# Patient Record
Sex: Female | Born: 1960
Health system: Southern US, Community
[De-identification: ages and names within clinical notes are randomized; demographics above are authoritative.]

## PROBLEM LIST (undated history)

## (undated) DIAGNOSIS — I10 Essential (primary) hypertension: Secondary | ICD-10-CM

## (undated) DIAGNOSIS — I351 Nonrheumatic aortic (valve) insufficiency: Secondary | ICD-10-CM

## (undated) DIAGNOSIS — M549 Dorsalgia, unspecified: Secondary | ICD-10-CM

## (undated) DIAGNOSIS — C801 Malignant (primary) neoplasm, unspecified: Secondary | ICD-10-CM

## (undated) DIAGNOSIS — G8929 Other chronic pain: Secondary | ICD-10-CM

## (undated) DIAGNOSIS — E785 Hyperlipidemia, unspecified: Secondary | ICD-10-CM

## (undated) DIAGNOSIS — R109 Unspecified abdominal pain: Secondary | ICD-10-CM

## (undated) DIAGNOSIS — F329 Major depressive disorder, single episode, unspecified: Secondary | ICD-10-CM

## (undated) DIAGNOSIS — I5189 Other ill-defined heart diseases: Secondary | ICD-10-CM

## (undated) DIAGNOSIS — J45909 Unspecified asthma, uncomplicated: Secondary | ICD-10-CM

## (undated) DIAGNOSIS — IMO0001 Reserved for inherently not codable concepts without codable children: Secondary | ICD-10-CM

## (undated) DIAGNOSIS — F32A Depression, unspecified: Secondary | ICD-10-CM

## (undated) DIAGNOSIS — M2550 Pain in unspecified joint: Secondary | ICD-10-CM

## (undated) DIAGNOSIS — K219 Gastro-esophageal reflux disease without esophagitis: Secondary | ICD-10-CM

## (undated) DIAGNOSIS — F419 Anxiety disorder, unspecified: Secondary | ICD-10-CM

## (undated) DIAGNOSIS — R002 Palpitations: Secondary | ICD-10-CM

## (undated) HISTORY — DX: Hyperlipidemia, unspecified: E78.5

## (undated) HISTORY — DX: Major depressive disorder, single episode, unspecified: F32.9

## (undated) HISTORY — DX: Essential (primary) hypertension: I10

## (undated) HISTORY — PX: LOBECTOMY: SHX5089

## (undated) HISTORY — DX: Nonrheumatic aortic (valve) insufficiency: I35.1

## (undated) HISTORY — PX: ABDOMINAL SURGERY: SHX537

## (undated) HISTORY — PX: TUBAL LIGATION: SHX77

## (undated) HISTORY — PX: ABDOMINAL HYSTERECTOMY: SHX81

## (undated) HISTORY — DX: Depression, unspecified: F32.A

## (undated) HISTORY — DX: Other ill-defined heart diseases: I51.89

## (undated) HISTORY — DX: Gastro-esophageal reflux disease without esophagitis: K21.9

## (undated) HISTORY — PX: COLONOSCOPY: SHX174

## (undated) HISTORY — PX: BLADDER SURGERY: SHX569

---

## 1995-01-19 DIAGNOSIS — C801 Malignant (primary) neoplasm, unspecified: Secondary | ICD-10-CM

## 1995-01-19 HISTORY — PX: LUNG CANCER SURGERY: SHX702

## 1995-01-19 HISTORY — DX: Malignant (primary) neoplasm, unspecified: C80.1

## 1995-10-19 ENCOUNTER — Encounter (INDEPENDENT_AMBULATORY_CARE_PROVIDER_SITE_OTHER): Payer: Self-pay | Admitting: *Deleted

## 1995-10-19 LAB — CONVERTED CEMR LAB

## 1997-05-17 ENCOUNTER — Encounter: Admission: RE | Admit: 1997-05-17 | Discharge: 1997-05-17 | Payer: Self-pay | Admitting: Family Medicine

## 1998-12-10 ENCOUNTER — Encounter: Admission: RE | Admit: 1998-12-10 | Discharge: 1998-12-10 | Payer: Self-pay | Admitting: Family Medicine

## 1999-04-02 ENCOUNTER — Encounter: Admission: RE | Admit: 1999-04-02 | Discharge: 1999-04-02 | Payer: Self-pay | Admitting: Sports Medicine

## 1999-04-02 ENCOUNTER — Encounter: Payer: Self-pay | Admitting: Sports Medicine

## 1999-04-20 ENCOUNTER — Encounter: Admission: RE | Admit: 1999-04-20 | Discharge: 1999-04-20 | Payer: Self-pay | Admitting: Family Medicine

## 1999-08-06 ENCOUNTER — Encounter: Admission: RE | Admit: 1999-08-06 | Discharge: 1999-08-06 | Payer: Self-pay | Admitting: *Deleted

## 1999-08-06 ENCOUNTER — Encounter: Admission: RE | Admit: 1999-08-06 | Discharge: 1999-08-06 | Payer: Self-pay | Admitting: Family Medicine

## 1999-08-06 ENCOUNTER — Encounter: Payer: Self-pay | Admitting: *Deleted

## 1999-10-08 ENCOUNTER — Encounter: Admission: RE | Admit: 1999-10-08 | Discharge: 1999-10-08 | Payer: Self-pay | Admitting: Family Medicine

## 1999-10-19 ENCOUNTER — Encounter: Admission: RE | Admit: 1999-10-19 | Discharge: 1999-10-19 | Payer: Self-pay | Admitting: Family Medicine

## 1999-10-22 ENCOUNTER — Encounter: Admission: RE | Admit: 1999-10-22 | Discharge: 1999-11-05 | Payer: Self-pay | Admitting: Sports Medicine

## 1999-11-20 ENCOUNTER — Encounter: Admission: RE | Admit: 1999-11-20 | Discharge: 1999-11-20 | Payer: Self-pay | Admitting: Family Medicine

## 1999-11-27 ENCOUNTER — Encounter: Admission: RE | Admit: 1999-11-27 | Discharge: 1999-11-27 | Payer: Self-pay | Admitting: Family Medicine

## 2000-05-25 ENCOUNTER — Encounter: Admission: RE | Admit: 2000-05-25 | Discharge: 2000-05-25 | Payer: Self-pay | Admitting: Family Medicine

## 2000-06-23 ENCOUNTER — Ambulatory Visit (HOSPITAL_COMMUNITY): Admission: RE | Admit: 2000-06-23 | Discharge: 2000-06-23 | Payer: Self-pay | Admitting: Urology

## 2000-08-09 ENCOUNTER — Ambulatory Visit (HOSPITAL_COMMUNITY): Admission: RE | Admit: 2000-08-09 | Discharge: 2000-08-09 | Payer: Self-pay | Admitting: Urology

## 2000-10-05 ENCOUNTER — Inpatient Hospital Stay (HOSPITAL_COMMUNITY): Admission: RE | Admit: 2000-10-05 | Discharge: 2000-10-06 | Payer: Self-pay | Admitting: Urology

## 2001-02-22 ENCOUNTER — Encounter: Admission: RE | Admit: 2001-02-22 | Discharge: 2001-02-22 | Payer: Self-pay | Admitting: Family Medicine

## 2001-03-27 ENCOUNTER — Observation Stay (HOSPITAL_COMMUNITY): Admission: RE | Admit: 2001-03-27 | Discharge: 2001-03-28 | Payer: Self-pay | Admitting: Urology

## 2001-12-24 ENCOUNTER — Emergency Department (HOSPITAL_COMMUNITY): Admission: EM | Admit: 2001-12-24 | Discharge: 2001-12-24 | Payer: Self-pay | Admitting: *Deleted

## 2002-01-27 ENCOUNTER — Encounter: Payer: Self-pay | Admitting: Emergency Medicine

## 2002-01-27 ENCOUNTER — Emergency Department (HOSPITAL_COMMUNITY): Admission: EM | Admit: 2002-01-27 | Discharge: 2002-01-27 | Payer: Self-pay | Admitting: Emergency Medicine

## 2002-02-06 ENCOUNTER — Encounter: Admission: RE | Admit: 2002-02-06 | Discharge: 2002-02-06 | Payer: Self-pay | Admitting: Family Medicine

## 2002-05-08 ENCOUNTER — Encounter: Admission: RE | Admit: 2002-05-08 | Discharge: 2002-05-08 | Payer: Self-pay | Admitting: Sports Medicine

## 2002-05-08 ENCOUNTER — Encounter: Payer: Self-pay | Admitting: Sports Medicine

## 2002-05-28 ENCOUNTER — Emergency Department (HOSPITAL_COMMUNITY): Admission: EM | Admit: 2002-05-28 | Discharge: 2002-05-28 | Payer: Self-pay | Admitting: *Deleted

## 2002-05-28 ENCOUNTER — Encounter: Payer: Self-pay | Admitting: *Deleted

## 2002-09-24 ENCOUNTER — Emergency Department (HOSPITAL_COMMUNITY): Admission: EM | Admit: 2002-09-24 | Discharge: 2002-09-24 | Payer: Self-pay | Admitting: Emergency Medicine

## 2002-09-25 ENCOUNTER — Emergency Department (HOSPITAL_COMMUNITY): Admission: EM | Admit: 2002-09-25 | Discharge: 2002-09-25 | Payer: Self-pay | Admitting: Emergency Medicine

## 2002-09-27 ENCOUNTER — Encounter (HOSPITAL_COMMUNITY): Admission: RE | Admit: 2002-09-27 | Discharge: 2002-10-27 | Payer: Self-pay | Admitting: Emergency Medicine

## 2002-09-27 ENCOUNTER — Emergency Department (HOSPITAL_COMMUNITY): Admission: EM | Admit: 2002-09-27 | Discharge: 2002-09-27 | Payer: Self-pay | Admitting: Emergency Medicine

## 2003-01-04 ENCOUNTER — Encounter: Admission: RE | Admit: 2003-01-04 | Discharge: 2003-01-04 | Payer: Self-pay | Admitting: Sports Medicine

## 2003-01-31 ENCOUNTER — Encounter: Admission: RE | Admit: 2003-01-31 | Discharge: 2003-01-31 | Payer: Self-pay | Admitting: Sports Medicine

## 2003-05-21 ENCOUNTER — Emergency Department (HOSPITAL_COMMUNITY): Admission: EM | Admit: 2003-05-21 | Discharge: 2003-05-21 | Payer: Self-pay | Admitting: Emergency Medicine

## 2003-05-30 ENCOUNTER — Encounter: Admission: RE | Admit: 2003-05-30 | Discharge: 2003-05-30 | Payer: Self-pay | Admitting: Family Medicine

## 2003-05-31 ENCOUNTER — Ambulatory Visit (HOSPITAL_COMMUNITY): Admission: RE | Admit: 2003-05-31 | Discharge: 2003-05-31 | Payer: Self-pay | Admitting: Sports Medicine

## 2003-06-14 ENCOUNTER — Encounter: Admission: RE | Admit: 2003-06-14 | Discharge: 2003-06-14 | Payer: Self-pay | Admitting: Sports Medicine

## 2003-06-14 ENCOUNTER — Encounter: Admission: RE | Admit: 2003-06-14 | Discharge: 2003-06-14 | Payer: Self-pay | Admitting: Family Medicine

## 2003-06-14 LAB — CONVERTED CEMR LAB: Cholesterol: 199 mg/dL

## 2003-07-30 ENCOUNTER — Emergency Department (HOSPITAL_COMMUNITY): Admission: EM | Admit: 2003-07-30 | Discharge: 2003-07-30 | Payer: Self-pay | Admitting: Emergency Medicine

## 2003-09-12 ENCOUNTER — Encounter: Admission: RE | Admit: 2003-09-12 | Discharge: 2003-09-12 | Payer: Self-pay | Admitting: Sports Medicine

## 2004-01-31 ENCOUNTER — Ambulatory Visit: Payer: Self-pay | Admitting: Sports Medicine

## 2004-03-23 ENCOUNTER — Emergency Department (HOSPITAL_COMMUNITY): Admission: EM | Admit: 2004-03-23 | Discharge: 2004-03-23 | Payer: Self-pay | Admitting: Emergency Medicine

## 2004-04-02 ENCOUNTER — Ambulatory Visit: Payer: Self-pay | Admitting: Family Medicine

## 2004-04-29 ENCOUNTER — Encounter: Admission: RE | Admit: 2004-04-29 | Discharge: 2004-04-29 | Payer: Self-pay | Admitting: Surgery

## 2004-05-05 ENCOUNTER — Encounter: Admission: RE | Admit: 2004-05-05 | Discharge: 2004-05-05 | Payer: Self-pay | Admitting: Surgery

## 2004-05-28 ENCOUNTER — Ambulatory Visit: Payer: Self-pay | Admitting: Family Medicine

## 2004-06-22 ENCOUNTER — Emergency Department (HOSPITAL_COMMUNITY): Admission: EM | Admit: 2004-06-22 | Discharge: 2004-06-22 | Payer: Self-pay | Admitting: Emergency Medicine

## 2004-06-25 ENCOUNTER — Ambulatory Visit: Payer: Self-pay | Admitting: Sports Medicine

## 2004-06-25 ENCOUNTER — Encounter: Admission: RE | Admit: 2004-06-25 | Discharge: 2004-06-25 | Payer: Self-pay | Admitting: Sports Medicine

## 2004-07-02 ENCOUNTER — Ambulatory Visit: Payer: Self-pay | Admitting: Family Medicine

## 2004-07-02 ENCOUNTER — Inpatient Hospital Stay (HOSPITAL_COMMUNITY): Admission: AD | Admit: 2004-07-02 | Discharge: 2004-07-04 | Payer: Self-pay | Admitting: Family Medicine

## 2004-07-07 ENCOUNTER — Ambulatory Visit: Payer: Self-pay | Admitting: Family Medicine

## 2004-07-16 ENCOUNTER — Ambulatory Visit (HOSPITAL_COMMUNITY): Admission: RE | Admit: 2004-07-16 | Discharge: 2004-07-16 | Payer: Self-pay | Admitting: Gastroenterology

## 2004-08-17 ENCOUNTER — Ambulatory Visit (HOSPITAL_COMMUNITY): Admission: RE | Admit: 2004-08-17 | Discharge: 2004-08-17 | Payer: Self-pay | Admitting: Gastroenterology

## 2004-10-15 ENCOUNTER — Ambulatory Visit (HOSPITAL_COMMUNITY): Admission: RE | Admit: 2004-10-15 | Discharge: 2004-10-15 | Payer: Self-pay | Admitting: Gastroenterology

## 2005-01-25 ENCOUNTER — Emergency Department (HOSPITAL_COMMUNITY): Admission: EM | Admit: 2005-01-25 | Discharge: 2005-01-25 | Payer: Self-pay | Admitting: Emergency Medicine

## 2005-01-28 ENCOUNTER — Encounter (INDEPENDENT_AMBULATORY_CARE_PROVIDER_SITE_OTHER): Payer: Self-pay | Admitting: *Deleted

## 2005-01-28 ENCOUNTER — Inpatient Hospital Stay (HOSPITAL_COMMUNITY): Admission: RE | Admit: 2005-01-28 | Discharge: 2005-01-30 | Payer: Self-pay | Admitting: *Deleted

## 2005-03-09 ENCOUNTER — Ambulatory Visit: Payer: Self-pay | Admitting: Sports Medicine

## 2005-08-30 ENCOUNTER — Emergency Department (HOSPITAL_COMMUNITY): Admission: EM | Admit: 2005-08-30 | Discharge: 2005-08-30 | Payer: Self-pay | Admitting: Emergency Medicine

## 2005-09-16 ENCOUNTER — Inpatient Hospital Stay (HOSPITAL_COMMUNITY): Admission: RE | Admit: 2005-09-16 | Discharge: 2005-09-18 | Payer: Self-pay | Admitting: Obstetrics and Gynecology

## 2005-09-16 ENCOUNTER — Encounter (INDEPENDENT_AMBULATORY_CARE_PROVIDER_SITE_OTHER): Payer: Self-pay | Admitting: *Deleted

## 2005-09-25 ENCOUNTER — Emergency Department (HOSPITAL_COMMUNITY): Admission: EM | Admit: 2005-09-25 | Discharge: 2005-09-25 | Payer: Self-pay | Admitting: Emergency Medicine

## 2005-11-12 ENCOUNTER — Ambulatory Visit: Payer: Self-pay | Admitting: Family Medicine

## 2006-02-21 ENCOUNTER — Emergency Department (HOSPITAL_COMMUNITY): Admission: EM | Admit: 2006-02-21 | Discharge: 2006-02-21 | Payer: Self-pay | Admitting: Emergency Medicine

## 2006-03-09 ENCOUNTER — Encounter: Payer: Self-pay | Admitting: Family Medicine

## 2006-03-09 ENCOUNTER — Ambulatory Visit: Payer: Self-pay | Admitting: Family Medicine

## 2006-03-09 LAB — CONVERTED CEMR LAB
ALT: 25 units/L (ref 0–35)
CO2: 31 meq/L (ref 19–32)
Calcium: 9.5 mg/dL (ref 8.4–10.5)
Chloride: 100 meq/L (ref 96–112)
Creatinine, Ser: 0.75 mg/dL (ref 0.40–1.20)
Glucose, Bld: 68 mg/dL — ABNORMAL LOW (ref 70–99)
Rhuematoid fact SerPl-aCnc: <20 intl units/mL (ref 0–20)
Total Protein: 7.6 g/dL (ref 6.0–8.3)

## 2006-03-17 DIAGNOSIS — G4701 Insomnia due to medical condition: Secondary | ICD-10-CM | POA: Insufficient documentation

## 2006-03-17 DIAGNOSIS — I10 Essential (primary) hypertension: Secondary | ICD-10-CM | POA: Insufficient documentation

## 2006-03-17 DIAGNOSIS — E7800 Pure hypercholesterolemia, unspecified: Secondary | ICD-10-CM | POA: Insufficient documentation

## 2006-03-17 DIAGNOSIS — M25559 Pain in unspecified hip: Secondary | ICD-10-CM | POA: Insufficient documentation

## 2006-03-17 DIAGNOSIS — K219 Gastro-esophageal reflux disease without esophagitis: Secondary | ICD-10-CM | POA: Insufficient documentation

## 2006-03-17 DIAGNOSIS — F339 Major depressive disorder, recurrent, unspecified: Secondary | ICD-10-CM | POA: Insufficient documentation

## 2006-03-18 ENCOUNTER — Encounter (INDEPENDENT_AMBULATORY_CARE_PROVIDER_SITE_OTHER): Payer: Self-pay | Admitting: *Deleted

## 2006-03-24 ENCOUNTER — Telehealth: Payer: Self-pay | Admitting: Family Medicine

## 2006-04-19 ENCOUNTER — Telehealth: Payer: Self-pay | Admitting: Family Medicine

## 2006-04-21 ENCOUNTER — Telehealth: Payer: Self-pay | Admitting: *Deleted

## 2006-04-22 ENCOUNTER — Telehealth: Payer: Self-pay | Admitting: *Deleted

## 2006-04-25 ENCOUNTER — Telehealth: Payer: Self-pay | Admitting: Family Medicine

## 2006-06-17 ENCOUNTER — Telehealth: Payer: Self-pay | Admitting: Family Medicine

## 2006-07-13 ENCOUNTER — Ambulatory Visit: Payer: Self-pay | Admitting: Family Medicine

## 2006-07-13 ENCOUNTER — Telehealth: Payer: Self-pay | Admitting: Family Medicine

## 2006-07-13 DIAGNOSIS — M159 Polyosteoarthritis, unspecified: Secondary | ICD-10-CM | POA: Insufficient documentation

## 2006-08-01 ENCOUNTER — Telehealth: Payer: Self-pay | Admitting: *Deleted

## 2006-08-30 ENCOUNTER — Encounter: Payer: Self-pay | Admitting: *Deleted

## 2006-09-02 ENCOUNTER — Emergency Department (HOSPITAL_COMMUNITY): Admission: EM | Admit: 2006-09-02 | Discharge: 2006-09-02 | Payer: Self-pay | Admitting: Emergency Medicine

## 2006-09-19 ENCOUNTER — Emergency Department (HOSPITAL_COMMUNITY): Admission: EM | Admit: 2006-09-19 | Discharge: 2006-09-19 | Payer: Self-pay | Admitting: Emergency Medicine

## 2006-09-21 ENCOUNTER — Telehealth: Payer: Self-pay | Admitting: *Deleted

## 2006-10-06 ENCOUNTER — Encounter (INDEPENDENT_AMBULATORY_CARE_PROVIDER_SITE_OTHER): Payer: Self-pay | Admitting: *Deleted

## 2006-10-31 ENCOUNTER — Telehealth: Payer: Self-pay | Admitting: *Deleted

## 2006-11-01 ENCOUNTER — Encounter: Payer: Self-pay | Admitting: Family Medicine

## 2006-11-15 ENCOUNTER — Encounter: Payer: Self-pay | Admitting: Family Medicine

## 2006-11-21 ENCOUNTER — Encounter: Payer: Self-pay | Admitting: Family Medicine

## 2007-01-06 ENCOUNTER — Encounter: Payer: Self-pay | Admitting: Family Medicine

## 2007-02-07 ENCOUNTER — Telehealth (INDEPENDENT_AMBULATORY_CARE_PROVIDER_SITE_OTHER): Payer: Self-pay | Admitting: Family Medicine

## 2007-02-27 ENCOUNTER — Telehealth: Payer: Self-pay | Admitting: Family Medicine

## 2007-03-07 ENCOUNTER — Emergency Department (HOSPITAL_COMMUNITY): Admission: EM | Admit: 2007-03-07 | Discharge: 2007-03-07 | Payer: Self-pay | Admitting: Emergency Medicine

## 2007-03-09 ENCOUNTER — Ambulatory Visit: Payer: Self-pay | Admitting: Family Medicine

## 2007-03-09 ENCOUNTER — Encounter: Payer: Self-pay | Admitting: Family Medicine

## 2007-03-09 LAB — CONVERTED CEMR LAB
ALT: 21 units/L (ref 0–35)
Bilirubin Urine: NEGATIVE
Chloride: 100 meq/L (ref 96–112)
Creatinine, Ser: 0.75 mg/dL (ref 0.40–1.20)
Glucose, Urine, Semiquant: NEGATIVE
Ketones, urine, test strip: NEGATIVE
MCHC: 32.5 g/dL (ref 30.0–36.0)
Potassium: 3.3 meq/L — ABNORMAL LOW (ref 3.5–5.3)
RBC: 4.28 M/uL (ref 3.87–5.11)
Sodium: 138 meq/L (ref 135–145)
Specific Gravity, Urine: >=1.03
Total Bilirubin: 0.4 mg/dL (ref 0.3–1.2)
Urobilinogen, UA: 0.2
WBC Urine, dipstick: NEGATIVE
pH: 6

## 2007-03-23 ENCOUNTER — Telehealth: Payer: Self-pay | Admitting: *Deleted

## 2007-03-28 ENCOUNTER — Encounter (INDEPENDENT_AMBULATORY_CARE_PROVIDER_SITE_OTHER): Payer: Self-pay | Admitting: *Deleted

## 2007-07-20 ENCOUNTER — Telehealth: Payer: Self-pay | Admitting: *Deleted

## 2007-10-23 ENCOUNTER — Telehealth: Payer: Self-pay | Admitting: *Deleted

## 2007-10-24 ENCOUNTER — Encounter: Payer: Self-pay | Admitting: Family Medicine

## 2007-10-24 ENCOUNTER — Ambulatory Visit: Payer: Self-pay | Admitting: Family Medicine

## 2007-10-24 LAB — CONVERTED CEMR LAB
Hemoglobin: 12.7 g/dL (ref 12.0–15.0)
MCHC: 31.8 g/dL (ref 30.0–36.0)

## 2007-11-01 ENCOUNTER — Ambulatory Visit (HOSPITAL_COMMUNITY): Admission: RE | Admit: 2007-11-01 | Discharge: 2007-11-01 | Payer: Self-pay | Admitting: Family Medicine

## 2007-12-12 ENCOUNTER — Telehealth: Payer: Self-pay | Admitting: Family Medicine

## 2008-06-15 ENCOUNTER — Emergency Department (HOSPITAL_COMMUNITY): Admission: EM | Admit: 2008-06-15 | Discharge: 2008-06-15 | Payer: Self-pay | Admitting: Emergency Medicine

## 2008-07-09 ENCOUNTER — Telehealth: Payer: Self-pay | Admitting: Family Medicine

## 2008-07-10 ENCOUNTER — Ambulatory Visit: Payer: Self-pay | Admitting: Family Medicine

## 2008-07-26 ENCOUNTER — Ambulatory Visit: Payer: Self-pay | Admitting: Family Medicine

## 2008-07-26 ENCOUNTER — Encounter: Admission: RE | Admit: 2008-07-26 | Discharge: 2008-07-26 | Payer: Self-pay | Admitting: Family Medicine

## 2008-10-09 ENCOUNTER — Encounter: Payer: Self-pay | Admitting: Family Medicine

## 2008-10-09 ENCOUNTER — Ambulatory Visit: Payer: Self-pay | Admitting: Family Medicine

## 2008-10-09 DIAGNOSIS — N951 Menopausal and female climacteric states: Secondary | ICD-10-CM | POA: Insufficient documentation

## 2008-10-09 DIAGNOSIS — M79609 Pain in unspecified limb: Secondary | ICD-10-CM | POA: Insufficient documentation

## 2008-10-09 DIAGNOSIS — C3490 Malignant neoplasm of unspecified part of unspecified bronchus or lung: Secondary | ICD-10-CM | POA: Insufficient documentation

## 2008-10-09 LAB — CONVERTED CEMR LAB
BUN: 9 mg/dL (ref 6–23)
CO2: 29 meq/L (ref 19–32)
Calcium: 9.2 mg/dL (ref 8.4–10.5)
Chloride: 100 meq/L (ref 96–112)
Potassium: 3.4 meq/L — ABNORMAL LOW (ref 3.5–5.3)
Rhuematoid fact SerPl-aCnc: 22 intl units/mL — ABNORMAL HIGH (ref 0–20)
TSH: 0.465 microintl units/mL (ref 0.350–4.500)

## 2008-10-10 ENCOUNTER — Encounter: Payer: Self-pay | Admitting: Family Medicine

## 2008-10-10 LAB — CONVERTED CEMR LAB: Sed Rate: 13 mm/hr (ref 0–22)

## 2008-10-11 ENCOUNTER — Telehealth: Payer: Self-pay | Admitting: *Deleted

## 2008-11-01 ENCOUNTER — Ambulatory Visit (HOSPITAL_COMMUNITY): Admission: RE | Admit: 2008-11-01 | Discharge: 2008-11-01 | Payer: Self-pay | Admitting: Family Medicine

## 2008-12-02 ENCOUNTER — Emergency Department (HOSPITAL_COMMUNITY): Admission: EM | Admit: 2008-12-02 | Discharge: 2008-12-02 | Payer: Self-pay | Admitting: Emergency Medicine

## 2008-12-04 ENCOUNTER — Ambulatory Visit: Payer: Self-pay | Admitting: Family Medicine

## 2008-12-04 DIAGNOSIS — G4730 Sleep apnea, unspecified: Secondary | ICD-10-CM | POA: Insufficient documentation

## 2008-12-16 ENCOUNTER — Ambulatory Visit: Admission: RE | Admit: 2008-12-16 | Discharge: 2008-12-16 | Payer: Self-pay | Admitting: Family Medicine

## 2008-12-16 ENCOUNTER — Encounter: Payer: Self-pay | Admitting: Family Medicine

## 2008-12-18 ENCOUNTER — Telehealth: Payer: Self-pay | Admitting: Family Medicine

## 2009-10-04 ENCOUNTER — Emergency Department (HOSPITAL_COMMUNITY): Admission: EM | Admit: 2009-10-04 | Discharge: 2009-10-04 | Payer: Self-pay | Admitting: Emergency Medicine

## 2009-10-20 ENCOUNTER — Ambulatory Visit: Payer: Self-pay | Admitting: Family Medicine

## 2009-11-03 ENCOUNTER — Ambulatory Visit (HOSPITAL_COMMUNITY): Admission: RE | Admit: 2009-11-03 | Discharge: 2009-11-03 | Payer: Self-pay | Admitting: Family Medicine

## 2009-11-05 ENCOUNTER — Telehealth: Payer: Self-pay | Admitting: *Deleted

## 2009-12-01 ENCOUNTER — Emergency Department (HOSPITAL_COMMUNITY): Admission: EM | Admit: 2009-12-01 | Discharge: 2009-12-01 | Payer: Self-pay | Admitting: Emergency Medicine

## 2009-12-17 ENCOUNTER — Encounter: Payer: Self-pay | Admitting: Family Medicine

## 2010-01-14 ENCOUNTER — Encounter (INDEPENDENT_AMBULATORY_CARE_PROVIDER_SITE_OTHER): Payer: Self-pay | Admitting: *Deleted

## 2010-02-11 ENCOUNTER — Telehealth: Payer: Self-pay | Admitting: *Deleted

## 2010-02-19 NOTE — Miscellaneous (Signed)
Summary: request fom DDS  Clinical Lists Changes DDS request medical records from 11/2009 to present but, Pt doesn't have any OV during that time. [name]     Faxed DDS back stating pt does not have any OV notes for DOS requested. Marland Kitchen

## 2010-02-19 NOTE — Progress Notes (Signed)
Summary: meds prob  Phone Note Call from Patient Call back at Home Phone [phone]   Caller: Patient Summary of Call: inhaler for PROAIR HFA 108 cost too must needs something cheaper [facility] [place] Initial call taken by: De Nurse,  November 05, 2009 9:04 AM  Follow-up for Phone Call        to pcp Follow-up by: Golden Circle RN,  November 05, 2009 9:08 AM  Additional Follow-up for Phone Call Additional follow up Details #1::        no low cost alternative. we have a coupon she can use Additional Follow-up by: Golden Circle RN,  November 06, 2009 7:20 AM    Additional Follow-up for Phone Call Additional follow up Details #2::    told he no alternatives. sh wants to couponn mailed to her since she lives in [place] & cannot afford the gas money to come get it. Follow-up by: Golden Circle RN,  November 06, 2009 9:49 AM

## 2010-02-19 NOTE — Miscellaneous (Signed)
Summary: Handicapped Placard  Form dropped off for Handicapped placard.   Please mail when completed. Bradly Bienenstock  December 17, 2009 1:20 PM  Handicapped Placard placed in Dr. Philis Pique box for signature.......Marland Kitchen Terese Door  December 17, 2009 1:34 PM  Completed. Helane Rima DO  December 19, 2009 8:30 AM

## 2010-02-19 NOTE — Assessment & Plan Note (Signed)
Summary: f/u pain,df   Vital Signs:  Patient profile:   50 year old female Height:      68 inches Weight:      184 pounds BMI:     28.08 Temp:     98.1 degrees F oral Pulse rate:   77 / minute BP sitting:   122 / 77  (right arm) Cuff size:   regular  Vitals Entered By: Tessie Fass CMA (October 20, 2009 11:00 AM) CC: F/U pain Is Patient Diabetic? No Pain Assessment Patient in pain? yes     Location: right hand, elbow and bilateral knee Intensity: 9   Primary Care Provider:  Everrett Coombe DO  CC:  F/U pain.  History of Present Illness: 50 yo F:  1. Pain: Mulitple joints, especially right hand, right elbow, and bilateral knees. x a few years, chronic low grade pain with intermittent flares. No trauma to any joint. No swelling or erythema. Patient's sister with her today - she obviously has RA bilateral hands with ulnar deviation. Patient using Tylenol for pain. Right handed. Denies fever/chills, N/V/D, HA, rash.  Current Medications (verified): 1)  Amitriptyline Hcl 150 Mg  Tabs (Amitriptyline Hcl) .... Take 1 By Mouth At Bedtime 2)  Citracal + D 250-200 Mg-Unit Tabs (Calcium Citrate-Vitamin D) .... Take 1 Tablet By Mouth Once A Day 3)  Hydrochlorothiazide 25 Mg Tabs (Hydrochlorothiazide) .... Take One Tablet By Mouth Once A Day 4)  Sertraline Hcl 100 Mg  Tabs (Sertraline Hcl) .... Take One By Mouth Daily 5)  Pantoprazole Sodium 40 Mg  Tbec (Pantoprazole Sodium) .... Take One Daily 6)  Proair Hfa 108 (90 Base) Mcg/act Aers (Albuterol Sulfate) .... 2 P Every 4 Hours As Needed For Breathing 7)  Meloxicam 15 Mg Tabs (Meloxicam) .... One By Mouth Daily For Everyday Arthritis Pain 8)  Tramadol Hcl 50 Mg  Tabs (Tramadol Hcl) .... 1/2 By Mouth Daily As Needed For Severe Arthritis Pain  Allergies (verified): 1)  ! Phenergan PMH-FH-SH reviewed for relevance  Review of Systems      See HPI  Physical Exam  General:  Well-developed, well-nourished, in no acute distress; alert,  appropriate and cooperative throughout examination. Vitals reviewed. Msk:  Right Hand: No obvious edema. No erythema. No deformity. TTP along thumb MCP joint. Decreased fist motion 2/2 pain. Negative Finkelstein. Left Hand: WNL. Bilateral Knee: No edema or obvious abnormality. Minimal crepitus on flexion/extension. Normal ROM. Normal strength. Normal endpoints.  Pulses:  2 + DP. 2+ radial. Extremities:  No edema. Neurologic:  Strength normal in all extremities, sensation intact to light touch, and gait normal.   Skin:  Intact without suspicious lesions or rashes.   Impression & Recommendations:  Problem # 1:  OSTEOARTHROSIS, MULTIPLE SITES, NOT GNRLZD (ICD-715.89) Assessment Deteriorated  Rx Mobic as needed for baseline pain control with Rx Ultram as needed for severe pain. Discussed need to space out Tramadol while on SSRI. Will need to call if she feels that Mobic is not controlling the pain. Her updated medication list for this problem includes:    Meloxicam 15 Mg Tabs (Meloxicam) ..... One by mouth daily for everyday arthritis pain    Tramadol Hcl 50 Mg Tabs (Tramadol hcl) .Marland Kitchen... 1/2 by mouth daily as needed for severe arthritis pain  Orders: [facility]- Est Level  3 (04540)  Problem # 2:  HYPERTENSION, BENIGN SYSTEMIC (ICD-401.1) Assessment: Improved  Refilled medications. NOTE: Patient requested refills of medications today. Refilled with instructions for patient to follow-up with PCP  for monitoring of chronic medical conditions.  Her updated medication list for this problem includes:    Hydrochlorothiazide 25 Mg Tabs (Hydrochlorothiazide) .Marland Kitchen... Take one tablet by mouth once a day  Orders: [facility]- Est Level  3 (60454)  Complete Medication List: 1)  Amitriptyline Hcl 150 Mg Tabs (Amitriptyline hcl) .... Take 1 by mouth at bedtime 2)  Citracal + D 250-200 Mg-unit Tabs (Calcium citrate-vitamin d) .... Take 1 tablet by mouth once a day 3)  Hydrochlorothiazide 25 Mg Tabs  (Hydrochlorothiazide) .... Take one tablet by mouth once a day 4)  Sertraline Hcl 100 Mg Tabs (Sertraline hcl) .... Take one by mouth daily 5)  Pantoprazole Sodium 40 Mg Tbec (Pantoprazole sodium) .... Take one daily 6)  Proair Hfa 108 (90 Base) Mcg/act Aers (Albuterol sulfate) .... 2 p every 4 hours as needed for breathing 7)  Meloxicam 15 Mg Tabs (Meloxicam) .... One by mouth daily for everyday arthritis pain 8)  Tramadol Hcl 50 Mg Tabs (Tramadol hcl) .... 1/2 by mouth daily as needed for severe arthritis pain  Patient Instructions: 1)  Follow up with your PCP to monitor your chronic medical conditions. Prescriptions: PROAIR HFA 108 (90 BASE) MCG/ACT AERS (ALBUTEROL SULFATE) 2 p every 4 hours as needed for breathing  #1 x 11   Entered and Authorized by:   Helane Rima DO   Signed by:   Helane Rima DO on 10/20/2009   Method used:   Electronically to        [facility]  [address] (retail)       [address]       [place], [place]  [address]       Ph: [phone]       Fax: [phone]   RxID:   [id number] PANTOPRAZOLE SODIUM 40 MG  TBEC (PANTOPRAZOLE SODIUM) take one daily  #30 x 3   Entered and Authorized by:   Helane Rima DO   Signed by:   Helane Rima DO on 10/20/2009   Method used:   Electronically to        [facility]  [address] (retail)       [address]       [place], [place]  [address]       Ph: [phone]       Fax: [phone]   RxID:   [id number] SERTRALINE HCL 100 MG  TABS (SERTRALINE HCL) take one by mouth daily  #90 x 3   Entered and Authorized by:   Helane Rima DO   Signed by:   Helane Rima DO on 10/20/2009   Method used:   Electronically to        [facility]  [address] (retail)       [address]       [place], [place]  [address]       Ph: [phone]       Fax: [phone]   RxID:   [id number] HYDROCHLOROTHIAZIDE 25 MG TABS (HYDROCHLOROTHIAZIDE) Take one tablet by mouth once a day   #90 x 3   Entered and Authorized by:   Helane Rima DO   Signed by:   Helane Rima DO on 10/20/2009   Method used:   Electronically to        [facility]  [address] (retail)       [phone]       [place], [place]  [address]       Ph: [phone]       Fax: [phone]   RxID:   [id number] CITRACAL + D 250-200 MG-UNIT TABS (CALCIUM CITRATE-VITAMIN D) Take 1 tablet by mouth once a day  #30 x 3   Entered and Authorized by:   Helane Rima DO   Signed by:   Helane Rima DO on 10/20/2009   Method used:   Electronically to        [facility]  [address] (retail)       [address]       [place], [place]  [address]       Ph: [phone]       Fax: [phone]   RxID:   [id number] AMITRIPTYLINE HCL 150 MG  TABS (AMITRIPTYLINE HCL) take 1 by mouth at bedtime  #90 x 3   Entered and Authorized by:   Helane Rima DO   Signed by:   Helane Rima DO on 10/20/2009   Method used:   Electronically to        [facility]  [address] (retail)       [address]       [place], [place]  [address]       Ph: [phone]       Fax: [phone]   RxID:   [id number] TRAMADOL HCL 50 MG  TABS (TRAMADOL HCL) 1/2 by mouth daily as needed for SEVERE arthritis pain  #30 x 1   Entered and Authorized by:   Helane Rima DO   Signed by:   Helane Rima DO on 10/20/2009   Method used:   Electronically to        [facility]  [address] (retail)       [address]       [place], [place]  [address]       Ph: [phone]       Fax: [phone]   RxID:   [id number] MELOXICAM 15 MG TABS (MELOXICAM) one by mouth daily for everyday arthritis pain  #30 x 11   Entered and Authorized by:   Helane Rima DO   Signed by:   Helane Rima DO on 10/20/2009   Method used:   Electronically to        [facility]  [address] (retail)       [address]       [place], [place]  [address]       Ph: [phone]       Fax:  [phone]   RxID:   [id number]   Prevention & Chronic Care Immunizations   Influenza vaccine: Fluvax Non-MCR  (10/24/2007)   Influenza vaccine deferral: Deferred  (10/20/2009)   Influenza vaccine due: 10/23/2008    Tetanus booster: 09/19/2002: Done.   Tetanus booster due: 09/18/2012    Pneumococcal vaccine: Not documented  Other Screening   Pap smear: Done.  (10/19/1995)   Pap smear due: 10/18/1996    Mammogram: ASSESSMENT: Negative - BI-RADS 1^MM DIGITAL SCREENING  (11/01/2008)   Mammogram due: 11/02/2010   Smoking status: quit  (12/04/2008)  Lipids   Total Cholesterol: 199  (06/14/2003)   LDL: 146  (06/14/2003)  LDL Direct: Not documented   HDL: 36  (06/14/2003)   Triglycerides: Not documented    SGOT (AST): 19  (03/09/2007)   SGPT (ALT): 21  (03/09/2007)   Alkaline phosphatase: 125  (03/09/2007)   Total bilirubin: 0.4  (03/09/2007)    Lipid flowsheet reviewed?: Yes   Progress toward LDL goal: Unchanged  Hypertension   Last Blood Pressure: 122 / 77  (10/20/2009)   Serum creatinine: 0.92  (10/09/2008)   Serum potassium 3.4  (10/09/2008)    Hypertension flowsheet reviewed?: Yes   Progress toward BP goal: At goal  Self-Management Support :   Personal Goals (by the next clinic visit) :      Personal blood pressure goal: 140/90  (10/09/2008)     Personal LDL goal: 130  (10/09/2008)    Patient will work on the following items until the next clinic visit to reach self-care goals:     Medications and monitoring: take my medicines every day, bring all of my medications to every visit  (10/20/2009)     Eating: drink diet soda or water instead of juice or soda, eat more vegetables, use fresh or frozen vegetables, eat foods that are low in salt, eat baked foods instead of fried foods, eat fruit for snacks and desserts, limit or avoid alcohol  (10/20/2009)     Activity: take a 30 minute walk every day, take the stairs instead of the elevator, park at the far end  of the parking lot  (10/20/2009)    Hypertension self-management support: Written self-care plan  (10/20/2009)   Hypertension self-care plan printed.    Lipid self-management support: Written self-care plan  (10/20/2009)   Lipid self-care plan printed.

## 2010-02-23 ENCOUNTER — Ambulatory Visit: Payer: Self-pay | Admitting: Family Medicine

## 2010-02-25 NOTE — Progress Notes (Signed)
Summary: meds prob  Phone Note Refill Request Call back at [phone] Message from:  Patient  Refills Requested: Medication #1:  SERTRALINE HCL 100 MG  TABS take one by mouth daily   Notes: needs something different that is on the $4 list also wants to know what she can buy OTC for acid reflux [facility] [place]  Initial call taken by: De Nurse,  February 11, 2010 8:33 AM  Follow-up for Phone Call        to preceptor as pcp is not here today Follow-up by: Golden Circle RN,  February 11, 2010 9:35 AM  Additional Follow-up for Phone Call Additional follow up Details #1::        Pt should come in and discuss this with her primary, and we also need to follow up her chronic medical conditions.  Please have her schedule an appt with Dr. Ashley Royalty.  She can try some ranitidine in the meantime for her reflux.      Additional Follow-up for Phone Call Additional follow up Details #2::    lm with spouse asking that she call us when she returns Follow-up by: Golden Circle RN,  February 11, 2010 2:27 PM

## 2010-04-07 ENCOUNTER — Encounter: Payer: Self-pay | Admitting: Family Medicine

## 2010-04-07 ENCOUNTER — Ambulatory Visit (INDEPENDENT_AMBULATORY_CARE_PROVIDER_SITE_OTHER): Payer: Self-pay | Admitting: Family Medicine

## 2010-04-07 VITALS — BP 130/80 | HR 86 | Temp 98.0°F | Ht 66.0 in | Wt 189.0 lb

## 2010-04-07 DIAGNOSIS — M159 Polyosteoarthritis, unspecified: Secondary | ICD-10-CM

## 2010-04-07 DIAGNOSIS — K5900 Constipation, unspecified: Secondary | ICD-10-CM | POA: Insufficient documentation

## 2010-04-07 MED ORDER — IBUPROFEN 600 MG PO TABS: 600.0000 mg | ORAL_TABLET | Freq: Three times a day (TID) | ORAL | Status: AC | PRN

## 2010-04-07 MED ORDER — TRAMADOL HCL 50 MG PO TABS: ORAL_TABLET | ORAL | Status: DC

## 2010-04-07 MED ORDER — POLYETHYLENE GLYCOL 3350 17 GM/SCOOP PO POWD: 17.0000 g | Freq: Every day | ORAL | Status: DC

## 2010-04-07 NOTE — Assessment & Plan Note (Signed)
Poorly conditioned and age of osteoarthritis.  Diagnosis made as far back as 2008.  Discussed exercises, reduction of process foods, and sugar in diet.  May use ibuprofen, added Miralax for her perceived assoicated constipation.  May use higher dose of tramadol for severe pain.  Discussed that she is on a number of meds that increase serotonin and if she would feel tremors or stiffness she should stop tramadol.

## 2010-04-07 NOTE — Patient Instructions (Addendum)
Improve overall health through exercise and diet Gradually stop the [facility] and replace with water, lite lemonade Use the ibuprofen 600 mg tid as needed this will help the arthritis pain the best because it acts on inflammation, since it causes constipation I have ordered a powder that will help with this Use the tramadol very carefully, for severe pain Use ice, heat, baths

## 2010-04-07 NOTE — Assessment & Plan Note (Signed)
Recommended diet change, polyeth glycol prn

## 2010-04-07 NOTE — Progress Notes (Signed)
  Subjective:    Patient ID: [name], female    DOB: Feb 05, 1960, 50 y.o.   MRN: [id number]  HPI Reports multiple sites of joint pain, knees, shoulders, occasionally hips.  Denies swelling or redness.  Tramadol 1/2 does nothing for her pain.  She feels that she needs both the Zoloft and Elavil to treat her depression that she believes cause her lung cancer. One year ago had normal sed rate and normal RA factor.  Ibuprofen helps but she believes it causes constipation.    Drinks 2 liters of [employer] daily, eats a lot of processed foods.   Review of Systems  Constitutional: Negative for fever, activity change and fatigue.  Respiratory: Negative for cough.   Cardiovascular: Negative for chest pain.  Gastrointestinal: Positive for constipation.  Musculoskeletal: Positive for arthralgias. Negative for myalgias, back pain, joint swelling and gait problem.  Skin: Negative for rash.  Psychiatric/Behavioral: Positive for dysphoric mood.       Objective:   Physical Exam  Constitutional: She is oriented to person, place, and time. No distress.  Neck: Normal range of motion. Neck supple.  Cardiovascular: Normal rate, regular rhythm and normal heart sounds.   Pulmonary/Chest: Effort normal and breath sounds normal.  Musculoskeletal:       No swelling, no redness or tenderness of specific joints.  Poor muscle tone in general.    Neurological: She is oriented to person, place, and time. She has normal reflexes. No cranial nerve deficit.  Skin: No rash noted.  Psychiatric: Her behavior is normal.          Assessment & Plan:

## 2010-04-22 LAB — DIFFERENTIAL
Basophils Absolute: 0 10*3/uL (ref 0.0–0.1)
Basophils Relative: 1 % (ref 0–1)
Eosinophils Relative: 2 % (ref 0–5)
Lymphocytes Relative: 34 % (ref 12–46)
Monocytes Absolute: 0.2 10*3/uL (ref 0.1–1.0)

## 2010-04-22 LAB — CK TOTAL AND CKMB (NOT AT ARMC)
CK, MB: 1.6 ng/mL (ref 0.3–4.0)
Relative Index: 0.6 (ref 0.0–2.5)

## 2010-04-22 LAB — CBC
Hemoglobin: 11.5 g/dL — ABNORMAL LOW (ref 12.0–15.0)
MCV: 93 fL (ref 78.0–100.0)
RDW: 13.9 % (ref 11.5–15.5)
WBC: 4.5 10*3/uL (ref 4.0–10.5)

## 2010-04-22 LAB — CULTURE, BLOOD (ROUTINE X 2)
Culture: NO GROWTH
Report Status: 11202010

## 2010-04-22 LAB — BASIC METABOLIC PANEL
BUN: 5 mg/dL — ABNORMAL LOW (ref 6–23)
Creatinine, Ser: 0.78 mg/dL (ref 0.4–1.2)
GFR calc non Af Amer: >60 mL/min (ref >60)
Potassium: 3.5 mEq/L (ref 3.5–5.1)
Sodium: 136 mEq/L (ref 135–145)

## 2010-04-22 LAB — BRAIN NATRIURETIC PEPTIDE: Pro B Natriuretic peptide (BNP): <30 pg/mL (ref 0.0–100.0)

## 2010-04-22 LAB — TROPONIN I: Troponin I: 0.03 ng/mL (ref 0.00–0.06)

## 2010-04-28 LAB — DIFFERENTIAL
Basophils Absolute: 0 10*3/uL (ref 0.0–0.1)
Basophils Relative: 0 % (ref 0–1)
Eosinophils Absolute: 0.2 10*3/uL (ref 0.0–0.7)
Monocytes Absolute: 0.2 10*3/uL (ref 0.1–1.0)
Monocytes Relative: 5 % (ref 3–12)
Neutrophils Relative %: 59 % (ref 43–77)

## 2010-04-28 LAB — URINALYSIS, ROUTINE W REFLEX MICROSCOPIC
Bilirubin Urine: NEGATIVE
Glucose, UA: NEGATIVE mg/dL
Ketones, ur: NEGATIVE mg/dL
Leukocytes, UA: NEGATIVE
Nitrite: NEGATIVE
Protein, ur: NEGATIVE mg/dL
Specific Gravity, Urine: <1.005 — ABNORMAL LOW (ref 1.005–1.030)
Urobilinogen, UA: 0.2 mg/dL (ref 0.0–1.0)
pH: 7 (ref 5.0–8.0)

## 2010-04-28 LAB — COMPREHENSIVE METABOLIC PANEL
ALT: 40 U/L — ABNORMAL HIGH (ref 0–35)
Albumin: 3.7 g/dL (ref 3.5–5.2)
Alkaline Phosphatase: 132 U/L — ABNORMAL HIGH (ref 39–117)
Chloride: 100 mEq/L (ref 96–112)
Glucose, Bld: 108 mg/dL — ABNORMAL HIGH (ref 70–99)
Potassium: 3.7 mEq/L (ref 3.5–5.1)
Sodium: 138 mEq/L (ref 135–145)
Total Bilirubin: 0.4 mg/dL (ref 0.3–1.2)
Total Protein: 7.3 g/dL (ref 6.0–8.3)

## 2010-04-28 LAB — CBC
HCT: 35.9 % — ABNORMAL LOW (ref 36.0–46.0)
Hemoglobin: 12.8 g/dL (ref 12.0–15.0)
MCHC: 35.7 g/dL (ref 30.0–36.0)
MCV: 94.6 fL (ref 78.0–100.0)
Platelets: 235 K/uL (ref 150–400)
RBC: 3.8 MIL/uL — ABNORMAL LOW (ref 3.87–5.11)
RDW: 13 % (ref 11.5–15.5)
WBC: 5.2 K/uL (ref 4.0–10.5)

## 2010-04-28 LAB — URINE CULTURE

## 2010-04-28 LAB — URINE MICROSCOPIC-ADD ON

## 2010-04-28 LAB — PREGNANCY, URINE: Preg Test, Ur: NEGATIVE

## 2010-06-05 NOTE — H&P (Signed)
NAMELECIA, ESPERANZA NO.:  [id number]   MEDICAL RECORD NO.:  [id number]          PATIENT TYPE:  EMS   LOCATION:  ED                            FACILITY:  [facility]   PHYSICIAN:  Seymour Bars, D.O.      DATE OF BIRTH:  1960/02/26   DATE OF ADMISSION:  06/22/2004  DATE OF DISCHARGE:  06/22/2004                                HISTORY & PHYSICAL   ATTENDING PHYSICIAN:  Asencion Partridge, M.D.   CHIEF COMPLAINT:  Abdominal distention.   HISTORY OF PRESENT ILLNESS:  A 50 year old African American female with a  history of lung adenocarcinoma in 1997, status post VATS thoracotomy of the  left upper lobe is being admitted for abdominal distention and generalized  abdominal pain for one month. She also had continual nausea and vomiting  even on a clear liquid diet. Her abdominal pain is worse after trying to  eat. She is only able to eat a few bites and has instant nausea and  vomiting. Her abdominal pain is generalized. She reports a burning sensation  in her epigastric region.  She did not really have any sharp abdominal pain.  She is able to pass gas and have normal bowel movement. She does have  slightly loose stools starting today. We worked this up outpatient including  electrolytes which have been normal. Her liver function tests have also been  normal and lipase has been normal. She has not really gained any weight, but  has noted some increased abdominal girth with inability to wear her normal  pants. We got an acute abdominal series last week which showed dilated loops  of bowel with a questionable ileus.   REVIEW OF SYSTEMS:  CONSTITUTIONAL: She denies any fever, chills,  nightsweats, or change in weight. CARDIOVASCULAR: She denies any chest pain  or palpitations. PULMONARY: Denies any change in cough, but has had  increased shortness of breath. No orthopnea. GI: She has had generalized  abdominal pain and distention, nausea and vomiting, some loose stools,  increased  belching. Last bowel movement was this morning. No bleeding or  melena noted. GU: Nocturia including three times per night, status post  bladder sling times four with history of interstitial cystitis.   PAST MEDICAL HISTORY:  1.  Major recurrent depression.  2.  Gastroesophageal reflux disease.  3.  Hematuria.  4.  Stress incontinence.  5.  Hyperlipidemia.  6.  Hypertension.  7.  History of lung adenocarcinoma in 1997.  8.  Urethral stenosis.  9.  Allergic rhinitis.   MEDICATIONS:  1.  Effexor XR 150 mg daily.  2.  Hydrochlorothiazide 12.5 mg daily.  3.  Protonix 40 mg daily.  4.  Zocor 40 mg q.h.s.  5.  Zyrtec p.r.n.   ALLERGIES:  No known drug allergies.   PAST SURGICAL HISTORY:  1.  VATS thoracotomy with left upper lobe resection in 1997.  2.  Partial hysterectomy in 1994.  3.  Ovarian cyst in 1993.  4.  Cystoscopy several times.  5.  Urethral dilatation.  6.  Sling procedure per stress incontinence  several times.   FAMILY HISTORY:  Her father has hypertension and end-stage renal disease on  dialysis. Her mother has Alzheimer disease, hypertension, and osteoporosis.   SOCIAL HISTORY:  She has a history of smoking, quit recently. She is  married, but in a rocky relationship and she has recently lost her job at  the [employer]. She has three children and has a lot of stress at home.   OBJECTIVE:  VITAL SIGNS: Temperature 98.4, blood pressure 116/68, pulse 72,  respiratory rate 20, weight 164 pounds.  GENERAL: Awake, alert, and oriented times three African American female,  appears older than stated age, in mild distress, sitting in front of the  garbage can.  HEENT: Head is normocephalic and atraumatic. Pupils equal, round, and  reactive. Extraocular movements are intact. Oropharynx pink and moist with  poor dentition and mostly edentulous.  LUNGS: Clear to auscultation bilaterally, nonlabored respirations.  HEART: Regular rate and rhythm without murmurs.  ABDOMEN:  Slightly distended, not tympanic to percussion, no rebound  tenderness, diffusely tender palpation. Normal bowel sounds with some high  pitched sounds.  EXTREMITIES: Without edema. Pulses 2+ femoral and dorsalis pedis.  GU/PELVIC: Atrophic vaginitis with anterior weakness, mild cystocele,  fullness up to the pubic symphysis, but no discrete masses in the adnexa.  She does have some tenderness in the pelvic region on bimanual exam.   CBC reveals white count of 4.0, hemoglobin 12.6, hematocrit 38.5, platelet  count 300,000. CMET pending. CEA pending. Sed rate is 18. Hemoccult is  negative.   Labs from June 8th show an AST 16, ALT 38, lipase 28. Urinalysis show  specific gravity  of less than 1.55, pH 7.5, protein negative, nitrite  negative, LE negative, small blood, 0-3 rbc's, 1-5 epithelials. Wet prep  shows many clue cells.   Acute abdominal series from June 8th shows no free air and questionable  focal versus inflammatory ileus.   ASSESSMENT/PLAN:  A 50 year old [place] American female with history of lung  adenocarcinoma seen recently by Dr. Edwyna Shell for a question of recurrence on  CT admitted for nausea, vomiting, and abdominal distention times one month.  1.  Abdominal pain and distention. Possible ileus. Not on any pain      medications that should slow her bowels down or any other medication.      History of abdominopelvic surgery, so she does have the risk of      adhesions. Having gas and bowel movement so would not be a complete      obstruction if any. Unlikely pancreatitis with a normal lipase. Will      need abdominal and pelvic CT scan with contrast media, plus or minus EGD      colonoscopy to work this up. She will need a  GI consult. For now we      will keep her on sips and chips. If vomiting does worsen will place an      NG tube. Will put her on an PPI. 2.  Nausea and vomiting. Intravenous Phenergan and look for underlying      causes. IV fluid rehydration, monitor  strict I&Os and check daily      weights.  3.  Lung adenocarcinoma in 1997 without any recurrence since that time. Sees      Dr. Edwyna Shell as an outpatient and does need a follow-up CT scan in about      one month. We have checked a CEA for worry about recurrence reassuring  that her sed rate is only 18.  4.  Psychiatric. We are titrating up for Cymbalta.  5.  GI.  On  proton pump inhibitor, Protonix 40 mg p.o. daily.  6.  IV fluids. Will just give her maintenance IV fluids in the form of D-5      1/2 normal saline with potassium at 150 cc an hour. Will need normal      saline boluses if she continues to vomit. Will check her electrolytes as      her BMET is pending and place her on clears and ice chips for now.       KB/MEDQ  D:  07/02/2004  T:  07/02/2004  Job:  [id number]

## 2010-06-05 NOTE — Consult Note (Signed)
[name], [name]                  ACCOUNT NO.:  [id number]   MEDICAL RECORD NO.:  [id number]          PATIENT TYPE:  INP   LOCATION:  5705                         FACILITY:  [facility]   PHYSICIAN:  John C. Madilyn Fireman, M.D.    DATE OF BIRTH:  1960-04-28   DATE OF CONSULTATION:  DATE OF DISCHARGE:                                   CONSULTATION   REASON FOR CONSULTATION:  Abdominal pain, nausea, and vomiting.   HISTORY OF ILLNESS:  The patient is a 50 year old black female who presents  with a five-month history of generalized abdominal pain, distention,  postprandial nausea and vomiting.  This has gotten worse in the last month,  although she denies any weight loss.  She denies any hematemesis, fever, or  change in bowel habits.  Denies any black stools.  She had been taking a lot  of Aleve until about a week ago when her doctor told her to stop this.  She  is admitted for further evaluation.  She did have a KUB on June 25, 2004  which showed a single prominent small bowel loop, possibly representing  focal ileus or a point of obstruction.   PAST MEDICAL HISTORY:  1.  Depression.  2.  Gastroesophageal reflux.  3.  Hematuria.  4.  Hypercholesterolemia.  5.  Hypertension.  6.  Lung cancer.  7.  Allergic rhinitis.   SURGERIES:  1.  Lobectomy in 1997.  2.  Hysterectomy.  3.  Ovarian cyst removed.  4.  Cystoscopy.   MEDICATIONS:  Cymbalta; Zyrtec; Zocor; Protonix; hydrochlorothiazide.   SOCIAL HISTORY:  The patient currently does not smoke cigarettes.  She  denies alcohol use.  She works at a [employer].  She has three children.   FAMILY HISTORY:  Negative for GI malignancy or peptic ulcer disease.   PHYSICAL EXAMINATION:  GENERAL:  Well-developed, well-nourished black female  in no acute distress.  HEART:  Regular rate and rhythm, without murmur.  LUNGS:  Clear.  ABDOMEN:  Soft, slightly distended, with normoactive bowel sounds.  No  hepatosplenomegaly, mass, or guarding.  There is  tenderness in the  epigastrium and both lower quadrants.   IMPRESSION:  Abdominal pain, nausea, and vomiting, etiology unclear.  CBC  unrevealing, and other labs pending.   PLAN:  CT of the abdomen is already ordered, and will await this before  recommending further workup.  She may need upper endoscopy, especially given  the strong history of NSAID use.  Will also check hemoccults.       JCH/MEDQ  D:  07/02/2004  T:  07/02/2004  Job:  [id number]   cc:   Asencion Partridge, M.D.  Fax: [phone]

## 2010-06-05 NOTE — Op Note (Signed)
[facility]  Patient:    [name], [name]                         MRN: [id number] Proc. Date: 06/23/00 Adm. Date:  [id number] Attending:  Evlyn Clines CC:         Cheree Ditto, M.D.   Operative Report  PROCEDURE:  SPARC sling  PREOPERATIVE DIAGNOSIS:  Stress incontinence.  POSTOPERATIVE DIAGNOSIS:  Stress incontinence.  SURGEON:  Excell Seltzer. Annabell Howells, M.D.  ANESTHESIA:  General.  DRAIN:  Foley catheter.  COMPLICATIONS:  None.  INDICATIONS:  [name] is a 50 year old black female, who presents with stress incontinence requiring pads, and it was felt that suspension procedure was indicated.  She elected the [facility] sling.  FINDINGS AT PROCEDURE:  The patient was given p.o. Tequin.  She was taken to the operating room where a general anesthetic was induced.  Her mons was shaved.  She was placed in the lithotomy position.  Her lower abdomen, perineum, and genitalia were prepped with Betadine solution.  She was draped in the usual sterile fashion.  A Foley catheter was inserted.  The bladder was drained.  The anterior vaginal wall was infiltrated with about 10 cc of 0.25% Marcaine.  The retropubic space was infiltrated with about 20 cc on each side of .125% Marcaine just behind the pubis.  A midline vaginal incision was made over the mid urethral level approximately 2-3 cm in length.  The mucosa was elevated off the pubourethral fascia for approximately 2 cm laterally on each side.  A 1 cm incision was made 1 fingerbreadth above and to the right of the midline.  This was followed by a similar incision located 1 fingerbreadth above and to the left of midline.  Once these incisions were made, a hemostat was used to spread the fat to the fascia.  The first [facility] sling passer was then passed through the right incision.  The tip of the passer was moved downward until it contacted the pubis, then walked backwards to the back of the pubis where it was popped  through the fascia.  The passer was then walked along the back of the pubis until it could be palpated with a finger in the vaginal incision on the right side.  The urethra was held to the left with the finger while the sling passer was passed through the pubourethral fascia. Once the right needle had been passed, the left was passed in identical fashion.  At this point, the Foley was removed; cystoscopy was performed, and inspection with the 70 degree lens revealed no evidence of injury to the bladder or urethra.  The bladder was left full, and the scope was removed. Pressure on the bladder produced persistent leakage.  The [facility] sling material was then snapped onto the ends of the two sling passers and then drawn on each side into the abdominal incision.  Once the sling position had been adjusted appropriately, the plastic sheath was removed from each side.  The Foley catheter had been reinserted for this portion of the procedure, and I was able to pass a hemostat beneath the sling without difficulty.  Once I felt the sling was appropriately adjusted, the central tethering suture was removed. The Foley catheter was removed.  Pressure on the bladder produced persistent leakage as desired.  The anterior vaginal wall was then closed with a running lock 2-0 Vicryl suture.  The Foley catheter was reinserted; the bladder was  drained, and the catheter was placed to straight drainage.  The long abdominal ends of the sling material were then trimmed so they were below the skin.  The vaginal vault was packed with two inch Iodoform gauze.  The abdominal wounds were cleaned, prepped with Benzoin, and closed with half-inch Steri-Strips. At this point, the patient was taken down from lithotomy position.  A dressing was applied to the suprapubic wound.  Her anesthetic was reversed.  She was taken to the recovery room in stable condition.  There were no complications. DD:  06/23/00 TD:  06/23/00 Job:  [id number]

## 2010-06-05 NOTE — Op Note (Signed)
NAMEKELLY, RANIERI                  ACCOUNT NO.:  [id number]   MEDICAL RECORD NO.:  [id number]          PATIENT TYPE:  INP   LOCATION:  A427                          FACILITY:  [facility]   PHYSICIAN:  [name], M.D. DATE OF BIRTH:  05/08/1960   DATE OF PROCEDURE:  09/17/2005  DATE OF DISCHARGE:                                 OPERATIVE REPORT   PREOPERATIVE DIAGNOSES:  1. Left ovarian remnant syndrome.  2. Dyspareunia.   POSTOPERATIVE DIAGNOSES:  1. Left ovarian remnant syndrome.  2. Dyspareunia.   PROCEDURES:  1. Laparotomy with left salpingo-oophorectomy and lysis of adhesions.  2. Oversewing of ascending colon serosal tear.   SURGEON:  [name], MD   ASSISTANT:  Annabell Howells, RN   ANESTHESIA:  General.   SPECIMENS:  Left tube and ovary to lab.   ESTIMATED BLOOD LOSS:  100 mL.   FINDINGS:  Extensive adhesions along the liver edge and gallbladder even  though she has not had any known history of gallbladder disease.  A small  serosal tear of the cecum, not all the way through, resutured with  interrupted sutures.  Extensive adhesions from a large portion of left  ovarian remnant and a small left hydrosalpinx, both excised in their  entirety.   DETAILS OF PROCEDURE:  The patient was prepped for lower abdominal surgery  with a Foley catheter in place, vaginal prep was performed.  The prior lower  abdominal transverse incisions were extremely fibrotic and it was felt wise  to go midline vertical, which was performed carefully, cautiously, and using  Bovie cautery until we reached the level of the muscle layer, whereupon  Metzenbaum scissors were used thereafter and #15 blade, gradually working  down and entering the peritoneal cavity high on the abdomen, approximately 2  cm above the umbilicus and then carefully opening the lower abdomen.  There  were some omental adhesions to the anterior abdominal wall.  There were  minimal small bowel adhesions.  We gradually  carefully dissected the omental  adhesions off the anterior abdominal wall sufficiently to identify the  internal abdominal and pelvic structures.  The small bowel was thinly  adherent between the loops of bowel, and this was freed up enough to  gradually move the bowel out of the way.  The left sigmoid colon was  adherent to the sidewall but would peel free with ease.  The bowel could be  elevated and packed away using moistened laparotomy tapes, followed by  Balfour retractor.  Attention was directed to the left adnexa, which could  be palpated and found to have firm fibrous tissue lateral to the sigmoid  colon, felt to represent ovarian residual tissue.  There was also a  hydrosalpinx, and the remnants of the round ligament were extremely  thickened and enlarged, almost a centimeter in diameter.  When the pelvic  sidewall could be cleared up sufficiently to allow identification of ureter,  we took down the round ligament, doubly clamping, cutting and suture  ligating.  Then I opened the retroperitoneum along the left pelvic brim over  the  external iliac artery.  The ureter could be palpated and plucked with  the banjo string technique, allowing [place] to be sure that the ureter was out  of harm's way.  The infundibulopelvic ligament could be isolated, elevated,  clamped, cut and suture ligated.  The remnant of the tube and ovary were  gradually peeled off the sidewall and then resected off of the remnants of  the upper and lower cardinal ligaments on the left side.  This had resulted  in a lot of traction on the tube and ovary and certainly was a major  contributor to dyspareunia.  Eventually the specimen could be peeled off.  A  small additional remnant of hydrosalpinx required separate peeling out of  the pelvic floor.  Hemostasis was quite good.  A couple of horizontal  mattress sutures of 3-0 Dexon were performed to achieve adequate hemostasis  to discontinue surgical dissection.  The  pelvis was irrigated and hemostasis  confirmed.  The right side was inspected and the loops of small bowel which  had thin, filmy adhesions between each loop were resected free to allow for  improved bowel mobility.  Minimal blood loss was encountered in this part of  the procedure.  Upon completion of this, palpation of the upper abdomen  showed that there were extensive adhesions around the gallbladder.  While we  could not feel stones within the gallbladder, there were lots of omental  adhesions to the right lower and upper quadrant.  Should the patient need  laparoscopic cholecystectomy at any point, it is felt that entry at the  umbilicus might not be a problem but extensive adhesions will certainly be  encountered in the area around the gallbladder.   In inspecting the structures on the way out, we found some serosal tear in  the area of the cecum.  This was oversewn with three interrupted sutures of  3-0 silk.  Tissue approximation was good.   At this time closure of the abdominal cavity was performed with 2-0 chromic  closure of the peritoneum, 0 Vicryl closure of the fascia, subcutaneous  tissue was reapproximated with 2-0 plain, and staple closure of the skin  completing the procedure.  Sponge and needle counts were correct.  The  patient went to the recovery room in good condition.      [name], M.D.  Electronically Signed     JVF/MEDQ  D:  09/16/2005  T:  09/17/2005  Job:  [id number]

## 2010-06-05 NOTE — Op Note (Signed)
[facility]  Patient:    [name], [name] Visit Number: [id number] MRN: [id number]          Service Type: SUR Location: [id number] Attending Physician:  Evlyn Clines Proc. Date: 10/04/00 Admit Date:  10/04/2000   CC:         [facility]   Operative Report  PROCEDURE:  Pubovaginal sling with Dermatrix.  PREOPERATIVE DIAGNOSES:  Stress incontinence and cystocele.  POSTOPERATIVE DIAGNOSES:  Stress incontinence and cystocele.  SURGEON:  Excell Seltzer. Annabell Howells, M.D.  ANESTHESIA:  General.  SPECIMENS:  None.  DRAIN:  Suprapubic tube and vaginal pack.  COMPLICATIONS:  None.  INDICATIONS:  Ms. Dugue is a 50 year old black female, who originally presented with stress incontinence several months ago.  She underwent a SPARC sling approximately 6-8 weeks ago.  After the sling she had problems with persistent incontinence, and there was some early erosion of the attached suture.  She was taken back for revision because of that erosion.  The sling was noted to be on the distal urethra instead of the mid urethra, and it was divided and moved back to the mid urethral level.  This could not correct the problem. She had persistent incontinence and was noted on follow-up to have increased hypermobility with small cystocele.  After discussing the options with the patient, she elected to proceed with a formal pubovaginal sling with Dermatrix and possible anterior repair.  FINDINGS AT PROCEDURE:  The patient was given PO Tequin.  She was taken to the operating room where a general anesthetic was induced.  She was placed in the lithotomy position.  Her mons was shaved.  She was prepped with Betadine solution.  She was draped in the usual sterile fashion.  A 16 French Foley catheter was inserted, and the bladder was drained.  The anterior vaginal wall was infiltrated with 20 cc of 1% lidocaine with epinephrine.  A midline anterior vaginal wall  incision was made with a knife from just proximal to the bladder neck out to the mid urethral level.  I was unable to identify the ends of the [facility] sling and the postoperative scar from the prior procedure, and I did not feel it would be of value to dissect that out, although we had discussed that.  Originally at this point, it appeared that it would add potential comorbidity to the procedure.  The vaginal mucosa was elevated off the pubourethral fascia at the detachment to the pubis on each side, and the pubourethral fascia was pierced, and its attachment to the pubis first on the right with Struhle scissors.  Finger was passed into the retropubic space. This was then repeated on the left.  Once this had been performed, a 3 cm transverse incision was made in the midline over the pubis.  The fat was spread down to the fascia with a Kelly clamp.  The Dermatrix 2 x 8 cm strip was then prepared with soaking.  A #1 Prolene was placed at each end with a quadruple helical stitch.  The Raz needle was passed from the abdominal wound into the vaginal incision under finger guidance, first on the right.  There was some dense scarring in the suprapubic area from the prior procedure that required unusual force to penetrate.  In light of this, I elected to cystoscopy immediately after passing the needle.  The revealed no evidence of needle passage to the bladder or other evidence of bladder injury.  The right flank suspension  sutures were then pulled into the abdominal incision and held with a hemostat.  This procedure was then repeated on the left.  Once again, there was dense fibrosis just under the anterior rectus fascia which made passage of the Raz needle more problematic; however, with additional force, I was able to pass the needle into the vaginal space under finger guidance. Once again, cystoscopy was performed, and no evidence of bladder injury or needle traverse through the bladder was  identified.  The bladder was filled quite full for this inspection, and the 70 degree lens was used.  The left suspension suture was then drawn into the abdominal incision.  The sling was then tacked at the mid urethra and bladder neck with 2-0 Vicryl tacking sutures in the midline of the sling.  The sling sutures were then pulled up to draw the ends of the sling up into the retropubic space.  Once this had been completed, the anterior vaginal wall was closed with a running locked 2-0 Vicryl.  Cystoscopy was performed once again.  The sling was in good position. The bladder was filled.  The patient was placed in the Trendelenburg position, and a Microvasive Fader tip suprapubic tube was passed through the anterior abdominal wall through a separate stab wound into the bladder under cystoscopic guidance.  Once in position, the trocar was removed; the coil was formed and secured, and the catheter was placed to straight drainage.  The bladder was then drained.  The cystoscopy sheath was left in the urethra to provide a neutral position for tying of the sling sutures.  They were then tied under minimal tension over hemostats, first the right and then the left. Then they were tied across the midline to each other, once again ensuring minimal tension.  The long ends were trimmed, and the knot was then tucked back into the abdominal incision.  The cystoscopy sheath was removed.  The abdominal incision was then closed using a running intracuticular 4-0 Vicryl stitch.  The suprapubic tube was secured with a 2-0 silk suture.  The abdominal incision was further secured with tincture of Benzoin and Steri-Strips, and a two-inch Iodoform vaginal pack was placed.  The patient was taken down from lithotomy position; her anesthetic was reversed.  She was moved to the recovery room in stable condition.  There were no complications. Please note, PAS hose were used for the procedure. Attending Physician:   Evlyn Clines DD:  10/04/00 TD:  10/04/00 Job: [id number]

## 2010-06-05 NOTE — H&P (Signed)
[name], [name]                  ACCOUNT NO.:  [id number]   MEDICAL RECORD NO.:  [id number]          PATIENT TYPE:  AMB   LOCATION:  DAY                           FACILITY:  [facility]   PHYSICIAN:  [name], M.D. DATE OF BIRTH:  [birth year]   DATE OF ADMISSION:  DATE OF DISCHARGE:  LH                                HISTORY & PHYSICAL   ADMISSION DIAGNOSIS:  Pelvic pain of left ovarian remnant.   HISTORY OF PRESENT ILLNESS:  This 50 year old female status post  hysterectomy, also status post laparotomy and bilateral salpingo-  oophorectomy by Dr. Lisette Grinder in January2007 is admitted at this time for  abdominal pain and a painful cystic mass in the left lower quadrant which is  causing her discomfort in day to day activities and particularly notable on  intercourse with severe dyspareunia.  She has had extensive pelvic surgeries  in the past including 4 bladder surgeries including a urethral sling.  She  has had a hysterectomy and oophorectomy as a part of those surgeries. Dr.  Preston Fleeting surgery in January2007 resulted in him describing the pelvis as a  mess.  Ultrasound suggested a cystic tubular structure on the left adnexa,  tender, palpable at the vaginal apex. Transvaginal ultrasound shows a  multicystic mobile structure consistent with ovary and tube felt to  represent hydrosalpinx and probable residual ovarian tissue.  CA-125 has  been ordered which is 10, within normal limits.  [name] has been made  aware of the fact that surgery may be technically challenging, bowel prep  has been performed with GoLYTELY. The patient is therefore admitted for  laparotomy and lysis of adhesions with removal of cystic left adnexal mass  planned.  The patient is aware that if surgical difficulties are extreme  that it may be difficult to have complete resolution of her pain.  She  understands this but finds her current situation unacceptable.  She has been  seen in the emergency room recently  in August for the persistence of the  severe pain.  CT of the pelvis showed multiloculated cystic structure  consistent with hydrosalpinx and/or residual ovarian tissue with adhesions.   PAST MEDICAL HISTORY:  Positive for asthma, depression, hypertension.   SOCIAL HISTORY:  No drug abuse.  Nonsmoker, nondrinker.   FAMILY HISTORY:  Notable for hypertension.   SURGICAL HISTORY:  Multiple pelvic surgeries. Lung cancer.   PHYSICAL EXAMINATION:  GENERAL:  Shows a somber, intelligent, communicative,  African-American female who seems to understand her problem clearly.  HEENT:  Pupils equal, round, and reactive.  NECK:  Supple. Trachea midline.  CHEST:  Clear to auscultation.  ABDOMEN:  Tender in the lower abdomen.  PELVIC:  Particularly notable for tenderness just above the vaginal cuff.  VITALS:  Weight 153, blood pressure 150/86, pulse 70s.   PLAN:  Laparotomy and removal of adnexal mass September 16, 2005.      [name], M.D.  Electronically Signed     JVF/MEDQ  D:  09/15/2005  T:  09/15/2005  Job:  [id number]

## 2010-06-05 NOTE — Discharge Summary (Signed)
NAMEHATSUE, [name]                  ACCOUNT NO.:  [id number]   MEDICAL RECORD NO.:  [id number]          PATIENT TYPE:  INP   LOCATION:  5715                         FACILITY:  [facility]   PHYSICIAN:  Franchot Mimes, MD      DATE OF BIRTH:  04/11/1960   DATE OF ADMISSION:  07/02/2004  DATE OF DISCHARGE:  07/04/2004                                 DISCHARGE SUMMARY   DISCHARGE DIAGNOSES:  1.  Abdominal pain unknown etiology.  2.  Gastroesophageal reflux disease.  3.  History of hypercholesterolemia.  4.  Hypertension.  5.  History of major depression.  6.  Menopausal symptoms.  7.  History of lung adenocarcinoma.   DISCHARGE MEDICATIONS:  1.  HCTZ 12.5 mg daily.  2.  Cymbalta 40 mg daily.  3.  Protonix or other PPI to be used daily.  4.  Zocor 40 mg samples daily as available.  5.  MetroGel 0.75% gel per vagina q.h.s. x5 nights.   CONSULTS:  GI, Dr Dorena Cookey.   PROCEDURES:  CT of the abdomen and pelvis on July 02, 2004.   Results:  Normal CT scan of the abdomen.  Patient is status post  hysterectomy.  Otherwise, slightly prominent loops of small bowel with no  evidence of obstruction or perforation.   HISTORY OF PRESENT ILLNESS:  Please see the admission H&P for full details.  Briefly, patient is a 50 year old African-American female who was admitted  for continued nausea and vomiting while on clear liquid diet.  Abdominal  pain was getting worse.  Concern was for ileus.  On physical exam, patient  has slight distention with normal bowel sounds but some high pitched bowel  sounds.   LABORATORY DATA:  CBC was within normal limits, sed rate was 18, hemoccult  negative.  Urinalysis revealed very dilute urine, no nitrite, no LE.  Wet  prep revealed many clue cells.   HOSPITAL COURSE:  1.  Abdominal pain as stated above.  GI was consulted.  CT scan was      negative.  Abdominal pain improved greatly after two days.  This may      have been likely small ileus or small obstruction  that resolved with      medical management as patient was tolerating normal diet without      complication at time of discharge.   1.  History of depression.  Patient was continued on her Cymbalta.  She was      in good spirits at time of discharge.   1.  Hypertension.  Blood pressures were well controlled on the HCTZ, this      will be continued at discharge.   1.  Hypercholesterolemia.  She was also continued on Zocor.  She will need      followup labs as an outpatient.   1.  Vaginitis.  Positive clue cells on admission on wet prep.  The patient      was sent home with a prescription for MetroGel to be used for 5 nights.   DISCHARGE DIET:  Normal.   SUGGESTED  FOLLOWUP ITEMS:  1.  The patient had a slightly low TSH of 0.307 during hospitalization.      This should likely be followed up in the next 3 months as an outpatient.  2.  Glucose intolerance.  The patient's hemoglobin A1c was 5.6.  She did      have mildly elevated fasting glucoses greater than 100 during hospital      stay.  She should be followed for likely pre-diabetes as well.   FOLLOWUP:  The patient was instructed to call family practice center for a  followup appointment within 1 month.       TV/MEDQ  D:  07/04/2004  T:  07/05/2004  Job:  [id number]

## 2010-06-05 NOTE — H&P (Signed)
[name], [name]                  ACCOUNT NO.:  [id number]   MEDICAL RECORD NO.:  [id number]          PATIENT TYPE:  AMB   LOCATION:  DAY                           FACILITY:  [facility]   PHYSICIAN:  Langley Gauss, MD     DATE OF BIRTH:  01/15/1961   DATE OF ADMISSION:  01/28/2005  DATE OF DISCHARGE:  LH                                HISTORY & PHYSICAL   Planned procedure is laparotomy with bilateral oophorectomy. Expected  inpatient admission following this procedure with a 2-day hospital stay.   HISTORY OF PRESENT ILLNESS:  The patient is a 50 year old gravida 2, para 2,  two prior vaginal deliveries, who is seen  after a follow-up emergency room  visit after at which time she was seen by Dr. Margretta Ditty. The patient states  she has had what she described as stomach pain since March, 2005. She took  lots of Aleve for this with no relief of symptoms. She states that on  January 23, 2005 she had an acute and severe increase the left adnexal type  pain though it had been dull and aching since March 2005. This was the first  severe exacerbation. It was very sharp and incapacitating such that it  doubled her over in pain and prompted her emergency room visit. Currently  she has been seen by multiple practitioners in [place] prior to this and  most recently underwent a complete GI workup. She states she has had  colonoscopies done as well as CTs which have failed to reveal any  abnormalities more specifically no ulcers. The patient in association with  the left sided pain, onset January 6. Has also noted some abdominal  distension and bloating which has not yet resolved.   REVIEW OF SYSTEMS:  Pertinent for about a 20 pound weight loss in 1 year.  She does have some nausea with eating. She is a nonsmoker. She does  experience significant reflux symptoms. She does experience some occasional  urinary retention as a result of multiple bladder surgeries previously.   PAST MEDICAL HISTORY:  1.   In 1995 in [place] the patient had a cyst on the left ovary which was      initially planned to be removed by laparoscopy, however supposedly due      to the size of that being that about an egg, the patient underwent open      laparotomy by Dr. Mora Appl at which time left ovarian cystectomy was      performed.  She does know that was a benign process but does not know      any pathology. She had no complications following that surgery. The      patient subsequently, about 3 years after that, underwent total      abdominal hysterectomy. She felt at that time that the ovaries had been      removed. We do not have the operative report. The patient did have onset      of hot flashes after the surgical procedure but has never been on any      hormone  replacement therapy. The patient has also had a bladder sling      surgery x4, most recently for porcine products were utilized. The      patient does have fairly good bladder functioning but occasionally does      experience urinary retention.  2.  The patient states she had lung cancer in 1997 and had one fourth of her      lung long removed which she states was a complete cure, no chemotherapy      or postoperative radiation therapy was required. She states she had a      chest x-ray done about 1 year ago which did not show any recurrence. The      patient is a nonsmoker.  3.  The patient also has history of depression and anxiety disorder for      which she takes Cymbalta.  4.  The patient has hypertension for which she takes hydrochlorothiazide 25      mg daily.  5.  She takes Protonix for reflux symptoms.   ALLERGIES:  She has no known drug allergies   The left adnexal type pain continues to this date, although was not as  severe as when she initially presented to the emergency room, but is  incapacitating enough that she is out of work all this week and will remain  out of work until definitive surgical therapy is performed.   PHYSICAL  EXAMINATION:  GENERAL:  She is a pleasant black female. She is a  nonsmoker.  VITAL SIGNS:  Blood pressure is 116/76, pulse of 94, weight is 161 pounds.  HEENT:  Negative. No adenopathy. Neck is supple. Thyroid is not palpable.  LUNGS:  Clear.  CARDIOVASCULAR:  Regular rhythm.  ABDOMEN:  The abdomen is soft and nontender. Surgical scars consist of a  Pfannenstiel incision, very small in nature, from prior left ovarian  cystectomy and abdominal hysterectomy. She also has a lower suprapubic  incision from the multiple bladder sling procedures which have been  performed.  EXTREMITIES:  Are within normal limits.  PELVIC EXAM:  Reveals normal external genitalia, no lesions or ulcerations  identified. No vaginal bleeding. There was good estrogen stimulation of the  external vulva. Cuff is well supported. There is no significant cystocele or  rectocele identified. Bimanual examination reveals good anterior and  posterior support no enterocele is identified, cuff is well-supported.  Bimanual examination reveals the complete absence of the uterus, the ovaries  are nonpalpable but there is some significant tenderness and pain elicited  in the patient by manipulation of the adnexal regions.   LABORATORY STUDIES:  Abdominal and pelvic CT with contrast performed in the  emergency room on January 18, 2005 revealed no acute findings in the abdomen.  The patient is status post hysterectomy, complex left adnexal processes  identified with a small amount of free fluid within the pelvis. The right  ovary was unremarkable, appendix was normal.   DIAGNOSTIC IMPRESSION OF RADIOLOGIST:  Complex left adnexal mass with a  small amount of free fluid. Differential considerations would include  hemorrhagic cyst, pelvic inflammatory disease or less likely tumor.  Recommended careful follow-up as could not exclude a malignant process.  Other laboratory studies performed revealed a white count 4.8 thousand,   hemoglobin 12.8, hematocrit 38.0. Urinalysis completely unremarkable.  Electrolytes are slightly abnormal with potassium of 3.4. Liver function  tests within normal limits.   ASSESSMENT/PLAN:  50 year old patient with marked left adnexal pain due to  this complex  adnexal mass with evidence of probable recent rupture leakage  with free fluid. The thick collapsed wall of the cyst on the left indicates  this is probably a lesion of longstanding duration, possibly recurrence of  the previously excised left ovarian cyst. Management plans would include a  very close follow-up of this to see whether resolves, however in  consideration of the longstanding duration of the patient's pain and the  current incapacitating nature of the pain, the patient needs to proceed with  definitive surgical management, particularly in light of the fact that she  is currently out of work and is unable to work due to this pain. Thus plan  is to proceed with open laparotomy with removal of ovaries bilaterally and  initiation of hormone replacement therapy immediately postoperatively. The  patient does understand that removal of the ovaries does result in surgical  menopause. Consideration was also given to laparoscopic removal of  the ovaries but in light of multiple prior laparotomies the ovaries  themselves very likely are retro-peritonealized and will be very difficult  to dissect free from the ureter for complete removal, thus we will proceed  with open laparotomy with removal of tubes and ovaries bilaterally.      Langley Gauss, MD  Electronically Signed     DC/MEDQ  D:  01/26/2005  T:  01/26/2005  Job:  [id number]   cc:   Operating Room, Day Surgical Unit

## 2010-06-05 NOTE — Op Note (Signed)
[name], SCHWANKE                  ACCOUNT NO.:  [id number]   MEDICAL RECORD NO.:  [id number]          PATIENT TYPE:  INP   LOCATION:  A418                          FACILITY:  [facility]   PHYSICIAN:  Langley Gauss, MD     DATE OF BIRTH:  1960-04-30   DATE OF PROCEDURE:  01/28/2005  DATE OF DISCHARGE:                                 OPERATIVE REPORT   PREOPERATIVE DIAGNOSES:  Complex left adnexal mass with previous  hysterectomy.   PROCEDURE:  Laparotomy with bilateral salpingo-oophorectomy and lysis of  adhesions.   FINDINGS AT SURGERY:  Include evidence of recent rupture hemorrhagic ovarian  cyst on left with some adhesive disease. The right ovary was noted to be  markedly atrophic   SURGEON:  Dr. Langley Gauss.   ESTIMATED BLOOD LOSS:  200 cc.   COMPLICATIONS:  None.   SPECIMENS:  For permanent section only.   DRAINS:  Foley as well as JP in subcutaneous space.   SUMMARY:  The patient ______________ in the operating room. Vital signs  stable. Underwent uncomplicated induction of general anesthesia, prepped and  draped in usual sterile manner. A knife is used to excise previous  Pfannenstiel incision, dissected down to the fascial plane utilizing knife,  cauterizing bleeders along the way. Fascia was then incised in a transverse  curvilinear manner while utilizing the Mayo scissors. Fascial edges were  grasped using straight Kocher clamps. Fascia dissected off the underlying  rectus muscle in the midline both superiorly and inferiorly utilizing the  Mayo scissors. This allows the rectus muscles to be bluntly separated.  Peritoneal cavity is atraumatically bluntly entered at superior most portion  of the incision. Perineal incision extended superiorly and inferiorly.  Balfour self-retaining retractor is then placed as well as moist packs to  mobilize bowel out of the operative field. The left ovary is noted be deep  along the lateral pelvic sidewall. The right ovary is noted  be without any  adhesive disease and markedly atrophic in appearance. Dissection is  performed of the right ovary from the pelvic sidewall to free it up and  identify the infundibular pelvic ligament which is then doubly clamped with  Kelly clamps followed by doubly secured with free tie of 2-0 Vicryl followed  by suture ligature of 0 Vicryl in a Heaney fashion. This allows complete  removal of the atrophic appearing right ovary. Ureters are noted to be in  normal anatomic position along the lateral pelvic sidewall. The left ovary  is noted be less accessible. There is a clot just adjacent to it consistent  with recent spontaneous rupture of hemorrhagic ovarian cyst. The ovary is  identified, grasped with a Babcock clamp and elevated. Fine and meticulous  dissection is performed to free up the surrounding structures from the ovary  itself. This was all done in the avascular plane, skeletonizing the  infundibular pelvic ligament on the left which is then doubly clamped with  Kelly clamps followed by ligature with 0 Vicryl followed by suture ligature  of 0 Vicryl in a Heaney fashion. This allows removal of the  left ovary. Clot  is also removed and sent with the same specimen. Each of the left and right  fallopian tubes is identified, particularly the fimbriated ends. It is  clamped proximal to this with Kelly clamps followed by suture ligature of 0  Vicryl. This allows removal of the fallopian tubes. Copious irrigation is  performed of the pelvic cavity until clear. Balfour retaining retractor as  well as moist packs are removed. Sponge, instrument counts are correct x2 at  this point. Peritoneal edges are grasped using Kelly clamps. The peritoneum  and overlying rectus muscle are closed with a continuous running 0 chromic  suture. The fascia is then closed with a double-stranded looped #1 PDS  suture to result in a secure closure. JP is placed in subcutaneous space  with a separate exit  wound to the left apex of the incision. This is sutured  into place separately. Three horizontal mattress sutures of #1 PDS are  placed as retention type sutures. The patient tolerated procedure very well.  Skin is closed utilizing skin staples. The patient is then reversed of  anesthesia, taken to recovery room in stable condition. She will be treated  with 0.1 mg per day Climara patch to avoid onset of surgical menopause. She  continues to drain clear yellow urine following the procedure.      Langley Gauss, MD  Electronically Signed     DC/MEDQ  D:  01/29/2005  T:  01/29/2005  Job:  [id number]

## 2010-06-05 NOTE — Consult Note (Signed)
NAMEEADIE, REPETTO NO.:  [id number]   MEDICAL RECORD NO.:  [id number]          PATIENT TYPE:  EMS   LOCATION:  ED                            FACILITY:  [facility]   PHYSICIAN:  Lazaro Arms, M.D.   DATE OF BIRTH:  08-01-1960   DATE OF CONSULTATION:  09/25/2005  DATE OF DISCHARGE:  09/25/2005                                   CONSULTATION   HISTORY:  [name] is a 50 year old African-American female who underwent a  laparotomy with a vertical incision for an ovarian remnant syndrome last  week on September 16, 2005.  She presented to the ER this morning complaining  of a large gush of bloody fluid; and, I was called to assess the patient.   The patient's hemoglobin is 12.8.   PHYSICAL EXAMINATION:  ABDOMEN:  On examination she has at just the most  inferior aspect of the incision a small opening and she is draining very  thin, watery blood-tinged serosanguineous fluid.   The patient has a seroma.  She does not appear to be infected.  There is  very little fluid left with expression of the incision, but it is likely she  will continue to drain over the next couple days until it seals up.  To that  end she is placed on Cipro 500 mg twice a day to prevent any infection.  She  was given some more Tylox and she will be seen back in the office on  Wednesday for evaluation of the seroma.      Lazaro Arms, M.D.  Electronically Signed     LHE/MEDQ  D:  09/25/2005  T:  09/25/2005  Job:  [id number]

## 2010-06-05 NOTE — Discharge Summary (Signed)
[name], [name]                  ACCOUNT NO.:  [id number]   MEDICAL RECORD NO.:  [id number]          PATIENT TYPE:  INP   LOCATION:  A427                          FACILITY:  [facility]   PHYSICIAN:  [name], M.D. DATE OF BIRTH:  1961/01/10   DATE OF ADMISSION:  09/16/2005  DATE OF DISCHARGE:  09/01/2007LH                                 DISCHARGE SUMMARY   \   ADMISSION DIAGNOSES:  1. Pelvic pain.  2. Left ovarian remnant syndrome.   DISCHARGE DIAGNOSES:  1. Pelvic pain.  2. Left ovarian remnant syndrome.  3. Left hydrosalpinx.   PROCEDURE:  Laparotomy, left salpingo-oophorectomy, lysis of adhesions.   DISCHARGE MEDICATIONS:  1. Percocet 7.5 20 tablets, 1-2 every four hours p.r.n. pain.  2. Cymbalta 60 mg p.o. daily.  3. Estradiol 2 mg p.o. daily.  4. HCTZ 12.5 mg p.o. daily.  5. Protonix 40 mg p.o. daily.  6. Albuterol sulfate inhaler p.r.n. asthma.   HOSPITAL COURSE:  This 50 year old female was admitted for laparotomy for  suspected left ovarian remnant that was symptomatic and significantly  uncomfortable.  See admitting history.  Hospital course was notable for an  uncomplicated surgery.  She had midline vertical lower abdominal incision,  had removal of a generous portion of left ovary and left hydrosalpinx.  Postoperatively the patient did well with hemoglobin of 12, hematocrit of  34.6 compared to admitting hemoglobin 13.6 and hematocrit 39.1.  Electrolytes were within normal limits with potassium 4.1 postoperatively.  The patient was stable for discharge on postoperative day #2 for follow-up  four days for staple removal and then four weeks postoperative routine  visit.      [name], M.D.  Electronically Signed    JVF/MEDQ  D:  09/18/2005  T:  09/19/2005  Job:  [id number]

## 2010-06-05 NOTE — Op Note (Signed)
[facility]  Patient:    [name], [name]                         MRN: [id number] Proc. Date: 08/09/00 Adm. Date:  [id number] Disc. Date: [id number] Attending:  Evlyn Clines                           Operative Report  PROCEDURE:  Revision of SPARC sling.  PREOPERATIVE DIAGNOSES:  Sling erosion and persistent stress incontinence.  POSTOPERATIVE DIAGNOSES:  Persistent stress incontinence.  SURGEON:  Excell Seltzer. Annabell Howells, M.D.  ANESTHESIA:  General.  DRAINS:  Foley.  COMPLICATIONS:  None.  INDICATIONS FOR PROCEDURE:  Ms. Breon is a 50 year old black female who underwent a SPARC sling approximately six to seven weeks ago. She presented for her second follow-up visit and was complaining of discomfort in the urethral area. She was also complaining of persistent incontinence. On examination, there was some induration overlying the sling incision and the blue thread was noted to be coming through the incision indicating some degree of erosion with the tethering string from the sling being visible. I felt that a revision of the erosion was indicated with an attempt to further correct her incontinence if possible.  FINDINGS AND DESCRIPTION OF PROCEDURE:  The patient was taken to the operating room where she was given p.o. Tequin. A general anesthetic was induced. She was placed in the lithotomy position and her perineum and genitalia were prepped with Betadine solution and she was draped in the usual sterile fashion. A weighted vaginal retractor was inserted. A Foley catheter was placed and the bladder was drained. Thorough inspection of the anterior vaginal wall revealed that the previously eroded area had actually healed but with her persistent incontinence, I felt it was best that I go ahead and revise the sling to try to improve her outcome. The incision over the sling was identified and was opened. Upon opening the incision, the sling was actually noted  to be fairly well healed within the tissue; however, it appeared to be more distal than it had been originally placed now out in the area of the distal third of the urethra in an area where it would be unlikely to provide sufficient support for prevention of incontinence.  In light of that, I dissected the sling very carefully away from the urethra and divided it in the midline. A clamp was placed on each loose end so it wouldnt be loose. I then extended dissection back more proximally toward the bladder neck and embrocated the pubourethral fascia over the urethra in the midline to provide some bulk. The ends of the sling were then tacked down in the area of the mid urethra laterally to provide additional support the mid urethra. An additional layer was then closed over the area where the sling had been tacked. Following this, the vaginal wall edges were trimmed appropriately and then closed with interrupted 2-0 Vicryl sutures. Once this had been completed and hemostasis was achieved, the Foley catheter was removed and cystoscopy was performed. Examination revealed no evidence of urethral or bladder injury. The mid urethral area appeared to be appropriately elevated. The bladder was then filled moderately. Pressure did not produce significant leakage and no bladder lesions were noted. At this point, the cystoscope was removed, the Foley catheter was reinserted, the balloon was filled with 10 cc of sterile fluid. The bladder was then emptied and  the catheter was plugged. The patient was taken down from lithotomy position, her anesthetic was reversed. She was removed to the recovery room in stable condition. Her cath will be removed in the recovery room and she will be sent home when voiding on Vicodin and Keflex to follow with me in two weeks. DD:  08/09/00 TD:  08/09/00 Job: [id number]

## 2010-06-05 NOTE — Op Note (Signed)
[facility]  Patient:    [name], [name] Visit Number: [id number] MRN: [id number]          Service Type: Attending:  Barron Alvine, M.D. Dictated by:   Barron Alvine, M.D. Proc. Date: 03/27/01   CC:         Excell Seltzer. Annabell Howells, M.D.   Operative Report  PREOPERATIVE DIAGNOSES: 1. Recurrent stress urinary incontinence. 2. Recurrent cystocele. 3. Voiding dysfunction.  POSTOPERATIVE DIAGNOSES: 1. Recurrent stress urinary incontinence. 2. Recurrent cystocele. 3. Voiding dysfunction.  PROCEDURE: 1. Anterior repair. 2. Pubovaginal sling with fascia lata. 3. Flexible cystoscopy and suprapubic tube placement.  SURGEON:  Barron Alvine, M.D.  ASSISTANT:  Sigmund I. Patsi Sears, M.D.  ANESTHESIA:  General.  INDICATIONS:  Ms. Gullickson is a 50 year old female. She has had a complex urologic history over the last year or so. The patient initially presented with what appeared to be at least moderate stress urinary incontinence. She also had some evidence of bladder overactivity. She has had some complaints of increased frequency with the possibility of interstitial cystitis was also raised. She initially had treatment with antispasmodic medication. She underwent an eventual SPARC procedure for her incontinence with improvement. Unfortunately, she apparently developed some erosion of the sling and this was revised. She had recurrent incontinence and subsequently treated with a more formal pubovaginal sling procedure with DermMatrix. She reported recurrent leakage. She requested a second opinion and we evaluated her. She has ongoing mild stress incontinence which is improved. She has complaints of heaviness and protrusion from the vagina. She has some discomfort with intercourse and has continued to have some irritable bladder symptoms as well. She has been placed on anticholinergics without much improvement. On exam she had evidence of recurrent cystocele with evidence of  recurrent hypermobility. The patient was told that there was a variety of options including attempts at conservative measures along with antispasmodic medication and biofeedback, etc. The patient also underwent discussion about the possibility of recurrent surgical correction of the cystocele with replacement of the pubovaginal sling. She was told that the goal of that procedure would be to reduce this cystocele and a vaginal pressure protrusion she was experiencing as well as the stress incontinence but that this procedure may or may not at all improve her frequency, urgency, and some of her other complaints. She understood there was additional risk which would be potentially more complex because of her previous surgery including the possibility of infection, bleeding, or urinary tract injury. We talked about the possibility of prolonged retention or even worsening of her urgency, frequency, nocturia, etc. Full informed consent was obtained and the patient requested repeat surgical intervention.  TECHNIQUE AND FINDINGS:  The patient was brought to the operating room where she had successful induction of general anesthesia. She was placed in lithotomy position and prepped and draped in the usual manner. A Foley catheter was placed. A weighted vaginal speculum was used. Recurrent grade 2 cystocele was appreciated. The anterior vaginal wall was somewhat thickened and scarred. The mucosa was infiltrated with some lidocaine and an incision was made from mid urethra back to the scarred area of the previous cervical cuff. The tissue planes were a little bit difficult to establish because of the previous surgery. We were able to establish reasonable planes between the bladder and underlying vaginal mucosa. In this manner, the entire bladder was freed. It was very difficult to enter the patients retropubic spaces due to the previous scarring. A combination of blunt and sharp dissection  technique  was needed and this portion of the procedure was much more difficult than usual. We were able to eventually get fingers behind the pubic symphysis bilaterally and free up any adhesions to allow for good access of the sling into the retropubic space. The cystocele itself was reduced with several interrupted 2-0 Vicryl mattress sutures. Again, there was quite a bit of scarring but the cystocele did reduce reasonably nicely. Perivesical fascia was noted to be attenuated.  Attention was then turned to the suprapubic area where a small incision was made over the pubic symphysis. This was carried down to the level of the fascia. Previous Prolene sutures were identified and cut free. I could not appreciate the obvious sling during the bladder dissection but, of course, this tissue would normally get incorporated and identification of the actual sling would have been difficult. There was some additional thickening near the bladder neck but again, it was difficult to identify an obvious sling per se. We made a sling with fascia lata. Tutoplast piece was utilized. We ended up using a 6 x 8-cm piece, which was folded over for double thickness. Both ends were secured with #1 nylon suture. With direct fingertip control in the retropubic space, I was able to pass a clamp through the right side of the suprapubic incision and out the vaginal incision. The nylon sutures were grabbed and brought out the suprapubic incision. The same thing was done on the contralateral side and the sling was then positioned appropriately. It was pexed in open position with some 3-0 Vicryl suture. We used copious antibiotic irrigation on top as well as below. Some small amount of redundant vaginal mucosa was trimmed appropriately and the vaginal incision was closed with a running 2-0 Vicryl suture. Cystoscopy showed the sling to be in good position. There was no evidence of bladder injury and blue dye could be seen  from both  orifices. Vaginal packing was applied. The Foley catheter was removed and the suprapubic was placed with direct visual guidance by the cystoscope and left to gravity drainage. The sling itself was tied over an approximately a finger to two fingers in tightness. Subcutaneous tissues were closed and the skin was closed with clips. The patient appeared to tolerate the procedure well. She was brought to recovery room in stable condition. Dictated by:   Barron Alvine, M.D. Attending:  Barron Alvine, M.D. DD:  03/27/01 TD:  03/27/01 Job: [id number]

## 2010-06-05 NOTE — Discharge Summary (Signed)
[name], [name] NO.:  [id number]   MEDICAL RECORD NO.:  [id number]          PATIENT TYPE:  INP   LOCATION:  A418                          FACILITY:  [facility]   PHYSICIAN:  Langley Gauss, MD     DATE OF BIRTH:  1960/03/28   DATE OF ADMISSION:  01/28/2005  DATE OF DISCHARGE:  01/13/2007LH                                 DISCHARGE SUMMARY   PROCEDURES:  January 28, 2005, laparotomy with bilateral salpingo-  oophorectomy.   DISCHARGE MEDICATIONS:  1.  Tylox #30 with no refills.  2.  Climara patch 0.1 mg per day in place.   PERTINENT LABORATORY DATA:  Admission hemoglobin/hematocrit 12.5/35.7, white  count 4.1.  Postoperative day #1, 10.2/29.6 with white count of 7.2.  O  positive blood type.  Potassium normal at 3.5.  She was given a copy of  standardized discharge instructions.  On the day of discharge, the JP drain  as well as her tension sutures have been removed.   HOSPITAL COURSE:  On January 28, 2005, she was taken to the operating room  where through a Pfannenstiel incision a laparotomy with BSO was performed  without complications.  Postoperatively, the patient received IV Buprenex  and Phenergan, as well as IV fluids.  The evening of surgery, the patient  had borderline urine output was given sequentially over the next 12 hours,  two 500 cc boluses of IV fluid until her urine became copious in quantity  and clear yellow.  The Foley catheter was discontinued on postoperative day  #1.  The patient was having gas pains on postop day #1.  She was able to  advance her diet, and tolerate p.o. Tylox for pain relief.  With the Climara  patch in place.  She has minimal complaints of hot flashes.  The incision is  well approximated.  No erythema.  No significant induration.  The patient  did well on postoperative day #2, tolerating regular general diet.  Vital  signs remained stable.  She is fully ambulatory.  Abdomen is soft and  nondistended.  Discharged home  on January 30, 2005.      Langley Gauss, MD  Electronically Signed     DC/MEDQ  D:  01/31/2005  T:  02/01/2005  Job:  [id number]

## 2010-06-05 NOTE — Op Note (Signed)
[name], [name]                  ACCOUNT NO.:  [id number]   MEDICAL RECORD NO.:  [id number]          PATIENT TYPE:  AMB   LOCATION:  ENDO                         FACILITY:  [facility]   PHYSICIAN:  John C. Madilyn Fireman, M.D.    DATE OF BIRTH:  Jun 06, 1960   DATE OF PROCEDURE:  10/18/2004  DATE OF DISCHARGE:  10/15/2004                                 OPERATIVE REPORT   PROCEDURE PERFORMED:  Capsule endoscopy.   ENDOSCOPIST:  Barrie Folk, M.D.   INDICATIONS FOR PROCEDURE:  Persistent nausea, bloating with delayed gastric  emptying as well as dilated small bowel on small bowel series.   FINDINGS:  This was basically a normal study.  Full report has been typed  and is available in my office chart.  The capsule reached the cecum at three  hours and 45 minutes and 45 seconds.   IMPRESSION:  Normal study.   PLAN:  Review medications and consider prokinetic agent and discontinuation  of any anticholinergic medication that she might be on.           ______________________________  Everardo All. Madilyn Fireman, M.D.     [facility]/MEDQ  D:  10/18/2004  T:  10/19/2004  Job:  [id number]

## 2010-06-18 ENCOUNTER — Ambulatory Visit: Payer: Self-pay | Admitting: Family Medicine

## 2010-08-24 ENCOUNTER — Ambulatory Visit (INDEPENDENT_AMBULATORY_CARE_PROVIDER_SITE_OTHER): Payer: Self-pay | Admitting: Family Medicine

## 2010-08-24 ENCOUNTER — Encounter: Payer: Self-pay | Admitting: Family Medicine

## 2010-08-24 DIAGNOSIS — M159 Polyosteoarthritis, unspecified: Secondary | ICD-10-CM

## 2010-08-24 NOTE — Assessment & Plan Note (Signed)
Longstanding arthralgias- workup with RA, ESR in Sept 2010 does not reveal concern for inflammatory arthropathy.  Recommended regular tylenol arthritis, exercises to strengthen quads, and stress ball hand exercises to help with grip strength. Advised [name] to make follow-up appt with PCP as it has been a long time since she has had fasting labwork and addressing preventive care concerns.  She would benefit from regular follow-up/monitoring of [name] arthalgias.

## 2010-08-24 NOTE — Progress Notes (Signed)
  Subjective:    Patient ID: [name], female    DOB: 11-22-1960, 50 y.o.   MRN: [id number]  HPISame day appt: Here to discuss continued arthalgias.  Notes continued pain of sevral years of bilateral hands and knees and feet. Points to DIP, PIP and MCP joints in both hands.  Fels they are swollen, had decreasing strength, drops objects.  Does not take tramadol.  Not taking any pain medcations regularly.  Does not take ibuprofen due to belief it causes her constipation.  States she is in the process of applying for disability for her depression and pain.  States she has depression caused by her lung cancer and must be on lifelong amytrityline and sertraline.  I have reviewed patient's  PMH, FH, and Social history and Medications as related to this visit.  Review of Systems No fevee chills, n/v/d, weigh loss.    Objective:   Physical Exam  GEN: Alert & Oriented, No acute distress, anxious appearing CV:  Regular Rate & Rhythm, no murmur Respiratory:  Normal work of breathing, CTAB Ext: hands without synovial swelling, erythema.  Tender to palpation over every DIP, PIP, MCP.   Knees:  Good range of motionwithout pain over patella.  No effusion.  Weak quadriceps muscles and calves bilaterally.  Ankles withot swelling, erythema, edema.      Assessment & Plan:

## 2010-08-24 NOTE — Patient Instructions (Signed)
Use Tylenol Arthritis for pain May add on ibuprofen as needed Make follow-up appt to meet your new Doctor- Dr. Ashley Royalty Come fasting for this appointment- you are due for labwork

## 2010-10-09 LAB — URINALYSIS, ROUTINE W REFLEX MICROSCOPIC
Glucose, UA: NEGATIVE
Ketones, ur: NEGATIVE
Protein, ur: NEGATIVE

## 2010-10-13 ENCOUNTER — Other Ambulatory Visit (HOSPITAL_COMMUNITY): Payer: Self-pay | Admitting: Obstetrics and Gynecology

## 2010-10-13 ENCOUNTER — Other Ambulatory Visit: Payer: Self-pay | Admitting: Family Medicine

## 2010-10-13 DIAGNOSIS — Z139 Encounter for screening, unspecified: Secondary | ICD-10-CM

## 2010-10-13 NOTE — Progress Notes (Unsigned)
Routed to PCP 

## 2010-10-14 ENCOUNTER — Encounter: Payer: Self-pay | Admitting: Family Medicine

## 2010-10-14 ENCOUNTER — Ambulatory Visit (INDEPENDENT_AMBULATORY_CARE_PROVIDER_SITE_OTHER): Payer: Self-pay | Admitting: Family Medicine

## 2010-10-14 DIAGNOSIS — G4700 Insomnia, unspecified: Secondary | ICD-10-CM

## 2010-10-14 DIAGNOSIS — M159 Polyosteoarthritis, unspecified: Secondary | ICD-10-CM

## 2010-10-14 DIAGNOSIS — I10 Essential (primary) hypertension: Secondary | ICD-10-CM

## 2010-10-14 DIAGNOSIS — F339 Major depressive disorder, recurrent, unspecified: Secondary | ICD-10-CM

## 2010-10-14 DIAGNOSIS — E7800 Pure hypercholesterolemia, unspecified: Secondary | ICD-10-CM

## 2010-10-14 LAB — COMPREHENSIVE METABOLIC PANEL
Albumin: 4.4 g/dL (ref 3.5–5.2)
CO2: 26 mEq/L (ref 19–32)
Calcium: 9.8 mg/dL (ref 8.4–10.5)
Chloride: 101 mEq/L (ref 96–112)
Glucose, Bld: 94 mg/dL (ref 70–99)
Potassium: 3.5 mEq/L (ref 3.5–5.3)
Sodium: 140 mEq/L (ref 135–145)
Total Protein: 7.9 g/dL (ref 6.0–8.3)

## 2010-10-14 MED ORDER — TRAZODONE HCL 50 MG PO TABS: 50.0000 mg | ORAL_TABLET | Freq: Every day | ORAL | Status: DC

## 2010-10-14 MED ORDER — MELOXICAM 15 MG PO TABS: 15.0000 mg | ORAL_TABLET | Freq: Every day | ORAL | Status: DC

## 2010-10-14 NOTE — Assessment & Plan Note (Addendum)
She has history of longstanding arthralgias.  Workup for RA in Sept 2010 does not reveal concern for autoimmune arthropathy.  I do not find any swelling or redness of any of her joints on exam today.  Her gait is normal.  Only findings on exam are her reported pain with palpation.  I will have her continue meloxicam and tylenol arthritis at this time.  I will get films of her R hand today (since it is reported to be the worst) and also get bilateral standing knee films for evaluation of knee joint space.  She brought in disability paperwork that I will review and return to her disability attorney.

## 2010-10-14 NOTE — Assessment & Plan Note (Signed)
BP looks to be fairly well controlled today.  -Continue HCTZ -Check CMET today

## 2010-10-14 NOTE — Progress Notes (Signed)
Subjective:    Patient ID: [name], female    DOB: Dec 04, 1960, 50 y.o.   MRN: [id number]  HPI 50 year old African American woman presents to the clinic for follow up regarding her HTN, arthralgias, and depression.  She is requesting medication refills today and would like to change/increase her antidepressant medication.   1. HTN:  BP has been well controlled on current regimen.  Does not check BP at home typically. No changes in diet.  Denies chest pain, headache, numbness, weakness, sob.   2. Joint Pain:  She has a long standing history of history of arthralgias.  She reports bilateral hand (R>L), knee, and ankle pain.  She states that her hand pain is located in the finger joints.  Her generalized joint pain is currently 8/10 in severity and described as achy.  It is persistent and has been steadily increasing in severity for the past few years.  She does have swelling at times and reports her knees and ankles are swollen today.  She is currently taking meloxicam which helps some.  She states that the pain has been interfering with some of her activities of daily living (i.e. Difficulty wiping and standing after using the restroom), although she is able to sweep and do minor cleaning activities.  She is able to lift a basket of laundry without a problem.  Sitting does relieve her pain but she reports she does have to shift very often.  3. Depression:  [name] also states that her depression has been getting worse since June 2012.  Being unemployed and in constant pain, has led to her increasing depression.  She states that she feels sad, overwhelmed, and stress.   She does not feel like her amitriptyline is helping at all. She also reports difficulty sleeping (both falling and staying asleep).   4.  HLD: Currently diet controlled.  No changes in diet or exercise recently.     Review of Systems  Musculoskeletal: Positive for joint swelling and arthralgias.       Denies neck pain or  numbness/tingling/pain that radiates into arms.  Psychiatric/Behavioral: Positive for sleep disturbance. The patient is nervous/anxious.        Objective:   Physical Exam  Cardiovascular: Normal rate, regular rhythm and normal heart sounds.   Pulmonary/Chest: Effort normal and breath sounds normal. No respiratory distress. She has no wheezes. She has no rales.  Abdominal: Soft. Bowel sounds are normal. She exhibits no distension and no mass. There is no tenderness. There is no rebound and no guarding.  Musculoskeletal: Inspection of hands reveals that there is no swelling or erythema present.   No thenar or hypothenar wasting.  Phalens and median nerve compression tests negative.  Patient exhibits tenderness at all MCP, PIP, and DIP joints in both hands. Patient also exhibits tenderness in both knees and both ankles.  ROM limited by tenderness.  Grip strength decreased 2/2 to reported pain.    Knee exam: Normal to inspection with no erythema or effusion or obvious bony abnormalities bilaterally. Palpation with mild joint line tenderness bilaterally with no warmth  or patellar tenderness or condyle tenderness. ROM normal in flexion and extension and lower leg rotation. Ligaments with solid consistent endpoints including ACL, PCL, LCL, MCL. Negative Mcmurray's and provocative meniscal tests. Non painful patellar compression. Patellar and quadriceps tendons unremarkable. Hamstring and quadriceps strength is normal.  Gait: Walking gait is normal, no favoring and without limp.  Assessment & Plan:

## 2010-10-14 NOTE — Progress Notes (Signed)
RX for trazodone failed in attempt to send electronically to pharmacy. RN called pharmacy and gave Rx verbally.

## 2010-10-14 NOTE — Assessment & Plan Note (Signed)
Check FLP, she will return to have drawn when fasting.

## 2010-10-14 NOTE — Patient Instructions (Addendum)
It was nice meeting you today.  I would like for you to take 1/2 of your amitriptyline for the next 5 days then discontinue.  I would like for you to start taking the trazodone once you are off the amitriptyline.  I will fax you paperwork to your lawyer when it is complete.  I would like for you to come in for your lab work one morning before you have eaten.  I would like to see you back in two weeks to see how the new sleep medicine is doing for you.  If you have questions please call our office.

## 2010-10-14 NOTE — Assessment & Plan Note (Signed)
Does not feel like amitriptyline is helping with sleep or depression.  I will wean her from this and start trazodone for sleep and adjunct to her zoloft, will start low dose and likely need to titrate up.

## 2010-10-19 ENCOUNTER — Ambulatory Visit (HOSPITAL_COMMUNITY)
Admission: RE | Admit: 2010-10-19 | Discharge: 2010-10-19 | Disposition: A | Discharge status: Home / Self Care | Payer: Self-pay | Source: Ambulatory Visit | Attending: Family Medicine | Admitting: Family Medicine

## 2010-10-19 DIAGNOSIS — M25569 Pain in unspecified knee: Secondary | ICD-10-CM | POA: Insufficient documentation

## 2010-10-19 DIAGNOSIS — M79609 Pain in unspecified limb: Secondary | ICD-10-CM | POA: Insufficient documentation

## 2010-10-19 DIAGNOSIS — M159 Polyosteoarthritis, unspecified: Secondary | ICD-10-CM

## 2010-10-20 ENCOUNTER — Telehealth: Payer: Self-pay | Admitting: Family Medicine

## 2010-10-20 NOTE — Telephone Encounter (Signed)
Will forward to Dr Matthews 

## 2010-10-20 NOTE — Telephone Encounter (Signed)
Pt is not doing well on this new antidepressant.  She does not have any more money to get more, so she is asking to either double up on what she has or go back to the original one that she still has more of.  pls advise Pt is very teary and can't sleep.

## 2010-10-20 NOTE — Telephone Encounter (Signed)
Returned call to patient.  She has weaned from amitriptyline and is taking zoloft and trazodone.  Not sleeping as well with trazodone.  Told her she could increase to 100mg  at bedtime and that we could still go up from there if needed.  She says the zoloft is working well for her during the day.  She will call if still unable to sleep.

## 2010-10-21 ENCOUNTER — Encounter: Payer: Self-pay | Admitting: Family Medicine

## 2010-10-22 ENCOUNTER — Telehealth: Payer: Self-pay | Admitting: Family Medicine

## 2010-10-22 NOTE — Telephone Encounter (Signed)
Ms. Clausing is calling to let Dr. Ashley Royalty know that she took his advice and tried the 100mg , but that didn't really help so last night she took 150mg  and she is feeling better than she has felt all week.  She wants to know if it is ok to continue with the 150mg .

## 2010-10-22 NOTE — Telephone Encounter (Signed)
Fwd. To Dr.Matthews for advise .[name]

## 2010-10-22 NOTE — Telephone Encounter (Signed)
It will be fine for her to continue with the 150mg .  That was my plan if she was still having difficulty.

## 2010-10-22 NOTE — Telephone Encounter (Signed)
Unable to leave message. Waiting for pt to call back. See Dr.Matthew's message. Lorenda Hatchet, Renato Battles

## 2010-10-27 ENCOUNTER — Ambulatory Visit: Payer: Self-pay | Admitting: Family Medicine

## 2010-10-30 LAB — CBC
HCT: 32.2 — ABNORMAL LOW
MCHC: 34.3
MCV: 92.6
Platelets: 261
RBC: 3.47 — ABNORMAL LOW

## 2010-10-30 LAB — BASIC METABOLIC PANEL
BUN: 5 — ABNORMAL LOW
CO2: 25
Chloride: 103
Creatinine, Ser: 0.78
Potassium: 3.2 — ABNORMAL LOW

## 2010-10-30 LAB — DIFFERENTIAL
Basophils Relative: 2 — ABNORMAL HIGH
Eosinophils Absolute: 0.2
Eosinophils Relative: 2
Lymphs Abs: 1.9
Monocytes Relative: 5
Neutrophils Relative %: 65

## 2010-11-02 ENCOUNTER — Telehealth: Payer: Self-pay | Admitting: Obstetrics and Gynecology

## 2010-11-02 ENCOUNTER — Other Ambulatory Visit: Payer: Self-pay | Admitting: Family Medicine

## 2010-11-02 NOTE — Telephone Encounter (Signed)
Needs several refills called in to [facility] in [place].  She was told she had no refills left on Zoloft, and the BP and Arthritis medication.  She also needs an inhaler but would like one that is inexpensive.

## 2010-11-02 NOTE — Telephone Encounter (Signed)
Fwd. To PCP for refills. .[name]  

## 2010-11-02 NOTE — Telephone Encounter (Signed)
Sent in refills for HCTZ and zoloft.  Refilled meloxicam on 9/26 with three refills, she should not need this yet.  Albuterol is only available as HFA now, so no generic available.  Unfortunately there is nothing cheaper.

## 2010-11-02 NOTE — Telephone Encounter (Signed)
Refill request

## 2010-11-03 ENCOUNTER — Telehealth: Payer: Self-pay | Admitting: Family Medicine

## 2010-11-03 ENCOUNTER — Emergency Department (HOSPITAL_COMMUNITY)
Admission: EM | Admit: 2010-11-03 | Discharge: 2010-11-03 | Disposition: A | Discharge status: Home / Self Care | Payer: Self-pay | Attending: Emergency Medicine | Admitting: Emergency Medicine

## 2010-11-03 ENCOUNTER — Encounter (HOSPITAL_COMMUNITY): Payer: Self-pay | Admitting: *Deleted

## 2010-11-03 DIAGNOSIS — I10 Essential (primary) hypertension: Secondary | ICD-10-CM | POA: Insufficient documentation

## 2010-11-03 DIAGNOSIS — M25569 Pain in unspecified knee: Secondary | ICD-10-CM | POA: Insufficient documentation

## 2010-11-03 DIAGNOSIS — F3289 Other specified depressive episodes: Secondary | ICD-10-CM | POA: Insufficient documentation

## 2010-11-03 DIAGNOSIS — M25559 Pain in unspecified hip: Secondary | ICD-10-CM | POA: Insufficient documentation

## 2010-11-03 DIAGNOSIS — F329 Major depressive disorder, single episode, unspecified: Secondary | ICD-10-CM | POA: Insufficient documentation

## 2010-11-03 DIAGNOSIS — Z859 Personal history of malignant neoplasm, unspecified: Secondary | ICD-10-CM | POA: Insufficient documentation

## 2010-11-03 DIAGNOSIS — E785 Hyperlipidemia, unspecified: Secondary | ICD-10-CM | POA: Insufficient documentation

## 2010-11-03 DIAGNOSIS — K219 Gastro-esophageal reflux disease without esophagitis: Secondary | ICD-10-CM | POA: Insufficient documentation

## 2010-11-03 DIAGNOSIS — M159 Polyosteoarthritis, unspecified: Secondary | ICD-10-CM | POA: Insufficient documentation

## 2010-11-03 HISTORY — DX: Malignant (primary) neoplasm, unspecified: C80.1

## 2010-11-03 MED ORDER — HYDROCODONE-ACETAMINOPHEN 5-325 MG PO TABS: ORAL_TABLET | ORAL | Status: DC

## 2010-11-03 MED ORDER — HYDROCODONE-ACETAMINOPHEN 5-325 MG PO TABS
2.0000 | ORAL_TABLET | Freq: Once | ORAL | Status: AC
  Administered 2010-11-03: 2 via ORAL
  Filled 2010-11-03: qty 2

## 2010-11-03 MED ORDER — DEXAMETHASONE 6 MG PO TABS: ORAL_TABLET | ORAL | Status: AC

## 2010-11-03 MED ORDER — DEXAMETHASONE SODIUM PHOSPHATE 4 MG/ML IJ SOLN
8.0000 mg | Freq: Once | INTRAMUSCULAR | Status: AC
  Administered 2010-11-03: 8 mg via INTRAMUSCULAR
  Filled 2010-11-03 (×2): qty 2

## 2010-11-03 NOTE — ED Provider Notes (Signed)
History     CSN: [id number] Arrival date & time: 11/03/2010  9:16 AM  Chief Complaint  Patient presents with  . Knee Pain    (Consider location/radiation/quality/duration/timing/severity/associated sxs/prior treatment) Patient is a 50 y.o. female presenting with knee pain. The history is provided by the patient.  Knee Pain This is a recurrent problem. The problem occurs daily. The problem has been gradually worsening. Associated symptoms include arthralgias and myalgias. Pertinent negatives include no abdominal pain, chest pain, coughing or neck pain. The symptoms are aggravated by standing, walking and bending. She has tried NSAIDs for the symptoms. The treatment provided mild relief.    Past Medical History  Diagnosis Date  . HTN (hypertension)   . HLD (hyperlipidemia)   . Osteoarthrosis involving more than one site but not generalized   . Depression   . GERD (gastroesophageal reflux disease)   . Cancer     Past Surgical History  Procedure Date  . Tubal ligation   . Abdominal hysterectomy   . Abdominal surgery   . Lung cancer surgery   . Bladder surgery     x 4    History reviewed. No pertinent family history.  History  Substance Use Topics  . Smoking status: Former Smoker    Quit date: 04/07/1995  . Smokeless tobacco: Not on file  . Alcohol Use: No    OB History    Grav Para Term Preterm Abortions TAB SAB Ect Mult Living                  Review of Systems  Constitutional: Negative for activity change.       All ROS Neg except as noted in HPI  HENT: Negative for nosebleeds and neck pain.   Eyes: Negative for photophobia and discharge.  Respiratory: Negative for cough, shortness of breath and wheezing.   Cardiovascular: Negative for chest pain and palpitations.  Gastrointestinal: Negative for abdominal pain and blood in stool.  Genitourinary: Negative for dysuria, frequency and hematuria.  Musculoskeletal: Positive for myalgias and arthralgias. Negative  for back pain.  Skin: Negative.   Neurological: Negative for dizziness, seizures and speech difficulty.  Psychiatric/Behavioral: Negative for hallucinations and confusion.    Allergies  Promethazine hcl  Home Medications   Current Outpatient Rx  Name Route Sig Dispense Refill  . ALBUTEROL SULFATE HFA 108 (90 BASE) MCG/ACT IN AERS  2 p every 4 hours as needed for breathing     . AMITRIPTYLINE HCL 150 MG PO TABS Oral Take 150 mg by mouth at bedtime.      Marland Kitchen CALCIUM CITRATE-VITAMIN D 250-200 MG-UNIT PO TABS  Take 1 tablet by mouth once a day     . HYDROCHLOROTHIAZIDE 25 MG PO TABS  TAKE ONE TABLET BY MOUTH EVERY DAY 90 tablet 1  . MELOXICAM 15 MG PO TABS Oral Take 1 tablet (15 mg total) by mouth daily. 30 tablet 3  . SERTRALINE HCL 100 MG PO TABS  TAKE ONE TABLET BY MOUTH EVERY DAY 90 tablet 3  . TRAZODONE HCL 50 MG PO TABS Oral Take 1 tablet (50 mg total) by mouth at bedtime. 60 tablet 1    BP 130/64  Pulse 66  Temp(Src) 98.1 F (36.7 C) (Oral)  Resp 16  Ht 5\' 6"  (1.676 m)  Wt 191 lb (86.637 kg)  BMI 30.83 kg/m2  SpO2 97%  Physical Exam  Nursing note and vitals reviewed. Constitutional: She is oriented to person, place, and time. She appears well-developed and well-nourished.  Non-toxic appearance.  HENT:  Head: Normocephalic.  Right Ear: Tympanic membrane and external ear normal.  Left Ear: Tympanic membrane and external ear normal.  Eyes: EOM and lids are normal. Pupils are equal, round, and reactive to light.  Neck: Normal range of motion. Neck supple. Carotid bruit is not present.  Cardiovascular: Normal rate, regular rhythm, normal heart sounds, intact distal pulses and normal pulses.   Pulmonary/Chest: Breath sounds normal. No respiratory distress.  Abdominal: Soft. Bowel sounds are normal. There is no tenderness. There is no guarding.  Musculoskeletal: Normal range of motion.       Pain with ROM of the right hip. Pain with ROM left knee. No effusion. Not hot. Distal  pulses symmetrical.  Lymphadenopathy:       Head (right side): No submandibular adenopathy present.       Head (left side): No submandibular adenopathy present.    She has no cervical adenopathy.  Neurological: She is alert and oriented to person, place, and time. She has normal strength. No cranial nerve deficit or sensory deficit.  Skin: Skin is warm and dry.  Psychiatric: She has a normal mood and affect. Her speech is normal.    ED Course  Procedures (including critical care time)  Labs Reviewed - No data to display No results found.   No diagnosis found.    MDM  I have reviewed nursing notes, vital signs, and all appropriate lab and imaging results for this patient.        Kathie Dike, [place] 11/14/10 0005

## 2010-11-03 NOTE — Telephone Encounter (Signed)
Tried to call pt. 'voice mail box not set up yet'. [name], [name]

## 2010-11-03 NOTE — ED Notes (Signed)
Pt c/o pain in her left knee and right hip. Pt states that she has not injured herself.

## 2010-11-03 NOTE — Telephone Encounter (Signed)
Not a good connection.  The patient kept cutting in and out and then I lost her completely.  What I did get is that she wants to talk to Dr. Ashley Royalty about changing a medication, but I could not get which medication.  I did let her know that HCTZ and Zoloft were sent to [facility] in [place] yesterday.

## 2010-11-04 ENCOUNTER — Telehealth: Payer: Self-pay | Admitting: *Deleted

## 2010-11-04 NOTE — Telephone Encounter (Signed)
Received call from patient stating she does not like the trazodone and plans to not take it anymore. Plans to go back to amitriptyline.  she has 12 pills left and needs refill on this. She feels trazodone caused constipation , stomach to hurt, increased depression and anxiety and she felt irritable.   also patient brought in a form for lawyer for Dr. Ashley Royalty to fill out at her last visit 10/14/2010. Lawyer has not received.    she went to ED yesterday for pain in knee and hip and arthritis. Will forward to MD.

## 2010-11-05 NOTE — Telephone Encounter (Signed)
Patient says she is no longer taking trazodone because of the way it makes her feel. She started back taking amitriptyline and only has 10 left and is requesting a refill be sent to [facility] in [place]. Also she is asking about some paperwork that was sent from her lawyer?

## 2010-11-06 ENCOUNTER — Ambulatory Visit (HOSPITAL_COMMUNITY)
Admission: RE | Admit: 2010-11-06 | Discharge: 2010-11-06 | Disposition: A | Discharge status: Home / Self Care | Payer: Self-pay | Source: Ambulatory Visit | Attending: Obstetrics and Gynecology | Admitting: Obstetrics and Gynecology

## 2010-11-06 DIAGNOSIS — Z1231 Encounter for screening mammogram for malignant neoplasm of breast: Secondary | ICD-10-CM | POA: Insufficient documentation

## 2010-11-06 DIAGNOSIS — Z139 Encounter for screening, unspecified: Secondary | ICD-10-CM

## 2010-11-06 MED ORDER — AMITRIPTYLINE HCL 150 MG PO TABS: 150.0000 mg | ORAL_TABLET | Freq: Every day | ORAL | Status: DC

## 2010-11-06 NOTE — Telephone Encounter (Signed)
Dr. Ashley Royalty sent in refill for amitriptyline. States he still is trying to gather information to fill out papers for lawyer. Patient notified.

## 2010-11-07 NOTE — Telephone Encounter (Signed)
Paperwork completed and faxed to lawyer.

## 2010-11-14 NOTE — ED Provider Notes (Signed)
Medical screening examination/treatment/procedure(s) were performed by non-physician practitioner and as supervising physician I was immediately available for consultation/collaboration.   Horris Speros M Dinita Migliaccio, DO 11/14/10 1104 

## 2011-02-12 ENCOUNTER — Other Ambulatory Visit: Payer: Self-pay | Admitting: Family Medicine

## 2011-02-12 NOTE — Telephone Encounter (Signed)
Refill request

## 2011-02-16 ENCOUNTER — Other Ambulatory Visit: Payer: Self-pay | Admitting: Family Medicine

## 2011-02-16 MED ORDER — AMITRIPTYLINE HCL 150 MG PO TABS: 150.0000 mg | ORAL_TABLET | Freq: Every day | ORAL | Status: DC

## 2011-03-05 ENCOUNTER — Other Ambulatory Visit: Payer: Self-pay | Admitting: Family Medicine

## 2011-03-06 NOTE — Telephone Encounter (Signed)
Refill request

## 2011-03-31 ENCOUNTER — Ambulatory Visit (INDEPENDENT_AMBULATORY_CARE_PROVIDER_SITE_OTHER): Payer: Self-pay | Admitting: Family Medicine

## 2011-03-31 ENCOUNTER — Encounter: Payer: Self-pay | Admitting: Family Medicine

## 2011-03-31 VITALS — BP 146/79 | HR 87 | Ht 66.0 in | Wt 190.0 lb

## 2011-03-31 DIAGNOSIS — M654 Radial styloid tenosynovitis [de Quervain]: Secondary | ICD-10-CM

## 2011-03-31 MED ORDER — KETOPROFEN POWD: Status: DC

## 2011-03-31 NOTE — Patient Instructions (Signed)
Thank you for coming in today, it was good to see you You can try aspercreme on your wrist to see if that helps, if not you can try the prescription cream. The pharmacies that have this are [facility], [facility], [facility]. If your pain is not improving in a couple of weeks, come back and we can try injecting that area.

## 2011-04-04 NOTE — Progress Notes (Signed)
  Subjective:    Patient ID: Judi Cong, female    DOB: 1960/04/17, 51 y.o.   MRN: [id number]  HPI 1. Wrist pain:  Has had pain in L wrist x2 days.  Hurts with thumb movements and moving had from side to side.  No trauma to area.  Has not been using more than usual.  Some swelling along wrist, has not been using anything for pain.     Review of Systems No fevers/chills, no pain more proximally, denies numbness/tingling in hand.    Objective:   Physical Exam  Constitutional: She appears well-developed and well-nourished. No distress.  Musculoskeletal:       Left wrist: Normal to inspection, no swelling noted There is tenderness along the radial aspect of the wrist.  Minimal pain at [facility] joint of thumb  FROM in thumb but there is pain with flexion and + finkelstein test          Assessment & Plan:

## 2011-04-04 NOTE — Assessment & Plan Note (Signed)
Exam and symptoms consistent with [name] of L Thumb.  Given options of injection versus topical anti-inflammatory.  WOuld like to try more conservative treatment for now.  Given rx for ketoprofen gel, told she could try aspercream first if ketoprofen is too expensive.  If no improvement in two weeks can return to discuss injection.

## 2011-04-09 ENCOUNTER — Other Ambulatory Visit: Payer: Self-pay | Admitting: Family Medicine

## 2011-04-22 ENCOUNTER — Telehealth: Payer: Self-pay | Admitting: *Deleted

## 2011-04-22 ENCOUNTER — Telehealth: Payer: Self-pay | Admitting: Family Medicine

## 2011-04-22 NOTE — Telephone Encounter (Signed)
Please tell pt to sched. OV with Dr.Matthews. [name], [name]

## 2011-04-22 NOTE — Telephone Encounter (Signed)
Is getting her paperwork together for disability and they are asking when all her meds were started - when they were prescribed to her.

## 2011-04-29 ENCOUNTER — Ambulatory Visit: Payer: Self-pay | Admitting: Family Medicine

## 2011-05-03 ENCOUNTER — Other Ambulatory Visit: Payer: Self-pay | Admitting: Family Medicine

## 2011-06-15 ENCOUNTER — Other Ambulatory Visit: Payer: Self-pay | Admitting: *Deleted

## 2011-06-15 ENCOUNTER — Other Ambulatory Visit: Payer: Self-pay | Admitting: Family Medicine

## 2011-06-15 MED ORDER — MELOXICAM 15 MG PO TABS: 15.0000 mg | ORAL_TABLET | Freq: Every day | ORAL | Status: DC

## 2011-07-24 ENCOUNTER — Emergency Department (HOSPITAL_COMMUNITY)
Admission: EM | Admit: 2011-07-24 | Discharge: 2011-07-24 | Disposition: A | Discharge status: Home / Self Care | Payer: Medicare Other | Attending: Emergency Medicine | Admitting: Emergency Medicine

## 2011-07-24 ENCOUNTER — Encounter (HOSPITAL_COMMUNITY): Payer: Self-pay | Admitting: *Deleted

## 2011-07-24 DIAGNOSIS — F3289 Other specified depressive episodes: Secondary | ICD-10-CM | POA: Insufficient documentation

## 2011-07-24 DIAGNOSIS — E785 Hyperlipidemia, unspecified: Secondary | ICD-10-CM | POA: Insufficient documentation

## 2011-07-24 DIAGNOSIS — X58XXXA Exposure to other specified factors, initial encounter: Secondary | ICD-10-CM | POA: Insufficient documentation

## 2011-07-24 DIAGNOSIS — IMO0002 Reserved for concepts with insufficient information to code with codable children: Secondary | ICD-10-CM | POA: Diagnosis not present

## 2011-07-24 DIAGNOSIS — I10 Essential (primary) hypertension: Secondary | ICD-10-CM | POA: Insufficient documentation

## 2011-07-24 DIAGNOSIS — Z85118 Personal history of other malignant neoplasm of bronchus and lung: Secondary | ICD-10-CM | POA: Insufficient documentation

## 2011-07-24 DIAGNOSIS — F329 Major depressive disorder, single episode, unspecified: Secondary | ICD-10-CM | POA: Diagnosis not present

## 2011-07-24 DIAGNOSIS — M1990 Unspecified osteoarthritis, unspecified site: Secondary | ICD-10-CM | POA: Insufficient documentation

## 2011-07-24 DIAGNOSIS — Z87891 Personal history of nicotine dependence: Secondary | ICD-10-CM | POA: Insufficient documentation

## 2011-07-24 DIAGNOSIS — K219 Gastro-esophageal reflux disease without esophagitis: Secondary | ICD-10-CM | POA: Insufficient documentation

## 2011-07-24 DIAGNOSIS — T148XXA Other injury of unspecified body region, initial encounter: Secondary | ICD-10-CM

## 2011-07-24 LAB — URINALYSIS, ROUTINE W REFLEX MICROSCOPIC
Glucose, UA: NEGATIVE mg/dL
Ketones, ur: NEGATIVE mg/dL
Protein, ur: NEGATIVE mg/dL
Urobilinogen, UA: 0.2 mg/dL (ref 0.0–1.0)

## 2011-07-24 LAB — URINE MICROSCOPIC-ADD ON

## 2011-07-24 MED ORDER — HYDROCODONE-ACETAMINOPHEN 5-325 MG PO TABS: 1.0000 | ORAL_TABLET | Freq: Four times a day (QID) | ORAL | Status: AC | PRN

## 2011-07-24 MED ORDER — HYDROCODONE-ACETAMINOPHEN 5-325 MG PO TABS
1.0000 | ORAL_TABLET | Freq: Once | ORAL | Status: AC
  Administered 2011-07-24: 1 via ORAL
  Filled 2011-07-24: qty 1

## 2011-07-24 NOTE — ED Provider Notes (Signed)
Medical screening examination/treatment/procedure(s) were performed by non-physician practitioner and as supervising physician I was immediately available for consultation/collaboration.   Gorge Almanza W Ercil Cassis, MD 07/24/11 1614 

## 2011-07-24 NOTE — ED Provider Notes (Signed)
History     CSN: [id number]  Arrival date & time 07/24/11  4098   First MD Initiated Contact with Patient 07/24/11 1013      Chief Complaint  Patient presents with  . Abdominal Pain    (Consider location/radiation/quality/duration/timing/severity/associated sxs/prior treatment) HPI Comments: Pt has been having intermittent, sharp lower abdominal pain and lower back pain for the past month and worse in the past few days.  No UTI sxs.  No h/o kidney stones.  No n/v/d.  No fever or chills.  States she only hurts when she moves or walks.  She also notes that she has a 45 lb grandchild that she is frequently picking up and carrying around.  She has had 4 surgeries involving a bladder tack.  She has had a complete hysterectomy.  Patient is a 51 y.o. female presenting with abdominal pain. The history is provided by the patient. No language interpreter was used.  Abdominal Pain The primary symptoms of the illness include abdominal pain. The primary symptoms of the illness do not include fever, nausea, vomiting, diarrhea or dysuria. Episode onset: 1 month ago. The onset of the illness was gradual.  Additional symptoms associated with the illness include back pain. Symptoms associated with the illness do not include chills, urgency, hematuria or frequency.    Past Medical History  Diagnosis Date  . HTN (hypertension)   . HLD (hyperlipidemia)   . Osteoarthrosis involving more than one site but not generalized   . Depression   . GERD (gastroesophageal reflux disease)   . Cancer     lung    Past Surgical History  Procedure Date  . Tubal ligation   . Abdominal hysterectomy   . Abdominal surgery   . Lung cancer surgery   . Bladder surgery     x 4  . Lobectomy     No family history on file.  History  Substance Use Topics  . Smoking status: Former Smoker    Quit date: 04/07/1995  . Smokeless tobacco: Not on file  . Alcohol Use: No    OB History    Grav Para Term Preterm  Abortions TAB SAB Ect Mult Living                  Review of Systems  Constitutional: Negative for fever and chills.  Gastrointestinal: Positive for abdominal pain. Negative for nausea, vomiting and diarrhea.  Genitourinary: Negative for dysuria, urgency, frequency, hematuria and flank pain.  Musculoskeletal: Positive for back pain.  All other systems reviewed and are negative.    Allergies  Promethazine hcl  Home Medications   Current Outpatient Rx  Name Route Sig Dispense Refill  . ALBUTEROL SULFATE HFA 108 (90 BASE) MCG/ACT IN AERS Inhalation Inhale 2 puffs into the lungs every 4 (four) hours as needed. for shortness of breath    . AMITRIPTYLINE HCL 150 MG PO TABS  TAKE ONE TABLET BY MOUTH EVERY DAY AT BEDTIME 30 tablet 6  . CALCIUM CITRATE-VITAMIN D 250-200 MG-UNIT PO TABS Oral Take 1 tablet by mouth daily.     Marland Kitchen HYDROCHLOROTHIAZIDE 25 MG PO TABS  TAKE ONE TABLET BY MOUTH EVERY DAY 90 tablet 1  . MELOXICAM 15 MG PO TABS Oral Take 1 tablet (15 mg total) by mouth daily. 30 tablet 3  . RANITIDINE HCL 150 MG PO TABS Oral Take 150 mg by mouth 2 (two) times daily as needed. Takes every morning and at bedtime if needed    . SERTRALINE HCL  100 MG PO TABS Oral Take 100 mg by mouth daily.     . TROLAMINE SALICYLATE 10 % EX CREA Topical Apply 1 application topically as needed. For arthritis    . HYDROCODONE-ACETAMINOPHEN 5-325 MG PO TABS Oral Take 1 tablet by mouth every 6 (six) hours as needed for pain. 12 tablet 0    BP 138/92  Pulse 83  Temp 98.2 F (36.8 C) (Oral)  Resp 16  Ht 5\' 6"  (1.676 m)  Wt 190 lb (86.183 kg)  BMI 30.67 kg/m2  SpO2 98%  Physical Exam  Nursing note and vitals reviewed. Constitutional: She is oriented to person, place, and time. She appears well-developed and well-nourished. No distress.  HENT:  Head: Normocephalic and atraumatic.  Eyes: EOM are normal.  Neck: Normal range of motion.  Cardiovascular: Normal rate, regular rhythm and normal heart  sounds.   Pulmonary/Chest: Effort normal and breath sounds normal.  Abdominal: Soft. She exhibits no distension. There is tenderness in the suprapubic area. There is no rigidity, no guarding, no tenderness at McBurney's point and negative Murphy's sign.    Musculoskeletal: Normal range of motion.       Lumbar back: She exhibits tenderness. She exhibits normal range of motion and no bony tenderness.       Back:  Neurological: She is alert and oriented to person, place, and time.  Skin: Skin is warm and dry.  Psychiatric: She has a normal mood and affect. Judgment normal.    ED Course  Procedures (including critical care time)  Labs Reviewed  URINALYSIS, ROUTINE W REFLEX MICROSCOPIC - Abnormal; Notable for the following:    APPearance HAZY (*)     Specific Gravity, Urine <1.005 (*)     Hgb urine dipstick MODERATE (*)     All other components within normal limits  URINE MICROSCOPIC-ADD ON - Abnormal; Notable for the following:    Squamous Epithelial / LPF FEW (*)     All other components within normal limits   No results found.   1. Muscle strain       MDM  Exam c/w muscle strain.  U/a wnl.  Pt to f/u with PCP prn or return to ED if sxs worsen or change.        Evalina Field, [place] 07/24/11 1155

## 2011-07-24 NOTE — ED Notes (Signed)
Pt states intermittent lower abdominal pain and lower back pain x 1 mo, nausea at times also.

## 2011-09-03 ENCOUNTER — Encounter (HOSPITAL_COMMUNITY): Payer: Self-pay | Admitting: Psychiatry

## 2011-09-03 ENCOUNTER — Ambulatory Visit (INDEPENDENT_AMBULATORY_CARE_PROVIDER_SITE_OTHER): Payer: Self-pay | Admitting: Psychiatry

## 2011-09-03 DIAGNOSIS — F4321 Adjustment disorder with depressed mood: Secondary | ICD-10-CM

## 2011-09-03 NOTE — Progress Notes (Signed)
Psychiatric Assessment Adult  Patient Identification:  [name] Date of Evaluation:  09/03/2011 Chief Complaint: Here because my lawyer sent me about stress History of Chief Complaint:  No chief complaint on file. this patient is a 51 year old African American married mother. She was sent to be evaluated by her lawyer as she is getting prepared to go to a disability hearing. She claims that her lung cancer was produced by stress and at that time she did not see psychological care. She suggests that she should have seen care but by no way means implies it would have affected the result of her lung cancer peer she had a portion of her lung removed and according to her is cured from her lung cancer. The patient has a dysfunctional relationship with her husband. She has 3 daughters 2 of who came from a man who presently is in prison. The patient describes significant financial stress. She describes the stress of her physical limitations mainly related for her arthritis in her legs and her hands. She says it's difficult for her to be a burden to others yet in reality she claims only 2/7 days does she need any assistance. Otherwise she functions independently. As all her basic ADLs. Her sister and helps her out a great deal. I suspect her husband is not optimally supportive. This patient denies depression. She denies anxiety. She is sleeping fairly well with the help of Elavil at night. The patient is eating well has good energy and has no problems concentrating. She enjoys caring for her grandchildren reading the Bible watching TV and reading multiple magazines. She's never been suicidal. She denies the use of alcohol or drugs. Specifically she denies ever having a clear episode of major depression. She denies any symptoms of mania. Today she denies any history of having symptoms consistent with generalized anxiety disorder, panic disorder or excessive compulsive disorder. At this time she is at her baseline.  Her biggest stress is her financial stress and the pressure she feels from her husband to gain some financial input. HPI Review of Systems Physical Exam  Depressive Symptoms: anxiety,  (Hypo) Manic Symptoms:   Elevated Mood:  No Irritable Mood:  No Grandiosity:  No Distractibility:  No Labiality of Mood:  No Delusions:  No Hallucinations:  No Impulsivity:  No Sexually Inappropriate Behavior:  No Financial Extravagance:  No Flight of Ideas:  No  Anxiety Symptoms: Excessive Worry:  No Panic Symptoms:  No Agoraphobia:  No Obsessive Compulsive: No  Symptoms: None, Specific Phobias:  No Social Anxiety:  No  Psychotic Symptoms:  Hallucinations: No None Delusions:  No Paranoia:  No   Ideas of Reference:  No  PTSD Symptoms: Ever had a traumatic exposure:  No Had a traumatic exposure in the last month:  No Re-experiencing: No None Hypervigilance:  No Hyperarousal: No None Avoidance: No None  Traumatic Brain Injury: No   Past Psychiatric History:none Diagnosis: Adjustment disorder with depression  Hospitalizations:   Outpatient Care:   Substance Abuse Care:   Self-Mutilation:   Suicidal Attempts:   Violent Behaviors:    Past Medical History:   Past Medical History  Diagnosis Date  . HTN (hypertension)   . HLD (hyperlipidemia)   . Osteoarthrosis involving more than one site but not generalized   . Depression   . GERD (gastroesophageal reflux disease)   . Cancer     lung   History of Loss of Consciousness:  No Seizure History:  No Cardiac History:  No Allergies:  Allergies  Allergen Reactions  . Promethazine Hcl Nausea And Vomiting and Other (See Comments)    "Makes my head hurt really bad too"   Current Medications:  Current Outpatient Prescriptions  Medication Sig Dispense Refill  . albuterol (PROAIR HFA) 108 (90 BASE) MCG/ACT inhaler Inhale 2 puffs into the lungs every 4 (four) hours as needed. for shortness of breath      . amitriptyline (ELAVIL)  150 MG tablet TAKE ONE TABLET BY MOUTH EVERY DAY AT BEDTIME  30 tablet  6  . Calcium Citrate-Vitamin D 250-200 MG-UNIT TABS Take 1 tablet by mouth daily.       . hydrochlorothiazide (HYDRODIURIL) 25 MG tablet TAKE ONE TABLET BY MOUTH EVERY DAY  90 tablet  1  . meloxicam (MOBIC) 15 MG tablet Take 1 tablet (15 mg total) by mouth daily.  30 tablet  3  . ranitidine (ZANTAC) 150 MG tablet Take 150 mg by mouth 2 (two) times daily as needed. Takes every morning and at bedtime if needed      . sertraline (ZOLOFT) 100 MG tablet Take 100 mg by mouth daily.       Marland Kitchen trolamine salicylate (ASPERCREME) 10 % cream Apply 1 application topically as needed. For arthritis        Previous Psychotropic Medications:  Medication Dose   Zoloft  100mg   Elavil 150                  Substance Abuse History in the last 12 months:                                                                                                   Medical Consequences of Substance Abuse:   Legal Consequences of Substance Abuse:   Family Consequences of Substance Abuse:   Blackouts:   DT's:   Withdrawal Symptoms:   None  Social History: Current Place of Residence: [place] of Birth: Family Members:  Marital Status:  Married Children: 3  Daughters: Relationships:  Education:  8thn  grade Educational Problems/Performance:  Religious Beliefs/Practices:  History of Abuse: none Teacher, music History:  None. Legal History:  Hobbies/Interests:   Family History:  No family history on file.  Mental Status Examination/Evaluation: Objective:  Appearance: Casual  Eye Contact::  Good  Speech:  Clear and Coherent  Volume:  Normal  Mood:  anxiety  Affect:  Appropriate  Thought Process:  Logical  Orientation:  Full  Thought Content:  WDL  Suicidal Thoughts:  No  Homicidal Thoughts:  No  Judgement:  Good  Insight:  Good  Psychomotor Activity:  Normal  Akathisia:  No  Handed:   Right  AIMS (if indicated):    Assets:  Desire for Improvement    Laboratory/X-Ray Psychological Evaluation(s)        Assessment:  Axis I: Adjustment Disorder with Depressed Mood  AXIS I Adjustment Disorder with Depressed Mood  AXIS II No diagnosis  AXIS III Past Medical History  Diagnosis Date  . HTN (hypertension)   . HLD (hyperlipidemia)   . Osteoarthrosis involving more than one site but  not generalized   . Depression   . GERD (gastroesophageal reflux disease)   . Cancer     lung     AXIS IV economic problems  AXIS V 61-70 mild symptoms   Treatment Plan/Recommendations:  Plan of Care: at this time it is my opinion that this patient does not have a major mental illness and never has. She's had typical stresses mainly related to her mother's death in 07-04-96 and to having a life-threatening illness that of lung cancer. She has no evidence of posttraumatic stress disorder. She is mild appropriate anxiety and feels typical guilt about being a burden for anybody else her by not helping her husband with their income. She has had a number of surgeries over the years mainly related to her bladder. At this time she doesn't describe any disability physically from her bladder condition but does describe chronic small to moderate pain from her arthritic condition. At this time I do not believe there is a psychiatric condition that is contributing to any disability in this patient. I do not believe there is an indication for any specific type of psychotherapy although I think any individual with a chronic medical condition might benefit from supportive therapy specifically by a social worker in the medical setting. Therefore I asked her that when she sees her medical doctor again to ask if there any possible services in her medical office to talk to a social worker about dealing with her medical condition. As patient was not given a return appointment .  Laboratory:    Psychotherapy:     Medications: Zoloft 100, elavil 150  Routine PRN Medications:    Consultations:   Safety Concerns:    Other:      Lucas Mallow, MD 8/16/20139:13 AM

## 2011-10-01 ENCOUNTER — Other Ambulatory Visit: Payer: Self-pay | Admitting: Family Medicine

## 2011-10-14 ENCOUNTER — Other Ambulatory Visit: Payer: Self-pay | Admitting: Family Medicine

## 2011-11-05 ENCOUNTER — Other Ambulatory Visit: Payer: Self-pay | Admitting: Family Medicine

## 2011-11-20 ENCOUNTER — Other Ambulatory Visit: Payer: Self-pay | Admitting: Family Medicine

## 2012-01-22 ENCOUNTER — Other Ambulatory Visit: Payer: Self-pay | Admitting: Family Medicine

## 2012-02-06 ENCOUNTER — Other Ambulatory Visit: Payer: Self-pay | Admitting: Family Medicine

## 2012-02-10 ENCOUNTER — Ambulatory Visit: Payer: Self-pay | Admitting: Family Medicine

## 2012-02-14 ENCOUNTER — Ambulatory Visit (INDEPENDENT_AMBULATORY_CARE_PROVIDER_SITE_OTHER): Payer: Medicare Other | Admitting: Family Medicine

## 2012-02-14 ENCOUNTER — Encounter: Payer: Self-pay | Admitting: Family Medicine

## 2012-02-14 VITALS — BP 156/80 | HR 84 | Ht 66.0 in | Wt 189.0 lb

## 2012-02-14 DIAGNOSIS — M159 Polyosteoarthritis, unspecified: Secondary | ICD-10-CM | POA: Diagnosis not present

## 2012-02-14 DIAGNOSIS — K219 Gastro-esophageal reflux disease without esophagitis: Secondary | ICD-10-CM | POA: Diagnosis not present

## 2012-02-14 DIAGNOSIS — M654 Radial styloid tenosynovitis [de Quervain]: Secondary | ICD-10-CM

## 2012-02-14 MED ORDER — DULOXETINE HCL 30 MG PO CPEP: ORAL_CAPSULE | ORAL | Status: DC

## 2012-02-14 MED ORDER — GABAPENTIN 300 MG PO CAPS: ORAL_CAPSULE | ORAL | Status: DC

## 2012-02-14 MED ORDER — OMEPRAZOLE 40 MG PO CPDR: 40.0000 mg | DELAYED_RELEASE_CAPSULE | Freq: Every day | ORAL | Status: DC

## 2012-02-14 MED ORDER — KETOPROFEN POWD: Status: DC

## 2012-02-14 NOTE — Progress Notes (Signed)
  Subjective:    Patient ID: [name], female    DOB: [birth year], 52 y.o.   MRN: [id number]  HPI  1. Back pain/joint pain:  Has had low back pain since beginning of summer that has persisted.  Pain gets better and then worsens.  Also with pain in joints of fingers and feet.  Xrays have been negative in the past.  Currently using aspercream and meloxicam which helps some. Tried cymbalta in the past which worked well for her however could not afford last year.  Tramadol is too constipating for her.  She denies bowel or bladder dysfunction or weakness into her legs.    2. GERD:  Reflux not well controlled with ranitidine now.  Gets symptoms when laying flat at night or after large meals.  She denies dark colored stool, blood in stool, nausea, or abdominal pain.   Review of Systems Per HPI    Objective:   Physical Exam  Constitutional: She appears well-nourished. No distress.  HENT:  Head: Normocephalic and atraumatic.  Abdominal: Soft. Bowel sounds are normal. She exhibits no distension. There is no tenderness.  Musculoskeletal: She exhibits tenderness (Tenderness along entire hand and feet without swelling.  Some tenderness along lower back.). She exhibits no edema.       SLR negative.  FABER negative.            Assessment & Plan:

## 2012-02-14 NOTE — Patient Instructions (Addendum)
Thank you for coming in today, it was good to see you Start the gabapentin, it may make you drowsy at first Start omeprazole for reflux Follow up with me in one month

## 2012-02-14 NOTE — Assessment & Plan Note (Addendum)
Negative x-rays in the past for arthritis,  No swelling seen today on exam.  Sed rated in 2010 was 13.    She can continue meloxicam, will add on gabapentin as well.  Instructed not to use amitriptyline for now as gabapentin may be helpful for sleep.  Will also change zoloft to cymbalta as this has worked well for her.  Follow up in one month.

## 2012-02-14 NOTE — Assessment & Plan Note (Signed)
Change ranitidine to omeprazole.

## 2012-02-18 ENCOUNTER — Other Ambulatory Visit: Payer: Self-pay | Admitting: Family Medicine

## 2012-03-13 ENCOUNTER — Other Ambulatory Visit: Payer: Self-pay | Admitting: Family Medicine

## 2012-04-10 ENCOUNTER — Telehealth: Payer: Self-pay | Admitting: Family Medicine

## 2012-04-10 NOTE — Telephone Encounter (Signed)
Returned call to patient, she is requesting her Cymbalta to be increased. I told her she would need to follow up with Dr Ashley Royalty as instructed at last office visit. Appointment has been scheduled on April 18, 2012. Patient is still wanting to know if the med can be increased before the appt. I told patient I would discuss with her Doctor and call her back.Jameelah Watts, Rodena Medin  Spoke with Dr Jerolyn Center, confirmed that patient is taking 60 mg and to continue that dose until her appointment next week.Monroe Qin, Rodena Medin

## 2012-04-10 NOTE — Telephone Encounter (Signed)
Pt is asking to have her Cymbalta increased because she is still hurting a lot and very emotional

## 2012-04-18 ENCOUNTER — Ambulatory Visit (INDEPENDENT_AMBULATORY_CARE_PROVIDER_SITE_OTHER): Payer: Medicare Other | Admitting: Family Medicine

## 2012-04-18 ENCOUNTER — Encounter: Payer: Self-pay | Admitting: Family Medicine

## 2012-04-18 VITALS — BP 166/83 | HR 77 | Ht 66.0 in | Wt 195.9 lb

## 2012-04-18 DIAGNOSIS — F339 Major depressive disorder, recurrent, unspecified: Secondary | ICD-10-CM | POA: Diagnosis not present

## 2012-04-18 DIAGNOSIS — M159 Polyosteoarthritis, unspecified: Secondary | ICD-10-CM | POA: Diagnosis not present

## 2012-04-18 DIAGNOSIS — I10 Essential (primary) hypertension: Secondary | ICD-10-CM | POA: Diagnosis not present

## 2012-04-18 MED ORDER — CYCLOBENZAPRINE HCL 10 MG PO TABS: 10.0000 mg | ORAL_TABLET | Freq: Every evening | ORAL | Status: DC | PRN

## 2012-04-18 MED ORDER — DULOXETINE HCL 60 MG PO CPEP: 120.0000 mg | ORAL_CAPSULE | Freq: Every day | ORAL | Status: DC

## 2012-04-18 NOTE — Assessment & Plan Note (Signed)
Pain not well controlled. Some hypertonicity of trapezius, spasm may be contributing to her chronic pain.  Will add flexeril at night and see how she responds to that.  Also increase cymbalta.  F/u 2 weeks.

## 2012-04-18 NOTE — Assessment & Plan Note (Signed)
BP elevated, f/u in two weeks.  If remains elevated at next appointment will add additional antihypertensive.

## 2012-04-18 NOTE — Patient Instructions (Addendum)
Thank you for coming in today, it was good to see you I have increased your cymbalta to 120mg  daily I have sent in flexeril as well for you to use at night Follow up with me in one month

## 2012-04-18 NOTE — Assessment & Plan Note (Signed)
Still with significant symptoms of depression and pain not well controlled, increase cymbalta and follow up in 2 weeks

## 2012-04-18 NOTE — Progress Notes (Signed)
  Subjective:    Patient ID: [name], female    DOB: 1960/01/29, 52 y.o.   MRN: [id number]  HPI  1. MDD:  Has been using cymbalta 60mg .  Feels like she needs this to be increased further. Has tolerated dose at 60mg  well. Reports daily pain and that she wants to lay in bed "all the time" .  Was previously on zoloft which worked fairly well for her depression but changed to cymbalta to help with pain control.  She has tried cymbalta in the past as well and it has worked for her but had to quit 2/2 to cost.  She denies any thoughts of SI or HI.  2. Chronic pain:  Reports continued arthralgias and myalgias.  Ketoprofen gel has worked for her wrist pain and she calls it "magic".  Cymbalta has not seemed to help pain much.  SHe is using gabapentin and meloxicam as well.  Rheumatological workup has been negative.   3. HTN:  BP elevated.  She has been using HCTZ as directed.  Thinks that her pain may be contributing to her elevated pressure.  Denies chest pain, headache, vision changes, palpitations.  Review of Systems Per HPI    Objective:   Physical Exam  Constitutional: She appears well-nourished. No distress.  HENT:  Head: Normocephalic and atraumatic.  Cardiovascular: Normal rate and regular rhythm.   Pulmonary/Chest: Effort normal and breath sounds normal.  Musculoskeletal: She exhibits no edema.  Tenderness and hypertonicity of upper trapezius bilaterally.  Tenderness to palpation along wrists but no swelling appreciated.   Neurological: She is alert.  Psychiatric: She has a normal mood and affect. Her behavior is normal. Thought content normal.  Smiles appropriately, interactive           Assessment & Plan:

## 2012-04-19 ENCOUNTER — Other Ambulatory Visit: Payer: Self-pay | Admitting: Family Medicine

## 2012-05-10 ENCOUNTER — Other Ambulatory Visit: Payer: Self-pay | Admitting: Family Medicine

## 2012-05-16 ENCOUNTER — Other Ambulatory Visit: Payer: Self-pay | Admitting: Family Medicine

## 2012-06-07 ENCOUNTER — Other Ambulatory Visit: Payer: Self-pay | Admitting: Family Medicine

## 2012-06-12 ENCOUNTER — Emergency Department (HOSPITAL_COMMUNITY)
Admission: EM | Admit: 2012-06-12 | Discharge: 2012-06-12 | Disposition: A | Discharge status: Home / Self Care | Payer: Medicare Other | Attending: Emergency Medicine | Admitting: Emergency Medicine

## 2012-06-12 ENCOUNTER — Encounter (HOSPITAL_COMMUNITY): Payer: Self-pay

## 2012-06-12 DIAGNOSIS — N949 Unspecified condition associated with female genital organs and menstrual cycle: Secondary | ICD-10-CM | POA: Diagnosis not present

## 2012-06-12 DIAGNOSIS — Z87891 Personal history of nicotine dependence: Secondary | ICD-10-CM | POA: Diagnosis not present

## 2012-06-12 DIAGNOSIS — M159 Polyosteoarthritis, unspecified: Secondary | ICD-10-CM | POA: Insufficient documentation

## 2012-06-12 DIAGNOSIS — K219 Gastro-esophageal reflux disease without esophagitis: Secondary | ICD-10-CM | POA: Insufficient documentation

## 2012-06-12 DIAGNOSIS — R102 Pelvic and perineal pain: Secondary | ICD-10-CM

## 2012-06-12 DIAGNOSIS — Z79899 Other long term (current) drug therapy: Secondary | ICD-10-CM | POA: Diagnosis not present

## 2012-06-12 DIAGNOSIS — Z85118 Personal history of other malignant neoplasm of bronchus and lung: Secondary | ICD-10-CM | POA: Insufficient documentation

## 2012-06-12 DIAGNOSIS — Z90710 Acquired absence of both cervix and uterus: Secondary | ICD-10-CM | POA: Insufficient documentation

## 2012-06-12 DIAGNOSIS — F329 Major depressive disorder, single episode, unspecified: Secondary | ICD-10-CM | POA: Insufficient documentation

## 2012-06-12 DIAGNOSIS — Z791 Long term (current) use of non-steroidal anti-inflammatories (NSAID): Secondary | ICD-10-CM | POA: Diagnosis not present

## 2012-06-12 DIAGNOSIS — Z8639 Personal history of other endocrine, nutritional and metabolic disease: Secondary | ICD-10-CM | POA: Insufficient documentation

## 2012-06-12 DIAGNOSIS — I10 Essential (primary) hypertension: Secondary | ICD-10-CM | POA: Insufficient documentation

## 2012-06-12 DIAGNOSIS — Z862 Personal history of diseases of the blood and blood-forming organs and certain disorders involving the immune mechanism: Secondary | ICD-10-CM | POA: Diagnosis not present

## 2012-06-12 DIAGNOSIS — F3289 Other specified depressive episodes: Secondary | ICD-10-CM | POA: Insufficient documentation

## 2012-06-12 MED ORDER — DOXYCYCLINE HYCLATE 100 MG PO CAPS: 100.0000 mg | ORAL_CAPSULE | Freq: Two times a day (BID) | ORAL | Status: DC

## 2012-06-12 MED ORDER — TRAMADOL HCL 50 MG PO TABS: 50.0000 mg | ORAL_TABLET | Freq: Four times a day (QID) | ORAL | Status: DC | PRN

## 2012-06-12 NOTE — ED Notes (Signed)
Pt reports waking this a.m with pain to her vaginal area.  Pt reports going to bathroom to urinate and felt a "knot" in her vaginal area.  Pt denies any bleeding but does reports constant pain to the area.

## 2012-06-12 NOTE — ED Provider Notes (Addendum)
History    This chart was scribed for Shawna Lennert, MD by [name], ED Scribe. This patient was seen in room APA01/[facility]01 and the patient's care was started 9:49 AM.   CSN: [id number]  Arrival date & time 06/12/12  0847   First MD Initiated Contact with Patient 06/12/12 [phone]      Chief Complaint  Patient presents with  . Vaginal Prolapse     Patient is a 52 y.o. female presenting with groin pain. The history is provided by the patient. No language interpreter was used.  Groin Pain This is a new problem. The current episode started 1 to 2 hours ago. The problem occurs constantly. The problem has not changed since onset.Pertinent negatives include no chest pain, no abdominal pain and no headaches. Nothing aggravates the symptoms. Nothing relieves the symptoms.    HPI Comments: [name] is a 52 y.o. female who presents to the Emergency Department complaining of new, constant, unchanged vaginal pain starting today. Pt reports waking this morning with vaginal pain and while urinating she felt a "knot" in her vaginal area. She denies any vaginal bleeding or discharge. Pt does not see an OB-GYN.    Past Medical History  Diagnosis Date  . HTN (hypertension)   . HLD (hyperlipidemia)   . Osteoarthrosis involving more than one site but not generalized   . Depression   . GERD (gastroesophageal reflux disease)   . Cancer     lung    Past Surgical History  Procedure Laterality Date  . Tubal ligation    . Abdominal hysterectomy    . Abdominal surgery    . Lung cancer surgery    . Bladder surgery      x 4  . Lobectomy      No family history on file.  History  Substance Use Topics  . Smoking status: Former Smoker    Quit date: 04/07/1995  . Smokeless tobacco: Not on file  . Alcohol Use: No    OB History   Grav Para Term Preterm Abortions TAB SAB Ect Mult Living                  Review of Systems  Constitutional: Negative for appetite change and fatigue.  HENT:  Negative for congestion, sinus pressure and ear discharge.   Eyes: Negative for discharge.  Respiratory: Negative for cough.   Cardiovascular: Negative for chest pain.  Gastrointestinal: Negative for abdominal pain and diarrhea.  Genitourinary: Positive for vaginal pain. Negative for frequency, hematuria, vaginal bleeding and vaginal discharge.  Musculoskeletal: Negative for back pain.  Skin: Negative for rash.  Neurological: Negative for seizures and headaches.  Psychiatric/Behavioral: Negative for hallucinations.    Allergies  Promethazine hcl  Home Medications   Current Outpatient Rx  Name  Route  Sig  Dispense  Refill  . albuterol (PROAIR HFA) 108 (90 BASE) MCG/ACT inhaler   Inhalation   Inhale 2 puffs into the lungs every 4 (four) hours as needed. for shortness of breath         . amitriptyline (ELAVIL) 50 MG tablet   Oral   Take 50 mg by mouth at bedtime.         . Calcium Citrate-Vitamin D 250-200 MG-UNIT TABS   Oral   Take 1 tablet by mouth daily.          . cyclobenzaprine (FLEXERIL) 10 MG tablet   Oral   Take 1 tablet (10 mg total) by mouth at bedtime  as needed for muscle spasms.   30 tablet   3   . DULoxetine (CYMBALTA) 60 MG capsule   Oral   Take 2 capsules (120 mg total) by mouth daily.   60 capsule   3   . gabapentin (NEURONTIN) 300 MG capsule      Take one tab qhs x1 week, then increase to bid x1 week, then tid   90 capsule   3   . hydrochlorothiazide (HYDRODIURIL) 25 MG tablet   Oral   Take 25 mg by mouth daily.         . Ketoprofen POWD      Please compound into a 20% gel   30 g   2   . meloxicam (MOBIC) 15 MG tablet   Oral   Take 15 mg by mouth daily.         Marland Kitchen omeprazole (PRILOSEC) 40 MG capsule   Oral   Take 40 mg by mouth daily.           BP 136/63  Pulse 85  Temp(Src) 98.8 F (37.1 C) (Oral)  Resp 15  SpO2 95%  Physical Exam  Constitutional: She is oriented to person, place, and time. She appears  well-developed.  HENT:  Head: Normocephalic.  Eyes: Conjunctivae are normal.  Neck: No tracheal deviation present.  Cardiovascular:  No murmur heard. Genitourinary:  0.5 cm diameter area of swelling and tenderness to the inside of vagina on L.   Musculoskeletal: Normal range of motion.  Neurological: She is oriented to person, place, and time.  Skin: Skin is warm.  Psychiatric: She has a normal mood and affect.    ED Course  Procedures (including critical care time)  DIAGNOSTIC STUDIES: Oxygen Saturation is 95% on room air, adequate by my interpretation.    COORDINATION OF CARE: 9:58 AM Discussed treatment plan with pt at bedside and pt agreed to plan.   Labs Reviewed - No data to display No results found.   No diagnosis found.    MDM       The chart was scribed for me under my direct supervision.  I personally performed the history, physical, and medical decision making and all procedures in the evaluation of this patient.Shawna Lennert, MD 06/12/12 1009  Shawna Lennert, MD 06/12/12 1013

## 2012-06-13 ENCOUNTER — Telehealth: Payer: Self-pay | Admitting: *Deleted

## 2012-06-13 NOTE — Telephone Encounter (Signed)
Received prior authorization form from [facility] for cyclobenzaporine.  Form placed in doctor's box for completion, please place in fax box when complete. Wyatt Haste, RN-BSN

## 2012-06-14 ENCOUNTER — Other Ambulatory Visit: Payer: Self-pay | Admitting: Family Medicine

## 2012-06-14 ENCOUNTER — Other Ambulatory Visit: Payer: Self-pay | Admitting: *Deleted

## 2012-06-14 NOTE — Telephone Encounter (Signed)
Patient needs follow up, instructed to follow up two weeks after her appt in April and has not yet.

## 2012-06-14 NOTE — Telephone Encounter (Signed)
[facility] called and informed that pt would need follow up appointment prior to refill for Flexeril is authorized per Dr. Ashley Royalty. Wyatt Haste, RN-BSN

## 2012-06-16 ENCOUNTER — Encounter: Payer: Self-pay | Admitting: Obstetrics and Gynecology

## 2012-06-16 ENCOUNTER — Encounter (HOSPITAL_COMMUNITY): Payer: Self-pay

## 2012-06-16 ENCOUNTER — Encounter (HOSPITAL_COMMUNITY)
Admission: RE | Admit: 2012-06-16 | Discharge: 2012-06-16 | Disposition: A | Discharge status: Home / Self Care | Payer: Medicare Other | Source: Ambulatory Visit | Attending: Obstetrics and Gynecology | Admitting: Obstetrics and Gynecology

## 2012-06-16 ENCOUNTER — Ambulatory Visit (INDEPENDENT_AMBULATORY_CARE_PROVIDER_SITE_OTHER): Payer: Medicare Other | Admitting: Obstetrics and Gynecology

## 2012-06-16 VITALS — BP 132/90 | Ht 66.0 in | Wt 190.0 lb

## 2012-06-16 DIAGNOSIS — L723 Sebaceous cyst: Secondary | ICD-10-CM | POA: Diagnosis not present

## 2012-06-16 DIAGNOSIS — Z01810 Encounter for preprocedural cardiovascular examination: Secondary | ICD-10-CM | POA: Diagnosis not present

## 2012-06-16 DIAGNOSIS — N9089 Other specified noninflammatory disorders of vulva and perineum: Secondary | ICD-10-CM | POA: Diagnosis not present

## 2012-06-16 DIAGNOSIS — N907 Vulvar cyst: Secondary | ICD-10-CM

## 2012-06-16 DIAGNOSIS — I10 Essential (primary) hypertension: Secondary | ICD-10-CM | POA: Diagnosis not present

## 2012-06-16 HISTORY — DX: Unspecified asthma, uncomplicated: J45.909

## 2012-06-16 LAB — BASIC METABOLIC PANEL
CO2: 32 mEq/L (ref 19–32)
Chloride: 100 mEq/L (ref 96–112)
Glucose, Bld: 103 mg/dL — ABNORMAL HIGH (ref 70–99)
Potassium: 3.9 mEq/L (ref 3.5–5.1)
Sodium: 143 mEq/L (ref 135–145)

## 2012-06-16 LAB — CBC
HCT: 39.1 % (ref 36.0–46.0)
Hemoglobin: 13.8 g/dL (ref 12.0–15.0)
MCV: 89.9 fL (ref 78.0–100.0)
Platelets: 255 10*3/uL (ref 150–400)
RBC: 4.35 MIL/uL (ref 3.87–5.11)
WBC: 6.2 10*3/uL (ref 4.0–10.5)

## 2012-06-16 NOTE — Progress Notes (Signed)
Patient ID: [name], female   DOB: 04-08-1960, 52 y.o.   MRN: [id number] Pt here today for follow up from ER. Pt has cyst on left side of vagina. Pt is currently taking Antibiotics from ER, Doxycycline.Having a lot of pain. Pt states she thinks it has gotten worse since she was seen at the ER.   Assessment:  sebaceous cyst, left labia minora, subclitoral   Plan:  To OR for excision due to locationWill schedule for early next week.  Subjective:  [name] is a 52 y.o. female, No obstetric history on file., who presents for ER f/u of cyst , painful x 10 d. No significant improvement with abx. .  The following portions of the patient's history were reviewed and updated as appropriate: allergies, current medications, past medical & surgical history, & past family history.       Review of Systems Pertinent items are noted in HPI. Breast:Negative for breast lump,nipple discharge or nipple retraction Gastrointestinal: Negative for abdominal pain, change in bowel habits or rectal bleeding GU: Negative for dysuria, frequency, urgency or incontinence.   GYN: No LMP recorded. Patient has had a hysterectomy. personal hx of lung cancer  Objective:  BP 132/90  Ht 5\' 6"  (1.676 m)  Wt 190 lb (86.183 kg)  BMI 30.68 kg/m2    BMI: Body mass index is 30.68 kg/(m^2).  General Appearance: Alert, appropriate appearance for age. No acute distress HEENT: Grossly normal Neck / Thyroid: Supple, no masses, nodes or enlargement Cardiovascular: Regular rate and rhythm. S1, S2, no murmur Lungs: Clear to auscultation bilaterally Back: No CVA tenderness Gastrointestinal: Soft, non-tender, no masses or organomegaly Pelvic Exam: External genitalia: normal general appearance and swelliing on left just below clitoris, with firm, hard non-erythematous sebaceous cyst, affects urethral orifice. Vaginal: normal mucosa without prolapse or lesions and speculum chk deferred due to pain Rectovaginal: not  indicated  Christin Bach MD

## 2012-06-16 NOTE — Patient Instructions (Addendum)
[name]  06/16/2012   Your procedure is scheduled on:  06/20/2012  Report to [facility] at  810  AM.  Call this number if you have problems the morning of surgery: [phone]   Remember:   Do not eat food or drink liquids after midnight.   Take these medicines the morning of surgery with A SIP OF WATER: elavil,flexaril,cymbalta,neurontin,hydrodiuril,mobic, prilosec,ultram. Take your proair before you come.   Do not wear jewelry, make-up or nail polish.  Do not wear lotions, powders, or perfumes.   Do not shave 48 hours prior to surgery. Men may shave face and neck.  Do not bring valuables to the hospital.  [facility] is not responsible for any belongings or valuables.  Contacts, dentures or bridgework may not be worn into surgery.  Leave suitcase in the car. After surgery it may be brought to your room.  For patients admitted to the hospital, checkout time is 11:00 AM the day of discharge.   Patients discharged the day of surgery will not be allowed to drive  home.  Name and phone number of your driver: family  Special Instructions: Shower using CHG 2 nights before surgery and the night before surgery.  If you shower the day of surgery use CHG.  Use special wash - you have one bottle of CHG for all showers.  You should use approximately 1/3 of the bottle for each shower.   Please read over the following fact sheets that you were given: Pain Booklet, Coughing and Deep Breathing, MRSA Information, Surgical Site Infection Prevention, Anesthesia Post-op Instructions and Care and Recovery After Surgery Cyst Removal Your caregiver has removed a cyst. A cyst is a sac containing a semi-solid material. Cysts may occur any place on your body. They may remain small for years or gradually get larger. A sebaceous cyst is an enlarged (dilated) sweat gland filled with old sweat (sebum). Unattended, these may become large (the size of a softball) over several years time. These are often removed  for improved appearance (cosmetic) reasons or before they become infected to form an abscess. An abscess is an infected cyst. HOME CARE INSTRUCTIONS   Keep your bandage clean and dry. You may change your bandage after 24 hours. If your bandage sticks, use warm water to gently loosen it. Pat the area dry with a clean towel before putting on another bandage.  If possible, keep the area where the cyst was removed raised to relieve soreness, swelling, and promote healing.  If you have stitches, keep them clean and dry.  You may clean your stitches gently with a cotton swab dipped in warm soapy water.  Do not soak the area where the cyst was removed or go swimming. You may shower.  Do not overuse the area where your cyst was removed.  Return in 7 days or as directed to have your stitches removed.  Take medicines as instructed by your caregiver. SEEK IMMEDIATE MEDICAL CARE IF:   An oral temperature above 102 F (38.9 C) develops, not controlled by medication.  Blood continues to soak through the bandage.  You have increasing pain in the area where your cyst was removed.  You have redness, swelling, pus, a bad smell, soreness (inflammation), or red streaks coming away from the stitches. These are signs of infection. MAKE SURE YOU:   Understand these instructions.  Will watch your condition.  Will get help right away if you are not doing well or get worse.  Document Released: 01/02/2000 Document Revised: 03/29/2011 Document Reviewed: 04/27/2007 [facility] Patient Information 2014 [place], [place]. PATIENT INSTRUCTIONS POST-ANESTHESIA  IMMEDIATELY FOLLOWING SURGERY:  Do not drive or operate machinery for the first twenty four hours after surgery.  Do not make any important decisions for twenty four hours after surgery or while taking narcotic pain medications or sedatives.  If you develop intractable nausea and vomiting or a severe headache please notify your doctor  immediately.  FOLLOW-UP:  Please make an appointment with your surgeon as instructed. You do not need to follow up with anesthesia unless specifically instructed to do so.  WOUND CARE INSTRUCTIONS (if applicable):  Keep a dry clean dressing on the anesthesia/puncture wound site if there is drainage.  Once the wound has quit draining you may leave it open to air.  Generally you should leave the bandage intact for twenty four hours unless there is drainage.  If the epidural site drains for more than 36-48 hours please call the anesthesia department.  QUESTIONS?:  Please feel free to call your physician or the hospital operator if you have any questions, and they will be happy to assist you.

## 2012-06-16 NOTE — Patient Instructions (Signed)
To arrange surgery mon or tuesday

## 2012-06-20 ENCOUNTER — Encounter (HOSPITAL_COMMUNITY): Payer: Self-pay | Admitting: *Deleted

## 2012-06-20 ENCOUNTER — Ambulatory Visit (HOSPITAL_COMMUNITY): Payer: Medicare Other | Admitting: Anesthesiology

## 2012-06-20 ENCOUNTER — Encounter (HOSPITAL_COMMUNITY): Payer: Self-pay | Admitting: Anesthesiology

## 2012-06-20 ENCOUNTER — Ambulatory Visit (HOSPITAL_COMMUNITY)
Admission: RE | Admit: 2012-06-20 | Discharge: 2012-06-20 | Disposition: A | Discharge status: Home / Self Care | Payer: Medicare Other | Source: Ambulatory Visit | Attending: Obstetrics and Gynecology | Admitting: Obstetrics and Gynecology

## 2012-06-20 ENCOUNTER — Ambulatory Visit: Admit: 2012-06-20 | Payer: Self-pay | Admitting: Obstetrics and Gynecology

## 2012-06-20 ENCOUNTER — Encounter (HOSPITAL_COMMUNITY)
Admission: RE | Disposition: A | Discharge status: Home / Self Care | Payer: Self-pay | Source: Ambulatory Visit | Attending: Obstetrics and Gynecology

## 2012-06-20 DIAGNOSIS — Z01810 Encounter for preprocedural cardiovascular examination: Secondary | ICD-10-CM | POA: Diagnosis not present

## 2012-06-20 DIAGNOSIS — I10 Essential (primary) hypertension: Secondary | ICD-10-CM | POA: Diagnosis not present

## 2012-06-20 DIAGNOSIS — N9089 Other specified noninflammatory disorders of vulva and perineum: Secondary | ICD-10-CM | POA: Diagnosis not present

## 2012-06-20 DIAGNOSIS — Q516 Embryonic cyst of cervix: Secondary | ICD-10-CM | POA: Diagnosis not present

## 2012-06-20 DIAGNOSIS — L723 Sebaceous cyst: Secondary | ICD-10-CM | POA: Diagnosis not present

## 2012-06-20 DIAGNOSIS — N907 Vulvar cyst: Secondary | ICD-10-CM

## 2012-06-20 HISTORY — PX: EXCISION VAGINAL CYST: SHX5825

## 2012-06-20 SURGERY — EXCISION, CYST, VAGINA
Anesthesia: General

## 2012-06-20 SURGERY — EXCISION, CYST, VAGINA
Anesthesia: General | Site: Perineum | Laterality: Left | Wound class: Clean

## 2012-06-20 MED ORDER — MIDAZOLAM HCL 2 MG/2ML IJ SOLN
INTRAMUSCULAR | Status: AC
  Filled 2012-06-20: qty 2

## 2012-06-20 MED ORDER — LACTATED RINGERS IV SOLN
INTRAVENOUS | Status: DC
  Administered 2012-06-20: 07:00:00 via INTRAVENOUS

## 2012-06-20 MED ORDER — HYDROCODONE-ACETAMINOPHEN 5-325 MG PO TABS: 1.0000 | ORAL_TABLET | Freq: Four times a day (QID) | ORAL | Status: DC | PRN

## 2012-06-20 MED ORDER — FENTANYL CITRATE 0.05 MG/ML IJ SOLN: 25.0000 ug | INTRAMUSCULAR | Status: DC | PRN

## 2012-06-20 MED ORDER — FENTANYL CITRATE 0.05 MG/ML IJ SOLN
INTRAMUSCULAR | Status: AC
  Filled 2012-06-20: qty 5

## 2012-06-20 MED ORDER — SODIUM CHLORIDE 0.9 % IV SOLN: INTRAVENOUS | Status: DC

## 2012-06-20 MED ORDER — 0.9 % SODIUM CHLORIDE (POUR BTL) OPTIME
TOPICAL | Status: DC | PRN
  Administered 2012-06-20: 1000 mL

## 2012-06-20 MED ORDER — BUPIVACAINE-EPINEPHRINE PF 0.5-1:200000 % IJ SOLN
INTRAMUSCULAR | Status: AC
  Filled 2012-06-20: qty 10

## 2012-06-20 MED ORDER — FENTANYL CITRATE 0.05 MG/ML IJ SOLN
INTRAMUSCULAR | Status: DC | PRN
  Administered 2012-06-20: 25 ug via INTRAVENOUS
  Administered 2012-06-20: 50 ug via INTRAVENOUS
  Administered 2012-06-20: 25 ug via INTRAVENOUS

## 2012-06-20 MED ORDER — MIDAZOLAM HCL 2 MG/2ML IJ SOLN
1.0000 mg | INTRAMUSCULAR | Status: DC | PRN
  Administered 2012-06-20: 2 mg via INTRAVENOUS

## 2012-06-20 MED ORDER — BUPIVACAINE-EPINEPHRINE 0.5% -1:200000 IJ SOLN
INTRAMUSCULAR | Status: DC | PRN
  Administered 2012-06-20: 9 mL

## 2012-06-20 MED ORDER — LIDOCAINE HCL (CARDIAC) 20 MG/ML IV SOLN
INTRAVENOUS | Status: DC | PRN
  Administered 2012-06-20: 30 mg via INTRAVENOUS

## 2012-06-20 MED ORDER — PROPOFOL 10 MG/ML IV BOLUS
INTRAVENOUS | Status: DC | PRN
  Administered 2012-06-20: 50 mg via INTRAVENOUS
  Administered 2012-06-20: 150 mg via INTRAVENOUS

## 2012-06-20 MED ORDER — PROPOFOL 10 MG/ML IV EMUL
INTRAVENOUS | Status: AC
  Filled 2012-06-20: qty 20

## 2012-06-20 MED ORDER — ONDANSETRON HCL 4 MG/2ML IJ SOLN
INTRAMUSCULAR | Status: AC
  Filled 2012-06-20: qty 2

## 2012-06-20 MED ORDER — CEFAZOLIN SODIUM-DEXTROSE 2-3 GM-% IV SOLR
2.0000 g | INTRAVENOUS | Status: AC
  Administered 2012-06-20: 2 g via INTRAVENOUS

## 2012-06-20 MED ORDER — ONDANSETRON HCL 4 MG/2ML IJ SOLN
4.0000 mg | Freq: Once | INTRAMUSCULAR | Status: AC
  Administered 2012-06-20: 4 mg via INTRAVENOUS

## 2012-06-20 MED ORDER — LIDOCAINE HCL (PF) 1 % IJ SOLN
INTRAMUSCULAR | Status: AC
  Filled 2012-06-20: qty 5

## 2012-06-20 MED ORDER — CEFAZOLIN SODIUM-DEXTROSE 2-3 GM-% IV SOLR
INTRAVENOUS | Status: AC
  Filled 2012-06-20: qty 50

## 2012-06-20 MED ORDER — ONDANSETRON HCL 4 MG/2ML IJ SOLN: 4.0000 mg | Freq: Once | INTRAMUSCULAR | Status: DC | PRN

## 2012-06-20 SURGICAL SUPPLY — 36 items
BAG HAMPER (MISCELLANEOUS) ×2 IMPLANT
CATH FOLEY 2WAY SLVR  5CC 16FR (CATHETERS) ×1
CATH FOLEY 2WAY SLVR 5CC 16FR (CATHETERS) IMPLANT
CLOTH BEACON ORANGE TIMEOUT ST (SAFETY) ×2 IMPLANT
COVER LIGHT HANDLE STERIS (MISCELLANEOUS) ×4 IMPLANT
DECANTER SPIKE VIAL GLASS SM (MISCELLANEOUS) ×2 IMPLANT
DRAPE PROXIMA HALF (DRAPES) ×2 IMPLANT
DRAPE STERI URO 9X17 APER PCH (DRAPES) ×2 IMPLANT
ELECT REM PT RETURN 9FT ADLT (ELECTROSURGICAL) ×2
ELECTRODE REM PT RTRN 9FT ADLT (ELECTROSURGICAL) ×1 IMPLANT
FORMALIN 10 PREFIL 120ML (MISCELLANEOUS) ×1 IMPLANT
GAUZE PACKING 2X5 YD STERILE (GAUZE/BANDAGES/DRESSINGS) ×1 IMPLANT
GLOVE BIOGEL PI IND STRL 7.0 (GLOVE) IMPLANT
GLOVE BIOGEL PI INDICATOR 7.0 (GLOVE) ×2
GLOVE ECLIPSE 9.0 STRL (GLOVE) ×2 IMPLANT
GLOVE EXAM NITRILE LRG STRL (GLOVE) ×1 IMPLANT
GLOVE INDICATOR STER SZ 9 (GLOVE) ×2 IMPLANT
GLOVE SS BIOGEL STRL SZ 6.5 (GLOVE) IMPLANT
GLOVE SUPERSENSE BIOGEL SZ 6.5 (GLOVE) ×1
GOWN STRL REIN 3XL LVL4 (GOWN DISPOSABLE) ×2 IMPLANT
GOWN STRL REIN XL XLG (GOWN DISPOSABLE) ×3 IMPLANT
KIT ROOM TURNOVER APOR (KITS) ×2 IMPLANT
MANIFOLD NEPTUNE II (INSTRUMENTS) ×2 IMPLANT
NDL HYPO 25X1 1.5 SAFETY (NEEDLE) IMPLANT
NEEDLE HYPO 25X1 1.5 SAFETY (NEEDLE) ×2 IMPLANT
NS IRRIG 1000ML POUR BTL (IV SOLUTION) ×2 IMPLANT
PACK PERI GYN (CUSTOM PROCEDURE TRAY) ×2 IMPLANT
PAD ARMBOARD 7.5X6 YLW CONV (MISCELLANEOUS) ×2 IMPLANT
SET BASIN LINEN APH (SET/KITS/TRAYS/PACK) ×2 IMPLANT
SET IV ADMIN VERSALIGHT (MISCELLANEOUS) IMPLANT
SUT CHROMIC 2 0 CT 1 (SUTURE) ×3 IMPLANT
SUT PROLENE 2 0 FS (SUTURE) IMPLANT
SUT VIC AB 0 CT2 8-18 (SUTURE) IMPLANT
SUT VIC AB 4-0 PS2 27 (SUTURE) ×1 IMPLANT
SYR CONTROL 10ML LL (SYRINGE) ×1 IMPLANT
SYRINGE 10CC LL (SYRINGE) ×1 IMPLANT

## 2012-06-20 NOTE — H&P (Signed)
Subjective:   [name] is a 52 y.o. female, No obstetric history on file., who presents for ER f/u of cyst , painful x 10 d. No significant improvement with abx.  .  The following portions of the patient's history were reviewed and updated as appropriate: allergies, current medications, past medical & surgical history, & past family history.  Review of Systems  Pertinent items are noted in HPI.  Breast:Negative for breast lump,nipple discharge or nipple retraction  Gastrointestinal: Negative for abdominal pain, change in bowel habits or rectal bleeding  GU: Negative for dysuria, frequency, urgency or incontinence.  GYN: No LMP recorded. Patient has had a hysterectomy. personal hx of lung cancer  Objective:   BP 132/90  Ht 5\' 6"  (1.676 m)  Wt 190 lb (86.183 kg)  BMI 30.68 kg/m2  BMI: Body mass index is 30.68 kg/(m^2).  General Appearance: Alert, appropriate appearance for age. No acute distress  HEENT: Grossly normal  Neck / Thyroid: Supple, no masses, nodes or enlargement  Cardiovascular: Regular rate and rhythm. S1, S2, no murmur  Lungs: Clear to auscultation bilaterally  Back: No CVA tenderness  Gastrointestinal: Soft, non-tender, no masses or organomegaly  Pelvic Exam: External genitalia: normal general appearance and swelliing on left just below clitoris, with firm, hard non-erythematous sebaceous cyst, affects urethral orifice.  Vaginal: normal mucosa without prolapse or lesions and speculum chk deferred due to pain  Rectovaginal: not indicated   Assessment: Subclitoral Sebaceous cyst, symptomatic Plan:  Excision Sebaceous cyst, Subclitoral, symptomatic. Due to sensitivity of location , will require Outpatient excision. Scheduled for 06/20/2012.

## 2012-06-20 NOTE — Anesthesia Procedure Notes (Signed)
Procedure Name: LMA Insertion Date/Time: 06/20/2012 7:48 AM Performed by: Glynn Octave E Pre-anesthesia Checklist: Patient identified, Patient being monitored, Emergency Drugs available, Timeout performed and Suction available Patient Re-evaluated:Patient Re-evaluated prior to inductionOxygen Delivery Method: Circle System Utilized Preoxygenation: Pre-oxygenation with 100% oxygen Intubation Type: IV induction Ventilation: Mask ventilation without difficulty LMA: LMA inserted LMA Size: 3.0 Number of attempts: 1 Placement Confirmation: positive ETCO2 and breath sounds checked- equal and bilateral

## 2012-06-20 NOTE — Anesthesia Preprocedure Evaluation (Signed)
Anesthesia Evaluation  Patient identified by MRN, date of birth, ID band Patient awake    Reviewed: Allergy & Precautions, H&P , NPO status , Patient's Chart, lab work & pertinent test results  History of Anesthesia Complications Negative for: history of anesthetic complications  Airway Mallampati: III TM Distance: >3 FB   Mouth opening: Limited Mouth Opening  Dental  (+) Edentulous Upper and Partial Lower   Pulmonary asthma , sleep apnea and Continuous Positive Airway Pressure Ventilation , former smoker,  L Lower Lobectomy in 1997 for CA breath sounds clear to auscultation        Cardiovascular hypertension, Pt. on medications Rhythm:Regular Rate:Normal     Neuro/Psych    GI/Hepatic GERD- (inactive now)  Controlled and Medicated,  Endo/Other    Renal/GU      Musculoskeletal   Abdominal   Peds  Hematology   Anesthesia Other Findings   Reproductive/Obstetrics                           Anesthesia Physical Anesthesia Plan  ASA: III  Anesthesia Plan: General   Post-op Pain Management:    Induction: Intravenous  Airway Management Planned: LMA  Additional Equipment:   Intra-op Plan:   Post-operative Plan: Extubation in OR  Informed Consent: I have reviewed the patients History and Physical, chart, labs and discussed the procedure including the risks, benefits and alternatives for the proposed anesthesia with the patient or authorized representative who has indicated his/her understanding and acceptance.     Plan Discussed with:   Anesthesia Plan Comments:         Anesthesia Quick Evaluation

## 2012-06-20 NOTE — Op Note (Addendum)
Procedure: Excision of left labia minora cyst, located sub-clitoral 2 CM Surgeon: Emelda Fear Details of procedure:. Patient was taken operating room prepped and draped for procedure. Left side had been marked for identification. Procedure was confirmed during timeout. Ancef administered. Marcaine with epinephrine was injected x4 cc around the cyst and then a L-shaped incision made over the top of the cyst and a spreading technique used to identify the cleavage planes around sebaceous cyst approximately 2/3 of the cyst could be removed from cyst ruptured revealing a watery, bloody slightly purulent fluid, without malodor. the surgical site was irrigated, and the remaining base of the cyst excised. Point cautery was used as necessary for hemostasis. A Foley catheter was in place during this time to ensure that surgery was well away from the urethra. 2 layer closure, subcuticular 4-0 Vicryl was then performed with satisfactory tissue edge approximation. An additional 4 cc of Marcaine was injected around the cyst site and patient went to recovery in stable condition sponge and needle counts correct

## 2012-06-20 NOTE — Brief Op Note (Signed)
06/20/2012  8:45 AM  PATIENT:  [name]  52 y.o. female  PRE-OPERATIVE DIAGNOSIS:  sebaceous cyst left labia minora  POST-OPERATIVE DIAGNOSIS:  sebaceous cyst left labia minora  PROCEDURE:  Procedure(s): EXCISION LEFT LABIAL CYST (Left)  SURGEON:  Surgeon(s) and Role:    * [name], MD - Primary  PHYSICIAN ASSISTANT:   ASSISTANTS: Marya Landry, CST  ANESTHESIA:   local, general and With LMA  EBL:  Total I/O In: 800 [I.V.:800] Out: 25 [Urine:25]  BLOOD ADMINISTERED:none  DRAINS: none   LOCAL MEDICATIONS USED:  MARCAINE    and Amount:  8 mL ml  SPECIMEN:  Source of Specimen:  Sebaceous cyst  DISPOSITION OF SPECIMEN:  PATHOLOGY  COUNTS:  YES  TOURNIQUET:  * No tourniquets in log *  DICTATION: .Dragon Dictation  PLAN OF CARE: Discharge to home after PACU  PATIENT DISPOSITION:  PACU - hemodynamically stable.   Delay start of Pharmacological VTE agent (>24hrs) due to surgical blood loss or risk of bleeding: yes

## 2012-06-20 NOTE — Anesthesia Postprocedure Evaluation (Signed)
  Anesthesia Post-op Note  Patient: [name]  Procedure(s) Performed: Procedure(s): EXCISION LEFT LABIAL CYST (Left)  Patient Location: PACU  Anesthesia Type:General  Level of Consciousness: awake, alert  and oriented  Airway and Oxygen Therapy: Patient Spontanous Breathing and Patient connected to face mask oxygen  Post-op Pain: none  Post-op Assessment: Post-op Vital signs reviewed, Patient's Cardiovascular Status Stable, Respiratory Function Stable, Patent Airway and No signs of Nausea or vomiting  Post-op Vital Signs: Reviewed and stable  Complications: No apparent anesthesia complications

## 2012-06-20 NOTE — Transfer of Care (Signed)
Immediate Anesthesia Transfer of Care Note  Patient: [name]  Procedure(s) Performed: Procedure(s): EXCISION LEFT LABIAL CYST (Left)  Patient Location: PACU  Anesthesia Type:General  Level of Consciousness: awake  Airway & Oxygen Therapy: Patient Spontanous Breathing and Patient connected to face mask oxygen  Post-op Assessment: Report given to PACU RN  Post vital signs: Reviewed and stable  Complications: No apparent anesthesia complications

## 2012-06-23 ENCOUNTER — Encounter (HOSPITAL_COMMUNITY): Payer: Self-pay | Admitting: Obstetrics and Gynecology

## 2012-06-30 ENCOUNTER — Encounter: Payer: Medicare Other | Admitting: Obstetrics and Gynecology

## 2012-07-04 ENCOUNTER — Telehealth: Payer: Self-pay | Admitting: Obstetrics and Gynecology

## 2012-07-04 NOTE — Telephone Encounter (Signed)
Pt states had surgery, excision of left labia on 06/20/2012, c/o pain at incision site, no improvement with pain medications, states "not feeling well, feels achy." Pt states does not think she has a fever. Appointment made with Dr. Emelda Fear for tomorrow at 10:30 am.

## 2012-07-05 ENCOUNTER — Encounter: Payer: Self-pay | Admitting: Obstetrics and Gynecology

## 2012-07-05 ENCOUNTER — Ambulatory Visit (INDEPENDENT_AMBULATORY_CARE_PROVIDER_SITE_OTHER): Payer: Medicare Other | Admitting: Obstetrics and Gynecology

## 2012-07-05 VITALS — BP 150/70 | Ht 66.0 in | Wt 191.2 lb

## 2012-07-05 DIAGNOSIS — Z9889 Other specified postprocedural states: Secondary | ICD-10-CM

## 2012-07-05 DIAGNOSIS — Z5189 Encounter for other specified aftercare: Secondary | ICD-10-CM

## 2012-07-05 MED ORDER — TRIPLE ANTIBIOTIC 5-400-5000 EX OINT: TOPICAL_OINTMENT | Freq: Four times a day (QID) | CUTANEOUS | Status: DC

## 2012-07-05 NOTE — Patient Instructions (Addendum)
Wound careWound Dehiscence Wound dehiscence is when a surgical cut (incision) opens up. It usually happens 7 to 10 days after surgery. You may have pain, a fever, or have more fluid coming from the cut. It should be treated early. HOME CARE  Only take medicines as told by your doctor.  Take your medicines (antibiotics) as told. Finish them even if you start to feel better.  Wash your wound with warm, soapy water 2 times a day, or as told. Pat the wound dry. Do not rub the wound.  Change bandages (dressings) as often as told. Wash your hands before and after changing bandages. Apply bandages as told.  Take showers. Do not soak the wound, bathe, or swim until your wound is healed.  Avoid exercises that make you sweat.  Use medicines that stop itching as told by your doctor. The wound may itch as it heals. Do not pick or scratch at the wound.  Do not lift more than 10 pounds (4.5 kilograms) until the wound is healed, or as told by your doctor.  Keep all doctor visits as told. GET HELP RIGHT AWAY IF:   You have more puffiness (swelling) or redness around the wound.  You have more pain in the wound.  You have yellowish white fluid (pus) coming from the wound.  More of the wound breaks open.  You have a fever. MAKE SURE YOU:   Understand these instructions.  Will watch your condition.  Will get help right away if you are not doing well or get worse. Document Released: 12/23/2008 Document Revised: 03/29/2011 Document Reviewed: 05/10/2010 [facility] Patient Information 2014 [place], [place].   Use a donut to sit on.  Topical neosporin three- 4 times daily.

## 2012-07-05 NOTE — Progress Notes (Signed)
Olen Pel, RN Signed Olen Pel, RN 07/04/2012 3:48 PM         Telephone Encounter    Pt states had surgery, excision of left labia on 06/20/2012, c/o pain at incision site, no improvement with pain medications, states "not feeling well, feels achy."  Pt states does not think she has a fever. Appointment made with Dr. Emelda Fear for tomorrow at 10:30 am.        pt has noted increased discomfort.   O Pex: surface layer of stitches have pulled out, Pt too active, not using a donut to sit on.  A  Partial skin edge separation s/p excisison of sebaceous cyst.  P : topical neosporin,      Donut to sit on      Expect resolution 2 wk.

## 2012-07-07 ENCOUNTER — Encounter: Payer: Medicare Other | Admitting: Obstetrics and Gynecology

## 2012-07-12 ENCOUNTER — Other Ambulatory Visit: Payer: Self-pay | Admitting: Family Medicine

## 2012-07-13 ENCOUNTER — Other Ambulatory Visit: Payer: Self-pay | Admitting: *Deleted

## 2012-07-13 MED ORDER — HYDROCHLOROTHIAZIDE 25 MG PO TABS: 25.0000 mg | ORAL_TABLET | Freq: Every day | ORAL | Status: DC

## 2012-07-13 MED ORDER — MELOXICAM 15 MG PO TABS: ORAL_TABLET | ORAL | Status: DC

## 2012-07-13 NOTE — Telephone Encounter (Signed)
error 

## 2012-07-17 ENCOUNTER — Other Ambulatory Visit: Payer: Self-pay | Admitting: *Deleted

## 2012-07-17 MED ORDER — GABAPENTIN 300 MG PO CAPS: ORAL_CAPSULE | ORAL | Status: DC

## 2012-07-24 ENCOUNTER — Encounter: Payer: Self-pay | Admitting: Obstetrics and Gynecology

## 2012-07-24 ENCOUNTER — Ambulatory Visit (INDEPENDENT_AMBULATORY_CARE_PROVIDER_SITE_OTHER): Payer: Medicare Other | Admitting: Obstetrics and Gynecology

## 2012-07-24 VITALS — BP 144/70 | Ht 66.0 in | Wt 192.2 lb

## 2012-07-24 DIAGNOSIS — Z9889 Other specified postprocedural states: Secondary | ICD-10-CM | POA: Diagnosis not present

## 2012-07-24 DIAGNOSIS — N907 Vulvar cyst: Secondary | ICD-10-CM

## 2012-07-24 DIAGNOSIS — N9489 Other specified conditions associated with female genital organs and menstrual cycle: Secondary | ICD-10-CM

## 2012-07-24 MED ORDER — HYDROCODONE-ACETAMINOPHEN 5-325 MG PO TABS: 1.0000 | ORAL_TABLET | Freq: Four times a day (QID) | ORAL | Status: DC | PRN

## 2012-07-24 NOTE — Progress Notes (Signed)
Patient ID: Judi Cong, female   DOB: [birth year], 52 y.o.   MRN: [id number] Follow up incision/stitches from surgery, Sebaceous cyst left labia. Pt states continues to have pain, rated at 6 on 1-10 scale.   [facility] Visit  Patient name: [name] MRN [id number]  Date of birth: [birth year]  CC & HPI:  [name] is a 52 y.o. female presenting today for recheck incision. Still wants something for pain.   ROS:  No fever , using pads for vag dischg  Pertinent History Reviewed:  Medical & Surgical Hx:  Reviewed: Significant for  Medications: Reviewed & Updated - see associated section Social History: Reviewed -  reports that she quit smoking about 17 years ago. Her smoking use included Cigarettes. She smoked 0.00 packs per day. She has never used smokeless tobacco.  Objective Findings:  Vitals: BP 144/70  Ht 5\' 6"  (1.676 m)  Wt 192 lb 3.2 oz (87.181 kg)  BMI 31.04 kg/m2  Physical Examination: Pelvic - normal external genitalia, vulva, vagina, cervix, uterus and adnexa, VULVA: vulvar excoriation from pads , vulvar lesion one stitch removed from 4 mm area that is 2 mm deep that is not fully healed. Has improved significantly, VAGINA: normal secretions    Assessment & Plan:   Wound recheck, slow but progressive healing Stitch removed. Will Rx hydrocodone x 20 more tabs.

## 2012-07-24 NOTE — Patient Instructions (Signed)
Continue current care, avoid use of pads as often as your currently using them. You May put, Neosporin on the incision site. Expect complete healing within a week avoid sexual activity until completely healed plus one week

## 2012-08-14 ENCOUNTER — Other Ambulatory Visit: Payer: Self-pay | Admitting: Family Medicine

## 2012-09-11 ENCOUNTER — Other Ambulatory Visit: Payer: Self-pay | Admitting: Family Medicine

## 2012-09-27 ENCOUNTER — Encounter: Payer: Self-pay | Admitting: Family Medicine

## 2012-09-27 ENCOUNTER — Other Ambulatory Visit (HOSPITAL_COMMUNITY)
Admission: RE | Admit: 2012-09-27 | Discharge: 2012-09-27 | Disposition: A | Discharge status: Home / Self Care | Payer: Medicare Other | Source: Ambulatory Visit | Attending: Family Medicine | Admitting: Family Medicine

## 2012-09-27 ENCOUNTER — Ambulatory Visit (INDEPENDENT_AMBULATORY_CARE_PROVIDER_SITE_OTHER): Payer: Medicare Other | Admitting: Family Medicine

## 2012-09-27 VITALS — BP 154/57 | HR 79 | Temp 98.8°F | Ht 66.0 in | Wt 190.0 lb

## 2012-09-27 DIAGNOSIS — Z2089 Contact with and (suspected) exposure to other communicable diseases: Secondary | ICD-10-CM | POA: Diagnosis not present

## 2012-09-27 DIAGNOSIS — Z7251 High risk heterosexual behavior: Secondary | ICD-10-CM

## 2012-09-27 DIAGNOSIS — B009 Herpesviral infection, unspecified: Secondary | ICD-10-CM

## 2012-09-27 DIAGNOSIS — N390 Urinary tract infection, site not specified: Secondary | ICD-10-CM | POA: Diagnosis not present

## 2012-09-27 DIAGNOSIS — N898 Other specified noninflammatory disorders of vagina: Secondary | ICD-10-CM

## 2012-09-27 DIAGNOSIS — A609 Anogenital herpesviral infection, unspecified: Secondary | ICD-10-CM

## 2012-09-27 DIAGNOSIS — Z113 Encounter for screening for infections with a predominantly sexual mode of transmission: Secondary | ICD-10-CM | POA: Diagnosis not present

## 2012-09-27 DIAGNOSIS — Z202 Contact with and (suspected) exposure to infections with a predominantly sexual mode of transmission: Secondary | ICD-10-CM | POA: Diagnosis not present

## 2012-09-27 DIAGNOSIS — N760 Acute vaginitis: Secondary | ICD-10-CM | POA: Diagnosis not present

## 2012-09-27 DIAGNOSIS — A64 Unspecified sexually transmitted disease: Secondary | ICD-10-CM | POA: Diagnosis not present

## 2012-09-27 DIAGNOSIS — R300 Dysuria: Secondary | ICD-10-CM

## 2012-09-27 LAB — POCT URINALYSIS DIPSTICK
Bilirubin, UA: NEGATIVE
Glucose, UA: NEGATIVE
Ketones, UA: NEGATIVE
Nitrite, UA: NEGATIVE
Protein, UA: NEGATIVE
Spec Grav, UA: 1.015
Urobilinogen, UA: 1
pH, UA: 7.5

## 2012-09-27 LAB — HIV ANTIBODY (ROUTINE TESTING W REFLEX): HIV: NONREACTIVE

## 2012-09-27 LAB — POCT WET PREP (WET MOUNT)

## 2012-09-27 LAB — CBC
MCH: 30.9 pg (ref 26.0–34.0)
MCHC: 35 g/dL (ref 30.0–36.0)
Platelets: 286 10*3/uL (ref 150–400)
RBC: 4.3 MIL/uL (ref 3.87–5.11)

## 2012-09-27 LAB — POCT UA - MICROSCOPIC ONLY

## 2012-09-27 MED ORDER — CEPHALEXIN 500 MG PO CAPS: 500.0000 mg | ORAL_CAPSULE | Freq: Three times a day (TID) | ORAL | Status: DC

## 2012-09-27 NOTE — Patient Instructions (Addendum)
Clinically it seems you have Herpes simplex. Also your urine is concerning for infection and with your symptoms antibiotic treatment is indicated. Please take it as prescribed and it is important that you complete treatment. Please follow up with your primary doctor in 2 weeks or sooner if you need it.

## 2012-09-27 NOTE — Progress Notes (Signed)
Family Medicine Office Visit Note   Subjective:   Patient ID: [name], female  DOB: Jul 25, 1960, 52 y.o.. MRN: [id number]   Pt that comes today for same day appointment complaining of rectal bleeding and dysuria.  Dysuria: Reports burning with urination, frequency and urgency. Also urine is reported to be darker with some discharge.  Blood in stools. Only when wiping. No mixed in stools. Reports intermittent pain with defecation. No constipation.  Review of Systems:  Per HPI plus Denies fever, chills, nausea, vomiting, diarrhea or flank pain  Objective:   Physical Exam: Gen:  NAD HEENT: Moist mucous membranes  CV: Regular rate and rhythm, no murmurs rubs or gallops PULM: Clear to auscultation bilaterally. No wheezes/rales/rhonchi ABD: Soft, non tender, non distended, normal bowel sounds. No CVA tenderness.  EXT: No edema Vulva and perianal area: Vulval vesicular lesions on left labia minora. #2. No other lesions. No visible hemorrhoids.  Speculum: Vagina and cervix of normal appearance, no friability, no discharge. Bimanual exam: Uterus anteverted, no adnexal masses. No cervical motion tenderness. Rectal exam: no hemorrhoids or strictures seen or felt. FOBT negative.   Assessment & Plan:

## 2012-09-28 LAB — HSV 2 ANTIBODY, IGG: HSV 2 Glycoprotein G Ab, IgG: 3.71 IV — ABNORMAL HIGH

## 2012-09-29 ENCOUNTER — Telehealth: Payer: Self-pay | Admitting: Family Medicine

## 2012-09-29 DIAGNOSIS — A609 Anogenital herpesviral infection, unspecified: Secondary | ICD-10-CM | POA: Insufficient documentation

## 2012-09-29 DIAGNOSIS — N390 Urinary tract infection, site not specified: Secondary | ICD-10-CM | POA: Insufficient documentation

## 2012-09-29 NOTE — Telephone Encounter (Signed)
Lab results please. She is STRESSED!

## 2012-09-29 NOTE — Assessment & Plan Note (Addendum)
Clinically. Pt very concerned about transmission. Valtrex not indicated with sores already open. Recommended to avoid sexual intercourse until sores are completely resolved HSV-2 to confirm diagnosis and if positive education about prevention of recurrences. HIV, RPR, CT and GC also ordered today.  Rectal bleeding reported when wiping. FOBT negative. No constipation or other symptoms. Negative physical exam for fissures or visible hemorrhoids . Blood could have come from this open vulvar sores. F/u by PCP if continues to be an issue after her sores have healed.

## 2012-09-29 NOTE — Assessment & Plan Note (Signed)
Symptomatic and UA suspicious for infection. P/ Keflex and Urine sent for culture F/u as needed

## 2012-09-29 NOTE — Telephone Encounter (Signed)
Called patient and dicussed positive result for HSV, told her to keep her upcoming appointment with her PCP and discuss her options at that visit.Busick, Rodena Medin

## 2012-10-11 ENCOUNTER — Other Ambulatory Visit: Payer: Self-pay | Admitting: Family Medicine

## 2012-10-12 ENCOUNTER — Encounter: Payer: Self-pay | Admitting: Family Medicine

## 2012-10-12 ENCOUNTER — Ambulatory Visit (INDEPENDENT_AMBULATORY_CARE_PROVIDER_SITE_OTHER): Payer: Medicare Other | Admitting: Family Medicine

## 2012-10-12 VITALS — BP 177/83 | HR 88 | Ht 66.0 in | Wt 190.9 lb

## 2012-10-12 DIAGNOSIS — A6000 Herpesviral infection of urogenital system, unspecified: Secondary | ICD-10-CM

## 2012-10-12 DIAGNOSIS — I10 Essential (primary) hypertension: Secondary | ICD-10-CM | POA: Diagnosis not present

## 2012-10-12 DIAGNOSIS — A609 Anogenital herpesviral infection, unspecified: Secondary | ICD-10-CM

## 2012-10-12 DIAGNOSIS — B009 Herpesviral infection, unspecified: Secondary | ICD-10-CM | POA: Diagnosis not present

## 2012-10-12 LAB — COMPREHENSIVE METABOLIC PANEL
Albumin: 4.2 g/dL (ref 3.5–5.2)
BUN: 10 mg/dL (ref 6–23)
CO2: 32 mEq/L (ref 19–32)
Glucose, Bld: 135 mg/dL — ABNORMAL HIGH (ref 70–99)
Potassium: 3.5 mEq/L (ref 3.5–5.3)
Sodium: 141 mEq/L (ref 135–145)
Total Protein: 7.1 g/dL (ref 6.0–8.3)

## 2012-10-12 MED ORDER — VALACYCLOVIR HCL 1 G PO TABS: 1000.0000 mg | ORAL_TABLET | Freq: Two times a day (BID) | ORAL | Status: DC

## 2012-10-12 MED ORDER — VALACYCLOVIR HCL 500 MG PO TABS: 500.0000 mg | ORAL_TABLET | Freq: Two times a day (BID) | ORAL | Status: DC

## 2012-10-12 NOTE — Progress Notes (Signed)
  Subjective:    Patient ID: [name], female    DOB: February 19, 1960, 52 y.o.   MRN: [id number]  HPI  Pt here to discuss new diiagnosis of genital herpes  States that she is still having a painful eruption and that this is the first time she has ever had this problem. She has been monogamous with her husband for 31 years, and he is not admitting to sexual activity outside of marriage. She is also having malaise, trouble sleeping, and nausea since the diagnosis.   She states that she is very worried about her diagnosis, worried shell have to take medicine everyday for the rest life, and states that she will not sleep with her husband again.   She complains of dryness in BL mouth/lip angles and a thick film that needs to be scraped off Also complains of Low back pain with numbness and tingling in her L hand, she does not have bowel/bladder dysf, LE weakness/prob walking, or saddle anesthesia.   Review of Systems Per HPI    Objective:   Physical Exam  Gen: NAD, alert, cooperative with exam HEENT: oral mucosa moist without rash or appreciable white film she is referring to.   Neuro: Alert and oriented, No gross deficits, normal gait Psych: slightly anxious affect but seems appropriate.      Assessment & Plan:  Per problem specific plans

## 2012-10-12 NOTE — Assessment & Plan Note (Signed)
Elevated today, hasnt taken meds and anxious about HSV, f/u 2 -4 weeks.

## 2012-10-12 NOTE — Assessment & Plan Note (Addendum)
Still inflamed and bothering her Discussed infectivity, recurrence, and treatment Will treat as primary infection with 1 g valtrex BID X 10 days, Also gave 18 500 mg tabs for outbreaks with instructions to take BID for 3 days when symotms arise With her complaint of thick white film in mouth will check HIV again, perhaps not seroconverted at the time tested last, not appreciated on exam CMP collected for baseline LFTs and renal function starting valtrex

## 2012-10-12 NOTE — Patient Instructions (Signed)
It was great to meet you today!  Lets follow up in 2-4 weeks to talka bout back pain and HTN  Read through the information I gave you and bring your questions back when we meet.

## 2012-10-13 LAB — HIV ANTIBODY (ROUTINE TESTING W REFLEX): HIV: NONREACTIVE

## 2012-10-26 ENCOUNTER — Telehealth: Payer: Self-pay | Admitting: Family Medicine

## 2012-10-26 NOTE — Telephone Encounter (Signed)
Pt c/o continued irritation of lesions and vaginal discharge.  Instructed patient needs OV.  Pt to call and schedule appt to be seen again.   Tedra Coppernoll, Darlyne Russian, CMA

## 2012-10-26 NOTE — Telephone Encounter (Signed)
Pt is on the standby bottle of pills for the irritation  And discharge. She wants to know what to do next. She has taken all teh 1000 mg pills and is now on the 500. Please advise

## 2012-10-30 ENCOUNTER — Telehealth: Payer: Self-pay | Admitting: Family Medicine

## 2012-10-30 ENCOUNTER — Ambulatory Visit: Payer: Medicare Other | Admitting: Family Medicine

## 2012-10-30 NOTE — Telephone Encounter (Signed)
Patient would like to speak to MD/nurse about current meds.

## 2012-10-30 NOTE — Telephone Encounter (Signed)
Called pt. She used the valtrex 1000 mg bid for 10 days. She feels much better. She still has the valtrex 500 mg and used some of them, but will use them if she has another out break. She has upcoming appt with Dr.Bradshaw 11/22/12. Lorenda Hatchet, Renato Battles

## 2012-11-12 ENCOUNTER — Other Ambulatory Visit: Payer: Self-pay | Admitting: Family Medicine

## 2012-11-22 ENCOUNTER — Telehealth: Payer: Self-pay

## 2012-11-22 ENCOUNTER — Ambulatory Visit: Payer: Medicare Other | Admitting: Family Medicine

## 2012-11-22 MED ORDER — ALBUTEROL SULFATE HFA 108 (90 BASE) MCG/ACT IN AERS: 2.0000 | INHALATION_SPRAY | RESPIRATORY_TRACT | Status: DC | PRN

## 2012-11-22 NOTE — Telephone Encounter (Signed)
Refilled albuterol.   Murtis Sink, MD [facility] Resident, PGY-2 11/22/2012, 5:55 PM

## 2012-11-22 NOTE — Telephone Encounter (Signed)
Forward to PCP for request.Busick, Rodena Medin

## 2012-11-22 NOTE — Telephone Encounter (Signed)
Refill request for albuterol inhaler

## 2012-12-05 ENCOUNTER — Other Ambulatory Visit: Payer: Self-pay | Admitting: Family Medicine

## 2012-12-13 ENCOUNTER — Other Ambulatory Visit: Payer: Self-pay | Admitting: Family Medicine

## 2012-12-13 MED ORDER — OMEPRAZOLE 40 MG PO CPDR: 40.0000 mg | DELAYED_RELEASE_CAPSULE | Freq: Every day | ORAL | Status: DC

## 2012-12-13 NOTE — Telephone Encounter (Signed)
Refilled omeprazole.   Murtis Sink, MD [facility] Resident, PGY-2 12/13/2012, 3:19 PM

## 2012-12-18 ENCOUNTER — Other Ambulatory Visit: Payer: Self-pay | Admitting: Family Medicine

## 2013-01-08 ENCOUNTER — Emergency Department (HOSPITAL_COMMUNITY)
Admission: EM | Admit: 2013-01-08 | Discharge: 2013-01-08 | Disposition: A | Discharge status: Home / Self Care | Payer: Medicare Other | Attending: Emergency Medicine | Admitting: Emergency Medicine

## 2013-01-08 ENCOUNTER — Encounter (HOSPITAL_COMMUNITY): Payer: Self-pay | Admitting: Emergency Medicine

## 2013-01-08 DIAGNOSIS — I10 Essential (primary) hypertension: Secondary | ICD-10-CM | POA: Insufficient documentation

## 2013-01-08 DIAGNOSIS — H109 Unspecified conjunctivitis: Secondary | ICD-10-CM | POA: Diagnosis not present

## 2013-01-08 DIAGNOSIS — K219 Gastro-esophageal reflux disease without esophagitis: Secondary | ICD-10-CM | POA: Insufficient documentation

## 2013-01-08 DIAGNOSIS — Z79899 Other long term (current) drug therapy: Secondary | ICD-10-CM | POA: Insufficient documentation

## 2013-01-08 DIAGNOSIS — F3289 Other specified depressive episodes: Secondary | ICD-10-CM | POA: Insufficient documentation

## 2013-01-08 DIAGNOSIS — F329 Major depressive disorder, single episode, unspecified: Secondary | ICD-10-CM | POA: Diagnosis not present

## 2013-01-08 DIAGNOSIS — Z8739 Personal history of other diseases of the musculoskeletal system and connective tissue: Secondary | ICD-10-CM | POA: Diagnosis not present

## 2013-01-08 DIAGNOSIS — Z87891 Personal history of nicotine dependence: Secondary | ICD-10-CM | POA: Diagnosis not present

## 2013-01-08 DIAGNOSIS — Z791 Long term (current) use of non-steroidal anti-inflammatories (NSAID): Secondary | ICD-10-CM | POA: Insufficient documentation

## 2013-01-08 DIAGNOSIS — Z8639 Personal history of other endocrine, nutritional and metabolic disease: Secondary | ICD-10-CM | POA: Insufficient documentation

## 2013-01-08 DIAGNOSIS — J45909 Unspecified asthma, uncomplicated: Secondary | ICD-10-CM | POA: Diagnosis not present

## 2013-01-08 DIAGNOSIS — Z862 Personal history of diseases of the blood and blood-forming organs and certain disorders involving the immune mechanism: Secondary | ICD-10-CM | POA: Diagnosis not present

## 2013-01-08 DIAGNOSIS — H1030 Unspecified acute conjunctivitis, unspecified eye: Secondary | ICD-10-CM | POA: Diagnosis not present

## 2013-01-08 DIAGNOSIS — Z85118 Personal history of other malignant neoplasm of bronchus and lung: Secondary | ICD-10-CM | POA: Diagnosis not present

## 2013-01-08 MED ORDER — TOBRAMYCIN 0.3 % OP SOLN: 1.0000 [drp] | OPHTHALMIC | Status: DC

## 2013-01-08 NOTE — ED Notes (Signed)
Patient with no complaints at this time. Respirations even and unlabored. Skin warm/dry. Discharge instructions reviewed with patient at this time. Patient given opportunity to voice concerns/ask questions. Patient discharged at this time and left Emergency Department with steady gait.   

## 2013-01-08 NOTE — ED Notes (Signed)
Pt c/o pain, itching, and swelling to left eye and left side of face for the past 4 days.

## 2013-01-08 NOTE — ED Provider Notes (Signed)
CSN: [id number]     Arrival date & time 01/08/13  0945 History  This chart was scribed for [name], MD by [name], ED Scribe. This patient was seen in room APA08/APA08 and the patient's care was started 11:41 AM.    Chief Complaint  Patient presents with  . Eye Pain    Patient is a 52 y.o. female presenting with eye pain. The history is provided by the patient. No language interpreter was used.  Eye Pain This is a new problem. The current episode started more than 2 days ago. The problem occurs constantly. The problem has been gradually worsening. Pertinent negatives include no chest pain, no abdominal pain and no headaches. Nothing aggravates the symptoms. Nothing relieves the symptoms. She has tried nothing for the symptoms.    HPI Comments: [name] is a 52 y.o. female who presents to the Emergency Department complaining of 4 days of gradual onset, gradually worsening, constant left eye pain, swelling and itching. She states the right eye is also beginning to feel the same. She reports associated left eye drainage as well.    Past Medical History  Diagnosis Date  . HTN (hypertension)   . HLD (hyperlipidemia)   . Osteoarthrosis involving more than one site but not generalized   . Depression   . GERD (gastroesophageal reflux disease)   . Cancer     lung  . Asthma    Past Surgical History  Procedure Laterality Date  . Tubal ligation    . Abdominal hysterectomy    . Abdominal surgery      ovarian cyst removal  . Lung cancer surgery    . Bladder surgery  suspension x4    x 4  . Lobectomy Left   . Excision vaginal cyst Left 06/20/2012    Procedure: EXCISION LEFT LABIAL CYST;  Surgeon: [name], MD;  Location: [facility];  Service: Gynecology;  Laterality: Left;   Family History  Problem Relation Age of Onset  . Hypertension Mother   . Kidney disease Mother   . Hypertension Father   . Kidney disease Father   . Heart disease Father   . Cancer Father      leukemia  . Hypertension Sister   . Hypertension Sister   . Diabetes Other   . Heart disease Other     has pace maker   History  Substance Use Topics  . Smoking status: Former Smoker    Types: Cigarettes    Quit date: 04/07/1995  . Smokeless tobacco: Never Used  . Alcohol Use: No   OB History   Grav Para Term Preterm Abortions TAB SAB Ect Mult Living                 Review of Systems  Constitutional: Negative for appetite change and fatigue.  HENT: Negative for congestion, ear discharge and sinus pressure.   Eyes: Positive for pain, discharge and itching.  Respiratory: Negative for cough.   Cardiovascular: Negative for chest pain.  Gastrointestinal: Negative for abdominal pain and diarrhea.  Genitourinary: Negative for frequency and hematuria.  Musculoskeletal: Negative for back pain.  Skin: Negative for rash.  Neurological: Negative for seizures and headaches.  Psychiatric/Behavioral: Negative for hallucinations.    Allergies  Promethazine hcl  Home Medications   Current Outpatient Rx  Name  Route  Sig  Dispense  Refill  . albuterol (PROAIR HFA) 108 (90 BASE) MCG/ACT inhaler   Inhalation   Inhale 2 puffs into the  lungs every 4 (four) hours as needed. for shortness of breath   1 each   2   . amitriptyline (ELAVIL) 150 MG tablet      TAKE ONE TABLET BY MOUTH AT BEDTIME   90 tablet   0   . Calcium Citrate-Vitamin D 250-200 MG-UNIT TABS   Oral   Take 1 tablet by mouth daily.          . DULoxetine (CYMBALTA) 60 MG capsule   Oral   Take 1 capsule (60 mg total) by mouth 2 (two) times daily.   60 capsule   5   . gabapentin (NEURONTIN) 300 MG capsule      Take one tab qhs x1 week, then increase to bid x1 week, then tid   90 capsule   3     Sig should read, one cap TID   . hydrochlorothiazide (HYDRODIURIL) 25 MG tablet      TAKE ONE TABLET BY MOUTH ONCE DAILY   90 tablet   0   . Ketoprofen POWD      Please compound into a 20% gel   30 g   2    . meloxicam (MOBIC) 15 MG tablet      TAKE ONE TABLET BY MOUTH ONCE DAILY. NEEDS APPOINTMENT FOR ADDITIONAL REFILLS   30 tablet   0   . omeprazole (PRILOSEC) 40 MG capsule   Oral   Take 1 capsule (40 mg total) by mouth daily.   90 capsule   3   . cyclobenzaprine (FLEXERIL) 10 MG tablet   Oral   Take 1 tablet (10 mg total) by mouth at bedtime as needed for muscle spasms.   30 tablet   3   . valACYclovir (VALTREX) 1000 MG tablet   Oral   Take 1 tablet (1,000 mg total) by mouth 2 (two) times daily.   20 tablet   0   . valACYclovir (VALTREX) 500 MG tablet   Oral   Take 1 tablet (500 mg total) by mouth 2 (two) times daily. When symptoms are present   18 tablet   0    BP 127/62  Pulse 96  Temp(Src) 98.2 F (36.8 C) (Oral)  Resp 18  Ht 5\' 6"  (1.676 m)  Wt 191 lb (86.637 kg)  BMI 30.84 kg/m2  SpO2 96% Physical Exam  Nursing note and vitals reviewed. Constitutional: She is oriented to person, place, and time. She appears well-developed.  HENT:  Head: Normocephalic.  Eyes: Pupils are equal, round, and reactive to light.  Left eye: conjunctiva is inflamed. No foreign body noted.   Neck: No tracheal deviation present.  Cardiovascular:  No murmur heard. Musculoskeletal: Normal range of motion.  Neurological: She is oriented to person, place, and time.  Skin: Skin is warm.  Psychiatric: She has a normal mood and affect.    ED Course  Procedures   DIAGNOSTIC STUDIES: Oxygen Saturation is 96% on RA, adequate by my interpretation.    COORDINATION OF CARE: 11:40 AM Discussed treatment plan with pt at bedside and pt agreed to plan.   Labs Review Labs Reviewed - No data to display Imaging Review No results found.  EKG Interpretation   None       MDM  conjunctivitis  [name], MD 01/08/13 1525

## 2013-01-09 ENCOUNTER — Other Ambulatory Visit: Payer: Self-pay | Admitting: Family Medicine

## 2013-01-10 ENCOUNTER — Other Ambulatory Visit: Payer: Self-pay | Admitting: Family Medicine

## 2013-01-10 MED ORDER — GABAPENTIN 300 MG PO CAPS: ORAL_CAPSULE | ORAL | Status: DC

## 2013-01-18 DIAGNOSIS — R002 Palpitations: Secondary | ICD-10-CM

## 2013-01-18 HISTORY — DX: Palpitations: R00.2

## 2013-01-31 ENCOUNTER — Ambulatory Visit: Payer: Medicare Other | Admitting: Family Medicine

## 2013-02-10 ENCOUNTER — Other Ambulatory Visit: Payer: Self-pay | Admitting: Family Medicine

## 2013-02-12 ENCOUNTER — Other Ambulatory Visit: Payer: Self-pay | Admitting: Family Medicine

## 2013-02-12 MED ORDER — HYDROCHLOROTHIAZIDE 25 MG PO TABS: 25.0000 mg | ORAL_TABLET | Freq: Every day | ORAL | Status: DC

## 2013-02-15 ENCOUNTER — Ambulatory Visit (HOSPITAL_COMMUNITY)
Admission: RE | Admit: 2013-02-15 | Discharge: 2013-02-15 | Disposition: A | Discharge status: Home / Self Care | Payer: Medicare Other | Source: Ambulatory Visit | Attending: Family Medicine | Admitting: Family Medicine

## 2013-02-15 ENCOUNTER — Encounter: Payer: Self-pay | Admitting: Family Medicine

## 2013-02-15 ENCOUNTER — Ambulatory Visit (INDEPENDENT_AMBULATORY_CARE_PROVIDER_SITE_OTHER): Payer: Medicare Other | Admitting: Family Medicine

## 2013-02-15 VITALS — BP 170/80 | HR 84 | Temp 98.2°F | Ht 66.0 in | Wt 190.0 lb

## 2013-02-15 DIAGNOSIS — E7800 Pure hypercholesterolemia, unspecified: Secondary | ICD-10-CM | POA: Diagnosis not present

## 2013-02-15 DIAGNOSIS — R002 Palpitations: Secondary | ICD-10-CM | POA: Insufficient documentation

## 2013-02-15 DIAGNOSIS — F339 Major depressive disorder, recurrent, unspecified: Secondary | ICD-10-CM | POA: Diagnosis not present

## 2013-02-15 DIAGNOSIS — E669 Obesity, unspecified: Secondary | ICD-10-CM | POA: Insufficient documentation

## 2013-02-15 DIAGNOSIS — K117 Disturbances of salivary secretion: Secondary | ICD-10-CM

## 2013-02-15 DIAGNOSIS — I10 Essential (primary) hypertension: Secondary | ICD-10-CM

## 2013-02-15 DIAGNOSIS — R682 Dry mouth, unspecified: Secondary | ICD-10-CM

## 2013-02-15 LAB — CBC WITH DIFFERENTIAL/PLATELET
BASOS PCT: 0 % (ref 0–1)
Basophils Absolute: 0 10*3/uL (ref 0.0–0.1)
EOS ABS: 0.2 10*3/uL (ref 0.0–0.7)
Eosinophils Relative: 4 % (ref 0–5)
HCT: 39.2 % (ref 36.0–46.0)
HEMOGLOBIN: 13.7 g/dL (ref 12.0–15.0)
LYMPHS ABS: 1.8 10*3/uL (ref 0.7–4.0)
Lymphocytes Relative: 39 % (ref 12–46)
MCH: 31.1 pg (ref 26.0–34.0)
MCHC: 34.9 g/dL (ref 30.0–36.0)
MCV: 89.1 fL (ref 78.0–100.0)
MONOS PCT: 6 % (ref 3–12)
Monocytes Absolute: 0.3 10*3/uL (ref 0.1–1.0)
NEUTROS ABS: 2.3 10*3/uL (ref 1.7–7.7)
NEUTROS PCT: 51 % (ref 43–77)
PLATELETS: 281 10*3/uL (ref 150–400)
RBC: 4.4 MIL/uL (ref 3.87–5.11)
RDW: 13.1 % (ref 11.5–15.5)
WBC: 4.5 10*3/uL (ref 4.0–10.5)

## 2013-02-15 LAB — BASIC METABOLIC PANEL
BUN: 7 mg/dL (ref 6–23)
CALCIUM: 9.2 mg/dL (ref 8.4–10.5)
CO2: 32 mEq/L (ref 19–32)
CREATININE: 0.85 mg/dL (ref 0.50–1.10)
Chloride: 101 mEq/L (ref 96–112)
Glucose, Bld: 103 mg/dL — ABNORMAL HIGH (ref 70–99)
Potassium: 3.5 mEq/L (ref 3.5–5.3)
Sodium: 139 mEq/L (ref 135–145)

## 2013-02-15 LAB — LIPID PANEL
CHOL/HDL RATIO: 6.6 ratio
Cholesterol: 226 mg/dL — ABNORMAL HIGH (ref 0–200)
HDL: 34 mg/dL — AB (ref >39)
LDL CALC: 154 mg/dL — AB (ref 0–99)
TRIGLYCERIDES: 189 mg/dL — AB (ref <150)
VLDL: 38 mg/dL (ref 0–40)

## 2013-02-15 LAB — TSH: TSH: 0.572 u[IU]/mL (ref 0.350–4.500)

## 2013-02-15 MED ORDER — NORTRIPTYLINE HCL 50 MG PO CAPS: 50.0000 mg | ORAL_CAPSULE | Freq: Every day | ORAL | Status: DC

## 2013-02-15 MED ORDER — ATORVASTATIN CALCIUM 40 MG PO TABS: 40.0000 mg | ORAL_TABLET | Freq: Every day | ORAL | Status: DC

## 2013-02-15 NOTE — Assessment & Plan Note (Signed)
Discuss lifestyle changes

## 2013-02-15 NOTE — Assessment & Plan Note (Signed)
Likely SVT from her story Collected some risk stratification labs TSH, lipid panel, BMP, CBC Wanted to recheck A1c, however insurance wouldn't pay for it Will get fasting blood glucose with BMP today Considering that these are exertional and somewhat frequent I think cardiology referral is most appropriate. EKG today normal sinus rhythm

## 2013-02-15 NOTE — Assessment & Plan Note (Signed)
Worsened by her description, no objective testing today Will change amitriptyline to nortriptyline for dry mouth and conjunctivitis. Followup 3-4 weeks to see if change has helped her mood and or  anticholinergic side effects

## 2013-02-15 NOTE — Patient Instructions (Addendum)
It was great to see you, I think you should see a heart doctor.   Please come back in 3-4 weeks to follow up for your dry mouth.   You will be called for an appointment.   Palpitations  A palpitation is the feeling that your heartbeat is irregular or is faster than normal. It may feel like your heart is fluttering or skipping a beat. Palpitations are usually not a serious problem. However, in some cases, you may need further medical evaluation. CAUSES  Palpitations can be caused by:  Smoking.  Caffeine or other stimulants, such as diet pills or energy drinks.  Alcohol.  Stress and anxiety.  Strenuous physical activity.  Fatigue.  Certain medicines.  Heart disease, especially if you have a history of arrhythmias. This includes atrial fibrillation, atrial flutter, or supraventricular tachycardia.  An improperly working pacemaker or defibrillator. DIAGNOSIS  To find the cause of your palpitations, your caregiver will take your history and perform a physical exam. Tests may also be done, including:  Electrocardiography (ECG). This test records the heart's electrical activity.  Cardiac monitoring. This allows your caregiver to monitor your heart rate and rhythm in real time.  Holter monitor. This is a portable device that records your heartbeat and can help diagnose heart arrhythmias. It allows your caregiver to track your heart activity for several days, if needed.  Stress tests by exercise or by giving medicine that makes the heart beat faster. TREATMENT  Treatment of palpitations depends on the cause of your symptoms and can vary greatly. Most cases of palpitations do not require any treatment other than time, relaxation, and monitoring your symptoms. Other causes, such as atrial fibrillation, atrial flutter, or supraventricular tachycardia, usually require further treatment. HOME CARE INSTRUCTIONS   Avoid:  Caffeinated coffee, tea, soft drinks, diet pills, and energy  drinks.  Chocolate.  Alcohol.  Stop smoking if you smoke.  Reduce your stress and anxiety. Things that can help you relax include:  A method that measures bodily functions so you can learn to control them (biofeedback).  Yoga.  Meditation.  Physical activity such as swimming, jogging, or walking.  Get plenty of rest and sleep. SEEK MEDICAL CARE IF:   You continue to have a fast or irregular heartbeat beyond 24 hours.  Your palpitations occur more often. SEEK IMMEDIATE MEDICAL CARE IF:  You develop chest pain or shortness of breath.  You have a severe headache.  You feel dizzy, or you faint. MAKE SURE YOU:  Understand these instructions.  Will watch your condition.  Will get help right away if you are not doing well or get worse. Document Released: 01/02/2000 Document Revised: 05/01/2012 Document Reviewed: 03/05/2011 [facility] Patient Information 2014 [place].

## 2013-02-15 NOTE — Assessment & Plan Note (Addendum)
Uncontrolled currently, states she hasn't taken her medications On only HCTZ, will add beta blocker with her palpitations next visit if still elevated

## 2013-02-15 NOTE — Assessment & Plan Note (Signed)
Checking fasting lipid panel today Using last lipid panel from 2005 concurrent risk factors her 10 year ASCVD score is 18.4 Starting Lipitor 40 today

## 2013-02-15 NOTE — Assessment & Plan Note (Signed)
Onset approximately 2 months ago, intermittent Considered Sjogren's syndrome, however she sweats normally and she is on 150 mg of amitriptyline Most likely anticholinergic side effects Will change amitriptyline to nortriptyline to see if we can reduce the side effects and a similar treatment Used 50 mg of nortriptyline each bedtime as she uses it mostly for sleep, and Cymbalta seems to be the mainstay for depression treatment in this patient

## 2013-02-15 NOTE — Progress Notes (Addendum)
Patient ID: [name], female   DOB: 22-Jul-1960, 53 y.o.   MRN: [id number]  Kenn File, MD Phone: [phone]  Subjective:  Chief complaint-noted  Patient here for followup of conjunctivitis, dry mouth, and palpitations  Palpitations She mentions that for several months she's had palpitations described as a racing heart for about 5 minutes it is worse with exertion and resolves with rest. She denies chest pain, dyspnea. She endorses that she gets lightheaded and nauseous with these episodes that they really scare her.  Dry mouth, conjunctivitis She states that for approximately 2 months she's had dry mouth and subsequent crusty sores on her mouth. She also complains of conjunctivitis which she was seen in the ED for. For the conjunctivitis use use tobramycin drops for total of 2 weeks. For the sores in her mouth she's used Vaseline lip care, and plain Vaseline. She states that her dry mouth symptoms aren't consistent with a feeling of not having enough saliva and not being able to swallow. The symptoms are worse in the morning.-  She's taken Elavil for "years" with no problems. She initially started taking it for sleep and doesn't seem to be helping as much anymore. She also states that her mood is depressed she denies suicidal ideation.  Hypertension- States she never checks her blood pressure at home, has not taken her medication this morning. Reports good compliance with hydrochlorothiazide Endorses approximately 2 headaches per week lasting 30-40 minutes that self resolved Positive palpitations as described above Denies chest pain and dyspnea  ROS- Per [facility]  Past Medical History Patient Active Problem List   Diagnosis Date Noted  . Palpitations 02/15/2013  . Dry mouth 02/15/2013  . Obesity, unspecified 02/15/2013  . Genital herpes 10/12/2012  . HSV (herpes simplex virus) anogenital infection 09/29/2012  . Sebaceous cyst of labia,left 06/16/2012  . UNSPECIFIED SLEEP  APNEA 12/04/2008  . ADENOCARCINOMA, LUNG 10/09/2008  . OSTEOARTHROSIS, MULTIPLE SITES, NOT GNRLZD 07/13/2006  . HYPERCHOLESTEROLEMIA 03/17/2006  . DEPRESSION, MAJOR, RECURRENT 03/17/2006  . HYPERTENSION, BENIGN SYSTEMIC 03/17/2006  . GASTROESOPHAGEAL REFLUX, NO ESOPHAGITIS 03/17/2006  . INSOMNIA NOS 03/17/2006    Medications- reviewed and updated Current Outpatient Prescriptions  Medication Sig Dispense Refill  . albuterol (PROAIR HFA) 108 (90 BASE) MCG/ACT inhaler Inhale 2 puffs into the lungs every 4 (four) hours as needed. for shortness of breath  1 each  2  . amitriptyline (ELAVIL) 150 MG tablet TAKE ONE TABLET BY MOUTH AT BEDTIME  90 tablet  0  . atorvastatin (LIPITOR) 40 MG tablet Take 1 tablet (40 mg total) by mouth daily.  30 tablet  11  . Calcium Citrate-Vitamin D 250-200 MG-UNIT TABS Take 1 tablet by mouth daily.       . cyclobenzaprine (FLEXERIL) 10 MG tablet Take 1 tablet (10 mg total) by mouth at bedtime as needed for muscle spasms.  30 tablet  3  . DULoxetine (CYMBALTA) 60 MG capsule Take 1 capsule (60 mg total) by mouth 2 (two) times daily.  60 capsule  5  . gabapentin (NEURONTIN) 300 MG capsule Take one tab qhs x1 week, then increase to bid x1 week, then tid  90 capsule  5  . hydrochlorothiazide (HYDRODIURIL) 25 MG tablet Take 1 tablet (25 mg total) by mouth daily.  90 tablet  3  . Ketoprofen POWD Please compound into a 20% gel  30 g  2  . meloxicam (MOBIC) 15 MG tablet TAKE ONE TABLET BY MOUTH ONCE DAILY (NEED AN APPOINTMENT FOR MORE REFILLS)  30 tablet  0  . nortriptyline (PAMELOR) 50 MG capsule Take 1 capsule (50 mg total) by mouth at bedtime.  30 capsule  1  . omeprazole (PRILOSEC) 40 MG capsule Take 1 capsule (40 mg total) by mouth daily.  90 capsule  3  . tobramycin (TOBREX) 0.3 % ophthalmic solution Place 1 drop into the left eye every 4 (four) hours.  5 mL  0  . valACYclovir (VALTREX) 1000 MG tablet Take 1 tablet (1,000 mg total) by mouth 2 (two) times daily.  20  tablet  0  . valACYclovir (VALTREX) 500 MG tablet Take 1 tablet (500 mg total) by mouth 2 (two) times daily. When symptoms are present  18 tablet  0   No current facility-administered medications for this visit.    Objective: BP 170/80  Pulse 84  Temp(Src) 98.2 F (36.8 C) (Oral)  Ht 5\' 6"  (1.676 m)  Wt 190 lb (86.183 kg)  BMI 30.68 kg/m2 Gen: NAD, alert, cooperative with exam HEENT: NCAT, EOMI, PERRL CV: RRR, good S1/S2, no murmur Resp: CTABL, no wheezes, non-labored Neuro: Alert and oriented, No gross deficits   Assessment/Plan:  DEPRESSION, MAJOR, RECURRENT Worsened by her description, no objective testing today Will change amitriptyline to nortriptyline for dry mouth and conjunctivitis. Followup 3-4 weeks to see if change has helped her mood and or  anticholinergic side effects  Dry mouth Onset approximately 2 months ago, intermittent Considered Sjogren's syndrome, however she sweats normally and she is on 150 mg of amitriptyline Most likely anticholinergic side effects Will change amitriptyline to nortriptyline to see if we can reduce the side effects and a similar treatment Used 50 mg of nortriptyline each bedtime as she uses it mostly for sleep, and Cymbalta seems to be the mainstay for depression treatment in this patient   HYPERCHOLESTEROLEMIA Checking fasting lipid panel today Using last lipid panel from 2005 concurrent risk factors her 10 year ASCVD score is 18.4 Starting Lipitor 40 today  HYPERTENSION, BENIGN SYSTEMIC Uncontrolled currently, states she hasn't taken her medications On only HCTZ, will add beta blocker with her palpitations next visit if still elevated  Obesity, unspecified Discuss lifestyle changes  Palpitations Likely SVT from her story Collected some risk stratification labs TSH, lipid panel, BMP, CBC Wanted to recheck A1c, however insurance wouldn't pay for it Will get fasting blood glucose with BMP today Considering that these are  exertional and somewhat frequent I think cardiology referral is most appropriate. EKG today normal sinus rhythm    Orders Placed This Encounter  Procedures  . Lipid Panel  . TSH  . Basic Metabolic Panel  . CBC with Differential  . EKG 12-Lead  . EKG 12-Lead    Ordered by an unspecified provider     Meds ordered this encounter  Medications  . nortriptyline (PAMELOR) 50 MG capsule    Sig: Take 1 capsule (50 mg total) by mouth at bedtime.    Dispense:  30 capsule    Refill:  1  . atorvastatin (LIPITOR) 40 MG tablet    Sig: Take 1 tablet (40 mg total) by mouth daily.    Dispense:  30 tablet    Refill:  11

## 2013-02-22 ENCOUNTER — Encounter: Payer: Self-pay | Admitting: Family Medicine

## 2013-03-01 ENCOUNTER — Ambulatory Visit (INDEPENDENT_AMBULATORY_CARE_PROVIDER_SITE_OTHER): Payer: Medicare Other | Admitting: Family Medicine

## 2013-03-01 ENCOUNTER — Encounter: Payer: Self-pay | Admitting: Family Medicine

## 2013-03-01 VITALS — BP 148/79 | HR 80 | Temp 98.2°F | Ht 66.0 in | Wt 188.0 lb

## 2013-03-01 DIAGNOSIS — F339 Major depressive disorder, recurrent, unspecified: Secondary | ICD-10-CM

## 2013-03-01 DIAGNOSIS — G4700 Insomnia, unspecified: Secondary | ICD-10-CM

## 2013-03-01 DIAGNOSIS — K117 Disturbances of salivary secretion: Secondary | ICD-10-CM | POA: Diagnosis not present

## 2013-03-01 DIAGNOSIS — R682 Dry mouth, unspecified: Secondary | ICD-10-CM

## 2013-03-01 LAB — RHEUMATOID FACTOR

## 2013-03-01 MED ORDER — BIOTENE DRY MOUTH MT LIQD: 15.0000 mL | OROMUCOSAL | Status: DC | PRN

## 2013-03-01 MED ORDER — TRAZODONE HCL 50 MG PO TABS: 50.0000 mg | ORAL_TABLET | Freq: Every evening | ORAL | Status: DC | PRN

## 2013-03-01 MED ORDER — AMITRIPTYLINE HCL 150 MG PO TABS: 75.0000 mg | ORAL_TABLET | Freq: Every day | ORAL | Status: DC

## 2013-03-01 NOTE — Patient Instructions (Signed)
Take 1/2 amitriptyline pill for the next 2 weeks then stop  Start trazodone 1-2 pills every night to help you sleep  Come back in 3-4 weeks for reevaluation

## 2013-03-01 NOTE — Assessment & Plan Note (Signed)
Worsened after stopping amitriptyline, would like to get off of amitriptyline after discussion anticholinergics Started trazodone for sleep Followup one month

## 2013-03-01 NOTE — Assessment & Plan Note (Addendum)
Slightly worse since stopping amitriptyline, I anticipate we might need to add additional therapy such as Abilify after she can stop the amitriptyline completely. Also discontinue nortriptyline this time, she was only taking nortriptyline but we will use amitriptyline to wean after discussion. Add trazodone for sleep

## 2013-03-01 NOTE — Progress Notes (Signed)
Patient ID: [name], female   DOB: [birth year], 53 y.o.   MRN: [id number]  Kenn File, MD Phone: [phone]  Subjective:  Chief complaint-noted  # Followup for dry mouth and depression    States that her dry mouth and dry eyes have not improved with stopping amitriptyline. She started nortriptyline as prescribed and is not sleeping as well. She did try taking 2 nortriptyline and fell asleep several hours later and then awakened early.  She states that her mood seems to have worsened, most notably she is more irritable, since stopping amitriptyline. She's also been on gabapentin for several years. She notes that she sweats normally. - ROS-  Past Medical History Patient Active Problem List   Diagnosis Date Noted  . Palpitations 02/15/2013  . Dry mouth 02/15/2013  . Obesity, unspecified 02/15/2013  . Genital herpes 10/12/2012  . HSV (herpes simplex virus) anogenital infection 09/29/2012  . Sebaceous cyst of labia,left 06/16/2012  . UNSPECIFIED SLEEP APNEA 12/04/2008  . ADENOCARCINOMA, LUNG 10/09/2008  . OSTEOARTHROSIS, MULTIPLE SITES, NOT GNRLZD 07/13/2006  . HYPERCHOLESTEROLEMIA 03/17/2006  . DEPRESSION, MAJOR, RECURRENT 03/17/2006  . HYPERTENSION, BENIGN SYSTEMIC 03/17/2006  . GASTROESOPHAGEAL REFLUX, NO ESOPHAGITIS 03/17/2006  . INSOMNIA NOS 03/17/2006    Medications- reviewed and updated Current Outpatient Prescriptions  Medication Sig Dispense Refill  . albuterol (PROAIR HFA) 108 (90 BASE) MCG/ACT inhaler Inhale 2 puffs into the lungs every 4 (four) hours as needed. for shortness of breath  1 each  2  . atorvastatin (LIPITOR) 40 MG tablet Take 1 tablet (40 mg total) by mouth daily.  30 tablet  11  . Calcium Citrate-Vitamin D 250-200 MG-UNIT TABS Take 1 tablet by mouth daily.       . cyclobenzaprine (FLEXERIL) 10 MG tablet Take 1 tablet (10 mg total) by mouth at bedtime as needed for muscle spasms.  30 tablet  3  . DULoxetine (CYMBALTA) 60 MG capsule Take 1  capsule (60 mg total) by mouth 2 (two) times daily.  60 capsule  5  . gabapentin (NEURONTIN) 300 MG capsule Take one tab qhs x1 week, then increase to bid x1 week, then tid  90 capsule  5  . hydrochlorothiazide (HYDRODIURIL) 25 MG tablet Take 1 tablet (25 mg total) by mouth daily.  90 tablet  3  . Ketoprofen POWD Please compound into a 20% gel  30 g  2  . meloxicam (MOBIC) 15 MG tablet TAKE ONE TABLET BY MOUTH ONCE DAILY (NEED AN APPOINTMENT FOR MORE REFILLS)  30 tablet  0  . omeprazole (PRILOSEC) 40 MG capsule Take 1 capsule (40 mg total) by mouth daily.  90 capsule  3  . amitriptyline (ELAVIL) 150 MG tablet Take 0.5 tablets (75 mg total) by mouth at bedtime.  30 tablet  0  . antiseptic oral rinse (BIOTENE) LIQD 15 mLs by Mouth Rinse route as needed for dry mouth.  473 mL  3  . traZODone (DESYREL) 50 MG tablet Take 150 mg by mouth at bedtime.        . traZODone (DESYREL) 50 MG tablet Take 1-2 tablets (50-100 mg total) by mouth at bedtime as needed for sleep.  45 tablet  3  . valACYclovir (VALTREX) 1000 MG tablet Take 1 tablet (1,000 mg total) by mouth 2 (two) times daily.  20 tablet  0  . valACYclovir (VALTREX) 500 MG tablet Take 1 tablet (500 mg total) by mouth 2 (two) times daily. When symptoms are present  18 tablet  0  No current facility-administered medications for this visit.    Objective: BP 148/79  Pulse 80  Temp(Src) 98.2 F (36.8 C) (Oral)  Ht 5\' 6"  (1.676 m)  Wt 188 lb (85.276 kg)  BMI 30.36 kg/m2 Gen: NAD, alert, cooperative with exam HEENT: NCAT, EOMI, PERRL CV: RRR, good S1/S2, no murmur Resp: CTABL, no wheezes, non-labored Abd: SNTND, BS present, no guarding or organomegaly Ext: No edema, warm Neuro: Alert and oriented, No gross deficits   Assessment/Plan:  Dry mouth Largely unchanged since discontinuing amitriptyline Discussed with her that after the age of 83 and they were cut back on her anticholinergics With this in on we'll wean the amitriptyline with one  half pill for 2 weeks and then DC Gabapentin may also be the instigator in this situation, however considering other changes in this visit will wait for next visit to change more variables Sent ANA, anti-ro/anti-la, and rheumatoid factor to evaluate for Sjogren's syndrome  DEPRESSION, MAJOR, RECURRENT Slightly worse since stopping amitriptyline, I anticipate we might need to add additional therapy such as Abilify after she can stop the amitriptyline completely. Also discontinue nortriptyline this time, she was only taking nortriptyline but we will use amitriptyline to wean after discussion. Add trazodone for sleep  INSOMNIA NOS Worsened after stopping amitriptyline, would like to get off of amitriptyline after discussion anticholinergics Started trazodone for sleep Followup one month    Orders Placed This Encounter  Procedures  . Sjogren's syndrome antibods(ssa + ssb)  . Rheumatoid factor  . ANA    Meds ordered this encounter  Medications  . amitriptyline (ELAVIL) 150 MG tablet    Sig: Take 0.5 tablets (75 mg total) by mouth at bedtime.    Dispense:  30 tablet    Refill:  0  . traZODone (DESYREL) 50 MG tablet    Sig: Take 1-2 tablets (50-100 mg total) by mouth at bedtime as needed for sleep.    Dispense:  45 tablet    Refill:  3  . antiseptic oral rinse (BIOTENE) LIQD    Sig: 15 mLs by Mouth Rinse route as needed for dry mouth.    Dispense:  473 mL    Refill:  3

## 2013-03-01 NOTE — Assessment & Plan Note (Signed)
Largely unchanged since discontinuing amitriptyline Discussed with her that after the age of 59 and they were cut back on her anticholinergics With this in on we'll wean the amitriptyline with one half pill for 2 weeks and then DC Gabapentin may also be the instigator in this situation, however considering other changes in this visit will wait for next visit to change more variables Sent ANA, anti-ro/anti-la, and rheumatoid factor to evaluate for Sjogren's syndrome

## 2013-03-02 ENCOUNTER — Telehealth: Payer: Self-pay | Admitting: Family Medicine

## 2013-03-02 LAB — ANA: Anti Nuclear Antibody(ANA): NEGATIVE

## 2013-03-02 LAB — SJOGREN'S SYNDROME ANTIBODS(SSA + SSB)
SSA (Ro) (ENA) Antibody, IgG: <1
SSB (La) (ENA) Antibody, IgG: <1

## 2013-03-02 NOTE — Telephone Encounter (Signed)
And to review labs  Labs were investigating possibility of Sjogren's syndrome, ANA/RF/ and anti-ro and la are negative.   She understands.   Laroy Apple, MD [place] Resident, PGY-2 03/02/2013, 5:40 PM

## 2013-03-20 ENCOUNTER — Other Ambulatory Visit: Payer: Self-pay | Admitting: Family Medicine

## 2013-03-25 ENCOUNTER — Emergency Department (HOSPITAL_COMMUNITY)
Admission: EM | Admit: 2013-03-25 | Discharge: 2013-03-25 | Disposition: A | Discharge status: Home / Self Care | Payer: Medicare Other | Attending: Emergency Medicine | Admitting: Emergency Medicine

## 2013-03-25 ENCOUNTER — Encounter (HOSPITAL_COMMUNITY): Payer: Self-pay | Admitting: Emergency Medicine

## 2013-03-25 DIAGNOSIS — Z90710 Acquired absence of both cervix and uterus: Secondary | ICD-10-CM | POA: Diagnosis not present

## 2013-03-25 DIAGNOSIS — Z791 Long term (current) use of non-steroidal anti-inflammatories (NSAID): Secondary | ICD-10-CM | POA: Diagnosis not present

## 2013-03-25 DIAGNOSIS — I10 Essential (primary) hypertension: Secondary | ICD-10-CM | POA: Insufficient documentation

## 2013-03-25 DIAGNOSIS — Z9851 Tubal ligation status: Secondary | ICD-10-CM | POA: Diagnosis not present

## 2013-03-25 DIAGNOSIS — B009 Herpesviral infection, unspecified: Secondary | ICD-10-CM | POA: Diagnosis not present

## 2013-03-25 DIAGNOSIS — Z85118 Personal history of other malignant neoplasm of bronchus and lung: Secondary | ICD-10-CM | POA: Diagnosis not present

## 2013-03-25 DIAGNOSIS — F329 Major depressive disorder, single episode, unspecified: Secondary | ICD-10-CM | POA: Diagnosis not present

## 2013-03-25 DIAGNOSIS — Z79899 Other long term (current) drug therapy: Secondary | ICD-10-CM | POA: Insufficient documentation

## 2013-03-25 DIAGNOSIS — A6000 Herpesviral infection of urogenital system, unspecified: Secondary | ICD-10-CM | POA: Diagnosis not present

## 2013-03-25 DIAGNOSIS — E785 Hyperlipidemia, unspecified: Secondary | ICD-10-CM | POA: Diagnosis not present

## 2013-03-25 DIAGNOSIS — Z8739 Personal history of other diseases of the musculoskeletal system and connective tissue: Secondary | ICD-10-CM | POA: Diagnosis not present

## 2013-03-25 DIAGNOSIS — K219 Gastro-esophageal reflux disease without esophagitis: Secondary | ICD-10-CM | POA: Diagnosis not present

## 2013-03-25 DIAGNOSIS — Z87891 Personal history of nicotine dependence: Secondary | ICD-10-CM | POA: Diagnosis not present

## 2013-03-25 DIAGNOSIS — F3289 Other specified depressive episodes: Secondary | ICD-10-CM | POA: Insufficient documentation

## 2013-03-25 DIAGNOSIS — J45909 Unspecified asthma, uncomplicated: Secondary | ICD-10-CM | POA: Diagnosis not present

## 2013-03-25 DIAGNOSIS — A6009 Herpesviral infection of other urogenital tract: Secondary | ICD-10-CM

## 2013-03-25 MED ORDER — VALACYCLOVIR HCL 1 G PO TABS: 1000.0000 mg | ORAL_TABLET | Freq: Two times a day (BID) | ORAL | Status: DC

## 2013-03-25 MED ORDER — LIDOCAINE (ANORECTAL) 5 % EX CREA: TOPICAL_CREAM | CUTANEOUS | Status: DC

## 2013-03-25 NOTE — ED Provider Notes (Signed)
CSN: [id number]     Arrival date & time 03/25/13  1726 History   First MD Initiated Contact with Patient 03/25/13 1739     Chief Complaint  Patient presents with  . Blister     (Consider location/radiation/quality/duration/timing/severity/associated sxs/prior Treatment) HPI Comments: Complains of increasing pain. Burning when she urinates. In general he is not feeling well. These areas seem to come and go. When first asked if she ever had this before the patient stated no but later stated that she had been placed on Valtrex after having been infected with herpes. She states that she was given an initial dose, and then a maintenance dose, but is now out of the maintenance medication and is having this breakout. The patient has not had any high fevers. When ask if she had been tested for hepatitis C or HIV the patient states she was not sure.   The history is provided by the patient.    Past Medical History  Diagnosis Date  . HTN (hypertension)   . HLD (hyperlipidemia)   . Osteoarthrosis involving more than one site but not generalized   . Depression   . GERD (gastroesophageal reflux disease)   . Cancer     lung  . Asthma    Past Surgical History  Procedure Laterality Date  . Tubal ligation    . Abdominal hysterectomy    . Abdominal surgery      ovarian cyst removal  . Lung cancer surgery    . Bladder surgery  suspension x4    x 4  . Lobectomy Left   . Excision vaginal cyst Left 06/20/2012    Procedure: EXCISION LEFT LABIAL CYST;  Surgeon: Jonnie Kind, MD;  Location: [facility];  Service: Gynecology;  Laterality: Left;   Family History  Problem Relation Age of Onset  . Hypertension Mother   . Kidney disease Mother   . Hypertension Father   . Kidney disease Father   . Heart disease Father   . Cancer Father     leukemia  . Hypertension Sister   . Hypertension Sister   . Diabetes Other   . Heart disease Other     has pace maker   History  Substance Use Topics  .  Smoking status: Former Smoker    Types: Cigarettes    Quit date: 04/07/1995  . Smokeless tobacco: Never Used  . Alcohol Use: No   OB History   Grav Para Term Preterm Abortions TAB SAB Ect Mult Living                 Review of Systems  Constitutional: Negative for activity change.       All ROS Neg except as noted in HPI  HENT: Negative for nosebleeds.   Eyes: Negative for photophobia and discharge.  Respiratory: Negative for cough, shortness of breath and wheezing.   Cardiovascular: Negative for chest pain and palpitations.  Gastrointestinal: Negative for abdominal pain and blood in stool.  Genitourinary: Positive for vaginal pain. Negative for dysuria, frequency, hematuria, vaginal bleeding and vaginal discharge.  Musculoskeletal: Negative for arthralgias, back pain and neck pain.  Skin: Positive for rash.  Neurological: Negative for dizziness, seizures and speech difficulty.  Psychiatric/Behavioral: Negative for hallucinations and confusion.      Allergies  Promethazine hcl  Home Medications   Current Outpatient Rx  Name  Route  Sig  Dispense  Refill  . albuterol (PROAIR HFA) 108 (90 BASE) MCG/ACT inhaler   Inhalation   Inhale  2 puffs into the lungs every 4 (four) hours as needed. for shortness of breath   1 each   2   . amitriptyline (ELAVIL) 150 MG tablet   Oral   Take 0.5 tablets (75 mg total) by mouth at bedtime.   30 tablet   0   . antiseptic oral rinse (BIOTENE) LIQD   Mouth Rinse   15 mLs by Mouth Rinse route as needed for dry mouth.   473 mL   3   . atorvastatin (LIPITOR) 40 MG tablet   Oral   Take 1 tablet (40 mg total) by mouth daily.   30 tablet   11   . Calcium Citrate-Vitamin D 250-200 MG-UNIT TABS   Oral   Take 1 tablet by mouth daily.          . cyclobenzaprine (FLEXERIL) 10 MG tablet   Oral   Take 1 tablet (10 mg total) by mouth at bedtime as needed for muscle spasms.   30 tablet   3   . DULoxetine (CYMBALTA) 60 MG capsule    Oral   Take 1 capsule (60 mg total) by mouth 2 (two) times daily.   60 capsule   5   . gabapentin (NEURONTIN) 300 MG capsule      Take one tab qhs x1 week, then increase to bid x1 week, then tid   90 capsule   5     Sig should read, one cap TID   . hydrochlorothiazide (HYDRODIURIL) 25 MG tablet   Oral   Take 1 tablet (25 mg total) by mouth daily.   90 tablet   3   . Ketoprofen POWD      Please compound into a 20% gel   30 g   2   . Lidocaine, Anorectal, 5 % CREA      Please apply to the affected area 4 times daily.   30 g   0   . meloxicam (MOBIC) 15 MG tablet      TAKE ONE TABLET BY MOUTH ONCE DAILY MUST  HAVE  APPOINTMENT  FOR  ADDITIONAL  REFILLS   30 tablet   0   . omeprazole (PRILOSEC) 40 MG capsule   Oral   Take 1 capsule (40 mg total) by mouth daily.   90 capsule   3   . EXPIRED: traZODone (DESYREL) 50 MG tablet   Oral   Take 150 mg by mouth at bedtime.           . traZODone (DESYREL) 50 MG tablet   Oral   Take 1-2 tablets (50-100 mg total) by mouth at bedtime as needed for sleep.   45 tablet   3   . valACYclovir (VALTREX) 1000 MG tablet   Oral   Take 1 tablet (1,000 mg total) by mouth 2 (two) times daily.   20 tablet   0   . valACYclovir (VALTREX) 1000 MG tablet   Oral   Take 1 tablet (1,000 mg total) by mouth 2 (two) times daily.   20 tablet   0   . valACYclovir (VALTREX) 500 MG tablet   Oral   Take 1 tablet (500 mg total) by mouth 2 (two) times daily. When symptoms are present   18 tablet   0    BP 150/79  Pulse 98  Temp(Src) 98.7 F (37.1 C)  Resp 18  SpO2 100% Physical Exam  Nursing note and vitals reviewed. Constitutional: She is oriented to person, place, and time.  She appears well-developed and well-nourished.  Non-toxic appearance.  HENT:  Head: Normocephalic.  Right Ear: Tympanic membrane and external ear normal.  Left Ear: Tympanic membrane and external ear normal.  Eyes: EOM and lids are normal. Pupils are  equal, round, and reactive to light.  Neck: Normal range of motion. Neck supple. Carotid bruit is not present.  Cardiovascular: Normal rate, regular rhythm, normal heart sounds, intact distal pulses and normal pulses.   Pulmonary/Chest: Breath sounds normal. No respiratory distress.  Abdominal: Soft. Bowel sounds are normal. There is no tenderness. There is no guarding.  Genitourinary:  Chaperone present during examination. Patient has multiple denuded blisters involving the right and left inner buttocks area. They do not involve the anal area. None seen in the perineum. There no red streaks or evidence of secondary infection. The multiple blister areas are very sore to any touch or movement.   Musculoskeletal: Normal range of motion.  Lymphadenopathy:       Head (right side): No submandibular adenopathy present.       Head (left side): No submandibular adenopathy present.    She has no cervical adenopathy.  Neurological: She is alert and oriented to person, place, and time. She has normal strength. No cranial nerve deficit or sensory deficit.  Skin: Skin is warm and dry.  Psychiatric: She has a normal mood and affect. Her speech is normal.    ED Course  Procedures (including critical care time) Labs Review Labs Reviewed - No data to display Imaging Review No results found.   EKG Interpretation None      MDM Patient has what appears to be a flareup of her herpes. I have discussed this with the patient in terms which he understands. No evidence for secondary infection. No red streaks. No periwound drainage. The plan at this time is for the patient to be placed on Valtrex 1000 mg twice a day and oral lidocaine a normal rectal cranium 5%. Patient advised to see her primary physician this week for additional evaluation and management. Patient also encouraged to check with her physician to make sure she has been tested for other illnesses such as HIV, hepatitis C, etc.    Final diagnoses:   Herpes simplex of female genitalia    *I have reviewed nursing notes, vital signs, and all appropriate lab and imaging results for this patient.Lenox Ahr, PA-C 03/26/13 [phone]

## 2013-03-25 NOTE — Discharge Instructions (Signed)
Please apply the lidocaine cream 3 or 4 times daily. Please use a glove when putting this on to prevent fingers from becoming anesthetized. Please use Valtrex 2 times daily for the next 10 days. Please see Dr. Wendi Snipes for maintenance medication and additional workup. Genital Herpes Genital herpes is a sexually transmitted disease. This means that it is a disease passed by having sex with an infected person. There is no cure for genital herpes. The time between attacks can be months to years. The virus may live in a person but produce no problems (symptoms). This infection can be passed to a baby as it travels down the birth canal (vagina). In a newborn, this can cause central nervous system damage, eye damage, or even death. The virus that causes genital herpes is usually HSV-2 virus. The virus that causes oral herpes is usually HSV-1. The diagnosis (learning what is wrong) is made through culture results. SYMPTOMS  Usually symptoms of pain and itching begin a few days to a week after contact. It first appears as small blisters that progress to small painful ulcers which then scab over and heal after several days. It affects the outer genitalia, birth canal, cervix, penis, anal area, buttocks, and thighs. HOME CARE INSTRUCTIONS   Keep ulcerated areas dry and clean.  Take medications as directed. Antiviral medications can speed up healing. They will not prevent recurrences or cure this infection. These medications can also be taken for suppression if there are frequent recurrences.  While the infection is active, it is contagious. Avoid all sexual contact during active infections.  Condoms may help prevent spread of the herpes virus.  Practice safe sex.  Wash your hands thoroughly after touching the genital area.  Avoid touching your eyes after touching your genital area.  Inform your caregiver if you have had genital herpes and become pregnant. It is your responsibility to insure a safe outcome  for your baby in this pregnancy.  Only take over-the-counter or prescription medicines for pain, discomfort, or fever as directed by your caregiver. SEEK MEDICAL CARE IF:   You have a recurrence of this infection.  You do not respond to medications and are not improving.  You have new sources of pain or discharge which have changed from the original infection.  You have an oral temperature above 102 F (38.9 C).  You develop abdominal pain.  You develop eye pain or signs of eye infection. Document Released: 01/02/2000 Document Revised: 03/29/2011 Document Reviewed: 01/22/2009 [facility] Patient Information 2014 [place], [place].

## 2013-03-25 NOTE — ED Notes (Signed)
Pt c/o "blisters" to buttock area x1 month. Pt reports pain in area.

## 2013-03-27 ENCOUNTER — Encounter: Payer: Self-pay | Admitting: Family Medicine

## 2013-03-27 ENCOUNTER — Ambulatory Visit (INDEPENDENT_AMBULATORY_CARE_PROVIDER_SITE_OTHER): Payer: Medicare Other | Admitting: Family Medicine

## 2013-03-27 VITALS — BP 148/83 | HR 86 | Ht 66.0 in | Wt 186.0 lb

## 2013-03-27 DIAGNOSIS — F339 Major depressive disorder, recurrent, unspecified: Secondary | ICD-10-CM | POA: Diagnosis not present

## 2013-03-27 DIAGNOSIS — G4700 Insomnia, unspecified: Secondary | ICD-10-CM | POA: Diagnosis not present

## 2013-03-27 DIAGNOSIS — A6000 Herpesviral infection of urogenital system, unspecified: Secondary | ICD-10-CM | POA: Diagnosis not present

## 2013-03-27 MED ORDER — AMITRIPTYLINE HCL 150 MG PO TABS: 150.0000 mg | ORAL_TABLET | Freq: Every day | ORAL | Status: DC

## 2013-03-27 MED ORDER — VALACYCLOVIR HCL 500 MG PO TABS: 500.0000 mg | ORAL_TABLET | Freq: Every day | ORAL | Status: DC

## 2013-03-27 NOTE — Progress Notes (Signed)
Patient ID: [name], female   DOB: 08-07-1960, 53 y.o.   MRN: [id number]  Kenn File, MD Phone: [phone]  Subjective:  Chief complaint-noted  # Followup for insomnia, depression, genital herpes  Herpes Patient was seen in the ER tonight ago for herpes outbreak she was given rectal lidocaine cream and Valtrex 1 g twice a day. She would like to start suppressive therapy, she's had 2 major outbreaks in the last year  Impression/insomnia Not tolerating trazodone well, complains of GI upset. Would like to restart amitriptyline despite the dry mouth and dry eyes. States that her moods been slightly worse when she is trying to titrate down amitriptyline and start nortriptyline. She started amitriptyline about one week ago and feels her mood is improved slightly. Her friend complains of irritability and moodiness.   ROS- No fever, chills, sweats No dyspnea A chest pain Otherwise per history of present illness  Past Medical History Patient Active Problem List   Diagnosis Date Noted  . Palpitations 02/15/2013  . Dry mouth 02/15/2013  . Obesity, unspecified 02/15/2013  . Genital herpes 10/12/2012  . HSV (herpes simplex virus) anogenital infection 09/29/2012  . Sebaceous cyst of labia,left 06/16/2012  . UNSPECIFIED SLEEP APNEA 12/04/2008  . ADENOCARCINOMA, LUNG 10/09/2008  . OSTEOARTHROSIS, MULTIPLE SITES, NOT GNRLZD 07/13/2006  . HYPERCHOLESTEROLEMIA 03/17/2006  . DEPRESSION, MAJOR, RECURRENT 03/17/2006  . HYPERTENSION, BENIGN SYSTEMIC 03/17/2006  . GASTROESOPHAGEAL REFLUX, NO ESOPHAGITIS 03/17/2006  . INSOMNIA NOS 03/17/2006    Medications- reviewed and updated Current Outpatient Prescriptions  Medication Sig Dispense Refill  . amitriptyline (ELAVIL) 150 MG tablet Take 1 tablet (150 mg total) by mouth at bedtime.  30 tablet  0  . antiseptic oral rinse (BIOTENE) LIQD 15 mLs by Mouth Rinse route as needed for dry mouth.  473 mL  3  . atorvastatin (LIPITOR) 40 MG tablet  Take 1 tablet (40 mg total) by mouth daily.  30 tablet  11  . Calcium Citrate-Vitamin D 250-200 MG-UNIT TABS Take 1 tablet by mouth daily.       . cyclobenzaprine (FLEXERIL) 10 MG tablet Take 1 tablet (10 mg total) by mouth at bedtime as needed for muscle spasms.  30 tablet  3  . DULoxetine (CYMBALTA) 60 MG capsule Take 1 capsule (60 mg total) by mouth 2 (two) times daily.  60 capsule  5  . gabapentin (NEURONTIN) 300 MG capsule Take one tab qhs x1 week, then increase to bid x1 week, then tid  90 capsule  5  . hydrochlorothiazide (HYDRODIURIL) 25 MG tablet Take 1 tablet (25 mg total) by mouth daily.  90 tablet  3  . Lidocaine, Anorectal, 5 % CREA Please apply to the affected area 4 times daily.  30 g  0  . meloxicam (MOBIC) 15 MG tablet TAKE ONE TABLET BY MOUTH ONCE DAILY MUST  HAVE  APPOINTMENT  FOR  ADDITIONAL  REFILLS  30 tablet  0  . omeprazole (PRILOSEC) 40 MG capsule Take 1 capsule (40 mg total) by mouth daily.  90 capsule  3  . albuterol (PROAIR HFA) 108 (90 BASE) MCG/ACT inhaler Inhale 2 puffs into the lungs every 4 (four) hours as needed. for shortness of breath  1 each  2  . Ketoprofen POWD Please compound into a 20% gel  30 g  2  . traZODone (DESYREL) 50 MG tablet Take 150 mg by mouth at bedtime.        . valACYclovir (VALTREX) 500 MG tablet Take 1 tablet (500 mg  total) by mouth daily.  30 tablet  5   No current facility-administered medications for this visit.    Objective: BP 148/83  Pulse 86  Ht 5\' 6"  (1.676 m)  Wt 186 lb (84.369 kg)  BMI 30.04 kg/m2 Gen: NAD, alert, cooperative with exam HEENT: NCAT, MMM CV: RRR, good S1/S2, no murmur Resp: CTABL, no wheezes, non-labored Neuro: Alert and oriented, No gross deficits   Assessment/Plan:  No problem-specific assessment & plan notes found for this encounter.   No orders of the defined types were placed in this encounter.    Meds ordered this encounter  Medications  . amitriptyline (ELAVIL) 150 MG tablet    Sig: Take  1 tablet (150 mg total) by mouth at bedtime.    Dispense:  30 tablet    Refill:  0  . valACYclovir (VALTREX) 500 MG tablet    Sig: Take 1 tablet (500 mg total) by mouth daily.    Dispense:  30 tablet    Refill:  5

## 2013-03-27 NOTE — Assessment & Plan Note (Signed)
Improved with amitriptyline, did not tolerate trazodone DC trazodone, refilled amitriptyline 150 mg qHS

## 2013-03-27 NOTE — Assessment & Plan Note (Signed)
Second outbreak in 3 months. Patient requests going on low-dose Valtrex for outbreak prophylaxis, I agree and prescribed 500 mg daily Valtrex She is currently finishing a course of 1 g twice a day prescribed by the ER, she'll finish this first. She's also finding good relief with lidocaine rectal cream

## 2013-03-27 NOTE — Patient Instructions (Signed)
It was great to see you today!  Start valtrex 500 mg daily for suppressive therapy after you finish 1000 mg twice daily for 7 days.  Start amitriptyline 150 mg every night for sleep and depression  Followup 2-3 months

## 2013-03-27 NOTE — Assessment & Plan Note (Signed)
Slightly improved since last visit after starting amitriptyline back She is willing to tolerate the anticholinergic side effects her sleep and mood improvement Discussed close followup if her mood does not return to baseline Also finding benefit from Cymbalta. Followup in 3 months

## 2013-04-02 NOTE — ED Provider Notes (Signed)
Medical screening examination/treatment/procedure(s) were performed by non-physician practitioner and as supervising physician I was immediately available for consultation/collaboration.   Leota Jacobsen, MD 04/02/13 [phone]

## 2013-04-13 ENCOUNTER — Other Ambulatory Visit: Payer: Self-pay | Admitting: Family Medicine

## 2013-04-23 ENCOUNTER — Other Ambulatory Visit: Payer: Self-pay | Admitting: Family Medicine

## 2013-05-13 ENCOUNTER — Other Ambulatory Visit: Payer: Self-pay | Admitting: Family Medicine

## 2013-06-12 ENCOUNTER — Other Ambulatory Visit: Payer: Self-pay | Admitting: Family Medicine

## 2013-06-13 NOTE — Telephone Encounter (Signed)
Covering for Dr Wendi Snipes today.  Per his last refill of mobic 05/13/13, he had stated no additional refills until patient is seen in the office. I do not see any future appts listed. Will refuse. Please call to ask pt to make f/u appt with Dr Wendi Snipes.  Shawna Sinclair, MD PGY-2, [place]

## 2013-06-14 NOTE — Telephone Encounter (Signed)
Left message for patient to return call. Please read message below.[name] Linc Renne

## 2013-06-15 ENCOUNTER — Other Ambulatory Visit: Payer: Self-pay | Admitting: *Deleted

## 2013-06-15 MED ORDER — MELOXICAM 15 MG PO TABS: 15.0000 mg | ORAL_TABLET | Freq: Every day | ORAL | Status: DC

## 2013-06-25 ENCOUNTER — Telehealth: Payer: Self-pay | Admitting: Family Medicine

## 2013-06-25 MED ORDER — ACYCLOVIR 400 MG PO TABS: 400.0000 mg | ORAL_TABLET | Freq: Two times a day (BID) | ORAL | Status: DC

## 2013-06-25 NOTE — Telephone Encounter (Signed)
Pt called because the Valtrex she is using is making her nausea and constipated. She wanted to know was there something else she can take. jw

## 2013-06-25 NOTE — Telephone Encounter (Signed)
Rx changed to Acyclovir. Rx sent.

## 2013-06-25 NOTE — Telephone Encounter (Signed)
Forward to PCP for review.[name] [name]

## 2013-07-14 ENCOUNTER — Other Ambulatory Visit: Payer: Self-pay | Admitting: Family Medicine

## 2013-07-16 ENCOUNTER — Other Ambulatory Visit: Payer: Self-pay | Admitting: Family Medicine

## 2013-08-05 ENCOUNTER — Encounter (HOSPITAL_COMMUNITY): Payer: Self-pay | Admitting: Emergency Medicine

## 2013-08-05 ENCOUNTER — Emergency Department (HOSPITAL_COMMUNITY)
Admission: EM | Admit: 2013-08-05 | Discharge: 2013-08-05 | Disposition: A | Discharge status: Home / Self Care | Payer: Medicare Other | Attending: Emergency Medicine | Admitting: Emergency Medicine

## 2013-08-05 ENCOUNTER — Emergency Department (HOSPITAL_COMMUNITY): Payer: Medicare Other

## 2013-08-05 DIAGNOSIS — R638 Other symptoms and signs concerning food and fluid intake: Secondary | ICD-10-CM | POA: Diagnosis not present

## 2013-08-05 DIAGNOSIS — Z90710 Acquired absence of both cervix and uterus: Secondary | ICD-10-CM | POA: Insufficient documentation

## 2013-08-05 DIAGNOSIS — K219 Gastro-esophageal reflux disease without esophagitis: Secondary | ICD-10-CM | POA: Diagnosis not present

## 2013-08-05 DIAGNOSIS — Z9851 Tubal ligation status: Secondary | ICD-10-CM | POA: Diagnosis not present

## 2013-08-05 DIAGNOSIS — F3289 Other specified depressive episodes: Secondary | ICD-10-CM | POA: Diagnosis not present

## 2013-08-05 DIAGNOSIS — Z9889 Other specified postprocedural states: Secondary | ICD-10-CM | POA: Diagnosis not present

## 2013-08-05 DIAGNOSIS — Z85118 Personal history of other malignant neoplasm of bronchus and lung: Secondary | ICD-10-CM | POA: Insufficient documentation

## 2013-08-05 DIAGNOSIS — F329 Major depressive disorder, single episode, unspecified: Secondary | ICD-10-CM | POA: Insufficient documentation

## 2013-08-05 DIAGNOSIS — I10 Essential (primary) hypertension: Secondary | ICD-10-CM | POA: Insufficient documentation

## 2013-08-05 DIAGNOSIS — Z87891 Personal history of nicotine dependence: Secondary | ICD-10-CM | POA: Diagnosis not present

## 2013-08-05 DIAGNOSIS — M159 Polyosteoarthritis, unspecified: Secondary | ICD-10-CM | POA: Insufficient documentation

## 2013-08-05 DIAGNOSIS — R109 Unspecified abdominal pain: Secondary | ICD-10-CM | POA: Diagnosis not present

## 2013-08-05 DIAGNOSIS — E785 Hyperlipidemia, unspecified: Secondary | ICD-10-CM | POA: Insufficient documentation

## 2013-08-05 DIAGNOSIS — Z791 Long term (current) use of non-steroidal anti-inflammatories (NSAID): Secondary | ICD-10-CM | POA: Insufficient documentation

## 2013-08-05 DIAGNOSIS — J45909 Unspecified asthma, uncomplicated: Secondary | ICD-10-CM | POA: Insufficient documentation

## 2013-08-05 DIAGNOSIS — R110 Nausea: Secondary | ICD-10-CM | POA: Diagnosis not present

## 2013-08-05 DIAGNOSIS — Z79899 Other long term (current) drug therapy: Secondary | ICD-10-CM | POA: Insufficient documentation

## 2013-08-05 DIAGNOSIS — K7689 Other specified diseases of liver: Secondary | ICD-10-CM | POA: Diagnosis not present

## 2013-08-05 LAB — LIPASE, BLOOD: Lipase: 17 U/L (ref 11–59)

## 2013-08-05 LAB — CBC WITH DIFFERENTIAL/PLATELET
Basophils Absolute: 0 10*3/uL (ref 0.0–0.1)
Basophils Relative: 1 % (ref 0–1)
EOS ABS: 0.2 10*3/uL (ref 0.0–0.7)
EOS PCT: 5 % (ref 0–5)
HCT: 39.2 % (ref 36.0–46.0)
Hemoglobin: 13.5 g/dL (ref 12.0–15.0)
LYMPHS ABS: 1.8 10*3/uL (ref 0.7–4.0)
Lymphocytes Relative: 40 % (ref 12–46)
MCH: 32.1 pg (ref 26.0–34.0)
MCHC: 34.4 g/dL (ref 30.0–36.0)
MCV: 93.3 fL (ref 78.0–100.0)
MONOS PCT: 7 % (ref 3–12)
Monocytes Absolute: 0.3 10*3/uL (ref 0.1–1.0)
NEUTROS PCT: 47 % (ref 43–77)
Neutro Abs: 2.2 10*3/uL (ref 1.7–7.7)
PLATELETS: 242 10*3/uL (ref 150–400)
RBC: 4.2 MIL/uL (ref 3.87–5.11)
RDW: 12.3 % (ref 11.5–15.5)
WBC: 4.6 10*3/uL (ref 4.0–10.5)

## 2013-08-05 LAB — URINALYSIS, ROUTINE W REFLEX MICROSCOPIC
Bilirubin Urine: NEGATIVE
Glucose, UA: NEGATIVE mg/dL
Ketones, ur: NEGATIVE mg/dL
NITRITE: NEGATIVE
PROTEIN: NEGATIVE mg/dL
Urobilinogen, UA: 0.2 mg/dL (ref 0.0–1.0)
pH: 7 (ref 5.0–8.0)

## 2013-08-05 LAB — COMPREHENSIVE METABOLIC PANEL
ALBUMIN: 4.1 g/dL (ref 3.5–5.2)
ALT: 24 U/L (ref 0–35)
AST: 17 U/L (ref 0–37)
Alkaline Phosphatase: 184 U/L — ABNORMAL HIGH (ref 39–117)
Anion gap: 11 (ref 5–15)
BUN: 7 mg/dL (ref 6–23)
CALCIUM: 9.2 mg/dL (ref 8.4–10.5)
CO2: 31 mEq/L (ref 19–32)
Chloride: 97 mEq/L (ref 96–112)
Creatinine, Ser: 0.8 mg/dL (ref 0.50–1.10)
GFR calc non Af Amer: 85 mL/min — ABNORMAL LOW (ref >90)
GLUCOSE: 100 mg/dL — AB (ref 70–99)
Potassium: 3.5 mEq/L — ABNORMAL LOW (ref 3.7–5.3)
SODIUM: 139 meq/L (ref 137–147)
Total Bilirubin: 0.3 mg/dL (ref 0.3–1.2)
Total Protein: 7.9 g/dL (ref 6.0–8.3)

## 2013-08-05 LAB — URINE MICROSCOPIC-ADD ON

## 2013-08-05 MED ORDER — IOHEXOL 300 MG/ML  SOLN
100.0000 mL | Freq: Once | INTRAMUSCULAR | Status: AC | PRN
  Administered 2013-08-05: 100 mL via INTRAVENOUS

## 2013-08-05 MED ORDER — SODIUM CHLORIDE 0.9 % IV SOLN
1000.0000 mL | Freq: Once | INTRAVENOUS | Status: AC
  Administered 2013-08-05: 1000 mL via INTRAVENOUS

## 2013-08-05 MED ORDER — OXYCODONE-ACETAMINOPHEN 5-325 MG PO TABS: 1.0000 | ORAL_TABLET | ORAL | Status: DC | PRN

## 2013-08-05 MED ORDER — IOHEXOL 300 MG/ML  SOLN
50.0000 mL | Freq: Once | INTRAMUSCULAR | Status: AC | PRN
  Administered 2013-08-05: 50 mL via ORAL

## 2013-08-05 MED ORDER — ONDANSETRON HCL 4 MG/2ML IJ SOLN
4.0000 mg | Freq: Once | INTRAMUSCULAR | Status: AC
  Administered 2013-08-05: 4 mg via INTRAVENOUS
  Filled 2013-08-05: qty 2

## 2013-08-05 MED ORDER — HYDROMORPHONE HCL PF 1 MG/ML IJ SOLN
1.0000 mg | Freq: Once | INTRAMUSCULAR | Status: AC
  Administered 2013-08-05: 1 mg via INTRAVENOUS
  Filled 2013-08-05: qty 1

## 2013-08-05 NOTE — Discharge Instructions (Signed)
Flank Pain Flank pain is pain in your side. The flank is the area of your side between your upper belly (abdomen) and your back. Pain in this area can be caused by many different things. [place] care and treatment will depend on the cause of your pain.  Rest as told by your doctor.  Drink enough fluids to keep your pee (urine) clear or pale yellow.  Only take medicine as told by your doctor.  Tell your doctor about any changes in your pain.  Follow up with your doctor. GET HELP RIGHT AWAY IF:   Your pain does not get better with medicine.   You have new symptoms or your symptoms get worse.  Your pain gets worse.   You have belly (abdominal) pain.   You are short of breath.   You always feel sick to your stomach (nauseous).   You keep throwing up (vomiting).   You have puffiness (swelling) in your belly.   You feel lightheaded or you pass out (faint).   You have blood in your pee.  You have a fever or lasting symptoms for more than 2-3 days.  You have a fever and your symptoms suddenly get worse. MAKE SURE YOU:   Understand these instructions.  Will watch your condition.  Will get help right away if you are not doing well or get worse. Document Released: 10/14/2007 Document Revised: 09/29/2011 Document Reviewed: 08/19/2011 [facility] Patient Information 2015 [place], [place]. This information is not intended to replace advice given to you by your health care provider. Make sure you discuss any questions you have with your health care provider.

## 2013-08-05 NOTE — ED Provider Notes (Signed)
CSN: [id number]     Arrival date & time 08/05/13  6160 History  This chart was scribed for Shawna Pollack, MD by Martinique Peace, ED Scribe. The patient was seen in [place]. The patient's care was started at 9:35 AM.    Chief Complaint  Patient presents with  . Flank Pain     Patient is a 53 y.o. female presenting with flank pain. The history is provided by the patient. No language interpreter was used.  Flank Pain Associated symptoms include abdominal pain.   HPI Comments: LOYS Trevino is a 53 y.o. female who presents to the Emergency Department complaining of sharp flank pain onset this morning that is exacerbated by movement and coughing with associated appetite change and nausea. Pt states nothing helps alleviate the pain. She states that due to her lack of appetite, she has to make herself something like a cracker so that she is able to take her medicine.. She further reports that she has not felt well over the past 3 weeks and has been experiencing constant abdominal pain. She reports history of surgery regarding cyst removal and problems with her bladder. She denies any abnormal vaginal discharge, urinary problems, vomiting, smoking or drinking activity.  Pt states history of hypertension, high cholesterol and depression.   Past Medical History  Diagnosis Date  . HTN (hypertension)   . HLD (hyperlipidemia)   . Osteoarthrosis involving more than one site but not generalized   . Depression   . GERD (gastroesophageal reflux disease)   . Cancer     lung  . Asthma    Past Surgical History  Procedure Laterality Date  . Tubal ligation    . Abdominal hysterectomy    . Abdominal surgery      ovarian cyst removal  . Lung cancer surgery    . Bladder surgery  suspension x4    x 4  . Lobectomy Left   . Excision vaginal cyst Left 06/20/2012    Procedure: EXCISION LEFT LABIAL CYST;  Surgeon: Jonnie Kind, MD;  Location: [facility];  Service: Gynecology;  Laterality: Left;   Family  History  Problem Relation Age of Onset  . Hypertension Mother   . Kidney disease Mother   . Hypertension Father   . Kidney disease Father   . Heart disease Father   . Cancer Father     leukemia  . Hypertension Sister   . Hypertension Sister   . Diabetes Other   . Heart disease Other     has pace maker   History  Substance Use Topics  . Smoking status: Former Smoker    Types: Cigarettes    Quit date: 04/07/1995  . Smokeless tobacco: Never Used  . Alcohol Use: No   OB History   Grav Para Term Preterm Abortions TAB SAB Ect Mult Living                 Review of Systems  Constitutional: Positive for appetite change.  Gastrointestinal: Positive for nausea and abdominal pain. Negative for vomiting.  Genitourinary: Positive for flank pain. Negative for dysuria, vaginal bleeding, vaginal discharge and difficulty urinating.      Allergies  Promethazine hcl  Home Medications   Prior to Admission medications   Medication Sig Start Date End Date Taking? Authorizing Provider  acyclovir (ZOVIRAX) 400 MG tablet Take 1 tablet (400 mg total) by mouth 2 (two) times daily. 06/25/13   Coral Spikes, DO  albuterol (PROAIR HFA) 108 (90 BASE)  MCG/ACT inhaler Inhale 2 puffs into the lungs every 4 (four) hours as needed. for shortness of breath 11/22/12   Timmothy Euler, MD  amitriptyline (ELAVIL) 150 MG tablet TAKE ONE TABLET BY MOUTH ONCE DAILY AT BEDTIME    Timmothy Euler, MD  antiseptic oral rinse (BIOTENE) LIQD 15 mLs by Mouth Rinse route as needed for dry mouth. 03/01/13   Timmothy Euler, MD  atorvastatin (LIPITOR) 40 MG tablet Take 1 tablet (40 mg total) by mouth daily. 02/15/13   Timmothy Euler, MD  Calcium Citrate-Vitamin D 250-200 MG-UNIT TABS Take 1 tablet by mouth daily.     Historical Provider, MD  cyclobenzaprine (FLEXERIL) 10 MG tablet Take 1 tablet (10 mg total) by mouth at bedtime as needed for muscle spasms. 04/18/12   Luetta Nutting, DO  DULoxetine (CYMBALTA) 60 MG  capsule TAKE ONE CAPSULE BY MOUTH TWICE DAILY    Timmothy Euler, MD  gabapentin (NEURONTIN) 300 MG capsule Take one tab qhs x1 week, then increase to bid x1 week, then tid 01/10/13   Timmothy Euler, MD  hydrochlorothiazide (HYDRODIURIL) 25 MG tablet Take 1 tablet (25 mg total) by mouth daily. 02/12/13   Timmothy Euler, MD  Ketoprofen POWD Please compound into a 20% gel 02/14/12   Luetta Nutting, DO  Lidocaine, Anorectal, 5 % CREA Please apply to the affected area 4 times daily. 03/25/13   Lenox Ahr, PA-C  meloxicam (MOBIC) 15 MG tablet Take 1 tablet (15 mg total) by mouth daily.    Timmothy Euler, MD  omeprazole (PRILOSEC) 40 MG capsule Take 1 capsule (40 mg total) by mouth daily. 12/13/12   Timmothy Euler, MD  traZODone (DESYREL) 50 MG tablet Take 150 mg by mouth at bedtime.   10/14/10 11/13/10  Luetta Nutting, DO  valACYclovir (VALTREX) 500 MG tablet Take 1 tablet (500 mg total) by mouth daily. 03/27/13   Timmothy Euler, MD   BP 106/69  Pulse 82  Temp(Src) 98.2 F (36.8 C) (Oral)  Resp 16  Ht 5\' 6"  (1.676 m)  Wt 180 lb (81.647 kg)  BMI 29.07 kg/m2  SpO2 99% Physical Exam  Nursing note and vitals reviewed. Constitutional: She is oriented to person, place, and time. She appears well-developed and well-nourished.  HENT:  Head: Normocephalic and atraumatic.  Eyes: Conjunctivae and EOM are normal.  Neck: Neck supple. No tracheal deviation present.  Cardiovascular: Normal rate and regular rhythm.   Pulmonary/Chest: Effort normal. No respiratory distress.  Musculoskeletal: Normal range of motion.  Neurological: She is alert and oriented to person, place, and time.  Skin: Skin is warm and dry.  Psychiatric: She has a normal mood and affect. Her behavior is normal.    ED Course  Procedures (including critical care time) DIAGNOSTIC STUDIES: Oxygen Saturation is 99% on room air, normal by my interpretation.    COORDINATION OF CARE: 9:41 AM- Treatment plan was discussed  with patient who verbalizes understanding and agrees.   11:45 AM- Tests came back negative. Results and further plans of treatment are discussed with Pt who verbalizes understanding and agrees.    Labs Review Labs Reviewed  COMPREHENSIVE METABOLIC PANEL - Abnormal; Notable for the following:    Potassium 3.5 (*)    Glucose, Bld 100 (*)    Alkaline Phosphatase 184 (*)    GFR calc non Af Amer 85 (*)    All other components within normal limits  URINALYSIS, ROUTINE W REFLEX MICROSCOPIC - Abnormal; Notable for  the following:    Color, Urine STRAW (*)    APPearance HAZY (*)    Specific Gravity, Urine <1.005 (*)    Hgb urine dipstick SMALL (*)    Leukocytes, UA SMALL (*)    All other components within normal limits  URINE MICROSCOPIC-ADD ON - Abnormal; Notable for the following:    Squamous Epithelial / LPF FEW (*)    All other components within normal limits  CBC WITH DIFFERENTIAL  LIPASE, BLOOD    Imaging Review Ct Abdomen Pelvis W Contrast  08/05/2013   CLINICAL DATA:  Right-sided abdominal pain. History of lung carcinoma.  EXAM: CT ABDOMEN AND PELVIS WITH CONTRAST  TECHNIQUE: Multidetector CT imaging of the abdomen and pelvis was performed using the standard protocol following bolus administration of intravenous contrast.  CONTRAST:  88mL OMNIPAQUE IOHEXOL 300 MG/ML SOLN, 135mL OMNIPAQUE IOHEXOL 300 MG/ML SOLN  COMPARISON:  06/15/2008  FINDINGS: There is mild increased stool throughout the colon. No colonic wall thickening or inflammatory changes. Normal small bowel. Normal appendix.  Minor subsegmental atelectasis at the lung bases. Heart is normal in size.  Mild fatty infiltration of the liver. Liver is otherwise unremarkable.  Normal spleen, gallbladder pancreas. No bile duct dilation. No adrenal masses. Normal kidneys, ureters and bladder.  Uterus is surgically absent.  No pelvic masses.  No adenopathy.  No abnormal fluid collections.  Mild degenerative changes of the visualized spine.   IMPRESSION: 1. No acute findings. 2. Mild increased stool in the colon. 3. Status post hysterectomy. 4. Mild hepatic steatosis.   Electronically Signed   By: Lajean Manes M.D.   On: 08/05/2013 11:38     EKG Interpretation None     Medications - No data to display  MDM   53 y.o. Female with two weeks of abdominal discomfort now with sharp right flank pain- work up reveals no evidence of appendicitis, kidney stone, colitis, sbo,cholecystitis, kidney infection.  Discussed results with patient and family.  Return precautions discussed. Follow up if not better 12-24 hours- patient voices understanding.   I personally performed the services described in this documentation, which was scribed in my presence. The recorded information has been reviewed and considered.     Shawna Pollack, MD 08/06/13 1040

## 2013-08-05 NOTE — ED Notes (Signed)
Pt states she hasn't felt well x 3 weeks with no appetite and nausea. Pt states abdominal pain and swelling also. States pain to right flank/side area began this morning, described as sharp. Hurts worse with movement and coughing.

## 2013-08-05 NOTE — ED Notes (Signed)
MD at bedside. 

## 2013-08-27 ENCOUNTER — Other Ambulatory Visit: Payer: Self-pay | Admitting: *Deleted

## 2013-09-13 ENCOUNTER — Other Ambulatory Visit: Payer: Self-pay | Admitting: *Deleted

## 2013-09-13 MED ORDER — CYCLOBENZAPRINE HCL 10 MG PO TABS: 10.0000 mg | ORAL_TABLET | Freq: Every evening | ORAL | Status: DC | PRN

## 2013-09-14 ENCOUNTER — Other Ambulatory Visit: Payer: Self-pay | Admitting: Family Medicine

## 2013-09-14 MED ORDER — ACYCLOVIR 400 MG PO TABS: 400.0000 mg | ORAL_TABLET | Freq: Two times a day (BID) | ORAL | Status: DC

## 2013-09-14 MED ORDER — HYDROCHLOROTHIAZIDE 25 MG PO TABS: 25.0000 mg | ORAL_TABLET | Freq: Every day | ORAL | Status: DC

## 2013-09-14 NOTE — Telephone Encounter (Signed)
Notified that pt needs acyclovir and HCTZ sent in. Refills given. Acyclovir changed from valtrex given nasuea and GI intolerance.   This is the suppression dose.   Laroy Apple, MD [place] Resident, PGY-3 09/14/2013, 11:20 AM

## 2013-09-15 ENCOUNTER — Other Ambulatory Visit: Payer: Self-pay | Admitting: Family Medicine

## 2013-09-17 ENCOUNTER — Other Ambulatory Visit: Payer: Self-pay | Admitting: *Deleted

## 2013-10-03 ENCOUNTER — Encounter: Payer: Self-pay | Admitting: Family Medicine

## 2013-10-03 ENCOUNTER — Other Ambulatory Visit (HOSPITAL_COMMUNITY)
Admission: RE | Admit: 2013-10-03 | Discharge: 2013-10-03 | Disposition: A | Discharge status: Home / Self Care | Payer: Medicare Other | Source: Ambulatory Visit | Attending: Family Medicine | Admitting: Family Medicine

## 2013-10-03 ENCOUNTER — Ambulatory Visit (INDEPENDENT_AMBULATORY_CARE_PROVIDER_SITE_OTHER): Payer: Medicare Other | Admitting: Family Medicine

## 2013-10-03 VITALS — BP 157/74 | HR 82 | Temp 98.4°F | Wt 185.0 lb

## 2013-10-03 DIAGNOSIS — A5901 Trichomonal vulvovaginitis: Secondary | ICD-10-CM

## 2013-10-03 DIAGNOSIS — M159 Polyosteoarthritis, unspecified: Secondary | ICD-10-CM

## 2013-10-03 DIAGNOSIS — Z113 Encounter for screening for infections with a predominantly sexual mode of transmission: Secondary | ICD-10-CM | POA: Insufficient documentation

## 2013-10-03 DIAGNOSIS — R109 Unspecified abdominal pain: Secondary | ICD-10-CM | POA: Diagnosis not present

## 2013-10-03 LAB — COMPREHENSIVE METABOLIC PANEL
ALT: 13 U/L (ref 0–35)
AST: 13 U/L (ref 0–37)
Albumin: 4.5 g/dL (ref 3.5–5.2)
Alkaline Phosphatase: 135 U/L — ABNORMAL HIGH (ref 39–117)
BILIRUBIN TOTAL: 0.5 mg/dL (ref 0.2–1.2)
BUN: 9 mg/dL (ref 6–23)
CO2: 34 meq/L — AB (ref 19–32)
Calcium: 9.6 mg/dL (ref 8.4–10.5)
Chloride: 100 mEq/L (ref 96–112)
Creat: 0.88 mg/dL (ref 0.50–1.10)
GLUCOSE: 91 mg/dL (ref 70–99)
Potassium: 3.3 mEq/L — ABNORMAL LOW (ref 3.5–5.3)
Sodium: 140 mEq/L (ref 135–145)
TOTAL PROTEIN: 7.2 g/dL (ref 6.0–8.3)

## 2013-10-03 LAB — CBC WITH DIFFERENTIAL/PLATELET
Basophils Absolute: 0 10*3/uL (ref 0.0–0.1)
Basophils Relative: 0 % (ref 0–1)
EOS PCT: 3 % (ref 0–5)
Eosinophils Absolute: 0.2 10*3/uL (ref 0.0–0.7)
HCT: 37.2 % (ref 36.0–46.0)
HEMOGLOBIN: 12.9 g/dL (ref 12.0–15.0)
LYMPHS ABS: 3.1 10*3/uL (ref 0.7–4.0)
LYMPHS PCT: 42 % (ref 12–46)
MCH: 31.3 pg (ref 26.0–34.0)
MCHC: 34.7 g/dL (ref 30.0–36.0)
MCV: 90.3 fL (ref 78.0–100.0)
MONOS PCT: 7 % (ref 3–12)
Monocytes Absolute: 0.5 10*3/uL (ref 0.1–1.0)
Neutro Abs: 3.5 10*3/uL (ref 1.7–7.7)
Neutrophils Relative %: 48 % (ref 43–77)
PLATELETS: 275 10*3/uL (ref 150–400)
RBC: 4.12 MIL/uL (ref 3.87–5.11)
RDW: 12.9 % (ref 11.5–15.5)
WBC: 7.3 10*3/uL (ref 4.0–10.5)

## 2013-10-03 LAB — POCT H PYLORI SCREEN: H Pylori Screen, POC: NEGATIVE

## 2013-10-03 MED ORDER — IBUPROFEN 600 MG PO TABS
600.0000 mg | ORAL_TABLET | Freq: Three times a day (TID) | ORAL | Status: DC | PRN
Start: 2013-10-03 — End: 2013-10-17

## 2013-10-03 MED ORDER — METRONIDAZOLE 500 MG PO TABS: 500.0000 mg | ORAL_TABLET | Freq: Two times a day (BID) | ORAL | Status: DC

## 2013-10-03 NOTE — Assessment & Plan Note (Addendum)
Patient doing well on MOBIC, Cymbalta Will stop MOBIC due to concurrent abdominal pain States that she does not tolerate Tylenol, also does not think tramadol helps, on a limited to narcotics for this. Will continue ibuprofen, patient agrees to stop NSAID use for 1 week to see if pain will improve.

## 2013-10-03 NOTE — Patient Instructions (Signed)
Great to see you!  PLease come back if your symptoms are not better in 2 weeks, I will follow up on your labs.    Trichomoniasis Trichomoniasis is an infection caused by an organism called Trichomonas. The infection can affect both women and men. In women, the outer female genitalia and the vagina are affected. In men, the penis is mainly affected, but the prostate and other reproductive organs can also be involved. Trichomoniasis is a sexually transmitted infection (STI) and is most often passed to another person through sexual contact.  RISK FACTORS  Having unprotected sexual intercourse.  Having sexual intercourse with an infected partner. SIGNS AND SYMPTOMS  Symptoms of trichomoniasis in women include:  Abnormal gray-green frothy vaginal discharge.  Itching and irritation of the vagina.  Itching and irritation of the area outside the vagina. Symptoms of trichomoniasis in men include:   Penile discharge with or without pain.  Pain during urination. This results from inflammation of the urethra. DIAGNOSIS  Trichomoniasis may be found during a Pap test or physical exam. Your health care provider may use one of the following methods to help diagnose this infection:  Examining vaginal discharge under a microscope. For men, urethral discharge would be examined.  Testing the pH of the vagina with a test tape.  Using a vaginal swab test that checks for the Trichomonas organism. A test is available that provides results within a few minutes.  Doing a culture test for the organism. This is not usually needed. TREATMENT   You may be given medicine to fight the infection. Women should inform their health care provider if they could be or are pregnant. Some medicines used to treat the infection should not be taken during pregnancy.  Your health care provider may recommend over-the-counter medicines or creams to decrease itching or irritation.  Your sexual partner will need to be  treated if infected. HOME CARE INSTRUCTIONS   Take medicines only as directed by your health care provider.  Take over-the-counter medicine for itching or irritation as directed by your health care provider.  Do not have sexual intercourse while you have the infection.  Women should not douche or wear tampons while they have the infection.  Discuss your infection with your partner. Your partner may have gotten the infection from you, or you may have gotten it from your partner.  Have your sex partner get examined and treated if necessary.  Practice safe, informed, and protected sex.  See your health care provider for other STI testing. SEEK MEDICAL CARE IF:   You still have symptoms after you finish your medicine.  You develop abdominal pain.  You have pain when you urinate.  You have bleeding after sexual intercourse.  You develop a rash.  Your medicine makes you sick or makes you throw up (vomit). MAKE SURE YOU:  Understand these instructions.  Will watch your condition.  Will get help right away if you are not doing well or get worse. Document Released: 06/30/2000 Document Revised: 05/21/2013 Document Reviewed: 10/16/2012 [facility] Patient Information 2015 [place], [place]. This information is not intended to replace advice given to you by your health care provider. Make sure you discuss any questions you have with your health care provider.

## 2013-10-03 NOTE — Assessment & Plan Note (Addendum)
Microscopic urinalysis exam in the ER shows Trichomonas present Starting symptoms vaginal discharge and itching will treat with Flagyl. Followup if not improved in 2 weeks Also checking urine GCC

## 2013-10-03 NOTE — Progress Notes (Signed)
Patient ID: [name], female   DOB: [birth year], 53 y.o.   MRN: [id number]   HPI  Patient presents today for followup abdominal pain  Patient states that this all began 2 months ago when she was evaluated in the ED. In the ED a CT abdomen, CBC, CMP, lipase were all normal.  She states that she has diffuse abdominal pain that hurts all the time but is worse after eating. She describes it as an achy pain. It is not alleviated by any medications. She also notes intermittent emesis and nausea after eating. She states that approximately twice per week she has some blood-tinged vomitus.  She also mentions that she had a prior history of left lower lobe lung cancer which was surgically removed  She also mentions mild intermittent hematochezia and separate events of blood on her toilet paper.  Smoking status noted ROS: Per HPI  Objective: BP 157/74  Pulse 82  Temp(Src) 98.4 F (36.9 C) (Oral)  Wt 185 lb (83.915 kg) Gen: NAD, alert, cooperative with exam HEENT: NCAT, bilateral nares within normal limits, MMM CV: RRR, good S1/S2, no murmur Resp: CTABL, no wheezes, non-labored Abd: Soft, tenderness to palpation throughout, distractible-no pain with palpation of left lower quadrant when patient is distracted however this is the area of described most intense pain, no Murphy sign Ext: No edema, warm Neuro: Alert and oriented, No gross deficits  Assessment and plan:  Abdominal pain, unspecified site Abdominal pain for 2 months, benign exam except for tenderness to palpation throughout ED evaluation included CBC, CMP, lipase, UA which were all relatively normal CT abdomen in the ED was also negative for acute findings Considering that there is Trichomonas in her urine I am concerned that maybe she has a GCC infection, this is also difficult to evaluate given that she has a hysterectomy in am not sure if there is a cervix present or not. Regardless we'll evaluate with urine GCC Send to GI ,  She's due for screening colonoscopy already, with intermittent blood-tinged vomitus would also consider EGD.  Follow up for pelvic if not resolved in 2 weeks Blood-tinged vomitus most consistent with Mallory-Weiss tears Also check H. pylori, stopped MOBIC the patient still wants ibuprofen (will continue given lower GI side effects)  Trichomonas vaginalis (TV) infection Microscopic urinalysis exam in the ER shows Trichomonas present Starting symptoms vaginal discharge and itching will treat with Flagyl. Followup if not improved in 2 weeks Also checking urine GCC   OSTEOARTHROSIS, MULTIPLE SITES, NOT GNRLZD Patient doing well on MOBIC, Cymbalta Will stop MOBIC due to concurrent abdominal pain States that she does not tolerate Tylenol, also does not think tramadol helps, on a limited to narcotics for this. Will continue ibuprofen, patient agrees to stop NSAID use for 1 week to see if pain will improve.     Orders Placed This Encounter  Procedures  . Comprehensive metabolic panel  . CBC with Differential  . Ambulatory referral to Gastroenterology    Referral Priority:  Routine    Referral Type:  Consultation    Referral Reason:  Specialty Services Required    Requested Specialty:  Gastroenterology    Number of Visits Requested:  1  . H. pylori Screen    Meds ordered this encounter  Medications  . metroNIDAZOLE (FLAGYL) 500 MG tablet    Sig: Take 1 tablet (500 mg total) by mouth 2 (two) times daily.    Dispense:  14 tablet    Refill:  0

## 2013-10-03 NOTE — Assessment & Plan Note (Addendum)
Abdominal pain for 2 months, benign exam except for tenderness to palpation throughout ED evaluation included CBC, CMP, lipase, UA which were all relatively normal CT abdomen in the ED was also negative for acute findings Considering that there is Trichomonas in her urine I am concerned that maybe she has a GCC infection, this is also difficult to evaluate given that she has a hysterectomy in am not sure if there is a cervix present or not. Regardless we'll evaluate with urine GCC Send to GI , She's due for screening colonoscopy already, with intermittent blood-tinged vomitus would also consider EGD.  Follow up for pelvic if not resolved in 2 weeks Blood-tinged vomitus most consistent with Mallory-Weiss tears Also check H. pylori, stopped MOBIC the patient still wants ibuprofen (will continue given lower GI side effects)

## 2013-10-04 ENCOUNTER — Encounter: Payer: Self-pay | Admitting: Internal Medicine

## 2013-10-05 ENCOUNTER — Telehealth: Payer: Self-pay | Admitting: Family Medicine

## 2013-10-05 NOTE — Telephone Encounter (Signed)
Called to discuss labs and abd discomfort.   She began her medicine for Trich today. She denies any change in her symptoms.   She had mild hypokalemia, I encouraged potassium rich foods like potatoes, beans, and nuts.   She is wondering about when her GI appt is which I told her about.   She will call back if her symptoms have not improved.   Laroy Apple, MD [place] Resident, PGY-3 10/05/2013, 4:10 PM

## 2013-10-16 ENCOUNTER — Telehealth: Payer: Self-pay | Admitting: Family Medicine

## 2013-10-16 ENCOUNTER — Other Ambulatory Visit: Payer: Self-pay | Admitting: Family Medicine

## 2013-10-16 NOTE — Telephone Encounter (Signed)
Pt states that she had to start taking Meloxicam again due to her pain. FYI

## 2013-10-17 MED ORDER — IBUPROFEN 600 MG PO TABS: 600.0000 mg | ORAL_TABLET | Freq: Three times a day (TID) | ORAL | Status: DC | PRN

## 2013-10-17 NOTE — Telephone Encounter (Signed)
Refilled chronic meds. Denied mobic as I would rather her have less exposure to NSAIDs, sent Rx for ibuprofen as GI side effects are lowest for it.   Will ask staff to inform.   Laroy Apple, MD [place] Resident, PGY-3 10/17/2013, 7:56 AM

## 2013-10-17 NOTE — Telephone Encounter (Signed)
Pt informed. [place], [place] CMA 

## 2013-10-29 ENCOUNTER — Other Ambulatory Visit: Payer: Self-pay | Admitting: Family Medicine

## 2013-10-29 NOTE — Telephone Encounter (Signed)
Rx refilled for amitriptyline, covering for Dr. Wendi Snipes. F/u with PCP for further refills. --CMS

## 2013-10-31 ENCOUNTER — Emergency Department (HOSPITAL_COMMUNITY)
Admission: EM | Admit: 2013-10-31 | Discharge: 2013-10-31 | Disposition: A | Discharge status: Home / Self Care | Payer: Medicare Other | Attending: Emergency Medicine | Admitting: Emergency Medicine

## 2013-10-31 ENCOUNTER — Emergency Department (HOSPITAL_COMMUNITY): Payer: Medicare Other

## 2013-10-31 ENCOUNTER — Encounter (HOSPITAL_COMMUNITY): Payer: Self-pay | Admitting: Emergency Medicine

## 2013-10-31 DIAGNOSIS — R002 Palpitations: Secondary | ICD-10-CM

## 2013-10-31 DIAGNOSIS — B349 Viral infection, unspecified: Secondary | ICD-10-CM | POA: Diagnosis not present

## 2013-10-31 DIAGNOSIS — Z8739 Personal history of other diseases of the musculoskeletal system and connective tissue: Secondary | ICD-10-CM | POA: Diagnosis not present

## 2013-10-31 DIAGNOSIS — K219 Gastro-esophageal reflux disease without esophagitis: Secondary | ICD-10-CM | POA: Diagnosis not present

## 2013-10-31 DIAGNOSIS — J45909 Unspecified asthma, uncomplicated: Secondary | ICD-10-CM | POA: Insufficient documentation

## 2013-10-31 DIAGNOSIS — Z79899 Other long term (current) drug therapy: Secondary | ICD-10-CM | POA: Insufficient documentation

## 2013-10-31 DIAGNOSIS — G8929 Other chronic pain: Secondary | ICD-10-CM | POA: Insufficient documentation

## 2013-10-31 DIAGNOSIS — R110 Nausea: Secondary | ICD-10-CM

## 2013-10-31 DIAGNOSIS — Z87891 Personal history of nicotine dependence: Secondary | ICD-10-CM | POA: Insufficient documentation

## 2013-10-31 DIAGNOSIS — E785 Hyperlipidemia, unspecified: Secondary | ICD-10-CM | POA: Insufficient documentation

## 2013-10-31 DIAGNOSIS — Z85118 Personal history of other malignant neoplasm of bronchus and lung: Secondary | ICD-10-CM | POA: Insufficient documentation

## 2013-10-31 DIAGNOSIS — R079 Chest pain, unspecified: Secondary | ICD-10-CM | POA: Diagnosis not present

## 2013-10-31 DIAGNOSIS — E876 Hypokalemia: Secondary | ICD-10-CM

## 2013-10-31 DIAGNOSIS — R05 Cough: Secondary | ICD-10-CM | POA: Diagnosis not present

## 2013-10-31 DIAGNOSIS — I10 Essential (primary) hypertension: Secondary | ICD-10-CM | POA: Diagnosis not present

## 2013-10-31 DIAGNOSIS — F329 Major depressive disorder, single episode, unspecified: Secondary | ICD-10-CM | POA: Diagnosis not present

## 2013-10-31 DIAGNOSIS — R0602 Shortness of breath: Secondary | ICD-10-CM | POA: Diagnosis not present

## 2013-10-31 HISTORY — DX: Unspecified abdominal pain: R10.9

## 2013-10-31 HISTORY — DX: Pain in unspecified joint: M25.50

## 2013-10-31 HISTORY — DX: Dorsalgia, unspecified: M54.9

## 2013-10-31 HISTORY — DX: Palpitations: R00.2

## 2013-10-31 HISTORY — DX: Other chronic pain: G89.29

## 2013-10-31 LAB — COMPREHENSIVE METABOLIC PANEL
ALK PHOS: 164 U/L — AB (ref 39–117)
ALT: 14 U/L (ref 0–35)
AST: 12 U/L (ref 0–37)
Albumin: 3.7 g/dL (ref 3.5–5.2)
Anion gap: 12 (ref 5–15)
BUN: 6 mg/dL (ref 6–23)
CALCIUM: 9.2 mg/dL (ref 8.4–10.5)
CO2: 31 mEq/L (ref 19–32)
Chloride: 99 mEq/L (ref 96–112)
Creatinine, Ser: 0.87 mg/dL (ref 0.50–1.10)
GFR calc Af Amer: 89 mL/min — ABNORMAL LOW (ref >90)
GFR calc non Af Amer: 76 mL/min — ABNORMAL LOW (ref >90)
GLUCOSE: 120 mg/dL — AB (ref 70–99)
POTASSIUM: 3.2 meq/L — AB (ref 3.7–5.3)
SODIUM: 142 meq/L (ref 137–147)
TOTAL PROTEIN: 7.7 g/dL (ref 6.0–8.3)
Total Bilirubin: 0.3 mg/dL (ref 0.3–1.2)

## 2013-10-31 LAB — TROPONIN I

## 2013-10-31 LAB — CBC WITH DIFFERENTIAL/PLATELET
BASOS PCT: 0 % (ref 0–1)
Basophils Absolute: 0 10*3/uL (ref 0.0–0.1)
EOS ABS: 0.1 10*3/uL (ref 0.0–0.7)
EOS PCT: 2 % (ref 0–5)
HCT: 38.1 % (ref 36.0–46.0)
HEMOGLOBIN: 13 g/dL (ref 12.0–15.0)
LYMPHS ABS: 2.2 10*3/uL (ref 0.7–4.0)
Lymphocytes Relative: 41 % (ref 12–46)
MCH: 32 pg (ref 26.0–34.0)
MCHC: 34.1 g/dL (ref 30.0–36.0)
MCV: 93.8 fL (ref 78.0–100.0)
MONO ABS: 0.2 10*3/uL (ref 0.1–1.0)
MONOS PCT: 4 % (ref 3–12)
Neutro Abs: 2.8 10*3/uL (ref 1.7–7.7)
Neutrophils Relative %: 53 % (ref 43–77)
Platelets: 297 10*3/uL (ref 150–400)
RBC: 4.06 MIL/uL (ref 3.87–5.11)
RDW: 12.2 % (ref 11.5–15.5)
WBC: 5.3 10*3/uL (ref 4.0–10.5)

## 2013-10-31 LAB — LIPASE, BLOOD: Lipase: 17 U/L (ref 11–59)

## 2013-10-31 MED ORDER — POTASSIUM CHLORIDE 20 MEQ/15ML (10%) PO LIQD
40.0000 meq | Freq: Once | ORAL | Status: AC
  Administered 2013-10-31: 40 meq via ORAL
  Filled 2013-10-31: qty 30

## 2013-10-31 MED ORDER — ALBUTEROL SULFATE HFA 108 (90 BASE) MCG/ACT IN AERS: 2.0000 | INHALATION_SPRAY | RESPIRATORY_TRACT | Status: DC | PRN

## 2013-10-31 MED ORDER — ONDANSETRON HCL 4 MG PO TABS: 4.0000 mg | ORAL_TABLET | Freq: Three times a day (TID) | ORAL | Status: DC | PRN

## 2013-10-31 MED ORDER — IPRATROPIUM-ALBUTEROL 0.5-2.5 (3) MG/3ML IN SOLN
3.0000 mL | Freq: Once | RESPIRATORY_TRACT | Status: AC
  Administered 2013-10-31: 3 mL via RESPIRATORY_TRACT
  Filled 2013-10-31: qty 3

## 2013-10-31 NOTE — ED Notes (Signed)
Pt c/o cough, fatigue, and SOB since SUnday.  Reports history of asthma.  Reports has not felt like eating for the past few weeks.  Says feels achy all over.  Also reports has been having palpitations and gets dizzy.

## 2013-10-31 NOTE — ED Notes (Signed)
RT paged for breathing tx

## 2013-10-31 NOTE — ED Provider Notes (Signed)
CSN: [id number]     Arrival date & time 10/31/13  1006 History   First MD Initiated Contact with Patient 10/31/13 1016     Chief Complaint  Patient presents with  . Cough     HPI Pt was seen at 1020.  Per pt, c/o gradual onset and persistence of constant runny/stuffy nose, sinus congestion, and cough for the past 3-4 days. Has been associated with generalized body aches/fatigue and decreased appetite. Pt describes her symptoms as "I think I have the flu." Pt also c/o acute flair of her chronic intermittent palpitations and N/V for the past 10+ months. Pt states she has f/u with GI MD next week for her chronic GI symptoms and regularly f/u with her PMD regarding her other chronic symptoms.  Denies fevers, no rash, no CP/SOB, no diarrhea, no abd pain, no black or blood in stools or emesis, no sore throat.   GI: Marylee Floras Past Medical History  Diagnosis Date  . HTN (hypertension)   . HLD (hyperlipidemia)   . Osteoarthrosis involving more than one site but not generalized   . Depression   . GERD (gastroesophageal reflux disease)   . Asthma   . Chronic abdominal pain   . Chronic joint pain   . Chronic back pain   . Cancer 1997    left lung  . Palpitations 01/2013    chronic, intermittent  . Nausea and vomiting     chronic, recurrent   Past Surgical History  Procedure Laterality Date  . Tubal ligation    . Abdominal hysterectomy    . Abdominal surgery      ovarian cyst removal  . Lung cancer surgery    . Bladder surgery  suspension x4    x 4  . Lobectomy Left   . Excision vaginal cyst Left 06/20/2012    Procedure: EXCISION LEFT LABIAL CYST;  Surgeon: Jonnie Kind, MD;  Location: [facility];  Service: Gynecology;  Laterality: Left;   Family History  Problem Relation Age of Onset  . Hypertension Mother   . Kidney disease Mother   . Hypertension Father   . Kidney disease Father   . Heart disease Father   . Cancer Father     leukemia  . Hypertension Sister   .  Hypertension Sister   . Diabetes Other   . Heart disease Other     has pace maker   History  Substance Use Topics  . Smoking status: Former Smoker    Types: Cigarettes    Quit date: 04/07/1995  . Smokeless tobacco: Never Used  . Alcohol Use: No    Review of Systems ROS: Statement: All systems negative except as marked or noted in the HPI; Constitutional: Negative for fever and chills. +generalized body aches/fatigue.; ; Eyes: Negative for eye pain, redness and discharge. ; ; ENMT: Negative for ear pain, hoarseness, sore throat. +nasal congestion, sinus pressure and rhinorrhea. ; ; Cardiovascular: +chronic palpitations. Negative for chest pain, diaphoresis, dyspnea and peripheral edema. ; ; Respiratory: +cough. Negative for wheezing and stridor. ; ; Gastrointestinal: +chronic N/V, decreased appetite. Negative for diarrhea, abdominal pain, blood in stool, hematemesis, jaundice and rectal bleeding. . ; ; Genitourinary: Negative for dysuria, flank pain and hematuria. ; ; Musculoskeletal: Negative for back pain and neck pain. Negative for swelling and trauma.; ; Skin: Negative for pruritus, rash, abrasions, blisters, bruising and skin lesion.; ; Neuro: Negative for headache, lightheadedness and neck stiffness. Negative for weakness, altered level of consciousness , altered  mental status, extremity weakness, paresthesias, involuntary movement, seizure and syncope.      Allergies  Promethazine hcl  Home Medications   Prior to Admission medications   Medication Sig Start Date End Date Taking? Authorizing Provider  acyclovir (ZOVIRAX) 400 MG tablet Take 1 tablet (400 mg total) by mouth 2 (two) times daily. 09/14/13  Yes Timmothy Euler, MD  albuterol (PROAIR HFA) 108 (90 BASE) MCG/ACT inhaler Inhale 2 puffs into the lungs every 4 (four) hours as needed. for shortness of breath 11/22/12  Yes Timmothy Euler, MD  amitriptyline (ELAVIL) 150 MG tablet TAKE ONE TABLET BY MOUTH ONCE DAILY AT BEDTIME  10/29/13  Yes Sharon [address], MD  atorvastatin (LIPITOR) 40 MG tablet Take 1 tablet (40 mg total) by mouth daily. 02/15/13  Yes Timmothy Euler, MD  Calcium Citrate-Vitamin D 250-200 MG-UNIT TABS Take 1 tablet by mouth daily.    Yes Historical Provider, MD  DULoxetine (CYMBALTA) 60 MG capsule Take 1 capsule (60 mg total) by mouth 2 (two) times daily. 10/17/13  Yes Timmothy Euler, MD  gabapentin (NEURONTIN) 300 MG capsule Take 1 capsule (300 mg total) by mouth 3 (three) times daily. 10/17/13  Yes Timmothy Euler, MD  hydrochlorothiazide (HYDRODIURIL) 25 MG tablet Take 1 tablet (25 mg total) by mouth daily. 09/14/13  Yes Timmothy Euler, MD  ibuprofen (ADVIL,MOTRIN) 600 MG tablet Take 1 tablet (600 mg total) by mouth every 8 (eight) hours as needed. Do not take with mobic 10/17/13  Yes Timmothy Euler, MD  mineral oil enema Place 1 enema rectally once as needed for mild constipation or severe constipation.   Yes Historical Provider, MD  omeprazole (PRILOSEC) 40 MG capsule Take 1 capsule (40 mg total) by mouth daily. 12/13/12  Yes Timmothy Euler, MD  traZODone (DESYREL) 50 MG tablet Take 150 mg by mouth at bedtime.   10/14/10  Yes Cody Matthews, DO   BP 114/56  Pulse 80  Temp(Src) 98.1 F (36.7 C) (Oral)  Resp 12  Ht 5\' 6"  (1.676 m)  Wt 182 lb (82.555 kg)  BMI 29.39 kg/m2  SpO2 98% Physical Exam 1025: Physical examination:  Nursing notes reviewed; Vital signs and O2 SAT reviewed;  Constitutional: Well developed, Well nourished, Well hydrated, In no acute distress; Head:  Normocephalic, atraumatic; Eyes: EOMI, PERRL, No scleral icterus; ENMT: TM's clear bilat. +edemetous nasal turbinates bilat with clear rhinorrhea. Mouth and pharynx normal, Mucous membranes moist; Neck: Supple, Full range of motion, No lymphadenopathy; Cardiovascular: Regular rate and rhythm, No murmur, rub, or gallop; Respiratory: Breath sounds coarse & equal bilaterally, scattered exp wheezes. No audible wheezing.  Speaking full sentences with ease, Normal respiratory effort/excursion; Chest: Nontender, Movement normal; Abdomen: Soft, Nontender, Nondistended, Normal bowel sounds; Genitourinary: No CVA tenderness; Extremities: Pulses normal, No tenderness, No edema, No calf edema or asymmetry.; Neuro: AA&Ox3, Major CN grossly intact.  Speech clear. No gross focal motor or sensory deficits in extremities.; Skin: Color normal, Warm, Dry.   ED Course  Procedures     EKG Interpretation   Date/Time:  Wednesday October 31 2013 10:29:38 EDT Ventricular Rate:  87 PR Interval:  156 QRS Duration: 78 QT Interval:  367 QTC Calculation: 441 R Axis:   -15 Text Interpretation:  Sinus rhythm Borderline left axis deviation  Borderline repolarization abnormality When compared with ECG of 02/15/2013  No significant change was found Confirmed by [facility]  MD, Lynnita Somma  ([id number]) on 10/31/2013 11:44:01 AM      MDM  MDM Reviewed: previous chart, nursing note and vitals Reviewed previous: labs and ECG Interpretation: labs, ECG and x-ray    Results for orders placed during the hospital encounter of 10/31/13  LIPASE, BLOOD      Result Value Ref Range   Lipase 17  11 - 59 U/L  TROPONIN I      Result Value Ref Range   Troponin I <0.30  <0.30 ng/mL  COMPREHENSIVE METABOLIC PANEL      Result Value Ref Range   Sodium 142  137 - 147 mEq/L   Potassium 3.2 (*) 3.7 - 5.3 mEq/L   Chloride 99  96 - 112 mEq/L   CO2 31  19 - 32 mEq/L   Glucose, Bld 120 (*) 70 - 99 mg/dL   BUN 6  6 - 23 mg/dL   Creatinine, Ser 0.87  0.50 - 1.10 mg/dL   Calcium 9.2  8.4 - 10.5 mg/dL   Total Protein 7.7  6.0 - 8.3 g/dL   Albumin 3.7  3.5 - 5.2 g/dL   AST 12  0 - 37 U/L   ALT 14  0 - 35 U/L   Alkaline Phosphatase 164 (*) 39 - 117 U/L   Total Bilirubin 0.3  0.3 - 1.2 mg/dL   GFR calc non Af Amer 76 (*) >90 mL/min   GFR calc Af Amer 89 (*) >90 mL/min   Anion gap 12  5 - 15  CBC WITH DIFFERENTIAL      Result Value Ref Range   WBC  5.3  4.0 - 10.5 K/uL   RBC 4.06  3.87 - 5.11 MIL/uL   Hemoglobin 13.0  12.0 - 15.0 g/dL   HCT 38.1  36.0 - 46.0 %   MCV 93.8  78.0 - 100.0 fL   MCH 32.0  26.0 - 34.0 pg   MCHC 34.1  30.0 - 36.0 g/dL   RDW 12.2  11.5 - 15.5 %   Platelets 297  150 - 400 K/uL   Neutrophils Relative % 53  43 - 77 %   Neutro Abs 2.8  1.7 - 7.7 K/uL   Lymphocytes Relative 41  12 - 46 %   Lymphs Abs 2.2  0.7 - 4.0 K/uL   Monocytes Relative 4  3 - 12 %   Monocytes Absolute 0.2  0.1 - 1.0 K/uL   Eosinophils Relative 2  0 - 5 %   Eosinophils Absolute 0.1  0.0 - 0.7 K/uL   Basophils Relative 0  0 - 1 %   Basophils Absolute 0.0  0.0 - 0.1 K/uL   Dg Chest 2 View 10/31/2013   CLINICAL DATA:  53 year old female with productive cough x2 weeks, increasing. Acute shortness of Breath and vomiting of phlegm tinged with blood. Central chest pain. Initial encounter. Personal history of lung cancer in 1997, and former smoker.  EXAM: CHEST  2 VIEW  COMPARISON:  Chest CTA 12/02/2008. Chest radiographs 12/02/2008 and earlier.  FINDINGS: Stable staple line along the left mediastinum. Stable lung volumes, mildly decreased on the left with tenting of the left hemidiaphragm. Stable cardiac size and mediastinal contours. Visualized tracheal air column is within normal limits. No pneumothorax or pulmonary edema. No pleural effusion or confluent pulmonary opacity. Negative visible bowel gas pattern. No acute osseous abnormality identified.  IMPRESSION: Stable postoperative appearance of the chest. No acute cardiopulmonary abnormality.   Electronically Signed   By: Lars Pinks M.D.   On: 10/31/2013 11:25    1310:  Workup reassuring. Pt  has tol PO well without N/V. No stooling while in the ED. Abd remains benign, VSS. Pt states she wants to go home now. Will tx symptomatically at this time. Pt strongly encouraged to f/u with GI MD next week for her chronic abd pain and recurrent N/V. Dx and testing d/w pt and family.  Questions answered.  Verb  understanding, agreeable to d/c home with outpt f/u.    Francine Graven, DO 11/02/13 1954

## 2013-10-31 NOTE — Discharge Instructions (Signed)
°Emergency Department Resource Guide °1) Find a Doctor and Pay Out of Pocket °Although you won't have to find out who is covered by your insurance plan, it is a good idea to ask around and get recommendations. You will then need to call the office and see if the doctor you have chosen will accept you as a new patient and what types of options they offer for patients who are self-pay. Some doctors offer discounts or will set up payment plans for their patients who do not have insurance, but you will need to ask so you aren't surprised when you get to your appointment. ° °2) Contact Your Local Health Department °Not all health departments have doctors that can see patients for sick visits, but many do, so it is worth a call to see if yours does. If you don't know where your local health department is, you can check in your phone book. The CDC also has a tool to help you locate your state's health department, and many state websites also have listings of all of their local health departments. ° °3) Find a Walk-in Clinic °If your illness is not likely to be very severe or complicated, you may want to try a walk in clinic. These are popping up all over the country in pharmacies, drugstores, and shopping centers. They're usually staffed by nurse practitioners or physician assistants that have been trained to treat common illnesses and complaints. They're usually fairly quick and inexpensive. However, if you have serious medical issues or chronic medical problems, these are probably not your best option. ° °No Primary Care Doctor: °- Call [facility] at  832-8000 - they can help you locate a primary care doctor that  accepts your insurance, provides certain services, etc. °- Physician Referral Service- 1-800-533-3463 ° °Chronic Pain Problems: °Organization         Address  Phone   Notes  °[facility]  (336) 297-2271 Patients need to be referred by their primary care doctor.  ° °Medication  Assistance: °Organization         Address  Phone   Notes  °[place] [facility] [address] °[place], [place] [address] (336) 641-8030 --Must be a resident of [place] °-- Must have NO insurance coverage whatsoever (no Medicaid/ Medicare, etc.) °-- The pt. MUST have a primary care doctor that directs their care regularly and follows them in the community °  °[facility]  (866) 331-1348   °[employer]  (888) 892-1162   ° °Agencies that provide inexpensive medical care: °Organization         Address  Phone   Notes  °[facility]  (336) 832-8035   °[facility]    (336) 832-7272   °[facility] [address] °[place], [place] [address] (336) 832-4777   °[facility] [address], °[place] (336) 271-4999   °[facility]    (336) 373-0678   °[facility]    (336) 272-1050   °[facility] ° [address], [place] Phone:  (336) 832-4444, Fax:  (336) 832-4440 Hours of Operation:  9 am - 6 pm, M-F.  Also accepts Medicaid/Medicare and self-pay.  °[facility] ° [address], [place] Phone: (336) 832-3150, Fax: (336) [phone]. Hours of Operation:  8:30 am - 5:30 pm, M-F.  Also accepts Medicaid and self-pay.  °[facility] [place] [address], [place] Phone: (336) 878-6027   °[facility] [address], [place], [place] (336)723-1848, Ext. 123 Mondays & Thursdays: 7-9 AM.  First 15 patients are seen on a first come, first serve basis. °  ° °Medicaid-accepting [place] Providers: ° °Organization         Address  Phone   Notes  °[facility] [address], [place] (336) 641-2100 Also accepts self-pay patients.  °[facility] [address], [place] ° (336) 856-9996   °[facility] [address], [place]  (336) 288-8857   °[facility] [address] [place] [address], [place] (336) 299-7000   °Veita Bland [address], [place]  ° (336) 373-1557 Only accepts [place] Access Medicaid patients after they have their name applied to their card.  ° °Self-Pay (no insurance) in [place]: ° °Organization         Address  Phone   Notes  °Sickle Cell Patients, [facility] [address], [place] (336) 832-1970   °[facility] [address], [place] (336) 832-4400   °[facility] [place] ° [address],  (336) 992-4800   °[facility]/Dr. Osei-Bonsu ° [address] [place] [address], [place] or [address], [place] (336) [phone] Phone number for both [place] and [place] locations is the same.  °[facility] [address], [place] (336) 299-0000   °[facility] [address] [place] [address], [place] or [address] (336) 852-7530 °(336) 878-2260   °[facility] [address], [place] (336) 350-1642, phone; (336) 294-5005, fax Sees patients 1st and 3rd Saturday of every month.  Must not qualify for public or private insurance (i.e. Medicaid, Medicare, [place] Health Choice, Veterans' Benefits) • Household income should be no more than 200% of the poverty level •The clinic cannot treat you if you are pregnant or think you are pregnant • Sexually transmitted diseases are not treated at the clinic.  ° ° °Dental Care: °Organization         Address  Phone  Notes  °[place] [facility] [address], [place] (336) 641-6152 Accepts children up to age 21 who are enrolled in Medicaid or [place] Health Choice; pregnant women with a Medicaid card; and children who have applied for Medicaid or [place] Health Choice, but were declined, whose parents can pay a reduced fee at time of service.  °[place]  [facility] [place]  [address], [place] (336) 641-7733 Accepts children up to age 21 who are enrolled in Medicaid or [place] Health Choice; pregnant women with a Medicaid card; and children who have applied for Medicaid or  Health Choice, but were declined, whose parents can pay a reduced fee at time of service.  °[facility] ° [address], [place] (336) 641-4533 Patients are seen by appointment only. Walk-ins are not accepted. [facility] will see patients 18 years of age and older. °Monday - Tuesday (8am-5pm) °Most Wednesdays (8:30-5pm) °$30 per visit, cash only  °[facility] ° [address], [place] (336) 641-4533 Patients are seen by appointment only. Walk-ins are not accepted. [facility] will see patients 18 years of age and older. °One   Wednesday Evening (Monthly: Volunteer Based).  $30 per visit, cash only  °[facility]  (919) [phone] for adults; Children under age 4, call Graduate Pediatric Dentistry at ([phone]) [phone]. Children aged 4-14, please call (919) [phone] to request a pediatric application. ° Dental services are provided in all areas of dental care including fillings, crowns and bridges, complete and partial dentures, implants, gum treatment, root canals, and extractions. Preventive care is also provided. Treatment is provided to both adults and children. °Patients are selected via a lottery and there is often a waiting list. °  °[facility] [address], °[place] ° (336) 763-8833 [email or web address] °  °[facility] [address], [place], [place] (336)723-1848, Ext. 123 Second and Fourth Thursday of each month, opens at 6:30 AM; Clinic ends at 9 AM.  Patients are seen on a first-come first-served basis, and a limited number are seen during each clinic.  ° °[facility] ° [address], [place], [place] (336) [phone]    Eligibility Requirements °You must have lived in [place], [place], or [place] counties for at least the last three months. °  You cannot be eligible for state or federal sponsored healthcare insurance, including Veterans Administration, Medicaid, or Medicare. °  You generally cannot be eligible for healthcare insurance through your employer.  °  How to apply: °Eligibility screenings are held every Tuesday and Wednesday afternoon from 1:00 pm until 4:00 pm. You do not need an appointment for the interview!  °[facility] [address], [place], [place] 336-631-2330   °[facility]  336-342-8273   °[facility]  336-703-3100   °[facility]  336-570-6415   ° °Behavioral Health Resources in the Community: °Intensive Outpatient Programs °Organization         Address  Phone  Notes  °[facility] [address], [place], [place] 336-878-6098   °[facility] [address], [place], [place] 336-832-9800   °[facility] [address], [place], [place] ° 336-882-2125   °[facility] [address],  °[place], [place] 1-800-853-5163 or 336-641-4981   °Substance Abuse Resources °Organization         Address  Phone  Notes  °[facility]  336-882-2125   °[facility]  336-784-9470   °[facility]  336-285-9073   °[facility]  336-845-3988   °Residential & Outpatient Substance Abuse Program  1-800-659-3381   °Psychological Services °Organization         Address  Phone  Notes  °[facility]  336- 832-9600   °[facility]  336- 378-7881   °[facility] [address], [place] 1-800-853-5163 or 336-641-4981   ° °Mobile Crisis Teams °Organization         Address  Phone  Notes  °[facility], Mobile Crisis Care Unit  1-877-626-1772   °Assertive °[facility] ° [address]  [place], [place] 336-834-9664   °Sharon DeEsch [address] °[place] 336-554-5454   ° °Self-Help/Support Groups °Organization         Address  Phone             Notes  °[facility]  - variety of support groups  336- 373-1402 Call for more information  °Narcotics Anonymous (NA), [facility] [address], °[place]   2 meetings at this location  ° °  Residential Treatment Programs Organization         Address  Phone  Notes  [facility] [address]    [place]  1-(623)311-8404   [facility]  [address], [place] [address], [place], [place]   [place], [place] [phone] Admissions: 8am-3pm M-F  [facility] [address]    [place], [place] [phone]   [facility] [address] [place], [place], [place]   [facility] [address],  [place], [place]   [facility] Intensive Outpatient [address], [place], [place]   [facility] [address]  [place], [place] 1-947-765-4086 or 678-313-6392   [facility] [address] [place], [place] Accepts Medicaid  [facility] [place] [facility] [address]  [place] 1-817 518 8100 Substance Abuse/Addiction Treatment   [facility]         Address  Phone  Notes  [facility]  317-383-7602   Domenic Schwab, PhD [address] [place], [place]   906-603-6792 or (807)246-9870   [place], [place] [phone]   [facility] [address], [place], [place] (203)762-3161 Insurance/Medicaid/sponsorship through [facility] and Families [address]                                    [place], [place] [phone] [place] [address], [place] ([phone]    Dr. Adele Schilder  ([phone]) [phone]   [facility] Dept. 1) [address], [place] 2) [place] 3)  [address], [place] [phone]  [phone]   [place] (567)259-7713 or [phone] (After Hours)       Take the prescriptions as directed.  Increase your fluid intake (ie:  Gatoraide) for the next few days.  Eat a bland diet and advance to your regular diet slowly as you can tolerate it.  Use your albuterol inhaler (2 to 4 puffs) every 4 hours for the next 7 days, then as needed for cough, wheezing, or shortness of breath. Call your regular medical doctor today to schedule a follow up appointment this week. Call the GI doctor to confirm your previously schedule appointment for next week. Return to the Emergency Department immediately if not improving (or even worsening) despite taking the medicines as prescribed, any black or bloody stool or vomit, if you develop a fever over "101," or for any other concerns.

## 2013-11-06 ENCOUNTER — Encounter: Payer: Self-pay | Admitting: Gastroenterology

## 2013-11-06 ENCOUNTER — Ambulatory Visit (INDEPENDENT_AMBULATORY_CARE_PROVIDER_SITE_OTHER): Payer: Medicare Other | Admitting: Gastroenterology

## 2013-11-06 ENCOUNTER — Other Ambulatory Visit: Payer: Self-pay

## 2013-11-06 VITALS — BP 133/72 | HR 83 | Temp 97.7°F | Resp 18 | Ht 66.0 in | Wt 183.0 lb

## 2013-11-06 DIAGNOSIS — R748 Abnormal levels of other serum enzymes: Secondary | ICD-10-CM

## 2013-11-06 DIAGNOSIS — R110 Nausea: Secondary | ICD-10-CM

## 2013-11-06 DIAGNOSIS — K5909 Other constipation: Secondary | ICD-10-CM | POA: Diagnosis not present

## 2013-11-06 DIAGNOSIS — R1084 Generalized abdominal pain: Secondary | ICD-10-CM

## 2013-11-06 DIAGNOSIS — K625 Hemorrhage of anus and rectum: Secondary | ICD-10-CM

## 2013-11-06 DIAGNOSIS — K219 Gastro-esophageal reflux disease without esophagitis: Secondary | ICD-10-CM

## 2013-11-06 DIAGNOSIS — R634 Abnormal weight loss: Secondary | ICD-10-CM | POA: Diagnosis not present

## 2013-11-06 LAB — HEPATIC FUNCTION PANEL
ALT: 19 U/L (ref 0–35)
AST: 14 U/L (ref 0–37)
Albumin: 4.3 g/dL (ref 3.5–5.2)
Alkaline Phosphatase: 163 U/L — ABNORMAL HIGH (ref 39–117)
Bilirubin, Direct: 0.1 mg/dL (ref 0.0–0.3)
Indirect Bilirubin: 0.4 mg/dL (ref 0.2–1.2)
TOTAL PROTEIN: 7.3 g/dL (ref 6.0–8.3)
Total Bilirubin: 0.5 mg/dL (ref 0.2–1.2)

## 2013-11-06 MED ORDER — LINACLOTIDE 290 MCG PO CAPS: 290.0000 ug | ORAL_CAPSULE | Freq: Every day | ORAL | Status: DC

## 2013-11-06 MED ORDER — PEG-KCL-NACL-NASULF-NA ASC-C 100 G PO SOLR: 1.0000 | ORAL | Status: DC

## 2013-11-06 NOTE — Patient Instructions (Signed)
1. Please have your labs done. 2. Please start Linzess 237mcg daily on empty stomach for constipation. Voucher will get you 30 day free supply from pharmacy.  3. Colonoscopy and upper endoscopy as scheduled. See separate instructions.  4. If you bowels are not moving regularly by next Monday, please call the office and inform us.

## 2013-11-06 NOTE — Progress Notes (Signed)
cc'ed to pcp °

## 2013-11-06 NOTE — Progress Notes (Signed)
Primary Care Physician:  Kenn File, MD  Primary Gastroenterologist:  Garfield Cornea, MD   Chief Complaint  Patient presents with  . Abdominal Pain    HPI:  [name] is a 53 y.o. female here for further evaluation of abdominal pain at the request of Dr. Kenn File.  Complains of bloating/swelling, abdominal pain diffuse, constipation (chronically). Associated nausea, back pain. Abdominal pain for couple of months. Nothing makes better or worse. Some brbpr. Postprandial nausea. Coughs up blood, she believes is coming from her lungs rather than vomiting. Poor appetite. +solid food dysphagia. No heartburn. Stool softeners. Two fleets enemas last week without results. Laxative gripes. No melena. Weight down 7 pounds this year, unintentional. BM less than once per week. H/O chronic GERD, has to take omeprazole. Remote sigmoidoscopy 1997.  Recent stopped Mobic. Has not started the ibuprofen.   Current Outpatient Prescriptions  Medication Sig Dispense Refill  . acyclovir (ZOVIRAX) 400 MG tablet Take 1 tablet (400 mg total) by mouth 2 (two) times daily.  60 tablet  6  . albuterol (PROAIR HFA) 108 (90 BASE) MCG/ACT inhaler Inhale 2 puffs into the lungs every 4 (four) hours as needed. for shortness of breath  1 each  2  . albuterol (PROVENTIL HFA;VENTOLIN HFA) 108 (90 BASE) MCG/ACT inhaler Inhale 2 puffs into the lungs every 4 (four) hours as needed for wheezing or shortness of breath.  1 Inhaler  0  . amitriptyline (ELAVIL) 150 MG tablet TAKE ONE TABLET BY MOUTH ONCE DAILY AT BEDTIME  30 tablet  1  . atorvastatin (LIPITOR) 40 MG tablet Take 1 tablet (40 mg total) by mouth daily.  30 tablet  11  . Calcium Citrate-Vitamin D 250-200 MG-UNIT TABS Take 1 tablet by mouth daily.       . DULoxetine (CYMBALTA) 60 MG capsule Take 1 capsule (60 mg total) by mouth 2 (two) times daily.  120 capsule  11  . gabapentin (NEURONTIN) 300 MG capsule Take 1 capsule (300 mg total) by mouth 3 (three) times  daily.  90 capsule  11  . hydrochlorothiazide (HYDRODIURIL) 25 MG tablet Take 1 tablet (25 mg total) by mouth daily.  90 tablet  3  . ibuprofen (ADVIL,MOTRIN) 600 MG tablet Take 1 tablet (600 mg total) by mouth every 8 (eight) hours as needed. Do not take with mobic  30 tablet  0  . mineral oil enema Place 1 enema rectally once as needed for mild constipation or severe constipation.      Marland Kitchen omeprazole (PRILOSEC) 40 MG capsule Take 1 capsule (40 mg total) by mouth daily.  90 capsule  3  . ondansetron (ZOFRAN) 4 MG tablet Take 1 tablet (4 mg total) by mouth every 8 (eight) hours as needed for nausea or vomiting.  6 tablet  0  . traZODone (DESYREL) 50 MG tablet Take 150 mg by mouth at bedtime.         No current facility-administered medications for this visit.    Allergies as of 11/06/2013 - Review Complete 11/06/2013  Allergen Reaction Noted  . Promethazine hcl Nausea And Vomiting and Other (See Comments) 07/26/2008    Past Medical History  Diagnosis Date  . HTN (hypertension)   . HLD (hyperlipidemia)   . Osteoarthrosis involving more than one site but not generalized   . Depression   . GERD (gastroesophageal reflux disease)   . Asthma   . Chronic abdominal pain   . Chronic joint pain   . Chronic back pain   .  Cancer 1997    left lung  . Palpitations 01/2013    chronic, intermittent  . Nausea and vomiting     chronic, recurrent    Past Surgical History  Procedure Laterality Date  . Tubal ligation    . Abdominal hysterectomy    . Abdominal surgery      ovarian cyst removal  . Lung cancer surgery  1997    age 59, resection only  . Bladder surgery  suspension x4    x 4  . Lobectomy Left   . Excision vaginal cyst Left 06/20/2012    Procedure: EXCISION LEFT LABIAL CYST;  Surgeon: Jonnie Kind, MD;  Location: [facility];  Service: Gynecology;  Laterality: Left;    Family History  Problem Relation Age of Onset  . Hypertension Mother   . Kidney disease Mother   . Hypertension  Father   . Kidney disease Father   . Heart disease Father   . Cancer Father     leukemia  . Hypertension Sister   . Hypertension Sister   . Diabetes Other   . Heart disease Other     has Psychologist, forensic  . Colon cancer Neg Hx     History   Social History  . Marital Status: Married    Spouse Name: N/A    Number of Children: 3  . Years of Education: N/A   Occupational History  . Not on file.   Social History Main Topics  . Smoking status: Former Smoker    Types: Cigarettes    Quit date: 04/07/1995  . Smokeless tobacco: Never Used  . Alcohol Use: No  . Drug Use: No  . Sexual Activity: Not Currently    Birth Control/ Protection: Surgical   Other Topics Concern  . Not on file   Social History Narrative  . No narrative on file      ROS:  General: No fever. Please see history of present illness.  Eyes: Negative for vision changes.  ENT: Negative for hoarseness,  nasal congestion. CV: Negative for chest pain, angina, palpitations, dyspnea on exertion, peripheral edema.  Respiratory: Negative for dyspnea at rest, dyspnea on exertion, cough, sputum, wheezing.  GI: See history of present illness. GU:  Negative for dysuria, hematuria, urinary incontinence, urinary frequency, nocturnal urination.  MS: Chronic joint/back pain.  Derm: Negative for rash or itching.  Neuro: Negative for weakness, abnormal sensation, seizure, frequent headaches, memory loss, confusion.  Psych: Negative for anxiety, depression, suicidal ideation, hallucinations.  Endo: See history of present illness  Heme: Negative for bruising or bleeding. Allergy: Negative for rash or hives.    Physical Examination:  BP 133/72  Pulse 83  Temp(Src) 97.7 F (36.5 C) (Oral)  Resp 18  Ht '5\' 6"'  (1.676 m)  Wt 183 lb (83.008 kg)  BMI 29.55 kg/m2   General: Well-nourished, well-developed in no acute distress. Accompanied by daughter Head: Normocephalic, atraumatic.   Eyes: Conjunctiva pink, no  icterus. Mouth: Oropharyngeal mucosa moist and pink , no lesions erythema or exudate. Neck: Supple without thyromegaly, masses, or lymphadenopathy.  Lungs: Clear to auscultation bilaterally.  Heart: Regular rate and rhythm, no murmurs rubs or gallops.  Abdomen: Bowel sounds are normal, vague diffuse tenderness, nondistended, no hepatosplenomegaly or masses, no abdominal bruits or    hernia , no rebound or guarding.   Rectal:not performed Extremities: No lower extremity edema. No clubbing or deformities.  Neuro: Alert and oriented x 4 , grossly normal neurologically.  Skin: Warm and dry,  no rash or jaundice.   Psych: Alert and cooperative, normal mood and affect.  Labs: Lab Results  Component Value Date   LIPASE 17 10/31/2013   Lab Results  Component Value Date   CREATININE 0.87 10/31/2013   BUN 6 10/31/2013   NA 142 10/31/2013   K 3.2* 10/31/2013   CL 99 10/31/2013   CO2 31 10/31/2013   Lab Results  Component Value Date   ALT 14 10/31/2013   AST 12 10/31/2013   ALKPHOS 164* 10/31/2013   BILITOT 0.3 10/31/2013   Component     Latest Ref Rng 10/14/2010 06/16/2012 10/12/2012 02/15/2013 08/05/2013  Alkaline Phosphatase     39 - 117 U/L 142 (H)  144 (H)  184 (H)   Component     Latest Ref Rng 10/03/2013 10/31/2013  Alkaline Phosphatase     39 - 117 U/L 135 (H) 164 (H)    Lab Results  Component Value Date   WBC 5.3 10/31/2013   HGB 13.0 10/31/2013   HCT 38.1 10/31/2013   MCV 93.8 10/31/2013   PLT 297 10/31/2013   H.pylori screen, POC: negative ANA negative in 02/2013  Imaging Studies: Dg Chest 2 View  10/31/2013   CLINICAL DATA:  53 year old female with productive cough x2 weeks, increasing. Acute shortness of Breath and vomiting of phlegm tinged with blood. Central chest pain. Initial encounter. Personal history of lung cancer in 1997, and former smoker.  EXAM: CHEST  2 VIEW  COMPARISON:  Chest CTA 12/02/2008. Chest radiographs 12/02/2008 and earlier.  FINDINGS: Stable  staple line along the left mediastinum. Stable lung volumes, mildly decreased on the left with tenting of the left hemidiaphragm. Stable cardiac size and mediastinal contours. Visualized tracheal air column is within normal limits. No pneumothorax or pulmonary edema. No pleural effusion or confluent pulmonary opacity. Negative visible bowel gas pattern. No acute osseous abnormality identified.  IMPRESSION: Stable postoperative appearance of the chest. No acute cardiopulmonary abnormality.   Electronically Signed   By: Lars Pinks M.D.   On: 10/31/2013 11:25   CT A/P with contrast 07/2013 Mild increased stool throughout the colon. Mild fatty liver.   Impression/plan: 53 year old lady with multiple GI complaints. She has chronic constipation, worsened recently and associated with bloating/swelling, diffuse abdominal pain. No prior colonoscopy. Intermittent bright red blood per rectum. Suspect functional constipation with benign anorectal bleeding that she needs colonoscopy for further evaluation. She also has chronic GERD which is well controlled, solid food esophageal dysphagia, anorexia, nausea. Previously on Mobic. Recommend EGD for to evaluate for stricture/ring as well as gastritis/peptic ulcer disease. Given polypharmacy we'll plan on EGD/esophageal dilation, colonoscopy with deep sedation with Dr. Gala Romney. I have discussed the risks, alternatives, benefits with regards to but not limited to the risk of reaction to medication, bleeding, infection, perforation and the patient is agreeable to proceed. Written consent to be obtained.  She has h/o chronically elevated alk phos. Further evaluation planned.   Start Linzess 262mg daily for constipation. If symptoms not improved by next week, then she will call and we can modify bowel regimen.   Check labs including LFTs, AMA/ASMA, AP isoenzymes.

## 2013-11-07 LAB — MITOCHONDRIAL/SMOOTH MUSCLE AB PNL
Mitochondrial M2 Ab, IgG: 0.48 (ref <0.91)
SMOOTH MUSCLE AB: 11 U (ref <20)

## 2013-11-08 ENCOUNTER — Encounter (HOSPITAL_COMMUNITY): Payer: Self-pay | Admitting: Pharmacy Technician

## 2013-11-12 LAB — ALKALINE PHOSPHATASE ISOENZYMES
Alkaline Phonsphatase: 167 U/L — ABNORMAL HIGH (ref 33–130)
Bone Isoenzymes: 13 % — ABNORMAL LOW (ref 28–66)
Intestinal Isoenzymes: 1 % (ref 1–24)
Liver Isoenzymes: 67 % (ref 25–69)
Macrohepatic isoenzymes: 19 % — ABNORMAL HIGH

## 2013-11-15 ENCOUNTER — Encounter (HOSPITAL_COMMUNITY): Payer: Self-pay

## 2013-11-15 ENCOUNTER — Encounter (HOSPITAL_COMMUNITY)
Admission: RE | Admit: 2013-11-15 | Discharge: 2013-11-15 | Disposition: A | Discharge status: Home / Self Care | Payer: Medicare Other | Source: Ambulatory Visit | Attending: Internal Medicine | Admitting: Internal Medicine

## 2013-11-15 NOTE — Pre-Procedure Instructions (Signed)
Pre op telephone call completed, as patient has had all required lab work completed and has been reviewed by Dr Patsey Berthold, who stated that it was not necessary to redraw.  Date and time of 11/5 @ 7:00 reviewed with her, along with the following medications to take am of procedure:  Cymbalta, Gabapentin, Linzess, Prilosec, Zofran and Albuterol inhaler.  She was instructed to bring inhaler with her that am.  Verbalized understanding of above conversation.  Advised her to call [phone] with any questions or changes.

## 2013-11-22 ENCOUNTER — Ambulatory Visit (HOSPITAL_COMMUNITY): Payer: Medicare Other | Admitting: Anesthesiology

## 2013-11-22 ENCOUNTER — Encounter (HOSPITAL_COMMUNITY)
Admission: RE | Disposition: A | Discharge status: Home / Self Care | Payer: Self-pay | Source: Ambulatory Visit | Attending: Internal Medicine

## 2013-11-22 ENCOUNTER — Encounter (HOSPITAL_COMMUNITY): Payer: Self-pay | Admitting: *Deleted

## 2013-11-22 ENCOUNTER — Ambulatory Visit (HOSPITAL_COMMUNITY)
Admission: RE | Admit: 2013-11-22 | Discharge: 2013-11-22 | Disposition: A | Discharge status: Home / Self Care | Payer: Medicare Other | Source: Ambulatory Visit | Attending: Internal Medicine | Admitting: Internal Medicine

## 2013-11-22 DIAGNOSIS — K319 Disease of stomach and duodenum, unspecified: Secondary | ICD-10-CM | POA: Insufficient documentation

## 2013-11-22 DIAGNOSIS — K921 Melena: Secondary | ICD-10-CM | POA: Diagnosis not present

## 2013-11-22 DIAGNOSIS — E785 Hyperlipidemia, unspecified: Secondary | ICD-10-CM | POA: Insufficient documentation

## 2013-11-22 DIAGNOSIS — K222 Esophageal obstruction: Secondary | ICD-10-CM | POA: Diagnosis not present

## 2013-11-22 DIAGNOSIS — J45909 Unspecified asthma, uncomplicated: Secondary | ICD-10-CM | POA: Insufficient documentation

## 2013-11-22 DIAGNOSIS — Z85118 Personal history of other malignant neoplasm of bronchus and lung: Secondary | ICD-10-CM | POA: Diagnosis not present

## 2013-11-22 DIAGNOSIS — K625 Hemorrhage of anus and rectum: Secondary | ICD-10-CM

## 2013-11-22 DIAGNOSIS — K449 Diaphragmatic hernia without obstruction or gangrene: Secondary | ICD-10-CM | POA: Diagnosis not present

## 2013-11-22 DIAGNOSIS — K648 Other hemorrhoids: Secondary | ICD-10-CM | POA: Diagnosis not present

## 2013-11-22 DIAGNOSIS — F329 Major depressive disorder, single episode, unspecified: Secondary | ICD-10-CM | POA: Diagnosis not present

## 2013-11-22 DIAGNOSIS — K642 Third degree hemorrhoids: Secondary | ICD-10-CM

## 2013-11-22 DIAGNOSIS — K219 Gastro-esophageal reflux disease without esophagitis: Secondary | ICD-10-CM | POA: Diagnosis not present

## 2013-11-22 DIAGNOSIS — Z79899 Other long term (current) drug therapy: Secondary | ICD-10-CM | POA: Diagnosis not present

## 2013-11-22 DIAGNOSIS — R1314 Dysphagia, pharyngoesophageal phase: Secondary | ICD-10-CM | POA: Diagnosis not present

## 2013-11-22 DIAGNOSIS — R110 Nausea: Secondary | ICD-10-CM | POA: Insufficient documentation

## 2013-11-22 DIAGNOSIS — R109 Unspecified abdominal pain: Secondary | ICD-10-CM | POA: Diagnosis present

## 2013-11-22 DIAGNOSIS — I10 Essential (primary) hypertension: Secondary | ICD-10-CM | POA: Diagnosis not present

## 2013-11-22 DIAGNOSIS — K210 Gastro-esophageal reflux disease with esophagitis: Secondary | ICD-10-CM | POA: Diagnosis not present

## 2013-11-22 DIAGNOSIS — K643 Fourth degree hemorrhoids: Secondary | ICD-10-CM | POA: Insufficient documentation

## 2013-11-22 HISTORY — PX: COLONOSCOPY WITH PROPOFOL: SHX5780

## 2013-11-22 HISTORY — PX: BIOPSY: SHX5522

## 2013-11-22 HISTORY — PX: MALONEY DILATION: SHX5535

## 2013-11-22 HISTORY — PX: ESOPHAGOGASTRODUODENOSCOPY (EGD) WITH PROPOFOL: SHX5813

## 2013-11-22 SURGERY — COLONOSCOPY WITH PROPOFOL
Anesthesia: Monitor Anesthesia Care

## 2013-11-22 MED ORDER — LIDOCAINE VISCOUS 2 % MT SOLN
OROMUCOSAL | Status: AC
  Filled 2013-11-22: qty 15

## 2013-11-22 MED ORDER — MIDAZOLAM HCL 2 MG/2ML IJ SOLN
INTRAMUSCULAR | Status: AC
  Filled 2013-11-22: qty 2

## 2013-11-22 MED ORDER — ONDANSETRON HCL 4 MG/2ML IJ SOLN
4.0000 mg | Freq: Once | INTRAMUSCULAR | Status: AC
  Administered 2013-11-22: 4 mg via INTRAVENOUS

## 2013-11-22 MED ORDER — PROPOFOL 10 MG/ML IV BOLUS
INTRAVENOUS | Status: AC
  Filled 2013-11-22: qty 20

## 2013-11-22 MED ORDER — MIDAZOLAM HCL 2 MG/2ML IJ SOLN
1.0000 mg | INTRAMUSCULAR | Status: DC | PRN
  Administered 2013-11-22: 2 mg via INTRAVENOUS

## 2013-11-22 MED ORDER — PROPOFOL INFUSION 10 MG/ML OPTIME
INTRAVENOUS | Status: DC | PRN
  Administered 2013-11-22: 75 ug/kg/min via INTRAVENOUS
  Administered 2013-11-22: 09:00:00 via INTRAVENOUS

## 2013-11-22 MED ORDER — ONDANSETRON HCL 4 MG/2ML IJ SOLN
INTRAMUSCULAR | Status: AC
  Filled 2013-11-22: qty 2

## 2013-11-22 MED ORDER — LACTATED RINGERS IV SOLN
INTRAVENOUS | Status: DC | PRN
  Administered 2013-11-22: 08:00:00 via INTRAVENOUS

## 2013-11-22 MED ORDER — ONDANSETRON HCL 4 MG/2ML IJ SOLN: 4.0000 mg | Freq: Once | INTRAMUSCULAR | Status: DC | PRN

## 2013-11-22 MED ORDER — FENTANYL CITRATE 0.05 MG/ML IJ SOLN
25.0000 ug | INTRAMUSCULAR | Status: AC
  Administered 2013-11-22 (×2): 25 ug via INTRAVENOUS

## 2013-11-22 MED ORDER — STERILE WATER FOR IRRIGATION IR SOLN
Status: DC | PRN
  Administered 2013-11-22: 1005 mL

## 2013-11-22 MED ORDER — LACTATED RINGERS IV SOLN
INTRAVENOUS | Status: DC
  Administered 2013-11-22: 1000 mL via INTRAVENOUS

## 2013-11-22 MED ORDER — FENTANYL CITRATE 0.05 MG/ML IJ SOLN
INTRAMUSCULAR | Status: AC
  Filled 2013-11-22: qty 2

## 2013-11-22 MED ORDER — FENTANYL CITRATE 0.05 MG/ML IJ SOLN: 25.0000 ug | INTRAMUSCULAR | Status: DC | PRN

## 2013-11-22 MED ORDER — LIDOCAINE VISCOUS 2 % MT SOLN
2.0000 mL | OROMUCOSAL | Status: AC
  Administered 2013-11-22 (×2): 2 mL via OROMUCOSAL
  Filled 2013-11-22: qty 5

## 2013-11-22 MED ORDER — WATER FOR IRRIGATION, STERILE IR SOLN
Status: DC | PRN
  Administered 2013-11-22: 1000 mL

## 2013-11-22 MED ORDER — GLYCOPYRROLATE 0.2 MG/ML IJ SOLN
0.2000 mg | Freq: Once | INTRAMUSCULAR | Status: AC
  Administered 2013-11-22: 0.2 mg via INTRAVENOUS

## 2013-11-22 MED ORDER — MIDAZOLAM HCL 5 MG/5ML IJ SOLN
INTRAMUSCULAR | Status: DC | PRN
  Administered 2013-11-22 (×2): 1 mg via INTRAVENOUS

## 2013-11-22 MED ORDER — FENTANYL CITRATE 0.05 MG/ML IJ SOLN
INTRAMUSCULAR | Status: DC | PRN
  Administered 2013-11-22 (×2): 50 ug via INTRAVENOUS

## 2013-11-22 MED ORDER — GLYCOPYRROLATE 0.2 MG/ML IJ SOLN
INTRAMUSCULAR | Status: AC
  Filled 2013-11-22: qty 1

## 2013-11-22 SURGICAL SUPPLY — 28 items
BLOCK BITE 60FR ADLT L/F BLUE (MISCELLANEOUS) ×2 IMPLANT
DEVICE CLIP HEMOSTAT 235CM (CLIP) IMPLANT
ELECT REM PT RETURN 9FT ADLT (ELECTROSURGICAL)
ELECTRODE REM PT RTRN 9FT ADLT (ELECTROSURGICAL) IMPLANT
FCP BXJMBJMB 240X2.8X (CUTTING FORCEPS)
FLOOR PAD 36X40 (MISCELLANEOUS) ×2
FORCEPS BIOP RAD 4 LRG CAP 4 (CUTTING FORCEPS) ×2 IMPLANT
FORCEPS BIOP RJ4 240 W/NDL (CUTTING FORCEPS)
FORCEPS BXJMBJMB 240X2.8X (CUTTING FORCEPS) IMPLANT
FORMALIN 10 PREFIL 20ML (MISCELLANEOUS) ×1 IMPLANT
INJECTOR/SNARE I SNARE (MISCELLANEOUS) IMPLANT
KIT CLEAN ENDO COMPLIANCE (KITS) ×2 IMPLANT
LUBRICANT JELLY 4.5OZ STERILE (MISCELLANEOUS) ×1 IMPLANT
MANIFOLD NEPTUNE II (INSTRUMENTS) ×1 IMPLANT
NDL SCLEROTHERAPY 25GX240 (NEEDLE) ×1 IMPLANT
NEEDLE SCLEROTHERAPY 25GX240 (NEEDLE) IMPLANT
PAD FLOOR 36X40 (MISCELLANEOUS) ×1 IMPLANT
PROBE APC STR FIRE (PROBE) ×1 IMPLANT
PROBE INJECTION GOLD (MISCELLANEOUS)
PROBE INJECTION GOLD 7FR (MISCELLANEOUS) ×1 IMPLANT
SNARE ROTATE MED OVAL 20MM (MISCELLANEOUS) ×1 IMPLANT
SNARE SHORT THROW 13M SML OVAL (MISCELLANEOUS) ×1 IMPLANT
SYR 50ML LL SCALE MARK (SYRINGE) ×1 IMPLANT
SYR INFLATION 60ML (SYRINGE) ×2 IMPLANT
TRAP SPECIMEN MUCOUS 40CC (MISCELLANEOUS) ×1 IMPLANT
TUBING ENDO SMARTCAP PENTAX (MISCELLANEOUS) ×2 IMPLANT
TUBING IRRIGATION ENDOGATOR (MISCELLANEOUS) ×2 IMPLANT
WATER STERILE IRR 1000ML POUR (IV SOLUTION) ×1 IMPLANT

## 2013-11-22 NOTE — Anesthesia Postprocedure Evaluation (Signed)
  Anesthesia Post-op Note  Patient: [name]  Procedure(s) Performed: Procedure(s) with comments: COLONOSCOPY WITH PROPOFOL (N/A) - cecum time in  0935     time out   0942       total time 7 minutes ESOPHAGOGASTRODUODENOSCOPY (EGD) WITH PROPOFOL (N/A) MALONEY DILATION (N/A) - 54 GASTRIC BIOPSY (N/A)  Patient Location: PACU  Anesthesia Type:MAC  Level of Consciousness: awake, alert , oriented and patient cooperative  Airway and Oxygen Therapy: Patient Spontanous Breathing  Post-op Pain: none  Post-op Assessment: Post-op Vital signs reviewed, Patient's Cardiovascular Status Stable, Respiratory Function Stable, Patent Airway and No signs of Nausea or vomiting  Post-op Vital Signs: Reviewed and stable  Last Vitals:  Filed Vitals:   11/22/13 1023  BP: 135/68  Pulse: 78  Temp: 36.4 C  Resp: 20    Complications: No apparent anesthesia complications

## 2013-11-22 NOTE — Op Note (Signed)
[facility] [address] [place], [address]   ENDOSCOPY PROCEDURE REPORT  PATIENT: Shawna, Trevino  MR#: [id number] BIRTHDATE: 1960/10/02 , 52  yrs. old GENDER: female ENDOSCOPIST: R.  Garfield Cornea, MD [facility] REFERRED BY:     Dr. Kenn File PROCEDURE DATE:  2013-11-30 PROCEDURE:  EGD w/ biopsy and Maloney dilation of esophagus INDICATIONS: MEDICATIONS: ASA CLASS:      Class II  CONSENT: The risks, benefits, limitations, alternatives and imponderables have been discussed.  The potential for biopsy, esophogeal dilation, etc. have also been reviewed.  Questions have been answered.  All parties agreeable.  Please see the history and physical in the medical record for more information.  DESCRIPTION OF PROCEDURE: After the risks benefits and alternatives of the procedure were thoroughly explained, informed consent was obtained.  The    endoscope was introduced through the mouth and advanced to the second portion of the duodenum , limited by Without limitations. The instrument was slowly withdrawn as the mucosa was fully examined.    Incomplete Schatzki's ring; otherwise normal esophagus.  Stomach empty.  Small hiatal hernia.  Focal area of fibrotic erythematous mucosa with superficial linear erosions within this area in the antrum.  No ulcer or infiltrating process seen.  Patent pylorus. Normal first and second portion of the duodenum.  A 54 French Maloney dilator was passed a full insertion.  Her mouth would not open very far.  [name] was advanced easily.  I did not feel a 34 [place] would be accommodated.  I looked back and found the ring remained intact. I subsequently broke the ring up with biopsy forcep disruption.  The abnormal gastric mucosa was also biopsied.  Retroflexed views revealed as previously described.     The scope was then withdrawn from the patient and the procedure completed.  COMPLICATIONS: There were no immediate  complications.  ENDOSCOPIC IMPRESSION: Incomplete Schatzki's ring dilated and disrupted as described above. Small hiatal hernia. Focally abnormal gastric mucosa of uncertain significance?"status post biopsy  RECOMMENDATIONS: Stop Nexium; begin Dexilant 60 mg daily. Follow-up on pathology. See colonoscopy report.  REPEAT EXAM:  eSigned:  R. Garfield Cornea, MD Rosalita Chessman [facility] 11-30-2013 10:05 AM    CC:  CPT CODES: ICD CODES:  The ICD and CPT codes recommended by this software are interpretations from the data that the clinical staff has captured with the software.  The verification of the translation of this report to the ICD and CPT codes and modifiers is the sole responsibility of the health care institution and practicing physician where this report was generated.  [employer] will not be held responsible for the validity of the ICD and CPT codes included on this report.  AMA assumes no liability for data contained or not contained herein. CPT is a Designer, television/film set of the [employer].  PATIENT NAME:  Shawna, Trevino MR#: [id number]

## 2013-11-22 NOTE — Anesthesia Preprocedure Evaluation (Signed)
Anesthesia Evaluation  Patient identified by MRN, date of birth, ID band Patient awake    Reviewed: Allergy & Precautions, H&P , NPO status , Patient's Chart, lab work & pertinent test results  History of Anesthesia Complications Negative for: history of anesthetic complications  Airway Mallampati: III  TM Distance: >3 FB   Mouth opening: Limited Mouth Opening  Dental  (+) Edentulous Upper, Partial Lower   Pulmonary asthma , sleep apnea and Continuous Positive Airway Pressure Ventilation , former smoker,  L Lower Lobectomy in 1997 for CA breath sounds clear to auscultation        Cardiovascular hypertension, Pt. on medications Rhythm:Regular Rate:Normal     Neuro/Psych PSYCHIATRIC DISORDERS Depression    GI/Hepatic GERD- (inactive now)  Controlled and Medicated,  Endo/Other    Renal/GU      Musculoskeletal   Abdominal   Peds  Hematology   Anesthesia Other Findings   Reproductive/Obstetrics                             Anesthesia Physical Anesthesia Plan  ASA: III  Anesthesia Plan: MAC   Post-op Pain Management:    Induction: Intravenous  Airway Management Planned: Simple Face Mask  Additional Equipment:   Intra-op Plan:   Post-operative Plan:   Informed Consent: I have reviewed the patients History and Physical, chart, labs and discussed the procedure including the risks, benefits and alternatives for the proposed anesthesia with the patient or authorized representative who has indicated his/her understanding and acceptance.     Plan Discussed with:   Anesthesia Plan Comments:         Anesthesia Quick Evaluation

## 2013-11-22 NOTE — Transfer of Care (Signed)
Immediate Anesthesia Transfer of Care Note  Patient: [name]  Procedure(s) Performed: Procedure(s) with comments: COLONOSCOPY WITH PROPOFOL (N/A) - cecum time in  0935     time out   0942       total time 7 minutes ESOPHAGOGASTRODUODENOSCOPY (EGD) WITH PROPOFOL (N/A) MALONEY DILATION (N/A) - 54 GASTRIC BIOPSY (N/A)  Patient Location: PACU  Anesthesia Type:MAC  Level of Consciousness: sedated and patient cooperative  Airway & Oxygen Therapy: Patient Spontanous Breathing and Patient connected to face mask oxygen  Post-op Assessment: Report given to PACU RN and Post -op Vital signs reviewed and stable  Post vital signs: Reviewed and stable  Complications: No apparent anesthesia complications

## 2013-11-22 NOTE — Op Note (Signed)
[place], [id number]   COLONOSCOPY PROCEDURE REPORT  PATIENT: [name], [name]  MR#: [id number] BIRTHDATE: 04/14/1960 , 52  yrs. old GENDER: female ENDOSCOPIST: R.  Garfield Cornea, MD [facility] REFERRED BY:   Dr. Kenn File PROCEDURE DATE:  Dec 08, 2013 PROCEDURE:   Colonoscopy, diagnostic INDICATIONS:hematochezia. MEDICATIONS: deep sedation per Dr.  [facility] ASA CLASS:  CONSENT: The risks, benefits, alternatives and imponderables including but not limited to bleeding, perforation as well as the possibility of a missed lesion have been reviewed.  The potential for biopsy, lesion removal, etc. have also been discussed. Questions have been answered.  All parties agreeable.  Please see the history and physical in the medical record for more information.  DESCRIPTION OF PROCEDURE:   After the risks benefits and alternatives of the procedure were thoroughly explained, informed consent was obtained.  The digital rectal exam revealed no abnormalities of the rectum.   The     endoscope was introduced through the anus and advanced to the cecum, which was identified by both the appendix and ileocecal valve. No adverse events experienced.   The quality of the prep was adequate.  The instrument was then slowly withdrawn as the colon was fully examined.      COLON FINDINGS: Patient had small grade 4 hemorrhoid tag and a small grade 3 hemorrhoid tag; patient also had internal hemorrhoids; otherwise normal rectum.  Normal-appearing colonic mucosa. Retroflexion was performed. .  Withdrawal time=7 minutes 0 seconds.  The scope was withdrawn and the procedure completed. COMPLICATIONS: There were no immediate complications.  ENDOSCOPIC IMPRESSION: Grade 3 and 4/internal hemorrhoids?"likely source of hematochezia Normal colonoscopy otherwise.  RECOMMENDATIONS: Continue Linzess for constipation. 10 day course of Anusol suppositories  1 per rectum twice daily. If bleeding persists, might be reasonable to try hemorrhoid banding although the grade 4 hemorrhoid would likely not be helped with this maneuver.  Office visit with [place] in 6 weeks. See colonoscopy report.  eSigned:  R. Garfield Cornea, MD Rosalita Chessman [facility] December 08, 2013 10:10 AM   cc:  CPT CODES: ICD CODES:  The ICD and CPT codes recommended by this software are interpretations from the data that the clinical staff has captured with the software.  The verification of the translation of this report to the ICD and CPT codes and modifiers is the sole responsibility of the health care institution and practicing physician where this report was generated.  [employer] will not be held responsible for the validity of the ICD and CPT codes included on this report.  AMA assumes no liability for data contained or not contained herein. CPT is a Designer, television/film set of the [employer].  PATIENT NAME:  [name], [name] MR#: [id number]

## 2013-11-22 NOTE — H&P (View-Only) (Signed)
Primary Care Physician:  Kenn File, MD  Primary Gastroenterologist:  Garfield Cornea, MD   Chief Complaint  Patient presents with  . Abdominal Pain    HPI:  [name] is a 53 y.o. female here for further evaluation of abdominal pain at the request of Dr. Kenn File.  Complains of bloating/swelling, abdominal pain diffuse, constipation (chronically). Associated nausea, back pain. Abdominal pain for couple of months. Nothing makes better or worse. Some brbpr. Postprandial nausea. Coughs up blood, she believes is coming from her lungs rather than vomiting. Poor appetite. +solid food dysphagia. No heartburn. Stool softeners. Two fleets enemas last week without results. Laxative gripes. No melena. Weight down 7 pounds this year, unintentional. BM less than once per week. H/O chronic GERD, has to take omeprazole. Remote sigmoidoscopy 1997.  Recent stopped Mobic. Has not started the ibuprofen.   Current Outpatient Prescriptions  Medication Sig Dispense Refill  . acyclovir (ZOVIRAX) 400 MG tablet Take 1 tablet (400 mg total) by mouth 2 (two) times daily.  60 tablet  6  . albuterol (PROAIR HFA) 108 (90 BASE) MCG/ACT inhaler Inhale 2 puffs into the lungs every 4 (four) hours as needed. for shortness of breath  1 each  2  . albuterol (PROVENTIL HFA;VENTOLIN HFA) 108 (90 BASE) MCG/ACT inhaler Inhale 2 puffs into the lungs every 4 (four) hours as needed for wheezing or shortness of breath.  1 Inhaler  0  . amitriptyline (ELAVIL) 150 MG tablet TAKE ONE TABLET BY MOUTH ONCE DAILY AT BEDTIME  30 tablet  1  . atorvastatin (LIPITOR) 40 MG tablet Take 1 tablet (40 mg total) by mouth daily.  30 tablet  11  . Calcium Citrate-Vitamin D 250-200 MG-UNIT TABS Take 1 tablet by mouth daily.       . DULoxetine (CYMBALTA) 60 MG capsule Take 1 capsule (60 mg total) by mouth 2 (two) times daily.  120 capsule  11  . gabapentin (NEURONTIN) 300 MG capsule Take 1 capsule (300 mg total) by mouth 3 (three) times  daily.  90 capsule  11  . hydrochlorothiazide (HYDRODIURIL) 25 MG tablet Take 1 tablet (25 mg total) by mouth daily.  90 tablet  3  . ibuprofen (ADVIL,MOTRIN) 600 MG tablet Take 1 tablet (600 mg total) by mouth every 8 (eight) hours as needed. Do not take with mobic  30 tablet  0  . mineral oil enema Place 1 enema rectally once as needed for mild constipation or severe constipation.      Marland Kitchen omeprazole (PRILOSEC) 40 MG capsule Take 1 capsule (40 mg total) by mouth daily.  90 capsule  3  . ondansetron (ZOFRAN) 4 MG tablet Take 1 tablet (4 mg total) by mouth every 8 (eight) hours as needed for nausea or vomiting.  6 tablet  0  . traZODone (DESYREL) 50 MG tablet Take 150 mg by mouth at bedtime.         No current facility-administered medications for this visit.    Allergies as of 11/06/2013 - Review Complete 11/06/2013  Allergen Reaction Noted  . Promethazine hcl Nausea And Vomiting and Other (See Comments) 07/26/2008    Past Medical History  Diagnosis Date  . HTN (hypertension)   . HLD (hyperlipidemia)   . Osteoarthrosis involving more than one site but not generalized   . Depression   . GERD (gastroesophageal reflux disease)   . Asthma   . Chronic abdominal pain   . Chronic joint pain   . Chronic back pain   .  Cancer 1997    left lung  . Palpitations 01/2013    chronic, intermittent  . Nausea and vomiting     chronic, recurrent    Past Surgical History  Procedure Laterality Date  . Tubal ligation    . Abdominal hysterectomy    . Abdominal surgery      ovarian cyst removal  . Lung cancer surgery  1997    age 72, resection only  . Bladder surgery  suspension x4    x 4  . Lobectomy Left   . Excision vaginal cyst Left 06/20/2012    Procedure: EXCISION LEFT LABIAL CYST;  Surgeon: Jonnie Kind, MD;  Location: [facility];  Service: Gynecology;  Laterality: Left;    Family History  Problem Relation Age of Onset  . Hypertension Mother   . Kidney disease Mother   . Hypertension  Father   . Kidney disease Father   . Heart disease Father   . Cancer Father     leukemia  . Hypertension Sister   . Hypertension Sister   . Diabetes Other   . Heart disease Other     has Psychologist, forensic  . Colon cancer Neg Hx     History   Social History  . Marital Status: Married    Spouse Name: N/A    Number of Children: 3  . Years of Education: N/A   Occupational History  . Not on file.   Social History Main Topics  . Smoking status: Former Smoker    Types: Cigarettes    Quit date: 04/07/1995  . Smokeless tobacco: Never Used  . Alcohol Use: No  . Drug Use: No  . Sexual Activity: Not Currently    Birth Control/ Protection: Surgical   Other Topics Concern  . Not on file   Social History Narrative  . No narrative on file      ROS:  General: No fever. Please see history of present illness.  Eyes: Negative for vision changes.  ENT: Negative for hoarseness,  nasal congestion. CV: Negative for chest pain, angina, palpitations, dyspnea on exertion, peripheral edema.  Respiratory: Negative for dyspnea at rest, dyspnea on exertion, cough, sputum, wheezing.  GI: See history of present illness. GU:  Negative for dysuria, hematuria, urinary incontinence, urinary frequency, nocturnal urination.  MS: Chronic joint/back pain.  Derm: Negative for rash or itching.  Neuro: Negative for weakness, abnormal sensation, seizure, frequent headaches, memory loss, confusion.  Psych: Negative for anxiety, depression, suicidal ideation, hallucinations.  Endo: See history of present illness  Heme: Negative for bruising or bleeding. Allergy: Negative for rash or hives.    Physical Examination:  BP 133/72  Pulse 83  Temp(Src) 97.7 F (36.5 C) (Oral)  Resp 18  Ht '5\' 6"'  (1.676 m)  Wt 183 lb (83.008 kg)  BMI 29.55 kg/m2   General: Well-nourished, well-developed in no acute distress. Accompanied by daughter Head: Normocephalic, atraumatic.   Eyes: Conjunctiva pink, no  icterus. Mouth: Oropharyngeal mucosa moist and pink , no lesions erythema or exudate. Neck: Supple without thyromegaly, masses, or lymphadenopathy.  Lungs: Clear to auscultation bilaterally.  Heart: Regular rate and rhythm, no murmurs rubs or gallops.  Abdomen: Bowel sounds are normal, vague diffuse tenderness, nondistended, no hepatosplenomegaly or masses, no abdominal bruits or    hernia , no rebound or guarding.   Rectal:not performed Extremities: No lower extremity edema. No clubbing or deformities.  Neuro: Alert and oriented x 4 , grossly normal neurologically.  Skin: Warm and dry,  no rash or jaundice.   Psych: Alert and cooperative, normal mood and affect.  Labs: Lab Results  Component Value Date   LIPASE 17 10/31/2013   Lab Results  Component Value Date   CREATININE 0.87 10/31/2013   BUN 6 10/31/2013   NA 142 10/31/2013   K 3.2* 10/31/2013   CL 99 10/31/2013   CO2 31 10/31/2013   Lab Results  Component Value Date   ALT 14 10/31/2013   AST 12 10/31/2013   ALKPHOS 164* 10/31/2013   BILITOT 0.3 10/31/2013   Component     Latest Ref Rng 10/14/2010 06/16/2012 10/12/2012 02/15/2013 08/05/2013  Alkaline Phosphatase     39 - 117 U/L 142 (H)  144 (H)  184 (H)   Component     Latest Ref Rng 10/03/2013 10/31/2013  Alkaline Phosphatase     39 - 117 U/L 135 (H) 164 (H)    Lab Results  Component Value Date   WBC 5.3 10/31/2013   HGB 13.0 10/31/2013   HCT 38.1 10/31/2013   MCV 93.8 10/31/2013   PLT 297 10/31/2013   H.pylori screen, POC: negative ANA negative in 02/2013  Imaging Studies: Dg Chest 2 View  10/31/2013   CLINICAL DATA:  53 year old female with productive cough x2 weeks, increasing. Acute shortness of Breath and vomiting of phlegm tinged with blood. Central chest pain. Initial encounter. Personal history of lung cancer in 1997, and former smoker.  EXAM: CHEST  2 VIEW  COMPARISON:  Chest CTA 12/02/2008. Chest radiographs 12/02/2008 and earlier.  FINDINGS: Stable  staple line along the left mediastinum. Stable lung volumes, mildly decreased on the left with tenting of the left hemidiaphragm. Stable cardiac size and mediastinal contours. Visualized tracheal air column is within normal limits. No pneumothorax or pulmonary edema. No pleural effusion or confluent pulmonary opacity. Negative visible bowel gas pattern. No acute osseous abnormality identified.  IMPRESSION: Stable postoperative appearance of the chest. No acute cardiopulmonary abnormality.   Electronically Signed   By: Lars Pinks M.D.   On: 10/31/2013 11:25   CT A/P with contrast 07/2013 Mild increased stool throughout the colon. Mild fatty liver.   Impression/plan: 53 year old lady with multiple GI complaints. She has chronic constipation, worsened recently and associated with bloating/swelling, diffuse abdominal pain. No prior colonoscopy. Intermittent bright red blood per rectum. Suspect functional constipation with benign anorectal bleeding that she needs colonoscopy for further evaluation. She also has chronic GERD which is well controlled, solid food esophageal dysphagia, anorexia, nausea. Previously on Mobic. Recommend EGD for to evaluate for stricture/ring as well as gastritis/peptic ulcer disease. Given polypharmacy we'll plan on EGD/esophageal dilation, colonoscopy with deep sedation with Dr. Gala Romney. I have discussed the risks, alternatives, benefits with regards to but not limited to the risk of reaction to medication, bleeding, infection, perforation and the patient is agreeable to proceed. Written consent to be obtained.  She has h/o chronically elevated alk phos. Further evaluation planned.   Start Linzess 237mg daily for constipation. If symptoms not improved by next week, then she will call and we can modify bowel regimen.   Check labs including LFTs, AMA/ASMA, AP isoenzymes.

## 2013-11-22 NOTE — Anesthesia Procedure Notes (Signed)
Procedure Name: MAC Date/Time: 11/22/2013 8:49 AM Performed by: Andree Elk, Elisah Parmer A Pre-anesthesia Checklist: Patient identified, Timeout performed, Emergency Drugs available, Suction available and Patient being monitored Oxygen Delivery Method: Simple face mask

## 2013-11-22 NOTE — Interval H&P Note (Signed)
History and Physical Interval Note:  11/22/2013 8:34 AM  [name]  has presented today for surgery, with the diagnosis of abd pain/weight loss/nausea/rectal bleeding/constipation/GERD  The various methods of treatment have been discussed with the patient and family. After consideration of risks, benefits and other options for treatment, the patient has consented to  Procedure(s) with comments: COLONOSCOPY WITH PROPOFOL (N/A) - 830  ESOPHAGOGASTRODUODENOSCOPY (EGD) WITH PROPOFOL (N/A) SAVORY DILATION (N/A) MALONEY DILATION (N/A) as a surgical intervention .  The patient's history has been reviewed, patient examined, no change in status, stable for surgery.  I have reviewed the patient's chart and labs.  Questions were answered to the patient's satisfaction.     [name]  No change. EGD with esophageal dilation as appropriate and diagnostic colonoscopy per plan.The risks, benefits, limitations, imponderables and alternatives regarding both EGD and colonoscopy have been reviewed with the patient. Questions have been answered. All parties agreeable.

## 2013-11-22 NOTE — Discharge Instructions (Signed)
Colonoscopy Discharge Instructions  Read the instructions outlined below and refer to this sheet in the next few weeks. These discharge instructions provide you with general information on caring for yourself after you leave the hospital. Your doctor may also give you specific instructions. While your treatment has been planned according to the most current medical practices available, unavoidable complications occasionally occur. If you have any problems or questions after discharge, call Dr. Gala Romney at [phone]. ACTIVITY  You may resume your regular activity, but move at a slower pace for the next 24 hours.   Take frequent rest periods for the next 24 hours.   Walking will help get rid of the air and reduce the bloated feeling in your belly (abdomen).   No driving for 24 hours (because of the medicine (anesthesia) used during the test).    Do not sign any important legal documents or operate any machinery for 24 hours (because of the anesthesia used during the test).  NUTRITION  Drink plenty of fluids.   You may resume your normal diet as instructed by your doctor.   Begin with a light meal and progress to your normal diet. Heavy or fried foods are harder to digest and may make you feel sick to your stomach (nauseated).   Avoid alcoholic beverages for 24 hours or as instructed.  MEDICATIONS  You may resume your normal medications unless your doctor tells you otherwise.  WHAT YOU CAN EXPECT TODAY  Some feelings of bloating in the abdomen.   Passage of more gas than usual.   Spotting of blood in your stool or on the toilet paper.  IF YOU HAD POLYPS REMOVED DURING THE COLONOSCOPY:  No aspirin products for 7 days or as instructed.   No alcohol for 7 days or as instructed.   Eat a soft diet for the next 24 hours.  FINDING OUT THE RESULTS OF YOUR TEST Not all test results are available during your visit. If your test results are not back during the visit, make an appointment  with your caregiver to find out the results. Do not assume everything is normal if you have not heard from your caregiver or the medical facility. It is important for you to follow up on all of your test results.  SEEK IMMEDIATE MEDICAL ATTENTION IF:  You have more than a spotting of blood in your stool.   Your belly is swollen (abdominal distention).   You are nauseated or vomiting.   You have a temperature over 101.  You have abdominal pain or discomfort that is severe or gets worse throughout the day. EGD Discharge instructions Please read the instructions outlined below and refer to this sheet in the next few weeks. These discharge instructions provide you with general information on caring for yourself after you leave the hospital. Your doctor may also give you specific instructions. While your treatment has been planned according to the most current medical practices available, unavoidable complications occasionally occur. If you have any problems or questions after discharge, please call your doctor. ACTIVITY You may resume your regular activity but move at a slower pace for the next 24 hours.  Take frequent rest periods for the next 24 hours.  Walking will help expel (get rid of) the air and reduce the bloated feeling in your abdomen.  No driving for 24 hours (because of the anesthesia (medicine) used during the test).  You may shower.  Do not sign any important legal documents or operate any machinery for 24  hours (because of the anesthesia used during the test).  NUTRITION Drink plenty of fluids.  You may resume your normal diet.  Begin with a light meal and progress to your normal diet.  Avoid alcoholic beverages for 24 hours or as instructed by your caregiver.  MEDICATIONS You may resume your normal medications unless your caregiver tells you otherwise.  WHAT YOU CAN EXPECT TODAY You may experience abdominal discomfort such as a feeling of fullness or gas pains.   FOLLOW-UP Your doctor will discuss the results of your test with you.  SEEK IMMEDIATE MEDICAL ATTENTION IF ANY OF THE FOLLOWING OCCUR: Excessive nausea (feeling sick to your stomach) and/or vomiting.  Severe abdominal pain and distention (swelling).  Trouble swallowing.  Temperature over 101 F (37.8 C).  Rectal bleeding or vomiting of blood.       Constipation, hemorrhoid and GERD information provided  Stop Nexium; begin Dexilant 60 mg daily  Two-week course of Anusol suppositories.  Office visit with [place] in 6 weeks  Further recommendations to follow pending review of pathology report  Constipation Constipation is when a person has fewer than three bowel movements a week, has difficulty having a bowel movement, or has stools that are dry, hard, or larger than normal. As people grow older, constipation is more common. If you try to fix constipation with medicines that make you have a bowel movement (laxatives), the problem may get worse. Long-term laxative use may cause the muscles of the colon to become weak. A low-fiber diet, not taking in enough fluids, and taking certain medicines may make constipation worse.  CAUSES   Certain medicines, such as antidepressants, pain medicine, iron supplements, antacids, and water pills.   Certain diseases, such as diabetes, irritable bowel syndrome (IBS), thyroid disease, or depression.   Not drinking enough water.   Not eating enough fiber-rich foods.   Stress or travel.   Lack of physical activity or exercise.   Ignoring the urge to have a bowel movement.   Using laxatives too much.  SIGNS AND SYMPTOMS   Having fewer than three bowel movements a week.   Straining to have a bowel movement.   Having stools that are hard, dry, or larger than normal.   Feeling full or bloated.   Pain in the lower abdomen.   Not feeling relief after having a bowel movement.  DIAGNOSIS  Your health care provider will take a  medical history and perform a physical exam. Further testing may be done for severe constipation. Some tests may include:  A barium enema X-ray to examine your rectum, colon, and, sometimes, your small intestine.   A sigmoidoscopy to examine your lower colon.   A colonoscopy to examine your entire colon. TREATMENT  Treatment will depend on the severity of your constipation and what is causing it. Some dietary treatments include drinking more fluids and eating more fiber-rich foods. Lifestyle treatments may include regular exercise. If these diet and lifestyle recommendations do not help, your health care provider may recommend taking over-the-counter laxative medicines to help you have bowel movements. Prescription medicines may be prescribed if over-the-counter medicines do not work.  HOME CARE INSTRUCTIONS   Eat foods that have a lot of fiber, such as fruits, vegetables, whole grains, and beans.  Limit foods high in fat and processed sugars, such as french fries, hamburgers, cookies, candies, and soda.   A fiber supplement may be added to your diet if you cannot get enough fiber from foods.   Drink enough  fluids to keep your urine clear or pale yellow.   Exercise regularly or as directed by your health care provider.   Go to the restroom when you have the urge to go. Do not hold it.   Only take over-the-counter or prescription medicines as directed by your health care provider. Do not take other medicines for constipation without talking to your health care provider first.  Tubac IF:   You have bright red blood in your stool.   Your constipation lasts for more than 4 days or gets worse.   You have abdominal or rectal pain.   You have thin, pencil-like stools.   You have unexplained weight loss. MAKE SURE YOU:   Understand these instructions.  Will watch your condition.  Will get help right away if you are not doing well or get  worse. Document Released: 10/03/2003 Document Revised: 01/09/2013 Document Reviewed: 10/16/2012 [facility] Patient Information 2015 [place], [place]. This information is not intended to replace advice given to you by your health care provider. Make sure you discuss any questions you have with your health care provider.  Hemorrhoids Hemorrhoids are swollen veins around the rectum or anus. There are two types of hemorrhoids:   Internal hemorrhoids. These occur in the veins just inside the rectum. They may poke through to the outside and become irritated and painful.  External hemorrhoids. These occur in the veins outside the anus and can be felt as a painful swelling or hard lump near the anus. CAUSES  Pregnancy.   Obesity.   Constipation or diarrhea.   Straining to have a bowel movement.   Sitting for long periods on the toilet.  Heavy lifting or other activity that caused you to strain.  Anal intercourse. SYMPTOMS   Pain.   Anal itching or irritation.   Rectal bleeding.   Fecal leakage.   Anal swelling.   One or more lumps around the anus.  DIAGNOSIS  Your caregiver may be able to diagnose hemorrhoids by visual examination. Other examinations or tests that may be performed include:   Examination of the rectal area with a gloved hand (digital rectal exam).   Examination of anal canal using a small tube (scope).   A blood test if you have lost a significant amount of blood.  A test to look inside the colon (sigmoidoscopy or colonoscopy). TREATMENT Most hemorrhoids can be treated at home. However, if symptoms do not seem to be getting better or if you have a lot of rectal bleeding, your caregiver may perform a procedure to help make the hemorrhoids get smaller or remove them completely. Possible treatments include:   Placing a rubber band at the base of the hemorrhoid to cut off the circulation (rubber band ligation).   Injecting a chemical to shrink the  hemorrhoid (sclerotherapy).   Using a tool to burn the hemorrhoid (infrared light therapy).   Surgically removing the hemorrhoid (hemorrhoidectomy).   Stapling the hemorrhoid to block blood flow to the tissue (hemorrhoid stapling).  HOME CARE INSTRUCTIONS   Eat foods with fiber, such as whole grains, beans, nuts, fruits, and vegetables. Ask your doctor about taking products with added fiber in them (fibersupplements).  Increase fluid intake. Drink enough water and fluids to keep your urine clear or pale yellow.   Exercise regularly.   Go to the bathroom when you have the urge to have a bowel movement. Do not wait.   Avoid straining to have bowel movements.   Keep the anal  area dry and clean. Use wet toilet paper or moist towelettes after a bowel movement.   Medicated creams and suppositories may be used or applied as directed.   Only take over-the-counter or prescription medicines as directed by your caregiver.   Take warm sitz baths for 15-20 minutes, 3-4 times a day to ease pain and discomfort.   Place ice packs on the hemorrhoids if they are tender and swollen. Using ice packs between sitz baths may be helpful.   Put ice in a plastic bag.   Place a towel between your skin and the bag.   Leave the ice on for 15-20 minutes, 3-4 times a day.   Do not use a donut-shaped pillow or sit on the toilet for long periods. This increases blood pooling and pain.  SEEK MEDICAL CARE IF:  You have increasing pain and swelling that is not controlled by treatment or medicine.  You have uncontrolled bleeding.  You have difficulty or you are unable to have a bowel movement.  You have pain or inflammation outside the area of the hemorrhoids. MAKE SURE YOU:  Understand these instructions.  Will watch your condition.  Will get help right away if you are not doing well or get worse. Document Released: 01/02/2000 Document Revised: 12/22/2011 Document Reviewed:  11/09/2011 [facility] Patient Information 2015 [place], [place]. This information is not intended to replace advice given to you by your health care provider. Make sure you discuss any questions you have with your health care provider.  Gastroesophageal Reflux Disease, Adult Gastroesophageal reflux disease (GERD) happens when acid from your stomach goes into your food pipe (esophagus). The acid can cause a burning feeling in your chest. Over time, the acid can make small holes (ulcers) in your food pipe.  HOME CARE  Ask your doctor for advice about:  Losing weight.  Quitting smoking.  Alcohol use.  Avoid foods and drinks that make your problems worse. You may want to avoid:  Caffeine and alcohol.  Chocolate.  Mints.  Garlic and onions.  Spicy foods.  Citrus fruits, such as oranges, lemons, or limes.  Foods that contain tomato, such as sauce, chili, salsa, and pizza.  Fried and fatty foods.  Avoid lying down for 3 hours before you go to bed or before you take a nap.  Eat small meals often, instead of large meals.  Wear loose-fitting clothing. Do not wear anything tight around your waist.  Raise (elevate) the head of your bed 6 to 8 inches with wood blocks. Using extra pillows does not help.  Only take medicines as told by your doctor.  Do not take aspirin or ibuprofen. GET HELP RIGHT AWAY IF:   You have pain in your arms, neck, jaw, teeth, or back.  Your pain gets worse or changes.  You feel sick to your stomach (nauseous), throw up (vomit), or sweat (diaphoresis).  You feel short of breath, or you pass out (faint).  Your throw up is green, yellow, black, or looks like coffee grounds or blood.  Your poop (stool) is red, bloody, or black. MAKE SURE YOU:   Understand these instructions.  Will watch your condition.  Will get help right away if you are not doing well or get worse. Document Released: 06/23/2007 Document Revised: 03/29/2011 Document Reviewed:  07/24/2010 [facility] Patient Information 2015 [place], [place]. This information is not intended to replace advice given to you by your health care provider. Make sure you discuss any questions you have with your health care provider.

## 2013-11-23 ENCOUNTER — Encounter (HOSPITAL_COMMUNITY): Payer: Self-pay | Admitting: Internal Medicine

## 2013-11-23 ENCOUNTER — Telehealth: Payer: Self-pay

## 2013-11-23 MED ORDER — HYDROCORTISONE 2.5 % RE CREA
1.0000 | TOPICAL_CREAM | Freq: Two times a day (BID) | RECTAL | Status: DC
Start: 2013-11-23 — End: 2014-02-15

## 2013-11-23 NOTE — Telephone Encounter (Signed)
pts sister called- RMR gave her an Rx for Anusol suppositories and they cost $150.00 and they want to know if we can send in something else, Insurance may cover a cream instead. Pt uses [facility] in [place].

## 2013-11-23 NOTE — Telephone Encounter (Signed)
done

## 2013-11-25 ENCOUNTER — Encounter: Payer: Self-pay | Admitting: Internal Medicine

## 2013-11-26 NOTE — Progress Notes (Signed)
Quick Note:  Elevated alk phos stable. Serologic markers negative for PBC. Given minimal elevation and recent CT liver with no signs of significant abnormalities, we will continue to monitor only.  Recheck LFTs, Hep B surf Ag, HCV Ab, Hep B surf Ab to be done in 4 months. ______

## 2013-11-28 ENCOUNTER — Other Ambulatory Visit: Payer: Self-pay

## 2013-11-28 DIAGNOSIS — R110 Nausea: Secondary | ICD-10-CM

## 2013-11-28 DIAGNOSIS — R748 Abnormal levels of other serum enzymes: Secondary | ICD-10-CM

## 2013-12-31 ENCOUNTER — Other Ambulatory Visit: Payer: Self-pay | Admitting: Family Medicine

## 2014-01-03 ENCOUNTER — Ambulatory Visit: Payer: Medicare Other | Admitting: Gastroenterology

## 2014-01-05 ENCOUNTER — Encounter (HOSPITAL_COMMUNITY): Payer: Self-pay | Admitting: Emergency Medicine

## 2014-01-05 ENCOUNTER — Emergency Department (HOSPITAL_COMMUNITY)
Admission: EM | Admit: 2014-01-05 | Discharge: 2014-01-05 | Disposition: A | Discharge status: Home / Self Care | Payer: Medicare Other | Attending: Emergency Medicine | Admitting: Emergency Medicine

## 2014-01-05 DIAGNOSIS — J45909 Unspecified asthma, uncomplicated: Secondary | ICD-10-CM | POA: Diagnosis not present

## 2014-01-05 DIAGNOSIS — Z87891 Personal history of nicotine dependence: Secondary | ICD-10-CM | POA: Insufficient documentation

## 2014-01-05 DIAGNOSIS — B001 Herpesviral vesicular dermatitis: Secondary | ICD-10-CM | POA: Insufficient documentation

## 2014-01-05 DIAGNOSIS — K219 Gastro-esophageal reflux disease without esophagitis: Secondary | ICD-10-CM | POA: Diagnosis not present

## 2014-01-05 DIAGNOSIS — F329 Major depressive disorder, single episode, unspecified: Secondary | ICD-10-CM | POA: Diagnosis not present

## 2014-01-05 DIAGNOSIS — M549 Dorsalgia, unspecified: Secondary | ICD-10-CM | POA: Diagnosis not present

## 2014-01-05 DIAGNOSIS — Z85118 Personal history of other malignant neoplasm of bronchus and lung: Secondary | ICD-10-CM | POA: Insufficient documentation

## 2014-01-05 DIAGNOSIS — E785 Hyperlipidemia, unspecified: Secondary | ICD-10-CM | POA: Insufficient documentation

## 2014-01-05 DIAGNOSIS — F419 Anxiety disorder, unspecified: Secondary | ICD-10-CM | POA: Diagnosis not present

## 2014-01-05 DIAGNOSIS — K1379 Other lesions of oral mucosa: Secondary | ICD-10-CM | POA: Diagnosis present

## 2014-01-05 DIAGNOSIS — G8929 Other chronic pain: Secondary | ICD-10-CM | POA: Insufficient documentation

## 2014-01-05 DIAGNOSIS — I10 Essential (primary) hypertension: Secondary | ICD-10-CM | POA: Insufficient documentation

## 2014-01-05 MED ORDER — KETOROLAC TROMETHAMINE 10 MG PO TABS
10.0000 mg | ORAL_TABLET | Freq: Once | ORAL | Status: DC
  Filled 2014-01-05: qty 1

## 2014-01-05 MED ORDER — HYDROCODONE-ACETAMINOPHEN 5-325 MG PO TABS
1.0000 | ORAL_TABLET | Freq: Once | ORAL | Status: DC
  Filled 2014-01-05: qty 1

## 2014-01-05 MED ORDER — HYDROCODONE-ACETAMINOPHEN 5-325 MG PO TABS: 1.0000 | ORAL_TABLET | ORAL | Status: DC | PRN

## 2014-01-05 MED ORDER — IBUPROFEN 600 MG PO TABS: 600.0000 mg | ORAL_TABLET | Freq: Four times a day (QID) | ORAL | Status: DC | PRN

## 2014-01-05 NOTE — ED Provider Notes (Signed)
CSN: [id number]     Arrival date & time 01/05/14  1013 History   First MD Initiated Contact with Patient 01/05/14 1019     No chief complaint on file.    (Consider location/radiation/quality/duration/timing/severity/associated sxs/prior Treatment) HPI Comments: Patient is a 53 year old female who presents to the emergency department with a blister on the lower lip. The patient states that this came up 4 or 5 days ago and has been getting progressively worse. The patient complains also of it getting progressively more painful. It is of note that the patient has genital herpes, and she is on acyclovir 400 mg. She is very concerned that the blister on her lip is related to her genital herpes. She is also very upset because her husband has introduced this illness into the relationship. The patient denies any high fever. She has not seen any red streaking involving her face. She has no problems with eating, drinking, or breathing. She is taking the acyclovir, but states it does not seem to be doing too much for the blister.  The history is provided by the patient.    Past Medical History  Diagnosis Date  . HTN (hypertension)   . HLD (hyperlipidemia)   . Osteoarthrosis involving more than one site but not generalized   . Depression   . GERD (gastroesophageal reflux disease)   . Asthma   . Chronic abdominal pain   . Chronic joint pain   . Chronic back pain   . Cancer 1997    left lung  . Palpitations 01/2013    chronic, intermittent  . Nausea and vomiting     chronic, recurrent   Past Surgical History  Procedure Laterality Date  . Tubal ligation    . Abdominal hysterectomy    . Abdominal surgery      ovarian cyst removal  . Lung cancer surgery  1997    age 19, resection only  . Bladder surgery  suspension x4    x 4  . Lobectomy Left   . Excision vaginal cyst Left 06/20/2012    Procedure: EXCISION LEFT LABIAL CYST;  Surgeon: Jonnie Kind, MD;  Location: [facility];  Service:  Gynecology;  Laterality: Left;  . Colonoscopy with propofol N/A 11/22/2013    RMR: Grade 3 and 4/internal hemorrhoids?"likely source of hematochezia Normal colonoscopy otherwise.  . Esophagogastroduodenoscopy (egd) with propofol N/A 11/22/2013    RMR: Incomplete Schatzki's ring dilated and disrupted as described above.  Small hiatal hernia. Focally abnormal gastric mucosa of uncertain significance?"status post biopsy  . Maloney dilation N/A 11/22/2013    Procedure: [name] DILATION;  Surgeon: Daneil Dolin, MD;  Location: [facility];  Service: Endoscopy;  Laterality: N/A;  56  . Esophageal biopsy N/A 11/22/2013    Procedure: GASTRIC BIOPSY;  Surgeon: Daneil Dolin, MD;  Location: [facility];  Service: Endoscopy;  Laterality: N/A;   Family History  Problem Relation Age of Onset  . Hypertension Mother   . Kidney disease Mother   . Hypertension Father   . Kidney disease Father   . Heart disease Father   . Cancer Father     leukemia  . Hypertension Sister   . Hypertension Sister   . Diabetes Other   . Heart disease Other     has Psychologist, forensic  . Colon cancer Neg Hx    History  Substance Use Topics  . Smoking status: Former Smoker -- 1.50 packs/day for 25 years    Types: Cigarettes    Quit  date: 04/07/1995  . Smokeless tobacco: Never Used  . Alcohol Use: No   OB History    No data available     Review of Systems  Constitutional: Negative for activity change.       All ROS Neg except as noted in HPI  HENT: Negative for nosebleeds.   Eyes: Negative for photophobia and discharge.  Respiratory: Negative for cough, shortness of breath and wheezing.   Cardiovascular: Positive for palpitations. Negative for chest pain.  Gastrointestinal: Negative for abdominal pain and blood in stool.  Genitourinary: Negative for dysuria, frequency and hematuria.  Musculoskeletal: Positive for back pain and arthralgias. Negative for neck pain.  Skin: Negative.   Neurological: Negative for dizziness,  seizures and speech difficulty.  Psychiatric/Behavioral: Negative for hallucinations and confusion.       Depression      Allergies  Promethazine hcl  Home Medications   Prior to Admission medications   Medication Sig Start Date End Date Taking? Authorizing Provider  acyclovir (ZOVIRAX) 400 MG tablet Take 1 tablet (400 mg total) by mouth 2 (two) times daily. 09/14/13   Timmothy Euler, MD  albuterol (PROAIR HFA) 108 (90 BASE) MCG/ACT inhaler Inhale 2 puffs into the lungs every 4 (four) hours as needed. for shortness of breath 11/22/12   Timmothy Euler, MD  amitriptyline (ELAVIL) 150 MG tablet TAKE ONE TABLET BY MOUTH ONCE DAILY AT BEDTIME 12/31/13   Timmothy Euler, MD  atorvastatin (LIPITOR) 40 MG tablet Take 1 tablet (40 mg total) by mouth daily. 02/15/13   Timmothy Euler, MD  Calcium Citrate-Vitamin D 250-200 MG-UNIT TABS Take 1 tablet by mouth daily.     Historical Provider, MD  DULoxetine (CYMBALTA) 60 MG capsule Take 1 capsule (60 mg total) by mouth 2 (two) times daily. 10/17/13   Timmothy Euler, MD  gabapentin (NEURONTIN) 300 MG capsule Take 1 capsule (300 mg total) by mouth 3 (three) times daily. 10/17/13   Timmothy Euler, MD  hydrochlorothiazide (HYDRODIURIL) 25 MG tablet Take 1 tablet (25 mg total) by mouth daily. 09/14/13   Timmothy Euler, MD  hydrocortisone (ANUSOL-HC) 2.5 % rectal cream Place 1 application rectally 2 (two) times daily. 11/23/13   Mahala Menghini, PA-C  ibuprofen (ADVIL,MOTRIN) 600 MG tablet Take 1 tablet (600 mg total) by mouth every 8 (eight) hours as needed. Do not take with mobic 10/17/13   Timmothy Euler, MD  Linaclotide [facility]) 290 MCG CAPS capsule Take 1 capsule (290 mcg total) by mouth daily. 11/06/13   Mahala Menghini, PA-C  mineral oil enema Place 1 enema rectally once as needed for mild constipation or severe constipation.    Historical Provider, MD  ondansetron (ZOFRAN) 4 MG tablet Take 1 tablet (4 mg total) by mouth every 8 (eight)  hours as needed for nausea or vomiting. 10/31/13   Francine Graven, DO  traZODone (DESYREL) 50 MG tablet Take 150 mg by mouth at bedtime.   10/14/10   Luetta Nutting, DO   There were no vitals taken for this visit. Physical Exam  Constitutional: She is oriented to person, place, and time. She appears well-developed and well-nourished.  Non-toxic appearance.  HENT:  Head: Normocephalic.    Right Ear: Tympanic membrane and external ear normal.  Left Ear: Tympanic membrane and external ear normal.  No lesions or ulcers of the tongue or gums. Airway patent.  Eyes: EOM and lids are normal. Pupils are equal, round, and reactive to light.  Neck: Normal  range of motion. Neck supple. Carotid bruit is not present.  Cardiovascular: Normal rate, regular rhythm, normal heart sounds, intact distal pulses and normal pulses.   Pulmonary/Chest: Breath sounds normal. No respiratory distress.  Abdominal: Soft. Bowel sounds are normal. There is no tenderness. There is no guarding.  Musculoskeletal: Normal range of motion.  Lymphadenopathy:       Head (right side): No submandibular adenopathy present.       Head (left side): No submandibular adenopathy present.    She has no cervical adenopathy.  Neurological: She is alert and oriented to person, place, and time. She has normal strength. No cranial nerve deficit or sensory deficit.  Skin: Skin is warm and dry.  Psychiatric: Her speech is normal. Her mood appears anxious.  Pt tearful during exam. Pt admits she is more upset about her husband bringing herpes into the relationship. She is fearful that the genital herpes has spread to the lip and face.  Nursing note and vitals reviewed.   ED Course  Procedures (including critical care time) Labs Review Labs Reviewed - No data to display  Imaging Review No results found.   EKG Interpretation None      MDM  The examination favors herpes simplex 1. I discussed herpes simplex 1 and 2 with patient in  terms which he understands. Patient acknowledges that she is mostly concerned about the genital herpes now involving her lip or face. The patient is currently on acyclovir. I have given the patient instructions on handwashing, over-the-counter cold sore remedies, and use of Orajel. I've given the patient a prescription for ibuprofen and Norco to assist with her discomfort. Patient is to follow with her primary physician to assist with the anxiety of this problem. Patient is to return to the emergency department if any signs of cellulitis or infection, or any emergent related changes.    Final diagnoses:  Fever blister    **I have reviewed nursing notes, vital signs, and all appropriate lab and imaging results for this patient.Lenox Ahr, PA-C 01/05/14 1139  Mariea Clonts, MD 01/06/14 [phone]

## 2014-01-05 NOTE — ED Notes (Signed)
Patient c/o blistering to lips that started on Monday and is progressively getting worse. Per patient extremely painful. Patient reports that she has been taking Acyclovir 400mg  for genital. Per patient used to take Valtrex but was placed on Acylovir bid 2 months ago.

## 2014-01-05 NOTE — ED Notes (Signed)
Pt returned for Rx. States she was sorry she had to go or her ride would leave her

## 2014-01-05 NOTE — Discharge Instructions (Signed)
You have a cold sore or fever blister on the lip. At this time there is no involvement of the inner lip or gum. Please see your primary physician, or return to the emergency department if any unusual redness swelling of the face, or abscess of the lip or gum. You may continue your current medications. Please use ibuprofen 600 mg every 6 hours as needed. May use Norco for more severe pain if needed. This medication may cause drowsiness, please use with caution. Please wash hands frequently. Please keep your distance until this has resolved. Cold sore medications and Orajel may also be helpful. Cold Sore A cold sore (fever blister) is a skin infection caused by a certain type of germ (virus). They are small sores filled with fluid that dry up and heal within 2 weeks. Cold sores form inside of the mouth or on the lips, gums, and other parts of the body. Cold sores can be easily passed (contagious) to other people. This can happen through close personal contact, such as kissing or sharing a drinking glass. HOME CARE  Only take medicine as told by your doctor. Do not use aspirin.  Use a cotton-tip swab to put creams or gels on your sores.  Do not touch sores or pick scabs. Wash your hands often. Do not touch your eyes without washing your hands first.  Avoid kissing, oral sex, and sharing personal items until the sores heal.  Put an ice pack on your sores for 10-15 minutes to ease discomfort.  Avoid hot, cold, or salty foods. Eat a soft, bland diet. Use a straw to drink if it helps lessen pain.  Keep sores clean and dry.  Avoid the sun and limit stress if these things cause you to have sores. Apply sunscreen on your lips if the sun causes cold sores. GET HELP IF:  You have a fever or lasting symptoms for more than 2-3 days.  You have a fever and your symptoms suddenly get worse.  You have yellow-white fluid (not clear) coming from the sores.  You have redness that is spreading.  You have  pain or irritation in your eye.  You get sores on your genitals.  Your sores do not heal within 2 weeks.  You have a tough time fighting off sickness and infections (weakened immune system).  You get cold sores often. MAKE SURE YOU:   Understand these instructions.  Will watch your condition.  Will get help right away if you are not doing well or get worse. Document Released: 07/06/2011 Document Reviewed: 07/06/2011 [facility] Patient Information 2015 [place]. This information is not intended to replace advice given to you by your health care provider. Make sure you discuss any questions you have with your health care provider.

## 2014-01-05 NOTE — ED Notes (Signed)
In to discharge pt. Pt not in room. Pt called via phone to come back for RX

## 2014-02-15 ENCOUNTER — Encounter: Payer: Self-pay | Admitting: Gastroenterology

## 2014-02-15 ENCOUNTER — Other Ambulatory Visit: Payer: Self-pay

## 2014-02-15 ENCOUNTER — Ambulatory Visit (INDEPENDENT_AMBULATORY_CARE_PROVIDER_SITE_OTHER): Payer: Medicare Other | Admitting: Gastroenterology

## 2014-02-15 VITALS — BP 120/55 | HR 84 | Temp 97.5°F | Ht 66.0 in | Wt 184.8 lb

## 2014-02-15 DIAGNOSIS — R1314 Dysphagia, pharyngoesophageal phase: Secondary | ICD-10-CM

## 2014-02-15 DIAGNOSIS — K5900 Constipation, unspecified: Secondary | ICD-10-CM

## 2014-02-15 DIAGNOSIS — R748 Abnormal levels of other serum enzymes: Secondary | ICD-10-CM

## 2014-02-15 DIAGNOSIS — R110 Nausea: Secondary | ICD-10-CM

## 2014-02-15 LAB — HEPATIC FUNCTION PANEL
ALT: 12 U/L (ref 0–35)
AST: 13 U/L (ref 0–37)
Albumin: 3.9 g/dL (ref 3.5–5.2)
Alkaline Phosphatase: 161 U/L — ABNORMAL HIGH (ref 39–117)
BILIRUBIN INDIRECT: 0.3 mg/dL (ref 0.2–1.2)
Bilirubin, Direct: 0.1 mg/dL (ref 0.0–0.3)
Total Bilirubin: 0.4 mg/dL (ref 0.2–1.2)
Total Protein: 6.7 g/dL (ref 6.0–8.3)

## 2014-02-15 LAB — HEPATITIS B SURFACE ANTIGEN: HEP B S AG: NEGATIVE

## 2014-02-15 LAB — HEPATITIS C ANTIBODY: HCV AB: NEGATIVE

## 2014-02-15 LAB — HEPATITIS B SURFACE ANTIBODY,QUALITATIVE: Hep B S Ab: NEGATIVE

## 2014-02-15 MED ORDER — LUBIPROSTONE 24 MCG PO CAPS: 24.0000 ug | ORAL_CAPSULE | Freq: Two times a day (BID) | ORAL | Status: DC

## 2014-02-15 NOTE — Progress Notes (Signed)
Referring Provider: Timmothy Euler, MD Primary Care Physician:  Kenn File, MD  Primary GI: Dr. Gala Romney   Chief Complaint  Patient presents with  . Follow-up  . Abdominal Pain    HPI:   [name] is a 54 y.o. female presenting today with a history of chronic constipation, GERD, dysphagia. Recent EGD/TCS. EGD with incomplete Schatzki's ring s/p dilatation, small hiatal hernia. Colonoscopy with grade 3 and 4 internal hemorrhoids, otherwise normal.   Nauseated, worse with eating. Dysphagia persists. Coughing up phlegm. Zofran scheduled dosing but no real improvement. Feels bloated. Keeps sweating. Throat feels dry. Dexilant daily.   Was taking Linzess but then stopped working. BM this morning, will go a few days without a BM. Sometimes has to strain. Anusol cream too expensive.   Weight stable. History of chronically elevated alk phos. Serologic markers negative for PBC. Due for recheck LFTs, Hep B, Hep C, and Hep B Ab now.   Past Medical History  Diagnosis Date  . HTN (hypertension)   . HLD (hyperlipidemia)   . Osteoarthrosis involving more than one site but not generalized   . Depression   . GERD (gastroesophageal reflux disease)   . Asthma   . Chronic abdominal pain   . Chronic joint pain   . Chronic back pain   . Cancer 1997    left lung  . Palpitations 01/2013    chronic, intermittent  . Nausea and vomiting     chronic, recurrent    Past Surgical History  Procedure Laterality Date  . Tubal ligation    . Abdominal hysterectomy    . Abdominal surgery      ovarian cyst removal  . Lung cancer surgery  1997    age 79, resection only  . Bladder surgery  suspension x4    x 4  . Lobectomy Left   . Excision vaginal cyst Left 06/20/2012    Procedure: EXCISION LEFT LABIAL CYST;  Surgeon: Jonnie Kind, MD;  Location: [facility];  Service: Gynecology;  Laterality: Left;  . Colonoscopy with propofol N/A 11/22/2013    Dr. Gala Romney: Grade 3 and 4/internal  hemorrhoids?"likely source of hematochezia Normal colonoscopy otherwise.   . Esophagogastroduodenoscopy (egd) with propofol N/A 11/22/2013    Dr. Gala Romney: Incomplete Schatzki's ring dilated and disrupted as described above.  Small hiatal hernia. Focally abnormal gastric mucosa of uncertain significance status post biopsy, reactive gastropathy, negative H.pylori  . Maloney dilation N/A 11/22/2013    Procedure: [name] DILATION;  Surgeon: Daneil Dolin, MD;  Location: [facility];  Service: Endoscopy;  Laterality: N/A;  56  . Esophageal biopsy N/A 11/22/2013    Procedure: GASTRIC BIOPSY;  Surgeon: Daneil Dolin, MD;  Location: [facility];  Service: Endoscopy;  Laterality: N/A;  . Colonoscopy      Current Outpatient Prescriptions  Medication Sig Dispense Refill  . acyclovir (ZOVIRAX) 400 MG tablet Take 1 tablet (400 mg total) by mouth 2 (two) times daily. 60 tablet 6  . albuterol (PROAIR HFA) 108 (90 BASE) MCG/ACT inhaler Inhale 2 puffs into the lungs every 4 (four) hours as needed. for shortness of breath 1 each 2  . amitriptyline (ELAVIL) 150 MG tablet TAKE ONE TABLET BY MOUTH ONCE DAILY AT BEDTIME 30 tablet 3  . Calcium Citrate-Vitamin D 250-200 MG-UNIT TABS Take 1 tablet by mouth daily.     . cyclobenzaprine (FLEXERIL) 10 MG tablet Take 10 mg by mouth at bedtime as needed for muscle spasms.    Marland Kitchen  dexlansoprazole (DEXILANT) 60 MG capsule Take 60 mg by mouth daily.    . DULoxetine (CYMBALTA) 60 MG capsule Take 1 capsule (60 mg total) by mouth 2 (two) times daily. 120 capsule 11  . gabapentin (NEURONTIN) 300 MG capsule Take 1 capsule (300 mg total) by mouth 3 (three) times daily. 90 capsule 11  . hydrochlorothiazide (HYDRODIURIL) 25 MG tablet Take 1 tablet (25 mg total) by mouth daily. 90 tablet 3  . ibuprofen (ADVIL,MOTRIN) 600 MG tablet Take 1 tablet (600 mg total) by mouth every 8 (eight) hours as needed. Do not take with mobic 30 tablet 0  . mineral oil enema Place 1 enema rectally once as needed for  mild constipation or severe constipation.    . ondansetron (ZOFRAN) 4 MG tablet Take 1 tablet (4 mg total) by mouth every 8 (eight) hours as needed for nausea or vomiting. 6 tablet 0  . atorvastatin (LIPITOR) 40 MG tablet TAKE ONE TABLET BY MOUTH ONCE DAILY 30 tablet 11  . lubiprostone (AMITIZA) 24 MCG capsule Take 1 capsule (24 mcg total) by mouth 2 (two) times daily with a meal. 60 capsule 3   No current facility-administered medications for this visit.    Allergies as of 02/15/2014 - Review Complete 02/15/2014  Allergen Reaction Noted  . Promethazine hcl Nausea And Vomiting and Other (See Comments) 07/26/2008    Family History  Problem Relation Age of Onset  . Hypertension Mother   . Kidney disease Mother   . Hypertension Father   . Kidney disease Father   . Heart disease Father   . Cancer Father     leukemia  . Hypertension Sister   . Hypertension Sister   . Diabetes Other   . Heart disease Other     has Psychologist, forensic  . Colon cancer Neg Hx     History   Social History  . Marital Status: Married    Spouse Name: N/A    Number of Children: 3  . Years of Education: N/A   Social History Main Topics  . Smoking status: Former Smoker -- 1.50 packs/day for 25 years    Types: Cigarettes    Quit date: 04/07/1995  . Smokeless tobacco: Never Used  . Alcohol Use: No  . Drug Use: No  . Sexual Activity: Not Currently    Birth Control/ Protection: Surgical   Other Topics Concern  . None   Social History Narrative    Review of Systems: As mentioned in HPI.   Physical Exam: BP 120/55 mmHg  Pulse 84  Temp(Src) 97.5 F (36.4 C) (Oral)  Ht _0  (1.676 m)  Wt 184 lb 12.8 oz (83.825 kg)  BMI 29.84 kg/m2 General:   Alert and oriented. No distress noted. Pleasant and cooperative.  Head:  Normocephalic and atraumatic. Heart:  S1, S2 present without murmurs, rubs, or gallops. Regular rate and rhythm. Abdomen:  +BS, soft, moderately TTP diffusely without rebound or guarding.   No HSM or masses noted. Extremities:  Without edema. Neurologic:  Alert and  oriented x4;  grossly normal neurologically. Skin:  Intact without significant lesions or rashes. Psych:  Alert and cooperative. Normal mood and affect.

## 2014-02-15 NOTE — Patient Instructions (Signed)
I have ordered an xray swallow study and a gastric emptying study.  For constipation: start Amitiza 24 mcg one capsule WITH MEALS to avoid nausea (twice a day). I have provided samples and a prescription.   Please complete the blood work, which we are doing because of your liver numbers.

## 2014-02-18 ENCOUNTER — Encounter (HOSPITAL_COMMUNITY)
Admission: RE | Admit: 2014-02-18 | Discharge: 2014-02-18 | Disposition: A | Discharge status: Home / Self Care | Payer: Medicare Other | Source: Ambulatory Visit | Attending: Gastroenterology | Admitting: Gastroenterology

## 2014-02-18 ENCOUNTER — Other Ambulatory Visit: Payer: Self-pay | Admitting: Family Medicine

## 2014-02-18 ENCOUNTER — Encounter (HOSPITAL_COMMUNITY): Payer: Self-pay

## 2014-02-18 DIAGNOSIS — R748 Abnormal levels of other serum enzymes: Secondary | ICD-10-CM

## 2014-02-18 DIAGNOSIS — R109 Unspecified abdominal pain: Secondary | ICD-10-CM | POA: Diagnosis not present

## 2014-02-18 DIAGNOSIS — R1314 Dysphagia, pharyngoesophageal phase: Secondary | ICD-10-CM

## 2014-02-18 DIAGNOSIS — K5900 Constipation, unspecified: Secondary | ICD-10-CM

## 2014-02-18 DIAGNOSIS — R110 Nausea: Secondary | ICD-10-CM

## 2014-02-18 MED ORDER — TECHNETIUM TC 99M SULFUR COLLOID
2.0000 | Freq: Once | INTRAVENOUS | Status: AC | PRN
Start: 2014-02-18 — End: 2014-02-18
  Administered 2014-02-18: 2 via ORAL

## 2014-02-19 ENCOUNTER — Ambulatory Visit (HOSPITAL_COMMUNITY)
Admission: RE | Admit: 2014-02-19 | Discharge: 2014-02-19 | Disposition: A | Discharge status: Home / Self Care | Payer: Medicare Other | Source: Ambulatory Visit | Attending: Gastroenterology | Admitting: Gastroenterology

## 2014-02-19 DIAGNOSIS — R1310 Dysphagia, unspecified: Secondary | ICD-10-CM | POA: Diagnosis not present

## 2014-02-19 DIAGNOSIS — K5900 Constipation, unspecified: Secondary | ICD-10-CM

## 2014-02-19 DIAGNOSIS — R1314 Dysphagia, pharyngoesophageal phase: Secondary | ICD-10-CM | POA: Diagnosis not present

## 2014-02-19 DIAGNOSIS — R748 Abnormal levels of other serum enzymes: Secondary | ICD-10-CM

## 2014-02-19 DIAGNOSIS — R110 Nausea: Secondary | ICD-10-CM

## 2014-02-20 ENCOUNTER — Encounter: Payer: Self-pay | Admitting: Gastroenterology

## 2014-02-20 NOTE — Assessment & Plan Note (Signed)
Failing Linzess, stating ineffective now. Start Amitiza 24 mcg po BID. Samples provided.

## 2014-02-20 NOTE — Assessment & Plan Note (Signed)
Persistent dysphagia despite dilatation at time of EGD. Query underlying motility disorder. Will continue Dexilant daily and proceed with BPE.

## 2014-02-20 NOTE — Assessment & Plan Note (Signed)
With negative markers for PBC, due for recheck. Check further labs to include Hep BsAg, HCV ab, Hep B sAb. Further recommendations to follow.

## 2014-02-20 NOTE — Assessment & Plan Note (Signed)
Persistent nausea without vomiting, no improvement with PPI and Zofran. Proceed with GES. Weight is stable.

## 2014-02-22 ENCOUNTER — Other Ambulatory Visit: Payer: Self-pay | Admitting: Family Medicine

## 2014-02-26 ENCOUNTER — Telehealth: Payer: Self-pay | Admitting: Internal Medicine

## 2014-02-26 NOTE — Telephone Encounter (Signed)
Routing to AS. Pt has had imaging done and blood work.

## 2014-02-26 NOTE — Telephone Encounter (Signed)
PATIENT CALLED STATING THAT SHE IS STILL HAVING NAUSEA AND BLOATING.  PLEASE ADVISE.

## 2014-02-26 NOTE — Telephone Encounter (Signed)
GES normal. BPE normal. Labs normal to include negative Hep C. Isolated mild elevation of alk phos, chronic. Has she had improvement with Amitiza? Make sure she is taking it with food twice a day. Take Zofran scheduled. She is allergic to Phenergan.   No US abdomen on file recently. Let's proceed with that. May need HIDA> will assess for occult biliary etiology.

## 2014-02-27 ENCOUNTER — Other Ambulatory Visit: Payer: Self-pay

## 2014-02-27 DIAGNOSIS — R140 Abdominal distension (gaseous): Secondary | ICD-10-CM

## 2014-02-27 DIAGNOSIS — R110 Nausea: Secondary | ICD-10-CM

## 2014-02-27 NOTE — Telephone Encounter (Signed)
Continue Amitiza 24 mcg po BID. Add Miralax daily to BID as needed in addition. Needs [place] of abdomen.

## 2014-02-27 NOTE — Telephone Encounter (Signed)
Pt set up for 03/04/2014 @ 845am. Pt aware of test and that she needs to be fasting

## 2014-02-27 NOTE — Telephone Encounter (Signed)
Pt is aware of her results. Ok to schedule U/S.  Candy, please schedule.  Vicente Males, pt said she is taking the [place] as directed. She is still bloated and only having a small bm about every 2 days.

## 2014-03-04 ENCOUNTER — Ambulatory Visit (HOSPITAL_COMMUNITY): Payer: Medicare Other

## 2014-03-05 NOTE — Progress Notes (Signed)
cc'ed to pcp °

## 2014-03-11 ENCOUNTER — Ambulatory Visit (HOSPITAL_COMMUNITY)
Admission: RE | Admit: 2014-03-11 | Discharge: 2014-03-11 | Disposition: A | Discharge status: Home / Self Care | Payer: Medicare Other | Source: Ambulatory Visit | Attending: Gastroenterology | Admitting: Gastroenterology

## 2014-03-11 ENCOUNTER — Other Ambulatory Visit: Payer: Self-pay

## 2014-03-11 DIAGNOSIS — K7689 Other specified diseases of liver: Secondary | ICD-10-CM | POA: Diagnosis not present

## 2014-03-11 DIAGNOSIS — R110 Nausea: Secondary | ICD-10-CM | POA: Diagnosis not present

## 2014-03-11 DIAGNOSIS — R748 Abnormal levels of other serum enzymes: Secondary | ICD-10-CM

## 2014-03-11 DIAGNOSIS — R140 Abdominal distension (gaseous): Secondary | ICD-10-CM

## 2014-03-11 DIAGNOSIS — K760 Fatty (change of) liver, not elsewhere classified: Secondary | ICD-10-CM | POA: Insufficient documentation

## 2014-03-15 ENCOUNTER — Telehealth: Payer: Self-pay | Admitting: Internal Medicine

## 2014-03-15 NOTE — Telephone Encounter (Signed)
PATIENT CALLED INQUIRING ABOUT ULTRASOUND RESULTS  PLEASE ADVISE

## 2014-03-15 NOTE — Telephone Encounter (Signed)
Routing to AS for results.  

## 2014-03-18 NOTE — Progress Notes (Signed)
Quick Note:  Fatty liver.  Proceed with HIDA. ______

## 2014-03-18 NOTE — Telephone Encounter (Signed)
Pt is aware of her u/s results.

## 2014-03-18 NOTE — Telephone Encounter (Signed)
See result note.  

## 2014-03-19 ENCOUNTER — Encounter: Payer: Self-pay | Admitting: Family Medicine

## 2014-03-19 ENCOUNTER — Ambulatory Visit: Payer: Medicare Other | Admitting: Family Medicine

## 2014-03-19 ENCOUNTER — Ambulatory Visit (INDEPENDENT_AMBULATORY_CARE_PROVIDER_SITE_OTHER): Payer: Medicare Other | Admitting: Family Medicine

## 2014-03-19 ENCOUNTER — Other Ambulatory Visit: Payer: Self-pay

## 2014-03-19 VITALS — BP 143/63 | HR 81 | Temp 98.4°F | Ht 66.0 in | Wt 180.4 lb

## 2014-03-19 DIAGNOSIS — Z0000 Encounter for general adult medical examination without abnormal findings: Secondary | ICD-10-CM

## 2014-03-19 DIAGNOSIS — L0591 Pilonidal cyst without abscess: Secondary | ICD-10-CM | POA: Diagnosis not present

## 2014-03-19 DIAGNOSIS — R110 Nausea: Secondary | ICD-10-CM

## 2014-03-19 MED ORDER — AMOXICILLIN-POT CLAVULANATE 875-125 MG PO TABS: 1.0000 | ORAL_TABLET | Freq: Two times a day (BID) | ORAL | Status: DC

## 2014-03-19 NOTE — Patient Instructions (Signed)
Pilonidal Cyst A pilonidal cyst occurs when hairs get trapped (ingrown) beneath the skin in the crease between the buttocks over your sacrum (the bone under that crease). Pilonidal cysts are most common in young men with a lot of body hair. When the cyst is ruptured (breaks) or leaking, fluid from the cyst may cause burning and itching. If the cyst becomes infected, it causes a painful swelling filled with pus (abscess). The pus and trapped hairs need to be removed (often by lancing) so that the infection can heal. However, recurrence is common and an operation may be needed to remove the cyst. HOME CARE INSTRUCTIONS   If the cyst was NOT INFECTED:  Keep the area clean and dry. Bathe or shower daily. Wash the area well with a germ-killing soap. Warm tub baths may help prevent infection and help with drainage. Dry the area well with a towel.  Avoid tight clothing to keep area as moisture free as possible.  Keep area between buttocks as free of hair as possible. A depilatory may be used.  If the cyst WAS INFECTED and needed to be drained:  Your caregiver packed the wound with gauze to keep the wound open. This allows the wound to heal from the inside outwards and continue draining.  Return for a wound check in 1 day or as suggested.  If you take tub baths or showers, repack the wound with gauze following them. Sponge baths (at the sink) are a good alternative.  If an antibiotic was ordered to fight the infection, take as directed.  Only take over-the-counter or prescription medicines for pain, discomfort, or fever as directed by your caregiver.  After the drain is removed, use sitz baths for 20 minutes 4 times per day. Clean the wound gently with mild unscented soap, pat dry, and then apply a dry dressing. SEEK MEDICAL CARE IF:   You have increased pain, swelling, redness, drainage, or bleeding from the area.  You have a fever.  You have muscles aches, dizziness, or a general ill  feeling. Document Released: 01/02/2000 Document Revised: 03/29/2011 Document Reviewed: 03/01/2008 [facility] Patient Information 2015 [place], [place]. This information is not intended to replace advice given to you by your health care provider. Make sure you discuss any questions you have with your health care provider.

## 2014-03-19 NOTE — Assessment & Plan Note (Signed)
Location and presentation consistent with pilonidal cyst, not large and inflamed today so that I think it needs to be lanced, We'll cover her with Augmentin 10 days while she gets a surgery appointment set up Refer to surgery

## 2014-03-19 NOTE — Assessment & Plan Note (Signed)
Recommended mammogram today, given information.

## 2014-03-19 NOTE — Progress Notes (Signed)
Patient ID: [name], female   DOB: 12-03-1960, 54 y.o.   MRN: [id number]   HPI  Patient presents today for painful bumps on her buttock  Patient explains that she's had some small bumps in her coccyx area for several months, over the last 2-3 days they started getting much larger and more painful without any inciting event. She denies current fevers, chills, decreased appetite, dyspnea, or chest pain. She has never had a problem with bumps in this area before. She states that it's painful to sit down and that they're very and comfortable they seem to be getting worse.  She would like to see a surgeon in [place] if possible.   Smoking status noted-Former smoker  ROS: Per HPI  Objective: BP 143/63 mmHg  Pulse 81  Temp(Src) 98.4 F (36.9 C) (Oral)  Ht 5\' 6"  (1.676 m)  Wt 180 lb 6.4 oz (81.829 kg)  BMI 29.13 kg/m2 Gen: NAD, alert, cooperative with exam HEENT: NCAT CV: RRR, good S1/S2, no murmur Resp: CTABL, no wheezes, non-labored Skin: 2 small subcutaneous nodules at the superior end of the gluteal cleft, the distal nodules approximately pea-sized soft and fluctuant with tenderness to palpation, the superior nodule is approximately blueberry size and very firm, so firm it feels like bone, with slight tenderness to palpation.  Assessment and plan:  Pilonidal cyst Location and presentation consistent with pilonidal cyst, not large and inflamed today so that I think it needs to be lanced, We'll cover her with Augmentin 10 days while she gets a surgery appointment set up Refer to surgery    Healthcare maintenance Recommended mammogram today, given information.    Orders Placed This Encounter  Procedures  . Ambulatory referral to General Surgery    Referral Priority:  Routine    Referral Type:  Surgical    Referral Reason:  Specialty Services Required    Requested Specialty:  General Surgery    Number of Visits Requested:  1    Meds ordered this encounter    Medications  . amoxicillin-clavulanate (AUGMENTIN) 875-125 MG per tablet    Sig: Take 1 tablet by mouth 2 (two) times daily.    Dispense:  20 tablet    Refill:  0

## 2014-03-20 ENCOUNTER — Other Ambulatory Visit: Payer: Self-pay

## 2014-03-20 ENCOUNTER — Telehealth: Payer: Self-pay | Admitting: Family Medicine

## 2014-03-20 MED ORDER — DEXLANSOPRAZOLE 60 MG PO CPDR: 60.0000 mg | DELAYED_RELEASE_CAPSULE | Freq: Every day | ORAL | Status: DC

## 2014-03-20 NOTE — Telephone Encounter (Signed)
Patient informed that test was ordered by [facility], and that she should call there for any details about appointment.

## 2014-03-20 NOTE — Telephone Encounter (Signed)
Pt called and would like to know why she is going to [facility] in the morning. jw

## 2014-03-21 ENCOUNTER — Encounter (HOSPITAL_COMMUNITY): Payer: Self-pay

## 2014-03-21 ENCOUNTER — Encounter (HOSPITAL_COMMUNITY)
Admission: RE | Admit: 2014-03-21 | Discharge: 2014-03-21 | Disposition: A | Discharge status: Home / Self Care | Payer: Medicare Other | Source: Ambulatory Visit | Attending: Gastroenterology | Admitting: Gastroenterology

## 2014-03-21 DIAGNOSIS — R109 Unspecified abdominal pain: Secondary | ICD-10-CM | POA: Diagnosis not present

## 2014-03-21 DIAGNOSIS — R110 Nausea: Secondary | ICD-10-CM | POA: Diagnosis not present

## 2014-03-21 MED ORDER — TECHNETIUM TC 99M MEBROFENIN IV KIT
5.0000 | PACK | Freq: Once | INTRAVENOUS | Status: AC | PRN
  Administered 2014-03-21: 5 via INTRAVENOUS

## 2014-03-21 MED ORDER — STERILE WATER FOR INJECTION IJ SOLN
INTRAMUSCULAR | Status: AC
  Administered 2014-03-21: 1.64 mL via INTRAVENOUS
  Filled 2014-03-21: qty 10

## 2014-03-21 MED ORDER — SODIUM CHLORIDE 0.9 % IJ SOLN
INTRAMUSCULAR | Status: AC
  Filled 2014-03-21: qty 18

## 2014-03-21 MED ORDER — SINCALIDE 5 MCG IJ SOLR
INTRAMUSCULAR | Status: AC
  Administered 2014-03-21: 1.64 ug via INTRAVENOUS
  Filled 2014-03-21: qty 5

## 2014-03-22 DIAGNOSIS — R748 Abnormal levels of other serum enzymes: Secondary | ICD-10-CM | POA: Diagnosis not present

## 2014-03-22 DIAGNOSIS — R110 Nausea: Secondary | ICD-10-CM | POA: Diagnosis not present

## 2014-03-22 LAB — HEPATITIS C ANTIBODY: HCV Ab: NEGATIVE

## 2014-03-22 LAB — HEPATIC FUNCTION PANEL
ALBUMIN: 3.9 g/dL (ref 3.5–5.2)
ALT: 16 U/L (ref 0–35)
AST: 15 U/L (ref 0–37)
Alkaline Phosphatase: 160 U/L — ABNORMAL HIGH (ref 39–117)
BILIRUBIN INDIRECT: 0.3 mg/dL (ref 0.2–1.2)
Bilirubin, Direct: 0.1 mg/dL (ref 0.0–0.3)
TOTAL PROTEIN: 6.9 g/dL (ref 6.0–8.3)
Total Bilirubin: 0.4 mg/dL (ref 0.2–1.2)

## 2014-03-22 LAB — HEPATITIS B SURFACE ANTIGEN: Hepatitis B Surface Ag: NEGATIVE

## 2014-03-22 LAB — HEPATITIS B SURFACE ANTIBODY,QUALITATIVE: Hep B S Ab: NEGATIVE

## 2014-03-25 NOTE — Progress Notes (Signed)
Quick Note:  Patient DID experience symptoms with CCK. Normal HIDA, but the EF is on the lower end of normal. If she is having pain with eating, nausea or vomiting with eating, could refer electively for consideration of cholecystectomy. However, she needs to get constipation under control to see if this makes any difference. How is she doing? ______

## 2014-03-25 NOTE — Progress Notes (Signed)
Quick Note:  Viral markers negative. Alk Phos remains stable. Recheck HFP in 3 months. ______

## 2014-03-28 ENCOUNTER — Telehealth: Payer: Self-pay | Admitting: General Practice

## 2014-03-28 NOTE — Telephone Encounter (Signed)
Patient called and wanted to know if her results were back.  I told her that I would have Almyra Free give her a call in the morning.  Almyra Free, please call patient at [phone] with results.  Routing to [employer]

## 2014-03-29 ENCOUNTER — Other Ambulatory Visit: Payer: Self-pay | Admitting: Gastroenterology

## 2014-03-29 ENCOUNTER — Other Ambulatory Visit: Payer: Self-pay

## 2014-03-29 DIAGNOSIS — R1314 Dysphagia, pharyngoesophageal phase: Secondary | ICD-10-CM

## 2014-03-29 DIAGNOSIS — R109 Unspecified abdominal pain: Secondary | ICD-10-CM

## 2014-03-29 DIAGNOSIS — R110 Nausea: Secondary | ICD-10-CM

## 2014-03-29 DIAGNOSIS — R634 Abnormal weight loss: Secondary | ICD-10-CM

## 2014-03-29 DIAGNOSIS — R748 Abnormal levels of other serum enzymes: Secondary | ICD-10-CM

## 2014-03-29 NOTE — Telephone Encounter (Signed)
I spoke with the pt.  

## 2014-04-02 DIAGNOSIS — K811 Chronic cholecystitis: Secondary | ICD-10-CM | POA: Diagnosis not present

## 2014-04-02 NOTE — Patient Instructions (Signed)
[name]  04/02/2014   Your procedure is scheduled on:  04/05/2014  Report to [facility] at 7:45 AM.  Call this number if you have problems the morning of surgery: [phone]   Remember:   Do not eat food or drink liquids after midnight.   Take these medicines the morning of surgery with A SIP OF WATER: Albuterol (bring with you), Flexeril, Dexilant, Gabapentin, Amitiza  Zofran   Do not wear jewelry, make-up or nail polish.  Do not wear lotions, powders, or perfumes. You may wear deodorant.  Do not shave 48 hours prior to surgery. Men may shave face and neck.  Do not bring valuables to the hospital.  [facility] is not responsible for any belongings or valuables.               Contacts, dentures or bridgework may not be worn into surgery.  Leave suitcase in the car. After surgery it may be brought to your room.  For patients admitted to the hospital, discharge time is determined by your treatment team.               Patients discharged the day of surgery will not be allowed to drive home.  Name and phone number of your driver:   Special Instructions: Shower using CHG 2 nights before surgery and the night before surgery.  If you shower the day of surgery use CHG.  Use special wash - you have one bottle of CHG for all showers.  You should use approximately 1/3 of the bottle for each shower. N/A   Please read over the following fact sheets that you were given: Surgical Site Infection Prevention and Anesthesia Post-op Instructions   PATIENT INSTRUCTIONS POST-ANESTHESIA  IMMEDIATELY FOLLOWING SURGERY:  Do not drive or operate machinery for the first twenty four hours after surgery.  Do not make any important decisions for twenty four hours after surgery or while taking narcotic pain medications or sedatives.  If you develop intractable nausea and vomiting or a severe headache please notify your doctor immediately.  FOLLOW-UP:  Please make an appointment with your surgeon as  instructed. You do not need to follow up with anesthesia unless specifically instructed to do so.  WOUND CARE INSTRUCTIONS (if applicable):  Keep a dry clean dressing on the anesthesia/puncture wound site if there is drainage.  Once the wound has quit draining you may leave it open to air.  Generally you should leave the bandage intact for twenty four hours unless there is drainage.  If the epidural site drains for more than 36-48 hours please call the anesthesia department.  QUESTIONS?:  Please feel free to call your physician or the hospital operator if you have any questions, and they will be happy to assist you.      Laparoscopic Cholecystectomy Laparoscopic cholecystectomy is surgery to remove the gallbladder. The gallbladder is located in the upper right part of the abdomen, behind the liver. It is a storage sac for bile produced in the liver. Bile aids in the digestion and absorption of fats. Cholecystectomy is often done for inflammation of the gallbladder (cholecystitis). This condition is usually caused by a buildup of gallstones (cholelithiasis) in your gallbladder. Gallstones can block the flow of bile, resulting in inflammation and pain. In severe cases, emergency surgery may be required. When emergency surgery is not required, you will have time to prepare for the procedure. Laparoscopic surgery is an alternative to open surgery. Laparoscopic surgery has a shorter recovery time.  Your common bile duct may also need to be examined during the procedure. If stones are found in the common bile duct, they may be removed. LET [facility] CARE PROVIDER KNOW ABOUT:  Any allergies you have.  All medicines you are taking, including vitamins, herbs, eye drops, creams, and over-the-counter medicines.  Previous problems you or members of your family have had with the use of anesthetics.  Any blood disorders you have.  Previous surgeries you have had.  Medical conditions you have. RISKS AND  COMPLICATIONS Generally, this is a safe procedure. However, as with any procedure, complications can occur. Possible complications include:  Infection.  Damage to the common bile duct, nerves, arteries, veins, or other internal organs such as the stomach, liver, or intestines.  Bleeding.  A stone may remain in the common bile duct.  A bile leak from the cyst duct that is clipped when your gallbladder is removed.  The need to convert to open surgery, which requires a larger incision in the abdomen. This may be necessary if your surgeon thinks it is not safe to continue with a laparoscopic procedure. BEFORE THE PROCEDURE  Ask your health care provider about changing or stopping any regular medicines. You will need to stop taking aspirin or blood thinners at least 5 days prior to surgery.  Do not eat or drink anything after midnight the night before surgery.  Let your health care provider know if you develop a cold or other infectious problem before surgery. PROCEDURE   You will be given medicine to make you sleep through the procedure (general anesthetic). A breathing tube will be placed in your mouth.  When you are asleep, your surgeon will make several small cuts (incisions) in your abdomen.  A thin, lighted tube with a tiny camera on the end (laparoscope) is inserted through one of the small incisions. The camera on the laparoscope sends a picture to a TV screen in the operating room. This gives the surgeon a good view inside your abdomen.  A gas will be pumped into your abdomen. This expands your abdomen so that the surgeon has more room to perform the surgery.  Other tools needed for the procedure are inserted through the other incisions. The gallbladder is removed through one of the incisions.  After the removal of your gallbladder, the incisions will be closed with stitches, staples, or skin glue. AFTER THE PROCEDURE  You will be taken to a recovery area where your progress  will be checked often.  You may be allowed to go home the same day if your pain is controlled and you can tolerate liquids. Document Released: 01/04/2005 Document Revised: 10/25/2012 Document Reviewed: 08/16/2012 [facility] Patient Information 2015 [place], [place]. This information is not intended to replace advice given to you by your health care provider. Make sure you discuss any questions you have with your health care provider.

## 2014-04-02 NOTE — H&P (Signed)
  NTS SOAP Note  Vital Signs:  Vitals as of: 07/01/4313: Systolic 400: Diastolic 68: Heart Rate 81: Temp 97.76F: Height 5ft 6in: Weight 179Lbs 5 Ounces: Pain Level 8: BMI 28.94  BMI : 28.94 kg/m2  Subjective: This 54 year old female presents for of abdominal discomfort and nausea.  Has had persistent postprandial nausea and right upper quadrant abdominal pain for some time now.  Feels bloated.  U/S of gallbladder negative.  HIDA shows borderline ejection fraction with reproducible symptoms with CCK injection.  Has had extensive gi workup.  No fever,  chills,  jaundice.  Review of Symptoms:  Constitutional:fatigue Head:unremarkable Eyes:unremarkable   Nose/Mouth/Throat:unremarkable Cardiovascular:  unremarkable Respiratory:dyspnea Gastrointestinabdominal pain, nausea, heartburn, dyspepsia Genitourinary:unremarkable   Musculoskeletal:unremarkable Skin:unremarkable Hematolgic/Lymphatic:unremarkable   Allergic/Immunologic:unremarkable   Past Medical History:  Reviewed  Past Medical History  Surgical History: partial lung resection,  BTL,  bladder surgery,  pilonidal cyst excision,  TAH Medical Problems: lung cancer,  high cholesterol,  chronic pain Psychiatric History: anxiety/depression Allergies: phenergan Medications: gabapentin,  dexilant,  amitriptylline,  amitiza,  atorvastatin,  acyclovir,  duloxetine,  HCTZ,  proair   Social History:Reviewed  Social History  Preferred Language: English Race:  Black or African American Ethnicity: Not Hispanic / Latino Age: 20 year Marital Status:  S Alcohol: no   Smoking Status: Never smoker reviewed on 04/02/2014 Functional Status reviewed on 04/02/2014 ------------------------------------------------ Bathing: Normal Cooking: Normal Dressing: Normal Driving: Normal Eating: Normal Managing Meds: Normal Oral Care: Normal Shopping: Normal Toileting: Normal Transferring: Normal Walking: Normal Cognitive  Status reviewed on 04/02/2014 ------------------------------------------------ Attention: Normal Decision Making: Normal Language: Normal Memory: Normal Motor: Normal Perception: Normal Problem Solving: Normal Visual and Spatial: Normal   Family History:Reviewed  Family Health History Family History is Unknown    Objective Information: General:Well appearing, well nourished in no distress. Heart:RRR, no murmur or gallop.  Normal S1, S2.  No S3, S4.  Lungs:  CTA bilaterally, no wheezes, rhonchi, rales.  Breathing unlabored. Abdomen:Soft, mild discomfort to palpation in right upper quadrant,  ND, normal bowel sounds, no HSM, no masses.  No peritoneal signs.  Assessment:Chronic cholecystitis  Diagnoses: 575.11  K81.1 Chronic cholecystitis (Chronic cholecystitis)  Procedures: 86761 - OFFICE OUTPATIENT NEW 30 MINUTES    Plan:  Scheduled for laparoscopic cholecystectomy on 04/05/14.   Patient Education:Alternative treatments to surgery were discussed with patient (and family).  Risks and benefits  of procedure including bleeding,  infection,  hepatobiliary injury,  and the possibility of an open procedure were fully explained to the patient (and family) who gave informed consent. Patient/family questions were addressed.  Follow-up:Pending Surgery

## 2014-04-03 ENCOUNTER — Encounter (HOSPITAL_COMMUNITY): Payer: Self-pay

## 2014-04-03 ENCOUNTER — Encounter (HOSPITAL_COMMUNITY)
Admission: RE | Admit: 2014-04-03 | Discharge: 2014-04-03 | Disposition: A | Discharge status: Home / Self Care | Payer: Medicare Other | Source: Ambulatory Visit | Attending: General Surgery | Admitting: General Surgery

## 2014-04-03 DIAGNOSIS — Z902 Acquired absence of lung [part of]: Secondary | ICD-10-CM | POA: Diagnosis not present

## 2014-04-03 DIAGNOSIS — G4730 Sleep apnea, unspecified: Secondary | ICD-10-CM | POA: Diagnosis not present

## 2014-04-03 DIAGNOSIS — J45909 Unspecified asthma, uncomplicated: Secondary | ICD-10-CM | POA: Diagnosis not present

## 2014-04-03 DIAGNOSIS — K219 Gastro-esophageal reflux disease without esophagitis: Secondary | ICD-10-CM | POA: Diagnosis not present

## 2014-04-03 DIAGNOSIS — Z87891 Personal history of nicotine dependence: Secondary | ICD-10-CM | POA: Diagnosis not present

## 2014-04-03 DIAGNOSIS — K811 Chronic cholecystitis: Secondary | ICD-10-CM | POA: Diagnosis not present

## 2014-04-03 DIAGNOSIS — I10 Essential (primary) hypertension: Secondary | ICD-10-CM | POA: Diagnosis not present

## 2014-04-03 DIAGNOSIS — F329 Major depressive disorder, single episode, unspecified: Secondary | ICD-10-CM | POA: Diagnosis not present

## 2014-04-03 DIAGNOSIS — Z85118 Personal history of other malignant neoplasm of bronchus and lung: Secondary | ICD-10-CM | POA: Diagnosis not present

## 2014-04-03 LAB — BASIC METABOLIC PANEL
Anion gap: 9 (ref 5–15)
BUN: 7 mg/dL (ref 6–23)
CO2: 31 mmol/L (ref 19–32)
Calcium: 9.1 mg/dL (ref 8.4–10.5)
Chloride: 96 mmol/L (ref 96–112)
Creatinine, Ser: 0.78 mg/dL (ref 0.50–1.10)
GFR calc Af Amer: >90 mL/min (ref >90)
GFR calc non Af Amer: >90 mL/min (ref >90)
GLUCOSE: 97 mg/dL (ref 70–99)
POTASSIUM: 2.9 mmol/L — AB (ref 3.5–5.1)
Sodium: 136 mmol/L (ref 135–145)

## 2014-04-03 LAB — CBC WITH DIFFERENTIAL/PLATELET
BASOS ABS: 0 10*3/uL (ref 0.0–0.1)
Basophils Relative: 1 % (ref 0–1)
Eosinophils Absolute: 0.1 10*3/uL (ref 0.0–0.7)
Eosinophils Relative: 2 % (ref 0–5)
HEMATOCRIT: 38 % (ref 36.0–46.0)
Hemoglobin: 13 g/dL (ref 12.0–15.0)
LYMPHS PCT: 46 % (ref 12–46)
Lymphs Abs: 3 10*3/uL (ref 0.7–4.0)
MCH: 32 pg (ref 26.0–34.0)
MCHC: 34.2 g/dL (ref 30.0–36.0)
MCV: 93.6 fL (ref 78.0–100.0)
MONO ABS: 0.3 10*3/uL (ref 0.1–1.0)
MONOS PCT: 5 % (ref 3–12)
Neutro Abs: 2.9 10*3/uL (ref 1.7–7.7)
Neutrophils Relative %: 46 % (ref 43–77)
Platelets: 287 10*3/uL (ref 150–400)
RBC: 4.06 MIL/uL (ref 3.87–5.11)
RDW: 12.6 % (ref 11.5–15.5)
WBC: 6.4 10*3/uL (ref 4.0–10.5)

## 2014-04-03 LAB — HEPATIC FUNCTION PANEL
ALBUMIN: 3.8 g/dL (ref 3.5–5.2)
ALK PHOS: 158 U/L — AB (ref 39–117)
ALT: 27 U/L (ref 0–35)
AST: 21 U/L (ref 0–37)
BILIRUBIN INDIRECT: 0.4 mg/dL (ref 0.3–0.9)
BILIRUBIN TOTAL: 0.5 mg/dL (ref 0.3–1.2)
Bilirubin, Direct: 0.1 mg/dL (ref 0.0–0.5)
TOTAL PROTEIN: 7.2 g/dL (ref 6.0–8.3)

## 2014-04-03 NOTE — Pre-Procedure Instructions (Signed)
Potassium of 2.9 called to Dr Arnoldo Morale. Rx for K-Dur 20 mEq-6 tabs-tid called in to [facility] in [place]. Called patient and told that Rx was called in for her low potassium. Instructed her to eat bananas and to get 3 doses of potassium in today and tomorrow and to make sure she eats with meds. Instructed her we will recheck her blood work on Friday am and if is still low, we will have to cancel her surgery. She verbalizes understanding of all of the above.

## 2014-04-03 NOTE — Pre-Procedure Instructions (Signed)
Patient given information to sign up for my chart at home. 

## 2014-04-04 NOTE — Progress Notes (Signed)
On pre-op paperwork it said for pt to arrive to [facility] [place] on 04/05/14 for her surgery. Called pt to notify her to arrive at 0645 instead. Pt said she had already been told to come at 0645 day of PAT.

## 2014-04-04 NOTE — Pre-Procedure Instructions (Signed)
Dr Patsey Berthold aware of K+- 2.9 and that Rx was called in for patient. Will recheck potassium on arrival the morning of surgery.

## 2014-04-05 ENCOUNTER — Encounter (HOSPITAL_COMMUNITY): Payer: Self-pay | Admitting: *Deleted

## 2014-04-05 ENCOUNTER — Ambulatory Visit (HOSPITAL_COMMUNITY): Payer: Medicare Other | Admitting: Anesthesiology

## 2014-04-05 ENCOUNTER — Ambulatory Visit (HOSPITAL_COMMUNITY)
Admission: RE | Admit: 2014-04-05 | Discharge: 2014-04-05 | Disposition: A | Discharge status: Home / Self Care | Payer: Medicare Other | Source: Ambulatory Visit | Attending: General Surgery | Admitting: General Surgery

## 2014-04-05 ENCOUNTER — Encounter (HOSPITAL_COMMUNITY)
Admission: RE | Disposition: A | Discharge status: Home / Self Care | Payer: Self-pay | Source: Ambulatory Visit | Attending: General Surgery

## 2014-04-05 DIAGNOSIS — I10 Essential (primary) hypertension: Secondary | ICD-10-CM | POA: Insufficient documentation

## 2014-04-05 DIAGNOSIS — J45909 Unspecified asthma, uncomplicated: Secondary | ICD-10-CM | POA: Insufficient documentation

## 2014-04-05 DIAGNOSIS — G4730 Sleep apnea, unspecified: Secondary | ICD-10-CM | POA: Insufficient documentation

## 2014-04-05 DIAGNOSIS — Z87891 Personal history of nicotine dependence: Secondary | ICD-10-CM | POA: Diagnosis not present

## 2014-04-05 DIAGNOSIS — Z902 Acquired absence of lung [part of]: Secondary | ICD-10-CM | POA: Insufficient documentation

## 2014-04-05 DIAGNOSIS — F329 Major depressive disorder, single episode, unspecified: Secondary | ICD-10-CM | POA: Diagnosis not present

## 2014-04-05 DIAGNOSIS — Z85118 Personal history of other malignant neoplasm of bronchus and lung: Secondary | ICD-10-CM | POA: Insufficient documentation

## 2014-04-05 DIAGNOSIS — K811 Chronic cholecystitis: Secondary | ICD-10-CM | POA: Diagnosis not present

## 2014-04-05 DIAGNOSIS — K219 Gastro-esophageal reflux disease without esophagitis: Secondary | ICD-10-CM | POA: Insufficient documentation

## 2014-04-05 HISTORY — PX: CHOLECYSTECTOMY: SHX55

## 2014-04-05 LAB — POCT I-STAT 4, (NA,K, GLUC, HGB,HCT)
GLUCOSE: 109 mg/dL — AB (ref 70–99)
HCT: 41 % (ref 36.0–46.0)
Hemoglobin: 13.9 g/dL (ref 12.0–15.0)
POTASSIUM: 3.5 mmol/L (ref 3.5–5.1)
SODIUM: 142 mmol/L (ref 135–145)

## 2014-04-05 SURGERY — LAPAROSCOPIC CHOLECYSTECTOMY
Anesthesia: General | Site: Abdomen

## 2014-04-05 MED ORDER — HEMOSTATIC AGENTS (NO CHARGE) OPTIME
TOPICAL | Status: DC | PRN
  Administered 2014-04-05: 1 via TOPICAL

## 2014-04-05 MED ORDER — FENTANYL CITRATE 0.05 MG/ML IJ SOLN
INTRAMUSCULAR | Status: DC | PRN
  Administered 2014-04-05: 50 ug via INTRAVENOUS
  Administered 2014-04-05: 100 ug via INTRAVENOUS

## 2014-04-05 MED ORDER — ROCURONIUM BROMIDE 100 MG/10ML IV SOLN
INTRAVENOUS | Status: DC | PRN
  Administered 2014-04-05 (×2): 5 mg via INTRAVENOUS
  Administered 2014-04-05: 25 mg via INTRAVENOUS

## 2014-04-05 MED ORDER — GLYCOPYRROLATE 0.2 MG/ML IJ SOLN
INTRAMUSCULAR | Status: AC
  Filled 2014-04-05: qty 1

## 2014-04-05 MED ORDER — FENTANYL CITRATE 0.05 MG/ML IJ SOLN
25.0000 ug | INTRAMUSCULAR | Status: DC | PRN
  Administered 2014-04-05: 25 ug via INTRAVENOUS

## 2014-04-05 MED ORDER — GLYCOPYRROLATE 0.2 MG/ML IJ SOLN
INTRAMUSCULAR | Status: DC | PRN
  Administered 2014-04-05: .8 mg via INTRAVENOUS

## 2014-04-05 MED ORDER — ROCURONIUM BROMIDE 50 MG/5ML IV SOLN
INTRAVENOUS | Status: AC
  Filled 2014-04-05: qty 1

## 2014-04-05 MED ORDER — POVIDONE-IODINE 10 % EX OINT
TOPICAL_OINTMENT | CUTANEOUS | Status: AC
  Filled 2014-04-05: qty 1

## 2014-04-05 MED ORDER — BUPIVACAINE HCL (PF) 0.5 % IJ SOLN
INTRAMUSCULAR | Status: DC | PRN
  Administered 2014-04-05: 10 mL

## 2014-04-05 MED ORDER — GLYCOPYRROLATE 0.2 MG/ML IJ SOLN
INTRAMUSCULAR | Status: AC
  Filled 2014-04-05: qty 4

## 2014-04-05 MED ORDER — ONDANSETRON HCL 4 MG/2ML IJ SOLN
INTRAMUSCULAR | Status: AC
  Filled 2014-04-05: qty 2

## 2014-04-05 MED ORDER — SODIUM CHLORIDE 0.9 % IR SOLN
Status: DC | PRN
  Administered 2014-04-05: 1000 mL

## 2014-04-05 MED ORDER — MIDAZOLAM HCL 2 MG/2ML IJ SOLN
1.0000 mg | INTRAMUSCULAR | Status: DC | PRN
  Administered 2014-04-05: 2 mg via INTRAVENOUS

## 2014-04-05 MED ORDER — CIPROFLOXACIN IN D5W 400 MG/200ML IV SOLN
INTRAVENOUS | Status: AC
  Filled 2014-04-05: qty 200

## 2014-04-05 MED ORDER — KETOROLAC TROMETHAMINE 30 MG/ML IJ SOLN
30.0000 mg | Freq: Once | INTRAMUSCULAR | Status: AC
  Administered 2014-04-05: 30 mg via INTRAVENOUS
  Filled 2014-04-05: qty 1

## 2014-04-05 MED ORDER — LIDOCAINE HCL (CARDIAC) 10 MG/ML IV SOLN
INTRAVENOUS | Status: DC | PRN
  Administered 2014-04-05: 30 mg via INTRAVENOUS
  Administered 2014-04-05: 20 mg via INTRAVENOUS

## 2014-04-05 MED ORDER — GLYCOPYRROLATE 0.2 MG/ML IJ SOLN
0.2000 mg | Freq: Once | INTRAMUSCULAR | Status: AC
  Administered 2014-04-05: 0.2 mg via INTRAVENOUS

## 2014-04-05 MED ORDER — ONDANSETRON HCL 4 MG/2ML IJ SOLN
4.0000 mg | Freq: Once | INTRAMUSCULAR | Status: AC
  Administered 2014-04-05: 4 mg via INTRAVENOUS

## 2014-04-05 MED ORDER — CHLORHEXIDINE GLUCONATE 4 % EX LIQD: 1.0000 "application " | Freq: Once | CUTANEOUS | Status: DC

## 2014-04-05 MED ORDER — BUPIVACAINE HCL (PF) 0.5 % IJ SOLN
INTRAMUSCULAR | Status: AC
  Filled 2014-04-05: qty 30

## 2014-04-05 MED ORDER — NEOSTIGMINE METHYLSULFATE 10 MG/10ML IV SOLN
INTRAVENOUS | Status: AC
  Filled 2014-04-05: qty 1

## 2014-04-05 MED ORDER — PROPOFOL 10 MG/ML IV BOLUS
INTRAVENOUS | Status: AC
  Filled 2014-04-05: qty 20

## 2014-04-05 MED ORDER — DEXAMETHASONE SODIUM PHOSPHATE 4 MG/ML IJ SOLN
INTRAMUSCULAR | Status: AC
  Filled 2014-04-05: qty 1

## 2014-04-05 MED ORDER — LACTATED RINGERS IV SOLN: INTRAVENOUS | Status: DC

## 2014-04-05 MED ORDER — SUCCINYLCHOLINE CHLORIDE 20 MG/ML IJ SOLN
INTRAMUSCULAR | Status: AC
Start: 2014-04-05 — End: 2014-04-05
  Filled 2014-04-05: qty 1

## 2014-04-05 MED ORDER — FENTANYL CITRATE 0.05 MG/ML IJ SOLN
INTRAMUSCULAR | Status: AC
  Filled 2014-04-05: qty 2

## 2014-04-05 MED ORDER — LACTATED RINGERS IV SOLN
INTRAVENOUS | Status: DC
  Administered 2014-04-05: 08:00:00 via INTRAVENOUS

## 2014-04-05 MED ORDER — FENTANYL CITRATE 0.05 MG/ML IJ SOLN
INTRAMUSCULAR | Status: AC
Start: 2014-04-05 — End: 2014-04-05
  Filled 2014-04-05: qty 5

## 2014-04-05 MED ORDER — OXYCODONE-ACETAMINOPHEN 7.5-325 MG PO TABS: 1.0000 | ORAL_TABLET | ORAL | Status: DC | PRN

## 2014-04-05 MED ORDER — PROPOFOL 10 MG/ML IV BOLUS
INTRAVENOUS | Status: DC | PRN
  Administered 2014-04-05: 50 mg via INTRAVENOUS
  Administered 2014-04-05: 150 mg via INTRAVENOUS

## 2014-04-05 MED ORDER — SUCCINYLCHOLINE CHLORIDE 20 MG/ML IJ SOLN
INTRAMUSCULAR | Status: DC | PRN
  Administered 2014-04-05: 120 mg via INTRAVENOUS

## 2014-04-05 MED ORDER — ONDANSETRON HCL 4 MG/2ML IJ SOLN
4.0000 mg | Freq: Once | INTRAMUSCULAR | Status: AC | PRN
  Administered 2014-04-05: 4 mg via INTRAVENOUS

## 2014-04-05 MED ORDER — CIPROFLOXACIN IN D5W 400 MG/200ML IV SOLN
400.0000 mg | INTRAVENOUS | Status: AC
  Administered 2014-04-05: 400 mg via INTRAVENOUS

## 2014-04-05 MED ORDER — POVIDONE-IODINE 10 % OINT PACKET
TOPICAL_OINTMENT | CUTANEOUS | Status: DC | PRN
  Administered 2014-04-05: 1 via TOPICAL

## 2014-04-05 MED ORDER — MIDAZOLAM HCL 2 MG/2ML IJ SOLN
INTRAMUSCULAR | Status: AC
  Filled 2014-04-05: qty 2

## 2014-04-05 MED ORDER — DEXAMETHASONE SODIUM PHOSPHATE 4 MG/ML IJ SOLN
4.0000 mg | Freq: Once | INTRAMUSCULAR | Status: AC
  Administered 2014-04-05: 4 mg via INTRAVENOUS

## 2014-04-05 MED ORDER — LIDOCAINE HCL (PF) 1 % IJ SOLN
INTRAMUSCULAR | Status: AC
  Filled 2014-04-05: qty 5

## 2014-04-05 MED ORDER — NEOSTIGMINE METHYLSULFATE 10 MG/10ML IV SOLN
INTRAVENOUS | Status: DC | PRN
  Administered 2014-04-05: 4 mg via INTRAVENOUS

## 2014-04-05 SURGICAL SUPPLY — 43 items
APPLIER CLIP LAPSCP 10X32 DD (CLIP) ×2 IMPLANT
BAG HAMPER (MISCELLANEOUS) ×2 IMPLANT
BAG SPEC RTRVL LRG 6X4 10 (ENDOMECHANICALS) ×1
CHLORAPREP W/TINT 26ML (MISCELLANEOUS) ×2 IMPLANT
CLOTH BEACON ORANGE TIMEOUT ST (SAFETY) ×2 IMPLANT
COVER LIGHT HANDLE STERIS (MISCELLANEOUS) ×4 IMPLANT
DECANTER SPIKE VIAL GLASS SM (MISCELLANEOUS) ×2 IMPLANT
ELECT REM PT RETURN 9FT ADLT (ELECTROSURGICAL) ×2
ELECTRODE REM PT RTRN 9FT ADLT (ELECTROSURGICAL) ×1 IMPLANT
FILTER SMOKE EVAC LAPAROSHD (FILTER) ×2 IMPLANT
FORMALIN 10 PREFIL 120ML (MISCELLANEOUS) ×2 IMPLANT
GLOVE BIOGEL PI IND STRL 7.0 (GLOVE) IMPLANT
GLOVE BIOGEL PI INDICATOR 7.0 (GLOVE) ×2
GLOVE ECLIPSE 6.5 STRL STRAW (GLOVE) ×2 IMPLANT
GLOVE EXAM NITRILE LRG STRL (GLOVE) ×1 IMPLANT
GLOVE SURG SS PI 7.5 STRL IVOR (GLOVE) ×2 IMPLANT
GOWN STRL REUS W/ TWL XL LVL3 (GOWN DISPOSABLE) ×1 IMPLANT
GOWN STRL REUS W/TWL LRG LVL3 (GOWN DISPOSABLE) ×4 IMPLANT
GOWN STRL REUS W/TWL XL LVL3 (GOWN DISPOSABLE) ×2
HEMOSTAT SNOW SURGICEL 2X4 (HEMOSTASIS) ×2 IMPLANT
INST SET LAPROSCOPIC AP (KITS) ×2 IMPLANT
IV NS IRRIG 3000ML ARTHROMATIC (IV SOLUTION) IMPLANT
KIT ROOM TURNOVER APOR (KITS) ×2 IMPLANT
MANIFOLD NEPTUNE II (INSTRUMENTS) ×2 IMPLANT
NDL INSUFFLATION 14GA 120MM (NEEDLE) ×1 IMPLANT
NEEDLE INSUFFLATION 14GA 120MM (NEEDLE) ×2 IMPLANT
NS IRRIG 1000ML POUR BTL (IV SOLUTION) ×2 IMPLANT
PACK LAP CHOLE LZT030E (CUSTOM PROCEDURE TRAY) ×2 IMPLANT
PAD ARMBOARD 7.5X6 YLW CONV (MISCELLANEOUS) ×2 IMPLANT
POUCH SPECIMEN RETRIEVAL 10MM (ENDOMECHANICALS) ×2 IMPLANT
SET BASIN LINEN APH (SET/KITS/TRAYS/PACK) ×2 IMPLANT
SET TUBE IRRIG SUCTION NO TIP (IRRIGATION / IRRIGATOR) IMPLANT
SLEEVE ENDOPATH XCEL 5M (ENDOMECHANICALS) ×2 IMPLANT
SPONGE GAUZE 2X2 8PLY STRL LF (GAUZE/BANDAGES/DRESSINGS) ×8 IMPLANT
STAPLER VISISTAT (STAPLE) ×2 IMPLANT
SUT VICRYL 0 UR6 27IN ABS (SUTURE) ×2 IMPLANT
TAPE CLOTH SURG 4X10 WHT LF (GAUZE/BANDAGES/DRESSINGS) ×1 IMPLANT
TROCAR ENDO BLADELESS 11MM (ENDOMECHANICALS) ×2 IMPLANT
TROCAR XCEL NON-BLD 5MMX100MML (ENDOMECHANICALS) ×2 IMPLANT
TROCAR XCEL UNIV SLVE 11M 100M (ENDOMECHANICALS) ×2 IMPLANT
TUBING INSUFFLATION (TUBING) ×2 IMPLANT
WARMER LAPAROSCOPE (MISCELLANEOUS) ×2 IMPLANT
YANKAUER SUCT 12FT TUBE ARGYLE (SUCTIONS) ×2 IMPLANT

## 2014-04-05 NOTE — Anesthesia Procedure Notes (Signed)
Procedure Name: Intubation Date/Time: 04/05/2014 8:14 AM Performed by: Vista Deck Pre-anesthesia Checklist: Patient identified, Emergency Drugs available, Suction available, Patient being monitored and Timeout performed Patient Re-evaluated:Patient Re-evaluated prior to inductionOxygen Delivery Method: Circle system utilized Preoxygenation: Pre-oxygenation with 100% oxygen Intubation Type: IV induction, Rapid sequence and Cricoid Pressure applied Laryngoscope Size: Glidescope and 3 Grade View: Grade I Tube type: Oral Tube size: 7.0 mm Number of attempts: 1 Airway Equipment and Method: Video-laryngoscopy and Stylet Placement Confirmation: ETT inserted through vocal cords under direct vision,  positive ETCO2 and breath sounds checked- equal and bilateral Secured at: 22 cm Tube secured with: Tape Dental Injury: Teeth and Oropharynx as per pre-operative assessment

## 2014-04-05 NOTE — Anesthesia Postprocedure Evaluation (Signed)
  Anesthesia Post-op Note  Patient: [name]  Procedure(s) Performed: Procedure(s): LAPAROSCOPIC CHOLECYSTECTOMY (N/A)  Patient Location: Short Stay  Anesthesia Type:General  Level of Consciousness: awake, alert  and oriented  Airway and Oxygen Therapy: Patient Spontanous Breathing  Post-op Pain: mild  Post-op Assessment: Post-op Vital signs reviewed, Patient's Cardiovascular Status Stable, Respiratory Function Stable, Patent Airway and No signs of Nausea or vomiting  Post-op Vital Signs: Reviewed and stable  Last Vitals:  Filed Vitals:   04/05/14 1030  BP: 156/69  Pulse: 78  Temp: 36.6 C  Resp: 18    Complications: No apparent anesthesia complications

## 2014-04-05 NOTE — Op Note (Signed)
Patient:  [name]  DOB:  17-Sep-1960  MRN:  [id number]   Preop Diagnosis:  Chronic cholecystitis  Postop Diagnosis:  Same  Procedure:  Laparoscopic cholecystectomy  Surgeon:  Aviva Signs, M.D.  Anes:  Gen. endotracheal  Indications:  Patient is a 54 year old black female who was found on workup to have chronic cholecystitis. Risks and benefits of the procedure including bleeding, infection, hepatobiliary injury, and the possibility of an open procedure were fully explained to the patient, who gave informed consent.  Procedure note:  The patient was placed the supine position. After induction of general endotracheal anesthesia, the abdomen was prepped and draped using the usual sterile technique with ChloraPrep. Surgical site confirmation was performed.  A supraumbilical incision was made down to the fascia. A Veress needle was introduced into the abdominal cavity and confirmation of placement was done using the saline drop test. The abdomen was then insufflated to 16 mmHg pressure. An 11 mm trocar was introduced into the abdominal cavity under direct visualization without difficulty. The patient was placed in reverse Trendelenburg position and an additional 11 mm trocar was placed the epigastric region and 5 mm trochars were placed the right upper quadrant right flank regions. The liver was inspected and multiple adhesions of the liver to the abdominal wall. These were lysed using EndoShears. The gallbladder was then retracted in a dynamic fashion in order to expose the triangle of Calot.  The cystic duct was first identified. Its juncture to the infundibulum was fully identified. Endoclips placed proximally and distally on the cystic duct, and the cystic duct was divided. This was likewise done cystic artery. The gallbladder was freed away from the gallbladder fossa using Bovie electrocautery. The gallbladder was delivered through the epigastric trocar site using an Endo Catch bag. The  gallbladder fossa. No abnormal bleeding or bile leak Surgicel is placed. All fluid and air were then evacuated from the abdominal cavity prior to removal of the trochars.  All wounds were irrigated with normal saline. All wounds were injected with 0.5% Sensorcaine. The supraumbilical fascia was reapproximated using an 0 Vicryl interrupted suture. All skin incisions were closed using staples. Betadine ointment and dry sterile dressings were applied.  All tape and needle counts were correct at the end of the procedure. The patient was extubated in the operating room and transferred to PACU in stable condition.  Complications:  None  EBL:  Minimal  Specimen:  Gallbladder

## 2014-04-05 NOTE — Discharge Instructions (Signed)

## 2014-04-05 NOTE — Transfer of Care (Signed)
Immediate Anesthesia Transfer of Care Note  Patient: [name]  Procedure(s) Performed: Procedure(s): LAPAROSCOPIC CHOLECYSTECTOMY (N/A)  Patient Location: PACU  Anesthesia Type:General  Level of Consciousness: sedated and patient cooperative  Airway & Oxygen Therapy: Patient Spontanous Breathing and non-rebreather face mask  Post-op Assessment: Report given to RN, Post -op Vital signs reviewed and stable and Patient moving all extremities  Post vital signs: Reviewed and stable  Last Vitals:  Filed Vitals:   04/05/14 0800  BP: 120/63  Temp:   Resp: 23    Complications: No apparent anesthesia complications

## 2014-04-05 NOTE — Anesthesia Preprocedure Evaluation (Addendum)
Anesthesia Evaluation  Patient identified by MRN, date of birth, ID band Patient awake    Reviewed: Allergy & Precautions, H&P , NPO status , Patient's Chart, lab work & pertinent test results  History of Anesthesia Complications Negative for: history of anesthetic complications  Airway Mallampati: III  TM Distance: >3 FB   Mouth opening: Limited Mouth Opening  Dental  (+) Edentulous Upper, Partial Lower   Pulmonary asthma , sleep apnea , former smoker,  L Lower Lobectomy in 1997 for CA breath sounds clear to auscultation        Cardiovascular hypertension, Pt. on medications Rhythm:Regular Rate:Normal     Neuro/Psych PSYCHIATRIC DISORDERS Depression    GI/Hepatic GERD- (inactive now)  Controlled and Medicated,  Endo/Other    Renal/GU      Musculoskeletal   Abdominal   Peds  Hematology   Anesthesia Other Findings   Reproductive/Obstetrics                            Anesthesia Physical Anesthesia Plan  ASA: III  Anesthesia Plan: General   Post-op Pain Management:    Induction: Intravenous, Rapid sequence and Cricoid pressure planned  Airway Management Planned: Oral ETT and Video Laryngoscope Planned  Additional Equipment:   Intra-op Plan:   Post-operative Plan: Extubation in OR  Informed Consent: I have reviewed the patients History and Physical, chart, labs and discussed the procedure including the risks, benefits and alternatives for the proposed anesthesia with the patient or authorized representative who has indicated his/her understanding and acceptance.     Plan Discussed with:   Anesthesia Plan Comments:         Anesthesia Quick Evaluation

## 2014-04-05 NOTE — Interval H&P Note (Signed)
History and Physical Interval Note:  04/05/2014 7:55 AM  [name]  has presented today for surgery, with the diagnosis of chronic cholecystitis  The various methods of treatment have been discussed with the patient and family. After consideration of risks, benefits and other options for treatment, the patient has consented to  Procedure(s): LAPAROSCOPIC CHOLECYSTECTOMY (N/A) as a surgical intervention .  The patient's history has been reviewed, patient examined, no change in status, stable for surgery.  I have reviewed the patient's chart and labs.  Questions were answered to the patient's satisfaction.     Aviva Signs A

## 2014-04-08 ENCOUNTER — Encounter (HOSPITAL_COMMUNITY): Payer: Self-pay | Admitting: General Surgery

## 2014-04-15 ENCOUNTER — Telehealth: Payer: Self-pay | Admitting: Family Medicine

## 2014-04-15 NOTE — Telephone Encounter (Signed)
Pt called because the referral that she was given can not be done at that office because the doctor said that one of the cyst is on her bone and that she needs to be referred to a back doctor. jw

## 2014-04-15 NOTE — Telephone Encounter (Signed)
Called and discussed with oatient. I asked her to request a note so I can understand exactly where Dr. Arnoldo Morale would like me to send her.   She agrees.   Laroy Apple, MD [place] Resident, PGY-3 04/15/2014, 12:18 PM

## 2014-04-17 ENCOUNTER — Other Ambulatory Visit: Payer: Self-pay | Admitting: Family Medicine

## 2014-04-24 ENCOUNTER — Other Ambulatory Visit: Payer: Self-pay | Admitting: Family Medicine

## 2014-05-09 ENCOUNTER — Telehealth: Payer: Self-pay | Admitting: Family Medicine

## 2014-05-09 NOTE — Telephone Encounter (Signed)
Pt called and wanted to know how much longer will it be to see a back surgeon? Also what is she suppose to do about the pain. jw

## 2014-05-09 NOTE — Telephone Encounter (Signed)
No referral entered, will forward to PCP.

## 2014-05-10 NOTE — Telephone Encounter (Signed)
Pt scheduled to come see dr Wendi Snipes in may. Gevon Markus Kennon Holter, CMA

## 2014-05-10 NOTE — Telephone Encounter (Signed)
I cannot see any record of seeing the patient for back pain. She should be seen if she thinks she needs a Psychologist, sport and exercise and pain meds.   Will ask nursing to inform that we weren't aware she needs a referral and arrange OV to eval back pain.   Laroy Apple, MD [place] Resident, PGY-3 05/10/2014, 8:53 AM

## 2014-05-31 ENCOUNTER — Ambulatory Visit (INDEPENDENT_AMBULATORY_CARE_PROVIDER_SITE_OTHER): Payer: Medicare Other | Admitting: Family Medicine

## 2014-05-31 ENCOUNTER — Encounter: Payer: Self-pay | Admitting: Family Medicine

## 2014-05-31 VITALS — BP 139/66 | HR 85 | Temp 98.0°F | Ht 66.0 in | Wt 178.2 lb

## 2014-05-31 DIAGNOSIS — Z0000 Encounter for general adult medical examination without abnormal findings: Secondary | ICD-10-CM | POA: Diagnosis present

## 2014-05-31 DIAGNOSIS — L0591 Pilonidal cyst without abscess: Secondary | ICD-10-CM

## 2014-05-31 MED ORDER — HYDROCODONE-ACETAMINOPHEN 5-325 MG PO TABS: 1.0000 | ORAL_TABLET | Freq: Four times a day (QID) | ORAL | Status: DC | PRN

## 2014-05-31 NOTE — Progress Notes (Signed)
Patient ID: [name], female   DOB: 01-16-1961, 54 y.o.   MRN: [id number]   HPI  Patient presents today for a pilonidal cyst  History of present illness seen back in March for pilonidal cyst which was felt to be acutely inflamed. Treated with antibiotics she states that it did not get much better. We referred her to Gen. surgery to evaluate her gallbladder as well as the pilonidal cyst, however the surgeon felt that the cyst would be better addressed by a back surgeon. Patient comes in today frustrated because she's not gotten an appointment with another physician.  She denies fever, chills, sweats, oozing from the site. She does have persistent severe pain in the area of the cyst on her buttock.  She's doing better from abdominal pain perspective after cholecystectomy.  She is also due for mammogram and agrees to all for appointment.  Smoking status noted ROS: Per HPI  Objective: BP 139/66 mmHg  Pulse 85  Temp(Src) 98 F (36.7 C) (Oral)  Ht '5\' 6"'$  (1.676 m)  Wt 178 lb 3.2 oz (80.831 kg)  BMI 28.78 kg/m2 Gen: NAD, alert, cooperative with exam HEENT: NCAT CV: RRR, good S1/S2, no murmur Resp: CTABL, no wheezes, non-labored Ext: No edema, warm Neuro: Alert and oriented, No gross deficits  Buttock: two small cystic like lesions at superior end of gluteal cleft, tender to palp but no erythema or warmth  Assessment and plan:  Pilonidal cyst Continued pain, seen Gen surg in [place] who would like her to see another surgeion for pilonidal cyst treatment No acute inflammation but considering pain will give 45 norco and refer to [place] surgery Abx did not help last time and besides pain there are no signs of active infection   Healthcare maintenance Due for mammogram, information given for appointment     Orders Placed This Encounter  Procedures  . Ambulatory referral to General Surgery    Referral Priority:  Routine    Referral Type:  Surgical    Referral  Reason:  Specialty Services Required    Requested Specialty:  General Surgery    Number of Visits Requested:  1    Meds ordered this encounter  Medications  . HYDROcodone-acetaminophen (NORCO) 5-325 MG per tablet    Sig: Take 1 tablet by mouth every 6 (six) hours as needed for moderate pain.    Dispense:  45 tablet    Refill:  0

## 2014-05-31 NOTE — Assessment & Plan Note (Signed)
Continued pain, seen Gen surg in [place] who would like her to see another surgeion for pilonidal cyst treatment No acute inflammation but considering pain will give 45 norco and refer to [place] Abx did not help last time and besides pain there are no signs of active infection

## 2014-05-31 NOTE — Patient Instructions (Signed)
Great to see you!  If you have not heard anything in the next 3-5 days call us  Pilonidal Cyst A pilonidal cyst occurs when hairs get trapped (ingrown) beneath the skin in the crease between the buttocks over your sacrum (the bone under that crease). Pilonidal cysts are most common in young men with a lot of body hair. When the cyst is ruptured (breaks) or leaking, fluid from the cyst may cause burning and itching. If the cyst becomes infected, it causes a painful swelling filled with pus (abscess). The pus and trapped hairs need to be removed (often by lancing) so that the infection can heal. However, recurrence is common and an operation may be needed to remove the cyst. HOME CARE INSTRUCTIONS   If the cyst was NOT INFECTED:  Keep the area clean and dry. Bathe or shower daily. Wash the area well with a germ-killing soap. Warm tub baths may help prevent infection and help with drainage. Dry the area well with a towel.  Avoid tight clothing to keep area as moisture free as possible.  Keep area between buttocks as free of hair as possible. A depilatory may be used.  If the cyst WAS INFECTED and needed to be drained:  Your caregiver packed the wound with gauze to keep the wound open. This allows the wound to heal from the inside outwards and continue draining.  Return for a wound check in 1 day or as suggested.  If you take tub baths or showers, repack the wound with gauze following them. Sponge baths (at the sink) are a good alternative.  If an antibiotic was ordered to fight the infection, take as directed.  Only take over-the-counter or prescription medicines for pain, discomfort, or fever as directed by your caregiver.  After the drain is removed, use sitz baths for 20 minutes 4 times per day. Clean the wound gently with mild unscented soap, pat dry, and then apply a dry dressing. SEEK MEDICAL CARE IF:   You have increased pain, swelling, redness, drainage, or bleeding from the  area.  You have a fever.  You have muscles aches, dizziness, or a general ill feeling. Document Released: 01/02/2000 Document Revised: 03/29/2011 Document Reviewed: 03/01/2008 [facility] Patient Information 2015 [place], [place]. This information is not intended to replace advice given to you by your health care provider. Make sure you discuss any questions you have with your health care provider.

## 2014-05-31 NOTE — Assessment & Plan Note (Signed)
Due for mammogram, information given for appointment

## 2014-06-04 ENCOUNTER — Other Ambulatory Visit: Payer: Self-pay | Admitting: General Surgery

## 2014-06-04 DIAGNOSIS — M533 Sacrococcygeal disorders, not elsewhere classified: Secondary | ICD-10-CM

## 2014-06-04 DIAGNOSIS — K6289 Other specified diseases of anus and rectum: Secondary | ICD-10-CM | POA: Diagnosis not present

## 2014-06-07 ENCOUNTER — Ambulatory Visit
Admission: RE | Admit: 2014-06-07 | Discharge: 2014-06-07 | Disposition: A | Discharge status: Home / Self Care | Payer: Medicare Other | Source: Ambulatory Visit | Attending: General Surgery | Admitting: General Surgery

## 2014-06-07 DIAGNOSIS — K6289 Other specified diseases of anus and rectum: Secondary | ICD-10-CM | POA: Diagnosis not present

## 2014-06-07 MED ORDER — IOPAMIDOL (ISOVUE-300) INJECTION 61%
100.0000 mL | Freq: Once | INTRAVENOUS | Status: AC | PRN
  Administered 2014-06-07: 100 mL via INTRAVENOUS

## 2014-06-10 ENCOUNTER — Other Ambulatory Visit: Payer: Self-pay

## 2014-06-10 DIAGNOSIS — R748 Abnormal levels of other serum enzymes: Secondary | ICD-10-CM

## 2014-06-18 DIAGNOSIS — R222 Localized swelling, mass and lump, trunk: Secondary | ICD-10-CM | POA: Diagnosis not present

## 2014-06-19 ENCOUNTER — Other Ambulatory Visit (HOSPITAL_COMMUNITY): Payer: Self-pay | Admitting: Specialist

## 2014-06-19 DIAGNOSIS — R1904 Left lower quadrant abdominal swelling, mass and lump: Secondary | ICD-10-CM

## 2014-06-24 DIAGNOSIS — R748 Abnormal levels of other serum enzymes: Secondary | ICD-10-CM | POA: Diagnosis not present

## 2014-06-25 ENCOUNTER — Other Ambulatory Visit: Payer: Self-pay

## 2014-06-25 LAB — HEPATIC FUNCTION PANEL
ALT: 12 U/L (ref 0–35)
AST: 17 U/L (ref 0–37)
Albumin: 3.8 g/dL (ref 3.5–5.2)
Alkaline Phosphatase: 153 U/L — ABNORMAL HIGH (ref 39–117)
Bilirubin, Direct: 0.1 mg/dL (ref 0.0–0.3)
Indirect Bilirubin: 0.4 mg/dL (ref 0.2–1.2)
Total Bilirubin: 0.5 mg/dL (ref 0.2–1.2)
Total Protein: 7.2 g/dL (ref 6.0–8.3)

## 2014-06-26 MED ORDER — LUBIPROSTONE 24 MCG PO CAPS: 24.0000 ug | ORAL_CAPSULE | Freq: Two times a day (BID) | ORAL | Status: DC

## 2014-06-28 NOTE — Progress Notes (Signed)
Quick Note:  Alk phos remains stable. Repeat LFTs in 6 months. Let's get a GGT now. I don't see where this has been done. ______

## 2014-07-01 ENCOUNTER — Ambulatory Visit (HOSPITAL_COMMUNITY)
Admission: RE | Admit: 2014-07-01 | Discharge: 2014-07-01 | Disposition: A | Discharge status: Home / Self Care | Payer: Medicare Other | Source: Ambulatory Visit | Attending: Specialist | Admitting: Specialist

## 2014-07-01 DIAGNOSIS — M533 Sacrococcygeal disorders, not elsewhere classified: Secondary | ICD-10-CM | POA: Insufficient documentation

## 2014-07-01 DIAGNOSIS — M160 Bilateral primary osteoarthritis of hip: Secondary | ICD-10-CM | POA: Diagnosis not present

## 2014-07-01 DIAGNOSIS — R1904 Left lower quadrant abdominal swelling, mass and lump: Secondary | ICD-10-CM

## 2014-07-01 LAB — POCT I-STAT CREATININE: CREATININE: 0.9 mg/dL (ref 0.44–1.00)

## 2014-07-01 MED ORDER — GADOBENATE DIMEGLUMINE 529 MG/ML IV SOLN
15.0000 mL | Freq: Once | INTRAVENOUS | Status: AC | PRN
Start: 2014-07-01 — End: 2014-07-01
  Administered 2014-07-01: 15 mL via INTRAVENOUS

## 2014-07-02 ENCOUNTER — Other Ambulatory Visit: Payer: Self-pay | Admitting: Gastroenterology

## 2014-07-02 ENCOUNTER — Other Ambulatory Visit: Payer: Self-pay

## 2014-07-02 DIAGNOSIS — R748 Abnormal levels of other serum enzymes: Secondary | ICD-10-CM

## 2014-07-03 DIAGNOSIS — M2020 Hallux rigidus, unspecified foot: Secondary | ICD-10-CM | POA: Diagnosis not present

## 2014-07-03 DIAGNOSIS — R222 Localized swelling, mass and lump, trunk: Secondary | ICD-10-CM | POA: Diagnosis not present

## 2014-07-03 DIAGNOSIS — M461 Sacroiliitis, not elsewhere classified: Secondary | ICD-10-CM | POA: Diagnosis not present

## 2014-07-09 DIAGNOSIS — R748 Abnormal levels of other serum enzymes: Secondary | ICD-10-CM | POA: Diagnosis not present

## 2014-07-10 LAB — GAMMA GT: GGT: 50 U/L (ref 7–51)

## 2014-07-12 ENCOUNTER — Other Ambulatory Visit (HOSPITAL_COMMUNITY): Payer: Self-pay | Admitting: Specialist

## 2014-07-14 NOTE — Progress Notes (Signed)
Quick Note:  GGT is normal, which raises concern for elevated alk phos possibly coming from non-liver origin (?Bone) She has a negative AMA on file. MILD fatty liver on ultrasound. Short of a liver biopsy (which I don't feel is indicated at this time unless bump in transaminases or worsening alk phos), I would copy to PCP and ensure no other physiological issues occurring. Repeat LFTs in 6 months. Elastography could be considered in Aug 2016. ______

## 2014-07-16 ENCOUNTER — Encounter (HOSPITAL_COMMUNITY): Payer: Self-pay

## 2014-07-16 ENCOUNTER — Encounter (HOSPITAL_COMMUNITY)
Admission: RE | Admit: 2014-07-16 | Discharge: 2014-07-16 | Disposition: A | Discharge status: Home / Self Care | Payer: Medicare Other | Source: Ambulatory Visit | Attending: Specialist | Admitting: Specialist

## 2014-07-16 DIAGNOSIS — M533 Sacrococcygeal disorders, not elsewhere classified: Secondary | ICD-10-CM | POA: Insufficient documentation

## 2014-07-16 DIAGNOSIS — Z01812 Encounter for preprocedural laboratory examination: Secondary | ICD-10-CM | POA: Insufficient documentation

## 2014-07-16 LAB — CBC
HCT: 38.2 % (ref 36.0–46.0)
Hemoglobin: 12.9 g/dL (ref 12.0–15.0)
MCH: 31.6 pg (ref 26.0–34.0)
MCHC: 33.8 g/dL (ref 30.0–36.0)
MCV: 93.6 fL (ref 78.0–100.0)
PLATELETS: 253 10*3/uL (ref 150–400)
RBC: 4.08 MIL/uL (ref 3.87–5.11)
RDW: 12.6 % (ref 11.5–15.5)
WBC: 6 10*3/uL (ref 4.0–10.5)

## 2014-07-16 LAB — BASIC METABOLIC PANEL
Anion gap: 7 (ref 5–15)
CHLORIDE: 99 mmol/L — AB (ref 101–111)
CO2: 34 mmol/L — ABNORMAL HIGH (ref 22–32)
Calcium: 9 mg/dL (ref 8.9–10.3)
Creatinine, Ser: 0.85 mg/dL (ref 0.44–1.00)
GFR calc Af Amer: >60 mL/min (ref >60)
GLUCOSE: 98 mg/dL (ref 65–99)
Potassium: 3.1 mmol/L — ABNORMAL LOW (ref 3.5–5.1)
Sodium: 140 mmol/L (ref 135–145)

## 2014-07-16 LAB — SURGICAL PCR SCREEN
MRSA, PCR: NEGATIVE
Staphylococcus aureus: NEGATIVE

## 2014-07-16 NOTE — Pre-Procedure Instructions (Signed)
[name]  07/16/2014      [facility] - [place], [address] [place] [address] Phone: [phone] Fax: [phone]  [facility] [place] [place] - [address] [place] [address] [place] [address] Phone: [phone] Fax: [phone]    Your procedure is scheduled on July 5th, Tuesday   Report to [facility] Admitting at 5:30 AM  Call this number if you have problems the morning of surgery:  [phone]   Remember:  Do not eat food or drink liquids after midnight Monday.  Take these medicines the morning of surgery with A SIP OF WATER : Dexilant, Cymbalta, Neurontin, Pain Medication   Do not wear jewelry, make-up or nail polish.  Do not wear lotions, powders, or perfumes.  You may NOT wear deodorant the day of surgery.  Do not shave underarms & legs 48 hours prior to surgery.     Do not bring valuables to the hospital.  [facility] is not responsible for any belongings or valuables. Contacts, dentures or bridgework may not be worn into surgery.  Leave your suitcase in the car.  After surgery it may be brought to your room.  Patients discharged the day of surgery will not be allowed to drive home.   Name and phone number of your driver:     Special instructions:  "Preparing for Surgery" instruction sheet  Please read over the following fact sheets that you were given. Pain Booklet, MRSA Information and Surgical Site Infection Prevention

## 2014-07-16 NOTE — Progress Notes (Addendum)
Denies any cardiac issues.  Has never seen cardiologist.

## 2014-07-19 MED ORDER — BUPIVACAINE LIPOSOME 1.3 % IJ SUSP
20.0000 mL | INTRAMUSCULAR | Status: DC
  Filled 2014-07-19: qty 20

## 2014-07-23 ENCOUNTER — Encounter (HOSPITAL_COMMUNITY)
Admission: RE | Disposition: A | Discharge status: Home / Self Care | Payer: Self-pay | Source: Ambulatory Visit | Attending: Specialist

## 2014-07-23 ENCOUNTER — Ambulatory Visit (HOSPITAL_COMMUNITY): Payer: Medicare Other | Admitting: Anesthesiology

## 2014-07-23 ENCOUNTER — Encounter (HOSPITAL_COMMUNITY): Payer: Self-pay | Admitting: *Deleted

## 2014-07-23 ENCOUNTER — Ambulatory Visit (HOSPITAL_COMMUNITY)
Admission: RE | Admit: 2014-07-23 | Discharge: 2014-07-23 | Disposition: A | Discharge status: Home / Self Care | Payer: Medicare Other | Source: Ambulatory Visit | Attending: Specialist | Admitting: Specialist

## 2014-07-23 DIAGNOSIS — E785 Hyperlipidemia, unspecified: Secondary | ICD-10-CM | POA: Diagnosis not present

## 2014-07-23 DIAGNOSIS — Z87891 Personal history of nicotine dependence: Secondary | ICD-10-CM | POA: Diagnosis not present

## 2014-07-23 DIAGNOSIS — R229 Localized swelling, mass and lump, unspecified: Secondary | ICD-10-CM | POA: Diagnosis not present

## 2014-07-23 DIAGNOSIS — M533 Sacrococcygeal disorders, not elsewhere classified: Secondary | ICD-10-CM | POA: Diagnosis present

## 2014-07-23 DIAGNOSIS — M549 Dorsalgia, unspecified: Secondary | ICD-10-CM | POA: Diagnosis not present

## 2014-07-23 DIAGNOSIS — Z9889 Other specified postprocedural states: Secondary | ICD-10-CM

## 2014-07-23 DIAGNOSIS — M1990 Unspecified osteoarthritis, unspecified site: Secondary | ICD-10-CM | POA: Insufficient documentation

## 2014-07-23 DIAGNOSIS — M7989 Other specified soft tissue disorders: Secondary | ICD-10-CM | POA: Diagnosis not present

## 2014-07-23 DIAGNOSIS — K6389 Other specified diseases of intestine: Secondary | ICD-10-CM | POA: Insufficient documentation

## 2014-07-23 DIAGNOSIS — I10 Essential (primary) hypertension: Secondary | ICD-10-CM | POA: Insufficient documentation

## 2014-07-23 DIAGNOSIS — D216 Benign neoplasm of connective and other soft tissue of trunk, unspecified: Secondary | ICD-10-CM | POA: Diagnosis not present

## 2014-07-23 DIAGNOSIS — R222 Localized swelling, mass and lump, trunk: Secondary | ICD-10-CM | POA: Diagnosis present

## 2014-07-23 DIAGNOSIS — K219 Gastro-esophageal reflux disease without esophagitis: Secondary | ICD-10-CM | POA: Diagnosis not present

## 2014-07-23 HISTORY — PX: MASS EXCISION: SHX2000

## 2014-07-23 SURGERY — EXCISION MASS
Anesthesia: General

## 2014-07-23 MED ORDER — GLYCOPYRROLATE 0.2 MG/ML IJ SOLN
INTRAMUSCULAR | Status: AC
  Filled 2014-07-23: qty 1

## 2014-07-23 MED ORDER — FENTANYL CITRATE (PF) 100 MCG/2ML IJ SOLN
INTRAMUSCULAR | Status: DC | PRN
  Administered 2014-07-23 (×3): 50 ug via INTRAVENOUS

## 2014-07-23 MED ORDER — LIDOCAINE HCL (CARDIAC) 20 MG/ML IV SOLN
INTRAVENOUS | Status: AC
  Filled 2014-07-23: qty 5

## 2014-07-23 MED ORDER — OXYCODONE HCL 5 MG/5ML PO SOLN: 5.0000 mg | Freq: Once | ORAL | Status: DC | PRN

## 2014-07-23 MED ORDER — OXYCODONE HCL 5 MG PO TABS: 5.0000 mg | ORAL_TABLET | Freq: Once | ORAL | Status: DC | PRN

## 2014-07-23 MED ORDER — SUCCINYLCHOLINE CHLORIDE 20 MG/ML IJ SOLN
INTRAMUSCULAR | Status: AC
  Filled 2014-07-23: qty 1

## 2014-07-23 MED ORDER — CEFAZOLIN SODIUM-DEXTROSE 2-3 GM-% IV SOLR
INTRAVENOUS | Status: AC
  Filled 2014-07-23: qty 50

## 2014-07-23 MED ORDER — ALBUTEROL SULFATE (2.5 MG/3ML) 0.083% IN NEBU
INHALATION_SOLUTION | RESPIRATORY_TRACT | Status: AC
  Filled 2014-07-23: qty 3

## 2014-07-23 MED ORDER — ONDANSETRON HCL 4 MG/2ML IJ SOLN: 4.0000 mg | Freq: Once | INTRAMUSCULAR | Status: DC | PRN

## 2014-07-23 MED ORDER — SUGAMMADEX SODIUM 200 MG/2ML IV SOLN
INTRAVENOUS | Status: AC
  Filled 2014-07-23: qty 2

## 2014-07-23 MED ORDER — ROCURONIUM BROMIDE 50 MG/5ML IV SOLN
INTRAVENOUS | Status: AC
  Filled 2014-07-23: qty 1

## 2014-07-23 MED ORDER — CHLORHEXIDINE GLUCONATE 4 % EX LIQD: 60.0000 mL | Freq: Once | CUTANEOUS | Status: DC

## 2014-07-23 MED ORDER — SUGAMMADEX SODIUM 200 MG/2ML IV SOLN
INTRAVENOUS | Status: DC | PRN
  Administered 2014-07-23: 324.8 mg via INTRAVENOUS

## 2014-07-23 MED ORDER — SUCCINYLCHOLINE CHLORIDE 20 MG/ML IJ SOLN
INTRAMUSCULAR | Status: DC | PRN
  Administered 2014-07-23: 100 mg via INTRAVENOUS

## 2014-07-23 MED ORDER — MIDAZOLAM HCL 2 MG/2ML IJ SOLN
INTRAMUSCULAR | Status: AC
  Filled 2014-07-23: qty 2

## 2014-07-23 MED ORDER — OXYCODONE HCL 5 MG PO TABS: 5.0000 mg | ORAL_TABLET | Freq: Four times a day (QID) | ORAL | Status: DC | PRN

## 2014-07-23 MED ORDER — BUPIVACAINE HCL (PF) 0.5 % IJ SOLN
INTRAMUSCULAR | Status: AC
  Filled 2014-07-23: qty 20

## 2014-07-23 MED ORDER — MIDAZOLAM HCL 5 MG/5ML IJ SOLN
INTRAMUSCULAR | Status: DC | PRN
  Administered 2014-07-23: 2 mg via INTRAVENOUS

## 2014-07-23 MED ORDER — ONDANSETRON HCL 4 MG/2ML IJ SOLN
INTRAMUSCULAR | Status: DC | PRN
  Administered 2014-07-23: 4 mg via INTRAVENOUS

## 2014-07-23 MED ORDER — ROXICET 5-325 MG PO TABS: 1.0000 | ORAL_TABLET | Freq: Four times a day (QID) | ORAL | Status: DC | PRN

## 2014-07-23 MED ORDER — LACTATED RINGERS IV SOLN
INTRAVENOUS | Status: DC | PRN
  Administered 2014-07-23: 07:00:00 via INTRAVENOUS

## 2014-07-23 MED ORDER — EPHEDRINE SULFATE 50 MG/ML IJ SOLN
INTRAMUSCULAR | Status: AC
  Filled 2014-07-23: qty 1

## 2014-07-23 MED ORDER — SODIUM CHLORIDE 0.9 % IJ SOLN
INTRAMUSCULAR | Status: AC
  Filled 2014-07-23: qty 10

## 2014-07-23 MED ORDER — ROCURONIUM BROMIDE 100 MG/10ML IV SOLN
INTRAVENOUS | Status: DC | PRN
  Administered 2014-07-23: 20 mg via INTRAVENOUS
  Administered 2014-07-23: 100 mg via INTRAVENOUS

## 2014-07-23 MED ORDER — FENTANYL CITRATE (PF) 250 MCG/5ML IJ SOLN
INTRAMUSCULAR | Status: AC
  Filled 2014-07-23: qty 5

## 2014-07-23 MED ORDER — VECURONIUM BROMIDE 10 MG IV SOLR
INTRAVENOUS | Status: AC
  Filled 2014-07-23: qty 10

## 2014-07-23 MED ORDER — LIDOCAINE HCL (CARDIAC) 20 MG/ML IV SOLN
INTRAVENOUS | Status: DC | PRN
  Administered 2014-07-23: 60 mg via INTRAVENOUS

## 2014-07-23 MED ORDER — HYDROMORPHONE HCL 1 MG/ML IJ SOLN: 0.2500 mg | INTRAMUSCULAR | Status: DC | PRN

## 2014-07-23 MED ORDER — GLYCOPYRROLATE 0.2 MG/ML IJ SOLN
INTRAMUSCULAR | Status: DC | PRN
Start: 2014-07-23 — End: 2014-07-23
  Administered 2014-07-23: 0.1 mg via INTRAVENOUS

## 2014-07-23 MED ORDER — PROPOFOL 10 MG/ML IV BOLUS
INTRAVENOUS | Status: AC
  Filled 2014-07-23: qty 20

## 2014-07-23 MED ORDER — BUPIVACAINE LIPOSOME 1.3 % IJ SUSP
INTRAMUSCULAR | Status: DC | PRN
  Administered 2014-07-23: 12 mL

## 2014-07-23 MED ORDER — PROPOFOL 10 MG/ML IV BOLUS
INTRAVENOUS | Status: DC | PRN
  Administered 2014-07-23: 180 mg via INTRAVENOUS

## 2014-07-23 MED ORDER — CEFAZOLIN SODIUM-DEXTROSE 2-3 GM-% IV SOLR
INTRAVENOUS | Status: DC | PRN
  Administered 2014-07-23: 2 g via INTRAVENOUS

## 2014-07-23 MED ORDER — ONDANSETRON HCL 4 MG/2ML IJ SOLN
INTRAMUSCULAR | Status: AC
  Filled 2014-07-23: qty 2

## 2014-07-23 MED ORDER — ALBUTEROL SULFATE (2.5 MG/3ML) 0.083% IN NEBU
2.5000 mg | INHALATION_SOLUTION | Freq: Four times a day (QID) | RESPIRATORY_TRACT | Status: DC | PRN
  Administered 2014-07-23: 2.5 mg via RESPIRATORY_TRACT

## 2014-07-23 SURGICAL SUPPLY — 44 items
ADH SKN CLS APL DERMABOND .7 (GAUZE/BANDAGES/DRESSINGS)
DERMABOND ADVANCED (GAUZE/BANDAGES/DRESSINGS)
DERMABOND ADVANCED .7 DNX12 (GAUZE/BANDAGES/DRESSINGS) IMPLANT
DRAPE INCISE IOBAN 66X45 STRL (DRAPES) IMPLANT
DRAPE SURG 17X23 STRL (DRAPES) ×8 IMPLANT
DRSG MEPILEX BORDER 4X4 (GAUZE/BANDAGES/DRESSINGS) IMPLANT
DRSG MEPILEX BORDER 4X8 (GAUZE/BANDAGES/DRESSINGS) IMPLANT
DRSG TEGADERM 4X4.75 (GAUZE/BANDAGES/DRESSINGS) ×1 IMPLANT
DURAPREP 26ML APPLICATOR (WOUND CARE) ×2 IMPLANT
ELECT CAUTERY BLADE 6.4 (BLADE) ×2 IMPLANT
ELECT REM PT RETURN 9FT ADLT (ELECTROSURGICAL) ×2
ELECTRODE REM PT RTRN 9FT ADLT (ELECTROSURGICAL) ×1 IMPLANT
GAUZE SPONGE 4X4 12PLY STRL (GAUZE/BANDAGES/DRESSINGS) ×2 IMPLANT
GLOVE BIOGEL PI IND STRL 8 (GLOVE) ×1 IMPLANT
GLOVE BIOGEL PI INDICATOR 8 (GLOVE) ×1
GLOVE ECLIPSE 9.0 STRL (GLOVE) ×2 IMPLANT
GLOVE ORTHO TXT STRL SZ7.5 (GLOVE) ×2 IMPLANT
GLOVE SURG 8.5 LATEX PF (GLOVE) ×2 IMPLANT
GOWN STRL REUS W/ TWL LRG LVL3 (GOWN DISPOSABLE) ×1 IMPLANT
GOWN STRL REUS W/TWL 2XL LVL3 (GOWN DISPOSABLE) ×4 IMPLANT
GOWN STRL REUS W/TWL LRG LVL3 (GOWN DISPOSABLE) ×2
KIT BASIN OR (CUSTOM PROCEDURE TRAY) ×2 IMPLANT
KIT ROOM TURNOVER OR (KITS) ×2 IMPLANT
MANIFOLD NEPTUNE II (INSTRUMENTS) ×2 IMPLANT
NEEDLE 22X1 1/2 (OR ONLY) (NEEDLE) ×2 IMPLANT
NS IRRIG 1000ML POUR BTL (IV SOLUTION) ×2 IMPLANT
PACK LAMINECTOMY ORTHO (CUSTOM PROCEDURE TRAY) ×2 IMPLANT
PAD ARMBOARD 7.5X6 YLW CONV (MISCELLANEOUS) ×4 IMPLANT
SPECIMEN JAR SMALL (MISCELLANEOUS) ×2 IMPLANT
SPONGE GAUZE 4X4 12PLY STER LF (GAUZE/BANDAGES/DRESSINGS) ×1 IMPLANT
SPONGE SURGIFOAM ABS GEL 100 (HEMOSTASIS) IMPLANT
STRIP CLOSURE SKIN 1/2X4 (GAUZE/BANDAGES/DRESSINGS) IMPLANT
SUT VIC AB 1 CT1 27 (SUTURE) ×2
SUT VIC AB 1 CT1 27XBRD ANBCTR (SUTURE) ×1 IMPLANT
SUT VIC AB 2-0 CT1 27 (SUTURE)
SUT VIC AB 2-0 CT1 TAPERPNT 27 (SUTURE) IMPLANT
SUT VICRYL 0 UR6 27IN ABS (SUTURE) ×2 IMPLANT
SUT VICRYL 4-0 PS2 18IN ABS (SUTURE) IMPLANT
SWABSTICK BENZOIN STERILE (MISCELLANEOUS) ×2 IMPLANT
SYR CONTROL 10ML LL (SYRINGE) ×2 IMPLANT
TOWEL OR 17X24 6PK STRL BLUE (TOWEL DISPOSABLE) ×2 IMPLANT
TOWEL OR 17X26 10 PK STRL BLUE (TOWEL DISPOSABLE) ×2 IMPLANT
WATER STERILE IRR 1000ML POUR (IV SOLUTION) ×2 IMPLANT
YANKAUER SUCT BULB TIP NO VENT (SUCTIONS) ×2 IMPLANT

## 2014-07-23 NOTE — Anesthesia Procedure Notes (Signed)
Procedure Name: Intubation Date/Time: 07/23/2014 7:49 AM Performed by: Suzette Battiest Pre-anesthesia Checklist: Patient identified, Emergency Drugs available, Suction available, Patient being monitored and Timeout performed Patient Re-evaluated:Patient Re-evaluated prior to inductionOxygen Delivery Method: Circle system utilized Preoxygenation: Pre-oxygenation with 100% oxygen Intubation Type: IV induction Ventilation: Mask ventilation without difficulty Laryngoscope Size: Mac and 3 Grade View: Grade II Tube type: Oral Tube size: 7.0 mm Number of attempts: 1 Placement Confirmation: ETT inserted through vocal cords under direct vision,  positive ETCO2 and breath sounds checked- equal and bilateral Secured at: 23 cm Tube secured with: Tape Dental Injury: Teeth and Oropharynx as per pre-operative assessment

## 2014-07-23 NOTE — H&P (Signed)
[name] is an 54 y.o. female.   CHIEF COMPLAINT:   Tailbone pain.   HISTORY OF PRESENT ILLNESS:  The patient a 54 year old female.  She returns today for followup of MRI scan that was done 07/01/2014.  MRI was of her sacrum of her left SI joints and right SI joint.  She notices a knot over the left side of her coccyx and sacral area.  She is concerned about this.  Relates that it is painful.  It is painful for her to sit on this area.  She has been pillows, she has been donuts, these do not seem to help.  She feels as though her legs and feet go numb intermittently.  She notices pain mainly into the left leg.  The pain is still under her tailbone.  She had been referred by a doctor from [place], Dr. Carlene Coria that recommended that we see this patient because of severe tailbone pain she was experiencing.  At that time there were concerns that she may have a epidermal cyst in the area of her tailbone, but whatever the problem was, it was intrinsic to her tailbone.     CT scan was done of her pelvis.  The most impressive finding was that she had fairly significant amount of ankylosis of the sacrum and proximal sacral coccygeal junction.  No sign of infection, but most of the inferior aspect of the coccyx appeared to be gone or not visible on the CT scan.  I reviewed the study with her and it is apparent that the lower 2 coccygeal segments are not readily visible within the CT area so the MRI scan was ordered.  The results of the MRI scan indicate that she has some bony residual of the lower 2 coccygeal segments.  There appears to be some calcification within the soft tissue adjacent to this area and I suspect that residual calcification may relate to the coccyx.  She has mild bilateral osteoarthritis of her SI joints noted on the study.  There is a focal abnormality at the tip of the coccyx suggesting a fat necrosis and fibrosis.  No drainable fluid collection.  A nodule is described along the  left side of the coccyx area with enhancing soft tissue area both T1 and T2 intermediate.  It is about a 10 cm hyperintense nodule at this area.  She is also noted to have some enlargement of her left piriformis that was felt to be physiologic.     ALLERGIES:   Phenergan.    CURRENT MEDICATIONS:   She is currently taking oxycodone for the coccyodynia she is experiencing.   Amitriptyline 150 mg a day, duloxetine 60 mg a day, gabapentin 300 mg at night, hydrochlorothiazide 25 mg a day, atorvastatin, acyclovir 400 mg, Amitiza 24 mcg a day, Dexilant 60 mg a day and ProAir.   PAST SURGICAL HISTORY:   Her previous surgeries have included cholecystectomy in the past.  She also had surgery for lung cancer, had a cyst removed 3 times, had bladder surgery.     FAMILY HISTORY:   Significant for lung cancer, high blood pressure and lung disease.      SOCIAL HISTORY:   She is married, husband is Clarene Essex.  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  She does not smoke and had smoked in the past, quit after 10 years of smoking.  She does not drink alcohol.    PAST MEDICAL HISTORY:  Significant for acid reflux, anxiety, arthritis, asthma, bladder  difficulties, cancer, depression, high blood pressure, stroke.    Past Medical History  Diagnosis Date  . HTN (hypertension)   . HLD (hyperlipidemia)   . Osteoarthrosis involving more than one site but not generalized   . Depression   . GERD (gastroesophageal reflux disease)   . Chronic abdominal pain   . Chronic joint pain   . Chronic back pain   . Cancer 1997    left lung  . Palpitations 01/2013    chronic, intermittent  . Nausea and vomiting     chronic, recurrent  . Asthma     every now and then.  Wheezing and coughing    Past Surgical History  Procedure Laterality Date  . Tubal ligation    . Abdominal hysterectomy    . Abdominal surgery      ovarian cyst removal  . Lung cancer surgery  1997    age 38, resection only  . Bladder surgery  suspension x4    x  4  . Lobectomy Left   . Excision vaginal cyst Left 06/20/2012    Procedure: EXCISION LEFT LABIAL CYST;  Surgeon: Jonnie Kind, MD;  Location: [facility];  Service: Gynecology;  Laterality: Left;  . Colonoscopy with propofol N/A 11/22/2013    Dr. Gala Romney: Grade 3 and 4/internal hemorrhoids?"likely source of hematochezia Normal colonoscopy otherwise.   . Esophagogastroduodenoscopy (egd) with propofol N/A 11/22/2013    Dr. Gala Romney: Incomplete Schatzki's ring dilated and disrupted as described above.  Small hiatal hernia. Focally abnormal gastric mucosa of uncertain significance status post biopsy, reactive gastropathy, negative H.pylori  . Maloney dilation N/A 11/22/2013    Procedure: [name] DILATION;  Surgeon: Daneil Dolin, MD;  Location: [facility];  Service: Endoscopy;  Laterality: N/A;  56  . Esophageal biopsy N/A 11/22/2013    Procedure: GASTRIC BIOPSY;  Surgeon: Daneil Dolin, MD;  Location: [facility];  Service: Endoscopy;  Laterality: N/A;  . Colonoscopy    . Cholecystectomy N/A 04/05/2014    Procedure: LAPAROSCOPIC CHOLECYSTECTOMY;  Surgeon: Aviva Signs Md, MD;  Location: [facility];  Service: General;  Laterality: N/A;    Family History  Problem Relation Age of Onset  . Hypertension Mother   . Kidney disease Mother   . Hypertension Father   . Kidney disease Father   . Heart disease Father   . Cancer Father     leukemia  . Hypertension Sister   . Hypertension Sister   . Diabetes Other   . Heart disease Other     has Psychologist, forensic  . Colon cancer Neg Hx    Social History:  reports that she quit smoking about 19 years ago. Her smoking use included Cigarettes. She has a 37.5 pack-year smoking history. She has never used smokeless tobacco. She reports that she does not drink alcohol or use illicit drugs.  Allergies:  Allergies  Allergen Reactions  . Promethazine Hcl Nausea And Vomiting and Other (See Comments)    "Makes my head hurt really bad too"    No prescriptions prior to admission     No results found for this or any previous visit (from the past 48 hour(s)). No results found.  Review of Systems  Constitutional: Negative.  Negative for fever, chills, weight loss, malaise/fatigue and diaphoresis.  HENT: Negative.  Negative for congestion, ear discharge, ear pain, hearing loss, nosebleeds, sore throat and tinnitus.   Eyes: Negative for blurred vision, double vision, photophobia, pain, discharge and redness.  Respiratory: Negative.  Negative for  cough, hemoptysis, sputum production, shortness of breath, wheezing and stridor.   Cardiovascular: Negative.  Negative for chest pain, palpitations, orthopnea, claudication, leg swelling and PND.  Gastrointestinal: Negative.  Negative for heartburn, nausea, vomiting, abdominal pain, diarrhea, constipation, blood in stool and melena.  Genitourinary: Negative.  Negative for dysuria, urgency, frequency, hematuria and flank pain.  Musculoskeletal: Positive for back pain. Negative for myalgias, joint pain, falls and neck pain.  Skin: Negative for itching and rash.  Neurological: Negative.  Negative for dizziness, tingling, tremors, sensory change, speech change, focal weakness, seizures, loss of consciousness, weakness and headaches.  Endo/Heme/Allergies: Negative for environmental allergies and polydipsia. Does not bruise/bleed easily.  Psychiatric/Behavioral: Negative.  Negative for depression, suicidal ideas, hallucinations, memory loss and substance abuse. The patient is not nervous/anxious and does not have insomnia.     There were no vitals taken for this visit. Physical Exam  Constitutional: She is oriented to person, place, and time. She appears well-developed.  HENT:  Head: Normocephalic.  Eyes: Pupils are equal, round, and reactive to light.  Neck: Normal range of motion.  Respiratory: No respiratory distress.  GI: She exhibits no distension.  Musculoskeletal: She exhibits tenderness.  Neurological: She is alert and  oriented to person, place, and time.  Skin: Skin is warm and dry.  Psychiatric: She has a normal mood and affect.    PHYSICAL EXAM:  Today I examined her tailbone.  She has a nodule to the left of the midline at the sacral coccygeal junction it is exquisitely tender to palpate and this likely represents an area of scar or inclusion cyst deep to the muscle and to the skin and fascia here.  It may relate to the source of her pain.  She also has SI joint changes which I do not think are necessarily associated with this nodule.     ASSESSMENT:  This patient has a nodule along the left side of the coccyx.  It appears to be soft tissue and hard.  MRI does not suggest that this is invasive or a neoplasia.     PLAN:  I think we should go ahead and do resection of the localized areas as the source of her discomfort.  We will schedule this to be done as an outpatient.  She can probably go home thereafter.  I will plan to use a Wilson type frame and use general anesthetic.  Plan to use local with Exparel to try to decrease the discomfort postoperatively and use Dermabond to seal the area post excision of this subcutaneous mass.  The risks of surgery including risk of infection, bleeding, neurologic compromise discussed with Zakariah and she wishes to proceed.  We will go ahead and schedule her then for outpatient resection of the subcutaneous mass left of the coccyx, to be done as an outpatient.  OWENS,Nile Dorning M 07/23/2014, 12:16 AM

## 2014-07-23 NOTE — Transfer of Care (Signed)
Immediate Anesthesia Transfer of Care Note  Patient: [name]  Procedure(s) Performed: Procedure(s): EXCISIONAL BIOPSY NODULE LEFT PARACOCCYGEAL AREA (N/A)  Patient Location: PACU  Anesthesia Type:General  Level of Consciousness: sedated  Airway & Oxygen Therapy: Patient Spontanous Breathing and Patient connected to face mask oxygen  Post-op Assessment: Report given to RN and Post -op Vital signs reviewed and stable  Post vital signs: Reviewed and stable  Last Vitals:  Filed Vitals:   07/23/14 0605  BP: 126/48  Pulse: 81  Temp: 36.9 C  Resp: 20    Complications: No apparent anesthesia complications

## 2014-07-23 NOTE — Brief Op Note (Signed)
07/23/2014  8:45 AM  PATIENT:  [name]  54 y.o. female  PRE-OPERATIVE DIAGNOSIS:  Left paracoccygeal mass  POST-OPERATIVE DIAGNOSIS:  Left paracoccygeal mass  PROCEDURE:  Procedure(s): EXCISIONAL BIOPSY NODULE LEFT PARACOCCYGEAL AREA (N/A)  SURGEON:  Surgeon(s) and Role:    * Jessy Oto, MD - Primary   ANESTHESIA:   local and general  EBL: <10cc  BLOOD ADMINISTERED:none  DRAINS: none   LOCAL MEDICATIONS USED:  MARCAINE 0.5% 1:1 EXPAREL 1.3% Amount: 7 ml  SPECIMEN:  Source of Specimen:  Subcutaneus left para sacrococcygeal. nodular ST mass 1.5cm x 1.0cm x 1.5cm  DISPOSITION OF SPECIMEN:  PATHOLOGY  COUNTS:  YES  TOURNIQUET:  * No tourniquets in log *  DICTATION: .Dragon Dictation  PLAN OF CARE: Discharge to home after PACU  PATIENT DISPOSITION:  PACU - hemodynamically stable.   Delay start of Pharmacological VTE agent (>24hrs) due to surgical blood loss or risk of bleeding: no

## 2014-07-23 NOTE — Op Note (Signed)
     07/23/2014  8:51 AM  PATIENT:  [name]  54 y.o. female  MRN: [id number]  OPERATIVE REPORT  PRE-OPERATIVE DIAGNOSIS:  Left paracoccygeal mass  POST-OPERATIVE DIAGNOSIS:  Left paracoccygeal mass  PROCEDURE:  Procedure(s): EXCISIONAL BIOPSY NODULE LEFT PARACOCCYGEAL AREA    SURGEON:  Jessy Oto, MD     ANESTHESIA:  General, supplemented with local infiltration with Marcaine0.5%/Exparel1.5% 1:1 total 7 mL    COMPLICATIONS:  None.   PROCEDURE: The patient was met in the holding area, and the appropriate left parasacrococcygeal subcutaneus soft tissue nodule identified and marked with an "X" and my initials.  The patient was then transported to OR and the patient was then placed under  general anesthesia without difficulty.  She was placed on the operative table in a prone position. The patient received appropriate preoperative antibiotic prophylaxis.The sacrococcygeal area was then prepped using sterile conditions and draped using sterile technique.  Time-out procedure was called and correct. The area of the left  Sacrococcygeal nodule was then marked with or marking pen. Infiltrated subcutaneously with marcaine and exparel solution 1-1. Total of 7 mL was used. Incision through skin and superficial subcutaneous layer. Metzenbaum scissors then used to carefully develop the layer about a long nodule 1.5 cm by and 1.0 cm by 1.5 cm. Specimen was resected and sent for pathologic diagnosis. A small amount of fluid noted at the surgery site and this was cultured both anaerobic and aerobic felt to probably represent the infiltrated local anesthetic agent. Cautery was used to control small bleeders. This was then irrigated there was no bleeding present and the deep layers of the subcutaneous area was then closed using 3-0 Vicryl sutures closing the deep soft tissue density space here. A running subcuticular stitch of 4-0 Vicryl to close the skin. Dermabond was applied allowed to dry. A  water impervious dressing using Tegaderm tincture of benzoin applied to the skin over a 2 x 2. Note that these subcutaneous layers were reinfiltrated prior to closure of the incision. All instrument and sponge counts were correct. Patient was then returned to the recovery room in satisfactory condition following turning to supine position and transferred to her stretcher and extubation.Marland Kitchen            [name] E  07/23/2014, 8:51 AM

## 2014-07-23 NOTE — Brief Op Note (Signed)
07/23/2014  8:27 AM  PATIENT:  [name]  54 y.o. female  PRE-OPERATIVE DIAGNOSIS:  Left paracoccygeal mass  POST-OPERATIVE DIAGNOSIS:  Left paracoccygeal mass  PROCEDURE:  Procedure(s): EXCISIONAL BIOPSY NODULE LEFT PARACOCCYGEAL AREA (N/A)  SURGEON:  Surgeon(s) and Role:    * Jessy Oto, MD - Primary  PHYSICIAN ASSISTANT:   ASSISTANTS: none   ANESTHESIA:   general  EBL:     BLOOD ADMINISTERED:none  DRAINS: none    SPECIMEN:  paracoccygeal soft tissue mass  DISPOSITION OF SPECIMEN:  PATHOLOGY  COUNTS:  YES  TOURNIQUET:  * No tourniquets in log *   PATIENT DISPOSITION:  PACU - hemodynamically stable.

## 2014-07-23 NOTE — Anesthesia Preprocedure Evaluation (Addendum)
Anesthesia Evaluation  Patient identified by MRN, date of birth, ID band Patient awake    Reviewed: Allergy & Precautions, NPO status , Patient's Chart, lab work & pertinent test results  Airway Mallampati: III  TM Distance: >3 FB Neck ROM: Full    Dental  (+) Edentulous Upper, Edentulous Lower   Pulmonary asthma , sleep apnea , former smoker,  breath sounds clear to auscultation        Cardiovascular hypertension, negative cardio ROS  Rhythm:Regular Rate:Normal     Neuro/Psych negative neurological ROS     GI/Hepatic Neg liver ROS, GERD-  ,  Endo/Other  negative endocrine ROS  Renal/GU negative Renal ROS     Musculoskeletal  (+) Arthritis -,   Abdominal   Peds  Hematology negative hematology ROS (+)   Anesthesia Other Findings   Reproductive/Obstetrics                            Anesthesia Physical Anesthesia Plan  ASA: II  Anesthesia Plan: General   Post-op Pain Management:    Induction: Intravenous  Airway Management Planned: Oral ETT  Additional Equipment:   Intra-op Plan:   Post-operative Plan: Extubation in OR  Informed Consent: I have reviewed the patients History and Physical, chart, labs and discussed the procedure including the risks, benefits and alternatives for the proposed anesthesia with the patient or authorized representative who has indicated his/her understanding and acceptance.   Dental advisory given  Plan Discussed with: CRNA  Anesthesia Plan Comments:         Anesthesia Quick Evaluation

## 2014-07-23 NOTE — Anesthesia Postprocedure Evaluation (Signed)
  Anesthesia Post-op Note  Patient: [name]  Procedure(s) Performed: Procedure(s): EXCISIONAL BIOPSY NODULE LEFT PARACOCCYGEAL AREA (N/A)  Patient Location: PACU  Anesthesia Type:General  Level of Consciousness: awake, alert  and oriented  Airway and Oxygen Therapy: Patient Spontanous Breathing  Post-op Pain: mild  Post-op Assessment: Post-op Vital signs reviewed              Post-op Vital Signs: Reviewed  Last Vitals:  Filed Vitals:   07/23/14 1121  BP: 126/62  Pulse: 96  Temp:   Resp: 18    Complications: No apparent anesthesia complications

## 2014-07-23 NOTE — Progress Notes (Signed)
Dr r fitzgerald at bedside/chin lift in progress / had placed add'l nasal trumpet in addition to oral airway arrived with/ nonresponsive

## 2014-07-23 NOTE — Interval H&P Note (Signed)
History and Physical Interval Note:  07/23/2014 7:23 AM  [name]  has presented today for surgery, with the diagnosis of Left paracoccygeal mass  The various methods of treatment have been discussed with the patient and family. After consideration of risks, benefits and other options for treatment, the patient has consented to  Procedure(s): EXCISIONAL BIOPSY NODULE LEFT PARACOCCYGEAL AREA (N/A) as a surgical intervention .  The patient's history has been reviewed, patient examined, no change in status, stable for surgery.  I have reviewed the patient's chart and labs.  Questions were answered to the patient's satisfaction.     Coda Filler E

## 2014-07-24 ENCOUNTER — Encounter (HOSPITAL_COMMUNITY): Payer: Self-pay | Admitting: Specialist

## 2014-07-27 LAB — TISSUE CULTURE: Culture: NO GROWTH

## 2014-07-28 LAB — ANAEROBIC CULTURE

## 2014-07-29 ENCOUNTER — Telehealth: Payer: Self-pay

## 2014-07-29 NOTE — Telephone Encounter (Signed)
Pt called and states that she had her gallbladder removed in March and that Dr. Arnoldo Morale took her off the [place] and she wants to know if she should still be taking it. Please advise

## 2014-07-30 ENCOUNTER — Telehealth: Payer: Self-pay | Admitting: Internal Medicine

## 2014-07-30 NOTE — Telephone Encounter (Signed)
Routing to [place]den Field, NP

## 2014-07-30 NOTE — Telephone Encounter (Signed)
Pt is calling to see if she really the medication because she has been with out it for a couple of days. She would like to know something today if possible

## 2014-07-30 NOTE — Telephone Encounter (Signed)
She'll need to be re-evaluated in the office s/p chole to determine if she's still symptomatic and if there's a need to continue Amitiza.

## 2014-07-30 NOTE — Telephone Encounter (Signed)
Pt called today saying that she had called yesterday and wanted to know should she continue taking her Amitiza. CJ had spoke to her yesterday. Please advise and call patient back at [phone]

## 2014-07-31 DIAGNOSIS — R203 Hyperesthesia: Secondary | ICD-10-CM | POA: Diagnosis not present

## 2014-07-31 DIAGNOSIS — M4317 Spondylolisthesis, lumbosacral region: Secondary | ICD-10-CM | POA: Diagnosis not present

## 2014-07-31 NOTE — Telephone Encounter (Signed)
Called and spoke with pt and she will call us back to set up appointment.

## 2014-08-09 DIAGNOSIS — R202 Paresthesia of skin: Secondary | ICD-10-CM | POA: Diagnosis not present

## 2014-08-19 ENCOUNTER — Other Ambulatory Visit: Payer: Self-pay | Admitting: Family Medicine

## 2014-08-22 ENCOUNTER — Other Ambulatory Visit: Payer: Self-pay | Admitting: Family Medicine

## 2014-08-22 DIAGNOSIS — A609 Anogenital herpesviral infection, unspecified: Secondary | ICD-10-CM

## 2014-08-22 NOTE — Telephone Encounter (Signed)
i received thi s today, she's yours now.    Thanks and sorry! Sam

## 2014-08-23 NOTE — Telephone Encounter (Signed)
Sent refill.  [name], [place], PGY-3

## 2014-09-18 ENCOUNTER — Other Ambulatory Visit: Payer: Self-pay | Admitting: Gastroenterology

## 2014-10-26 ENCOUNTER — Other Ambulatory Visit: Payer: Self-pay | Admitting: Family Medicine

## 2014-10-29 ENCOUNTER — Other Ambulatory Visit: Payer: Self-pay | Admitting: Family Medicine

## 2014-10-30 ENCOUNTER — Other Ambulatory Visit: Payer: Self-pay | Admitting: Family Medicine

## 2014-10-30 MED ORDER — AMITRIPTYLINE HCL 150 MG PO TABS: 150.0000 mg | ORAL_TABLET | Freq: Every day | ORAL | Status: DC

## 2014-10-30 NOTE — Telephone Encounter (Signed)
Needs refill on amitriptylin

## 2014-11-13 ENCOUNTER — Ambulatory Visit (INDEPENDENT_AMBULATORY_CARE_PROVIDER_SITE_OTHER): Payer: Medicare Other | Admitting: Family Medicine

## 2014-11-13 ENCOUNTER — Encounter: Payer: Self-pay | Admitting: Family Medicine

## 2014-11-13 VITALS — BP 137/60 | HR 74 | Temp 98.4°F | Ht 66.0 in | Wt 175.7 lb

## 2014-11-13 DIAGNOSIS — M150 Primary generalized (osteo)arthritis: Secondary | ICD-10-CM | POA: Diagnosis not present

## 2014-11-13 DIAGNOSIS — I10 Essential (primary) hypertension: Secondary | ICD-10-CM

## 2014-11-13 DIAGNOSIS — M159 Polyosteoarthritis, unspecified: Secondary | ICD-10-CM

## 2014-11-13 DIAGNOSIS — M8949 Other hypertrophic osteoarthropathy, multiple sites: Secondary | ICD-10-CM

## 2014-11-13 MED ORDER — MELOXICAM 15 MG PO TABS: 15.0000 mg | ORAL_TABLET | Freq: Every day | ORAL | Status: DC

## 2014-11-13 NOTE — Progress Notes (Signed)
Subjective:    Patient ID: [name], female    DOB: 06/26/1960, 54 y.o.   MRN: [id number]  [name] is a 54 y.o. female presenting on 11/13/2014 for meet new doctor and Medication Refill  HPI  OSTEOARTHRITIS, MULTIPLE SITES: - Reports chronic history of osteoarthritis in both hands, knees, hips for past several years, seems to be gradually worsening. Concerned with her limitations on ambulation at times or difficulty grasping objects sometimes. Regular exercise with walking, dancing, staying moving. Currently not employed, on disability for arthritis. - Last imaging X-rays (2012) with hands (no acute findings, no evidence arthritis), knees (Mild degenerative changes medial compartment left knee.) - Current treatments Voltaren gel (now without relief), Amitriptyline '150mg'$  morning daily, Cymbalta 60 daily, Gabapentin '300mg'$  (TID) - Followed by Dr. Louanne Skye ([place]) - Currently not employed, feels like arthritis is limiting her daily activities  CHRONIC HTN: Reports no concerns today Current Meds - HCTZ '25mg'$  daily   Reports good compliance, took meds today. Tolerating well, w/o complaints. Denies CP, dyspnea, HA, edema, dizziness / lightheadedness  Additionally requested copies of EGD / Colonoscopy reports and Pathology from pilonidal cyst.   Past Medical History  Diagnosis Date  . HTN (hypertension)   . HLD (hyperlipidemia)   . Osteoarthrosis involving more than one site but not generalized   . Depression   . GERD (gastroesophageal reflux disease)   . Chronic abdominal pain   . Chronic joint pain   . Chronic back pain   . Cancer ([place]) 1997    left lung  . Palpitations 01/2013    chronic, intermittent  . Nausea and vomiting     chronic, recurrent  . Asthma     every now and then.  Wheezing and coughing    Social History   Social History  . Marital Status: Married    Spouse Name: N/A  . Number of Children: 3  . Years of Education: N/A   Occupational  History  . Not on file.   Social History Main Topics  . Smoking status: Former Smoker -- 1.50 packs/day for 25 years    Types: Cigarettes    Quit date: 04/07/1995  . Smokeless tobacco: Never Used  . Alcohol Use: No  . Drug Use: No  . Sexual Activity: Not Currently    Birth Control/ Protection: Surgical   Other Topics Concern  . Not on file   Social History Narrative    Current Outpatient Prescriptions on File Prior to Visit  Medication Sig  . acyclovir (ZOVIRAX) 400 MG tablet TAKE ONE TABLET BY MOUTH TWICE DAILY  . albuterol (PROAIR HFA) 108 (90 BASE) MCG/ACT inhaler Inhale 2 puffs into the lungs every 4 (four) hours as needed. for shortness of breath  . amitriptyline (ELAVIL) 150 MG tablet Take 1 tablet (150 mg total) by mouth daily with breakfast.  . atorvastatin (LIPITOR) 40 MG tablet TAKE ONE TABLET BY MOUTH ONCE DAILY  . Calcium Citrate-Vitamin D 250-200 MG-UNIT TABS Take 1 tablet by mouth daily.   Marland Kitchen DEXILANT 60 MG capsule TAKE ONE CAPSULE BY MOUTH ONCE DAILY  . DULoxetine (CYMBALTA) 60 MG capsule Take 1 capsule (60 mg total) by mouth 2 (two) times daily.  Marland Kitchen gabapentin (NEURONTIN) 300 MG capsule Take 1 capsule (300 mg total) by mouth 3 (three) times daily.  . hydrochlorothiazide (HYDRODIURIL) 25 MG tablet Take 1 tablet (25 mg total) by mouth daily.  Marland Kitchen lubiprostone (AMITIZA) 24 MCG capsule Take 1 capsule (24 mcg total) by  mouth 2 (two) times daily with a meal.   No current facility-administered medications on file prior to visit.    Review of Systems  Constitutional: Negative for fever, chills, diaphoresis, activity change, appetite change and fatigue.  Gastrointestinal: Negative for nausea, vomiting, abdominal pain, diarrhea and constipation.  Musculoskeletal: Positive for joint swelling (none active, some hand joint swelling) and arthralgias (bilateral hands, knees, hips, R > L). Negative for myalgias and neck pain.  Skin: Negative for rash.  Neurological: Negative for  dizziness, weakness, light-headedness, numbness and headaches.   Per HPI unless specifically indicated above     Objective:    BP 137/60 mmHg  Pulse 74  Temp(Src) 98.4 F (36.9 C) (Oral)  Ht '5\' 6"'$  (1.676 m)  Wt 175 lb 11.2 oz (79.697 kg)  BMI 28.37 kg/m2  Wt Readings from Last 3 Encounters:  11/13/14 175 lb 11.2 oz (79.697 kg)  07/23/14 179 lb (81.194 kg)  07/16/14 179 lb 3.7 oz (81.3 kg)    Physical Exam  Constitutional: She is oriented to person, place, and time. She appears well-developed and well-nourished. No distress.  HENT:  Head: Normocephalic and atraumatic.  Mouth/Throat: Oropharynx is clear and moist.  Neck: Normal range of motion. Neck supple.  Cardiovascular: Normal rate, regular rhythm, normal heart sounds and intact distal pulses.   No murmur heard. Pulmonary/Chest: Effort normal and breath sounds normal.  Musculoskeletal: Normal range of motion. She exhibits no edema or tenderness.  Bilateral hands without edema, erythema, non-tender to palpation. Good ROM, intact grip str 5/5, no nodules or chronic joint changes. Bilateral knees appear normal without excess bulky appearance, non-tender to palpation, no effusion, mild +crepitus bilaterally, joint line non-tender, no elicited pain on valgus / varus testing. Hips non-tender to compression, active ROM intact. Able to ambulate without difficulty  Neurological: She is alert and oriented to person, place, and time.  Skin: Skin is warm and dry. No rash noted. She is not diaphoretic.  Nursing note and vitals reviewed.  Results for orders placed or performed during the hospital encounter of 07/23/14  Anaerobic culture  Result Value Ref Range   Specimen Description TISSUE    Special Requests LEFT PARACOCCYGEAL MASS PT ON ANCEF    Gram Stain      RARE WBC PRESENT, PREDOMINANTLY PMN NO ORGANISMS SEEN Performed at [facility]    Culture      NO ANAEROBES ISOLATED Performed at [facility]    Report  Status 07/28/2014 FINAL   Tissue culture  Result Value Ref Range   Specimen Description TISSUE    Special Requests LEFT PARACOCCYGEAL MASS PT ON ANCEF    Gram Stain      RARE WBC PRESENT, PREDOMINANTLY PMN NO ORGANISMS SEEN Performed at [facility]    Culture      NO GROWTH 3 DAYS Performed at [facility]    Report Status 07/27/2014 FINAL       Assessment & Plan:   Problem List Items Addressed This Visit      Cardiovascular and Mediastinum   HYPERTENSION, BENIGN SYSTEMIC - Primary    Controlled HTN Meds - HCTZ '25mg'$  daily No complications   Plan:  1. Continue current BP monotherapy 2. Not due for labs today 3. Continue stay active, counseled on low salt diet         Musculoskeletal and Integument   Osteoarthritis of multiple joints    Gradual worsening chronic OA multiple sites (hands, knees, hips) over past 3-4 years. -  Followed by Dr. Louanne Skye ([place]), has not had any surgeries or injections (per report) - Last imaging X-ray (2012) Hands negative, Knees with mild DJD medial L-knee - Previously on meloxicam with improvement, not improving on voltaren topical, also on amitriptyline, cymbalta, and gabapentin  Plan: 1. Resume trial on Meloxicam '15mg'$  daily for 1-3 month trial. Discontinue topical voltaren 2. Continue existing other chronic pain medicines 3. Start regular Tylenol use for OA, other conservative therapies with heating pad 4. Follow-up with Ortho, RTC 3-6 months, consider knee injections      Relevant Medications   meloxicam (MOBIC) 15 MG tablet      Meds ordered this encounter  Medications  . meloxicam (MOBIC) 15 MG tablet    Sig: Take 1 tablet (15 mg total) by mouth daily.    Dispense:  30 tablet    Refill:  2      Follow up plan: No Follow-up on file.  A total of 15 minutes was spent face-to-face with this patient. Over half this time was spent on counseling patient on the diagnosis and different diagnostic and  therapeutic options available.  [name], [place], PGY-3

## 2014-11-13 NOTE — Patient Instructions (Addendum)
Thank you for coming in to clinic today.  1. You have Osteoarthritis and are on several correct medicines, I would like to try an oral anti-inflammatory Meloxicam (Mobic) '15mg'$  daily once daily - take with food and water. - DO NOT TAKE any ibuprofen, aleve, motrin while you are taking this medicine - It is safe to take Tylenol Ext Str '500mg'$  tabs - take 1 to 2 (max dose '1000mg'$ ) every 6 hours as needed for breakthrough pain, max 24 hour daily dose is 6 to 8 tablets or '4000mg'$  - You may try Chondroitin Sulfate supplement as well - Stay active, use heating pad  2. Follow up with Dr. Louanne Skye  Recommended Flu shot, TDap, and Mammogram today  Please schedule a follow-up appointment with Dr. Parks Ranger in 3 to 6 months as needed  If you have any other questions or concerns, please feel free to call the clinic to contact me. You may also schedule an earlier appointment if necessary.  However, if your symptoms get significantly worse, please go to the Emergency Department to seek immediate medical attention.  [name], [name]

## 2014-11-14 NOTE — Assessment & Plan Note (Signed)
Controlled HTN Meds - HCTZ '25mg'$  daily No complications   Plan:  1. Continue current BP monotherapy 2. Not due for labs today 3. Continue stay active, counseled on low salt diet

## 2014-11-14 NOTE — Assessment & Plan Note (Signed)
Gradual worsening chronic OA multiple sites (hands, knees, hips) over past 3-4 years. - Followed by Dr. Louanne Skye ([place]), has not had any surgeries or injections (per report) - Last imaging X-ray (2012) Hands negative, Knees with mild DJD medial L-knee - Previously on meloxicam with improvement, not improving on voltaren topical, also on amitriptyline, cymbalta, and gabapentin  Plan: 1. Resume trial on Meloxicam '15mg'$  daily for 1-3 month trial. Discontinue topical voltaren 2. Continue existing other chronic pain medicines 3. Start regular Tylenol use for OA, other conservative therapies with heating pad 4. Follow-up with Ortho, RTC 3-6 months, consider knee injections

## 2014-11-19 ENCOUNTER — Other Ambulatory Visit: Payer: Self-pay | Admitting: Family Medicine

## 2014-11-19 MED ORDER — AMITRIPTYLINE HCL 150 MG PO TABS: 150.0000 mg | ORAL_TABLET | Freq: Every day | ORAL | Status: DC

## 2014-11-20 DIAGNOSIS — M797 Fibromyalgia: Secondary | ICD-10-CM | POA: Diagnosis not present

## 2014-11-20 DIAGNOSIS — M4317 Spondylolisthesis, lumbosacral region: Secondary | ICD-10-CM | POA: Diagnosis not present

## 2014-11-22 ENCOUNTER — Other Ambulatory Visit: Payer: Self-pay | Admitting: Family Medicine

## 2014-11-22 ENCOUNTER — Other Ambulatory Visit: Payer: Self-pay | Admitting: Specialist

## 2014-11-25 ENCOUNTER — Other Ambulatory Visit (HOSPITAL_COMMUNITY): Payer: Self-pay | Admitting: Specialist

## 2014-11-25 ENCOUNTER — Other Ambulatory Visit: Payer: Self-pay | Admitting: Specialist

## 2014-11-25 DIAGNOSIS — M545 Low back pain, unspecified: Secondary | ICD-10-CM

## 2014-12-02 ENCOUNTER — Other Ambulatory Visit: Payer: Self-pay | Admitting: Family Medicine

## 2014-12-02 ENCOUNTER — Other Ambulatory Visit: Payer: Self-pay

## 2014-12-02 DIAGNOSIS — R748 Abnormal levels of other serum enzymes: Secondary | ICD-10-CM

## 2014-12-02 DIAGNOSIS — M159 Polyosteoarthritis, unspecified: Secondary | ICD-10-CM

## 2014-12-02 NOTE — Telephone Encounter (Signed)
Pt called and needs a refill on her Gabapentin called in. jw

## 2014-12-06 ENCOUNTER — Ambulatory Visit
Admission: RE | Admit: 2014-12-06 | Discharge: 2014-12-06 | Disposition: A | Discharge status: Home / Self Care | Payer: Medicare Other | Source: Ambulatory Visit | Attending: Specialist | Admitting: Specialist

## 2014-12-06 DIAGNOSIS — M545 Low back pain, unspecified: Secondary | ICD-10-CM

## 2014-12-06 DIAGNOSIS — M4806 Spinal stenosis, lumbar region: Secondary | ICD-10-CM | POA: Diagnosis not present

## 2014-12-11 DIAGNOSIS — M4806 Spinal stenosis, lumbar region: Secondary | ICD-10-CM | POA: Diagnosis not present

## 2014-12-11 DIAGNOSIS — M4317 Spondylolisthesis, lumbosacral region: Secondary | ICD-10-CM | POA: Diagnosis not present

## 2014-12-18 ENCOUNTER — Other Ambulatory Visit: Payer: Self-pay | Admitting: Family Medicine

## 2014-12-18 DIAGNOSIS — R748 Abnormal levels of other serum enzymes: Secondary | ICD-10-CM | POA: Diagnosis not present

## 2014-12-19 LAB — HEPATIC FUNCTION PANEL
ALBUMIN: 4 g/dL (ref 3.6–5.1)
ALT: 11 U/L (ref 6–29)
AST: 13 U/L (ref 10–35)
Alkaline Phosphatase: 165 U/L — ABNORMAL HIGH (ref 33–130)
BILIRUBIN DIRECT: 0.1 mg/dL (ref <=0.2)
BILIRUBIN TOTAL: 0.5 mg/dL (ref 0.2–1.2)
Indirect Bilirubin: 0.4 mg/dL (ref 0.2–1.2)
Total Protein: 7 g/dL (ref 6.1–8.1)

## 2014-12-23 ENCOUNTER — Other Ambulatory Visit: Payer: Self-pay | Admitting: Nurse Practitioner

## 2014-12-23 ENCOUNTER — Other Ambulatory Visit: Payer: Self-pay | Admitting: *Deleted

## 2014-12-23 DIAGNOSIS — F329 Major depressive disorder, single episode, unspecified: Secondary | ICD-10-CM

## 2014-12-23 DIAGNOSIS — F32A Depression, unspecified: Secondary | ICD-10-CM

## 2014-12-23 DIAGNOSIS — M159 Polyosteoarthritis, unspecified: Secondary | ICD-10-CM

## 2014-12-23 MED ORDER — DULOXETINE HCL 60 MG PO CPEP: 60.0000 mg | ORAL_CAPSULE | Freq: Two times a day (BID) | ORAL | Status: DC

## 2014-12-25 ENCOUNTER — Other Ambulatory Visit (HOSPITAL_COMMUNITY): Payer: Self-pay | Admitting: Specialist

## 2014-12-26 ENCOUNTER — Other Ambulatory Visit: Payer: Self-pay | Admitting: Gastroenterology

## 2014-12-26 DIAGNOSIS — R748 Abnormal levels of other serum enzymes: Secondary | ICD-10-CM

## 2014-12-26 NOTE — Progress Notes (Signed)
Quick Note:  Continued mild elevation of alk phos. Repeat in 6 months. ______ 

## 2014-12-27 ENCOUNTER — Other Ambulatory Visit: Payer: Self-pay | Admitting: *Deleted

## 2014-12-27 DIAGNOSIS — I10 Essential (primary) hypertension: Secondary | ICD-10-CM

## 2014-12-27 MED ORDER — HYDROCHLOROTHIAZIDE 25 MG PO TABS: 25.0000 mg | ORAL_TABLET | Freq: Every day | ORAL | Status: DC

## 2015-01-01 ENCOUNTER — Encounter (HOSPITAL_COMMUNITY): Payer: Self-pay

## 2015-01-01 ENCOUNTER — Encounter (HOSPITAL_COMMUNITY)
Admission: RE | Admit: 2015-01-01 | Discharge: 2015-01-01 | Disposition: A | Discharge status: Home / Self Care | Payer: Medicare Other | Source: Ambulatory Visit | Attending: Specialist | Admitting: Specialist

## 2015-01-01 DIAGNOSIS — M4806 Spinal stenosis, lumbar region: Secondary | ICD-10-CM | POA: Diagnosis not present

## 2015-01-01 DIAGNOSIS — M5416 Radiculopathy, lumbar region: Secondary | ICD-10-CM | POA: Diagnosis not present

## 2015-01-01 DIAGNOSIS — M4316 Spondylolisthesis, lumbar region: Secondary | ICD-10-CM | POA: Diagnosis not present

## 2015-01-01 DIAGNOSIS — I10 Essential (primary) hypertension: Secondary | ICD-10-CM | POA: Diagnosis not present

## 2015-01-01 HISTORY — DX: Reserved for inherently not codable concepts without codable children: IMO0001

## 2015-01-01 LAB — COMPREHENSIVE METABOLIC PANEL
ALBUMIN: 3.7 g/dL (ref 3.5–5.0)
ALK PHOS: 132 U/L — AB (ref 38–126)
ALT: 16 U/L (ref 14–54)
ANION GAP: 7 (ref 5–15)
AST: 17 U/L (ref 15–41)
BILIRUBIN TOTAL: 0.4 mg/dL (ref 0.3–1.2)
CO2: 32 mmol/L (ref 22–32)
Calcium: 8.9 mg/dL (ref 8.9–10.3)
Chloride: 100 mmol/L — ABNORMAL LOW (ref 101–111)
Creatinine, Ser: 0.89 mg/dL (ref 0.44–1.00)
GFR calc Af Amer: >60 mL/min (ref >60)
GFR calc non Af Amer: >60 mL/min (ref >60)
GLUCOSE: 99 mg/dL (ref 65–99)
Potassium: 3 mmol/L — ABNORMAL LOW (ref 3.5–5.1)
SODIUM: 139 mmol/L (ref 135–145)
Total Protein: 7.4 g/dL (ref 6.5–8.1)

## 2015-01-01 LAB — SURGICAL PCR SCREEN
MRSA, PCR: NEGATIVE
Staphylococcus aureus: NEGATIVE

## 2015-01-01 LAB — TYPE AND SCREEN
ABO/RH(D): O POS
Antibody Screen: NEGATIVE

## 2015-01-01 LAB — CBC
HEMATOCRIT: 38.5 % (ref 36.0–46.0)
HEMOGLOBIN: 12.9 g/dL (ref 12.0–15.0)
MCH: 31.5 pg (ref 26.0–34.0)
MCHC: 33.5 g/dL (ref 30.0–36.0)
MCV: 94.1 fL (ref 78.0–100.0)
Platelets: 263 10*3/uL (ref 150–400)
RBC: 4.09 MIL/uL (ref 3.87–5.11)
RDW: 12.5 % (ref 11.5–15.5)
WBC: 6.1 10*3/uL (ref 4.0–10.5)

## 2015-01-01 NOTE — Pre-Procedure Instructions (Signed)
    [name]  01/01/2015      [facility] - [place] [address], [address] [place] [address] Phone: [phone] Fax: [phone]  [facility] [place] - [address] [place] [address] Phone: [phone] Fax: [phone]    Your procedure is scheduled on Friday, Dec. 16  Report to [facility] [place] Admitting at 10:30 A.M.  Call this number if you have problems the morning of surgery:  [phone]               Any questions prior to surgery call [phone] Monday- Friday 8am-4pm   Remember:  Do not eat food or drink liquids after midnight.  Take these medicines the morning of surgery with A SIP OF WATER :acyclovir, albuterol inhaler-bring to hospital, dexilant, cymbalta, gabapentin              Stop mobic today.    Do not wear jewelry, make-up or nail polish.  Do not wear lotions, powders, or perfumes.  You may wear deodorant.  Do not shave 48 hours prior to surgery.  Men may shave face and neck.  Do not bring valuables to the hospital.  [facility] is not responsible for any belongings or valuables.  Contacts, dentures or bridgework may not be worn into surgery.  Leave your suitcase in the car.  After surgery it may be brought to your room.  For patients admitted to the hospital, discharge time will be determined by your treatment team.  Patients discharged the day of surgery will not be allowed to drive home.    Special instructions: review preparing for surgery  Please read over the following fact sheets that you were given. Pain Booklet, Coughing and Deep Breathing, Blood Transfusion Information and Surgical Site Infection Prevention

## 2015-01-01 NOTE — Progress Notes (Signed)
PCP: [facility]

## 2015-01-02 LAB — ABO/RH: ABO/RH(D): O POS

## 2015-01-02 NOTE — H&P (Signed)
[name] is an 54 y.o. female.   Patient with left greater than right LE radiculopathy.  Progressively worsening symptoms.  Failed conservative treatment.  MRI showed severe L3-4 spinal stenosis.   Past Medical History  Diagnosis Date  . HTN (hypertension)   . HLD (hyperlipidemia)   . Osteoarthrosis involving more than one site but not generalized   . Depression   . GERD (gastroesophageal reflux disease)   . Chronic abdominal pain   . Chronic joint pain   . Chronic back pain   . Cancer ([place]) 1997    left lung  . Palpitations 01/2013    chronic, intermittent  . Nausea and vomiting     chronic, recurrent  . Asthma     every now and then.  Wheezing and coughing  . Shortness of breath dyspnea     with exertion    Past Surgical History  Procedure Laterality Date  . Tubal ligation    . Abdominal hysterectomy    . Abdominal surgery      ovarian cyst removal  . Lung cancer surgery  1997    age 63, resection only  . Bladder surgery  suspension x4    x 4  . Lobectomy Left   . Excision vaginal cyst Left 06/20/2012    Procedure: EXCISION LEFT LABIAL CYST;  Surgeon: Jonnie Kind, MD;  Location: [facility];  Service: Gynecology;  Laterality: Left;  . Colonoscopy with propofol N/A 11/22/2013    Dr. Gala Romney: Grade 3 and 4/internal hemorrhoids?"likely source of hematochezia Normal colonoscopy otherwise.   . Esophagogastroduodenoscopy (egd) with propofol N/A 11/22/2013    Dr. Gala Romney: Incomplete Schatzki's ring dilated and disrupted as described above.  Small hiatal hernia. Focally abnormal gastric mucosa of uncertain significance status post biopsy, reactive gastropathy, negative H.pylori  . Maloney dilation N/A 11/22/2013    Procedure: [name] DILATION;  Surgeon: Daneil Dolin, MD;  Location: [facility];  Service: Endoscopy;  Laterality: N/A;  56  . Esophageal biopsy N/A 11/22/2013    Procedure: GASTRIC BIOPSY;  Surgeon: Daneil Dolin, MD;  Location: [facility];  Service: Endoscopy;  Laterality: N/A;   . Colonoscopy    . Cholecystectomy N/A 04/05/2014    Procedure: LAPAROSCOPIC CHOLECYSTECTOMY;  Surgeon: Aviva Signs Md, MD;  Location: [facility];  Service: General;  Laterality: N/A;  . Mass excision N/A 07/23/2014    Procedure: EXCISIONAL BIOPSY NODULE LEFT PARACOCCYGEAL AREA;  Surgeon: Jessy Oto, MD;  Location: [place];  Service: Orthopedics;  Laterality: N/A;    Family History  Problem Relation Age of Onset  . Hypertension Mother   . Kidney disease Mother   . Hypertension Father   . Kidney disease Father   . Heart disease Father   . Cancer Father     leukemia  . Hypertension Sister   . Hypertension Sister   . Diabetes Other   . Heart disease Other     has Psychologist, forensic  . Colon cancer Neg Hx    Social History:  reports that she quit smoking about 19 years ago. Her smoking use included Cigarettes. She has a 37.5 pack-year smoking history. She has never used smokeless tobacco. She reports that she does not drink alcohol or use illicit drugs.  Allergies:  Allergies  Allergen Reactions  . Promethazine Hcl Nausea And Vomiting and Other (See Comments)    "Makes my head hurt really bad too"    No prescriptions prior to admission    Results for orders  placed or performed during the hospital encounter of 01/01/15 (from the past 48 hour(s))  Type and screen Order type and screen if day of surgery is less than 15 days from draw of preadmission visit or order morning of surgery if day of surgery is greater than 6 days from preadmission visit.     Status: None   Collection Time: 01/01/15  9:25 AM  Result Value Ref Range   ABO/RH(D) O POS    Antibody Screen NEG    Sample Expiration 01/15/2015    Extend sample reason NO TRANSFUSIONS OR PREGNANCY IN THE PAST 3 MONTHS   Surgical pcr screen     Status: None   Collection Time: 01/01/15  9:33 AM  Result Value Ref Range   MRSA, PCR NEGATIVE NEGATIVE   Staphylococcus aureus NEGATIVE NEGATIVE    Comment:        The Xpert SA Assay  (FDA approved for NASAL specimens in patients over 48 years of age), is one component of a comprehensive surveillance program.  Test performance has been validated by [facility] for patients greater than or equal to 26 year old. It is not intended to diagnose infection nor to guide or monitor treatment.   CBC     Status: None   Collection Time: 01/01/15  9:34 AM  Result Value Ref Range   WBC 6.1 4.0 - 10.5 K/uL   RBC 4.09 3.87 - 5.11 MIL/uL   Hemoglobin 12.9 12.0 - 15.0 g/dL   HCT 38.5 36.0 - 46.0 %   MCV 94.1 78.0 - 100.0 fL   MCH 31.5 26.0 - 34.0 pg   MCHC 33.5 30.0 - 36.0 g/dL   RDW 12.5 11.5 - 15.5 %   Platelets 263 150 - 400 K/uL  Comprehensive metabolic panel     Status: Abnormal   Collection Time: 01/01/15  9:34 AM  Result Value Ref Range   Sodium 139 135 - 145 mmol/L   Potassium 3.0 (L) 3.5 - 5.1 mmol/L   Chloride 100 (L) 101 - 111 mmol/L   CO2 32 22 - 32 mmol/L   Glucose, Bld 99 65 - 99 mg/dL   BUN <5 (L) 6 - 20 mg/dL   Creatinine, Ser 0.89 0.44 - 1.00 mg/dL   Calcium 8.9 8.9 - 10.3 mg/dL   Total Protein 7.4 6.5 - 8.1 g/dL   Albumin 3.7 3.5 - 5.0 g/dL   AST 17 15 - 41 U/L   ALT 16 14 - 54 U/L   Alkaline Phosphatase 132 (H) 38 - 126 U/L   Total Bilirubin 0.4 0.3 - 1.2 mg/dL   GFR calc non Af Amer >60 >60 mL/min   GFR calc Af Amer >60 >60 mL/min    Comment: (NOTE) The eGFR has been calculated using the CKD EPI equation. This calculation has not been validated in all clinical situations. eGFR's persistently <60 mL/min signify possible Chronic Kidney Disease.    Anion gap 7 5 - 15   No results found.  Review of Systems  Constitutional: Negative.   HENT: Negative.   Respiratory: Negative.   Gastrointestinal: Negative.   Genitourinary: Negative.   Musculoskeletal: Positive for back pain.  Skin: Negative.   Psychiatric/Behavioral: Negative.     There were no vitals taken for this visit. Physical Exam  Constitutional: She is oriented to person,  place, and time. No distress.  HENT:  Head: Atraumatic.  Eyes: EOM are normal.  Neck: Normal range of motion.  Cardiovascular: Normal rate.   Respiratory:  No respiratory distress.  GI: She exhibits no distension.  Musculoskeletal: She exhibits tenderness.  Neurological: She is alert and oriented to person, place, and time.  Skin: Skin is warm and dry.  Psychiatric: She has a normal mood and affect.     Assessment/Plan Severe L3-4 spinal stenosis, low back pain and left >right LE radiculopathy.   Will proceed with L3-4 TLIF as scheduled.  Surgical procedure along with possible risks and complications discussed. All questions answered.    OWENS,Sander Speckman M 01/02/2015, 4:32 PM   Patient examined and lab reviewed with Ricard Dillon, PA-C.

## 2015-01-03 ENCOUNTER — Inpatient Hospital Stay (HOSPITAL_COMMUNITY): Payer: Medicare Other

## 2015-01-03 ENCOUNTER — Inpatient Hospital Stay (HOSPITAL_COMMUNITY)
Admission: RE | Admit: 2015-01-03 | Discharge: 2015-01-05 | DRG: 460 | Disposition: A | Discharge status: Home / Self Care | Payer: Medicare Other | Source: Ambulatory Visit | Attending: Specialist | Admitting: Specialist

## 2015-01-03 ENCOUNTER — Inpatient Hospital Stay (HOSPITAL_COMMUNITY): Payer: Medicare Other | Admitting: Anesthesiology

## 2015-01-03 ENCOUNTER — Encounter (HOSPITAL_COMMUNITY): Payer: Self-pay | Admitting: Anesthesiology

## 2015-01-03 ENCOUNTER — Encounter (HOSPITAL_COMMUNITY)
Admission: RE | Disposition: A | Discharge status: Home / Self Care | Payer: Medicare Other | Source: Ambulatory Visit | Attending: Specialist

## 2015-01-03 DIAGNOSIS — I10 Essential (primary) hypertension: Secondary | ICD-10-CM | POA: Diagnosis present

## 2015-01-03 DIAGNOSIS — F329 Major depressive disorder, single episode, unspecified: Secondary | ICD-10-CM | POA: Diagnosis present

## 2015-01-03 DIAGNOSIS — M549 Dorsalgia, unspecified: Secondary | ICD-10-CM | POA: Diagnosis not present

## 2015-01-03 DIAGNOSIS — M5416 Radiculopathy, lumbar region: Secondary | ICD-10-CM | POA: Diagnosis present

## 2015-01-03 DIAGNOSIS — K219 Gastro-esophageal reflux disease without esophagitis: Secondary | ICD-10-CM | POA: Diagnosis present

## 2015-01-03 DIAGNOSIS — E785 Hyperlipidemia, unspecified: Secondary | ICD-10-CM | POA: Diagnosis present

## 2015-01-03 DIAGNOSIS — Z87891 Personal history of nicotine dependence: Secondary | ICD-10-CM | POA: Diagnosis not present

## 2015-01-03 DIAGNOSIS — Z85118 Personal history of other malignant neoplasm of bronchus and lung: Secondary | ICD-10-CM

## 2015-01-03 DIAGNOSIS — M4806 Spinal stenosis, lumbar region: Secondary | ICD-10-CM | POA: Diagnosis not present

## 2015-01-03 DIAGNOSIS — Z419 Encounter for procedure for purposes other than remedying health state, unspecified: Secondary | ICD-10-CM

## 2015-01-03 DIAGNOSIS — M4317 Spondylolisthesis, lumbosacral region: Secondary | ICD-10-CM | POA: Diagnosis not present

## 2015-01-03 DIAGNOSIS — M4316 Spondylolisthesis, lumbar region: Secondary | ICD-10-CM | POA: Diagnosis present

## 2015-01-03 DIAGNOSIS — Z888 Allergy status to other drugs, medicaments and biological substances status: Secondary | ICD-10-CM | POA: Diagnosis not present

## 2015-01-03 DIAGNOSIS — M48062 Spinal stenosis, lumbar region with neurogenic claudication: Secondary | ICD-10-CM | POA: Diagnosis present

## 2015-01-03 DIAGNOSIS — M4326 Fusion of spine, lumbar region: Secondary | ICD-10-CM | POA: Diagnosis not present

## 2015-01-03 SURGERY — POSTERIOR LUMBAR FUSION 1 LEVEL
Anesthesia: General

## 2015-01-03 MED ORDER — MELOXICAM 7.5 MG PO TABS
15.0000 mg | ORAL_TABLET | Freq: Every day | ORAL | Status: DC
  Administered 2015-01-04: 15 mg via ORAL
  Filled 2015-01-03 (×2): qty 2

## 2015-01-03 MED ORDER — THROMBIN 20000 UNITS EX SOLR
CUTANEOUS | Status: AC
  Filled 2015-01-03: qty 20000

## 2015-01-03 MED ORDER — BUPIVACAINE HCL 0.5 % IJ SOLN
INTRAMUSCULAR | Status: DC | PRN
  Administered 2015-01-03: 20 mL

## 2015-01-03 MED ORDER — KETOROLAC TROMETHAMINE 30 MG/ML IJ SOLN
INTRAMUSCULAR | Status: AC
  Filled 2015-01-03: qty 1

## 2015-01-03 MED ORDER — MIDAZOLAM HCL 5 MG/5ML IJ SOLN
INTRAMUSCULAR | Status: DC | PRN
Start: 2015-01-03 — End: 2015-01-03
  Administered 2015-01-03: 2 mg via INTRAVENOUS

## 2015-01-03 MED ORDER — ROCURONIUM BROMIDE 100 MG/10ML IV SOLN
INTRAVENOUS | Status: DC | PRN
  Administered 2015-01-03: 50 mg via INTRAVENOUS
  Administered 2015-01-03 (×2): 10 mg via INTRAVENOUS

## 2015-01-03 MED ORDER — PROPOFOL 10 MG/ML IV BOLUS
INTRAVENOUS | Status: AC
  Filled 2015-01-03: qty 20

## 2015-01-03 MED ORDER — LIDOCAINE HCL (CARDIAC) 20 MG/ML IV SOLN
INTRAVENOUS | Status: AC
  Filled 2015-01-03: qty 5

## 2015-01-03 MED ORDER — FLEET ENEMA 7-19 GM/118ML RE ENEM: 1.0000 | ENEMA | Freq: Once | RECTAL | Status: DC | PRN

## 2015-01-03 MED ORDER — OXYCODONE-ACETAMINOPHEN 5-325 MG PO TABS
1.0000 | ORAL_TABLET | ORAL | Status: DC | PRN
Start: 2015-01-03 — End: 2015-01-05
  Administered 2015-01-03 – 2015-01-05 (×6): 2 via ORAL
  Filled 2015-01-03 (×6): qty 2

## 2015-01-03 MED ORDER — ALBUTEROL SULFATE HFA 108 (90 BASE) MCG/ACT IN AERS
1.0000 | INHALATION_SPRAY | RESPIRATORY_TRACT | Status: DC | PRN
Start: 2015-01-03 — End: 2015-01-03

## 2015-01-03 MED ORDER — GABAPENTIN 300 MG PO CAPS
300.0000 mg | ORAL_CAPSULE | Freq: Three times a day (TID) | ORAL | Status: DC
  Administered 2015-01-03 – 2015-01-05 (×5): 300 mg via ORAL
  Filled 2015-01-03 (×5): qty 1

## 2015-01-03 MED ORDER — DEXAMETHASONE 4 MG PO TABS
4.0000 mg | ORAL_TABLET | Freq: Four times a day (QID) | ORAL | Status: DC
  Administered 2015-01-04 – 2015-01-05 (×4): 4 mg via ORAL
  Filled 2015-01-03 (×4): qty 1

## 2015-01-03 MED ORDER — HYDROCHLOROTHIAZIDE 25 MG PO TABS
25.0000 mg | ORAL_TABLET | Freq: Every day | ORAL | Status: DC
  Administered 2015-01-04 – 2015-01-05 (×2): 25 mg via ORAL
  Filled 2015-01-03 (×2): qty 1

## 2015-01-03 MED ORDER — SURGIFOAM 100 EX MISC
CUTANEOUS | Status: DC | PRN
  Administered 2015-01-03: 1 via TOPICAL

## 2015-01-03 MED ORDER — MEPERIDINE HCL 25 MG/ML IJ SOLN: 6.2500 mg | INTRAMUSCULAR | Status: DC | PRN

## 2015-01-03 MED ORDER — FENTANYL CITRATE (PF) 250 MCG/5ML IJ SOLN
INTRAMUSCULAR | Status: AC
  Filled 2015-01-03: qty 5

## 2015-01-03 MED ORDER — MIDAZOLAM HCL 2 MG/2ML IJ SOLN
INTRAMUSCULAR | Status: AC
  Filled 2015-01-03: qty 2

## 2015-01-03 MED ORDER — ONDANSETRON HCL 4 MG/2ML IJ SOLN: 4.0000 mg | Freq: Once | INTRAMUSCULAR | Status: DC | PRN

## 2015-01-03 MED ORDER — HYDROMORPHONE HCL 1 MG/ML IJ SOLN
INTRAMUSCULAR | Status: AC
  Administered 2015-01-03: 0.5 mg via INTRAVENOUS
  Filled 2015-01-03: qty 1

## 2015-01-03 MED ORDER — LACTATED RINGERS IV SOLN: INTRAVENOUS | Status: DC

## 2015-01-03 MED ORDER — GLYCOPYRROLATE 0.2 MG/ML IJ SOLN
INTRAMUSCULAR | Status: DC | PRN
  Administered 2015-01-03: 0.4 mg via INTRAVENOUS

## 2015-01-03 MED ORDER — BISACODYL 5 MG PO TBEC: 5.0000 mg | DELAYED_RELEASE_TABLET | Freq: Every day | ORAL | Status: DC | PRN

## 2015-01-03 MED ORDER — FENTANYL CITRATE (PF) 100 MCG/2ML IJ SOLN
INTRAMUSCULAR | Status: DC | PRN
  Administered 2015-01-03: 100 ug via INTRAVENOUS
  Administered 2015-01-03 (×3): 50 ug via INTRAVENOUS
  Administered 2015-01-03: 100 ug via INTRAVENOUS
  Administered 2015-01-03 (×6): 50 ug via INTRAVENOUS

## 2015-01-03 MED ORDER — PANTOPRAZOLE SODIUM 40 MG PO TBEC
40.0000 mg | DELAYED_RELEASE_TABLET | Freq: Every day | ORAL | Status: DC
  Administered 2015-01-04 – 2015-01-05 (×2): 40 mg via ORAL
  Filled 2015-01-03: qty 1

## 2015-01-03 MED ORDER — SUGAMMADEX SODIUM 200 MG/2ML IV SOLN
INTRAVENOUS | Status: AC
  Filled 2015-01-03: qty 2

## 2015-01-03 MED ORDER — ACETAMINOPHEN 325 MG PO TABS: 650.0000 mg | ORAL_TABLET | ORAL | Status: DC | PRN

## 2015-01-03 MED ORDER — DEXAMETHASONE SODIUM PHOSPHATE 4 MG/ML IJ SOLN
4.0000 mg | Freq: Four times a day (QID) | INTRAMUSCULAR | Status: DC
  Administered 2015-01-03 – 2015-01-04 (×4): 4 mg via INTRAVENOUS
  Filled 2015-01-03 (×4): qty 1

## 2015-01-03 MED ORDER — LACTATED RINGERS IV SOLN
INTRAVENOUS | Status: DC
  Administered 2015-01-03 – 2015-01-04 (×3): via INTRAVENOUS

## 2015-01-03 MED ORDER — THROMBIN 20000 UNITS EX SOLR
CUTANEOUS | Status: DC | PRN
  Administered 2015-01-03: 20000 [IU] via TOPICAL

## 2015-01-03 MED ORDER — CEFAZOLIN SODIUM-DEXTROSE 2-3 GM-% IV SOLR
2.0000 g | INTRAVENOUS | Status: AC
  Administered 2015-01-03 (×2): 2 g via INTRAVENOUS
  Filled 2015-01-03: qty 50

## 2015-01-03 MED ORDER — LUBIPROSTONE 24 MCG PO CAPS
24.0000 ug | ORAL_CAPSULE | Freq: Two times a day (BID) | ORAL | Status: DC
  Administered 2015-01-04 – 2015-01-05 (×3): 24 ug via ORAL
  Filled 2015-01-03 (×3): qty 1

## 2015-01-03 MED ORDER — ACETAMINOPHEN 650 MG RE SUPP: 650.0000 mg | RECTAL | Status: DC | PRN

## 2015-01-03 MED ORDER — POLYETHYLENE GLYCOL 3350 17 G PO PACK: 17.0000 g | PACK | Freq: Every day | ORAL | Status: DC | PRN

## 2015-01-03 MED ORDER — ALUM & MAG HYDROXIDE-SIMETH 200-200-20 MG/5ML PO SUSP: 30.0000 mL | Freq: Four times a day (QID) | ORAL | Status: DC | PRN

## 2015-01-03 MED ORDER — ONDANSETRON HCL 4 MG/2ML IJ SOLN
INTRAMUSCULAR | Status: DC | PRN
Start: 2015-01-03 — End: 2015-01-03
  Administered 2015-01-03: 4 mg via INTRAVENOUS

## 2015-01-03 MED ORDER — CALCIUM CARBONATE-VITAMIN D 500-200 MG-UNIT PO TABS
1.0000 | ORAL_TABLET | Freq: Every day | ORAL | Status: DC
  Administered 2015-01-04 – 2015-01-05 (×2): 1 via ORAL
  Filled 2015-01-03 (×2): qty 1

## 2015-01-03 MED ORDER — DULOXETINE HCL 60 MG PO CPEP
60.0000 mg | ORAL_CAPSULE | Freq: Two times a day (BID) | ORAL | Status: DC
  Administered 2015-01-03 – 2015-01-05 (×4): 60 mg via ORAL
  Filled 2015-01-03 (×4): qty 1

## 2015-01-03 MED ORDER — MENTHOL 3 MG MT LOZG: 1.0000 | LOZENGE | OROMUCOSAL | Status: DC | PRN

## 2015-01-03 MED ORDER — ONDANSETRON HCL 4 MG/2ML IJ SOLN: 4.0000 mg | INTRAMUSCULAR | Status: DC | PRN

## 2015-01-03 MED ORDER — HYDROCODONE-ACETAMINOPHEN 5-325 MG PO TABS
1.0000 | ORAL_TABLET | ORAL | Status: DC | PRN
  Administered 2015-01-04 (×2): 2 via ORAL
  Filled 2015-01-03 (×2): qty 2

## 2015-01-03 MED ORDER — CHLORHEXIDINE GLUCONATE 4 % EX LIQD: 60.0000 mL | Freq: Once | CUTANEOUS | Status: DC

## 2015-01-03 MED ORDER — METHOCARBAMOL 500 MG PO TABS
500.0000 mg | ORAL_TABLET | Freq: Four times a day (QID) | ORAL | Status: DC | PRN
Start: 2015-01-03 — End: 2015-01-05
  Administered 2015-01-04 – 2015-01-05 (×3): 500 mg via ORAL
  Filled 2015-01-03 (×3): qty 1

## 2015-01-03 MED ORDER — AMITRIPTYLINE HCL 25 MG PO TABS
150.0000 mg | ORAL_TABLET | Freq: Every day | ORAL | Status: DC
  Administered 2015-01-04: 150 mg via ORAL
  Filled 2015-01-03: qty 6

## 2015-01-03 MED ORDER — LACTATED RINGERS IV SOLN
INTRAVENOUS | Status: DC | PRN
  Administered 2015-01-03 (×3): via INTRAVENOUS

## 2015-01-03 MED ORDER — LIDOCAINE HCL (CARDIAC) 20 MG/ML IV SOLN
INTRAVENOUS | Status: DC | PRN
  Administered 2015-01-03: 100 mg via INTRAVENOUS

## 2015-01-03 MED ORDER — PHENOL 1.4 % MT LIQD: 1.0000 | OROMUCOSAL | Status: DC | PRN

## 2015-01-03 MED ORDER — DOCUSATE SODIUM 100 MG PO CAPS
100.0000 mg | ORAL_CAPSULE | Freq: Two times a day (BID) | ORAL | Status: DC
  Administered 2015-01-03 – 2015-01-05 (×4): 100 mg via ORAL
  Filled 2015-01-03 (×4): qty 1

## 2015-01-03 MED ORDER — BUPIVACAINE LIPOSOME 1.3 % IJ SUSP
INTRAMUSCULAR | Status: DC | PRN
  Administered 2015-01-03: 20 mL

## 2015-01-03 MED ORDER — CEFAZOLIN SODIUM 1-5 GM-% IV SOLN
1.0000 g | Freq: Three times a day (TID) | INTRAVENOUS | Status: AC
  Administered 2015-01-04 (×2): 1 g via INTRAVENOUS
  Filled 2015-01-03 (×2): qty 50

## 2015-01-03 MED ORDER — ZOLPIDEM TARTRATE 5 MG PO TABS
5.0000 mg | ORAL_TABLET | Freq: Every evening | ORAL | Status: DC | PRN
  Administered 2015-01-03: 5 mg via ORAL
  Filled 2015-01-03: qty 1

## 2015-01-03 MED ORDER — ACYCLOVIR 800 MG PO TABS
400.0000 mg | ORAL_TABLET | Freq: Two times a day (BID) | ORAL | Status: DC
  Administered 2015-01-03 – 2015-01-05 (×4): 400 mg via ORAL
  Filled 2015-01-03 (×4): qty 1

## 2015-01-03 MED ORDER — SODIUM CHLORIDE 0.9 % IV SOLN: 250.0000 mL | INTRAVENOUS | Status: DC

## 2015-01-03 MED ORDER — HYDROMORPHONE HCL 1 MG/ML IJ SOLN
0.2500 mg | INTRAMUSCULAR | Status: DC | PRN
  Administered 2015-01-03 (×4): 0.5 mg via INTRAVENOUS

## 2015-01-03 MED ORDER — SODIUM CHLORIDE 0.9 % IJ SOLN
3.0000 mL | Freq: Two times a day (BID) | INTRAMUSCULAR | Status: DC
  Administered 2015-01-03 – 2015-01-04 (×2): 3 mL via INTRAVENOUS

## 2015-01-03 MED ORDER — SODIUM CHLORIDE 0.9 % IJ SOLN: 3.0000 mL | INTRAMUSCULAR | Status: DC | PRN

## 2015-01-03 MED ORDER — KETOROLAC TROMETHAMINE 30 MG/ML IJ SOLN
30.0000 mg | Freq: Once | INTRAMUSCULAR | Status: AC
  Administered 2015-01-03: 30 mg via INTRAVENOUS

## 2015-01-03 MED ORDER — HYDROMORPHONE HCL 1 MG/ML IJ SOLN
INTRAMUSCULAR | Status: AC
  Filled 2015-01-03: qty 1

## 2015-01-03 MED ORDER — BUPIVACAINE LIPOSOME 1.3 % IJ SUSP
20.0000 mL | INTRAMUSCULAR | Status: DC
  Filled 2015-01-03: qty 20

## 2015-01-03 MED ORDER — NEOSTIGMINE METHYLSULFATE 10 MG/10ML IV SOLN
INTRAVENOUS | Status: DC | PRN
  Administered 2015-01-03: 3 mg via INTRAVENOUS

## 2015-01-03 MED ORDER — MORPHINE SULFATE (PF) 2 MG/ML IV SOLN
1.0000 mg | INTRAVENOUS | Status: DC | PRN
  Administered 2015-01-03 – 2015-01-04 (×5): 2 mg via INTRAVENOUS
  Filled 2015-01-03 (×5): qty 1

## 2015-01-03 MED ORDER — METHOCARBAMOL 1000 MG/10ML IJ SOLN
500.0000 mg | Freq: Four times a day (QID) | INTRAMUSCULAR | Status: DC | PRN
  Filled 2015-01-03: qty 5

## 2015-01-03 MED ORDER — GLYCOPYRROLATE 0.2 MG/ML IJ SOLN
INTRAMUSCULAR | Status: AC
  Filled 2015-01-03: qty 1

## 2015-01-03 MED ORDER — 0.9 % SODIUM CHLORIDE (POUR BTL) OPTIME
TOPICAL | Status: DC | PRN
  Administered 2015-01-03: 1000 mL

## 2015-01-03 MED ORDER — PROPOFOL 10 MG/ML IV BOLUS
INTRAVENOUS | Status: DC | PRN
  Administered 2015-01-03: 150 mg via INTRAVENOUS

## 2015-01-03 MED ORDER — CALCIUM CITRATE-VITAMIN D 250-200 MG-UNIT PO TABS: 1.0000 | ORAL_TABLET | Freq: Every day | ORAL | Status: DC

## 2015-01-03 MED ORDER — ATORVASTATIN CALCIUM 40 MG PO TABS
40.0000 mg | ORAL_TABLET | Freq: Every day | ORAL | Status: DC
  Administered 2015-01-03 – 2015-01-04 (×2): 40 mg via ORAL
  Filled 2015-01-03 (×2): qty 1

## 2015-01-03 SURGICAL SUPPLY — 70 items
ADH SKN CLS APL DERMABOND .7 (GAUZE/BANDAGES/DRESSINGS)
BLADE SURG ROTATE 9660 (MISCELLANEOUS) IMPLANT
BONE MATRIX VIVIGEN 1CC (Bone Implant) ×2 IMPLANT
BUR MATCHSTICK NEURO 3.0 LAGG (BURR) ×2 IMPLANT
BUR RND FLUTED 2.5 (BURR) IMPLANT
BUR SABER RD CUTTING 3.0 (BURR) IMPLANT
COVER MAYO STAND STRL (DRAPES) ×4 IMPLANT
COVER SURGICAL LIGHT HANDLE (MISCELLANEOUS) ×2 IMPLANT
DERMABOND ADVANCED (GAUZE/BANDAGES/DRESSINGS)
DERMABOND ADVANCED .7 DNX12 (GAUZE/BANDAGES/DRESSINGS) ×1 IMPLANT
DRAPE C-ARM 42X72 X-RAY (DRAPES) ×2 IMPLANT
DRAPE C-ARMOR (DRAPES) ×2 IMPLANT
DRAPE INCISE IOBAN 66X45 STRL (DRAPES) ×1 IMPLANT
DRAPE MICROSCOPE LEICA (MISCELLANEOUS) ×1 IMPLANT
DRAPE POUCH INSTRU U-SHP 10X18 (DRAPES) ×2 IMPLANT
DRAPE SURG 17X23 STRL (DRAPES) ×8 IMPLANT
DRAPE TABLE COVER HEAVY DUTY (DRAPES) ×2 IMPLANT
DRSG MEPILEX BORDER 4X4 (GAUZE/BANDAGES/DRESSINGS) IMPLANT
DRSG MEPILEX BORDER 4X8 (GAUZE/BANDAGES/DRESSINGS) ×1 IMPLANT
DURAPREP 26ML APPLICATOR (WOUND CARE) ×2 IMPLANT
ELECT BLADE 6.5 EXT (BLADE) IMPLANT
ELECT CAUTERY BLADE 6.4 (BLADE) ×2 IMPLANT
ELECT REM PT RETURN 9FT ADLT (ELECTROSURGICAL) ×2
ELECTRODE REM PT RTRN 9FT ADLT (ELECTROSURGICAL) ×1 IMPLANT
EVACUATOR 1/8 PVC DRAIN (DRAIN) IMPLANT
GAUZE SPONGE 4X4 12PLY STRL (GAUZE/BANDAGES/DRESSINGS) ×2 IMPLANT
GLOVE BIOGEL PI IND STRL 8 (GLOVE) ×1 IMPLANT
GLOVE BIOGEL PI INDICATOR 8 (GLOVE) ×2
GLOVE ECLIPSE 9.0 STRL (GLOVE) ×3 IMPLANT
GLOVE ORTHO TXT STRL SZ7.5 (GLOVE) ×3 IMPLANT
GLOVE SURG 8.5 LATEX PF (GLOVE) ×3 IMPLANT
GOWN STRL REUS W/ TWL LRG LVL3 (GOWN DISPOSABLE) ×1 IMPLANT
GOWN STRL REUS W/TWL 2XL LVL3 (GOWN DISPOSABLE) ×5 IMPLANT
GOWN STRL REUS W/TWL LRG LVL3 (GOWN DISPOSABLE) ×4
KIT BASIN OR (CUSTOM PROCEDURE TRAY) ×2 IMPLANT
KIT POSITION SURG JACKSON T1 (MISCELLANEOUS) ×2 IMPLANT
KIT ROOM TURNOVER OR (KITS) ×2 IMPLANT
NDL ASP BONE MRW 11GX15 (NEEDLE) IMPLANT
NDL SPNL 18GX3.5 QUINCKE PK (NEEDLE) ×1 IMPLANT
NEEDLE 22X1 1/2 (OR ONLY) (NEEDLE) ×2 IMPLANT
NEEDLE ASP BONE MRW 11GX15 (NEEDLE) IMPLANT
NEEDLE BONE MARROW 8GAX6 (NEEDLE) IMPLANT
NEEDLE SPNL 18GX3.5 QUINCKE PK (NEEDLE) ×2 IMPLANT
NS IRRIG 1000ML POUR BTL (IV SOLUTION) ×2 IMPLANT
PACK LAMINECTOMY ORTHO (CUSTOM PROCEDURE TRAY) ×2 IMPLANT
PAD ARMBOARD 7.5X6 YLW CONV (MISCELLANEOUS) ×4 IMPLANT
PATTIES SURGICAL .75X.75 (GAUZE/BANDAGES/DRESSINGS) ×2 IMPLANT
PATTIES SURGICAL 1X1 (DISPOSABLE) ×2 IMPLANT
ROD PRE BENT EXP 40MM (Rod) ×2 IMPLANT
SCREW CORTICAL VIPER 7X40MM (Screw) ×4 IMPLANT
SCREW SET SINGLE INNER (Screw) ×4 IMPLANT
SPACER TPAL 10X8X28MM (Spacer) ×1 IMPLANT
SPONGE LAP 18X18 X RAY DECT (DISPOSABLE) ×1 IMPLANT
SURGIFLO W/THROMBIN 8M KIT (HEMOSTASIS) ×1 IMPLANT
SUT VIC AB 0 CT1 27 (SUTURE) ×2
SUT VIC AB 0 CT1 27XBRD ANBCTR (SUTURE) ×1 IMPLANT
SUT VIC AB 1 CTX 36 (SUTURE) ×4
SUT VIC AB 1 CTX36XBRD ANBCTR (SUTURE) ×2 IMPLANT
SUT VIC AB 2-0 CT1 27 (SUTURE) ×2
SUT VIC AB 2-0 CT1 TAPERPNT 27 (SUTURE) ×1 IMPLANT
SUT VIC AB 3-0 X1 27 (SUTURE) ×2 IMPLANT
SUT VICRYL 0 CT 1 36IN (SUTURE) ×4 IMPLANT
SYR 20CC LL (SYRINGE) ×2 IMPLANT
SYR CONTROL 10ML LL (SYRINGE) ×4 IMPLANT
TOWEL OR 17X24 6PK STRL BLUE (TOWEL DISPOSABLE) ×2 IMPLANT
TOWEL OR 17X26 10 PK STRL BLUE (TOWEL DISPOSABLE) ×2 IMPLANT
TRAY FOLEY CATH 16FRSI W/METER (SET/KITS/TRAYS/PACK) ×2 IMPLANT
TUBE CONNECTING 12X1/4 (SUCTIONS) ×1 IMPLANT
WATER STERILE IRR 1000ML POUR (IV SOLUTION) ×2 IMPLANT
YANKAUER SUCT BULB TIP NO VENT (SUCTIONS) ×2 IMPLANT

## 2015-01-03 NOTE — Progress Notes (Signed)
Received patient from PACU. Patient alert and oriented. Dressing to back  Clean dry and intact. Patient oriented to room and unit. Offered fluids by mouth tolerating well.

## 2015-01-03 NOTE — Anesthesia Postprocedure Evaluation (Signed)
Anesthesia Post Note  Patient: [name]  Procedure(s) Performed: Procedure(s) (LRB): TRANSFORAMINAL LUMBAR INTERBODY FUSION L3-4 WITH PEDICLE SCREWS, RODS, CAGE, LOCAL BONE GRAFT, VIVIGEN, ALLOGRAFT CANCELLOUS CHIPS (N/A)  Patient location during evaluation: PACU Anesthesia Type: General Level of consciousness: awake Pain management: pain level controlled Vital Signs Assessment: post-procedure vital signs reviewed and stable Respiratory status: spontaneous breathing Anesthetic complications: no    Last Vitals:  Filed Vitals:   01/03/15 1114 01/03/15 1750  BP: 155/48 144/88  Pulse: 77   Temp: 36.6 C 36.5 C  Resp: 16     Last Pain:  Filed Vitals:   01/03/15 1753  PainSc: Asleep                 EDWARDS,Khalise Billard

## 2015-01-03 NOTE — Op Note (Signed)
01/03/2015  5:07 PM  PATIENT:  [name]  54 y.o. female  MRN: [id number]  OPERATIVE REPORT  PRE-OPERATIVE DIAGNOSIS:  L3-4 Spondylolisthesis with severe stenosis  POST-OPERATIVE DIAGNOSIS:  L3-4 Spondylolisthesis with severe stenosis  PROCEDURE:  Procedure(s): TRANSFORAMINAL LUMBAR INTERBODY FUSION L3-4 WITH PEDICLE SCREWS, RODS, CAGE, LOCAL BONE GRAFT, VIVIGEN, ALLOGRAFT CANCELLOUS CHIPS    SURGEON:  Jessy Oto, MD     ASSISTANT:  Benjiman Core, PA-C  (Present throughout the entire procedure and necessary for completion of procedure in a timely manner)     ANESTHESIA:  General,supplemented with local anesthetic marcaine 0.5% 1:1 exparel 1.3% total 40cc, Dr. Oletta Lamas Dr. Orene Desanctis.    COMPLICATIONS:  None.   EBL: 300cc    COMPONENTS:   Implant Name Type Inv. Item Serial No. Manufacturer Lot No. LRB No. Used  BONE MATRIX VIVIGEN 1CC - [id number] Bone Implant BONE MATRIX VIVIGEN 1CC [id number] [facility] [place] [facility]  N/A 1  BONE MATRIX VIVIGEN 1CC - [id number] Bone Implant BONE MATRIX VIVIGEN 1CC [id number] [employer] [place] [facility]  N/A 1  SPACER T-PAL 8X28MM - [id number] Spacer SPACER T-PAL 8X28MM  [employer]  N/A 1  SCREW CORTICAL VIPER 7X40MM - [id number] Screw SCREW CORTICAL VIPER 7X40MM  DEPUY SPINE  N/A 4  SCREW SET SINGLE INNER - [id number] Screw SCREW SET SINGLE INNER  DEPUY SPINE  N/A 4  ROD PRE BENT EXP 40MM - [id number] Rod ROD PRE BENT EXP 40MM   DEPUY SPINE   N/A 2     PROCEDURE: The patient was met in the holding area, and the appropriate lumbar levels right  L3-4 identified and marked with an "X" and my initials. I had discussion with the patient in the preop holding area regarding a change of consent form.The fusion level was reidentified as  L3-4. Patient understands the rationale to perform TLIF at L3-4 level to decompress the bilateral L3-4 lateral recess and foramenal stenosis. The patient was then transported to OR and was placed under  general anestheticwithout difficulty. The patient received appropriate preoperative antibiotic prophylaxis ancef 2 grams IV.  Nursing staff inserted a Foley catheter under sterile conditions. The patient was then turned to a prone position using the [place] spine frame. PAS. all pressure points well padded the arms at the side to 90 90. Standard prep with DuraPrep solution draped in the usual manner from the lower dorsal spine the mid sacral segment. Iodine Vi-Drape was used and the skin was marked. Time-out procedure was called and correct. Skin in the midline between L3 and L5 was then infiltrated with local anesthesia, marcaine 1/2% 1:1 exparel 1.3% total 20 cc used. Incision was then made  extending from L2-3 to L3-4  through the skin and subcutaneous layers down to the patient's lumbodorsal fascia and spinous processes. The incision then carried sharply excising the supraspinous ligament and then continuing the lateral aspect of the spinous processes of L2, L3 and L4. Cobb elevator used to carefully elevate the paralumbar muscles off of the posterior elements using electrocautery carefully drilled bleeding and perform dissection of the muscle tissues of the preserving the facet capsule at the L2-3. Continuing the exposure out laterally to expose the lateral margin of the facet joint line at L2-3 and L3-4. Incision was carried in the midline down to the L4 level area bleeders controlled using electrocautery monopolar electrocautery.   C-arm fluoroscopy was then brought into the field and using C-arm fluoroscopy then a hole made into the medial aspect of the  left pedicle of L3 using a high speed burr observed in the pedicle using C arm at the 5 oclock position on the left L3 pedicle nerve probe initial entry was determined on fluoroscopy to be good position alignment so that a 4.76m tap was passed to 30 mm within the left L3 pedicle to a depth of nearly 40 mm observed on C-arm fluoroscopy to be beyond the  midpoint of the lumbar vertebra and then position alignment within the left L3 pedicle this was then removed and the pedicle channel probed demonstrating patency no sign of rupture the cortex of the pedicle. Tapping with a 4 mm screw tap then 5 mm tap then a 6 mm and a 7.071mtap, a 7.0 mm x 40 mm screw was placed on the left side pedicle at the L3 level. C-arm fluoroscopy was then brought into the field and using C-arm fluoroscopy then a hole made into the posterior medial aspect of the pedicle of right L3 observed in the pedicle using ball tipped nerve hook and hockey stick nerve probe initial entry was determined on fluoroscopy to be good position alignment so that 4.26m4map was then used to tap the right L3 pedicle to a depth of nearly 40 mm observed on C-arm fluoroscopy to be at the midpoint of the lumbar vertebra and then position alignment within the right L3 pedicle this was then removed and the pedicle channel probed demonstrating patency no sign of rupture the cortex of the pedicle. Tapping with a 5 mm screw tap then tapping with a 6.0 mm tap and a 7.26mm4mp, a 7.0 mm x 40 mm screw was saved for later placement after decompression and TLIF. C-arm fluoroscopy was then brought into the field and using C-arm fluoroscopy then a hole made into the posterior and medial aspect of the left pedicle of L4 observed in the pedicle using ball tipped nerve hook and hockey stick nerve probe initial entry was determined on fluoroscopy to be good position alignment so that a 4.0 mm tap was then used to tap the left L4 pedicle to a depth of nearly 40 mm observed on C-arm fluoroscopy to be beyond the posterior one third of the lumbar vertebra and good position alignment within the left L4 pedicle this was then removed and the pedicle channel probed demonstrating patency no sign of rupture the cortex of the pedicle. Tapping with a 4.0 mm screw tap then a 5.0 mm tap then tapping with a 6.0 mm tap and then a 7.26mm 46m, a 7.26mm x57m0 mm screw was placed on the left side at the L4 level. The pedicle channel of L4 on the left probed demonstrating patency no sign of rupture the cortex of the pedicle.C-arm fluoroscopy was then brought into the field and using C-arm fluoroscopy then a hole made into the posterior and medial aspect of the right pedicle of L4 observed in the pedicle using ball tipped nerve hook and hockey stick nerve probe initial entry was determined on fluoroscopy to be good position alignment so that a 4.26mm ta59mas then used to tap the right L4 pedicle to a depth of nearly 40 mm observed on C-arm fluoroscopy to be at the posterior one half of the lumbar vertebra and good position alignment within the right L4 pedicle this was then removed and the pedicle channel probed demonstrating patency no sign of rupture the cortex of the pedicle. Tapping with a 5 mm screw tap then a 6.26mm tap81md then a  7.820m tap, a 7.057mx 40 mm screw was saved to be placed on the right side at the L5 level after completion of TLIF portion of the case. The pedicle channel of L4 on the right probed demonstrating patency no sign of rupture the cortex of the pedicle. Viper screw for fixation of this level was measured as 6.0 mm x 40 mm screw reserved for insertion after decompression and TLIF.  Flow Seal was used for hemostasis of the bilateral L4 and L3 pedicle screw holes.  Spinous processes of  L4 and inferior 60% of L3 were then resected down to the base the lamina at each segment.  Leksell rongeur used to resect inferior aspect of the lamina on the left side at the L3 level and partially on the right side at L3 The left medial 40% of the facets of L3-4 were resected in order to decompress the left and right side of the lumbar thecal sac at L3-4 and decompress the bilateral  L3 and L4 neuroforamen. Osteotomes and 20m41mnd 3mm25mrrisons were used for this portion of the decompression. Similarly the right side decompression was carried out but  Near  complete facetectomy was perform on the right at L3-4 to provide for exposure of the right side L3-4 neuroforamen for ease of placement of TLIF (transforaminal lumbar interbody fusion) at the L3 level inferior portions of the lamina and pars were also resected first beginning with the Leksell rongeur and osteotomes and then resecting using 2 and 3 mm Kerrison. Continued laminectomy was carried out resecting the central portions of the lamina of L3 and upper L4 performing foraminotomies on the right side at the L3 and L4levels. The inferior articular process  L3 was resected on the right side.  A large amount of hypertrophic ligmentum flavum was found impressing on the right lateral recesses at L3-4 and narrowing the respective L4 and L3 neuroforamen.  Loupe magnification and headlight were used during this portion procedure.  Attention then turned to placement of the transforaminal lumbar interbody fusion cages.  Bleeding controlled using bipolar electrocautery thrombin soaked gel cottonoids. Then turned to the right L3-4 level the exposure the posterior lateral aspect this was carried out using a Penfield 4 bipolar electrocautery to control small bleeders present. Derricho retractor used to retract the thecal sac and L3 nerve root a 15 blade scalpel was used to incise posterior lateral aspect of the right L3-4 disc the disc space at this level showed a rather severe narrowing posteriorly was more open anteriorly so that an osteotome again was used to resect a small portion the posterior inferior lip of the vertebral body at L3  in order to gain ease of access into the L3-4 disc space. The space was debrided of degenerative disc material using pituitary along root the entire disc space was then debrided of degenerative disc material using pituitary rongeurs curettage down to bleeding bone endplates. 7mm 41m 8 mm shavers were used to debride the disc space and pituitary ronguers used to remove the loosened debris.  This space was then carefully assess using spacers  a 9.0mm t98ml cage provided the best fit, the Synthes TPAL 8mmx 240m ca5mas chosen so that the permanent 8.0 mm x 28mm cag34mordotic TPAL cage packed with local bone graft and vivigen,additional graft was placed into the intervertebral disc space. The posterior intervertebral disc space was then packed with autogenous local bone graft that been harvested from the central laminectomy and vivigen allograft. Bleeding controlled using  bipolar electrocautery.  Observed on C-arm fluoroscopy to be in good position alignment. The cage at L3-4 was placed anteriorly as best as possible the correct patient's lordosis. With this then the transforaminal lumbar interbody fusion portion of the case was completed bleeders were controlled using bipolar electrocautery thrombin-soaked Gelfoam were appropriate.Decortication of the facet joints carried out bilateral L3-4. These were packed with cancellous local bone graft.  The 2 viper corticofixation screws on the right were each placed and then each fastener carefully aligned  to allow for placement of rods. The right side first quarter inch titanium rod was then carefully contoured using the french benders and a precontoured 40 mm rod. This was then placed into the pedicle screws on the right extending from L3-L4 each of the caps were carefully placed loosely tightened. Attention turned to the left side were similarly and then screws were carefully adjusted to allow for a better pattern screws to allow for placement of fixation of the rod a quarter inch 40 mm precontoured titanium rod was then carefully contoured. This was able to be inserted into the left pedicle screw fasteners, Caps onto the L3 fasteners were tightened to 80 foot lbs. Across the right side  L3-4 screw fasteners compression was obtained on the right side between L3 and L4 compressing between the fasteners and tightening the screw caps 85 pounds. Similarly this  was done on the left side at L3-4 obtaining compression and tightened 85 pounds. Irrigation was carried out with copious amounts of saline solution this was done throughout the case. Cell Saver was used during the case. Hockey stick neuroprobe was used to probe the neuroforamen bilateral L3and L4 these were determined to be well decompressed. Permanent C-arm images were obtained in AP and lateral plane and oblique planes. Remaining local bone graft was then applied along both lateral posterior lateral region extending from L3 to L4 facet beds.Gelfoam was then removed spinal canal. The lumbodorsal musculature carefully exam debrided of any devitalized tissue following removal of self retaining retractors were the bleeders were controlled using electrocautery and the area dorsal lumbar muscle were then approximated in the midline with interrupted #1 Vicryl sutures loose the dorsal fascia was reattached to the spinous process of L2-3  superiorly and L5  inferiorly this was done with #1 Vicryl sutures. Subcutaneous layers then approximated using interrupted 0 Vicryl sutures and 2-0 Vicryl sutures. Skin was closed with a running subcutaneous stitch of 4-0 Vicryl Dermabond was applied then MedPlex bandage. All instrument and sponge counts were correct. The patient was then returned to a supine position on her bed reactivated extubated and returned to the recovery room in satisfactory condition.   Benjiman Core, PA-C perform the duties of assistant surgeon during this case. He was present from the beginning of the case to the end of the case assisting in transfer the patient from her stretcher to the OR table and back to the stretcher at the end of the case. Assisted in careful retraction and suction of the laminectomy site delicate neural structures operating under the operating room microscope. He performed closure of the incision from the fascia to the skin applying the dressing.         Ethelle Ola E  01/03/2015,  5:07 PM

## 2015-01-03 NOTE — Brief Op Note (Signed)
01/03/2015  5:06 PM  PATIENT:  [name]  54 y.o. female  PRE-OPERATIVE DIAGNOSIS:  L3-4 Spondylolisthesis with severe stenosis  POST-OPERATIVE DIAGNOSIS:  * No post-op diagnosis entered *  PROCEDURE:  Procedure(s): TRANSFORAMINAL LUMBAR INTERBODY FUSION L3-4 WITH PEDICLE SCREWS, RODS, CAGE, LOCAL BONE GRAFT, VIVIGEN, ALLOGRAFT CANCELLOUS CHIPS (N/A)  SURGEON:  Surgeon(s) and Role:    * Jessy Oto, MD - Primary  PHYSICIAN ASSISTANT: Benjiman Core, PA-C  ANESTHESIA:   local and regional  EBL:  Total I/O In: 2000 [I.V.:2000] Out: 175 [Urine:175]  BLOOD ADMINISTERED:none  DRAINS: Urinary Catheter (Foley)   LOCAL MEDICATIONS USED:  MARCAINE 0.5% 1:1 EXPAREL 1.3% Amount: 40 ml  SPECIMEN:  No Specimen  DISPOSITION OF SPECIMEN:  N/A  COUNTS:  YES  TOURNIQUET:  * No tourniquets in log *  DICTATION: .Dragon Dictation  PLAN OF CARE: Admit to inpatient   PATIENT DISPOSITION:  PACU - hemodynamically stable.   Delay start of Pharmacological VTE agent (>24hrs) due to surgical blood loss or risk of bleeding: yes

## 2015-01-03 NOTE — Interval H&P Note (Signed)
History and Physical Interval Note:  01/03/2015 12:31 PM  [name]  has presented today for surgery, with the diagnosis of L3-4 Spondylolisthesis with severe stenosis  The various methods of treatment have been discussed with the patient and family. After consideration of risks, benefits and other options for treatment, the patient has consented to  Procedure(s): TRANSFORAMINAL LUMBAR INTERBODY FUSION L3-4 WITH PEDICLE SCREWS, RODS, CAGE, LOCAL BONE GRAFT, VIVIGEN, ALLOGRAFT CANCELLOUS CHIPS (N/A) as a surgical intervention .  The patient's history has been reviewed, patient examined, no change in status, stable for surgery.  I have reviewed the patient's chart and labs.  Questions were answered to the patient's satisfaction.     NITKA,JAMES E

## 2015-01-03 NOTE — Anesthesia Preprocedure Evaluation (Signed)
Anesthesia Evaluation  Patient identified by MRN, date of birth, ID band Patient awake    Reviewed: Allergy & Precautions, NPO status , Patient's Chart, lab work & pertinent test results  Airway Mallampati: I  TM Distance: >3 FB Neck ROM: Full    Dental   Pulmonary sleep apnea , former smoker,    Pulmonary exam normal        Cardiovascular hypertension, Pt. on medications Normal cardiovascular exam     Neuro/Psych Depression    GI/Hepatic GERD  Medicated and Controlled,  Endo/Other    Renal/GU      Musculoskeletal   Abdominal   Peds  Hematology   Anesthesia Other Findings   Reproductive/Obstetrics                             Anesthesia Physical Anesthesia Plan  ASA: II  Anesthesia Plan: General   Post-op Pain Management:    Induction: Intravenous  Airway Management Planned: Oral ETT  Additional Equipment:   Intra-op Plan:   Post-operative Plan: Extubation in OR  Informed Consent: I have reviewed the patients History and Physical, chart, labs and discussed the procedure including the risks, benefits and alternatives for the proposed anesthesia with the patient or authorized representative who has indicated his/her understanding and acceptance.     Plan Discussed with: CRNA and Surgeon  Anesthesia Plan Comments:         Anesthesia Quick Evaluation

## 2015-01-03 NOTE — Progress Notes (Signed)
Drowsy in PACU, slow to reactivate post anesthesia. Moving legs and arms spontaneously all motor appears intact. Start PT tomorrow, d/c foley in AM. Expect may be ready for discharge Monday AM.

## 2015-01-03 NOTE — Anesthesia Procedure Notes (Signed)
Procedure Name: Intubation Date/Time: 01/03/2015 1:12 PM Performed by: Manus Gunning, Artemis Loyal J Pre-anesthesia Checklist: Patient identified, Emergency Drugs available, Suction available, Patient being monitored and Timeout performed Patient Re-evaluated:Patient Re-evaluated prior to inductionOxygen Delivery Method: Circle system utilized Preoxygenation: Pre-oxygenation with 100% oxygen Intubation Type: IV induction Ventilation: Mask ventilation without difficulty Laryngoscope Size: Mac and 3 Grade View: Grade II Tube type: Oral Tube size: 7.0 mm Number of attempts: 1 Placement Confirmation: ETT inserted through vocal cords under direct vision,  positive ETCO2 and breath sounds checked- equal and bilateral Secured at: 22 cm Tube secured with: Tape Dental Injury: Teeth and Oropharynx as per pre-operative assessment

## 2015-01-03 NOTE — Transfer of Care (Signed)
Immediate Anesthesia Transfer of Care Note  Patient: [name]  Procedure(s) Performed: Procedure(s): TRANSFORAMINAL LUMBAR INTERBODY FUSION L3-4 WITH PEDICLE SCREWS, RODS, CAGE, LOCAL BONE GRAFT, VIVIGEN, ALLOGRAFT CANCELLOUS CHIPS (N/A)  Patient Location: PACU  Anesthesia Type:General  Level of Consciousness: awake  Airway & Oxygen Therapy: Patient Spontanous Breathing and Patient connected to nasal cannula oxygen  Post-op Assessment: Report given to RN and Post -op Vital signs reviewed and stable  Post vital signs: Reviewed and stable  Last Vitals:  Filed Vitals:   01/03/15 1114  BP: 155/48  Pulse: 77  Temp: 36.6 C  Resp: 16    Complications: No apparent anesthesia complications

## 2015-01-04 ENCOUNTER — Encounter (HOSPITAL_COMMUNITY): Payer: Self-pay | Admitting: *Deleted

## 2015-01-04 LAB — BASIC METABOLIC PANEL
ANION GAP: 6 (ref 5–15)
BUN: 6 mg/dL (ref 6–20)
CHLORIDE: 100 mmol/L — AB (ref 101–111)
CO2: 31 mmol/L (ref 22–32)
Calcium: 8.3 mg/dL — ABNORMAL LOW (ref 8.9–10.3)
Creatinine, Ser: 0.87 mg/dL (ref 0.44–1.00)
GFR calc Af Amer: >60 mL/min (ref >60)
GLUCOSE: 174 mg/dL — AB (ref 65–99)
POTASSIUM: 3.9 mmol/L (ref 3.5–5.1)
Sodium: 137 mmol/L (ref 135–145)

## 2015-01-04 LAB — CBC
HEMATOCRIT: 32.3 % — AB (ref 36.0–46.0)
Hemoglobin: 10.8 g/dL — ABNORMAL LOW (ref 12.0–15.0)
MCH: 31.8 pg (ref 26.0–34.0)
MCHC: 33.4 g/dL (ref 30.0–36.0)
MCV: 95 fL (ref 78.0–100.0)
PLATELETS: 232 10*3/uL (ref 150–400)
RBC: 3.4 MIL/uL — AB (ref 3.87–5.11)
RDW: 12.7 % (ref 11.5–15.5)
WBC: 6.9 10*3/uL (ref 4.0–10.5)

## 2015-01-04 MED ORDER — AMITRIPTYLINE HCL 25 MG PO TABS: 150.0000 mg | ORAL_TABLET | Freq: Every evening | ORAL | Status: DC

## 2015-01-04 NOTE — Progress Notes (Signed)
OT Cancellation Note  Patient Details Name: [name] MRN: [id number] DOB: 04/01/1960   Cancelled Treatment:    Reason Eval/Treat Not Completed:  (Pt's back brace had not arrived when OT checked on pt.)    Benito Mccreedy OTR/L [id number] 01/04/2015, 12:38 PM

## 2015-01-04 NOTE — Evaluation (Addendum)
Occupational Therapy Evaluation Patient Details Name: [name] MRN: [id number] DOB: Jun 06, 1960 Today's Date: 01/04/2015    History of Present Illness 54 y.o. female with left greater than right LE radiculopathy. Failed conservative treatment. MRI showed severe L3-4 spinal stenosis. Post L3-4 PLIF.   Clinical Impression   Pt s/p above. Pt independent with ADLs, PTA. Feel pt will benefit from acute OT to increase independence prior to d/c. Do not feel pt will need follow up OT upon d/c. Will follow acutely.    Follow Up Recommendations  No OT follow up;Supervision - Intermittent    Equipment Recommendations  3 in 1 bedside comode;Other (comment) (AE)    Recommendations for Other Services       Precautions / Restrictions Precautions Precautions: Back Precaution Booklet Issued: No Precaution Comments: educated on back precautions Required Braces or Orthoses: Spinal Brace Spinal Brace: Lumbar corset (already on in session; doffed in sitting) Restrictions Weight Bearing Restrictions: No      Mobility Bed Mobility Overal bed mobility: Needs Assistance Bed Mobility: Rolling;Sit to Sidelying Rolling: Supervision     Sit to sidelying: Supervision General bed mobility comments: cues for technique.  Transfers Overall transfer level: Needs assistance Transfers: Sit to/from Stand Sit to Stand: Supervision         General transfer comment: RW in front    Balance Used RW for ambulation. No LOB in session.                 ADL Overall ADL's : Needs assistance/impaired                 Upper Body Dressing : Set up;Supervision/safety;Sitting   Lower Body Dressing: Minimal assistance;With adaptive equipment;Sit to/from stand   Toilet Transfer: Supervision/safety;Ambulation;Regular Toilet;RW           Functional mobility during ADLs: Supervision/safety;Rolling walker General ADL Comments: Discussed incorporating precautions into functional  activities. Educated about back brace. Educated on AE and LB dressing technique. Educated on what pt can use for toilet aide if needed. Discussed 3 in 1.     Vision     Perception     Praxis      Pertinent Vitals/Pain Pain Assessment: 0-10 Pain Score: 7  Pain Location: back Pain Intervention(s): Repositioned     Hand Dominance Right   Extremity/Trunk Assessment Upper Extremity Assessment Upper Extremity Assessment: Overall WFL for tasks assessed   Lower Extremity Assessment Lower Extremity Assessment: Defer to PT evaluation       Communication Communication Communication: No difficulties   Cognition Arousal/Alertness: Awake/alert Behavior During Therapy: WFL for tasks assessed/performed Overall Cognitive Status: Within Functional Limits for tasks assessed                     General Comments       Exercises       Shoulder Instructions      Home Living Family/patient expects to be discharged to:: Private residence Living Arrangements: Other relatives (staying at sister's house) Available Help at Discharge: Family Type of Home: House Home Access: Level entry     Home Layout: Two level (bedroom and bathroom on 2nd floor) Alternate Level Stairs-Number of Steps: 12-13 Alternate Level Stairs-Rails: Left Bathroom Shower/Tub: Teacher, early years/pre:  (elevated toilet with sink close)     Home Equipment: None          Prior Functioning/Environment Level of Independence: Independent             OT Diagnosis:  Acute pain   OT Problem List: Decreased range of motion;Pain;Decreased knowledge of precautions;Decreased knowledge of use of DME or AE;Decreased activity tolerance   OT Treatment/Interventions: Self-care/ADL training;DME and/or AE instruction;Therapeutic activities;Patient/family education;Balance training    OT Goals(Current goals can be found in the care plan section) Acute Rehab OT Goals Patient Stated Goal: to walk OT  Goal Formulation: With patient Time For Goal Achievement: 01/11/15 Potential to Achieve Goals: Good ADL Goals Pt Will Perform Lower Body Dressing: with set-up;with supervision;with adaptive equipment;sit to/from stand Pt Will Transfer to Toilet: with modified independence;ambulating;grab bars (elevated toilet ) Pt Will Perform Tub/Shower Transfer: Tub transfer;with supervision;ambulating;with set-up;3 in 1;rolling walker Additional ADL Goal #1: Pt will independently verbalize 3/3 back precautions and maintain during session.  OT Frequency: Min 2X/week   Barriers to D/C:            Co-evaluation              End of Session Equipment Utilized During Treatment: Rolling walker;Back brace  Activity Tolerance: Patient tolerated treatment well Patient left: in bed;with call bell/phone within reach;with bed alarm set;with family/visitor present   Time: 7615-1834 OT Time Calculation (min): 16 min Charges:  OT General Charges $OT Visit: 1 Procedure OT Evaluation $Initial OT Evaluation Tier I: 1 Procedure G-CodesBenito Mccreedy OTR/L [id number] 01/04/2015, 4:00 PM

## 2015-01-04 NOTE — Progress Notes (Signed)
Subjective: 1 Day Post-Op Procedure(s) (LRB): TRANSFORAMINAL LUMBAR INTERBODY FUSION L3-4 WITH PEDICLE SCREWS, RODS, CAGE, LOCAL BONE GRAFT, VIVIGEN, ALLOGRAFT CANCELLOUS CHIPS (N/A) Patient reports pain as moderate.    Objective: Vital signs in last 24 hours: Temp:  [97.3 F (36.3 C)-98.8 F (37.1 C)] 98.8 F (37.1 C) (12/17 0939) Pulse Rate:  [65-104] 65 (12/17 0939) Resp:  [14-26] 18 (12/17 0939) BP: (133-159)/(49-88) 144/63 mmHg (12/17 0939) SpO2:  [97 %-100 %] 97 % (12/17 0939) Weight:  [85.4 kg (188 lb 4.4 oz)] 85.4 kg (188 lb 4.4 oz) (12/16 2000)  Intake/Output from previous day: 12/16 0701 - 12/17 0700 In: 2600 [I.V.:2600] Out: 805 [Urine:605; Blood:200] Intake/Output this shift:     Recent Labs  01/04/15 0520  HGB 10.8*    Recent Labs  01/04/15 0520  WBC 6.9  RBC 3.40*  HCT 32.3*  PLT 232    Recent Labs  01/04/15 0520  NA 137  K 3.9  CL 100*  CO2 31  BUN 6  CREATININE 0.87  GLUCOSE 174*  CALCIUM 8.3*   No results for input(s): LABPT, INR in the last 72 hours.  Neurologically intact LE . She was prone and has some numbness in her left arm. Grip intact. No left arm pain just mild numbness.   Assessment/Plan: 1 Day Post-Op Procedure(s) (LRB): TRANSFORAMINAL LUMBAR INTERBODY FUSION L3-4 WITH PEDICLE SCREWS, RODS, CAGE, LOCAL BONE GRAFT, VIVIGEN, ALLOGRAFT CANCELLOUS CHIPS (N/A) Up with therapy Needs to get brace on , not here yet.  [name] C 01/04/2015, 12:07 PM

## 2015-01-04 NOTE — Progress Notes (Signed)
Physical Therapy Evaluation Patient Details Name: [name] MRN: [id number] DOB: 30-Nov-1960 Today's Date: 01/04/2015   History of Present Illness  54 yo female with left greater than right LE radiculopathy. Failed conservative treatment. MRI showed severe L3-4 spinal stenosis. Post L3-4 PLIF.  Clinical Impression  Patient seen with sister present, good mobility overall.  Provided instruction on spine precautions with Good compliance during treatment.  Pain still a factor, but centralized to low back.  MIN guard overall assist for mobility plus cues for altered techniques due to precautions.  Able to climb full flight of stairs and ambulate 150 feet with RW.  Patient is physically able to transition to next level of care when medically appropriate.  Will continue on PT services for additional training and possible cane versus walker use.    Follow Up Recommendations Home health PT;Supervision - Intermittent    Equipment Recommendations  Rolling walker with 5" wheels    Recommendations for Other Services       Precautions / Restrictions Precautions Precautions: Back Precaution Booklet Issued: Yes (comment) Required Braces or Orthoses: Spinal Brace Spinal Brace: Lumbar corset Restrictions Weight Bearing Restrictions: No      Mobility  Bed Mobility Overal bed mobility: Needs Assistance Bed Mobility: Rolling;Sit to Sidelying;Sidelying to Sit Rolling: Supervision Sidelying to sit: Min guard     Sit to sidelying: Min assist General bed mobility comments: verbal and tactile cues for log roll technique, and for LE management  Transfers Overall transfer level: Needs assistance Equipment used: Rolling walker (2 wheeled) Transfers: Sit to/from Stand Sit to Stand: Supervision         General transfer comment: Cues for hand placement, IV line management  Ambulation/Gait Ambulation/Gait assistance: Supervision Ambulation Distance (Feet): 150 Feet Assistive device: Rolling  walker (2 wheeled) Gait Pattern/deviations: Step-through pattern Gait velocity: Mild decrease to normal speed   General Gait Details: May be able to use SPC  Stairs Stairs: Yes Stairs assistance: Min guard Stair Management: One rail Left Number of Stairs: 12 General stair comments: Mild vc's for technique  Wheelchair Mobility    Modified Rankin (Stroke Patients Only)       Balance Overall balance assessment: Needs assistance   Sitting balance-Leahy Scale: Good       Standing balance-Leahy Scale: Fair Standing balance comment: Feels better with RW, but can stand without.                             Pertinent Vitals/Pain Pain Assessment: 0-10 Pain Score: 7  Pain Location: Low back Pain Descriptors / Indicators: Aching Pain Intervention(s): Monitored during session;Patient requesting pain meds-RN notified;Limited activity within patient's tolerance    Home Living Family/patient expects to be discharged to:: Private residence Living Arrangements: Other relatives (To sister's home) Available Help at Discharge: Family Type of Home: House Home Access: Level entry     Home Layout: Two level (Bedroom and bathroom on 2nd floor) Home Equipment: None      Prior Function Level of Independence: Independent               Hand Dominance        Extremity/Trunk Assessment   Upper Extremity Assessment: Overall WFL for tasks assessed;Defer to OT evaluation           Lower Extremity Assessment: Overall WFL for tasks assessed         Communication   Communication: No difficulties  Cognition Arousal/Alertness: Awake/alert Behavior During Therapy:  WFL for tasks assessed/performed Overall Cognitive Status: Within Functional Limits for tasks assessed                      General Comments General comments (skin integrity, edema, etc.): Instruction for Don/Doff lumbar brace.    Exercises        Assessment/Plan    PT Assessment  Patient needs continued PT services  PT Diagnosis Difficulty walking;Acute pain   PT Problem List Decreased mobility;Decreased balance;Decreased knowledge of use of DME;Pain  PT Treatment Interventions DME instruction;Gait training;Stair training;Therapeutic activities;Therapeutic exercise;Functional mobility training   PT Goals (Current goals can be found in the Care Plan section) Acute Rehab PT Goals Patient Stated Goal: To get up and walk PT Goal Formulation: With patient Time For Goal Achievement: 01/18/15 Potential to Achieve Goals: Good    Frequency Min 5X/week   Barriers to discharge        Co-evaluation               End of Session Equipment Utilized During Treatment: Gait belt;Back brace Activity Tolerance: Patient tolerated treatment well;No increased pain Patient left: in chair;with call bell/phone within reach;with family/visitor present Nurse Communication: Mobility status         Time: 1420-1455 PT Time Calculation (min) (ACUTE ONLY): 35 min   Charges:   PT Evaluation $Initial PT Evaluation Tier I: 1 Procedure PT Treatments $Gait Training: 8-22 mins   PT G Codes:        Claribel Sachs L 01/16/2015, 3:12 PM

## 2015-01-05 DIAGNOSIS — M48062 Spinal stenosis, lumbar region with neurogenic claudication: Secondary | ICD-10-CM | POA: Diagnosis present

## 2015-01-05 MED ORDER — HYDROCODONE-ACETAMINOPHEN 5-325 MG PO TABS: 1.0000 | ORAL_TABLET | ORAL | Status: DC | PRN

## 2015-01-05 MED ORDER — METHOCARBAMOL 500 MG PO TABS: 500.0000 mg | ORAL_TABLET | Freq: Four times a day (QID) | ORAL | Status: DC | PRN

## 2015-01-05 MED ORDER — OXYCODONE-ACETAMINOPHEN 5-325 MG PO TABS: 2.0000 | ORAL_TABLET | ORAL | Status: DC | PRN

## 2015-01-05 NOTE — Progress Notes (Signed)
Physical Therapy Treatment Patient Details Name: [name] MRN: [id number] DOB: 1960-03-17 Today's Date: 01/05/2015    History of Present Illness 54 y.o. female with left greater than right LE radiculopathy. Failed conservative treatment. MRI showed severe L3-4 spinal stenosis. Post L3-4 PLIF.    PT Comments    Pt progressing towards physical therapy goals. Was able to negotiate stairs well and pt demonstrated improved gait distance this session. Offered use of SPC but pt declines - wants the RW for support. Will continue to follow.   Follow Up Recommendations  Home health PT;Supervision - Intermittent     Equipment Recommendations  Rolling walker with 5" wheels    Recommendations for Other Services       Precautions / Restrictions Precautions Precautions: Back Precaution Booklet Issued: No Precaution Comments: pt able to state 2/3 back precautions and OT also reviewed no lifting more than 5 pounds Required Braces or Orthoses: Spinal Brace Spinal Brace: Lumbar corset;Applied in sitting position Restrictions Weight Bearing Restrictions: No    Mobility  Bed Mobility Overal bed mobility: Needs Assistance Bed Mobility: Rolling;Sidelying to Sit Rolling: Supervision Sidelying to sit: Modified independent (Device/Increase time)       General bed mobility comments: Pt received sitting EOB.   Transfers Overall transfer level: Needs assistance Equipment used: Rolling walker (2 wheeled) Transfers: Sit to/from Stand Sit to Stand: Supervision         General transfer comment: VC's for hand placement on seated surface for safety.   Ambulation/Gait Ambulation/Gait assistance: Supervision Ambulation Distance (Feet): 350 Feet Assistive device: Rolling walker (2 wheeled) Gait Pattern/deviations: Step-through pattern;Decreased stride length;Trunk flexed Gait velocity: Decreased Gait velocity interpretation: Below normal speed for age/gender General Gait Details:  Ambulating well. Offered cane but pt declined. Wants to continue using the RW for support.    Stairs Stairs: Yes Stairs assistance: Min guard Stair Management: One rail Right;Step to pattern;Forwards Number of Stairs: 12 General stair comments: Pt demonstrated good technique.   Wheelchair Mobility    Modified Rankin (Stroke Patients Only)       Balance Overall balance assessment: Needs assistance Sitting-balance support: Feet supported;No upper extremity supported Sitting balance-Leahy Scale: Good     Standing balance support: No upper extremity supported Standing balance-Leahy Scale: Fair                      Cognition Arousal/Alertness: Awake/alert Behavior During Therapy: WFL for tasks assessed/performed Overall Cognitive Status: Within Functional Limits for tasks assessed       Memory: Decreased recall of precautions              Exercises      General Comments        Pertinent Vitals/Pain Pain Assessment: Faces Pain Score: 7  Faces Pain Scale: Hurts a little bit Pain Location: back Pain Descriptors / Indicators: Operative site guarding;Discomfort Pain Intervention(s): Limited activity within patient's tolerance;Monitored during session;Repositioned    Home Living                      Prior Function            PT Goals (current goals can now be found in the care plan section) Acute Rehab PT Goals Patient Stated Goal: get well PT Goal Formulation: With patient Time For Goal Achievement: 01/18/15 Potential to Achieve Goals: Good Progress towards PT goals: Progressing toward goals    Frequency  Min 5X/week    PT Plan Current plan remains appropriate  Co-evaluation             End of Session Equipment Utilized During Treatment: Back brace Activity Tolerance: Patient tolerated treatment well;No increased pain Patient left: in chair;with call bell/phone within reach;with family/visitor present     Time:  1126-1139 PT Time Calculation (min) (ACUTE ONLY): 13 min  Charges:  $Gait Training: 8-22 mins                    G Codes:      Rolinda Roan 01/07/2015, 1:17 PM  Rolinda Roan, PT, DPT Acute Rehabilitation Services Pager: [phone]

## 2015-01-05 NOTE — Progress Notes (Signed)
Utilization Review Completed.Dehaven Sine T12/18/2016  

## 2015-01-05 NOTE — Discharge Instructions (Signed)
Call if there is increasing drainage, fever greater than 101.5, severe head aches, and worsening nausea or light sensitivity. If shortness of breath, bloody cough or chest tightness or pain go to an emergency room. No lifting greater than 10 lbs. Avoid bending, stooping and twisting. Use brace when sitting and out of bed even to go to bathroom. Walk in house for first 2 weeks then may start to get out slowly increasing distances up to one mile by 4-6 weeks post op. After 5 days may shower and change dressing following bathing with shower.When bathing remove the brace shower and replace brace before getting out of the shower. If drainage, keep dry dressing and do not bathe the incision, use an moisture impervious dressing. Please call and return for scheduled follow up appointment 2 weeks from the time of surgery.  Spinal Fusion, Care After Refer to this sheet in the next few weeks. These instructions provide you with information on caring for yourself after your procedure. Your caregiver may also give you more specific instructions. Your treatment has been planned according to current medical practices, but problems sometimes occur. Call your caregiver if you have any problems or questions after your procedure. HOME CARE INSTRUCTIONS   Take whatever pain medicine has been prescribed by your caregiver. Do not take over-the-counter pain medicine unless directed otherwise by your caregiver.  Do not drive if you are taking narcotic pain medicines.  Change your bandage (dressing) if necessary or as directed by your caregiver.  Do not get your surgical cut (incision) wet. After a few days you may take quick showers (rather than baths), but keep your incision clean and dry. Covering the incision with plastic wrap while you shower should keep your incision dry. A few weeks after surgery, once your incision has healed and your caregiver says it is okay, you can take baths or go swimming.  If you  have been prescribed medicine to prevent your blood from clotting, follow the directions carefully.  Check the area around your incision often. Look for redness and swelling. Also, look for anything leaking from your wound. You can use a mirror or have a family member inspect your incision if it is in a place where it is difficult for you to see.  Ask your caregiver what activities you should avoid and for how long.  Walk as much as possible.  Do not lift anything heavier than 10 pounds (4.5 kilograms) until your caregiver says it is safe.  Do not twist or bend for a few weeks. Try not to pull on things. Avoid sitting for long periods of time. Change positions at least every hour.  Ask your caregiver what kinds of exercise you should do to make your back stronger and when you should begin doing these exercises. SEEK IMMEDIATE MEDICAL CARE IF:   Pain suddenly becomes much worse.  The incision area is red, swollen, bleeding, or leaking fluid.  Your legs or feet become increasingly painful, numb, weak, or swollen.  You have trouble controlling urination or bowel movements.  You have trouble breathing.  You have chest pain.  You have a fever. MAKE SURE YOU:  Understand these instructions.  Will watch your condition.  Will get help right away if you are not doing well or get worse.   This information is not intended to replace advice given to you by your health care provider. Make sure you discuss any questions you have with your health care provider.   Document  Released: 07/24/2004 Document Revised: 01/25/2014 Document Reviewed: 06/19/2014 Elsevier Interactive Patient Education [employer].

## 2015-01-05 NOTE — Progress Notes (Signed)
Pt d/c to home by car with family. Assessment stable. Prescriptions given. All questions answered. 

## 2015-01-05 NOTE — Progress Notes (Signed)
CM received consult for DME.CM made referral with [facility]/DME for rolling walker and 3 in1. Equipment is to be delivered to pt 's room prior to d/c. Pt made aware by CM. Whitman Hero RN,BSN,CM [phone]

## 2015-01-05 NOTE — Progress Notes (Signed)
Patient was ambulated this a.m. on the unit from room to nurses station on 5MW then back to her room without difficulty.  Received pain med prior to walk stated "feels good to move".

## 2015-01-05 NOTE — Progress Notes (Signed)
Subjective: 2 Days Post-Op Procedure(s) (LRB): TRANSFORAMINAL LUMBAR INTERBODY FUSION L3-4 WITH PEDICLE SCREWS, RODS, CAGE, LOCAL BONE GRAFT, VIVIGEN, ALLOGRAFT CANCELLOUS CHIPS (N/A) Patient reports pain as mild.    Objective: Vital signs in last 24 hours: Temp:  [98.4 F (36.9 C)-99.5 F (37.5 C)] 98.4 F (36.9 C) (12/18 0929) Pulse Rate:  [70-80] 76 (12/18 0929) Resp:  [18] 18 (12/18 0929) BP: (118-169)/(50-67) 169/65 mmHg (12/18 0929) SpO2:  [95 %-99 %] 98 % (12/18 0929)  Intake/Output from previous day:   Intake/Output this shift:     Recent Labs  01/04/15 0520  HGB 10.8*    Recent Labs  01/04/15 0520  WBC 6.9  RBC 3.40*  HCT 32.3*  PLT 232    Recent Labs  01/04/15 0520  NA 137  K 3.9  CL 100*  CO2 31  BUN 6  CREATININE 0.87  GLUCOSE 174*  CALCIUM 8.3*   No results for input(s): LABPT, INR in the last 72 hours.  Neurologically intact  Assessment/Plan: 2 Days Post-Op Procedure(s) (LRB): TRANSFORAMINAL LUMBAR INTERBODY FUSION L3-4 WITH PEDICLE SCREWS, RODS, CAGE, LOCAL BONE GRAFT, VIVIGEN, ALLOGRAFT CANCELLOUS CHIPS (N/A) Discharge home with home health  Channel Papandrea C 01/05/2015, 10:45 AM

## 2015-01-05 NOTE — Progress Notes (Addendum)
Occupational Therapy Treatment Patient Details Name: [name] MRN: [id number] DOB: [birth year] Today's Date: 01/05/2015    History of present illness 54 y.o. female with left greater than right LE radiculopathy. Failed conservative treatment. MRI showed severe L3-4 spinal stenosis. Post L3-4 PLIF.   OT comments  Pt progressing. Education provided in session and pt verbalized understanding. Feel pt is safe to d/c from OT standpoint.  Follow Up Recommendations  No OT follow up;Supervision - Intermittent    Equipment Recommendations  3 in 1 bedside comode; (pt bought AE)   Recommendations for Other Services      Precautions / Restrictions Precautions Precautions: Back Precaution Booklet Issued: No Precaution Comments: pt able to state 2/3 back precautions and OT also reviewed no lifting more than 5 pounds Required Braces or Orthoses: Spinal Brace Spinal Brace: Lumbar corset;Applied in sitting position Restrictions Weight Bearing Restrictions: No       Mobility Bed Mobility Overal bed mobility: Needs Assistance Bed Mobility: Rolling;Sidelying to Sit Rolling: Supervision Sidelying to sit: Modified independent (Device/Increase time)       General bed mobility comments: dropped rail down and HOB flat  Transfers Overall transfer level: Needs assistance   Transfers: Sit to/from Stand Sit to Stand/Stand to Sit: Supervision         General transfer comment: RW in front    Balance    No LOB in session.                               ADL Overall ADL's : Needs assistance/impaired                 Upper Body Dressing : Minimal assistance;Sitting Upper Body Dressing Details (indicate cue type and reason): back brace Lower Body Dressing: Supervision/safety;Set up;With adaptive equipment;Sit to/from stand Lower Body Dressing Details (indicate cue type and reason): donned panties Toilet Transfer: Supervision/safety;Ambulation;RW (sit to stand from  bed)       Tub/ Shower Transfer: Tub transfer;Min guard;Ambulation (used RW to ambulate towards simulated tub)   Functional mobility during ADLs: Rolling walker (Supervision-ambulation; Min guard-tub transfer; took a few steps without RW)    Comments: Educated on AE and what pt could use for toilet aid. Reviewed/educated incorporating precautions into functional activities. Discussed back brace. Recommended someone be with her for bathing and tub/shower transfer. Discussed use of bag on walker.      Vision                     Perception     Praxis      Cognition  Awake/Alert Behavior During Therapy: WFL for tasks assessed/performed Overall Cognitive Status: Within Functional Limits for tasks assessed       Memory: Decreased short-term memory (decreased recall of precaution)               Extremity/Trunk Assessment               Exercises     Shoulder Instructions       General Comments      Pertinent Vitals/ Pain       Pain Assessment: 0-10 Pain Score: 7  Pain Location: back Pain Intervention(s): Monitored during session (pillow placed behind back)  Home Living  Prior Functioning/Environment              Frequency Min 2X/week     Progress Toward Goals  OT Goals(current goals can now be found in the care plan section)  Progress towards OT goals: Progressing toward goals  Acute Rehab OT Goals Patient Stated Goal: get well OT Goal Formulation: With patient Time For Goal Achievement: 01/11/15 Potential to Achieve Goals: Good ADL Goals Pt Will Perform Lower Body Dressing: with set-up;with supervision;with adaptive equipment;sit to/from stand Pt Will Transfer to Toilet: with modified independence;ambulating;grab bars (elevated toilet) Pt Will Perform Tub/Shower Transfer: Tub transfer;with supervision;ambulating;with set-up;3 in 1;rolling walker Additional ADL Goal #1: Pt will  independently verbalize 3/3 back precautions and maintain during session.  Plan Discharge plan remains appropriate    Co-evaluation                 End of Session Equipment Utilized During Treatment: Gait belt;Rolling walker;Back brace   Activity Tolerance Patient tolerated treatment well   Patient Left in chair;with call bell/phone within reach   Nurse Communication          Time: 6962-9528 OT Time Calculation (min): 14 min  Charges: OT General Charges $OT Visit: 1 Procedure OT Treatments $Self Care/Home Management : 8-22 mins  Benito Mccreedy OTR/L [phone] 01/05/2015, 9:37 AM

## 2015-01-06 MED FILL — Heparin Sodium (Porcine) Inj 1000 Unit/ML: INTRAMUSCULAR | Qty: 30 | Status: AC

## 2015-01-06 MED FILL — Sodium Chloride IV Soln 0.9%: INTRAVENOUS | Qty: 1000 | Status: AC

## 2015-01-09 NOTE — Discharge Summary (Signed)
Physician Discharge Summary      Patient ID: [name] MRN: [id number] DOB/AGE: January 31, 1960 54 y.o.  Admit date: 01/03/2015 Discharge date: 01/05/2015  Admission Diagnoses:  Principal Problem:   Spondylolisthesis of lumbar region Active Problems:   Spinal stenosis, lumbar region, with neurogenic claudication   Lumbar stenosis with neurogenic claudication   Discharge Diagnoses:  Same  Past Medical History  Diagnosis Date  . HTN (hypertension)   . HLD (hyperlipidemia)   . Osteoarthrosis involving more than one site but not generalized   . Depression   . GERD (gastroesophageal reflux disease)   . Chronic abdominal pain   . Chronic joint pain   . Chronic back pain   . Cancer ([place]) 1997    left lung  . Palpitations 01/2013    chronic, intermittent  . Nausea and vomiting     chronic, recurrent  . Asthma     every now and then.  Wheezing and coughing  . Shortness of breath dyspnea     with exertion    Surgeries: Procedure(s): TRANSFORAMINAL LUMBAR INTERBODY FUSION L3-4 WITH PEDICLE SCREWS, RODS, CAGE, LOCAL BONE GRAFT, VIVIGEN, ALLOGRAFT CANCELLOUS CHIPS on 01/03/2015   Consultants:    Discharged Condition: Improved  Hospital Course: [name] is an 54 y.o. female who was admitted 01/03/2015 with a chief complaint of No chief complaint on file. , and found to have a diagnosis of Spondylolisthesis of lumbar region.  She was brought to the operating room on 01/03/2015 and underwent the above named procedures.    She was given perioperative antibiotics:  Anti-infectives    Start     Dose/Rate Route Frequency Ordered Stop   01/04/15 0000  ceFAZolin (ANCEF) IVPB 1 g/50 mL premix     1 g 100 mL/hr over 30 Minutes Intravenous Every 8 hours 01/03/15 1951 01/04/15 0909   01/03/15 2200  acyclovir (ZOVIRAX) tablet 400 mg  Status:  Discontinued     400 mg Oral 2 times daily 01/03/15 1951 01/05/15 2059   01/03/15 0533  ceFAZolin (ANCEF) IVPB 2 g/50 mL premix     2  g 100 mL/hr over 30 Minutes Intravenous On call to O.R. 01/03/15 0533 01/03/15 1658    POD#1 awake, alert and oriented x 4, brace obtained and she was started on PT/OT. Foley discontinued. Transitioned to po narcotic pain medications. Able to ambulate in hallway. POD#2 VSS, awake, alert and oriented x 4. Dressing changed, incision was dry without  Drainage or erythrema. Participated with PT/OT and demonstrated good understanding of restrictions. She was  Discharged home on POD#2.   She was given sequential compression devices and early ambulation for DVT prophylaxis.  She benefited maximally from their hospital stay and there were no complications.    Recent vital signs:  Filed Vitals:   01/05/15 0929 01/05/15 1316  BP: 169/65 148/66  Pulse: 76 69  Temp: 98.4 F (36.9 C) 99.1 F (37.3 C)  Resp: 18 18    Recent laboratory studies:  Results for orders placed or performed during the hospital encounter of 01/03/15  CBC  Result Value Ref Range   WBC 6.9 4.0 - 10.5 K/uL   RBC 3.40 (L) 3.87 - 5.11 MIL/uL   Hemoglobin 10.8 (L) 12.0 - 15.0 g/dL   HCT 32.3 (L) 36.0 - 46.0 %   MCV 95.0 78.0 - 100.0 fL   MCH 31.8 26.0 - 34.0 pg   MCHC 33.4 30.0 - 36.0 g/dL   RDW 12.7 11.5 - 15.5 %  Platelets 232 150 - 400 K/uL  Basic Metabolic Panel  Result Value Ref Range   Sodium 137 135 - 145 mmol/L   Potassium 3.9 3.5 - 5.1 mmol/L   Chloride 100 (L) 101 - 111 mmol/L   CO2 31 22 - 32 mmol/L   Glucose, Bld 174 (H) 65 - 99 mg/dL   BUN 6 6 - 20 mg/dL   Creatinine, Ser 0.87 0.44 - 1.00 mg/dL   Calcium 8.3 (L) 8.9 - 10.3 mg/dL   GFR calc non Af Amer >60 >60 mL/min   GFR calc Af Amer >60 >60 mL/min   Anion gap 6 5 - 15  ABO/Rh  Result Value Ref Range   ABO/RH(D) O POS     Discharge Medications:     Medication List    TAKE these medications        acyclovir 400 MG tablet  Commonly known as:  ZOVIRAX  TAKE ONE TABLET BY MOUTH TWICE DAILY     albuterol 108 (90 BASE) MCG/ACT inhaler   Commonly known as:  PROAIR HFA  Inhale 2 puffs into the lungs every 4 (four) hours as needed. for shortness of breath     AMITIZA 24 MCG capsule  Generic drug:  lubiprostone  TAKE ONE CAPSULE BY MOUTH TWICE DAILY WITH MEALS     amitriptyline 150 MG tablet  Commonly known as:  ELAVIL  Take 1 tablet (150 mg total) by mouth daily with breakfast.     atorvastatin 40 MG tablet  Commonly known as:  LIPITOR  TAKE ONE TABLET BY MOUTH ONCE DAILY     Calcium Citrate-Vitamin D 250-200 MG-UNIT Tabs  Take 1 tablet by mouth daily.     DEXILANT 60 MG capsule  Generic drug:  dexlansoprazole  TAKE ONE CAPSULE BY MOUTH ONCE DAILY     DULoxetine 60 MG capsule  Commonly known as:  CYMBALTA  Take 1 capsule (60 mg total) by mouth 2 (two) times daily.     gabapentin 300 MG capsule  Commonly known as:  NEURONTIN  TAKE ONE CAPSULE BY MOUTH THREE TIMES DAILY     hydrochlorothiazide 25 MG tablet  Commonly known as:  HYDRODIURIL  Take 1 tablet (25 mg total) by mouth daily.     meloxicam 15 MG tablet  Commonly known as:  MOBIC  Take 1 tablet (15 mg total) by mouth daily.     methocarbamol 500 MG tablet  Commonly known as:  ROBAXIN  Take 1 tablet (500 mg total) by mouth every 6 (six) hours as needed for muscle spasms.     oxyCODONE-acetaminophen 5-325 MG tablet  Commonly known as:  ROXICET  Take 2 tablets by mouth every 4 (four) hours as needed for severe pain.        Diagnostic Studies: Dg Lumbar Spine Complete  01/03/2015  CLINICAL DATA:  L3-4 lumbar fusion. EXAM: LUMBAR SPINE - COMPLETE 4+ VIEW; DG C-ARM GT 120 MIN COMPARISON:  12/06/2014 MR. FINDINGS: 3 intraoperative spot views of the mid lumbar spine are submitted postoperatively for interpretation. Posterior rod and bi pedicular screw fixation and interbody fusion marker noted at what appears to be L3-4. IMPRESSION: Fusion changes at what appear to be L3-4 - correlate. Electronically Signed   By: Margarette Canada M.D.   On: 01/03/2015 17:16    Dg C-arm Gt 120 Min  01/03/2015  CLINICAL DATA:  L3-4 lumbar fusion. EXAM: LUMBAR SPINE - COMPLETE 4+ VIEW; DG C-ARM GT 120 MIN COMPARISON:  12/06/2014 MR. FINDINGS: 3  intraoperative spot views of the mid lumbar spine are submitted postoperatively for interpretation. Posterior rod and bi pedicular screw fixation and interbody fusion marker noted at what appears to be L3-4. IMPRESSION: Fusion changes at what appear to be L3-4 - correlate. Electronically Signed   By: Margarette Canada M.D.   On: 01/03/2015 17:16    Disposition: 01-Home or Self Care        Follow-up Information    Follow up with Wesleigh Markovic E, MD In 2 weeks.   Specialty:  Orthopedic Surgery   Why:  For wound re-check   Contact information:   [place] [id number] [phone]       Follow up with [facility].   Why:  rolling walker and 3 in1 to be delivered to room   Contact information:   [place] [address] [phone]        Signed: Jessy Oto 01/09/2015, 6:17 PM

## 2015-01-22 DIAGNOSIS — M4806 Spinal stenosis, lumbar region: Secondary | ICD-10-CM | POA: Diagnosis not present

## 2015-01-22 DIAGNOSIS — M4317 Spondylolisthesis, lumbosacral region: Secondary | ICD-10-CM | POA: Diagnosis not present

## 2015-02-11 ENCOUNTER — Other Ambulatory Visit: Payer: Self-pay | Admitting: *Deleted

## 2015-02-11 DIAGNOSIS — I10 Essential (primary) hypertension: Secondary | ICD-10-CM

## 2015-02-12 MED ORDER — HYDROCHLOROTHIAZIDE 25 MG PO TABS: 25.0000 mg | ORAL_TABLET | Freq: Every day | ORAL | Status: DC

## 2015-02-18 ENCOUNTER — Other Ambulatory Visit: Payer: Self-pay | Admitting: Family Medicine

## 2015-02-18 DIAGNOSIS — M48062 Spinal stenosis, lumbar region with neurogenic claudication: Secondary | ICD-10-CM

## 2015-02-21 ENCOUNTER — Other Ambulatory Visit: Payer: Self-pay | Admitting: *Deleted

## 2015-02-21 DIAGNOSIS — E7800 Pure hypercholesterolemia, unspecified: Secondary | ICD-10-CM

## 2015-02-21 DIAGNOSIS — M533 Sacrococcygeal disorders, not elsewhere classified: Secondary | ICD-10-CM | POA: Diagnosis not present

## 2015-02-21 MED ORDER — ATORVASTATIN CALCIUM 40 MG PO TABS: 40.0000 mg | ORAL_TABLET | Freq: Every day | ORAL | Status: DC

## 2015-02-28 ENCOUNTER — Other Ambulatory Visit (HOSPITAL_COMMUNITY): Payer: Self-pay | Admitting: Specialist

## 2015-02-28 DIAGNOSIS — M533 Sacrococcygeal disorders, not elsewhere classified: Secondary | ICD-10-CM

## 2015-02-28 DIAGNOSIS — R102 Pelvic and perineal pain: Secondary | ICD-10-CM

## 2015-03-04 ENCOUNTER — Encounter (HOSPITAL_COMMUNITY): Payer: Self-pay

## 2015-03-04 ENCOUNTER — Encounter (HOSPITAL_COMMUNITY)
Admission: RE | Admit: 2015-03-04 | Discharge: 2015-03-04 | Disposition: A | Discharge status: Home / Self Care | Payer: Medicare Other | Source: Ambulatory Visit | Attending: Specialist | Admitting: Specialist

## 2015-03-04 DIAGNOSIS — R102 Pelvic and perineal pain: Secondary | ICD-10-CM | POA: Diagnosis not present

## 2015-03-04 DIAGNOSIS — M533 Sacrococcygeal disorders, not elsewhere classified: Secondary | ICD-10-CM | POA: Diagnosis not present

## 2015-03-04 MED ORDER — TECHNETIUM TC 99M MEDRONATE IV KIT
25.0000 | PACK | Freq: Once | INTRAVENOUS | Status: AC | PRN
  Administered 2015-03-04: 25 via INTRAVENOUS

## 2015-03-20 DIAGNOSIS — M533 Sacrococcygeal disorders, not elsewhere classified: Secondary | ICD-10-CM | POA: Diagnosis not present

## 2015-04-04 ENCOUNTER — Other Ambulatory Visit: Payer: Self-pay | Admitting: Family Medicine

## 2015-04-04 DIAGNOSIS — A609 Anogenital herpesviral infection, unspecified: Secondary | ICD-10-CM

## 2015-04-07 ENCOUNTER — Other Ambulatory Visit: Payer: Self-pay | Admitting: Specialist

## 2015-04-08 NOTE — Pre-Procedure Instructions (Signed)
    [name]  04/08/2015      [facility] - [place], [place] [address] [place] [address] Phone: [phone] Fax: [phone]  [facility] [place] - [address] [place] [address] [place] [address] Phone: [phone] Fax: [phone]    Your procedure is scheduled on Friday, March 24  Report to [facility] Admitting at 10:30 A.M.               Your surgery or procedure is scheduled for 12:30 PM   Call this number if you have problems the morning of surgery:[phone]   Remember:  Do not eat food or drink liquids after midnight Thursday, March 23.  Take these medicines the morning of surgery with A SIP OF WATER :amitriptyline (ELAVIL),    DEXILANT,  DULoxetine (CYMBALTA).                  May take oxyCODONE-acetaminophen (ROXICET) if needed and you can tolerate on an empty stomach.                  Stop taking meloxicam (MOBIC), do not take any Aspirin, Aspirin products, Ibuprofen.naproxen, Herbal Medications, Vitamins.   Do not wear jewelry, make-up or nail polish.  Do not wear lotions, powders, or perfumes.  Do not shave 48 hours prior to surgery.  Do not bring valuables to the hospital.  [facility] is not responsible for any belongings or valuables.  Contacts, dentures or bridgework may not be worn into surgery.  Leave your suitcase in the car.  After surgery it may be brought to your room.  For patients admitted to the hospital, discharge time will be determined by your treatment team.  Patients discharged the day of surgery will not be allowed to drive home.   Name and phone number of your driver:  - Special instructions:  -  Please read over the following fact sheets that you were given. Pain Booklet, Coughing and Deep Breathing and Surgical Site Infection Prevention

## 2015-04-09 ENCOUNTER — Encounter (HOSPITAL_COMMUNITY): Payer: Self-pay

## 2015-04-09 ENCOUNTER — Encounter (HOSPITAL_COMMUNITY)
Admission: RE | Admit: 2015-04-09 | Discharge: 2015-04-09 | Disposition: A | Discharge status: Home / Self Care | Payer: Medicare Other | Source: Ambulatory Visit | Attending: Specialist | Admitting: Specialist

## 2015-04-09 DIAGNOSIS — E785 Hyperlipidemia, unspecified: Secondary | ICD-10-CM | POA: Diagnosis not present

## 2015-04-09 DIAGNOSIS — M533 Sacrococcygeal disorders, not elsewhere classified: Secondary | ICD-10-CM | POA: Diagnosis not present

## 2015-04-09 DIAGNOSIS — Z85118 Personal history of other malignant neoplasm of bronchus and lung: Secondary | ICD-10-CM | POA: Diagnosis not present

## 2015-04-09 DIAGNOSIS — I10 Essential (primary) hypertension: Secondary | ICD-10-CM | POA: Diagnosis not present

## 2015-04-09 DIAGNOSIS — K219 Gastro-esophageal reflux disease without esophagitis: Secondary | ICD-10-CM | POA: Diagnosis not present

## 2015-04-09 DIAGNOSIS — R0602 Shortness of breath: Secondary | ICD-10-CM | POA: Diagnosis not present

## 2015-04-09 DIAGNOSIS — Z87891 Personal history of nicotine dependence: Secondary | ICD-10-CM | POA: Diagnosis not present

## 2015-04-09 DIAGNOSIS — F329 Major depressive disorder, single episode, unspecified: Secondary | ICD-10-CM | POA: Diagnosis not present

## 2015-04-09 LAB — COMPREHENSIVE METABOLIC PANEL
ALT: 18 U/L (ref 14–54)
ANION GAP: 14 (ref 5–15)
AST: 17 U/L (ref 15–41)
Albumin: 3.6 g/dL (ref 3.5–5.0)
Alkaline Phosphatase: 153 U/L — ABNORMAL HIGH (ref 38–126)
BUN: <5 mg/dL — ABNORMAL LOW (ref 6–20)
CHLORIDE: 98 mmol/L — AB (ref 101–111)
CO2: 29 mmol/L (ref 22–32)
Calcium: 9.1 mg/dL (ref 8.9–10.3)
Creatinine, Ser: 0.83 mg/dL (ref 0.44–1.00)
Glucose, Bld: 112 mg/dL — ABNORMAL HIGH (ref 65–99)
POTASSIUM: 3 mmol/L — AB (ref 3.5–5.1)
Sodium: 141 mmol/L (ref 135–145)
Total Bilirubin: 0.6 mg/dL (ref 0.3–1.2)
Total Protein: 7 g/dL (ref 6.5–8.1)

## 2015-04-09 LAB — CBC
HCT: 37.4 % (ref 36.0–46.0)
Hemoglobin: 12.5 g/dL (ref 12.0–15.0)
MCH: 30.6 pg (ref 26.0–34.0)
MCHC: 33.4 g/dL (ref 30.0–36.0)
MCV: 91.7 fL (ref 78.0–100.0)
PLATELETS: 258 10*3/uL (ref 150–400)
RBC: 4.08 MIL/uL (ref 3.87–5.11)
RDW: 13.3 % (ref 11.5–15.5)
WBC: 5.3 10*3/uL (ref 4.0–10.5)

## 2015-04-11 ENCOUNTER — Ambulatory Visit (HOSPITAL_COMMUNITY): Payer: Medicare Other | Admitting: Emergency Medicine

## 2015-04-11 ENCOUNTER — Ambulatory Visit (HOSPITAL_COMMUNITY): Payer: Medicare Other | Admitting: Anesthesiology

## 2015-04-11 ENCOUNTER — Encounter (HOSPITAL_COMMUNITY)
Admission: RE | Disposition: A | Discharge status: Home / Self Care | Payer: Self-pay | Source: Ambulatory Visit | Attending: Specialist

## 2015-04-11 ENCOUNTER — Observation Stay (HOSPITAL_COMMUNITY)
Admission: RE | Admit: 2015-04-11 | Discharge: 2015-04-12 | Disposition: A | Discharge status: Home / Self Care | Payer: Medicare Other | Source: Ambulatory Visit | Attending: Specialist | Admitting: Specialist

## 2015-04-11 ENCOUNTER — Encounter (HOSPITAL_COMMUNITY): Payer: Self-pay | Admitting: *Deleted

## 2015-04-11 DIAGNOSIS — K219 Gastro-esophageal reflux disease without esophagitis: Secondary | ICD-10-CM | POA: Diagnosis not present

## 2015-04-11 DIAGNOSIS — E785 Hyperlipidemia, unspecified: Secondary | ICD-10-CM | POA: Insufficient documentation

## 2015-04-11 DIAGNOSIS — M549 Dorsalgia, unspecified: Secondary | ICD-10-CM | POA: Diagnosis not present

## 2015-04-11 DIAGNOSIS — M533 Sacrococcygeal disorders, not elsewhere classified: Principal | ICD-10-CM | POA: Insufficient documentation

## 2015-04-11 DIAGNOSIS — I10 Essential (primary) hypertension: Secondary | ICD-10-CM | POA: Insufficient documentation

## 2015-04-11 DIAGNOSIS — M4056 Lordosis, unspecified, lumbar region: Secondary | ICD-10-CM | POA: Diagnosis not present

## 2015-04-11 DIAGNOSIS — Z85118 Personal history of other malignant neoplasm of bronchus and lung: Secondary | ICD-10-CM | POA: Insufficient documentation

## 2015-04-11 DIAGNOSIS — R0602 Shortness of breath: Secondary | ICD-10-CM | POA: Insufficient documentation

## 2015-04-11 DIAGNOSIS — Z87891 Personal history of nicotine dependence: Secondary | ICD-10-CM | POA: Diagnosis not present

## 2015-04-11 DIAGNOSIS — F329 Major depressive disorder, single episode, unspecified: Secondary | ICD-10-CM | POA: Insufficient documentation

## 2015-04-11 HISTORY — PX: COCCYGECTOMY: SHX5011

## 2015-04-11 SURGERY — COCCYGECTOMY
Anesthesia: General | Site: Coccyx

## 2015-04-11 MED ORDER — HYDROMORPHONE HCL 1 MG/ML IJ SOLN
0.2500 mg | INTRAMUSCULAR | Status: DC | PRN
  Administered 2015-04-11 (×2): 0.5 mg via INTRAVENOUS

## 2015-04-11 MED ORDER — CEFAZOLIN SODIUM-DEXTROSE 2-4 GM/100ML-% IV SOLN
2.0000 g | INTRAVENOUS | Status: AC
  Administered 2015-04-11: 2 g via INTRAVENOUS

## 2015-04-11 MED ORDER — BUPIVACAINE HCL (PF) 0.5 % IJ SOLN
INTRAMUSCULAR | Status: AC
  Filled 2015-04-11: qty 30

## 2015-04-11 MED ORDER — OXYCODONE-ACETAMINOPHEN 5-325 MG PO TABS
1.0000 | ORAL_TABLET | ORAL | Status: DC | PRN
  Administered 2015-04-12: 2 via ORAL
  Filled 2015-04-11: qty 2

## 2015-04-11 MED ORDER — LIDOCAINE HCL (CARDIAC) 20 MG/ML IV SOLN
INTRAVENOUS | Status: AC
  Filled 2015-04-11: qty 5

## 2015-04-11 MED ORDER — ROCURONIUM BROMIDE 100 MG/10ML IV SOLN
INTRAVENOUS | Status: DC | PRN
  Administered 2015-04-11: 40 mg via INTRAVENOUS

## 2015-04-11 MED ORDER — ATORVASTATIN CALCIUM 40 MG PO TABS
40.0000 mg | ORAL_TABLET | Freq: Every evening | ORAL | Status: DC
  Administered 2015-04-11: 40 mg via ORAL
  Filled 2015-04-11: qty 1

## 2015-04-11 MED ORDER — ACETAMINOPHEN 325 MG PO TABS: 650.0000 mg | ORAL_TABLET | ORAL | Status: DC | PRN

## 2015-04-11 MED ORDER — KETOROLAC TROMETHAMINE 30 MG/ML IJ SOLN
30.0000 mg | Freq: Once | INTRAMUSCULAR | Status: AC
  Administered 2015-04-11: 30 mg via INTRAVENOUS
  Filled 2015-04-11: qty 1

## 2015-04-11 MED ORDER — SODIUM CHLORIDE 0.9 % IV SOLN: 250.0000 mL | INTRAVENOUS | Status: DC

## 2015-04-11 MED ORDER — SUGAMMADEX SODIUM 200 MG/2ML IV SOLN
INTRAVENOUS | Status: DC | PRN
  Administered 2015-04-11: 160 mg via INTRAVENOUS

## 2015-04-11 MED ORDER — ONDANSETRON HCL 4 MG/2ML IJ SOLN
INTRAMUSCULAR | Status: AC
  Filled 2015-04-11: qty 2

## 2015-04-11 MED ORDER — FENTANYL CITRATE (PF) 250 MCG/5ML IJ SOLN
INTRAMUSCULAR | Status: AC
  Filled 2015-04-11: qty 5

## 2015-04-11 MED ORDER — ONDANSETRON HCL 4 MG/2ML IJ SOLN: 4.0000 mg | Freq: Once | INTRAMUSCULAR | Status: DC | PRN

## 2015-04-11 MED ORDER — ALBUTEROL SULFATE (2.5 MG/3ML) 0.083% IN NEBU: 3.0000 mL | INHALATION_SOLUTION | RESPIRATORY_TRACT | Status: DC | PRN

## 2015-04-11 MED ORDER — OXYCODONE-ACETAMINOPHEN 5-325 MG PO TABS: 1.0000 | ORAL_TABLET | Freq: Four times a day (QID) | ORAL | Status: DC | PRN

## 2015-04-11 MED ORDER — MIDAZOLAM HCL 5 MG/5ML IJ SOLN
INTRAMUSCULAR | Status: DC | PRN
  Administered 2015-04-11 (×2): 1 mg via INTRAVENOUS

## 2015-04-11 MED ORDER — LUBIPROSTONE 24 MCG PO CAPS
24.0000 ug | ORAL_CAPSULE | Freq: Two times a day (BID) | ORAL | Status: DC
  Administered 2015-04-11: 24 ug via ORAL
  Filled 2015-04-11 (×2): qty 1

## 2015-04-11 MED ORDER — DULOXETINE HCL 60 MG PO CPEP
60.0000 mg | ORAL_CAPSULE | Freq: Two times a day (BID) | ORAL | Status: DC
  Filled 2015-04-11: qty 1

## 2015-04-11 MED ORDER — BUPIVACAINE LIPOSOME 1.3 % IJ SUSP
20.0000 mL | INTRAMUSCULAR | Status: DC
  Filled 2015-04-11: qty 20

## 2015-04-11 MED ORDER — ONDANSETRON HCL 4 MG/2ML IJ SOLN
INTRAMUSCULAR | Status: DC | PRN
  Administered 2015-04-11: 4 mg via INTRAVENOUS

## 2015-04-11 MED ORDER — CALCIUM CITRATE-VITAMIN D 250-200 MG-UNIT PO TABS: 1.0000 | ORAL_TABLET | Freq: Every day | ORAL | Status: DC

## 2015-04-11 MED ORDER — DOCUSATE SODIUM 100 MG PO CAPS
100.0000 mg | ORAL_CAPSULE | Freq: Two times a day (BID) | ORAL | Status: DC
  Administered 2015-04-11 (×2): 100 mg via ORAL
  Filled 2015-04-11 (×2): qty 1

## 2015-04-11 MED ORDER — PROPOFOL 10 MG/ML IV BOLUS
INTRAVENOUS | Status: AC
  Filled 2015-04-11: qty 20

## 2015-04-11 MED ORDER — ACETAMINOPHEN 650 MG RE SUPP: 650.0000 mg | RECTAL | Status: DC | PRN

## 2015-04-11 MED ORDER — HYDROMORPHONE HCL 1 MG/ML IJ SOLN
INTRAMUSCULAR | Status: AC
  Administered 2015-04-11: 0.5 mg via INTRAVENOUS
  Filled 2015-04-11: qty 1

## 2015-04-11 MED ORDER — METHOCARBAMOL 500 MG PO TABS: 500.0000 mg | ORAL_TABLET | Freq: Three times a day (TID) | ORAL | Status: DC | PRN

## 2015-04-11 MED ORDER — PANTOPRAZOLE SODIUM 40 MG PO TBEC
40.0000 mg | DELAYED_RELEASE_TABLET | Freq: Every day | ORAL | Status: DC
  Administered 2015-04-12: 40 mg via ORAL
  Filled 2015-04-11: qty 1

## 2015-04-11 MED ORDER — SODIUM CHLORIDE 0.9 % IV SOLN: INTRAVENOUS | Status: DC

## 2015-04-11 MED ORDER — LIDOCAINE HCL (CARDIAC) 20 MG/ML IV SOLN
INTRAVENOUS | Status: DC | PRN
  Administered 2015-04-11: 60 mg via INTRAVENOUS

## 2015-04-11 MED ORDER — 0.9 % SODIUM CHLORIDE (POUR BTL) OPTIME
TOPICAL | Status: DC | PRN
  Administered 2015-04-11: 1000 mL

## 2015-04-11 MED ORDER — ROCURONIUM BROMIDE 50 MG/5ML IV SOLN
INTRAVENOUS | Status: AC
  Filled 2015-04-11: qty 1

## 2015-04-11 MED ORDER — AMITRIPTYLINE HCL 10 MG PO TABS
10.0000 mg | ORAL_TABLET | Freq: Every day | ORAL | Status: DC
  Administered 2015-04-11: 10 mg via ORAL
  Filled 2015-04-11: qty 1

## 2015-04-11 MED ORDER — MENTHOL 3 MG MT LOZG: 1.0000 | LOZENGE | OROMUCOSAL | Status: DC | PRN

## 2015-04-11 MED ORDER — DEXTROSE 5 % IV SOLN
500.0000 mg | Freq: Four times a day (QID) | INTRAVENOUS | Status: DC | PRN
  Filled 2015-04-11: qty 5

## 2015-04-11 MED ORDER — CEFAZOLIN SODIUM 1-5 GM-% IV SOLN
1.0000 g | Freq: Three times a day (TID) | INTRAVENOUS | Status: AC
  Administered 2015-04-11 – 2015-04-12 (×2): 1 g via INTRAVENOUS
  Filled 2015-04-11 (×2): qty 50

## 2015-04-11 MED ORDER — FLEET ENEMA 7-19 GM/118ML RE ENEM: 1.0000 | ENEMA | Freq: Once | RECTAL | Status: DC | PRN

## 2015-04-11 MED ORDER — ACYCLOVIR 400 MG PO TABS
400.0000 mg | ORAL_TABLET | Freq: Two times a day (BID) | ORAL | Status: DC
Start: 2015-04-11 — End: 2015-04-12
  Administered 2015-04-11: 400 mg via ORAL
  Filled 2015-04-11 (×3): qty 1

## 2015-04-11 MED ORDER — METHOCARBAMOL 500 MG PO TABS
500.0000 mg | ORAL_TABLET | Freq: Four times a day (QID) | ORAL | Status: DC | PRN
  Administered 2015-04-11: 500 mg via ORAL
  Filled 2015-04-11: qty 1

## 2015-04-11 MED ORDER — LACTATED RINGERS IV SOLN
INTRAVENOUS | Status: DC
  Administered 2015-04-11: 12:00:00 via INTRAVENOUS

## 2015-04-11 MED ORDER — PHENOL 1.4 % MT LIQD: 1.0000 | OROMUCOSAL | Status: DC | PRN

## 2015-04-11 MED ORDER — POLYETHYLENE GLYCOL 3350 17 G PO PACK: 17.0000 g | PACK | Freq: Every day | ORAL | Status: DC | PRN

## 2015-04-11 MED ORDER — BUPIVACAINE LIPOSOME 1.3 % IJ SUSP
INTRAMUSCULAR | Status: DC | PRN
  Administered 2015-04-11: 6 mL

## 2015-04-11 MED ORDER — ONDANSETRON HCL 4 MG/2ML IJ SOLN: 4.0000 mg | INTRAMUSCULAR | Status: DC | PRN

## 2015-04-11 MED ORDER — SODIUM CHLORIDE 0.45 % IV SOLN
INTRAVENOUS | Status: DC
  Administered 2015-04-12: 04:00:00 via INTRAVENOUS

## 2015-04-11 MED ORDER — SUGAMMADEX SODIUM 200 MG/2ML IV SOLN
INTRAVENOUS | Status: AC
  Filled 2015-04-11: qty 2

## 2015-04-11 MED ORDER — CHLORHEXIDINE GLUCONATE 4 % EX LIQD: 60.0000 mL | Freq: Once | CUTANEOUS | Status: DC

## 2015-04-11 MED ORDER — MIDAZOLAM HCL 2 MG/2ML IJ SOLN
INTRAMUSCULAR | Status: AC
  Filled 2015-04-11: qty 2

## 2015-04-11 MED ORDER — SODIUM CHLORIDE 0.9% FLUSH: 3.0000 mL | INTRAVENOUS | Status: DC | PRN

## 2015-04-11 MED ORDER — SODIUM CHLORIDE 0.9% FLUSH
3.0000 mL | Freq: Two times a day (BID) | INTRAVENOUS | Status: DC
Start: 2015-04-11 — End: 2015-04-12

## 2015-04-11 MED ORDER — BUPIVACAINE HCL (PF) 0.5 % IJ SOLN
INTRAMUSCULAR | Status: DC | PRN
  Administered 2015-04-11: 6 mL

## 2015-04-11 MED ORDER — CEFAZOLIN SODIUM-DEXTROSE 2-4 GM/100ML-% IV SOLN
INTRAVENOUS | Status: AC
  Filled 2015-04-11: qty 100

## 2015-04-11 MED ORDER — OXYCODONE-ACETAMINOPHEN 5-325 MG PO TABS: 2.0000 | ORAL_TABLET | ORAL | Status: DC | PRN

## 2015-04-11 MED ORDER — MELOXICAM 7.5 MG PO TABS
15.0000 mg | ORAL_TABLET | Freq: Every day | ORAL | Status: DC
  Filled 2015-04-11: qty 1

## 2015-04-11 MED ORDER — HYDROCHLOROTHIAZIDE 25 MG PO TABS
25.0000 mg | ORAL_TABLET | Freq: Every day | ORAL | Status: DC
  Administered 2015-04-11: 25 mg via ORAL
  Filled 2015-04-11: qty 1

## 2015-04-11 MED ORDER — PROPOFOL 10 MG/ML IV BOLUS
INTRAVENOUS | Status: DC | PRN
  Administered 2015-04-11: 170 mg via INTRAVENOUS

## 2015-04-11 MED ORDER — ZOLPIDEM TARTRATE 5 MG PO TABS: 5.0000 mg | ORAL_TABLET | Freq: Every evening | ORAL | Status: DC | PRN

## 2015-04-11 MED ORDER — FENTANYL CITRATE (PF) 100 MCG/2ML IJ SOLN
INTRAMUSCULAR | Status: DC | PRN
  Administered 2015-04-11: 100 ug via INTRAVENOUS
  Administered 2015-04-11 (×3): 50 ug via INTRAVENOUS

## 2015-04-11 MED ORDER — GABAPENTIN 300 MG PO CAPS
300.0000 mg | ORAL_CAPSULE | Freq: Three times a day (TID) | ORAL | Status: DC
  Administered 2015-04-11 (×2): 300 mg via ORAL
  Filled 2015-04-11 (×2): qty 1

## 2015-04-11 MED ORDER — CHOLECALCIFEROL 10 MCG (400 UNIT) PO TABS
200.0000 [IU] | ORAL_TABLET | Freq: Every day | ORAL | Status: DC
  Administered 2015-04-11: 200 [IU] via ORAL
  Filled 2015-04-11: qty 1

## 2015-04-11 MED ORDER — BUPIVACAINE-EPINEPHRINE (PF) 0.5% -1:200000 IJ SOLN
INTRAMUSCULAR | Status: AC
  Filled 2015-04-11: qty 30

## 2015-04-11 MED ORDER — BISACODYL 5 MG PO TBEC: 5.0000 mg | DELAYED_RELEASE_TABLET | Freq: Every day | ORAL | Status: DC | PRN

## 2015-04-11 MED ORDER — HYDROCODONE-ACETAMINOPHEN 5-325 MG PO TABS
1.0000 | ORAL_TABLET | ORAL | Status: DC | PRN
  Administered 2015-04-11 – 2015-04-12 (×3): 2 via ORAL
  Filled 2015-04-11 (×3): qty 2

## 2015-04-11 MED ORDER — DOCUSATE SODIUM 100 MG PO CAPS: 200.0000 mg | ORAL_CAPSULE | Freq: Two times a day (BID) | ORAL | Status: DC

## 2015-04-11 MED ORDER — METHOCARBAMOL 500 MG PO TABS: 500.0000 mg | ORAL_TABLET | Freq: Four times a day (QID) | ORAL | Status: DC | PRN

## 2015-04-11 MED ORDER — CALCIUM CITRATE 950 (200 CA) MG PO TABS
200.0000 mg | ORAL_TABLET | Freq: Every day | ORAL | Status: DC
  Administered 2015-04-11: 200 mg via ORAL
  Filled 2015-04-11 (×3): qty 1

## 2015-04-11 MED ORDER — MORPHINE SULFATE (PF) 2 MG/ML IV SOLN
1.0000 mg | INTRAVENOUS | Status: DC | PRN
  Administered 2015-04-11: 2 mg via INTRAVENOUS
  Filled 2015-04-11: qty 1

## 2015-04-11 SURGICAL SUPPLY — 51 items
ADH SKN CLS APL DERMABOND .7 (GAUZE/BANDAGES/DRESSINGS) ×1
APL SKNCLS STERI-STRIP NONHPOA (GAUZE/BANDAGES/DRESSINGS) ×1
BENZOIN TINCTURE PRP APPL 2/3 (GAUZE/BANDAGES/DRESSINGS) ×1 IMPLANT
BUPIVACAINE 0.5% WITH EPI 30ML IMPLANT
CLSR STERI-STRIP ANTIMIC 1/2X4 (GAUZE/BANDAGES/DRESSINGS) ×1 IMPLANT
DERMABOND ADVANCED (GAUZE/BANDAGES/DRESSINGS) ×1
DERMABOND ADVANCED .7 DNX12 (GAUZE/BANDAGES/DRESSINGS) IMPLANT
DRAPE INCISE IOBAN 66X45 STRL (DRAPES) IMPLANT
DRAPE SURG 17X23 STRL (DRAPES) ×8 IMPLANT
DRSG MEPILEX BORDER 4X4 (GAUZE/BANDAGES/DRESSINGS) IMPLANT
DRSG MEPILEX BORDER 4X8 (GAUZE/BANDAGES/DRESSINGS) IMPLANT
DRSG TEGADERM 4X4.75 (GAUZE/BANDAGES/DRESSINGS) ×1 IMPLANT
DURAPREP 26ML APPLICATOR (WOUND CARE) ×2 IMPLANT
ELECT CAUTERY BLADE 6.4 (BLADE) ×2 IMPLANT
ELECT REM PT RETURN 9FT ADLT (ELECTROSURGICAL) ×2
ELECTRODE REM PT RTRN 9FT ADLT (ELECTROSURGICAL) ×1 IMPLANT
GAUZE SPONGE 4X4 12PLY STRL (GAUZE/BANDAGES/DRESSINGS) ×2 IMPLANT
GLOVE BIOGEL PI IND STRL 8 (GLOVE) ×1 IMPLANT
GLOVE BIOGEL PI INDICATOR 8 (GLOVE) ×1
GLOVE ECLIPSE 9.0 STRL (GLOVE) ×2 IMPLANT
GLOVE ORTHO TXT STRL SZ7.5 (GLOVE) ×2 IMPLANT
GLOVE SURG 8.5 LATEX PF (GLOVE) ×2 IMPLANT
GOWN STRL REUS W/ TWL LRG LVL3 (GOWN DISPOSABLE) ×1 IMPLANT
GOWN STRL REUS W/TWL 2XL LVL3 (GOWN DISPOSABLE) ×4 IMPLANT
GOWN STRL REUS W/TWL LRG LVL3 (GOWN DISPOSABLE) ×2
KIT BASIN OR (CUSTOM PROCEDURE TRAY) ×2 IMPLANT
KIT ROOM TURNOVER OR (KITS) ×2 IMPLANT
MANIFOLD NEPTUNE II (INSTRUMENTS) ×2 IMPLANT
NEEDLE 22X1 1/2 (OR ONLY) (NEEDLE) ×2 IMPLANT
NS IRRIG 1000ML POUR BTL (IV SOLUTION) ×2 IMPLANT
PACK LAMINECTOMY ORTHO (CUSTOM PROCEDURE TRAY) ×2 IMPLANT
PAD ARMBOARD 7.5X6 YLW CONV (MISCELLANEOUS) ×4 IMPLANT
POSITIONER HEAD PRONE TRACH (MISCELLANEOUS) ×1 IMPLANT
SPECIMEN JAR SMALL (MISCELLANEOUS) ×2 IMPLANT
SPONGE GAUZE 4X4 12PLY STER LF (GAUZE/BANDAGES/DRESSINGS) ×1 IMPLANT
SPONGE SURGIFOAM ABS GEL 100 (HEMOSTASIS) IMPLANT
STRIP CLOSURE SKIN 1/2X4 (GAUZE/BANDAGES/DRESSINGS) IMPLANT
SUT VIC AB 0 CTX 36 (SUTURE) ×2
SUT VIC AB 0 CTX36XBRD ANTBCTR (SUTURE) IMPLANT
SUT VIC AB 1 CT1 27 (SUTURE) ×2
SUT VIC AB 1 CT1 27XBRD ANBCTR (SUTURE) ×1 IMPLANT
SUT VIC AB 2-0 CT1 27 (SUTURE) ×2
SUT VIC AB 2-0 CT1 TAPERPNT 27 (SUTURE) IMPLANT
SUT VICRYL 0 UR6 27IN ABS (SUTURE) ×2 IMPLANT
SUT VICRYL 4-0 PS2 18IN ABS (SUTURE) ×1 IMPLANT
SWABSTICK BENZOIN STERILE (MISCELLANEOUS) ×2 IMPLANT
SYR CONTROL 10ML LL (SYRINGE) ×2 IMPLANT
TOWEL OR 17X24 6PK STRL BLUE (TOWEL DISPOSABLE) ×2 IMPLANT
TOWEL OR 17X26 10 PK STRL BLUE (TOWEL DISPOSABLE) ×2 IMPLANT
WATER STERILE IRR 1000ML POUR (IV SOLUTION) ×2 IMPLANT
YANKAUER SUCT BULB TIP NO VENT (SUCTIONS) ×2 IMPLANT

## 2015-04-11 NOTE — Interval H&P Note (Signed)
The patient has been re-examined, and the chart reviewed, and there have been no interval changes to the documented history and physical.    The risks, benefits, and alternatives have been discussed at length, and the patient is willing to proceed.   

## 2015-04-11 NOTE — Anesthesia Procedure Notes (Signed)
Procedure Name: Intubation Date/Time: 04/11/2015 12:46 PM Performed by: Rebekah Chesterfield L Pre-anesthesia Checklist: Patient identified, Emergency Drugs available, Suction available and Patient being monitored Patient Re-evaluated:Patient Re-evaluated prior to inductionOxygen Delivery Method: Circle System Utilized Preoxygenation: Pre-oxygenation with 100% oxygen Intubation Type: IV induction Ventilation: Mask ventilation without difficulty Laryngoscope Size: Mac and 3 Grade View: Grade II Tube type: Oral Tube size: 7.0 mm Number of attempts: 1 Airway Equipment and Method: Stylet Placement Confirmation: ETT inserted through vocal cords under direct vision,  positive ETCO2 and breath sounds checked- equal and bilateral Secured at: 20 cm Tube secured with: Tape Dental Injury: Teeth and Oropharynx as per pre-operative assessment

## 2015-04-11 NOTE — Brief Op Note (Signed)
PATIENT ID:      LUJUANA KAPLER  MRN:     [id number] DOB/AGE:    09-21-1960 / 55 y.o.       OPERATIVE REPORT   DATE OF PROCEDURE:  04/11/2015      PREOPERATIVE DIAGNOSIS:   Coccygodynia, lordotic coccyx                                                       Body mass index is 29.85 kg/(m^2).    POSTOPERATIVE DIAGNOSIS:   Coccygodynia, lordotic coccyx                                                                     Body mass index is 29.85 kg/(m^2).    PROCEDURE:  Procedure(s): COCCYGECTOMY WITH OSTEOTOMY THROUGH AREA OF DEFORMITY    SURGEON: Arnett Duddy E   ASSISTANT: Esaw Grandchild          ANESTHESIA:  General and supplemented with local anesthesia, marcaine 0.5% 1:1 exparel total 20 cc. Dr. Roderic Palau.  EBL:<20cc  DRAINS:None.  TOURNIQUET TIME:None  COMPLICATIONS:  None   CONDITION:  Stable, extubated and in satisfactory condition.     Leeam Cedrone E 04/11/2015, 1:44 PM

## 2015-04-11 NOTE — Anesthesia Postprocedure Evaluation (Signed)
Anesthesia Post Note  Patient: [name]  Procedure(s) Performed: Procedure(s) (LRB): COCCYGECTOMY WITH OSTEOTOMY THROUGH AREA OF DEFORMITY (N/A)  Patient location during evaluation: PACU Anesthesia Type: General Level of consciousness: awake and alert Pain management: pain level controlled Vital Signs Assessment: post-procedure vital signs reviewed and stable Respiratory status: spontaneous breathing, nonlabored ventilation, respiratory function stable and patient connected to nasal cannula oxygen Cardiovascular status: blood pressure returned to baseline and stable Postop Assessment: no signs of nausea or vomiting Anesthetic complications: no    Last Vitals:  Filed Vitals:   04/11/15 1510 04/11/15 1533  BP:  133/63  Pulse:  88  Temp: 36.8 C 36.9 C  Resp:  16    Last Pain:  Filed Vitals:   04/11/15 1534  PainSc: 7                  Zenaida Deed

## 2015-04-11 NOTE — Op Note (Signed)
     04/11/2015  1:55 PM  PATIENT:  [name]  55 y.o. female  MRN: [id number]  OPERATIVE REPORT  PRE-OPERATIVE DIAGNOSIS:  Coccygodynia, lordotic coccyx  POST-OPERATIVE DIAGNOSIS:  Coccygodynia, lordotic coccyx  PROCEDURE:  Procedure(s): COCCYGECTOMY WITH OSTEOTOMY THROUGH AREA OF DEFORMITY    SURGEON:  Jessy Oto, MD     ASSISTANT:  Benjiman Core, PA-C  (Present throughout the entire procedure and necessary for completion of procedure in a timely manner)     ANESTHESIA:  General,supplement with local anesthesia marcaine 0.5% 1:1 exparel 1.3% total 20cc. Dr. Roderic Palau.    COMPLICATIONS:  None.   PROCEDURE: The patient was met in the holding area, and the appropriate coccyx identified and not marked as it is a midline structure.The patient was then placed under  general anesthesia without difficulty and was placed on the operative table in a prone position. The patient received appropriate preoperative antibiotic prophylaxis.   The surgical field buttocks and coccyx was then prepped using sterile conditions and draped using sterile technique. Time-out procedure was called and correct. Old midline incision marked and incision made in the midline extending from the Caudal tip of the residual coccyx cranially 2 inches. The incision through skin, subcutaneous layers directly down to the boney coccyx. The periosteum of the posterior coccyx then incised in the midline and subperiosteol dissection carried to both Sides of the coccyx. A ruler used to measure from the most distal tip of the coccyx cranially 2.5 cm and at this level a hand  Rongeur used to perform a partial osteotomy of the saccrococcygeal level transversely and this was deepened. The osteotomy then completed by applying direct pressure over the distal portion of the fused coccyx. A periosteal elevator Valora Corporal And Key used to bluntly dissect soft tissue off the ventral surface of the distal coccygeal segment. A  bovie electrocautery then used to drebride the remaining soft tissues off the distal portion of the coccyx circumferentially, care taken to remain on the coccyxgeal bone. The coccyx segments were then removed. The incision irrigated with copious amounts of irrigant solution. The distal portions of the osteotomy site then smoothed with a bear rongeur beveling the bone inward with rounding Of the distal end of the osteotomy site. Further irrigation was carried out. There was no active bleeding. The periosteom of the coccyx was then approximated in the midline with O-Vicryl interrupted sutures. The subcutaneous layers approximated with 2-O Vicryl sutures and the skin closed with a running suture of 4-O vicryl. Dermabond was then applied. A dry dressing of 2x2 and tegaderm fixed to the skin with tincture of benzoin.  All instruments and sponge count was count was correct.   NITKA,JAMES E  04/11/2015, 1:55 PM

## 2015-04-11 NOTE — Discharge Instructions (Addendum)
° ° °  No lifting greater than 10 lbs. Avoid bending, stooping and twisting. Walk in house for first week them may start to get out slowly increasing distance up to one quarter mile by 3 weeks post op. Keep incision dry for 4 days, may use tegaderm or similar water impervious dressing. Use donut when sitting, avoid deep squats, lean to side when going from sitting to standing.

## 2015-04-11 NOTE — Progress Notes (Signed)
Awake, alert and oriented x 4 in the recovery room. Order placed for an inflateable donut. Will be ready  For discharge in AM. I will see prior to discharge. Rxs are printed.

## 2015-04-11 NOTE — Progress Notes (Signed)
Orthopedic Tech Progress Note Patient Details:  [name] 18-Apr-1960 [id number]  Patient ID: [name], female   DOB: Aug 29, 1960, 55 y.o.   MRN: [id number] SPD has the inflatable doughtnut and the RN has been notified  Hildred Priest 04/11/2015, 3:08 PM

## 2015-04-11 NOTE — H&P (Signed)
[name] is an 55 y.o. female.   Chief Complaint: tailbone pain HPI:  Patient coccyx deformity presents with the above complaint.  Ongoing pain with failed conservative treatment.    Past Medical History  Diagnosis Date  . HTN (hypertension)   . HLD (hyperlipidemia)   . Osteoarthrosis involving more than one site but not generalized   . Depression   . GERD (gastroesophageal reflux disease)   . Chronic abdominal pain   . Chronic joint pain   . Chronic back pain   . Cancer ([place]) 1997    left lung  . Palpitations 01/2013    chronic, intermittent  . Nausea and vomiting     chronic, recurrent  . Asthma     every now and then.  Wheezing and coughing  . Shortness of breath dyspnea     with exertion    Past Surgical History  Procedure Laterality Date  . Tubal ligation    . Abdominal hysterectomy    . Abdominal surgery      ovarian cyst removal  . Lung cancer surgery  1997    age 35, resection only  . Bladder surgery  suspension x4    x 4  . Lobectomy Left   . Excision vaginal cyst Left 06/20/2012    Procedure: EXCISION LEFT LABIAL CYST;  Surgeon: Jonnie Kind, MD;  Location: [facility];  Service: Gynecology;  Laterality: Left;  . Colonoscopy with propofol N/A 11/22/2013    Dr. Gala Romney: Grade 3 and 4/internal hemorrhoids?"likely source of hematochezia Normal colonoscopy otherwise.   . Esophagogastroduodenoscopy (egd) with propofol N/A 11/22/2013    Dr. Gala Romney: Incomplete Schatzki's ring dilated and disrupted as described above.  Small hiatal hernia. Focally abnormal gastric mucosa of uncertain significance status post biopsy, reactive gastropathy, negative H.pylori  . Maloney dilation N/A 11/22/2013    Procedure: [name] DILATION;  Surgeon: Daneil Dolin, MD;  Location: [facility];  Service: Endoscopy;  Laterality: N/A;  56  . Biopsy N/A 11/22/2013    Procedure: GASTRIC BIOPSY;  Surgeon: Daneil Dolin, MD;  Location: [facility];  Service: Endoscopy;  Laterality: N/A;  . Colonoscopy    .  Cholecystectomy N/A 04/05/2014    Procedure: LAPAROSCOPIC CHOLECYSTECTOMY;  Surgeon: Aviva Signs Md, MD;  Location: [facility];  Service: General;  Laterality: N/A;  . Mass excision N/A 07/23/2014    Procedure: EXCISIONAL BIOPSY NODULE LEFT PARACOCCYGEAL AREA;  Surgeon: Jessy Oto, MD;  Location: [facility];  Service: Orthopedics;  Laterality: N/A;    Family History  Problem Relation Age of Onset  . Hypertension Mother   . Kidney disease Mother   . Hypertension Father   . Kidney disease Father   . Heart disease Father   . Cancer Father     leukemia  . Hypertension Sister   . Hypertension Sister   . Diabetes Other   . Heart disease Other     has Psychologist, forensic  . Colon cancer Neg Hx    Social History:  reports that she quit smoking about 20 years ago. Her smoking use included Cigarettes. She has a 37.5 pack-year smoking history. She has never used smokeless tobacco. She reports that she does not drink alcohol or use illicit drugs.  Allergies:  Allergies  Allergen Reactions  . Promethazine Hcl Nausea And Vomiting and Other (See Comments)    "Makes my head hurt really bad too"    Medications Prior to Admission  Medication Sig Dispense Refill  . acyclovir (ZOVIRAX)  400 MG tablet TAKE ONE TABLET BY MOUTH TWICE DAILY 60 tablet 5  . albuterol (PROAIR HFA) 108 (90 BASE) MCG/ACT inhaler Inhale 2 puffs into the lungs every 4 (four) hours as needed. for shortness of breath 1 each 2  . AMITIZA 24 MCG capsule TAKE ONE CAPSULE BY MOUTH TWICE DAILY WITH MEALS 60 capsule 5  . amitriptyline (ELAVIL) 150 MG tablet TAKE ONE TABLET BY MOUTH ONCE DAILY WITH BREAKFAST (Patient taking differently: TAKE ONE TABLET hs) 30 tablet 2  . atorvastatin (LIPITOR) 40 MG tablet Take 1 tablet (40 mg total) by mouth daily. 30 tablet 5  . Calcium Citrate-Vitamin D 250-200 MG-UNIT TABS Take 1 tablet by mouth daily.     Marland Kitchen DEXILANT 60 MG capsule TAKE ONE CAPSULE BY MOUTH ONCE DAILY 30 capsule 11  . DULoxetine (CYMBALTA) 60 MG  capsule Take 1 capsule (60 mg total) by mouth 2 (two) times daily. 120 capsule 11  . gabapentin (NEURONTIN) 300 MG capsule TAKE ONE CAPSULE BY MOUTH THREE TIMES DAILY 90 capsule 3  . hydrochlorothiazide (HYDRODIURIL) 25 MG tablet Take 1 tablet (25 mg total) by mouth daily. 90 tablet 2  . meloxicam (MOBIC) 15 MG tablet Take 1 tablet (15 mg total) by mouth daily. 30 tablet 2  . methocarbamol (ROBAXIN) 500 MG tablet Take 1 tablet (500 mg total) by mouth every 6 (six) hours as needed for muscle spasms. 40 tablet 1  . oxyCODONE-acetaminophen (ROXICET) 5-325 MG tablet Take 2 tablets by mouth every 4 (four) hours as needed for severe pain. 60 tablet 0    No results found for this or any previous visit (from the past 48 hour(s)). No results found.  Review of Systems  Constitutional: Negative.   HENT: Negative.   Eyes: Negative.   Respiratory: Negative.   Cardiovascular: Negative.   Gastrointestinal: Negative.   Genitourinary: Negative.   Musculoskeletal: Positive for back pain.  Skin: Negative.   Neurological: Negative.   Psychiatric/Behavioral: Negative.     There were no vitals taken for this visit. Physical Exam  Constitutional: She is oriented to person, place, and time. No distress.  HENT:  Head: Atraumatic.  Eyes: EOM are normal.  Neck: Normal range of motion.  Cardiovascular: Normal rate.   Respiratory: No respiratory distress.  GI: She exhibits no distension.  Musculoskeletal: She exhibits tenderness.  Neurological: She is alert and oriented to person, place, and time.  Skin: Skin is warm and dry.  Psychiatric: She has a normal mood and affect.     Assessment/Plan Coccygeal pain. Will proceed with COCCYGECTOMY WITH OSTEOTOMY THROUGH AREA OF DEFORMITY as scheduled.  Surgical procedure along with possible risks and complications discussed in great detail with Dr Louanne Skye. All questions were answered and she wishes to proceed.   Lanae Crumbly, PA-C 04/11/2015, 9:42  AM   Patient examined and lab reviewed with Ricard Dillon, PA-C.

## 2015-04-11 NOTE — Anesthesia Preprocedure Evaluation (Addendum)
Anesthesia Evaluation  Patient identified by MRN, date of birth, ID band Patient awake    Reviewed: Allergy & Precautions, NPO status , Patient's Chart, lab work & pertinent test results  Airway Mallampati: I  TM Distance: >3 FB Neck ROM: Full    Dental   Pulmonary sleep apnea , former smoker,    Pulmonary exam normal        Cardiovascular hypertension, Pt. on medications Normal cardiovascular exam     Neuro/Psych Depression    GI/Hepatic Neg liver ROS, GERD  Medicated and Controlled,  Endo/Other  negative endocrine ROS  Renal/GU negative Renal ROS     Musculoskeletal  (+) Arthritis ,   Abdominal   Peds  Hematology negative hematology ROS (+)   Anesthesia Other Findings   Reproductive/Obstetrics                            Lab Results  Component Value Date   WBC 5.3 04/09/2015   HGB 12.5 04/09/2015   HCT 37.4 04/09/2015   MCV 91.7 04/09/2015   PLT 258 04/09/2015   Lab Results  Component Value Date   CREATININE 0.83 04/09/2015   BUN <5* 04/09/2015   NA 141 04/09/2015   K 3.0* 04/09/2015   CL 98* 04/09/2015   CO2 29 04/09/2015    Anesthesia Physical  Anesthesia Plan  ASA: II  Anesthesia Plan: General   Post-op Pain Management:    Induction: Intravenous  Airway Management Planned: Oral ETT  Additional Equipment:   Intra-op Plan:   Post-operative Plan: Extubation in OR  Informed Consent: I have reviewed the patients History and Physical, chart, labs and discussed the procedure including the risks, benefits and alternatives for the proposed anesthesia with the patient or authorized representative who has indicated his/her understanding and acceptance.     Plan Discussed with: CRNA and Surgeon  Anesthesia Plan Comments:         Anesthesia Quick Evaluation

## 2015-04-11 NOTE — Evaluation (Signed)
Occupational Therapy Evaluation Patient Details Name: [name] MRN: [id number] DOB: 08-08-1960 Today's Date: 04/11/2015    History of Present Illness Pt is a 55 y.o. female s/p COCCYGECTOMY WITH OSTEOTOMY THROUGH AREA OF DEFORMITY. PMHx: HTN, HLD, Osteoarthrosis, Depression, GERD.    Clinical Impression   Pt reports she was mod I with ADLs and independent with mobility PTA. Currently pt overall supervision for safety with ADLs and functional mobility. All education complete; pt with no further questions or concerns for OT at this time. Pt planning to d/c home with 24/7 supervision from family. No further acute OT needs identified; signing off at this time. Please re-consult if needs change. Thank you for this referral.     Follow Up Recommendations  No OT follow up;Supervision - Intermittent    Equipment Recommendations  None recommended by OT    Recommendations for Other Services       Precautions / Restrictions Precautions Precautions: Fall Required Braces or Orthoses: Other Brace/Splint Other Brace/Splint: Inflateable donut Restrictions Weight Bearing Restrictions: No      Mobility Bed Mobility Overal bed mobility: Modified Independent                Transfers Overall transfer level: Modified independent Equipment used: None             General transfer comment: No noted LOB or unsteadyness on feet. Moves slowly with functional mobility.    Balance Overall balance assessment: No apparent balance deficits (not formally assessed)                                          ADL Overall ADL's : Needs assistance/impaired Eating/Feeding: Set up;Sitting   Grooming: Supervision/safety;Standing   Upper Body Bathing: Supervision/ safety;Standing   Lower Body Bathing: Supervison/ safety;Sit to/from stand;With adaptive equipment   Upper Body Dressing : Set up;Sitting   Lower Body Dressing: Supervision/safety;Sit to/from stand;With  adaptive equipment   Toilet Transfer: Supervision/safety;Ambulation;BSC (BSC over toilet)   Toileting- Clothing Manipulation and Hygiene: Supervision/safety;Sit to/from stand   Tub/ Shower Transfer: Supervision/safety;Ambulation;Tub transfer Tub/Shower Transfer Details (indicate cue type and reason): Supervision for safety. Pt able to demo safe tub transfer. Functional mobility during ADLs: Supervision/safety General ADL Comments: Eduacted pt on home safety, need for supervision for tub transfer initially. Pt with no further questions or concerns for OT at this time.     Vision Vision Assessment?: No apparent visual deficits   Perception     Praxis      Pertinent Vitals/Pain Pain Assessment: Faces Faces Pain Scale: Hurts little more Pain Location: buttocks when sitting Pain Descriptors / Indicators: Grimacing Pain Intervention(s): Monitored during session;Repositioned;Premedicated before session     Hand Dominance Right   Extremity/Trunk Assessment Upper Extremity Assessment Upper Extremity Assessment: Overall WFL for tasks assessed   Lower Extremity Assessment Lower Extremity Assessment: Defer to PT evaluation   Cervical / Trunk Assessment Cervical / Trunk Assessment: Normal   Communication Communication Communication: No difficulties   Cognition Arousal/Alertness: Awake/alert Behavior During Therapy: WFL for tasks assessed/performed Overall Cognitive Status: Within Functional Limits for tasks assessed                     General Comments       Exercises       Shoulder Instructions      Home Living Family/patient expects to be discharged to:: Private residence Living  Arrangements: Spouse/significant other Available Help at Discharge: Family;Available 24 hours/day Type of Home: House Home Access: Stairs to enter [employer] of Steps: 3   Home Layout: One level     Bathroom Shower/Tub: Tub/shower unit Shower/tub characteristics:  Curtain Biochemist, clinical: Standard     Home Equipment: Environmental consultant - 2 wheels;Bedside commode;Adaptive equipment Adaptive Equipment: Reacher;Sock aid;Long-handled shoe horn;Long-handled sponge        Prior Functioning/Environment Level of Independence: Independent        Comments: AE for LB ADLs    OT Diagnosis: Acute pain   OT Problem List:     OT Treatment/Interventions:      OT Goals(Current goals can be found in the care plan section) Acute Rehab OT Goals OT Goal Formulation: All assessment and education complete, DC therapy  OT Frequency:     Barriers to D/C:            Co-evaluation PT/OT/SLP Co-Evaluation/Treatment: Yes Reason for Co-Treatment: For patient/therapist safety   OT goals addressed during session: ADL's and self-care;Other (comment) (mobility)      End of Session Equipment Utilized During Treatment: Gait belt;Back brace Nurse Communication: Mobility status  Activity Tolerance: Patient tolerated treatment well Patient left: in bed;with call bell/phone within reach;with family/visitor present   Time: 0973-5329 OT Time Calculation (min): 24 min Charges:  OT General Charges $OT Visit: 1 Procedure OT Evaluation $OT Eval Low Complexity: 1 Procedure G-Codes: OT G-codes **NOT FOR INPATIENT CLASS** Functional Assessment Tool Used: Clinical judgement Functional Limitation: Self care Self Care Current Status (J2426): At least 1 percent but less than 20 percent impaired, limited or restricted Self Care Goal Status (S3419): At least 1 percent but less than 20 percent impaired, limited or restricted Self Care Discharge Status [phone]): At least 1 percent but less than 20 percent impaired, limited or restricted   Binnie Kand M.S., OTR/L Pager: [phone]  04/11/2015, 5:01 PM

## 2015-04-11 NOTE — Evaluation (Signed)
Physical Therapy Evaluation and Discharge Patient Details Name: [name] MRN: [id number] DOB: [birth year] Today's Date: 04/11/2015   History of Present Illness  Pt is a 55 y.o. female s/p COCCYGECTOMY WITH OSTEOTOMY THROUGH AREA OF DEFORMITY. PMHx: HTN, HLD, Osteoarthrosis, Depression, GERD.   Clinical Impression  Patient seen for evaluation, mobilizing well. Education provided, patient receptive. Performed stairs, simulated car transfer and ambulation without issue. No further acute PT needs, will sign off.    Follow Up Recommendations No PT follow up    Equipment Recommendations  None recommended by PT    Recommendations for Other Services       Precautions / Restrictions Precautions Precautions: Fall Required Braces or Orthoses: Other Brace/Splint Other Brace/Splint: Inflateable donut Restrictions Weight Bearing Restrictions: No      Mobility  Bed Mobility Overal bed mobility: Modified Independent                Transfers Overall transfer level: Modified independent Equipment used: None             General transfer comment: No noted LOB or unsteadyness on feet. Moves slowly with functional mobility.  Ambulation/Gait Ambulation/Gait assistance: Modified independent (Device/Increase time) Ambulation Distance (Feet): 240 Feet Assistive device: None Gait Pattern/deviations: Decreased stride length;Step-through pattern;Drifts right/left Gait velocity: decreased Gait velocity interpretation: Below normal speed for age/gender General Gait Details: some modest instability noted and increased time to ambulate but no physical assist required,  Stairs Stairs: Yes Stairs assistance: Modified independent (Device/Increase time) Stair Management: One rail Right;Step to pattern;Forwards Number of Stairs: 5 General stair comments: no physical assist required  Wheelchair Mobility    Modified Rankin (Stroke Patients Only)       Balance Overall balance  assessment: No apparent balance deficits (not formally assessed)                                           Pertinent Vitals/Pain Pain Assessment: Faces Faces Pain Scale: Hurts little more Pain Location: buttocks when sitting Pain Descriptors / Indicators: Grimacing Pain Intervention(s): Monitored during session;Repositioned;Premedicated before session    Home Living Family/patient expects to be discharged to:: Private residence Living Arrangements: Spouse/significant other Available Help at Discharge: Family;Available 24 hours/day Type of Home: House Home Access: Stairs to enter   [employer] of Steps: 3 Home Layout: One level Home Equipment: Walker - 2 wheels;Bedside commode;Adaptive equipment      Prior Function Level of Independence: Independent         Comments: AE for LB ADLs     Hand Dominance   Dominant Hand: Right    Extremity/Trunk Assessment   Upper Extremity Assessment: Overall WFL for tasks assessed           Lower Extremity Assessment: Defer to PT evaluation      Cervical / Trunk Assessment: Normal  Communication   Communication: No difficulties  Cognition Arousal/Alertness: Awake/alert Behavior During Therapy: WFL for tasks assessed/performed Overall Cognitive Status: Within Functional Limits for tasks assessed                      General Comments General comments (skin integrity, edema, etc.): educated on car transfers, stair negotiation, and use of donut pillow    Exercises        Assessment/Plan    PT Assessment Patent does not need any further PT services  PT Diagnosis Acute pain;Abnormality  of gait;Difficulty walking   PT Problem List    PT Treatment Interventions     PT Goals (Current goals can be found in the Care Plan section) Acute Rehab PT Goals PT Goal Formulation: All assessment and education complete, DC therapy    Frequency     Barriers to discharge        Co-evaluation  PT/OT/SLP Co-Evaluation/Treatment: Yes (DOVE TAILED SESSION) Reason for Co-Treatment: For patient/therapist safety PT goals addressed during session: Mobility/safety with mobility OT goals addressed during session: ADL's and self-care;Other (comment) (mobility)       End of Session Equipment Utilized During Treatment: Gait belt;Back brace Activity Tolerance: Patient tolerated treatment well Patient left: in bed;with call bell/phone within reach;with family/visitor present Nurse Communication: Mobility status;Precautions    Functional Assessment Tool Used: clincal judgement Functional Limitation: Mobility: Walking and moving around Mobility: Walking and Moving Around Current Status (K0881): At least 1 percent but less than 20 percent impaired, limited or restricted Mobility: Walking and Moving Around Goal Status [phone]): At least 1 percent but less than 20 percent impaired, limited or restricted Mobility: Walking and Moving Around Discharge Status [phone]): At least 1 percent but less than 20 percent impaired, limited or restricted    Time: 9292-4462 PT Time Calculation (min) (ACUTE ONLY): 24 min   Charges:   PT Evaluation $PT Eval Low Complexity: 1 Procedure     PT G Codes:   PT G-Codes **NOT FOR INPATIENT CLASS** Functional Assessment Tool Used: clincal judgement Functional Limitation: Mobility: Walking and moving around Mobility: Walking and Moving Around Current Status (M6381): At least 1 percent but less than 20 percent impaired, limited or restricted Mobility: Walking and Moving Around Goal Status [phone]): At least 1 percent but less than 20 percent impaired, limited or restricted Mobility: Walking and Moving Around Discharge Status [phone]): At least 1 percent but less than 20 percent impaired, limited or restricted    Duncan Dull 04/11/2015, 5:05 PM Alben Deeds, Gray DPT  [phone]

## 2015-04-11 NOTE — Transfer of Care (Signed)
Immediate Anesthesia Transfer of Care Note  Patient: [name]  Procedure(s) Performed: Procedure(s): COCCYGECTOMY WITH OSTEOTOMY THROUGH AREA OF DEFORMITY (N/A)  Patient Location: PACU  Anesthesia Type:General  Level of Consciousness: awake, alert , oriented and patient cooperative  Airway & Oxygen Therapy: Patient Spontanous Breathing and Patient connected to nasal cannula oxygen  Post-op Assessment: Report given to RN, Post -op Vital signs reviewed and stable and Patient moving all extremities X 4  Post vital signs: Reviewed and stable  Last Vitals:  Filed Vitals:   04/11/15 0954 04/11/15 1409  BP: 154/55   Pulse: 85   Temp: 36.7 C 36.9 C  Resp: 20     Complications: No apparent anesthesia complications

## 2015-04-12 DIAGNOSIS — I10 Essential (primary) hypertension: Secondary | ICD-10-CM | POA: Diagnosis not present

## 2015-04-12 DIAGNOSIS — M533 Sacrococcygeal disorders, not elsewhere classified: Secondary | ICD-10-CM | POA: Diagnosis not present

## 2015-04-12 DIAGNOSIS — Z85118 Personal history of other malignant neoplasm of bronchus and lung: Secondary | ICD-10-CM | POA: Diagnosis not present

## 2015-04-12 DIAGNOSIS — E785 Hyperlipidemia, unspecified: Secondary | ICD-10-CM | POA: Diagnosis not present

## 2015-04-12 DIAGNOSIS — K219 Gastro-esophageal reflux disease without esophagitis: Secondary | ICD-10-CM | POA: Diagnosis not present

## 2015-04-12 DIAGNOSIS — Z87891 Personal history of nicotine dependence: Secondary | ICD-10-CM | POA: Diagnosis not present

## 2015-04-12 NOTE — Progress Notes (Signed)
     Subjective: 1 Day Post-Op Procedure(s) (LRB): COCCYGECTOMY WITH OSTEOTOMY THROUGH AREA OF DEFORMITY (N/A) Awake, alert and oriented x 4. Did well with OT and PT. Pain is well controlled, voiding without difficulty. Donut is in room. Patient reports pain as mild.    Objective:   VITALS:  Temp:  [97.9 F (36.6 C)-98.6 F (37 C)] 98.6 F (37 C) (03/25 0625) Pulse Rate:  [84-95] 84 (03/25 0625) Resp:  [14-27] 17 (03/25 0625) BP: (117-167)/(46-73) 155/46 mmHg (03/25 0625) SpO2:  [90 %-100 %] 100 % (03/25 0625) Weight:  [174 lb (78.926 kg)] 174 lb (78.926 kg) (03/24 0954)  Neurologically intact ABD soft Neurovascular intact Sensation intact distally Intact pulses distally Dorsiflexion/Plantar flexion intact Incision: dressing C/D/I   LABS No results for input(s): HGB, WBC, PLT in the last 72 hours. No results for input(s): NA, K, CL, CO2, BUN, CREATININE, GLUCOSE in the last 72 hours. No results for input(s): LABPT, INR in the last 72 hours.   Assessment/Plan: 1 Day Post-Op Procedure(s) (LRB): COCCYGECTOMY WITH OSTEOTOMY THROUGH AREA OF DEFORMITY (N/A)  Advance diet Up with therapy Discharge home with home health today.  Cadience Bradfield E 04/12/2015, 9:05 AM

## 2015-04-12 NOTE — Care Management Note (Signed)
Case Management Note  Patient Details  Name: [name] MRN: [id number] Date of Birth: July 25, 1960  Subjective/Objective:  55 yo F s/p Coccygectomy with osteotomy through area of deformity         Action/Plan: received referral to assist with [facility] or DME if needed   Expected Discharge Date:     04/12/15             Expected Discharge Plan:  Home/Self Care  In-House Referral:     Discharge planning Services  CM Consult  Post Acute Care Choice:    Choice offered to:     DME Arranged:    DME Agency:     HH Arranged:    [place] Agency:     Status of Service:  Completed, signed off  Medicare Important Message Given:    Date Medicare IM Given:    Medicare IM give by:    Date Additional Medicare IM Given:    Additional Medicare Important Message give by:     If discussed at [place] of Stay Meetings, dates discussed:    Additional Comments: met with pt and sister at bedside. She plans to return home with the support of her sister and daughter. PT is not recommending any HH services or DME. She denies any d/c needs.  Norina Buzzard, RN 04/12/2015, 1:27 PM

## 2015-04-14 ENCOUNTER — Encounter (HOSPITAL_COMMUNITY): Payer: Self-pay | Admitting: Specialist

## 2015-04-15 NOTE — Discharge Summary (Signed)
Physician Discharge Summary      Patient ID: [name] MRN: [id number] DOB/AGE: [birth year] 55 y.o.  Admit date: 04/11/2015 Discharge date: 04/12/2015  Admission Diagnoses:  Active Problems:   Coccygodynia   Discharge Diagnoses:  Same  Past Medical History  Diagnosis Date  . HTN (hypertension)   . HLD (hyperlipidemia)   . Osteoarthrosis involving more than one site but not generalized   . Depression   . GERD (gastroesophageal reflux disease)   . Chronic abdominal pain   . Chronic joint pain   . Chronic back pain   . Cancer ([place]) 1997    left lung  . Palpitations 01/2013    chronic, intermittent  . Nausea and vomiting     chronic, recurrent  . Asthma     every now and then.  Wheezing and coughing  . Shortness of breath dyspnea     with exertion    Surgeries: Procedure(s): COCCYGECTOMY WITH OSTEOTOMY THROUGH AREA OF DEFORMITY on 04/11/2015   Consultants:    Discharged Condition: Improved  Hospital Course: [name] is an 55 y.o. female who was admitted 04/11/2015 with a chief complaint of No chief complaint on file. , and found to have a diagnosis of <principal problem not specified>.  She was brought to the operating room on 04/11/2015 and underwent the above named procedures.    She was given perioperative antibiotics:  Anti-infectives    Start     Dose/Rate Route Frequency Ordered Stop   04/11/15 2000  acyclovir (ZOVIRAX) tablet 400 mg  Status:  Discontinued     400 mg Oral 2 times daily 04/11/15 1530 04/12/15 1324   04/11/15 2000  ceFAZolin (ANCEF) IVPB 1 g/50 mL premix     1 g 100 mL/hr over 30 Minutes Intravenous Every 8 hours 04/11/15 1530 04/12/15 0607   04/11/15 1215  ceFAZolin (ANCEF) IVPB 2g/100 mL premix     2 g over 30 Minutes Intravenous On call to O.R. 04/11/15 1014 04/11/15 1305   04/11/15 0955  ceFAZolin (ANCEF) 2-4 GM/100ML-% IVPB    Comments:  Sammuel Cooper   : cabinet override      04/11/15 0955 04/11/15 2159    Recovered in the  PACU without difficulty then transferred to [facility] Bed 5. She did well requiring very little narcotic medications. PT and OT saw this patient on day of her surgery and also on POD #1. She was able to  complete inpatient goals of PT and OT. VVS and her incision dry without erythrema or drainage on POD#1. She tolerated  oral narcotic medication for pain and nourishment. She was discharged home on POD#1. A donut for sitting purposes to off load the surgery site provided.  She was given sequential compression devices and early ambulation for DVT prophylaxis.  She benefited maximally from their hospital stay and there were no complications.    Recent vital signs:  Filed Vitals:   04/11/15 1952 04/12/15 0625  BP: 117/69 155/46  Pulse: 84 84  Temp: 97.9 F (36.6 C) 98.6 F (37 C)  Resp: 18 17    Recent laboratory studies:  Results for orders placed or performed during the hospital encounter of 04/09/15  CBC  Result Value Ref Range   WBC 5.3 4.0 - 10.5 K/uL   RBC 4.08 3.87 - 5.11 MIL/uL   Hemoglobin 12.5 12.0 - 15.0 g/dL   HCT 37.4 36.0 - 46.0 %   MCV 91.7 78.0 - 100.0 fL   MCH 30.6  26.0 - 34.0 pg   MCHC 33.4 30.0 - 36.0 g/dL   RDW 13.3 11.5 - 15.5 %   Platelets 258 150 - 400 K/uL  Comprehensive metabolic panel  Result Value Ref Range   Sodium 141 135 - 145 mmol/L   Potassium 3.0 (L) 3.5 - 5.1 mmol/L   Chloride 98 (L) 101 - 111 mmol/L   CO2 29 22 - 32 mmol/L   Glucose, Bld 112 (H) 65 - 99 mg/dL   BUN <5 (L) 6 - 20 mg/dL   Creatinine, Ser 0.83 0.44 - 1.00 mg/dL   Calcium 9.1 8.9 - 10.3 mg/dL   Total Protein 7.0 6.5 - 8.1 g/dL   Albumin 3.6 3.5 - 5.0 g/dL   AST 17 15 - 41 U/L   ALT 18 14 - 54 U/L   Alkaline Phosphatase 153 (H) 38 - 126 U/L   Total Bilirubin 0.6 0.3 - 1.2 mg/dL   GFR calc non Af Amer >60 >60 mL/min   GFR calc Af Amer >60 >60 mL/min   Anion gap 14 5 - 15    Discharge Medications:     Medication List    TAKE these medications        acyclovir 400  MG tablet  Commonly known as:  ZOVIRAX  TAKE ONE TABLET BY MOUTH TWICE DAILY     albuterol 108 (90 Base) MCG/ACT inhaler  Commonly known as:  PROAIR HFA  Inhale 2 puffs into the lungs every 4 (four) hours as needed. for shortness of breath     AMITIZA 24 MCG capsule  Generic drug:  lubiprostone  TAKE ONE CAPSULE BY MOUTH TWICE DAILY WITH MEALS     amitriptyline 150 MG tablet  Commonly known as:  ELAVIL  TAKE ONE TABLET BY MOUTH ONCE DAILY WITH BREAKFAST     atorvastatin 40 MG tablet  Commonly known as:  LIPITOR  Take 1 tablet (40 mg total) by mouth daily.     Calcium Citrate-Vitamin D 250-200 MG-UNIT Tabs  Take 1 tablet by mouth daily.     DEXILANT 60 MG capsule  Generic drug:  dexlansoprazole  TAKE ONE CAPSULE BY MOUTH ONCE DAILY     docusate sodium 100 MG capsule  Commonly known as:  COLACE  Take 2 capsules (200 mg total) by mouth 2 (two) times daily.     DULoxetine 60 MG capsule  Commonly known as:  CYMBALTA  Take 1 capsule (60 mg total) by mouth 2 (two) times daily.     gabapentin 300 MG capsule  Commonly known as:  NEURONTIN  TAKE ONE CAPSULE BY MOUTH THREE TIMES DAILY     hydrochlorothiazide 25 MG tablet  Commonly known as:  HYDRODIURIL  Take 1 tablet (25 mg total) by mouth daily.     meloxicam 15 MG tablet  Commonly known as:  MOBIC  Take 1 tablet (15 mg total) by mouth daily.     methocarbamol 500 MG tablet  Commonly known as:  ROBAXIN  Take 1 tablet (500 mg total) by mouth every 8 (eight) hours as needed for muscle spasms.     oxyCODONE-acetaminophen 5-325 MG tablet  Commonly known as:  ROXICET  Take 1-2 tablets by mouth every 6 (six) hours as needed for moderate pain or severe pain.        Diagnostic Studies: No results found.  Disposition: 01-Home or Self Care      Discharge Instructions    Call MD / Call 911    Complete by:  As directed   If you experience chest pain or shortness of breath, CALL 911 and be transported to the hospital  emergency room.  If you develope a fever above 101 F, pus (white drainage) or increased drainage or redness at the wound, or calf pain, call your surgeon's office.     Constipation Prevention    Complete by:  As directed   Drink plenty of fluids.  Prune juice may be helpful.  You may use a stool softener, such as Colace (over the counter) 100 mg twice a day.  Use MiraLax (over the counter) for constipation as needed.     Diet - low sodium heart healthy    Complete by:  As directed      Discharge instructions    Complete by:  As directed   No lifting greater than 10 lbs. Avoid bending, stooping and twisting. Walk in house for first week them may start to get out slowly increasing distance up to one quarter mile by 3 weeks post op. Keep incision dry for 4 days, may use tegaderm or similar water impervious dressing.     Driving restrictions    Complete by:  As directed   No driving for 4 weeks     Increase activity slowly as tolerated    Complete by:  As directed      Lifting restrictions    Complete by:  As directed   No lifting for 8 weeks           Follow-up Information    Follow up with Chance Karam E, MD In 2 weeks.   Specialty:  Orthopedic Surgery   Why:  For wound re-check   Contact information:   [place] [address] [phone]        Signed: Jessy Oto 04/15/2015, 3:54 PM

## 2015-04-30 DIAGNOSIS — M4317 Spondylolisthesis, lumbosacral region: Secondary | ICD-10-CM | POA: Diagnosis not present

## 2015-04-30 DIAGNOSIS — M533 Sacrococcygeal disorders, not elsewhere classified: Secondary | ICD-10-CM | POA: Diagnosis not present

## 2015-05-26 ENCOUNTER — Other Ambulatory Visit: Payer: Self-pay | Admitting: Family Medicine

## 2015-05-26 DIAGNOSIS — G4700 Insomnia, unspecified: Secondary | ICD-10-CM

## 2015-05-26 DIAGNOSIS — M48062 Spinal stenosis, lumbar region with neurogenic claudication: Secondary | ICD-10-CM

## 2015-05-29 DIAGNOSIS — G5601 Carpal tunnel syndrome, right upper limb: Secondary | ICD-10-CM | POA: Diagnosis not present

## 2015-05-29 DIAGNOSIS — G5602 Carpal tunnel syndrome, left upper limb: Secondary | ICD-10-CM | POA: Diagnosis not present

## 2015-05-29 DIAGNOSIS — M542 Cervicalgia: Secondary | ICD-10-CM | POA: Diagnosis not present

## 2015-06-06 DIAGNOSIS — R202 Paresthesia of skin: Secondary | ICD-10-CM | POA: Diagnosis not present

## 2015-06-07 ENCOUNTER — Other Ambulatory Visit: Payer: Self-pay | Admitting: Family Medicine

## 2015-06-07 DIAGNOSIS — M48062 Spinal stenosis, lumbar region with neurogenic claudication: Secondary | ICD-10-CM

## 2015-06-09 ENCOUNTER — Other Ambulatory Visit: Payer: Self-pay

## 2015-06-09 DIAGNOSIS — R748 Abnormal levels of other serum enzymes: Secondary | ICD-10-CM

## 2015-06-22 ENCOUNTER — Other Ambulatory Visit: Payer: Self-pay | Admitting: Gastroenterology

## 2015-07-02 DIAGNOSIS — R748 Abnormal levels of other serum enzymes: Secondary | ICD-10-CM | POA: Diagnosis not present

## 2015-07-03 LAB — HEPATIC FUNCTION PANEL
ALT: 13 U/L (ref 6–29)
AST: 15 U/L (ref 10–35)
Albumin: 4 g/dL (ref 3.6–5.1)
Alkaline Phosphatase: 150 U/L — ABNORMAL HIGH (ref 33–130)
BILIRUBIN DIRECT: 0.1 mg/dL (ref <=0.2)
BILIRUBIN INDIRECT: 0.3 mg/dL (ref 0.2–1.2)
BILIRUBIN TOTAL: 0.4 mg/dL (ref 0.2–1.2)
Total Protein: 7 g/dL (ref 6.1–8.1)

## 2015-07-04 DIAGNOSIS — G5601 Carpal tunnel syndrome, right upper limb: Secondary | ICD-10-CM | POA: Diagnosis not present

## 2015-07-06 NOTE — Progress Notes (Signed)
Quick Note:  Isolated elevation of alk phos, chronic. Let's just check this yearly. ______

## 2015-07-07 ENCOUNTER — Encounter (HOSPITAL_BASED_OUTPATIENT_CLINIC_OR_DEPARTMENT_OTHER): Payer: Self-pay | Admitting: *Deleted

## 2015-07-08 ENCOUNTER — Other Ambulatory Visit: Payer: Self-pay | Admitting: Gastroenterology

## 2015-07-08 DIAGNOSIS — R748 Abnormal levels of other serum enzymes: Secondary | ICD-10-CM

## 2015-07-09 ENCOUNTER — Encounter (HOSPITAL_BASED_OUTPATIENT_CLINIC_OR_DEPARTMENT_OTHER)
Admission: RE | Admit: 2015-07-09 | Discharge: 2015-07-09 | Disposition: A | Discharge status: Home / Self Care | Payer: Medicare Other | Source: Ambulatory Visit | Attending: Specialist | Admitting: Specialist

## 2015-07-09 ENCOUNTER — Ambulatory Visit (INDEPENDENT_AMBULATORY_CARE_PROVIDER_SITE_OTHER): Payer: Medicare Other | Admitting: Family Medicine

## 2015-07-09 ENCOUNTER — Ambulatory Visit: Payer: Medicare Other | Admitting: Family Medicine

## 2015-07-09 VITALS — BP 153/62 | HR 87 | Temp 98.5°F | Ht 64.0 in | Wt 172.6 lb

## 2015-07-09 DIAGNOSIS — Z87891 Personal history of nicotine dependence: Secondary | ICD-10-CM | POA: Diagnosis not present

## 2015-07-09 DIAGNOSIS — F329 Major depressive disorder, single episode, unspecified: Secondary | ICD-10-CM | POA: Diagnosis not present

## 2015-07-09 DIAGNOSIS — Z6829 Body mass index (BMI) 29.0-29.9, adult: Secondary | ICD-10-CM | POA: Diagnosis not present

## 2015-07-09 DIAGNOSIS — Z79899 Other long term (current) drug therapy: Secondary | ICD-10-CM | POA: Diagnosis not present

## 2015-07-09 DIAGNOSIS — R7303 Prediabetes: Secondary | ICD-10-CM | POA: Diagnosis not present

## 2015-07-09 DIAGNOSIS — F419 Anxiety disorder, unspecified: Secondary | ICD-10-CM | POA: Diagnosis not present

## 2015-07-09 DIAGNOSIS — R1314 Dysphagia, pharyngoesophageal phase: Secondary | ICD-10-CM | POA: Diagnosis not present

## 2015-07-09 DIAGNOSIS — K219 Gastro-esophageal reflux disease without esophagitis: Secondary | ICD-10-CM | POA: Diagnosis not present

## 2015-07-09 DIAGNOSIS — R7309 Other abnormal glucose: Secondary | ICD-10-CM | POA: Diagnosis not present

## 2015-07-09 DIAGNOSIS — G5601 Carpal tunnel syndrome, right upper limb: Secondary | ICD-10-CM | POA: Diagnosis not present

## 2015-07-09 DIAGNOSIS — J45909 Unspecified asthma, uncomplicated: Secondary | ICD-10-CM | POA: Diagnosis not present

## 2015-07-09 DIAGNOSIS — E669 Obesity, unspecified: Secondary | ICD-10-CM | POA: Diagnosis not present

## 2015-07-09 DIAGNOSIS — G4730 Sleep apnea, unspecified: Secondary | ICD-10-CM | POA: Diagnosis not present

## 2015-07-09 DIAGNOSIS — Z791 Long term (current) use of non-steroidal anti-inflammatories (NSAID): Secondary | ICD-10-CM | POA: Diagnosis not present

## 2015-07-09 DIAGNOSIS — E785 Hyperlipidemia, unspecified: Secondary | ICD-10-CM | POA: Diagnosis not present

## 2015-07-09 DIAGNOSIS — I10 Essential (primary) hypertension: Secondary | ICD-10-CM | POA: Diagnosis not present

## 2015-07-09 DIAGNOSIS — Z85118 Personal history of other malignant neoplasm of bronchus and lung: Secondary | ICD-10-CM | POA: Diagnosis not present

## 2015-07-09 LAB — POCT GLYCOSYLATED HEMOGLOBIN (HGB A1C): HEMOGLOBIN A1C: 5.8

## 2015-07-09 LAB — BASIC METABOLIC PANEL
Anion gap: 9 (ref 5–15)
BUN: 7 mg/dL (ref 6–20)
CO2: 32 mmol/L (ref 22–32)
CREATININE: 0.8 mg/dL (ref 0.44–1.00)
Calcium: 8.9 mg/dL (ref 8.9–10.3)
Chloride: 97 mmol/L — ABNORMAL LOW (ref 101–111)
GFR calc Af Amer: >60 mL/min (ref >60)
GLUCOSE: 162 mg/dL — AB (ref 65–99)
Potassium: 3.3 mmol/L — ABNORMAL LOW (ref 3.5–5.1)
SODIUM: 138 mmol/L (ref 135–145)

## 2015-07-09 NOTE — Patient Instructions (Signed)
Thank you for coming in to clinic today.  1. You have had elevated blood sugar on previous blood work prior to your surgeries. Today we checked A1c this is 5.8, which is in the Pre Diabetes range (5.7 to 6.4) if it is >6.5 then this is considered diabetes 2. Recommend lifestyle changes control this, no medication at this time in future talk about Metformin medication if needed  Eat at least 3 meals and 1-2 snacks per day (don't skip breakfast).  Aim for no more than 5 hours between eating. - Tip: If you go >5 hours without eating and become very hungry, your body will supply it's own resources temporarily and you can gain extra weight when you eat.  Diet Recommendations for Pre Diabetes   Starchy (carb) foods include: Bread, rice, pasta, potatoes, corn, crackers, bagels, muffins, all baked goods.   Protein foods include: Meat, fish, poultry, eggs, dairy foods, and beans such as pinto and kidney beans (beans also provide carbohydrate).   1. Eat at least 3 meals and 1-2 snacks per day. Never go more than 4-5 hours while awake without eating.   2. Limit starchy foods to TWO per meal and ONE per snack. ONE portion of a starchy  food is equal to the following:   - ONE slice of bread (or its equivalent, such as half of a hamburger bun).   - 1/2 cup of a "scoopable" starchy food such as potatoes or rice.   - 1 OUNCE (28 grams) of starchy snacks (crackers or pretzels, look on label).   - 15 grams of carbohydrate as shown on food label.   3. Both lunch and dinner should include a protein food, a carb food, and vegetables.   - Obtain twice as many veg's as protein or carbohydrate foods for both lunch and dinner.   - Try to keep frozen veg's on hand for a quick vegetable serving.     - Fresh or frozen veg's are best.   4. Breakfast should always include protein.     Please try to contact Dr Manus Rudd - to schedule follow-up for "Dysphagia" you had prior EGD procedure for dilatation and also  history of GERD [facility] [place], [place] [address] Main ph: [phone]   Call us if you need a referral to be seen again  Please schedule a follow-up appointment with [facility] in 3 months to re-check A1c for Pre Diabetes (also get fasting blood work for Lipid Panel)  If you have any other questions or concerns, please feel free to call the clinic to contact me. You may also schedule an earlier appointment if necessary.  However, if your symptoms get significantly worse, please go to the Emergency Department to seek immediate medical attention.  [name], [place]

## 2015-07-09 NOTE — Progress Notes (Signed)
Subjective:    Patient ID: [name], female    DOB: 1960/12/08, 55 y.o.   MRN: [id number]  [name] is a 55 y.o. female presenting on 07/09/2015 for Elevated Blood Sugar  HPI  ELEVATED BLOOD SUGAR / PRE-DIABETES - Reports recent blood work over past 6 months with several significantly elevated glucose readings from 110-170, patient admits that she was not fasting for these blood draws, and these were pre-op lab tests, as she is followed by hand surgery for carpal tunnel release - Today she requests additional testing for elevated blood sugar. She is concerned about possibility of diabetes. - Family history with niece having Diabetes on insulin,  Otherwise no other direct family history of DM - Not currently following DM diet, and not fully aware of appropriate foods, interested in Nutrition - No regular exercise plan currently - Denies significant polyuria, polyphagia, polydipsia, fatigue, weakness  DYSPHAGIA / VOMITING: - Known prior history of chronic GERD and Dysphagia with h/o incomplete Schatzki ring s/p dilatation, followed by GI (Dr Gala Romney, [facility]), last seen 01/2014, had been treated with Dexilant, also prior EGD with dilatation without complete resolution of problem, also had prior gastric emptying study (normal and normal hida scan, 2016). - Today reports gradual worsening of similar prior symptoms with some post-prandial nausea / vomiting, also some resumed dysphagia. She would like to return to prior GI. No new or worsening symptoms. - Denies abdominal pain, diarrhea, constipation, choking, dyspnea, coughing   Social History  Substance Use Topics  . Smoking status: Former Smoker -- 1.50 packs/day for 25 years    Types: Cigarettes    Quit date: 04/07/1995  . Smokeless tobacco: Never Used  . Alcohol Use: No    Review of Systems Per HPI unless specifically indicated above     Objective:    BP 153/62 mmHg  Pulse 87  Temp(Src) 98.5 F (36.9 C)  (Oral)  Ht '5\' 4"'$  (1.626 m)  Wt 172 lb 9.6 oz (78.291 kg)  BMI 29.61 kg/m2  SpO2 100%  Wt Readings from Last 3 Encounters:  07/09/15 172 lb 9.6 oz (78.291 kg)  07/07/15 174 lb (78.926 kg)  04/11/15 174 lb (78.926 kg)    Physical Exam  Constitutional: She appears well-developed and well-nourished. No distress.  Well-appearing, comfortable, cooperative  HENT:  Mouth/Throat: Oropharynx is clear and moist.  Neck: Normal range of motion. Neck supple. No thyromegaly present.  Cardiovascular: Normal rate and intact distal pulses.   Pulmonary/Chest: Effort normal. No stridor.  Musculoskeletal: She exhibits no edema.  Lymphadenopathy:    She has no cervical adenopathy.  Neurological: She is alert.  Skin: Skin is warm and dry. She is not diaphoretic.  Nursing note and vitals reviewed.  Results for orders placed or performed in visit on 07/09/15  POCT A1C  Result Value Ref Range   Hemoglobin A1C 5.8       Assessment & Plan:   Problem List Items Addressed This Visit    Prediabetes - Primary    A1c today 5.8 consistent with new pre-DM diagnosis, given recent elevated abnormal glucose on non-fasting chemistry panel for pre-op surgery screening. Concern given some family history of DM.  Plan: 1. Extensive counseling on pre-diabetes / diabetes and lifestyle modifications with regards to diet and exercise, discussed options in future with medications including metformin, mutual decision to hold meds at this time, allow for trial of lifestyle mods first 2. Advised may schedule with [facility] in future as  well 3. Follow-up in 3 months to repeat A1c, will need future fasting lipid panel      Dysphagia, pharyngoesophageal phase    Recurrence of dysphagia symptoms similar to prior, likely component related to GERD Advised to follow-up closely with her established GI in [place], will order future referral if needed       Other Visit Diagnoses    Abnormal blood sugar         Relevant Orders    POCT A1C (Completed)       No orders of the defined types were placed in this encounter.      Follow up plan: Return in about 3 months (around 10/09/2015) for pre diabetes.  [name], [place], PGY-3

## 2015-07-10 ENCOUNTER — Encounter: Payer: Self-pay | Admitting: Family Medicine

## 2015-07-10 NOTE — Assessment & Plan Note (Addendum)
A1c today 5.8 consistent with new pre-DM diagnosis, given recent elevated abnormal glucose on non-fasting chemistry panel for pre-op surgery screening. Concern given some family history of DM.  Plan: 1. Extensive counseling on pre-diabetes / diabetes and lifestyle modifications with regards to diet and exercise, discussed options in future with medications including metformin, mutual decision to hold meds at this time, allow for trial of lifestyle mods first 2. Advised may schedule with [facility] in future as well 3. Follow-up in 3 months to repeat A1c, will need future fasting lipid panel

## 2015-07-10 NOTE — H&P (Signed)
[name] is an 55 y.o. female.     This patient returns today to review EMG and nerve conduction studies. She is experiencing ongoing discomfort into her arms.  Complaints of numbness and paresthesias and also having complaints of pain and discomfort into her lower back.  She has had previous L4-5 fusion for spondylolisthesis with spinal stenosis.  She has also had a coccygectomy for a problem of coccygeal deformity with compression occurring of skin overlying the mid and distal portions of the coccyx as it rubs against the skin here.  Prior to that, she had undergone excision of a pericoccygeal mass that ended up being a benign epiploicae.  She returns today.  She underwent recent EMG and nerve conduction studies and the results of these studies are reviewed with her.  She has been having numbness and tingling into both her upper extremities in both arms, but the results of the EMG and nerve conduction tests are mild to moderate right median nerve entrapment at the wrist, negative on the left side.  [name] notes that the right side is the more concerning area.  Both sensory and motor components are affected by this carpal tunnel syndrome.  In discussion with [name], she indicates that the numbness and paresthesias are becoming constant into her hand on the right side. This is happening whether she is asleep at night or during the daytime and whether she is using the hand or not.   Past Medical History  Diagnosis Date  . HTN (hypertension)   . HLD (hyperlipidemia)   . Osteoarthrosis involving more than one site but not generalized   . Depression   . GERD (gastroesophageal reflux disease)   . Chronic abdominal pain   . Chronic joint pain   . Chronic back pain   . Cancer ([place]) 1997    left lung  . Palpitations 01/2013    chronic, intermittent  . Nausea and vomiting     chronic, recurrent  . Asthma     every now and then.  Wheezing and coughing  . Shortness of breath dyspnea     with exertion   . Anxiety     Past Surgical History  Procedure Laterality Date  . Tubal ligation    . Abdominal hysterectomy    . Abdominal surgery      ovarian cyst removal  . Lung cancer surgery  1997    age 61, resection only  . Bladder surgery  suspension x4    x 4  . Lobectomy Left   . Excision vaginal cyst Left 06/20/2012    Procedure: EXCISION LEFT LABIAL CYST;  Surgeon: Jonnie Kind, Trevino;  Location: [facility];  Service: Gynecology;  Laterality: Left;  . Colonoscopy with propofol N/A 11/22/2013    Dr. Gala Romney: Grade 3 and 4/internal hemorrhoids?"likely source of hematochezia Normal colonoscopy otherwise.   . Esophagogastroduodenoscopy (egd) with propofol N/A 11/22/2013    Dr. Gala Romney: Incomplete Schatzki's ring dilated and disrupted as described above.  Small hiatal hernia. Focally abnormal gastric mucosa of uncertain significance status post biopsy, reactive gastropathy, negative H.pylori  . Maloney dilation N/A 11/22/2013    Procedure: [name] DILATION;  Surgeon: Shawna Dolin, Trevino;  Location: [facility];  Service: Endoscopy;  Laterality: N/A;  56  . Biopsy N/A 11/22/2013    Procedure: GASTRIC BIOPSY;  Surgeon: Shawna Dolin, Trevino;  Location: [facility];  Service: Endoscopy;  Laterality: N/A;  . Colonoscopy    . Cholecystectomy N/A 04/05/2014    Procedure:  LAPAROSCOPIC CHOLECYSTECTOMY;  Surgeon: Shawna Signs Md, Trevino;  Location: [facility];  Service: General;  Laterality: N/A;  . Mass excision N/A 07/23/2014    Procedure: EXCISIONAL BIOPSY NODULE LEFT PARACOCCYGEAL AREA;  Surgeon: Shawna Oto, Trevino;  Location: [place];  Service: Orthopedics;  Laterality: N/A;  . Coccygectomy N/A 04/11/2015    Procedure: COCCYGECTOMY WITH OSTEOTOMY THROUGH AREA OF DEFORMITY;  Surgeon: Shawna Oto, Trevino;  Location: [place];  Service: Orthopedics;  Laterality: N/A;    Family History  Problem Relation Age of Onset  . Hypertension Mother   . Kidney disease Mother   . Hypertension Father   . Kidney disease Father   . Heart disease Father    . Cancer Father     leukemia  . Hypertension Sister   . Hypertension Sister   . Diabetes Other   . Heart disease Other     has Psychologist, forensic  . Colon cancer Neg Hx    Social History:  reports that she quit smoking about 20 years ago. Her smoking use included Cigarettes. She has a 37.5 pack-year smoking history. She has never used smokeless tobacco. She reports that she does not drink alcohol or use illicit drugs.  Allergies:  Allergies  Allergen Reactions  . Promethazine Hcl Nausea And Vomiting and Other (See Comments)    "Makes my head hurt really bad too"    No prescriptions prior to admission    Results for orders placed or performed in visit on 07/09/15 (from the past 48 hour(s))  POCT A1C     Status: None   Collection Time: 07/09/15  8:55 AM  Result Value Ref Range   Hemoglobin A1C 5.8    No results found.  Review of Systems  Constitutional: Negative.   HENT: Negative.   Respiratory: Negative.   Cardiovascular: Negative.   Gastrointestinal: Negative.   Genitourinary: Negative.   Psychiatric/Behavioral: Negative.     Height '5\' 5"'$  (1.651 m), weight 78.926 kg (174 lb). Physical Exam  Constitutional: She is oriented to person, place, and time. No distress.  HENT:  Head: Atraumatic.  Eyes: EOM are normal.  Neck: Normal range of motion.  Respiratory: No respiratory distress.  GI: She exhibits no distension.  Musculoskeletal: She exhibits tenderness.  Neurological: She is alert and oriented to person, place, and time.  Skin: Skin is warm and dry.  Psychiatric: She has a normal mood and affect.   PHYSICAL EXAMINATION:  Tinel sign is positive on the right wrist.  Phalen sign is positive in less than 10 seconds.    Reviewed the EMGs and nerve conduction studies of the right wrist.  Discussed with [name] the concerns regarding carpal tunnel syndrome.  The numbness and paresthesia is worsening and is becoming constant.  My recommendation is for a right carpal tunnel  release.  I do not think I could really recommend carpal tunnel release on the left even though she is having symptoms suggestive of carpal tunnel syndrome.  Negative EMG and nerve conduction studies suggest that this is not nearly as concerning a problem on the left as it is on the right.      Assessment: Right carpal tunnel syndrome   PLAN:  We discussed the risks and benefits of undergoing a right carpal tunnel release and the fact that she would need to use a splint for a period of 3 weeks.  She does have a night splint that she has been using.  She will bring this with her when  she returns following her surgery and we will remove her brace or splint postop and then use this to help splint her wrist following the removal of the sutures.  The risks of surgery including the risks of infection, bleeding, and risks to the nerve itself were discussed with [place].  Will go ahead and schedule this to be done as an outpatient procedure at [facility], PA-C 07/10/2015, 4:48 PM   Patient examined and lab reviewed with Ricard Dillon, PA-C.

## 2015-07-10 NOTE — Assessment & Plan Note (Signed)
Recurrence of dysphagia symptoms similar to prior, likely component related to GERD Advised to follow-up closely with her established GI in [place], will order future referral if needed

## 2015-07-11 ENCOUNTER — Ambulatory Visit (HOSPITAL_BASED_OUTPATIENT_CLINIC_OR_DEPARTMENT_OTHER)
Admission: RE | Admit: 2015-07-11 | Discharge: 2015-07-11 | Disposition: A | Discharge status: Home / Self Care | Payer: Medicare Other | Source: Ambulatory Visit | Attending: Specialist | Admitting: Specialist

## 2015-07-11 ENCOUNTER — Ambulatory Visit (HOSPITAL_BASED_OUTPATIENT_CLINIC_OR_DEPARTMENT_OTHER): Payer: Medicare Other | Admitting: Anesthesiology

## 2015-07-11 ENCOUNTER — Encounter (HOSPITAL_BASED_OUTPATIENT_CLINIC_OR_DEPARTMENT_OTHER)
Admission: RE | Disposition: A | Discharge status: Home / Self Care | Payer: Self-pay | Source: Ambulatory Visit | Attending: Specialist

## 2015-07-11 ENCOUNTER — Ambulatory Visit (HOSPITAL_COMMUNITY): Payer: Medicare Other

## 2015-07-11 ENCOUNTER — Encounter (HOSPITAL_BASED_OUTPATIENT_CLINIC_OR_DEPARTMENT_OTHER): Payer: Self-pay

## 2015-07-11 DIAGNOSIS — Z87891 Personal history of nicotine dependence: Secondary | ICD-10-CM | POA: Diagnosis not present

## 2015-07-11 DIAGNOSIS — K219 Gastro-esophageal reflux disease without esophagitis: Secondary | ICD-10-CM | POA: Diagnosis not present

## 2015-07-11 DIAGNOSIS — F329 Major depressive disorder, single episode, unspecified: Secondary | ICD-10-CM | POA: Insufficient documentation

## 2015-07-11 DIAGNOSIS — Z791 Long term (current) use of non-steroidal anti-inflammatories (NSAID): Secondary | ICD-10-CM | POA: Diagnosis not present

## 2015-07-11 DIAGNOSIS — F419 Anxiety disorder, unspecified: Secondary | ICD-10-CM | POA: Diagnosis not present

## 2015-07-11 DIAGNOSIS — E785 Hyperlipidemia, unspecified: Secondary | ICD-10-CM | POA: Insufficient documentation

## 2015-07-11 DIAGNOSIS — G5601 Carpal tunnel syndrome, right upper limb: Secondary | ICD-10-CM | POA: Diagnosis present

## 2015-07-11 DIAGNOSIS — I10 Essential (primary) hypertension: Secondary | ICD-10-CM | POA: Insufficient documentation

## 2015-07-11 DIAGNOSIS — G4730 Sleep apnea, unspecified: Secondary | ICD-10-CM | POA: Insufficient documentation

## 2015-07-11 DIAGNOSIS — Z79899 Other long term (current) drug therapy: Secondary | ICD-10-CM | POA: Insufficient documentation

## 2015-07-11 DIAGNOSIS — E669 Obesity, unspecified: Secondary | ICD-10-CM | POA: Insufficient documentation

## 2015-07-11 DIAGNOSIS — J45909 Unspecified asthma, uncomplicated: Secondary | ICD-10-CM | POA: Diagnosis not present

## 2015-07-11 DIAGNOSIS — Z85118 Personal history of other malignant neoplasm of bronchus and lung: Secondary | ICD-10-CM | POA: Insufficient documentation

## 2015-07-11 DIAGNOSIS — Z6829 Body mass index (BMI) 29.0-29.9, adult: Secondary | ICD-10-CM | POA: Diagnosis not present

## 2015-07-11 DIAGNOSIS — Z01818 Encounter for other preprocedural examination: Secondary | ICD-10-CM

## 2015-07-11 HISTORY — PX: CARPAL TUNNEL RELEASE: SHX101

## 2015-07-11 HISTORY — DX: Anxiety disorder, unspecified: F41.9

## 2015-07-11 SURGERY — CARPAL TUNNEL RELEASE
Anesthesia: General | Site: Hand | Laterality: Right

## 2015-07-11 MED ORDER — MORPHINE SULFATE (PF) 2 MG/ML IV SOLN: 1.0000 mg | INTRAVENOUS | Status: DC | PRN

## 2015-07-11 MED ORDER — DOCUSATE SODIUM 100 MG PO CAPS: 100.0000 mg | ORAL_CAPSULE | Freq: Two times a day (BID) | ORAL | Status: DC

## 2015-07-11 MED ORDER — DIPHENHYDRAMINE HCL 12.5 MG/5ML PO ELIX: 12.5000 mg | ORAL_SOLUTION | ORAL | Status: DC | PRN

## 2015-07-11 MED ORDER — HYDROCODONE-ACETAMINOPHEN 7.5-325 MG PO TABS: 1.0000 | ORAL_TABLET | Freq: Four times a day (QID) | ORAL | Status: DC

## 2015-07-11 MED ORDER — METHOCARBAMOL 500 MG PO TABS: 500.0000 mg | ORAL_TABLET | Freq: Four times a day (QID) | ORAL | Status: DC | PRN

## 2015-07-11 MED ORDER — METOCLOPRAMIDE HCL 5 MG PO TABS: 5.0000 mg | ORAL_TABLET | Freq: Three times a day (TID) | ORAL | Status: DC | PRN

## 2015-07-11 MED ORDER — MIDAZOLAM HCL 2 MG/2ML IJ SOLN
INTRAMUSCULAR | Status: AC
  Filled 2015-07-11: qty 2

## 2015-07-11 MED ORDER — GLYCOPYRROLATE 0.2 MG/ML IJ SOLN: 0.2000 mg | Freq: Once | INTRAMUSCULAR | Status: DC | PRN

## 2015-07-11 MED ORDER — HYDROCODONE-ACETAMINOPHEN 7.5-325 MG PO TABS: 1.0000 | ORAL_TABLET | ORAL | Status: DC | PRN

## 2015-07-11 MED ORDER — FENTANYL CITRATE (PF) 100 MCG/2ML IJ SOLN
50.0000 ug | INTRAMUSCULAR | Status: AC | PRN
  Administered 2015-07-11 (×4): 25 ug via INTRAVENOUS

## 2015-07-11 MED ORDER — ONDANSETRON HCL 4 MG/2ML IJ SOLN
INTRAMUSCULAR | Status: DC | PRN
  Administered 2015-07-11: 4 mg via INTRAVENOUS

## 2015-07-11 MED ORDER — SODIUM CHLORIDE 0.9 % IV SOLN: INTRAVENOUS | Status: DC

## 2015-07-11 MED ORDER — ACETAMINOPHEN 325 MG PO TABS: 650.0000 mg | ORAL_TABLET | Freq: Four times a day (QID) | ORAL | Status: DC | PRN

## 2015-07-11 MED ORDER — ONDANSETRON HCL 4 MG PO TABS: 4.0000 mg | ORAL_TABLET | Freq: Four times a day (QID) | ORAL | Status: DC | PRN

## 2015-07-11 MED ORDER — LIDOCAINE 2% (20 MG/ML) 5 ML SYRINGE
INTRAMUSCULAR | Status: DC | PRN
  Administered 2015-07-11: 100 mg via INTRAVENOUS

## 2015-07-11 MED ORDER — SCOPOLAMINE 1 MG/3DAYS TD PT72: 1.0000 | MEDICATED_PATCH | Freq: Once | TRANSDERMAL | Status: DC | PRN

## 2015-07-11 MED ORDER — PROPOFOL 10 MG/ML IV BOLUS
INTRAVENOUS | Status: AC
  Filled 2015-07-11: qty 20

## 2015-07-11 MED ORDER — METHOCARBAMOL 1000 MG/10ML IJ SOLN: 500.0000 mg | Freq: Four times a day (QID) | INTRAVENOUS | Status: DC | PRN

## 2015-07-11 MED ORDER — ALBUTEROL SULFATE HFA 108 (90 BASE) MCG/ACT IN AERS: 1.0000 | INHALATION_SPRAY | RESPIRATORY_TRACT | Status: DC | PRN

## 2015-07-11 MED ORDER — FENTANYL CITRATE (PF) 100 MCG/2ML IJ SOLN
INTRAMUSCULAR | Status: AC
  Filled 2015-07-11: qty 2

## 2015-07-11 MED ORDER — CHLORHEXIDINE GLUCONATE 4 % EX LIQD: 60.0000 mL | Freq: Once | CUTANEOUS | Status: DC

## 2015-07-11 MED ORDER — LIDOCAINE 2% (20 MG/ML) 5 ML SYRINGE
INTRAMUSCULAR | Status: AC
  Filled 2015-07-11: qty 5

## 2015-07-11 MED ORDER — PROMETHAZINE HCL 25 MG/ML IJ SOLN: 6.2500 mg | INTRAMUSCULAR | Status: DC | PRN

## 2015-07-11 MED ORDER — BUPIVACAINE HCL (PF) 0.5 % IJ SOLN
INTRAMUSCULAR | Status: AC
  Filled 2015-07-11: qty 30

## 2015-07-11 MED ORDER — ACETAMINOPHEN 500 MG PO TABS: 1000.0000 mg | ORAL_TABLET | Freq: Four times a day (QID) | ORAL | Status: DC

## 2015-07-11 MED ORDER — PROPOFOL 10 MG/ML IV BOLUS
INTRAVENOUS | Status: DC | PRN
  Administered 2015-07-11: 250 mg via INTRAVENOUS
  Administered 2015-07-11: 50 mg via INTRAVENOUS

## 2015-07-11 MED ORDER — OXYCODONE-ACETAMINOPHEN 5-325 MG PO TABS: 1.0000 | ORAL_TABLET | ORAL | Status: DC | PRN

## 2015-07-11 MED ORDER — LACTATED RINGERS IV SOLN
INTRAVENOUS | Status: DC
  Administered 2015-07-11 (×2): via INTRAVENOUS

## 2015-07-11 MED ORDER — MIDAZOLAM HCL 2 MG/2ML IJ SOLN
1.0000 mg | INTRAMUSCULAR | Status: DC | PRN
  Administered 2015-07-11: 2 mg via INTRAVENOUS

## 2015-07-11 MED ORDER — ONDANSETRON HCL 4 MG/2ML IJ SOLN
INTRAMUSCULAR | Status: AC
  Filled 2015-07-11: qty 2

## 2015-07-11 MED ORDER — DEXAMETHASONE SODIUM PHOSPHATE 4 MG/ML IJ SOLN
INTRAMUSCULAR | Status: DC | PRN
  Administered 2015-07-11: 10 mg via INTRAVENOUS

## 2015-07-11 MED ORDER — BUPIVACAINE HCL (PF) 0.5 % IJ SOLN
INTRAMUSCULAR | Status: DC | PRN
  Administered 2015-07-11: 3 mL

## 2015-07-11 MED ORDER — METOCLOPRAMIDE HCL 5 MG/ML IJ SOLN: 5.0000 mg | Freq: Three times a day (TID) | INTRAMUSCULAR | Status: DC | PRN

## 2015-07-11 MED ORDER — CEFAZOLIN SODIUM-DEXTROSE 2-4 GM/100ML-% IV SOLN
INTRAVENOUS | Status: AC
  Filled 2015-07-11: qty 100

## 2015-07-11 MED ORDER — CEFAZOLIN SODIUM-DEXTROSE 2-4 GM/100ML-% IV SOLN
2.0000 g | INTRAVENOUS | Status: AC
  Administered 2015-07-11: 2 g via INTRAVENOUS

## 2015-07-11 MED ORDER — DEXAMETHASONE SODIUM PHOSPHATE 10 MG/ML IJ SOLN
INTRAMUSCULAR | Status: AC
  Filled 2015-07-11: qty 1

## 2015-07-11 MED ORDER — HYDROCODONE-ACETAMINOPHEN 5-325 MG PO TABS
ORAL_TABLET | ORAL | Status: AC
  Filled 2015-07-11: qty 1

## 2015-07-11 MED ORDER — HYDROCODONE-ACETAMINOPHEN 5-325 MG PO TABS
1.0000 | ORAL_TABLET | ORAL | Status: DC | PRN
  Administered 2015-07-11: 1 via ORAL

## 2015-07-11 MED ORDER — ONDANSETRON HCL 4 MG/2ML IJ SOLN: 4.0000 mg | Freq: Four times a day (QID) | INTRAMUSCULAR | Status: DC | PRN

## 2015-07-11 MED ORDER — HYDROCODONE-ACETAMINOPHEN 5-325 MG PO TABS: 1.0000 | ORAL_TABLET | Freq: Four times a day (QID) | ORAL | Status: DC | PRN

## 2015-07-11 MED ORDER — HYDROMORPHONE HCL 1 MG/ML IJ SOLN
INTRAMUSCULAR | Status: AC
  Filled 2015-07-11: qty 1

## 2015-07-11 MED ORDER — HYDROMORPHONE HCL 1 MG/ML IJ SOLN
0.2500 mg | INTRAMUSCULAR | Status: DC | PRN
  Administered 2015-07-11: 0.5 mg via INTRAVENOUS

## 2015-07-11 MED ORDER — ACETAMINOPHEN 650 MG RE SUPP: 650.0000 mg | Freq: Four times a day (QID) | RECTAL | Status: DC | PRN

## 2015-07-11 SURGICAL SUPPLY — 50 items
ADH SKN CLS APL DERMABOND .7 (GAUZE/BANDAGES/DRESSINGS) ×1
APL SKNCLS STERI-STRIP NONHPOA (GAUZE/BANDAGES/DRESSINGS)
BANDAGE ACE 3X5.8 VEL STRL LF (GAUZE/BANDAGES/DRESSINGS) ×2 IMPLANT
BENZOIN TINCTURE PRP APPL 2/3 (GAUZE/BANDAGES/DRESSINGS) IMPLANT
BLADE SURG 15 STRL LF DISP TIS (BLADE) ×1 IMPLANT
BLADE SURG 15 STRL SS (BLADE) ×2
BNDG CMPR 9X4 STRL LF SNTH (GAUZE/BANDAGES/DRESSINGS) ×1
BNDG ESMARK 4X9 LF (GAUZE/BANDAGES/DRESSINGS) ×1 IMPLANT
CORDS BIPOLAR (ELECTRODE) ×2 IMPLANT
COVER BACK TABLE 60X90IN (DRAPES) ×2 IMPLANT
COVER MAYO STAND STRL (DRAPES) ×2 IMPLANT
CUFF TOURNIQUET SINGLE 18IN (TOURNIQUET CUFF) ×2 IMPLANT
DERMABOND ADVANCED (GAUZE/BANDAGES/DRESSINGS) ×1
DERMABOND ADVANCED .7 DNX12 (GAUZE/BANDAGES/DRESSINGS) ×1 IMPLANT
DRAPE EXTREMITY T 121X128X90 (DRAPE) ×2 IMPLANT
DRAPE SURG 17X23 STRL (DRAPES) ×2 IMPLANT
DRSG EMULSION OIL 3X3 NADH (GAUZE/BANDAGES/DRESSINGS) ×2 IMPLANT
DRSG PAD ABDOMINAL 8X10 ST (GAUZE/BANDAGES/DRESSINGS) IMPLANT
DURAPREP 26ML APPLICATOR (WOUND CARE) ×2 IMPLANT
ELECT REM PT RETURN 9FT ADLT (ELECTROSURGICAL)
ELECTRODE REM PT RTRN 9FT ADLT (ELECTROSURGICAL) IMPLANT
GAUZE SPONGE 4X4 12PLY STRL (GAUZE/BANDAGES/DRESSINGS) ×2 IMPLANT
GLOVE BIOGEL PI IND STRL 8 (GLOVE) ×1 IMPLANT
GLOVE BIOGEL PI IND STRL 9 (GLOVE) ×1 IMPLANT
GLOVE BIOGEL PI INDICATOR 8 (GLOVE) ×1
GLOVE BIOGEL PI INDICATOR 9 (GLOVE) ×1
GLOVE ORTHO TXT STRL SZ7.5 (GLOVE) ×2 IMPLANT
GOWN STRL REUS W/ TWL LRG LVL3 (GOWN DISPOSABLE) ×1 IMPLANT
GOWN STRL REUS W/TWL 2XL LVL3 (GOWN DISPOSABLE) ×2 IMPLANT
GOWN STRL REUS W/TWL LRG LVL3 (GOWN DISPOSABLE) ×2
LATEX SURGICAL GLOVE BIOGEL SIZE 9 ×1 IMPLANT
NEEDLE HYPO 22GX1.5 SAFETY (NEEDLE) IMPLANT
PACK BASIN DAY SURGERY FS (CUSTOM PROCEDURE TRAY) ×2 IMPLANT
PAD CAST 3X4 CTTN HI CHSV (CAST SUPPLIES) ×1 IMPLANT
PADDING CAST ABS 4INX4YD NS (CAST SUPPLIES) ×1
PADDING CAST ABS COTTON 4X4 ST (CAST SUPPLIES) ×1 IMPLANT
PADDING CAST COTTON 3X4 STRL (CAST SUPPLIES) ×2
PENCIL BUTTON HOLSTER BLD 10FT (ELECTRODE) IMPLANT
RUBBERBAND STERILE (MISCELLANEOUS) IMPLANT
SPLINT FIBERGLASS 3X35 (CAST SUPPLIES) ×1 IMPLANT
SPLINT PLASTER CAST XFAST 3X15 (CAST SUPPLIES) IMPLANT
SPLINT PLASTER XTRA FASTSET 3X (CAST SUPPLIES)
SPONGE GAUZE 4X4 12PLY STER LF (GAUZE/BANDAGES/DRESSINGS) ×1 IMPLANT
STOCKINETTE 4X48 STRL (DRAPES) ×2 IMPLANT
STRIP CLOSURE SKIN 1/2X4 (GAUZE/BANDAGES/DRESSINGS) IMPLANT
SUT ETHILON 4 0 PS 2 18 (SUTURE) ×2 IMPLANT
SUT VIC AB 3-0 FS2 27 (SUTURE) IMPLANT
SYR BULB 3OZ (MISCELLANEOUS) IMPLANT
SYR CONTROL 10ML LL (SYRINGE) IMPLANT
TOWEL OR 17X24 6PK STRL BLUE (TOWEL DISPOSABLE) ×2 IMPLANT

## 2015-07-11 NOTE — Discharge Instructions (Addendum)
° ° °  Keep dressing dry. Elevated wrist above heart. Apply ice to palm side of wrist two hours on and one half hour off for 48 hours. May Apply ice at night and go to sleep with out changing. Be sure to keep ice off fingers to prevent frost bite.  Return to office in two weeks for removal of sutures left wrist.   Post Anesthesia Home Care Instructions  Activity: Get plenty of rest for the remainder of the day. A responsible adult should stay with you for 24 hours following the procedure.  For the next 24 hours, DO NOT: -Drive a car -Paediatric nurse -Drink alcoholic beverages -Take any medication unless instructed by your physician -Make any legal decisions or sign important papers.  Meals: Start with liquid foods such as gelatin or soup. Progress to regular foods as tolerated. Avoid greasy, spicy, heavy foods. If nausea and/or vomiting occur, drink only clear liquids until the nausea and/or vomiting subsides. Call your physician if vomiting continues.  Special Instructions/Symptoms: Your throat may feel dry or sore from the anesthesia or the breathing tube placed in your throat during surgery. If this causes discomfort, gargle with warm salt water. The discomfort should disappear within 24 hours.  If you had a scopolamine patch placed behind your ear for the management of post- operative nausea and/or vomiting:  1. The medication in the patch is effective for 72 hours, after which it should be removed.  Wrap patch in a tissue and discard in the trash. Wash hands thoroughly with soap and water. 2. You may remove the patch earlier than 72 hours if you experience unpleasant side effects which may include dry mouth, dizziness or visual disturbances. 3. Avoid touching the patch. Wash your hands with soap and water after contact with the patch.

## 2015-07-11 NOTE — Anesthesia Preprocedure Evaluation (Addendum)
Anesthesia Evaluation  Patient identified by MRN, date of birth, ID band Patient awake    Reviewed: Allergy & Precautions, NPO status , Patient's Chart, lab work & pertinent test results  Airway Mallampati: II  TM Distance: >3 FB Neck ROM: Full  Mouth opening: Limited Mouth Opening  Dental  (+) Edentulous Upper, Loose, Dental Advisory Given,    Pulmonary asthma , sleep apnea , former smoker,    Pulmonary exam normal breath sounds clear to auscultation       Cardiovascular hypertension, Pt. on medications Normal cardiovascular exam Rhythm:Regular Rate:Normal     Neuro/Psych Depression    GI/Hepatic Neg liver ROS, GERD  Medicated and Controlled,  Endo/Other  negative endocrine ROS  Renal/GU negative Renal ROS     Musculoskeletal  (+) Arthritis ,   Abdominal (+) + obese,   Peds  Hematology negative hematology ROS (+)   Anesthesia Other Findings   Reproductive/Obstetrics                            Anesthesia Physical Anesthesia Plan  ASA: II  Anesthesia Plan: General   Post-op Pain Management:    Induction: Intravenous  Airway Management Planned: Natural Airway and Simple Face Mask  Additional Equipment:   Intra-op Plan:   Post-operative Plan:   Informed Consent: I have reviewed the patients History and Physical, chart, labs and discussed the procedure including the risks, benefits and alternatives for the proposed anesthesia with the patient or authorized representative who has indicated his/her understanding and acceptance.   Dental advisory given  Plan Discussed with: CRNA  Anesthesia Plan Comments:        Anesthesia Quick Evaluation

## 2015-07-11 NOTE — Transfer of Care (Signed)
Immediate Anesthesia Transfer of Care Note  Patient: [name]  Procedure(s) Performed: Procedure(s) (LRB): RIGHT CARPAL TUNNEL RELEASE (Right)  Patient Location: PACU  Anesthesia Type: General  Level of Consciousness: awake, sedated, patient cooperative and responds to stimulation  Airway & Oxygen Therapy: Patient Spontanous Breathing and Patient connected to face mask oxygen  Post-op Assessment: Report given to PACU RN, Post -op Vital signs reviewed and stable and Patient moving all extremities  Post vital signs: Reviewed and stable  Complications: No apparent anesthesia complications

## 2015-07-11 NOTE — Brief Op Note (Signed)
PATIENT ID:      PARNIKA TWETEN  MRN:     [id number] DOB/AGE:    [birth year] / 55 y.o.       OPERATIVE REPORT   DATE OF PROCEDURE:  07/11/2015      PREOPERATIVE DIAGNOSIS:   right carpal tunnel syndrome                                                       Body mass index is 29.34 kg/(m^2).    POSTOPERATIVE DIAGNOSIS:   right carpal tunnel syndrome                                                                     Body mass index is 29.34 kg/(m^2).    PROCEDURE:  Procedure(s): RIGHT CARPAL TUNNEL RELEASE    SURGEON: Ayano Douthitt E   ASSISTANT: Esaw Grandchild          ANESTHESIA:  General and GA combined with regional for post-op pain Marcaine  0.5% total 10cc.  EBL:30cc  DRAINS:None  TOURNIQUET TIME: 10 Min @ 837 mmHg  COMPLICATIONS:  None   CONDITION:  stable    Raysa Bosak E 07/11/2015, 2:01 PM

## 2015-07-11 NOTE — Op Note (Signed)
07/11/2015  2:12 PM  PATIENT:  [name]  55 y.o. female  MRN: [id number]  PRE-OPERATIVE DIAGNOSIS:  right carpal tunnel syndrome  POST-OPERATIVE DIAGNOSIS:  right carpal tunnel syndrome  PROCEDURE:  Procedure(s): RIGHT CARPAL TUNNEL RELEASE   OPERATIVE REPORT     SURGEON:  Jessy Oto, MD      ASSISTANT:  Benjiman Core, PA-C  (Present throughout the entire procedure and necessary for completion of procedure in a timely manner)      ANESTHESIA:  General, Supplemented with local Marcaine 0.5 % 10UV     COMPLICATIONS:  None.      TOURNIQUET TIME: 10 minutes at  237mHg   PROCEDURE: The patient was met in the holding area, and the appropriate right wrist identified and marked.  The patient was then transported to OR and was placed on the operative table in a supine position. The patient was then placed under general anesthesia without difficulty. The patient received appropriate preoperative antibiotic prophylaxis.     The right upper extremity was then prepped using sterile conditions and draped using sterile technique.  Time-out procedure was called and correct. The right wrist elevated and exanguinated with an esmarch bandage. Tourniquet inflated to 250 mm Hg. Using loope magnification and head lamp a 1.5 inch incision curved at the wrist crease with 15 blade scalpel. Incision through skin and subcutaneous tissue to the volar forearm fascia and  transverse carpal ligament.Palmaris longis identified and retracted radialward.   Fascia then carefully lifted and incised with Stevens scissors inline with the fourth digit. The skin and subcutaneous tissue retracted and the volar fascia divided under direct vision from distal to proximal. A freer elevator then carefully Placed superficial to the medial n. between the median nerve and the transverse carpal ligament protecting the  median nerve as the transverse carpal ligament was divided with a 15 blade scalpel in line with the fourth  digit. Retracting the distal skin and subcutaneous tissues distallywith a Sewell retractor under direct visualization the remaining portions of the transverse carpal ligament were divided with iridectomy tenotomy scissors again in line with the fourth digit. The palmar fascia was then divided until the traversing superficial palmar arch was identified and preserved intact.  The motor branch of the median nerve was carefully examined and identified intact. Tourniquet was then released. Bleeding controlled with bipolar electrocautery. The incision was then irrigated with copious amounts of irrigant solution, No active bleeding was present. The incision closed with a single layer skin closure of 4-0 nylon horizontal mattress sutures. Dry dressing of adaptic, 4x4s held in place with sterile webril.  A well padded volar splint applied with ace wrap.  The patient reactivated and returned to the PACU in good condition.  All instruments and sponge counts were correct.          Roseana Rhine E  07/11/2015, 2:12 PM

## 2015-07-11 NOTE — Anesthesia Procedure Notes (Signed)
Procedure Name: LMA Insertion Date/Time: 07/11/2015 1:26 PM Performed by: Justice Rocher Pre-anesthesia Checklist: Patient identified, Emergency Drugs available, Suction available and Patient being monitored Patient Re-evaluated:Patient Re-evaluated prior to inductionOxygen Delivery Method: Circle system utilized Preoxygenation: Pre-oxygenation with 100% oxygen Intubation Type: IV induction Ventilation: Mask ventilation without difficulty LMA: LMA inserted LMA Size: 4.0 Number of attempts: 1 Airway Equipment and Method: Bite block Placement Confirmation: positive ETCO2 Tube secured with: Tape Dental Injury: Teeth and Oropharynx as per pre-operative assessment

## 2015-07-11 NOTE — Interval H&P Note (Signed)
Patient was seen and examined in the preop holding area. There has been no interval  Change in this patient's exam preop  history and physical exam  Lab tests and images have been examined and reviewed.  The Risks benefits and alternative treatments have been discussed  extensively,questions answered.  The patient has elected to undergo the discussed surgical treatment. 

## 2015-07-11 NOTE — Anesthesia Postprocedure Evaluation (Signed)
Anesthesia Post Note  Patient: [name]  Procedure(s) Performed: Procedure(s) (LRB): RIGHT CARPAL TUNNEL RELEASE (Right)  Patient location during evaluation: PACU Anesthesia Type: MAC Level of consciousness: awake and alert Pain management: pain level controlled Vital Signs Assessment: post-procedure vital signs reviewed and stable Respiratory status: spontaneous breathing, nonlabored ventilation, respiratory function stable and patient connected to nasal cannula oxygen Cardiovascular status: stable and blood pressure returned to baseline Anesthetic complications: no    Last Vitals:  Filed Vitals:   07/11/15 1441 07/11/15 1445  BP:  126/70  Pulse: 81 83  Temp:    Resp: 16 22    Last Pain:  Filed Vitals:   07/11/15 1504  PainSc: 2                  Gwendloyn Forsee,JAMES TERRILL

## 2015-07-14 ENCOUNTER — Encounter (HOSPITAL_BASED_OUTPATIENT_CLINIC_OR_DEPARTMENT_OTHER): Payer: Self-pay | Admitting: Specialist

## 2015-07-24 ENCOUNTER — Other Ambulatory Visit: Payer: Self-pay

## 2015-07-24 ENCOUNTER — Ambulatory Visit (INDEPENDENT_AMBULATORY_CARE_PROVIDER_SITE_OTHER): Payer: Medicare Other | Admitting: Gastroenterology

## 2015-07-24 ENCOUNTER — Encounter: Payer: Self-pay | Admitting: Gastroenterology

## 2015-07-24 VITALS — BP 120/74 | HR 95 | Temp 98.3°F | Ht 64.0 in | Wt 169.4 lb

## 2015-07-24 DIAGNOSIS — R1314 Dysphagia, pharyngoesophageal phase: Secondary | ICD-10-CM

## 2015-07-24 DIAGNOSIS — R748 Abnormal levels of other serum enzymes: Secondary | ICD-10-CM | POA: Diagnosis not present

## 2015-07-24 DIAGNOSIS — R1310 Dysphagia, unspecified: Secondary | ICD-10-CM

## 2015-07-24 NOTE — Patient Instructions (Signed)
We have scheduled you for an upper endoscopy with Dr. Gala Romney in the near future.  Further recommendations to follow!

## 2015-07-24 NOTE — Progress Notes (Signed)
CC'D TO PCP °

## 2015-07-24 NOTE — Assessment & Plan Note (Signed)
Thorough evaluation with negative AMA, negative Hep B, C, normal GGT. Fatty liver. Non-specific, mild elevation most recently of 150. Normal transaminases. Will follow with yearly LFTs.

## 2015-07-24 NOTE — Assessment & Plan Note (Signed)
55 year old female with recurrent solid and liquid dysphagia in setting of GERD and known history of Schatzki's ring s/p dilation in 2015. Remains on Dexilant, which seems to offer symptomatic control of GERD symptoms. Query if edentulous status of upper palate may be playing a role. EGD for further evaluation recommended.   Proceed with upper endoscopy and dilation in the near future with Dr. Gala Romney. The risks, benefits, and alternatives have been discussed in detail with patient. They have stated understanding and desire to proceed.  PROPOFOL due to polypharmacy  Continue Dexilant

## 2015-07-24 NOTE — Progress Notes (Signed)
Referring Provider: Sydnee Levans* Primary Care Physician:  [name], DO  Primary GI: Dr. Gala Romney   Chief Complaint  Patient presents with  . Dysphagia    HPI:   [name] is a 55 y.o. female presenting today with a history of chronic constipation, GERD, dysphagia. Last EGD Dec 2015 with incomplete Schatzki's ring s/p dilation, small hiatal hernia. BPE normal in Feb 2016. History of chronically elevated alk phos with serologic markers negative for PBC. Hep BsAg and Hep C antibody negative. GGT normal. Most recent alk phos 150. Normal transaminases. Last US abdomen Feb 2016 with mild fatty liver.   Recurrent solid food and liquid dysphagia. Sometimes liquid goes up her nose. Sometimes feels stuck in her chest and one time she felt a french fry go up her nose. Just told she had pre-diabetes.   Amitiza 24 mcg BID is "wonderful" for constipation. Colace for constipation as well. Dexilant once daily for reflux.   Past Medical History  Diagnosis Date  . HTN (hypertension)   . HLD (hyperlipidemia)   . Osteoarthrosis involving more than one site but not generalized   . Depression   . GERD (gastroesophageal reflux disease)   . Chronic abdominal pain   . Chronic joint pain   . Chronic back pain   . Cancer ([place]) 1997    left lung  . Palpitations 01/2013    chronic, intermittent  . Nausea and vomiting     chronic, recurrent  . Asthma     every now and then.  Wheezing and coughing  . Shortness of breath dyspnea     with exertion  . Anxiety     Past Surgical History  Procedure Laterality Date  . Tubal ligation    . Abdominal hysterectomy    . Abdominal surgery      ovarian cyst removal  . Lung cancer surgery  1997    age 51, resection only  . Bladder surgery  suspension x4    x 4  . Lobectomy Left   . Excision vaginal cyst Left 06/20/2012    Procedure: EXCISION LEFT LABIAL CYST;  Surgeon: Jonnie Kind, MD;  Location: [facility];  Service: Gynecology;   Laterality: Left;  . Colonoscopy with propofol N/A 11/22/2013    Dr. Gala Romney: Grade 3 and 4/internal hemorrhoids?"likely source of hematochezia Normal colonoscopy otherwise.   . Esophagogastroduodenoscopy (egd) with propofol N/A 11/22/2013    Dr. Gala Romney: Incomplete Schatzki's ring dilated and disrupted as described above.  Small hiatal hernia. Focally abnormal gastric mucosa of uncertain significance status post biopsy, reactive gastropathy, negative H.pylori  . Maloney dilation N/A 11/22/2013    Procedure: [name] DILATION;  Surgeon: Daneil Dolin, MD;  Location: [facility];  Service: Endoscopy;  Laterality: N/A;  56  . Biopsy N/A 11/22/2013    Procedure: GASTRIC BIOPSY;  Surgeon: Daneil Dolin, MD;  Location: [facility];  Service: Endoscopy;  Laterality: N/A;  . Colonoscopy    . Cholecystectomy N/A 04/05/2014    Procedure: LAPAROSCOPIC CHOLECYSTECTOMY;  Surgeon: Aviva Signs Md, MD;  Location: [facility];  Service: General;  Laterality: N/A;  . Mass excision N/A 07/23/2014    Procedure: EXCISIONAL BIOPSY NODULE LEFT PARACOCCYGEAL AREA;  Surgeon: Jessy Oto, MD;  Location: [place];  Service: Orthopedics;  Laterality: N/A;  . Coccygectomy N/A 04/11/2015    Procedure: COCCYGECTOMY WITH OSTEOTOMY THROUGH AREA OF DEFORMITY;  Surgeon: Jessy Oto, MD;  Location: [place];  Service: Orthopedics;  Laterality: N/A;  .  Carpal tunnel release Right 07/11/2015    Procedure: RIGHT CARPAL TUNNEL RELEASE;  Surgeon: Jessy Oto, MD;  Location: [place];  Service: Orthopedics;  Laterality: Right;    Current Outpatient Prescriptions  Medication Sig Dispense Refill  . acyclovir (ZOVIRAX) 400 MG tablet TAKE ONE TABLET BY MOUTH TWICE DAILY 60 tablet 5  . albuterol (PROAIR HFA) 108 (90 BASE) MCG/ACT inhaler Inhale 2 puffs into the lungs every 4 (four) hours as needed. for shortness of breath 1 each 2  . AMITIZA 24 MCG capsule TAKE ONE CAPSULE BY MOUTH TWICE DAILY WITH MEALS 60 capsule 5  . amitriptyline (ELAVIL)  150 MG tablet TAKE ONE TABLET BY MOUTH ONCE DAILY WITH BREAKFAST 30 tablet 5  . atorvastatin (LIPITOR) 40 MG tablet Take 1 tablet (40 mg total) by mouth daily. 30 tablet 5  . Calcium Citrate-Vitamin D 250-200 MG-UNIT TABS Take 1 tablet by mouth daily.     Marland Kitchen DEXILANT 60 MG capsule TAKE ONE CAPSULE BY MOUTH ONCE DAILY 30 capsule 11  . docusate sodium (COLACE) 100 MG capsule Take 2 capsules (200 mg total) by mouth 2 (two) times daily. 100 capsule 0  . DULoxetine (CYMBALTA) 60 MG capsule Take 1 capsule (60 mg total) by mouth 2 (two) times daily. 120 capsule 11  . gabapentin (NEURONTIN) 300 MG capsule TAKE ONE CAPSULE BY MOUTH THREE TIMES DAILY 90 capsule 2  . hydrochlorothiazide (HYDRODIURIL) 25 MG tablet Take 1 tablet (25 mg total) by mouth daily. 90 tablet 2  . HYDROcodone-acetaminophen (NORCO) 5-325 MG tablet Take 1-2 tablets by mouth every 6 (six) hours as needed for moderate pain. MAXIMUM TOTAL ACETAMINOPHEN DOSE IS 4000 MG PER DAY 60 tablet 0  . meloxicam (MOBIC) 15 MG tablet Take 1 tablet (15 mg total) by mouth daily. 30 tablet 2  . methocarbamol (ROBAXIN) 500 MG tablet Take 1 tablet (500 mg total) by mouth every 8 (eight) hours as needed for muscle spasms. 40 tablet 1   No current facility-administered medications for this visit.    Allergies as of 07/24/2015 - Review Complete 07/24/2015  Allergen Reaction Noted  . Promethazine hcl Nausea And Vomiting and Other (See Comments) 07/26/2008    Family History  Problem Relation Age of Onset  . Hypertension Mother   . Kidney disease Mother   . Hypertension Father   . Kidney disease Father   . Heart disease Father   . Cancer Father     leukemia  . Hypertension Sister   . Hypertension Sister   . Diabetes Other   . Heart disease Other     has Psychologist, forensic  . Colon cancer Neg Hx     Social History   Social History  . Marital Status: Married    Spouse Name: N/A  . Number of Children: 3  . Years of Education: N/A   Social History  Main Topics  . Smoking status: Former Smoker -- 1.50 packs/day for 25 years    Types: Cigarettes    Quit date: 04/07/1995  . Smokeless tobacco: Never Used  . Alcohol Use: No  . Drug Use: No  . Sexual Activity: Not Currently    Birth Control/ Protection: Surgical   Other Topics Concern  . None   Social History Narrative    Review of Systems: As mentioned in HPI.   Physical Exam: BP 120/74 mmHg  Pulse 95  Temp(Src) 98.3 F (36.8 C) (Oral)  Ht 5' 4" (1.626 m)  Wt 169 lb 6.4 oz (  76.839 kg)  BMI 29.06 kg/m2 General:   Alert and oriented. No distress noted. Pleasant and cooperative.  Head:  Normocephalic and atraumatic. Eyes:  Conjuctiva clear without scleral icterus. Mouth:  Oral mucosa pink and moist. Edentulous upper palate  Heart:  S1, S2 present without murmurs, rubs, or gallops. Regular rate and rhythm. Resp: mild posterior wheezing bilaterally  Abdomen:  +BS, soft, non-tender and non-distended. No rebound or guarding. No HSM or masses noted. Msk:  Symmetrical without gross deformities. Normal posture. Extremities:  Without edema. Neurologic:  Alert and  oriented x4;  grossly normal neurologically. Skin:  Intact without significant lesions or rashes. Psych:  Alert and cooperative. Normal mood and affect.  Lab Results  Component Value Date   ALT 13 07/02/2015   AST 15 07/02/2015   ALKPHOS 150* 07/02/2015   BILITOT 0.4 07/02/2015

## 2015-07-28 NOTE — Patient Instructions (Signed)
[name]  07/28/2015     '@PREFPERIOPPHARMACY'$ @   Your procedure is scheduled on 08/04/2015.  Report to [facility] at 6:15 A.M.  Call this number if you have problems the morning of surgery:  [phone]   Remember:  Do not eat food or drink liquids after midnight.  Take these medicines the morning of surgery with A SIP OF WATER Albuterol inhaler and bring with you, Amitiza, Dexilant, Cymbalta, Gabapentin  Hydrocodone, Mobic, Robaxin   Do not wear jewelry, make-up or nail polish.  Do not wear lotions, powders, or perfumes.  You may wear deoderant.  Do not shave 48 hours prior to surgery.  Men may shave face and neck.  Do not bring valuables to the hospital.  [facility] is not responsible for any belongings or valuables.  Contacts, dentures or bridgework may not be worn into surgery.  Leave your suitcase in the car.  After surgery it may be brought to your room.  For patients admitted to the hospital, discharge time will be determined by your treatment team.  Patients discharged the day of surgery will not be allowed to drive home.   Please read over the following fact sheets that you were given. Anesthesia Post-op Instructions     PATIENT INSTRUCTIONS POST-ANESTHESIA  IMMEDIATELY FOLLOWING SURGERY:  Do not drive or operate machinery for the first twenty four hours after surgery.  Do not make any important decisions for twenty four hours after surgery or while taking narcotic pain medications or sedatives.  If you develop intractable nausea and vomiting or a severe headache please notify your doctor immediately.  FOLLOW-UP:  Please make an appointment with your surgeon as instructed. You do not need to follow up with anesthesia unless specifically instructed to do so.  WOUND CARE INSTRUCTIONS (if applicable):  Keep a dry clean dressing on the anesthesia/puncture wound site if there is drainage.  Once the wound has quit draining you may leave it open to air.  Generally you  should leave the bandage intact for twenty four hours unless there is drainage.  If the epidural site drains for more than 36-48 hours please call the anesthesia department.  QUESTIONS?:  Please feel free to call your physician or the hospital operator if you have any questions, and they will be happy to assist you.      Esophagogastroduodenoscopy Esophagogastroduodenoscopy (EGD) is a procedure that is used to examine the lining of the esophagus, stomach, and first part of the small intestine (duodenum). A long, flexible, lighted tube with a camera attached (endoscope) is inserted down the throat to view these organs. This procedure is done to detect problems or abnormalities, such as inflammation, bleeding, ulcers, or growths, in order to treat them. The procedure lasts 5-20 minutes. It is usually an outpatient procedure, but it may need to be performed in a hospital in emergency cases. LET [facility] CARE PROVIDER KNOW ABOUT:  Any allergies you have.  All medicines you are taking, including vitamins, herbs, eye drops, creams, and over-the-counter medicines.  Previous problems you or members of your family have had with the use of anesthetics.  Any blood disorders you have.  Previous surgeries you have had.  Medical conditions you have. RISKS AND COMPLICATIONS Generally, this is a safe procedure. However, problems can occur and include:  Infection.  Bleeding.  Tearing (perforation) of the esophagus, stomach, or duodenum.  Difficulty breathing or not being able to breathe.  Excessive sweating.  Spasms of the larynx.  Slowed heartbeat.  Low blood pressure. BEFORE THE PROCEDURE  Do not eat or drink anything after midnight on the night before the procedure or as directed by your health care provider.  Do not take your regular medicines before the procedure if your health care provider asks you not to. Ask your health care provider about changing or stopping those  medicines.  If you wear dentures, be prepared to remove them before the procedure.  Arrange for someone to drive you home after the procedure. PROCEDURE  A numbing medicine (local anesthetic) may be sprayed in your throat for comfort and to stop you from gagging or coughing.  You will have an IV tube inserted in a vein in your hand or arm. You will receive medicines and fluids through this tube.  You will be given a medicine to relax you (sedative).  A pain reliever will be given through the IV tube.  A mouth guard may be placed in your mouth to protect your teeth and to keep you from biting on the endoscope.  You will be asked to lie on your left side.  The endoscope will be inserted down your throat and into your esophagus, stomach, and duodenum.  Air will be put through the endoscope to allow your health care provider to clearly view the lining of your esophagus.  The lining of your esophagus, stomach, and duodenum will be examined. During the exam, your health care provider may:  Remove tissue to be examined under a microscope (biopsy) for inflammation, infection, or other medical problems.  Remove growths.  Remove objects (foreign bodies) that are stuck.  Treat any bleeding with medicines or other devices that stop tissues from bleeding (hot cautery, clipping devices).  Widen (dilate) or stretch narrowed areas of your esophagus and stomach.  The endoscope will be withdrawn. AFTER THE PROCEDURE  You will be taken to a recovery area for observation. Your blood pressure, heart rate, breathing rate, and blood oxygen level will be monitored often until the medicines you were given have worn off.  Do not eat or drink anything until the numbing medicine has worn off and your gag reflex has returned. You may choke.  Your health care provider should be able to discuss his or her findings with you. It will take longer to discuss the test results if any biopsies were taken.    This information is not intended to replace advice given to you by your health care provider. Make sure you discuss any questions you have with your health care provider.   Document Released: 05/07/2004 Document Revised: 01/25/2014 Document Reviewed: 12/08/2011 Elsevier Interactive Patient Education [employer].

## 2015-07-30 ENCOUNTER — Encounter (HOSPITAL_COMMUNITY): Payer: Self-pay

## 2015-07-30 ENCOUNTER — Encounter (HOSPITAL_COMMUNITY)
Admission: RE | Admit: 2015-07-30 | Discharge: 2015-07-30 | Disposition: A | Discharge status: Home / Self Care | Payer: Medicare Other | Source: Ambulatory Visit | Attending: Internal Medicine | Admitting: Internal Medicine

## 2015-07-30 DIAGNOSIS — E785 Hyperlipidemia, unspecified: Secondary | ICD-10-CM | POA: Insufficient documentation

## 2015-07-30 DIAGNOSIS — K219 Gastro-esophageal reflux disease without esophagitis: Secondary | ICD-10-CM | POA: Insufficient documentation

## 2015-07-30 DIAGNOSIS — F419 Anxiety disorder, unspecified: Secondary | ICD-10-CM | POA: Insufficient documentation

## 2015-07-30 DIAGNOSIS — I10 Essential (primary) hypertension: Secondary | ICD-10-CM | POA: Insufficient documentation

## 2015-07-30 DIAGNOSIS — F329 Major depressive disorder, single episode, unspecified: Secondary | ICD-10-CM | POA: Diagnosis not present

## 2015-07-30 DIAGNOSIS — Z01812 Encounter for preprocedural laboratory examination: Secondary | ICD-10-CM | POA: Insufficient documentation

## 2015-07-30 LAB — CBC
HCT: 38.4 % (ref 36.0–46.0)
Hemoglobin: 12.9 g/dL (ref 12.0–15.0)
MCH: 31.3 pg (ref 26.0–34.0)
MCHC: 33.6 g/dL (ref 30.0–36.0)
MCV: 93.2 fL (ref 78.0–100.0)
PLATELETS: 284 10*3/uL (ref 150–400)
RBC: 4.12 MIL/uL (ref 3.87–5.11)
RDW: 12.6 % (ref 11.5–15.5)
WBC: 5.3 10*3/uL (ref 4.0–10.5)

## 2015-07-30 LAB — BASIC METABOLIC PANEL
ANION GAP: 8 (ref 5–15)
BUN: 5 mg/dL — ABNORMAL LOW (ref 6–20)
CALCIUM: 9 mg/dL (ref 8.9–10.3)
CO2: 31 mmol/L (ref 22–32)
Chloride: 99 mmol/L — ABNORMAL LOW (ref 101–111)
Creatinine, Ser: 0.84 mg/dL (ref 0.44–1.00)
GLUCOSE: 89 mg/dL (ref 65–99)
Potassium: 3.2 mmol/L — ABNORMAL LOW (ref 3.5–5.1)
SODIUM: 138 mmol/L (ref 135–145)

## 2015-07-30 NOTE — Progress Notes (Signed)
Quick Note:  Pre-op labs for EGD reviewed. Looks like she has chronic mild hypokalemia. Can we please copy this to PCP. Looks like they are in epic. Appears to be at her baseline (most recent 3.2, ranging 3-3.3). ______

## 2015-08-04 ENCOUNTER — Ambulatory Visit (HOSPITAL_COMMUNITY)
Admission: RE | Admit: 2015-08-04 | Discharge: 2015-08-04 | Disposition: A | Discharge status: Home / Self Care | Payer: Medicare Other | Source: Ambulatory Visit | Attending: Internal Medicine | Admitting: Internal Medicine

## 2015-08-04 ENCOUNTER — Encounter (HOSPITAL_COMMUNITY)
Admission: RE | Disposition: A | Discharge status: Home / Self Care | Payer: Self-pay | Source: Ambulatory Visit | Attending: Internal Medicine

## 2015-08-04 ENCOUNTER — Encounter (HOSPITAL_COMMUNITY): Payer: Self-pay | Admitting: *Deleted

## 2015-08-04 ENCOUNTER — Ambulatory Visit (HOSPITAL_COMMUNITY): Payer: Medicare Other | Admitting: Anesthesiology

## 2015-08-04 DIAGNOSIS — R1312 Dysphagia, oropharyngeal phase: Secondary | ICD-10-CM | POA: Insufficient documentation

## 2015-08-04 DIAGNOSIS — F419 Anxiety disorder, unspecified: Secondary | ICD-10-CM | POA: Insufficient documentation

## 2015-08-04 DIAGNOSIS — K222 Esophageal obstruction: Secondary | ICD-10-CM | POA: Diagnosis not present

## 2015-08-04 DIAGNOSIS — Z85118 Personal history of other malignant neoplasm of bronchus and lung: Secondary | ICD-10-CM | POA: Insufficient documentation

## 2015-08-04 DIAGNOSIS — J45909 Unspecified asthma, uncomplicated: Secondary | ICD-10-CM | POA: Diagnosis not present

## 2015-08-04 DIAGNOSIS — F329 Major depressive disorder, single episode, unspecified: Secondary | ICD-10-CM | POA: Diagnosis not present

## 2015-08-04 DIAGNOSIS — Z87891 Personal history of nicotine dependence: Secondary | ICD-10-CM | POA: Insufficient documentation

## 2015-08-04 DIAGNOSIS — E876 Hypokalemia: Secondary | ICD-10-CM | POA: Insufficient documentation

## 2015-08-04 DIAGNOSIS — K219 Gastro-esophageal reflux disease without esophagitis: Secondary | ICD-10-CM

## 2015-08-04 DIAGNOSIS — R1310 Dysphagia, unspecified: Secondary | ICD-10-CM

## 2015-08-04 DIAGNOSIS — K449 Diaphragmatic hernia without obstruction or gangrene: Secondary | ICD-10-CM | POA: Diagnosis not present

## 2015-08-04 DIAGNOSIS — I10 Essential (primary) hypertension: Secondary | ICD-10-CM | POA: Diagnosis not present

## 2015-08-04 DIAGNOSIS — G4730 Sleep apnea, unspecified: Secondary | ICD-10-CM | POA: Diagnosis not present

## 2015-08-04 HISTORY — PX: MALONEY DILATION: SHX5535

## 2015-08-04 HISTORY — PX: ESOPHAGOGASTRODUODENOSCOPY (EGD) WITH PROPOFOL: SHX5813

## 2015-08-04 SURGERY — ESOPHAGOGASTRODUODENOSCOPY (EGD) WITH PROPOFOL
Anesthesia: Monitor Anesthesia Care

## 2015-08-04 MED ORDER — LIDOCAINE VISCOUS 2 % MT SOLN
OROMUCOSAL | Status: AC
  Filled 2015-08-04: qty 15

## 2015-08-04 MED ORDER — ONDANSETRON HCL 4 MG/2ML IJ SOLN
INTRAMUSCULAR | Status: AC
  Filled 2015-08-04: qty 2

## 2015-08-04 MED ORDER — FENTANYL CITRATE (PF) 100 MCG/2ML IJ SOLN: 25.0000 ug | INTRAMUSCULAR | Status: DC | PRN

## 2015-08-04 MED ORDER — PROPOFOL 500 MG/50ML IV EMUL
INTRAVENOUS | Status: DC | PRN
  Administered 2015-08-04: 125 ug/kg/min via INTRAVENOUS

## 2015-08-04 MED ORDER — LIDOCAINE HCL (PF) 1 % IJ SOLN
INTRAMUSCULAR | Status: AC
  Filled 2015-08-04: qty 5

## 2015-08-04 MED ORDER — FENTANYL CITRATE (PF) 100 MCG/2ML IJ SOLN
25.0000 ug | INTRAMUSCULAR | Status: AC | PRN
  Administered 2015-08-04 (×2): 25 ug via INTRAVENOUS

## 2015-08-04 MED ORDER — ONDANSETRON HCL 4 MG/2ML IJ SOLN
4.0000 mg | Freq: Once | INTRAMUSCULAR | Status: AC
  Administered 2015-08-04: 4 mg via INTRAVENOUS

## 2015-08-04 MED ORDER — MIDAZOLAM HCL 2 MG/2ML IJ SOLN
1.0000 mg | INTRAMUSCULAR | Status: DC | PRN
  Administered 2015-08-04: 2 mg via INTRAVENOUS

## 2015-08-04 MED ORDER — ONDANSETRON HCL 4 MG/2ML IJ SOLN: 4.0000 mg | Freq: Once | INTRAMUSCULAR | Status: DC | PRN

## 2015-08-04 MED ORDER — LACTATED RINGERS IV SOLN
INTRAVENOUS | Status: DC
  Administered 2015-08-04: 07:00:00 via INTRAVENOUS

## 2015-08-04 MED ORDER — PROPOFOL 10 MG/ML IV BOLUS
INTRAVENOUS | Status: DC | PRN
  Administered 2015-08-04 (×2): 10 mg via INTRAVENOUS

## 2015-08-04 MED ORDER — MIDAZOLAM HCL 2 MG/2ML IJ SOLN
INTRAMUSCULAR | Status: AC
  Filled 2015-08-04: qty 2

## 2015-08-04 MED ORDER — PROPOFOL 10 MG/ML IV BOLUS
INTRAVENOUS | Status: AC
  Filled 2015-08-04: qty 40

## 2015-08-04 MED ORDER — MIDAZOLAM HCL 2 MG/2ML IJ SOLN
INTRAMUSCULAR | Status: AC
  Filled 2015-08-04: qty 4

## 2015-08-04 MED ORDER — LIDOCAINE VISCOUS 2 % MT SOLN
5.0000 mL | Freq: Once | OROMUCOSAL | Status: AC
  Administered 2015-08-04: 5 mL via OROMUCOSAL

## 2015-08-04 MED ORDER — FENTANYL CITRATE (PF) 100 MCG/2ML IJ SOLN
INTRAMUSCULAR | Status: AC
  Filled 2015-08-04: qty 2

## 2015-08-04 NOTE — Op Note (Signed)
[facility] Patient Name: [name] Procedure Date: 08/04/2015 7:16 AM MRN: [id number] Date of Birth: [birth year] Attending MD: Norvel Richards , MD CSN: [id number] Age: 55 Admit Type: Outpatient Procedure:                Upper GI endoscopy with maloney dilation Indications:              Heartburn; dysphagia Providers:                Norvel Richards, MD, Otis Peak B. Sharon Seller, RN,                            Randa Spike, Technician Referring MD:              Medicines:                Propofol per Anesthesia Complications:            No immediate complications. Estimated Blood Loss:     Estimated blood loss: none. Estimated blood loss:                            none. Procedure:                Pre-Anesthesia Assessment:                           - Prior to the procedure, a History and Physical                            was performed, and patient medications and                            allergies were reviewed. The patient's tolerance of                            previous anesthesia was also reviewed. The risks                            and benefits of the procedure and the sedation                            options and risks were discussed with the patient.                            All questions were answered, and informed consent                            was obtained. Prior Anticoagulants: The patient has                            taken no previous anticoagulant or antiplatelet                            agents. After reviewing the risks and benefits, the  patient was deemed in satisfactory condition to                            undergo the procedure.                           After obtaining informed consent, the endoscope was                            passed under direct vision. Throughout the                            procedure, the patient's blood pressure, pulse, and                            oxygen saturations were  monitored continuously. The                            EG-[id number] ([id number]) scope was introduced through the                            mouth, and advanced to the second part of duodenum.                            The upper GI endoscopy was accomplished without                            difficulty. The patient tolerated the procedure                            well. Scope In: 7:39:55 AM Scope Out: 7:59:59 AM Total Procedure Duration: 0 hours 20 minutes 4 seconds  Findings:      A mild Schatzki ring (acquired) was found at the gastroesophageal       junction. Esophageal mucosa appeared normal otherwise.      A small hiatal hernia was present.      The exam was otherwise without abnormality.      The second portion of the duodenum was normal. The scope was withdrawn.       Dilation was performed with a Maloney dilator with mild resistance at 14       Fr. The dilation site was examined following endoscope reinsertion and       showed mild improvement in luminal narrowing. Estimated blood loss: none. Impression:               - Mild Schatzki ring. Dilated.                           - Small hiatal hernia.                           - The examination was otherwise normal.                           - Normal second portion of the duodenum.                           -  No specimens collected. Moderate Sedation:      Moderate (conscious) sedation was personally administered by an       anesthesia professional. The following parameters were monitored: oxygen       saturation, heart rate, blood pressure, respiratory rate, EKG, adequacy       of pulmonary ventilation, and response to care. Total physician       intraservice time was 25 minutes. Recommendation:           - Patient has a contact number available for                            emergencies. The signs and symptoms of potential                            delayed complications were discussed with the                            patient.  Return to normal activities tomorrow.                            Written discharge instructions were provided to the                            patient.                           - Advance diet as tolerated.                           - Continue present medications. Procedure Code(s):        --- Professional ---                           [phone], Esophagogastroduodenoscopy, flexible,                            transoral; diagnostic, including collection of                            specimen(s) by brushing or washing, when performed                            (separate procedure)                           43450, Dilation of esophagus, by unguided sound or                            bougie, single or multiple passes Diagnosis Code(s):        --- Professional ---                           K22.2, Esophageal obstruction                           K44.9, Diaphragmatic hernia without obstruction or  gangrene                           R12, Heartburn CPT copyright 2016 American Medical Association. All rights reserved. The codes documented in this report are preliminary and upon coder review may  be revised to meet current compliance requirements. [name]. Ysenia Filice, MD Norvel Richards, MD 08/04/2015 8:29:26 AM This report has been signed electronically. Number of Addenda: 0

## 2015-08-04 NOTE — Anesthesia Postprocedure Evaluation (Signed)
Anesthesia Post Note  Patient: [name]  Procedure(s) Performed: Procedure(s) (LRB): ESOPHAGOGASTRODUODENOSCOPY (EGD) WITH PROPOFOL (N/A) MALONEY DILATION (N/A)  Patient location during evaluation: Short Stay Anesthesia Type: MAC Level of consciousness: awake and alert and oriented Pain management: pain level controlled Vital Signs Assessment: post-procedure vital signs reviewed and stable Respiratory status: spontaneous breathing Cardiovascular status: blood pressure returned to baseline Postop Assessment: no signs of nausea or vomiting Anesthetic complications: no    Last Vitals:  Filed Vitals:   08/04/15 0830 08/04/15 0835  BP: 134/68 126/63  Pulse: 80 75  Temp:  36.6 C  Resp: 17 16    Last Pain: There were no vitals filed for this visit.               Elizibeth Breau

## 2015-08-04 NOTE — Discharge Instructions (Signed)
EGD Discharge instructions Please read the instructions outlined below and refer to this sheet in the next few weeks. These discharge instructions provide you with general information on caring for yourself after you leave the hospital. Your doctor may also give you specific instructions. While your treatment has been planned according to the most current medical practices available, unavoidable complications occasionally occur. If you have any problems or questions after discharge, please call your doctor. ACTIVITY  You may resume your regular activity but move at a slower pace for the next 24 hours.   Take frequent rest periods for the next 24 hours.   Walking will help expel (get rid of) the air and reduce the bloated feeling in your abdomen.   No driving for 24 hours (because of the anesthesia (medicine) used during the test).   You may shower.   Do not sign any important legal documents or operate any machinery for 24 hours (because of the anesthesia used during the test).  NUTRITION  Drink plenty of fluids.   You may resume your normal diet.   Begin with a light meal and progress to your normal diet.   Avoid alcoholic beverages for 24 hours or as instructed by your caregiver.  MEDICATIONS  You may resume your normal medications unless your caregiver tells you otherwise.  WHAT YOU CAN EXPECT TODAY  You may experience abdominal discomfort such as a feeling of fullness or gas pains.  FOLLOW-UP  Your doctor will discuss the results of your test with you.  SEEK IMMEDIATE MEDICAL ATTENTION IF ANY OF THE FOLLOWING OCCUR:  Excessive nausea (feeling sick to your stomach) and/or vomiting.   Severe abdominal pain and distention (swelling).   Trouble swallowing.   Temperature over 101 F (37.8 C).   Rectal bleeding or vomiting of blood.    GERD information provided  Continue Dexilant 60 mg daily  Office visit with [place] in 3 months.     FOLLOW UP APPOINTMENT 11-05-2015 AT  8:00 AM.     Gastroesophageal Reflux Disease, Adult Normally, food travels down the esophagus and stays in the stomach to be digested. However, when a person has gastroesophageal reflux disease (GERD), food and stomach acid move back up into the esophagus. When this happens, the esophagus becomes sore and inflamed. Over time, GERD can create small holes (ulcers) in the lining of the esophagus.  CAUSES This condition is caused by a problem with the muscle between the esophagus and the stomach (lower esophageal sphincter, or LES). Normally, the LES muscle closes after food passes through the esophagus to the stomach. When the LES is weakened or abnormal, it does not close properly, and that allows food and stomach acid to go back up into the esophagus. The LES can be weakened by certain dietary substances, medicines, and medical conditions, including:  Tobacco use.  Pregnancy.  Having a hiatal hernia.  Heavy alcohol use.  Certain foods and beverages, such as coffee, chocolate, onions, and peppermint. RISK FACTORS This condition is more likely to develop in:  People who have an increased body weight.  People who have connective tissue disorders.  People who use NSAID medicines. SYMPTOMS Symptoms of this condition include:  Heartburn.  Difficult or painful swallowing.  The feeling of having a lump in the throat.  Abitter taste in the mouth.  Bad breath.  Having a large amount of saliva.  Having an upset or bloated stomach.  Belching.  Chest pain.  Shortness of breath or wheezing.  Ongoing (chronic)  cough or a night-time cough.  Wearing away of tooth enamel.  Weight loss. Different conditions can cause chest pain. Make sure to see your health care provider if you experience chest pain. DIAGNOSIS Your health care provider will take a medical history and perform a physical exam. To determine if you have mild or severe GERD, your health care provider may also monitor  how you respond to treatment. You may also have other tests, including:  An endoscopy toexamine your stomach and esophagus with a small camera.  A test thatmeasures the acidity level in your esophagus.  A test thatmeasures how much pressure is on your esophagus.  A barium swallow or modified barium swallow to show the shape, size, and functioning of your esophagus. TREATMENT The goal of treatment is to help relieve your symptoms and to prevent complications. Treatment for this condition may vary depending on how severe your symptoms are. Your health care provider may recommend:  Changes to your diet.  Medicine.  Surgery. HOME CARE INSTRUCTIONS Diet  Follow a diet as recommended by your health care provider. This may involve avoiding foods and drinks such as:  Coffee and tea (with or without caffeine).  Drinks that containalcohol.  Energy drinks and sports drinks.  Carbonated drinks or sodas.  Chocolate and cocoa.  Peppermint and mint flavorings.  Garlic and onions.  Horseradish.  Spicy and acidic foods, including peppers, chili powder, curry powder, vinegar, hot sauces, and barbecue sauce.  Citrus fruit juices and citrus fruits, such as oranges, lemons, and limes.  Tomato-based foods, such as red sauce, chili, salsa, and pizza with red sauce.  Fried and fatty foods, such as donuts, french fries, potato chips, and high-fat dressings.  High-fat meats, such as hot dogs and fatty cuts of red and white meats, such as rib eye steak, sausage, ham, and bacon.  High-fat dairy items, such as whole milk, butter, and cream cheese.  Eat small, frequent meals instead of large meals.  Avoid drinking large amounts of liquid with your meals.  Avoid eating meals during the 2-3 hours before bedtime.  Avoid lying down right after you eat.  Do not exercise right after you eat. General Instructions  Pay attention to any changes in your symptoms.  Take over-the-counter  and prescription medicines only as told by your health care provider. Do not take aspirin, ibuprofen, or other NSAIDs unless your health care provider told you to do so.  Do not use any tobacco products, including cigarettes, chewing tobacco, and e-cigarettes. If you need help quitting, ask your health care provider.  Wear loose-fitting clothing. Do not wear anything tight around your waist that causes pressure on your abdomen.  Raise (elevate) the head of your bed 6 inches (15cm).  Try to reduce your stress, such as with yoga or meditation. If you need help reducing stress, ask your health care provider.  If you are overweight, reduce your weight to an amount that is healthy for you. Ask your health care provider for guidance about a safe weight loss goal.  Keep all follow-up visits as told by your health care provider. This is important. SEEK MEDICAL CARE IF:  You have new symptoms.  You have unexplained weight loss.  You have difficulty swallowing, or it hurts to swallow.  You have wheezing or a persistent cough.  Your symptoms do not improve with treatment.  You have a hoarse voice. SEEK IMMEDIATE MEDICAL CARE IF:  You have pain in your arms, neck, jaw, teeth, or back.  You feel sweaty, dizzy, or light-headed.  You have chest pain or shortness of breath.  You vomit and your vomit looks like blood or coffee grounds.  You faint.  Your stool is bloody or black.  You cannot swallow, drink, or eat.   This information is not intended to replace advice given to you by your health care provider. Make sure you discuss any questions you have with your health care provider.   Document Released: 10/14/2004 Document Revised: 09/25/2014 Document Reviewed: 05/01/2014 Elsevier Interactive Patient Education 2016 [employer]  PATIENT INSTRUCTIONS POST-ANESTHESIA  IMMEDIATELY FOLLOWING SURGERY:  Do not drive or operate machinery for the first twenty four hours after surgery.  Do  not make any important decisions for twenty four hours after surgery or while taking narcotic pain medications or sedatives.  If you develop intractable nausea and vomiting or a severe headache please notify your doctor immediately.  FOLLOW-UP:  Please make an appointment with your surgeon as instructed. You do not need to follow up with anesthesia unless specifically instructed to do so.  WOUND CARE INSTRUCTIONS (if applicable):  Keep a dry clean dressing on the anesthesia/puncture wound site if there is drainage.  Once the wound has quit draining you may leave it open to air.  Generally you should leave the bandage intact for twenty four hours unless there is drainage.  If the epidural site drains for more than 36-48 hours please call the anesthesia department.  QUESTIONS?:  Please feel free to call your physician or the hospital operator if you have any questions, and they will be happy to assist you.

## 2015-08-04 NOTE — Anesthesia Preprocedure Evaluation (Signed)
Anesthesia Evaluation  Patient identified by MRN, date of birth, ID band Patient awake    Reviewed: Allergy & Precautions, NPO status , Patient's Chart, lab work & pertinent test results  Airway Mallampati: III  TM Distance: >3 FB   Mouth opening: Limited Mouth Opening  Dental  (+) Edentulous Upper, Poor Dentition, Loose, Dental Advisory Given,    Pulmonary shortness of breath, asthma , sleep apnea , former smoker,  L Lung CA - lobectomy    breath sounds clear to auscultation       Cardiovascular hypertension, Pt. on medications  Rhythm:Regular Rate:Normal     Neuro/Psych PSYCHIATRIC DISORDERS Anxiety Depression    GI/Hepatic GERD  ,  Endo/Other    Renal/GU      Musculoskeletal   Abdominal   Peds  Hematology   Anesthesia Other Findings   Reproductive/Obstetrics                             Anesthesia Physical Anesthesia Plan  ASA: III  Anesthesia Plan: MAC   Post-op Pain Management:    Induction: Intravenous  Airway Management Planned: Simple Face Mask  Additional Equipment:   Intra-op Plan:   Post-operative Plan:   Informed Consent: I have reviewed the patients History and Physical, chart, labs and discussed the procedure including the risks, benefits and alternatives for the proposed anesthesia with the patient or authorized representative who has indicated his/her understanding and acceptance.     Plan Discussed with:   Anesthesia Plan Comments:         Anesthesia Quick Evaluation

## 2015-08-04 NOTE — Transfer of Care (Signed)
Immediate Anesthesia Transfer of Care Note  Patient: [name]  Procedure(s) Performed: Procedure(s) with comments: ESOPHAGOGASTRODUODENOSCOPY (EGD) WITH PROPOFOL (N/A) - [place] (N/A)  Patient Location: PACU  Anesthesia Type:MAC  Level of Consciousness: awake  Airway & Oxygen Therapy: Patient Spontanous Breathing  Post-op Assessment: Report given to RN  Post vital signs: Reviewed  Last Vitals:  Filed Vitals:   08/04/15 0730 08/04/15 0810  BP: 130/62   Pulse:    Temp:    Resp: 26 15    Last Pain: There were no vitals filed for this visit.    Patients Stated Pain Goal: 10 (27/25/36 6440)  Complications: No apparent anesthesia complications

## 2015-08-04 NOTE — Interval H&P Note (Signed)
History and Physical Interval Note:  08/04/2015 7:21 AM  [name]  has presented today for surgery, with the diagnosis of dysphagia  The various methods of treatment have been discussed with the patient and family. After consideration of risks, benefits and other options for treatment, the patient has consented to  Procedure(s) with comments: ESOPHAGOGASTRODUODENOSCOPY (EGD) WITH PROPOFOL (N/A) - [address] (N/A) as a surgical intervention .  The patient's history has been reviewed, patient examined, no change in status, stable for surgery.  I have reviewed the patient's chart and labs.  Questions were answered to the patient's satisfaction.     [name]  No change. EGD with ED as  Feasible/appropriate.  The risks, benefits, limitations, alternatives and imponderables have been reviewed with the patient. Potential for esophageal dilation, biopsy, etc. have also been reviewed.  Questions have been answered. All parties agreeable.

## 2015-08-04 NOTE — H&P (View-Only) (Signed)
Quick Note:  Pre-op labs for EGD reviewed. Looks like she has chronic mild hypokalemia. Can we please copy this to PCP. Looks like they are in epic. Appears to be at her baseline (most recent 3.2, ranging 3-3.3). ______

## 2015-08-08 ENCOUNTER — Encounter (HOSPITAL_COMMUNITY): Payer: Self-pay | Admitting: Internal Medicine

## 2015-08-25 ENCOUNTER — Other Ambulatory Visit: Payer: Self-pay | Admitting: Family Medicine

## 2015-08-25 DIAGNOSIS — E7800 Pure hypercholesterolemia, unspecified: Secondary | ICD-10-CM

## 2015-08-29 ENCOUNTER — Encounter: Payer: Self-pay | Admitting: Gastroenterology

## 2015-08-29 ENCOUNTER — Ambulatory Visit (INDEPENDENT_AMBULATORY_CARE_PROVIDER_SITE_OTHER): Payer: Medicare Other | Admitting: Gastroenterology

## 2015-08-29 VITALS — BP 141/80 | HR 84 | Temp 98.1°F | Ht 64.0 in | Wt 170.6 lb

## 2015-08-29 DIAGNOSIS — R1312 Dysphagia, oropharyngeal phase: Secondary | ICD-10-CM | POA: Diagnosis not present

## 2015-08-29 NOTE — Patient Instructions (Addendum)
Continue taking Dexilant each morning. Take Zantac each evening as needed at bedtime. It works best when only taken as needed.  Please review the reflux instructions below.  I have referred you to an Ear, Nose, and Throat Specialist.  I have also referred you to a Speech Pathologist.   Gastroesophageal Reflux Disease, Adult Normally, food travels down the esophagus and stays in the stomach to be digested. However, when a person has gastroesophageal reflux disease (GERD), food and stomach acid move back up into the esophagus. When this happens, the esophagus becomes sore and inflamed. Over time, GERD can create small holes (ulcers) in the lining of the esophagus.  CAUSES This condition is caused by a problem with the muscle between the esophagus and the stomach (lower esophageal sphincter, or LES). Normally, the LES muscle closes after food passes through the esophagus to the stomach. When the LES is weakened or abnormal, it does not close properly, and that allows food and stomach acid to go back up into the esophagus. The LES can be weakened by certain dietary substances, medicines, and medical conditions, including:  Tobacco use.  Pregnancy.  Having a hiatal hernia.  Heavy alcohol use.  Certain foods and beverages, such as coffee, chocolate, onions, and peppermint. RISK FACTORS This condition is more likely to develop in:  People who have an increased body weight.  People who have connective tissue disorders.  People who use NSAID medicines. SYMPTOMS Symptoms of this condition include:  Heartburn.  Difficult or painful swallowing.  The feeling of having a lump in the throat.  Abitter taste in the mouth.  Bad breath.  Having a large amount of saliva.  Having an upset or bloated stomach.  Belching.  Chest pain.  Shortness of breath or wheezing.  Ongoing (chronic) cough or a night-time cough.  Wearing away of tooth enamel.  Weight loss. Different conditions  can cause chest pain. Make sure to see your health care provider if you experience chest pain. DIAGNOSIS Your health care provider will take a medical history and perform a physical exam. To determine if you have mild or severe GERD, your health care provider may also monitor how you respond to treatment. You may also have other tests, including:  An endoscopy toexamine your stomach and esophagus with a small camera.  A test thatmeasures the acidity level in your esophagus.  A test thatmeasures how much pressure is on your esophagus.  A barium swallow or modified barium swallow to show the shape, size, and functioning of your esophagus. TREATMENT The goal of treatment is to help relieve your symptoms and to prevent complications. Treatment for this condition may vary depending on how severe your symptoms are. Your health care provider may recommend:  Changes to your diet.  Medicine.  Surgery. HOME CARE INSTRUCTIONS Diet  Follow a diet as recommended by your health care provider. This may involve avoiding foods and drinks such as:  Coffee and tea (with or without caffeine).  Drinks that containalcohol.  Energy drinks and sports drinks.  Carbonated drinks or sodas.  Chocolate and cocoa.  Peppermint and mint flavorings.  Garlic and onions.  Horseradish.  Spicy and acidic foods, including peppers, chili powder, curry powder, vinegar, hot sauces, and barbecue sauce.  Citrus fruit juices and citrus fruits, such as oranges, lemons, and limes.  Tomato-based foods, such as red sauce, chili, salsa, and pizza with red sauce.  Fried and fatty foods, such as donuts, french fries, potato chips, and high-fat dressings.  High-fat  meats, such as hot dogs and fatty cuts of red and white meats, such as rib eye steak, sausage, ham, and bacon.  High-fat dairy items, such as whole milk, butter, and cream cheese.  Eat small, frequent meals instead of large meals.  Avoid drinking  large amounts of liquid with your meals.  Avoid eating meals during the 2-3 hours before bedtime.  Avoid lying down right after you eat.  Do not exercise right after you eat. General Instructions  Pay attention to any changes in your symptoms.  Take over-the-counter and prescription medicines only as told by your health care provider. Do not take aspirin, ibuprofen, or other NSAIDs unless your health care provider told you to do so.  Do not use any tobacco products, including cigarettes, chewing tobacco, and e-cigarettes. If you need help quitting, ask your health care provider.  Wear loose-fitting clothing. Do not wear anything tight around your waist that causes pressure on your abdomen.  Raise (elevate) the head of your bed 6 inches (15cm).  Try to reduce your stress, such as with yoga or meditation. If you need help reducing stress, ask your health care provider.  If you are overweight, reduce your weight to an amount that is healthy for you. Ask your health care provider for guidance about a safe weight loss goal.  Keep all follow-up visits as told by your health care provider. This is important. SEEK MEDICAL CARE IF:  You have new symptoms.  You have unexplained weight loss.  You have difficulty swallowing, or it hurts to swallow.  You have wheezing or a persistent cough.  Your symptoms do not improve with treatment.  You have a hoarse voice. SEEK IMMEDIATE MEDICAL CARE IF:  You have pain in your arms, neck, jaw, teeth, or back.  You feel sweaty, dizzy, or light-headed.  You have chest pain or shortness of breath.  You vomit and your vomit looks like blood or coffee grounds.  You faint.  Your stool is bloody or black.  You cannot swallow, drink, or eat.   This information is not intended to replace advice given to you by your health care provider. Make sure you discuss any questions you have with your health care provider.   Document Released: 10/14/2004  Document Revised: 09/25/2014 Document Reviewed: 05/01/2014 Elsevier Interactive Patient Education [employer].

## 2015-08-29 NOTE — Assessment & Plan Note (Signed)
55 year old female with EGD about a month ago revealing mild Schatzki's ring s/p dilation, now with recurrent reflux, hoarseness, and what appears to be more of an oropharyngeal dysphagia, reporting food and liquids going "up her nose" and "the wrong way". BPE on file from last year. Symptoms concerning. Will arrange for both an ENT evaluation and Speech evaluation (MBSS) expeditiously. Discussed GERD dietary and behavior precautions in detail today as well. Continue Dexilant once daily and Zantac as needed in the evening. Further recommendations after Speech and ENT.

## 2015-08-29 NOTE — Progress Notes (Signed)
Referring Provider: Sydnee Levans* Primary Care Physician:  Adin Hector, MD Primary GI: Dr. Gala Romney   Chief Complaint  Patient presents with  . Dysphagia    hoarseness    HPI:   [name] is a 55 y.o. female presenting today with a history of chronic constipation, GERD, dysphagia. Multiple dilations in past, most recently in July 2017 of mild Schatzki's ring. History of chronically elevated alk phos with serologic markers negative for PBC. Hep BsAg and Hep C antibody negative. GGT normal. Normal transaminases. Last US abdomen Feb 2016 with mild fatty liver. Following LFTs yearly.   She returns today stating hoarseness has returned. At last visit prior to EGD, she had noted liquid and food going into her nose, "going the wrong way". States she feels things are going down the wrong pipe. Whispering today in clinic. States she has a lot of phlegm. Throat is sore, hoarse. Gagging and coughing a lot. Denies esophageal dysphagia. Soft foods sometimes easier to tolerate, sometimes note. BPE on file from Feb 2016 with normal esophagus, no laryngeal penetration or aspiration. States sometimes when walking she feels like she is leaning to the right.   Past Medical History:  Diagnosis Date  . Anxiety   . Asthma    every now and then.  Wheezing and coughing  . Cancer ([place]) 1997   left lung  . Chronic abdominal pain   . Chronic back pain   . Chronic joint pain   . Depression   . GERD (gastroesophageal reflux disease)   . HLD (hyperlipidemia)   . HTN (hypertension)   . Nausea and vomiting    chronic, recurrent  . Osteoarthrosis involving more than one site but not generalized   . Palpitations 01/2013   chronic, intermittent  . Shortness of breath dyspnea    with exertion    Past Surgical History:  Procedure Laterality Date  . ABDOMINAL HYSTERECTOMY    . ABDOMINAL SURGERY     ovarian cyst removal  . BIOPSY N/A 11/22/2013   Procedure: GASTRIC BIOPSY;  Surgeon:  Daneil Dolin, MD;  Location: [facility];  Service: Endoscopy;  Laterality: N/A;  . BLADDER SURGERY  suspension x4   x 4  . CARPAL TUNNEL RELEASE Right 07/11/2015   Procedure: RIGHT CARPAL TUNNEL RELEASE;  Surgeon: Jessy Oto, MD;  Location: [facility];  Service: Orthopedics;  Laterality: Right;  . CHOLECYSTECTOMY N/A 04/05/2014   Procedure: LAPAROSCOPIC CHOLECYSTECTOMY;  Surgeon: Aviva Signs Md, MD;  Location: [facility];  Service: General;  Laterality: N/A;  . COCCYGECTOMY N/A 04/11/2015   Procedure: COCCYGECTOMY WITH OSTEOTOMY THROUGH AREA OF DEFORMITY;  Surgeon: Jessy Oto, MD;  Location: [facility];  Service: Orthopedics;  Laterality: N/A;  . COLONOSCOPY    . COLONOSCOPY WITH PROPOFOL N/A 11/22/2013   Dr. Gala Romney: Grade 3 and 4/internal hemorrhoids?"likely source of hematochezia Normal colonoscopy otherwise.   . ESOPHAGOGASTRODUODENOSCOPY (EGD) WITH PROPOFOL N/A 11/22/2013   Dr. Gala Romney: Incomplete Schatzki's ring dilated and disrupted as described above.  Small hiatal hernia. Focally abnormal gastric mucosa of uncertain significance status post biopsy, reactive gastropathy, negative H.pylori  . ESOPHAGOGASTRODUODENOSCOPY (EGD) WITH PROPOFOL N/A 08/04/2015   Dr. Gala Romney: mild Schatzki's ring s/p dilation, small hiatal hernia   . EXCISION VAGINAL CYST Left 06/20/2012   Procedure: EXCISION LEFT LABIAL CYST;  Surgeon: Jonnie Kind, MD;  Location: [facility];  Service: Gynecology;  Laterality: Left;  . LOBECTOMY Left   . LUNG CANCER SURGERY  54   age 70, resection only  . MALONEY DILATION N/A 11/22/2013   Procedure: [name] DILATION;  Surgeon: Daneil Dolin, MD;  Location: [facility];  Service: Endoscopy;  Laterality: N/A;  56  . MALONEY DILATION N/A 08/04/2015   Procedure: [name] DILATION;  Surgeon: Daneil Dolin, MD;  [facility];  Service: Endoscopy;  Laterality: N/A;  . MASS EXCISION N/A 07/23/2014   Procedure: EXCISIONAL BIOPSY NODULE LEFT PARACOCCYGEAL AREA;  Surgeon: Jessy Oto, MD;  Location: [facility];  Service: Orthopedics;  Laterality: N/A;  . TUBAL LIGATION      Current Outpatient Prescriptions  Medication Sig Dispense Refill  . acyclovir (ZOVIRAX) 400 MG tablet TAKE ONE TABLET BY MOUTH TWICE DAILY 60 tablet 5  . albuterol (PROAIR HFA) 108 (90 BASE) MCG/ACT inhaler Inhale 2 puffs into the lungs every 4 (four) hours as needed. for shortness of breath 1 each 2  . AMITIZA 24 MCG capsule TAKE ONE CAPSULE BY MOUTH TWICE DAILY WITH MEALS 60 capsule 5  . amitriptyline (ELAVIL) 150 MG tablet TAKE ONE TABLET BY MOUTH ONCE DAILY WITH BREAKFAST 30 tablet 5  . atorvastatin (LIPITOR) 40 MG tablet TAKE ONE TABLET BY MOUTH ONCE DAILY 30 tablet 5  . Calcium Citrate-Vitamin D 250-200 MG-UNIT TABS Take 1 tablet by mouth daily.     Marland Kitchen DEXILANT 60 MG capsule TAKE ONE CAPSULE BY MOUTH ONCE DAILY 30 capsule 11  . docusate sodium (COLACE) 100 MG capsule Take 2 capsules (200 mg total) by mouth 2 (two) times daily. 100 capsule 0  . DULoxetine (CYMBALTA) 60 MG capsule Take 1 capsule (60 mg total) by mouth 2 (two) times daily. 120 capsule 11  . gabapentin (NEURONTIN) 300 MG capsule TAKE ONE CAPSULE BY MOUTH THREE TIMES DAILY 90 capsule 2  . hydrochlorothiazide (HYDRODIURIL) 25 MG tablet Take 1 tablet (25 mg total) by mouth daily. 90 tablet 2  . HYDROcodone-acetaminophen (NORCO) 5-325 MG tablet Take 1-2 tablets by mouth every 6 (six) hours as needed for moderate pain. MAXIMUM TOTAL ACETAMINOPHEN DOSE IS 4000 MG PER DAY 60 tablet 0   No current facility-administered medications for this visit.     Allergies as of 08/29/2015 - Review Complete 08/29/2015  Allergen Reaction Noted  . Promethazine hcl Nausea And Vomiting and Other (See Comments) 07/26/2008    Family History  Problem Relation Age of Onset  . Diabetes Other   . Heart disease Other     has Psychologist, forensic  . Hypertension Mother   . Kidney disease Mother   . Hypertension Father   . Kidney disease Father   . Heart disease  Father   . Cancer Father     leukemia  . Hypertension Sister   . Hypertension Sister   . Colon cancer Neg Hx     Social History   Social History  . Marital status: Married    Spouse name: N/A  . Number of children: 3  . Years of education: N/A   Social History Main Topics  . Smoking status: Former Smoker    Packs/day: 1.50    Years: 25.00    Types: Cigarettes    Quit date: 04/07/1995  . Smokeless tobacco: Never Used  . Alcohol use No  . Drug use: No  . Sexual activity: Not Currently    Birth control/ protection: Surgical   Other Topics Concern  . None   Social History Narrative  . None    Review of Systems: As noted in  HPI   Physical Exam: BP (!) 141/80   Pulse 84   Temp 98.1 F (36.7 C) (Oral)   Ht '5\' 4"'  (1.626 m)   Wt 170 lb 9.6 oz (77.4 kg)   BMI 29.28 kg/m  General:   Alert and oriented. No distress noted. Pleasant and cooperative.  Head:  Normocephalic and atraumatic. Eyes:  Conjuctiva clear without scleral icterus. Neck:  Without cervical adenopathy or thyromegaly  Abdomen:  +BS, soft, non-tender and non-distended. No rebound or guarding. No HSM or masses noted. Extremities:  Without edema. Neurologic:  Alert and  oriented x4 Psych:  Alert and cooperative. Normal mood and affect.

## 2015-09-01 ENCOUNTER — Other Ambulatory Visit: Payer: Self-pay

## 2015-09-01 DIAGNOSIS — R1310 Dysphagia, unspecified: Secondary | ICD-10-CM

## 2015-09-01 NOTE — Progress Notes (Signed)
cc'ed to pcp °

## 2015-09-15 ENCOUNTER — Other Ambulatory Visit: Payer: Self-pay | Admitting: Gastroenterology

## 2015-09-15 DIAGNOSIS — R1310 Dysphagia, unspecified: Secondary | ICD-10-CM

## 2015-09-20 ENCOUNTER — Other Ambulatory Visit: Payer: Self-pay | Admitting: Gastroenterology

## 2015-09-24 ENCOUNTER — Ambulatory Visit (HOSPITAL_COMMUNITY)
Admission: RE | Admit: 2015-09-24 | Discharge: 2015-09-24 | Disposition: A | Discharge status: Home / Self Care | Payer: Medicare Other | Source: Ambulatory Visit | Attending: Gastroenterology | Admitting: Gastroenterology

## 2015-09-24 DIAGNOSIS — I10 Essential (primary) hypertension: Secondary | ICD-10-CM | POA: Diagnosis not present

## 2015-09-24 DIAGNOSIS — K219 Gastro-esophageal reflux disease without esophagitis: Secondary | ICD-10-CM | POA: Insufficient documentation

## 2015-09-24 DIAGNOSIS — F329 Major depressive disorder, single episode, unspecified: Secondary | ICD-10-CM | POA: Insufficient documentation

## 2015-09-24 DIAGNOSIS — F419 Anxiety disorder, unspecified: Secondary | ICD-10-CM | POA: Diagnosis not present

## 2015-09-24 DIAGNOSIS — E785 Hyperlipidemia, unspecified: Secondary | ICD-10-CM | POA: Diagnosis not present

## 2015-09-24 DIAGNOSIS — R490 Dysphonia: Secondary | ICD-10-CM | POA: Insufficient documentation

## 2015-09-24 DIAGNOSIS — R1319 Other dysphagia: Secondary | ICD-10-CM | POA: Insufficient documentation

## 2015-09-24 DIAGNOSIS — R1310 Dysphagia, unspecified: Secondary | ICD-10-CM | POA: Diagnosis not present

## 2015-09-24 DIAGNOSIS — J45909 Unspecified asthma, uncomplicated: Secondary | ICD-10-CM | POA: Diagnosis not present

## 2015-10-01 NOTE — Progress Notes (Signed)
Speech Pathology notes reviewed. She has sensation of nasal regurgitation with all consistencies and barium tablet sticking substernally but oral was managed well. Barium tablet stuck transiently in the pyriform but was able to be moved with a dry swallow. Recommendations: regular diet, meds with puree, no further speech needed. How is patient? Did she see ENT?

## 2015-10-02 ENCOUNTER — Encounter (HOSPITAL_BASED_OUTPATIENT_CLINIC_OR_DEPARTMENT_OTHER): Payer: Self-pay | Admitting: *Deleted

## 2015-10-02 ENCOUNTER — Ambulatory Visit (INDEPENDENT_AMBULATORY_CARE_PROVIDER_SITE_OTHER): Payer: Medicare Other | Admitting: Otolaryngology

## 2015-10-02 DIAGNOSIS — R040 Epistaxis: Secondary | ICD-10-CM | POA: Diagnosis not present

## 2015-10-02 DIAGNOSIS — K219 Gastro-esophageal reflux disease without esophagitis: Secondary | ICD-10-CM

## 2015-10-02 DIAGNOSIS — R1312 Dysphagia, oropharyngeal phase: Secondary | ICD-10-CM

## 2015-10-06 ENCOUNTER — Encounter (HOSPITAL_BASED_OUTPATIENT_CLINIC_OR_DEPARTMENT_OTHER)
Admission: RE | Admit: 2015-10-06 | Discharge: 2015-10-06 | Disposition: A | Discharge status: Home / Self Care | Payer: Medicare Other | Source: Ambulatory Visit | Attending: Specialist | Admitting: Specialist

## 2015-10-06 DIAGNOSIS — R2232 Localized swelling, mass and lump, left upper limb: Secondary | ICD-10-CM | POA: Insufficient documentation

## 2015-10-06 DIAGNOSIS — Z01812 Encounter for preprocedural laboratory examination: Secondary | ICD-10-CM | POA: Diagnosis not present

## 2015-10-06 LAB — BASIC METABOLIC PANEL
Anion gap: 10 (ref 5–15)
BUN: <5 mg/dL — ABNORMAL LOW (ref 6–20)
CALCIUM: 9 mg/dL (ref 8.9–10.3)
CO2: 33 mmol/L — AB (ref 22–32)
CREATININE: 0.85 mg/dL (ref 0.44–1.00)
Chloride: 98 mmol/L — ABNORMAL LOW (ref 101–111)
GFR calc non Af Amer: >60 mL/min (ref >60)
GLUCOSE: 122 mg/dL — AB (ref 65–99)
Potassium: 3 mmol/L — ABNORMAL LOW (ref 3.5–5.1)
Sodium: 141 mmol/L (ref 135–145)

## 2015-10-10 ENCOUNTER — Ambulatory Visit (HOSPITAL_BASED_OUTPATIENT_CLINIC_OR_DEPARTMENT_OTHER): Payer: Medicare Other | Admitting: Anesthesiology

## 2015-10-10 ENCOUNTER — Encounter (HOSPITAL_BASED_OUTPATIENT_CLINIC_OR_DEPARTMENT_OTHER): Payer: Self-pay | Admitting: Anesthesiology

## 2015-10-10 ENCOUNTER — Ambulatory Visit (HOSPITAL_BASED_OUTPATIENT_CLINIC_OR_DEPARTMENT_OTHER)
Admission: RE | Admit: 2015-10-10 | Discharge: 2015-10-10 | Disposition: A | Discharge status: Home / Self Care | Payer: Medicare Other | Source: Ambulatory Visit | Attending: Specialist | Admitting: Specialist

## 2015-10-10 ENCOUNTER — Encounter (HOSPITAL_BASED_OUTPATIENT_CLINIC_OR_DEPARTMENT_OTHER)
Admission: RE | Disposition: A | Discharge status: Home / Self Care | Payer: Self-pay | Source: Ambulatory Visit | Attending: Specialist

## 2015-10-10 DIAGNOSIS — F419 Anxiety disorder, unspecified: Secondary | ICD-10-CM | POA: Diagnosis not present

## 2015-10-10 DIAGNOSIS — J45909 Unspecified asthma, uncomplicated: Secondary | ICD-10-CM | POA: Insufficient documentation

## 2015-10-10 DIAGNOSIS — M1990 Unspecified osteoarthritis, unspecified site: Secondary | ICD-10-CM | POA: Insufficient documentation

## 2015-10-10 DIAGNOSIS — D1722 Benign lipomatous neoplasm of skin and subcutaneous tissue of left arm: Secondary | ICD-10-CM | POA: Insufficient documentation

## 2015-10-10 DIAGNOSIS — R002 Palpitations: Secondary | ICD-10-CM | POA: Insufficient documentation

## 2015-10-10 DIAGNOSIS — R0600 Dyspnea, unspecified: Secondary | ICD-10-CM | POA: Diagnosis not present

## 2015-10-10 DIAGNOSIS — G8929 Other chronic pain: Secondary | ICD-10-CM | POA: Insufficient documentation

## 2015-10-10 DIAGNOSIS — M549 Dorsalgia, unspecified: Secondary | ICD-10-CM | POA: Diagnosis not present

## 2015-10-10 DIAGNOSIS — K219 Gastro-esophageal reflux disease without esophagitis: Secondary | ICD-10-CM | POA: Insufficient documentation

## 2015-10-10 DIAGNOSIS — Z79899 Other long term (current) drug therapy: Secondary | ICD-10-CM | POA: Insufficient documentation

## 2015-10-10 DIAGNOSIS — R109 Unspecified abdominal pain: Secondary | ICD-10-CM | POA: Diagnosis not present

## 2015-10-10 DIAGNOSIS — D2361 Other benign neoplasm of skin of right upper limb, including shoulder: Secondary | ICD-10-CM | POA: Diagnosis not present

## 2015-10-10 DIAGNOSIS — R2232 Localized swelling, mass and lump, left upper limb: Secondary | ICD-10-CM

## 2015-10-10 DIAGNOSIS — Z841 Family history of disorders of kidney and ureter: Secondary | ICD-10-CM | POA: Diagnosis not present

## 2015-10-10 DIAGNOSIS — F329 Major depressive disorder, single episode, unspecified: Secondary | ICD-10-CM | POA: Insufficient documentation

## 2015-10-10 DIAGNOSIS — Z87891 Personal history of nicotine dependence: Secondary | ICD-10-CM | POA: Insufficient documentation

## 2015-10-10 DIAGNOSIS — Z85118 Personal history of other malignant neoplasm of bronchus and lung: Secondary | ICD-10-CM | POA: Diagnosis not present

## 2015-10-10 DIAGNOSIS — Z9049 Acquired absence of other specified parts of digestive tract: Secondary | ICD-10-CM | POA: Insufficient documentation

## 2015-10-10 DIAGNOSIS — E785 Hyperlipidemia, unspecified: Secondary | ICD-10-CM | POA: Diagnosis not present

## 2015-10-10 DIAGNOSIS — Z833 Family history of diabetes mellitus: Secondary | ICD-10-CM | POA: Diagnosis not present

## 2015-10-10 DIAGNOSIS — Z8249 Family history of ischemic heart disease and other diseases of the circulatory system: Secondary | ICD-10-CM | POA: Diagnosis not present

## 2015-10-10 DIAGNOSIS — I10 Essential (primary) hypertension: Secondary | ICD-10-CM | POA: Insufficient documentation

## 2015-10-10 DIAGNOSIS — Z90710 Acquired absence of both cervix and uterus: Secondary | ICD-10-CM | POA: Diagnosis not present

## 2015-10-10 DIAGNOSIS — D1730 Benign lipomatous neoplasm of skin and subcutaneous tissue of unspecified sites: Secondary | ICD-10-CM | POA: Diagnosis not present

## 2015-10-10 DIAGNOSIS — Z806 Family history of leukemia: Secondary | ICD-10-CM | POA: Diagnosis not present

## 2015-10-10 HISTORY — PX: MASS EXCISION: SHX2000

## 2015-10-10 SURGERY — EXCISION MASS
Anesthesia: General | Site: Arm Lower | Laterality: Left

## 2015-10-10 MED ORDER — FENTANYL CITRATE (PF) 100 MCG/2ML IJ SOLN
INTRAMUSCULAR | Status: AC
  Filled 2015-10-10: qty 2

## 2015-10-10 MED ORDER — FENTANYL CITRATE (PF) 100 MCG/2ML IJ SOLN: 50.0000 ug | INTRAMUSCULAR | Status: DC | PRN

## 2015-10-10 MED ORDER — SCOPOLAMINE 1 MG/3DAYS TD PT72: 1.0000 | MEDICATED_PATCH | Freq: Once | TRANSDERMAL | Status: DC | PRN

## 2015-10-10 MED ORDER — DEXAMETHASONE SODIUM PHOSPHATE 10 MG/ML IJ SOLN
INTRAMUSCULAR | Status: AC
  Filled 2015-10-10: qty 1

## 2015-10-10 MED ORDER — HYDROCODONE-ACETAMINOPHEN 5-325 MG PO TABS: 1.0000 | ORAL_TABLET | ORAL | 0 refills | Status: DC | PRN

## 2015-10-10 MED ORDER — CEFAZOLIN SODIUM-DEXTROSE 2-4 GM/100ML-% IV SOLN
INTRAVENOUS | Status: AC
  Filled 2015-10-10: qty 100

## 2015-10-10 MED ORDER — FENTANYL CITRATE (PF) 100 MCG/2ML IJ SOLN
INTRAMUSCULAR | Status: DC | PRN
  Administered 2015-10-10: 100 ug via INTRAVENOUS
  Administered 2015-10-10: 50 ug via INTRAVENOUS

## 2015-10-10 MED ORDER — DEXAMETHASONE SODIUM PHOSPHATE 4 MG/ML IJ SOLN
INTRAMUSCULAR | Status: DC | PRN
  Administered 2015-10-10: 10 mg via INTRAVENOUS

## 2015-10-10 MED ORDER — KETOROLAC TROMETHAMINE 30 MG/ML IJ SOLN
INTRAMUSCULAR | Status: DC | PRN
  Administered 2015-10-10: 30 mg via INTRAVENOUS

## 2015-10-10 MED ORDER — BUPIVACAINE HCL (PF) 0.5 % IJ SOLN
INTRAMUSCULAR | Status: AC
  Filled 2015-10-10: qty 30

## 2015-10-10 MED ORDER — PROPOFOL 10 MG/ML IV BOLUS
INTRAVENOUS | Status: AC
  Filled 2015-10-10: qty 20

## 2015-10-10 MED ORDER — LIDOCAINE 2% (20 MG/ML) 5 ML SYRINGE
INTRAMUSCULAR | Status: AC
  Filled 2015-10-10: qty 5

## 2015-10-10 MED ORDER — GLYCOPYRROLATE 0.2 MG/ML IJ SOLN: 0.2000 mg | Freq: Once | INTRAMUSCULAR | Status: DC | PRN

## 2015-10-10 MED ORDER — FENTANYL CITRATE (PF) 100 MCG/2ML IJ SOLN: 25.0000 ug | INTRAMUSCULAR | Status: DC | PRN

## 2015-10-10 MED ORDER — ONDANSETRON HCL 4 MG/2ML IJ SOLN
INTRAMUSCULAR | Status: AC
  Filled 2015-10-10: qty 2

## 2015-10-10 MED ORDER — MIDAZOLAM HCL 2 MG/2ML IJ SOLN
INTRAMUSCULAR | Status: AC
  Filled 2015-10-10: qty 2

## 2015-10-10 MED ORDER — KETOROLAC TROMETHAMINE 30 MG/ML IJ SOLN
INTRAMUSCULAR | Status: AC
  Filled 2015-10-10: qty 1

## 2015-10-10 MED ORDER — CEFAZOLIN SODIUM-DEXTROSE 2-4 GM/100ML-% IV SOLN
2.0000 g | INTRAVENOUS | Status: AC
  Administered 2015-10-10: 2 g via INTRAVENOUS

## 2015-10-10 MED ORDER — BUPIVACAINE HCL (PF) 0.5 % IJ SOLN
INTRAMUSCULAR | Status: DC | PRN
  Administered 2015-10-10: 6 mL

## 2015-10-10 MED ORDER — MIDAZOLAM HCL 5 MG/5ML IJ SOLN
INTRAMUSCULAR | Status: DC | PRN
  Administered 2015-10-10: 2 mg via INTRAVENOUS

## 2015-10-10 MED ORDER — LIDOCAINE HCL (CARDIAC) 20 MG/ML IV SOLN
INTRAVENOUS | Status: DC | PRN
  Administered 2015-10-10: 30 mg via INTRAVENOUS

## 2015-10-10 MED ORDER — CHLORHEXIDINE GLUCONATE 4 % EX LIQD: 60.0000 mL | Freq: Once | CUTANEOUS | Status: DC

## 2015-10-10 MED ORDER — MIDAZOLAM HCL 2 MG/2ML IJ SOLN: 1.0000 mg | INTRAMUSCULAR | Status: DC | PRN

## 2015-10-10 MED ORDER — PROPOFOL 10 MG/ML IV BOLUS
INTRAVENOUS | Status: DC | PRN
Start: 2015-10-10 — End: 2015-10-10
  Administered 2015-10-10: 200 mg via INTRAVENOUS

## 2015-10-10 MED ORDER — LACTATED RINGERS IV SOLN
INTRAVENOUS | Status: DC
  Administered 2015-10-10 (×2): via INTRAVENOUS

## 2015-10-10 MED ORDER — BUPIVACAINE-EPINEPHRINE (PF) 0.5% -1:200000 IJ SOLN
INTRAMUSCULAR | Status: AC
  Filled 2015-10-10: qty 30

## 2015-10-10 SURGICAL SUPPLY — 63 items
ADH SKN CLS APL DERMABOND .7 (GAUZE/BANDAGES/DRESSINGS)
APL SKNCLS STERI-STRIP NONHPOA (GAUZE/BANDAGES/DRESSINGS) ×1
BANDAGE ACE 3X5.8 VEL STRL LF (GAUZE/BANDAGES/DRESSINGS) IMPLANT
BANDAGE ACE 4X5 VEL STRL LF (GAUZE/BANDAGES/DRESSINGS) ×1 IMPLANT
BANDAGE ESMARK 6X9 LF (GAUZE/BANDAGES/DRESSINGS) IMPLANT
BENZOIN TINCTURE PRP APPL 2/3 (GAUZE/BANDAGES/DRESSINGS) ×1 IMPLANT
BLADE SURG 15 STRL LF DISP TIS (BLADE) ×1 IMPLANT
BLADE SURG 15 STRL SS (BLADE) ×4
BNDG CMPR 9X4 STRL LF SNTH (GAUZE/BANDAGES/DRESSINGS) ×1
BNDG CMPR 9X6 STRL LF SNTH (GAUZE/BANDAGES/DRESSINGS)
BNDG COHESIVE 4X5 TAN STRL (GAUZE/BANDAGES/DRESSINGS) IMPLANT
BNDG ESMARK 4X9 LF (GAUZE/BANDAGES/DRESSINGS) ×1 IMPLANT
BNDG ESMARK 6X9 LF (GAUZE/BANDAGES/DRESSINGS)
CLSR STERI-STRIP ANTIMIC 1/2X4 (GAUZE/BANDAGES/DRESSINGS) ×1 IMPLANT
CORDS BIPOLAR (ELECTRODE) ×1 IMPLANT
COVER BACK TABLE 60X90IN (DRAPES) ×2 IMPLANT
COVER MAYO STAND STRL (DRAPES) ×1 IMPLANT
DERMABOND ADVANCED (GAUZE/BANDAGES/DRESSINGS)
DERMABOND ADVANCED .7 DNX12 (GAUZE/BANDAGES/DRESSINGS) IMPLANT
DRAPE EXTREMITY T 121X128X90 (DRAPE) ×2 IMPLANT
DRAPE LAPAROTOMY 100X72 PEDS (DRAPES) IMPLANT
DRAPE U-SHAPE 47X51 STRL (DRAPES) ×1 IMPLANT
DRAPE U-SHAPE 76X120 STRL (DRAPES) IMPLANT
DRSG EMULSION OIL 3X3 NADH (GAUZE/BANDAGES/DRESSINGS) ×1 IMPLANT
DRSG PAD ABDOMINAL 8X10 ST (GAUZE/BANDAGES/DRESSINGS) ×1 IMPLANT
DURAPREP 26ML APPLICATOR (WOUND CARE) ×2 IMPLANT
ELECT REM PT RETURN 9FT ADLT (ELECTROSURGICAL)
ELECTRODE REM PT RTRN 9FT ADLT (ELECTROSURGICAL) IMPLANT
GAUZE SPONGE 4X4 12PLY STRL (GAUZE/BANDAGES/DRESSINGS) ×1 IMPLANT
GAUZE XEROFORM 1X8 LF (GAUZE/BANDAGES/DRESSINGS) ×2 IMPLANT
GLOVE BIOGEL PI IND STRL 7.0 (GLOVE) IMPLANT
GLOVE BIOGEL PI IND STRL 9 (GLOVE) IMPLANT
GLOVE BIOGEL PI INDICATOR 7.0 (GLOVE) ×1
GLOVE BIOGEL PI INDICATOR 9 (GLOVE) ×1
GLOVE ECLIPSE 6.5 STRL STRAW (GLOVE) ×1 IMPLANT
GOWN STRL REUS W/ TWL LRG LVL3 (GOWN DISPOSABLE) ×1 IMPLANT
GOWN STRL REUS W/TWL LRG LVL3 (GOWN DISPOSABLE) ×2
GOWN STRL REUS W/TWL XL LVL3 (GOWN DISPOSABLE) ×1 IMPLANT
NDL HYPO 25X1 1.5 SAFETY (NEEDLE) IMPLANT
NEEDLE HYPO 25X1 1.5 SAFETY (NEEDLE) ×2 IMPLANT
PACK BASIN DAY SURGERY FS (CUSTOM PROCEDURE TRAY) ×2 IMPLANT
PAD CAST 4YDX4 CTTN HI CHSV (CAST SUPPLIES) IMPLANT
PADDING CAST COTTON 4X4 STRL (CAST SUPPLIES) ×2
PENCIL BUTTON HOLSTER BLD 10FT (ELECTRODE) IMPLANT
SPLINT PLASTER CAST XFAST 3X15 (CAST SUPPLIES) IMPLANT
SPLINT PLASTER XTRA FASTSET 3X (CAST SUPPLIES) ×1
SPONGE GAUZE 4X4 12PLY STER LF (GAUZE/BANDAGES/DRESSINGS) ×1 IMPLANT
SPONGE GAUZE 4X4 16PLY UNSTER (WOUND CARE) ×2 IMPLANT
STAPLER VISISTAT (STAPLE) IMPLANT
STOCKINETTE 4X48 STRL (DRAPES) ×1 IMPLANT
STOCKINETTE 6  STRL (DRAPES)
STOCKINETTE 6 STRL (DRAPES) IMPLANT
STOCKINETTE IMPERVIOUS LG (DRAPES) IMPLANT
STRIP CLOSURE SKIN 1/2X4 (GAUZE/BANDAGES/DRESSINGS) ×1 IMPLANT
SUT VIC AB 0 SH 27 (SUTURE) IMPLANT
SUT VIC AB 2-0 SH 27 (SUTURE) ×2
SUT VIC AB 2-0 SH 27XBRD (SUTURE) IMPLANT
SUT VIC AB 3-0 X1 27 (SUTURE) ×1 IMPLANT
SUT VICRYL 4-0 PS2 18IN ABS (SUTURE) ×1 IMPLANT
SYR BULB 3OZ (MISCELLANEOUS) ×1 IMPLANT
SYR CONTROL 10ML LL (SYRINGE) ×1 IMPLANT
TOWEL OR 17X24 6PK STRL BLUE (TOWEL DISPOSABLE) ×2 IMPLANT
UNDERPAD 30X30 (UNDERPADS AND DIAPERS) ×1 IMPLANT

## 2015-10-10 NOTE — H&P (Signed)
[name] is an 55 y.o. female.   Chief Complaint:  Left forearm mass HPI:  Patient presents to the hospital with the above complaint.  Failed conservative treatment.    Past Medical History:  Diagnosis Date  . Anxiety   . Asthma    every now and then.  Wheezing and coughing  . Cancer ([place]) 1997   left lung  . Chronic abdominal pain   . Chronic back pain   . Chronic joint pain   . Depression   . GERD (gastroesophageal reflux disease)   . HLD (hyperlipidemia)   . HTN (hypertension)   . Nausea and vomiting    chronic, recurrent  . Osteoarthrosis involving more than one site but not generalized   . Palpitations 01/2013   chronic, intermittent  . Shortness of breath dyspnea    with exertion    Past Surgical History:  Procedure Laterality Date  . ABDOMINAL HYSTERECTOMY    . ABDOMINAL SURGERY     ovarian cyst removal  . BIOPSY N/A 11/22/2013   Procedure: GASTRIC BIOPSY;  Surgeon: Daneil Dolin, MD;  Location: [facility];  Service: Endoscopy;  Laterality: N/A;  . BLADDER SURGERY  suspension x4   x 4  . CARPAL TUNNEL RELEASE Right 07/11/2015   Procedure: RIGHT CARPAL TUNNEL RELEASE;  Surgeon: Jessy Oto, MD;  Location: [facility];  Service: Orthopedics;  Laterality: Right;  . CHOLECYSTECTOMY N/A 04/05/2014   Procedure: LAPAROSCOPIC CHOLECYSTECTOMY;  Surgeon: Aviva Signs Md, MD;  Location: [facility];  Service: General;  Laterality: N/A;  . COCCYGECTOMY N/A 04/11/2015   Procedure: COCCYGECTOMY WITH OSTEOTOMY THROUGH AREA OF DEFORMITY;  Surgeon: Jessy Oto, MD;  Location: [facility];  Service: Orthopedics;  Laterality: N/A;  . COLONOSCOPY    . COLONOSCOPY WITH PROPOFOL N/A 11/22/2013   Dr. Gala Romney: Grade 3 and 4/internal hemorrhoids?"likely source of hematochezia Normal colonoscopy otherwise.   . ESOPHAGOGASTRODUODENOSCOPY (EGD) WITH PROPOFOL N/A 11/22/2013   Dr. Gala Romney: Incomplete Schatzki's ring dilated and disrupted as described above.  Small hiatal hernia. Focally abnormal  gastric mucosa of uncertain significance status post biopsy, reactive gastropathy, negative H.pylori  . ESOPHAGOGASTRODUODENOSCOPY (EGD) WITH PROPOFOL N/A 08/04/2015   Dr. Gala Romney: mild Schatzki's ring s/p dilation, small hiatal hernia   . EXCISION VAGINAL CYST Left 06/20/2012   Procedure: EXCISION LEFT LABIAL CYST;  Surgeon: Jonnie Kind, MD;  Location: [facility];  Service: Gynecology;  Laterality: Left;  . LOBECTOMY Left   . LUNG CANCER SURGERY  1997   age 9, resection only  . MALONEY DILATION N/A 11/22/2013   Procedure: [name] DILATION;  Surgeon: Daneil Dolin, MD;  [facility];  Service: Endoscopy;  Laterality: N/A;  56  . MALONEY DILATION N/A 08/04/2015   Procedure: [name] DILATION;  Surgeon: Daneil Dolin, MD;  [facility];  Service: Endoscopy;  Laterality: N/A;  . MASS EXCISION N/A 07/23/2014   Procedure: EXCISIONAL BIOPSY NODULE LEFT PARACOCCYGEAL AREA;  Surgeon: Jessy Oto, MD;  Location: [facility];  Service: Orthopedics;  Laterality: N/A;  . TUBAL LIGATION      Family History  Problem Relation Age of Onset  . Diabetes Other   . Heart disease Other     has Psychologist, forensic  . Hypertension Mother   . Kidney disease Mother   . Hypertension Father   . Kidney disease Father   . Heart disease Father   . Cancer Father     leukemia  . Hypertension Sister   .  Hypertension Sister   . Colon cancer Neg Hx    Social History:  reports that she quit smoking about 20 years ago. Her smoking use included Cigarettes. She has a 37.50 pack-year smoking history. She has never used smokeless tobacco. She reports that she does not drink alcohol or use drugs.  Allergies:  Allergies  Allergen Reactions  . Promethazine Hcl Nausea And Vomiting and Other (See Comments)    "Makes my head hurt really bad too"    Medications Prior to Admission  Medication Sig Dispense Refill  . acyclovir (ZOVIRAX) 400 MG tablet TAKE ONE TABLET BY MOUTH TWICE DAILY 60 tablet 5  . albuterol (PROAIR HFA)  108 (90 BASE) MCG/ACT inhaler Inhale 2 puffs into the lungs every 4 (four) hours as needed. for shortness of breath 1 each 2  . AMITIZA 24 MCG capsule TAKE ONE CAPSULE BY MOUTH TWICE DAILY WITH MEALS 60 capsule 5  . amitriptyline (ELAVIL) 150 MG tablet TAKE ONE TABLET BY MOUTH ONCE DAILY WITH BREAKFAST 30 tablet 5  . atorvastatin (LIPITOR) 40 MG tablet TAKE ONE TABLET BY MOUTH ONCE DAILY 30 tablet 5  . Calcium Citrate-Vitamin D 250-200 MG-UNIT TABS Take 1 tablet by mouth daily.     Marland Kitchen DEXILANT 60 MG capsule TAKE ONE CAPSULE BY MOUTH ONCE DAILY 90 capsule 3  . docusate sodium (COLACE) 100 MG capsule Take 2 capsules (200 mg total) by mouth 2 (two) times daily. 100 capsule 0  . DULoxetine (CYMBALTA) 60 MG capsule Take 1 capsule (60 mg total) by mouth 2 (two) times daily. 120 capsule 11  . gabapentin (NEURONTIN) 300 MG capsule TAKE ONE CAPSULE BY MOUTH THREE TIMES DAILY 90 capsule 2  . hydrochlorothiazide (HYDRODIURIL) 25 MG tablet Take 1 tablet (25 mg total) by mouth daily. 90 tablet 2  . HYDROcodone-acetaminophen (NORCO) 5-325 MG tablet Take 1-2 tablets by mouth every 6 (six) hours as needed for moderate pain. MAXIMUM TOTAL ACETAMINOPHEN DOSE IS 4000 MG PER DAY 60 tablet 0    No results found for this or any previous visit (from the past 81 hour(s)). No results found.  Review of Systems  Constitutional: Negative.   HENT: Negative.   Eyes: Negative.   Respiratory: Negative.   Gastrointestinal: Negative.   Genitourinary: Negative.   Musculoskeletal:       Left forearm pain.   Skin: Negative.   Neurological: Negative.   Psychiatric/Behavioral: Negative.     Height '5\' 4"'$  (1.626 m), weight 77.1 kg (170 lb). Physical Exam  Constitutional: She is oriented to person, place, and time. No distress.  HENT:  Head: Normocephalic and atraumatic.  Eyes: EOM are normal. Pupils are equal, round, and reactive to light.  Neck: Normal range of motion.  Cardiovascular: Normal rate.   Respiratory:  Effort normal and breath sounds normal.  GI: She exhibits no distension.  Musculoskeletal: She exhibits tenderness (left forearm mass).  Neurological: She is alert and oriented to person, place, and time.  Skin: Skin is warm and dry.  Psychiatric: She has a normal mood and affect.     Assessment/Plan Left dorsoradial forearm mass  Will proceed with EXCISIONAL BIOPSY OF LEFT DORSORADIAL FOREARM MASS LIPOMA (Left Arm Lower).  Surgical procedure along with possible risks and complications discussed.  All questions answered and wants to proceed.   Lanae Crumbly, PA-C 10/10/2015, 10:30 AM  Patient examined and lab reviewed with Ricard Dillon, PA-C.

## 2015-10-10 NOTE — Discharge Instructions (Addendum)
° ° °  Keep dressing dry. Elevated left forearm above heart. Apply ice to forearm 1 hour on and one half hour off for 48 hours. May Apply ice at night and go to sleep with out changing. Be sure to keep ice off fingers to prevent frost bite.  Return to office in two weeks for removal of dressing and examination of the incision, no sutures need removing, they are deep to the skin and will dissolve. Move fingers normally.    Post Anesthesia Home Care Instructions  Activity: Get plenty of rest for the remainder of the day. A responsible adult should stay with you for 24 hours following the procedure.  For the next 24 hours, DO NOT: -Drive a car -Paediatric nurse -Drink alcoholic beverages -Take any medication unless instructed by your physician -Make any legal decisions or sign important papers.  Meals: Start with liquid foods such as gelatin or soup. Progress to regular foods as tolerated. Avoid greasy, spicy, heavy foods. If nausea and/or vomiting occur, drink only clear liquids until the nausea and/or vomiting subsides. Call your physician if vomiting continues.  Special Instructions/Symptoms: Your throat may feel dry or sore from the anesthesia or the breathing tube placed in your throat during surgery. If this causes discomfort, gargle with warm salt water. The discomfort should disappear within 24 hours.  If you had a scopolamine patch placed behind your ear for the management of post- operative nausea and/or vomiting:  1. The medication in the patch is effective for 72 hours, after which it should be removed.  Wrap patch in a tissue and discard in the trash. Wash hands thoroughly with soap and water. 2. You may remove the patch earlier than 72 hours if you experience unpleasant side effects which may include dry mouth, dizziness or visual disturbances. 3. Avoid touching the patch. Wash your hands with soap and water after contact with the patch.

## 2015-10-10 NOTE — Brief Op Note (Signed)
PATIENT ID:      [name]  MRN:     [id number] DOB/AGE:    1960-09-12 / 55 y.o.       OPERATIVE REPORT   DATE OF PROCEDURE:  10/10/2015      PREOPERATIVE DIAGNOSIS:   left dorsoradial forearm mass, lipoma                                                       Body mass index is 29.52 kg/m.    POSTOPERATIVE DIAGNOSIS:   left dorsoradial forearm mass, lipoma                                                                     Body mass index is 29.52 kg/m.    PROCEDURE:  Procedure(s): EXCISIONAL BIOPSY OF LEFT DORSORADIAL FOREARM MASS LIPOMA    SURGEON: NITKA,JAMES E   ASSISTANT: Esaw Grandchild          ANESTHESIA:  General, GA combined with regional for post-op pain and supplemented with local infiltration with marcaine 0.5% total 10cc.Dr. Oletta Lamas.  EBL:25cc  DRAINS:none  TOURNIQUET TIME: 17 min @ 010 mm Hg  COMPLICATIONS:  None   CONDITION:  Stable  FINDINGS: Mass left dorsoradial mid forearm  2.5cm x 6cm x 4cm, appearance of lipoma. Single suture to a vein over the proximal superficial area of the specimen.  SPECIMEN: sent to pathology for diagnosis, 2.5 x 4.0 x 6.0 mass lipoma tissue.    NITKA,JAMES E 10/10/2015, 3:49 PM

## 2015-10-10 NOTE — Anesthesia Preprocedure Evaluation (Addendum)
Anesthesia Evaluation  Patient identified by MRN, date of birth, ID band Patient awake    Reviewed: Allergy & Precautions, NPO status , Patient's Chart, lab work & pertinent test results  Airway Mallampati: II  TM Distance: >3 FB     Dental   Pulmonary shortness of breath, asthma , sleep apnea , former smoker,    breath sounds clear to auscultation       Cardiovascular hypertension,  Rhythm:Regular Rate:Normal     Neuro/Psych    GI/Hepatic Neg liver ROS, GERD  ,  Endo/Other  negative endocrine ROS  Renal/GU negative Renal ROS     Musculoskeletal  (+) Arthritis ,   Abdominal   Peds  Hematology   Anesthesia Other Findings   Reproductive/Obstetrics                            Anesthesia Physical Anesthesia Plan  ASA: III  Anesthesia Plan: General   Post-op Pain Management:    Induction: Intravenous  Airway Management Planned: LMA  Additional Equipment:   Intra-op Plan:   Post-operative Plan: Extubation in OR  Informed Consent: I have reviewed the patients History and Physical, chart, labs and discussed the procedure including the risks, benefits and alternatives for the proposed anesthesia with the patient or authorized representative who has indicated his/her understanding and acceptance.     Plan Discussed with: CRNA and Anesthesiologist  Anesthesia Plan Comments:         Anesthesia Quick Evaluation

## 2015-10-10 NOTE — Anesthesia Procedure Notes (Signed)
Procedure Name: LMA Insertion Date/Time: 10/10/2015 2:51 PM Performed by: Toula Moos L Pre-anesthesia Checklist: Patient identified, Emergency Drugs available, Suction available, Patient being monitored and Timeout performed Patient Re-evaluated:Patient Re-evaluated prior to inductionOxygen Delivery Method: Circle system utilized Preoxygenation: Pre-oxygenation with 100% oxygen Intubation Type: IV induction Ventilation: Mask ventilation without difficulty LMA: LMA inserted LMA Size: 4.0 Number of attempts: 1 Airway Equipment and Method: Bite block Placement Confirmation: positive ETCO2 Tube secured with: Tape Dental Injury: Teeth and Oropharynx as per pre-operative assessment

## 2015-10-10 NOTE — Transfer of Care (Signed)
Immediate Anesthesia Transfer of Care Note  Patient: [name]  Procedure(s) Performed: Procedure(s): EXCISIONAL BIOPSY OF LEFT DORSORADIAL FOREARM MASS LIPOMA (Left)  Patient Location: PACU  Anesthesia Type:General  Level of Consciousness: awake and patient cooperative  Airway & Oxygen Therapy: Patient Spontanous Breathing and Patient connected to face mask oxygen  Post-op Assessment: Report given to RN and Post -op Vital signs reviewed and stable  Post vital signs: Reviewed and stable  Last Vitals:  Vitals:   10/10/15 1255 10/10/15 1554  BP: (!) 123/95 (!) 162/73  Pulse: 71   Resp: 18 18  Temp: 36.9 C     Last Pain:  Vitals:   10/10/15 1255  TempSrc: Oral         Complications: No apparent anesthesia complications

## 2015-10-10 NOTE — Interval H&P Note (Signed)
The patient has been re-examined, and the chart reviewed, and there have been no interval changes to the documented history and physical.    The risks, benefits, and alternatives have been discussed at length, and the patient is willing to proceed.   

## 2015-10-10 NOTE — Op Note (Signed)
10/10/2015  3:56 PM  PATIENT:  [name]  55 y.o. female  MRN: [id number]  PRE-OPERATIVE DIAGNOSIS:  left dorsoradial forearm mass, lipoma  POST-OPERATIVE DIAGNOSIS:  left dorsoradial forearm mass, lipoma  PROCEDURE:  Procedure(s): EXCISIONAL BIOPSY OF LEFT DORSORADIAL FOREARM MASS LIPOMA   OPERATIVE REPORT    SURGEON:  Jessy Oto, MD     ASSISTANT:  Benjiman Core, PA-C  (Present throughout the entire procedure and necessary for completion of procedure in a timely manner)     ANESTHESIA:  Gen. ELMA, Supplemented with local Marcaine 0.5 % 10 cc. Dr. Oletta Lamas    COMPLICATIONS:  None.     EBL: 20 mL  TOURNIQUET TIME: 17 minutes at  250 mmHg   PROCEDURE: The patient was met in the holding area, and the appropriate left dorsal radial forearm identified and marked with the next and my initials. The patient was then transported to OR and was placed on the operative table in a supine position. The patient was then placed under general anesthesia without difficulty.The left upper extremity was then prepped using sterile conditions and draped using sterile technique from the left upper arm to the left fingers. Tourniquet about the left upper arm. Draped in the usual manner.  Time-out procedure was called and correct. The left arm was elevated and sanguine dated with an Esmarch bandage and tourniquet inflated at the left upper arm to 250 mmHg Using loope magnification a 3.5 inch incision was made longitudinally over the dorsal radial aspect of the left mid forearm with 15 blade scalpel.  Incision through skin and subcutaneous tissue to the dorsal forearm and then further dissection carried subcutaneously both medial and lateral with Stevens scissors inline with the dorsal radial forearm. The skin and subcutaneous tissue retracted and the the soft tissue with this mass noted and carefully freed up with Gerilyn Nestle scissors and then Metzenbaum scissors circumferentially and the proximal margin  identified. A large superficial vein found to be coursing over the superficial aspect of this mass that measured about 2-1/2 cm in thickness with a width of 4 cm and a length of about 7 cm. Retracting the distal skin and subcutaneous tissues 2-0 Vicryl suture was used to suture ligate the both proximally and at the margin of the superficial proximal aspect of the forearm mass also distally. The vein was then divided and then the mass freed up from the deeper layers overlying the superficial fascia of the dorsal radial aspect of the extensor compartment overlying the radialis. No neural structures noted in the area of the pain nor does the small tissue areas that were attached to this mass. Mass is then carefully freed up and removed from the incision site and for pathology in formalin. The skin and subcutaneous layers were then infiltrated with Marcaine half percent plain total 10 mL. Tourniquet was then released. Bleeding controlled with bipolar electrocautery. The incision was then irrigated with copious amounts of irrigant solution, No active bleeding was present. The incision closed with a deep layer  of 2-0 Vicryl sutures and then the subcutaneous layers approximated with interrupted 3-0 Vicryl sutures. Tincture benzoin and Steri-Strips applied. Dry dressing of adaptic, 4x4s held in place with sterile webril.  A well padded volar splint applied extending from the left mid metacarpal level to the left proximal volar forearm with ace wrap.  The patient reactivated and returned to the PACU in good condition.  All instruments and sponge counts were correct. Total tourniquet time was 17 minutes. Specimen was  sent to pathology for examination and diagnosis.  SPECIMEN: Lipomatous mass measuring 2.5 cm x 4 cm x 6 cm clinical appearance of a lipoma. Specimen sent to pathology in formalin for diagnosis and evaluation.          Almin Livingstone E  10/10/2015, 3:56 PM

## 2015-10-11 NOTE — Anesthesia Postprocedure Evaluation (Signed)
Anesthesia Post Note  Patient: [name]  Procedure(s) Performed: Procedure(s) (LRB): EXCISIONAL BIOPSY OF LEFT DORSORADIAL FOREARM MASS LIPOMA (Left)  Patient location during evaluation: PACU Anesthesia Type: General Level of consciousness: awake Pain management: pain level controlled Vital Signs Assessment: post-procedure vital signs reviewed and stable Respiratory status: spontaneous breathing Cardiovascular status: stable Anesthetic complications: no    Last Vitals:  Vitals:   10/10/15 1630 10/10/15 1700  BP: (!) 143/66 (!) 111/97  Pulse: 80 87  Resp: 16 16  Temp:  37.1 C    Last Pain:  Vitals:   10/10/15 1630  TempSrc:   PainSc: 0-No pain                 EDWARDS,Raden Byington

## 2015-10-13 ENCOUNTER — Encounter (HOSPITAL_BASED_OUTPATIENT_CLINIC_OR_DEPARTMENT_OTHER): Payer: Self-pay | Admitting: Specialist

## 2015-10-21 ENCOUNTER — Other Ambulatory Visit: Payer: Self-pay | Admitting: Family Medicine

## 2015-10-21 DIAGNOSIS — M48062 Spinal stenosis, lumbar region with neurogenic claudication: Secondary | ICD-10-CM

## 2015-10-21 DIAGNOSIS — A609 Anogenital herpesviral infection, unspecified: Secondary | ICD-10-CM

## 2015-10-22 ENCOUNTER — Telehealth: Payer: Self-pay | Admitting: Internal Medicine

## 2015-10-22 NOTE — Telephone Encounter (Signed)
Pt needs a referral for a mammogram at [facility]. Pt left breast is swollen, feel heavy. Pt wants to know if it is ok to get a mammogram even though pt is having issues with left breast. Please advise. Thanks! ep

## 2015-10-24 ENCOUNTER — Other Ambulatory Visit: Payer: Self-pay | Admitting: Family Medicine

## 2015-10-24 DIAGNOSIS — A609 Anogenital herpesviral infection, unspecified: Secondary | ICD-10-CM

## 2015-10-24 DIAGNOSIS — M48062 Spinal stenosis, lumbar region with neurogenic claudication: Secondary | ICD-10-CM

## 2015-10-24 MED ORDER — ACYCLOVIR 400 MG PO TABS: 400.0000 mg | ORAL_TABLET | Freq: Two times a day (BID) | ORAL | 5 refills | Status: DC

## 2015-10-24 NOTE — Telephone Encounter (Signed)
Needs refill on gabapentin and acyclovir.  [facility]  [employer]

## 2015-10-27 ENCOUNTER — Ambulatory Visit (INDEPENDENT_AMBULATORY_CARE_PROVIDER_SITE_OTHER): Payer: Medicare Other | Admitting: Specialist

## 2015-10-30 NOTE — Telephone Encounter (Signed)
Patient will need visit will PCP if she is having problems with breast since this will require a diagnostic mammogram. Appointment has been scheduled with PCP for 10/19.

## 2015-11-02 NOTE — Addendum Note (Signed)
Addendum  created 11/02/15 2213 by Finis Bud, MD   Anesthesia Staff edited

## 2015-11-05 ENCOUNTER — Encounter: Payer: Self-pay | Admitting: Gastroenterology

## 2015-11-05 ENCOUNTER — Telehealth: Payer: Self-pay | Admitting: General Practice

## 2015-11-05 ENCOUNTER — Ambulatory Visit (INDEPENDENT_AMBULATORY_CARE_PROVIDER_SITE_OTHER): Payer: Medicare Other | Admitting: Gastroenterology

## 2015-11-05 VITALS — BP 111/62 | HR 84 | Temp 97.6°F | Ht 64.0 in | Wt 173.8 lb

## 2015-11-05 DIAGNOSIS — R1312 Dysphagia, oropharyngeal phase: Secondary | ICD-10-CM | POA: Diagnosis not present

## 2015-11-05 NOTE — Assessment & Plan Note (Signed)
55 year old female with EGD in July revealing mild Schatzki's ring s/p dilation but now with reports of more oropharyngeal dysphagia, persistent phlegm, liquid regurgitation. She has seen Speech and undergone an MBSS; she has also seen ENT. I do not have ENT reports. Sensation of nasal regurgitation. Question of prominent cricopharyngeal muscle noted in speech pathology report. I question if this may be contributing to some of her symptoms but will need to review further with speech. She had a BPE last year without mention of this. Will obtain ENT notes from recent evaluation with further recommendations to follow. Continue Dexilant once each day and may use Zantac prn each evening.

## 2015-11-05 NOTE — Telephone Encounter (Signed)
Noted  

## 2015-11-05 NOTE — Telephone Encounter (Signed)
I received a call from the patient stating she was seen this morning by Vicente Males and she was told during that visit she would refer her to [facility].  However, the patient states she doesn't want to go to [facility], because she nor her family is familiar with [facility].  Routing to [employer]

## 2015-11-05 NOTE — Telephone Encounter (Signed)
Noted. Please see office visit addendum.

## 2015-11-05 NOTE — Progress Notes (Signed)
ENT notes reviewed. She underwent a laryngoscopy and noted moderate laryngeal edema. She also had endoscopic cauterization of left anterior and superior nasal septum due to chronic nose bleeds.   I discussed patient's clinical scenario with Genene Churn, Speech Pathologist. She does have a prominent cricopharyngeal muscle and POSSIBLY the start of a very small Zenker's diverticulum on frame 3 of MBSS. However, does not appear to warrant a repeat BPE, as this may not help shed any light. Could consider neurology evaluation as there is not a brisk rise in uvula retraction in preparation for pharyngeal phase BUT this could be normal for her. (THIS could possibly explain nasal regurgitation feeling). Frame 10 of MBSS shows patient likely "mentally" trying to prepare and effortful swallowing of pill which is more of a behavioral thing.   IF the patient is willing to go to [facility] for evaluation, I would love to refer her. If not, I understand, and we could have her continue Dexilant once daily, Zantac prn, and GERD dietary/behavior modification. I agree with Alexis Goodell that a BPE may not show much more than what we already know; however, her last one was Feb 2016 so we are closing in on almost 2 years ago. We could order one more just to get an updated view on her anatomy and go from there.

## 2015-11-05 NOTE — Patient Instructions (Signed)
I am going to discuss with Speech Therapy your results.   I am likely going to refer you to [facility] for further evaluation.

## 2015-11-05 NOTE — Progress Notes (Signed)
Referring Provider: Sydnee Levans* Primary Care Physician:  Adin Hector, MD  Primary GI: Dr. Gala Romney   Chief Complaint  Patient presents with  . Follow-up    HPI:   [name] is a 55 y.o. female presenting today with a history of chronic constipation, GERD, dysphagia. Multiple dilations in past, most recently in July 2017 of mild Schatzki's ring. History of chronically elevated alk phos with serologic markers negative for PBC. Hep BsAg and Hep C antibody negative. GGT normal. Normal transaminases. Last US abdomen Feb 2016 with mild fatty liver. Following LFTs yearly. Has had issues at last visit with reports of oropharyngeal dysphagia, reporting liquids and food going up her nose. She saw Speech who noted had within functional limits of managing oral intake; the barium tablet stuck transiently in the pyriform but managed with a dry swallow. Prominent cricopharyngeus noted. She was also referred to ENT and saw Dr. Benjamine Mola and had some type of procedure. I do not have reports at time of visit.   Always gagging up phlegm. States her left breast is much larger than her right and wonders if this is causing any of her issues. Just had surgery on her left arm. Had a "tumor on her arm". Has issues mainly with liquids. Gets to neck and has to regurgitate at times. Phlegm gets so bad she has to throw up.   Past Medical History:  Diagnosis Date  . Anxiety   . Asthma    every now and then.  Wheezing and coughing  . Cancer ([place]) 1997   left lung  . Chronic abdominal pain   . Chronic back pain   . Chronic joint pain   . Depression   . GERD (gastroesophageal reflux disease)   . HLD (hyperlipidemia)   . HTN (hypertension)   . Nausea and vomiting    chronic, recurrent  . Osteoarthrosis involving more than one site but not generalized   . Palpitations 01/2013   chronic, intermittent  . Shortness of breath dyspnea    with exertion    Past Surgical History:  Procedure  Laterality Date  . ABDOMINAL HYSTERECTOMY    . ABDOMINAL SURGERY     ovarian cyst removal  . BIOPSY N/A 11/22/2013   Procedure: GASTRIC BIOPSY;  Surgeon: Daneil Dolin, MD;  Location: [facility];  Service: Endoscopy;  Laterality: N/A;  . BLADDER SURGERY  suspension x4   x 4  . CARPAL TUNNEL RELEASE Right 07/11/2015   Procedure: RIGHT CARPAL TUNNEL RELEASE;  Surgeon: Jessy Oto, MD;  Location: [place];  Service: Orthopedics;  Laterality: Right;  . CHOLECYSTECTOMY N/A 04/05/2014   Procedure: LAPAROSCOPIC CHOLECYSTECTOMY;  Surgeon: Aviva Signs Md, MD;  Location: [facility];  Service: General;  Laterality: N/A;  . COCCYGECTOMY N/A 04/11/2015   Procedure: COCCYGECTOMY WITH OSTEOTOMY THROUGH AREA OF DEFORMITY;  Surgeon: Jessy Oto, MD;  Location: [place];  Service: Orthopedics;  Laterality: N/A;  . COLONOSCOPY    . COLONOSCOPY WITH PROPOFOL N/A 11/22/2013   Dr. Gala Romney: Grade 3 and 4/internal hemorrhoids?"likely source of hematochezia Normal colonoscopy otherwise.   . ESOPHAGOGASTRODUODENOSCOPY (EGD) WITH PROPOFOL N/A 11/22/2013   Dr. Gala Romney: Incomplete Schatzki's ring dilated and disrupted as described above.  Small hiatal hernia. Focally abnormal gastric mucosa of uncertain significance status post biopsy, reactive gastropathy, negative H.pylori  . ESOPHAGOGASTRODUODENOSCOPY (EGD) WITH PROPOFOL N/A 08/04/2015   Dr. Gala Romney: mild Schatzki's ring s/p dilation, small hiatal hernia   . EXCISION VAGINAL  CYST Left 06/20/2012   Procedure: EXCISION LEFT LABIAL CYST;  Surgeon: Jonnie Kind, MD;  Location: [facility];  Service: Gynecology;  Laterality: Left;  . LOBECTOMY Left   . LUNG CANCER SURGERY  1997   age 49, resection only  . MALONEY DILATION N/A 11/22/2013   Procedure: [name] DILATION;  Surgeon: Daneil Dolin, MD;  Location: [facility];  Service: Endoscopy;  Laterality: N/A;  56  . MALONEY DILATION N/A 08/04/2015   Procedure: [name] DILATION;  Surgeon: Daneil Dolin, MD;  [facility];  Service: Endoscopy;  Laterality: N/A;  . MASS EXCISION N/A 07/23/2014   Procedure: EXCISIONAL BIOPSY NODULE LEFT PARACOCCYGEAL AREA;  Surgeon: Jessy Oto, MD;  Location: [place];  Service: Orthopedics;  Laterality: N/A;  . MASS EXCISION Left 10/10/2015   Procedure: EXCISIONAL BIOPSY OF LEFT DORSORADIAL FOREARM MASS LIPOMA;  Surgeon: Jessy Oto, MD;  Location: [place];  Service: Orthopedics;  Laterality: Left;  . TUBAL LIGATION      Current Outpatient Prescriptions  Medication Sig Dispense Refill  . acyclovir (ZOVIRAX) 400 MG tablet Take 1 tablet (400 mg total) by mouth 2 (two) times daily. 60 tablet 5  . albuterol (PROAIR HFA) 108 (90 BASE) MCG/ACT inhaler Inhale 2 puffs into the lungs every 4 (four) hours as needed. for shortness of breath 1 each 2  . AMITIZA 24 MCG capsule TAKE ONE CAPSULE BY MOUTH TWICE DAILY WITH MEALS 60 capsule 5  . amitriptyline (ELAVIL) 150 MG tablet TAKE ONE TABLET BY MOUTH ONCE DAILY WITH BREAKFAST 30 tablet 5  . atorvastatin (LIPITOR) 40 MG tablet TAKE ONE TABLET BY MOUTH ONCE DAILY 30 tablet 5  . Calcium Citrate-Vitamin D 250-200 MG-UNIT TABS Take 1 tablet by mouth daily.     Marland Kitchen DEXILANT 60 MG capsule TAKE ONE CAPSULE BY MOUTH ONCE DAILY 90 capsule 3  . docusate sodium (COLACE) 100 MG capsule Take 2 capsules (200 mg total) by mouth 2 (two) times daily. 100 capsule 0  . DULoxetine (CYMBALTA) 60 MG capsule Take 1 capsule (60 mg total) by mouth 2 (two) times daily. 120 capsule 11  . gabapentin (NEURONTIN) 300 MG capsule TAKE ONE CAPSULE BY MOUTH THREE TIMES DAILY 90 capsule 2  . hydrochlorothiazide (HYDRODIURIL) 25 MG tablet Take 1 tablet (25 mg total) by mouth daily. 90 tablet 2   No current facility-administered medications for this visit.     Allergies as of 11/05/2015 - Review Complete 11/05/2015  Allergen Reaction Noted  . Promethazine hcl Nausea And Vomiting and Other (See Comments) 07/26/2008    Family History  Problem Relation  Age of Onset  . Diabetes Other   . Heart disease Other     has Psychologist, forensic  . Hypertension Mother   . Kidney disease Mother   . Hypertension Father   . Kidney disease Father   . Heart disease Father   . Cancer Father     leukemia  . Hypertension Sister   . Hypertension Sister   . Colon cancer Neg Hx     Social History   Social History  . Marital status: Married    Spouse name: N/A  . Number of children: 3  . Years of education: N/A   Social History Main Topics  . Smoking status: Former Smoker    Packs/day: 1.50    Years: 25.00    Types: Cigarettes    Quit date: 04/07/1995  . Smokeless tobacco: Never Used  . Alcohol use  No  . Drug use: No  . Sexual activity: Not Currently    Birth control/ protection: Surgical   Other Topics Concern  . None   Social History Narrative  . None    Review of Systems: As mentioned in HPI.   Physical Exam: BP 111/62   Pulse 84   Temp 97.6 F (36.4 C) (Oral)   Ht '5\' 4"'  (1.626 m)   Wt 173 lb 12.8 oz (78.8 kg)   BMI 29.83 kg/m  General:   Alert and oriented. No distress noted. Pleasant and cooperative.  Head:  Normocephalic and atraumatic. Eyes:  Conjuctiva clear without scleral icterus. Mouth:  Oral mucosa pink and moist. Abdomen:  +BS, soft, non-tender and non-distended. No rebound or guarding. No HSM or masses noted. Msk:  Symmetrical without gross deformities. Normal posture. Extremities:  Without edema. Neurologic:  Alert and  oriented x4;  grossly normal neurologically. Psych:  Alert and cooperative. Normal mood and affect.

## 2015-11-05 NOTE — Telephone Encounter (Signed)
Patient can be reached at [phone]

## 2015-11-05 NOTE — Progress Notes (Signed)
CC'ED TO PCP 

## 2015-11-06 ENCOUNTER — Ambulatory Visit (INDEPENDENT_AMBULATORY_CARE_PROVIDER_SITE_OTHER): Payer: Medicare Other | Admitting: Internal Medicine

## 2015-11-06 ENCOUNTER — Ambulatory Visit (INDEPENDENT_AMBULATORY_CARE_PROVIDER_SITE_OTHER): Payer: Medicare Other | Admitting: Specialist

## 2015-11-06 ENCOUNTER — Encounter: Payer: Self-pay | Admitting: Internal Medicine

## 2015-11-06 VITALS — BP 126/71 | HR 87 | Temp 98.1°F | Ht 64.0 in

## 2015-11-06 DIAGNOSIS — N644 Mastodynia: Secondary | ICD-10-CM | POA: Insufficient documentation

## 2015-11-06 DIAGNOSIS — R2232 Localized swelling, mass and lump, left upper limb: Secondary | ICD-10-CM

## 2015-11-06 DIAGNOSIS — R7303 Prediabetes: Secondary | ICD-10-CM | POA: Diagnosis present

## 2015-11-06 LAB — POCT GLYCOSYLATED HEMOGLOBIN (HGB A1C): Hemoglobin A1C: 5.9

## 2015-11-06 NOTE — Assessment & Plan Note (Signed)
Diffuse tenderness to palpation of L breast with firm area. No distinct mass, however area of firmness concerning, as it differs from surrounding tissue and whole breast is tender and increasing in size.  - Referral to [facility] for diagnostic mammography for focal tenderness

## 2015-11-06 NOTE — Patient Instructions (Addendum)
It was nice meeting you today Ms. Witherspoon!  Please call as soon as you can to schedule a mammogram. You can call the [facility] at [phone]. Please tell them you need a diagnostic digital mammogram due to focal tenderness. Bring the referral sheet given to you today with you to your mammogram appointment.   If you have any questions or concerns, please feel free to call the clinic.   Be well,  Dr. Avon Gully

## 2015-11-06 NOTE — Assessment & Plan Note (Signed)
A1C 5.9 today (last 5.8 in 06/2015). Can consider checking again at next appointment. Will not begin medication today.

## 2015-11-06 NOTE — Progress Notes (Signed)
   Subjective:    Patient ID: [name], female    DOB: [birth year], 55 y.o.   MRN: [id number]  HPI  Patient presents for L breast pain.   L breast pain Began about a month ago. Reports that it feels as if her L breast is growing. She has had to buy a larger bra because of this. Says breast feels very heavy and is tender throughout. Denies any masses or nodules. No skin changes. No nipple discharge or bleeding. No abnormalities of the R breast. Denies any history of breast masses or abnormalities. Last mammogram 2012. Tried to get a mammogram a few weeks ago but was told at [facility] that she needed a referral for this.   Pre-diabetes Says she was diagnosed with prediabetes before, and is concerned that it has progressed to diabetes. Does not check her blood sugar at home, but would like her A1C checked today. Does not take any meds for diabetes.   Declined flu and Tdap today.   Smoking status reviewed. Former smoker.   Review of Systems See HPI.     Objective:   Physical Exam  Constitutional: She is oriented to person, place, and time. She appears well-developed and well-nourished. No distress.  HENT:  Head: Normocephalic and atraumatic.  Pulmonary/Chest: Effort normal. No respiratory distress.    Neurological: She is alert and oriented to person, place, and time.  Skin: Skin is warm and dry.  Psychiatric: She has a normal mood and affect. Her behavior is normal.  Vitals reviewed.     Assessment & Plan:  Prediabetes A1C 5.9 today (last 5.8 in 06/2015). Can consider checking again at next appointment. Will not begin medication today.    Breast pain, left Diffuse tenderness to palpation of L breast with firm area. No distinct mass, however area of firmness concerning, as it differs from surrounding tissue and whole breast is tender and increasing in size.  - Referral to [facility] for diagnostic mammography for focal tenderness  Adin Hector, MD, MPH PGY-2 [facility] Pager [phone]

## 2015-11-07 NOTE — Addendum Note (Signed)
Addended by: Levert Feinstein F on: 11/07/2015 08:42 AM   Modules accepted: Orders

## 2015-11-10 ENCOUNTER — Ambulatory Visit
Admission: RE | Admit: 2015-11-10 | Discharge: 2015-11-10 | Disposition: A | Discharge status: Home / Self Care | Payer: Medicare Other | Source: Ambulatory Visit | Attending: Family Medicine | Admitting: Family Medicine

## 2015-11-10 DIAGNOSIS — N644 Mastodynia: Secondary | ICD-10-CM

## 2015-11-13 NOTE — Progress Notes (Signed)
Pt is aware. She said she would like to have the BPE done. Please schedule.

## 2015-11-14 ENCOUNTER — Other Ambulatory Visit: Payer: Self-pay

## 2015-11-14 DIAGNOSIS — R1312 Dysphagia, oropharyngeal phase: Secondary | ICD-10-CM

## 2015-11-14 NOTE — Progress Notes (Signed)
DG Esophagus scheduled for 11/18/15 at 9:30 am at [facility] 15 min early. NPO 3 hours prior to test. Called and informed pt.

## 2015-11-18 ENCOUNTER — Ambulatory Visit (HOSPITAL_COMMUNITY): Payer: Medicare Other

## 2015-11-21 ENCOUNTER — Other Ambulatory Visit: Payer: Self-pay | Admitting: Family Medicine

## 2015-11-21 DIAGNOSIS — M48062 Spinal stenosis, lumbar region with neurogenic claudication: Secondary | ICD-10-CM

## 2015-11-25 ENCOUNTER — Ambulatory Visit (HOSPITAL_COMMUNITY): Payer: Medicare Other

## 2015-11-25 ENCOUNTER — Other Ambulatory Visit: Payer: Self-pay | Admitting: Internal Medicine

## 2015-11-25 DIAGNOSIS — M48062 Spinal stenosis, lumbar region with neurogenic claudication: Secondary | ICD-10-CM

## 2015-11-25 NOTE — Telephone Encounter (Signed)
Pt is calling because she needs a refill on her Amitriptyline called in. She only has 1 pill left. jw

## 2015-11-26 MED ORDER — AMITRIPTYLINE HCL 150 MG PO TABS: ORAL_TABLET | ORAL | 5 refills | Status: DC

## 2015-12-04 ENCOUNTER — Ambulatory Visit (HOSPITAL_COMMUNITY)
Admission: RE | Admit: 2015-12-04 | Discharge: 2015-12-04 | Disposition: A | Discharge status: Home / Self Care | Payer: Medicare Other | Source: Ambulatory Visit | Attending: Gastroenterology | Admitting: Gastroenterology

## 2015-12-04 DIAGNOSIS — R1312 Dysphagia, oropharyngeal phase: Secondary | ICD-10-CM | POA: Diagnosis not present

## 2015-12-04 DIAGNOSIS — R1310 Dysphagia, unspecified: Secondary | ICD-10-CM | POA: Diagnosis not present

## 2015-12-16 NOTE — Progress Notes (Signed)
BPE normal. I feel she needs to be referred to [facility] due to persistent dysphagia symptoms. May need manometry or other testing. She has been thoroughly evaluated by [place] and ENT.

## 2015-12-22 ENCOUNTER — Other Ambulatory Visit: Payer: Self-pay | Admitting: Nurse Practitioner

## 2015-12-23 ENCOUNTER — Other Ambulatory Visit: Payer: Self-pay

## 2015-12-23 DIAGNOSIS — R1310 Dysphagia, unspecified: Secondary | ICD-10-CM

## 2016-02-06 ENCOUNTER — Ambulatory Visit (INDEPENDENT_AMBULATORY_CARE_PROVIDER_SITE_OTHER): Payer: Medicare Other | Admitting: Specialist

## 2016-02-17 ENCOUNTER — Other Ambulatory Visit: Payer: Self-pay | Admitting: Internal Medicine

## 2016-02-17 DIAGNOSIS — E7800 Pure hypercholesterolemia, unspecified: Secondary | ICD-10-CM

## 2016-02-24 ENCOUNTER — Other Ambulatory Visit: Payer: Self-pay | Admitting: Family Medicine

## 2016-02-24 DIAGNOSIS — M159 Polyosteoarthritis, unspecified: Secondary | ICD-10-CM

## 2016-02-25 ENCOUNTER — Other Ambulatory Visit: Payer: Self-pay | Admitting: *Deleted

## 2016-02-25 ENCOUNTER — Other Ambulatory Visit: Payer: Self-pay

## 2016-02-26 MED ORDER — DULOXETINE HCL 60 MG PO CPEP: 60.0000 mg | ORAL_CAPSULE | Freq: Two times a day (BID) | ORAL | 11 refills | Status: DC

## 2016-02-27 ENCOUNTER — Other Ambulatory Visit: Payer: Self-pay | Admitting: Internal Medicine

## 2016-02-27 DIAGNOSIS — M48062 Spinal stenosis, lumbar region with neurogenic claudication: Secondary | ICD-10-CM

## 2016-03-01 ENCOUNTER — Ambulatory Visit: Payer: Medicare Other | Admitting: Internal Medicine

## 2016-03-03 ENCOUNTER — Other Ambulatory Visit: Payer: Self-pay | Admitting: Family Medicine

## 2016-03-03 DIAGNOSIS — I10 Essential (primary) hypertension: Secondary | ICD-10-CM

## 2016-03-05 ENCOUNTER — Other Ambulatory Visit: Payer: Self-pay | Admitting: *Deleted

## 2016-03-05 DIAGNOSIS — I10 Essential (primary) hypertension: Secondary | ICD-10-CM

## 2016-03-05 MED ORDER — HYDROCHLOROTHIAZIDE 25 MG PO TABS: 25.0000 mg | ORAL_TABLET | Freq: Every day | ORAL | 2 refills | Status: DC

## 2016-04-14 ENCOUNTER — Telehealth: Payer: Self-pay | Admitting: Internal Medicine

## 2016-04-14 ENCOUNTER — Encounter: Payer: Self-pay | Admitting: Internal Medicine

## 2016-04-14 ENCOUNTER — Ambulatory Visit (INDEPENDENT_AMBULATORY_CARE_PROVIDER_SITE_OTHER): Payer: Medicare Other | Admitting: Internal Medicine

## 2016-04-14 VITALS — BP 140/56 | HR 80 | Temp 97.7°F | Wt 176.6 lb

## 2016-04-14 DIAGNOSIS — R5382 Chronic fatigue, unspecified: Secondary | ICD-10-CM

## 2016-04-14 DIAGNOSIS — R300 Dysuria: Secondary | ICD-10-CM

## 2016-04-14 DIAGNOSIS — R5383 Other fatigue: Secondary | ICD-10-CM

## 2016-04-14 DIAGNOSIS — R05 Cough: Secondary | ICD-10-CM | POA: Diagnosis not present

## 2016-04-14 DIAGNOSIS — R5381 Other malaise: Secondary | ICD-10-CM | POA: Diagnosis not present

## 2016-04-14 DIAGNOSIS — R053 Chronic cough: Secondary | ICD-10-CM

## 2016-04-14 LAB — POCT URINALYSIS DIP (MANUAL ENTRY)
BILIRUBIN UA: NEGATIVE
Bilirubin, UA: NEGATIVE
Glucose, UA: NEGATIVE
Nitrite, UA: NEGATIVE
PH UA: 7 (ref 5.0–8.0)
Protein Ur, POC: NEGATIVE
Spec Grav, UA: 1.01 (ref 1.030–1.035)
UROBILINOGEN UA: 4 — AB (ref ?–2.0)

## 2016-04-14 LAB — POCT UA - MICROSCOPIC ONLY

## 2016-04-14 MED ORDER — SULFAMETHOXAZOLE-TRIMETHOPRIM 800-160 MG PO TABS: 1.0000 | ORAL_TABLET | Freq: Two times a day (BID) | ORAL | 0 refills | Status: DC

## 2016-04-14 MED ORDER — PHENAZOPYRIDINE HCL 100 MG PO TABS: 100.0000 mg | ORAL_TABLET | Freq: Three times a day (TID) | ORAL | 0 refills | Status: DC | PRN

## 2016-04-14 NOTE — Assessment & Plan Note (Signed)
Reported symptoms consistent with UTI. UA with blood and leuks. Also with urobilinogen. No elevated nitrites to possibly explain presence of urobilinogen. CBC has been obtained to further investigate this.  - Bactrim BID x3d - Pyridium TID PRN - Return if no improvement after abx

## 2016-04-14 NOTE — Telephone Encounter (Signed)
Pt saw PCP this morning and was prescribed pain medication. Generic was $27 and pt can not afford that and would like to have something else called in. [facility] [place] ep

## 2016-04-14 NOTE — Telephone Encounter (Signed)
Please let patient know unfortunately there is not another option for this medication. It is for symptomatic relief only and is not required, so she does not have to take it. The pain should start to improve soon after beginning antibiotics.

## 2016-04-14 NOTE — Patient Instructions (Addendum)
It was nice seeing you again today Ms. Uplinger!  For your urinary tract infection, please begin taking the antibiotic (Bactrim) every 12 hours for the next 3 days. You can also take one pyridium tablet up to three times a day as needed for pain with urination. If you are still having symptoms after finishing the medication, please call to schedule another appointment.   Please go get the chest xray at your earliest convenience. I will let you know when we get the results. I will also let you know if there are any abnormalities with the labwork we collected today.   If you have any questions or concerns, please feel free to call the clinic.   Be well,  Dr. Avon Gully

## 2016-04-14 NOTE — Progress Notes (Signed)
   Subjective:   Patient: [name]       Birthdate: 1960-09-01       MRN: [id number]      HPI  Meagan A Cushman is a 56 y.o. female presenting for dysuria and two months of general malaise.   Dysuria Began sometime last week. Patient reports significant pain with urination, and a sharp pain when ending urine stream. She spoke to a pharmacist about this last week who recommended Azo. She took this for two days and thinks her symptoms worsened. She denies any hematuria, increased frequency. Said she is able to fully empty her bladder when urinating. Denies fevers, chills. Reported decreased appetite but beginning prior to these symptoms. Reports history of bladder prolapse requiring sling placement about 10 years ago. She does not know who performed this procedure and does not follow with them anymore.  Malaise For past two months patient has felt generally unwell. She says she is normally very active, but has not had the energy to do anything for the past two months. Reports unintentional weight loss, but has actually gained three pounds since last recorded weight in 10/2015. Also reports new difficulty breathing for the past two months, with wheezing and productive cough. She has an inhaler which she has been using at least twice daily. Has noticed difficulty breathing even when seated. She is a former smoker with 37.5 pack year history. She quit in 1997 after she was diagnosed with lung cancer. Said she had a "quarter sized piece" of her L lung removed but never had chemo or radiation. She does not follow with oncology.   Smoking status reviewed. Patient is former smoker.   Review of Systems See HPI.     Objective:  Physical Exam  Constitutional: She is oriented to person, place, and time and well-developed, well-nourished, and in no distress.  HENT:  Head: Normocephalic and atraumatic.  Mouth/Throat: Oropharynx is clear and moist. No oropharyngeal exudate.  Eyes: Conjunctivae and EOM are  normal. Right eye exhibits no discharge. Left eye exhibits no discharge.  Cardiovascular: Normal rate, regular rhythm and normal heart sounds.   No murmur heard. Pulmonary/Chest: Effort normal and breath sounds normal. No respiratory distress. She has no wheezes.  Abdominal: Soft. Bowel sounds are normal. She exhibits no distension. There is no tenderness.  Neurological: She is alert and oriented to person, place, and time.  Skin: Skin is warm and dry.  Psychiatric: Affect and judgment normal.      Assessment & Plan:  Dysuria Reported symptoms consistent with UTI. UA with blood and leuks. Also with urobilinogen. No elevated nitrites to possibly explain presence of urobilinogen. CBC has been obtained to further investigate this.  - Bactrim BID x3d - Pyridium TID PRN - Return if no improvement after abx  Malaise and fatigue Ongoing for past two months. Patient reports unintentional weight loss, though recorded weight at clinic actually show three pound weight gain since 10/2015. Patient does have significant smoking history and history of malignancy, so new onset cough concerning for possible new malignancy.  - TSH, CBC, BMP - CXR   Adin Hector, MD, MPH PGY-2 [facility] Pager [phone]

## 2016-04-14 NOTE — Assessment & Plan Note (Addendum)
Ongoing for past two months. Patient reports unintentional weight loss, though recorded weight at clinic actually show three pound weight gain since 10/2015. Patient does have significant smoking history and history of malignancy, so new onset cough concerning for possible new malignancy.  - TSH, CBC, BMP - CXR

## 2016-04-15 ENCOUNTER — Ambulatory Visit (HOSPITAL_COMMUNITY)
Admission: RE | Admit: 2016-04-15 | Discharge: 2016-04-15 | Disposition: A | Discharge status: Home / Self Care | Payer: Medicare Other | Source: Ambulatory Visit | Attending: Family Medicine | Admitting: Family Medicine

## 2016-04-15 ENCOUNTER — Other Ambulatory Visit: Payer: Self-pay | Admitting: Internal Medicine

## 2016-04-15 ENCOUNTER — Telehealth: Payer: Self-pay | Admitting: Internal Medicine

## 2016-04-15 DIAGNOSIS — R918 Other nonspecific abnormal finding of lung field: Secondary | ICD-10-CM | POA: Diagnosis not present

## 2016-04-15 DIAGNOSIS — Z9889 Other specified postprocedural states: Secondary | ICD-10-CM | POA: Insufficient documentation

## 2016-04-15 DIAGNOSIS — R05 Cough: Secondary | ICD-10-CM | POA: Insufficient documentation

## 2016-04-15 DIAGNOSIS — J40 Bronchitis, not specified as acute or chronic: Secondary | ICD-10-CM | POA: Diagnosis not present

## 2016-04-15 DIAGNOSIS — R053 Chronic cough: Secondary | ICD-10-CM

## 2016-04-15 LAB — CBC
HEMOGLOBIN: 12.8 g/dL (ref 11.1–15.9)
Hematocrit: 37.9 % (ref 34.0–46.6)
MCH: 31.1 pg (ref 26.6–33.0)
MCHC: 33.8 g/dL (ref 31.5–35.7)
MCV: 92 fL (ref 79–97)
Platelets: 317 10*3/uL (ref 150–379)
RBC: 4.12 x10E6/uL (ref 3.77–5.28)
RDW: 13.3 % (ref 12.3–15.4)
WBC: 6.2 10*3/uL (ref 3.4–10.8)

## 2016-04-15 LAB — BASIC METABOLIC PANEL
BUN/Creatinine Ratio: 9 (ref 9–23)
BUN: 8 mg/dL (ref 6–24)
CALCIUM: 9.4 mg/dL (ref 8.7–10.2)
CHLORIDE: 93 mmol/L — AB (ref 96–106)
CO2: 32 mmol/L — ABNORMAL HIGH (ref 18–29)
Creatinine, Ser: 0.88 mg/dL (ref 0.57–1.00)
GFR calc Af Amer: 87 mL/min/{1.73_m2} (ref >59)
GFR calc non Af Amer: 75 mL/min/{1.73_m2} (ref >59)
GLUCOSE: 114 mg/dL — AB (ref 65–99)
POTASSIUM: 3 mmol/L — AB (ref 3.5–5.2)
Sodium: 139 mmol/L (ref 134–144)

## 2016-04-15 LAB — TSH: TSH: 0.744 u[IU]/mL (ref 0.450–4.500)

## 2016-04-15 NOTE — Telephone Encounter (Signed)
Will forward to MD to change order for xray to reflect [place] as the location. Jazmin Hartsell,CMA

## 2016-04-15 NOTE — Telephone Encounter (Signed)
Dee needs the chest x-ray order for pt to be e-signed at [facility]. ep

## 2016-04-15 NOTE — Telephone Encounter (Signed)
CXR was ordered to be performed at [facility] yesterday and was performed at [facility] already this AM.

## 2016-04-15 NOTE — Telephone Encounter (Signed)
lmovm for pt to return call. Fleeger, Jessica Dawn, CMA  

## 2016-04-19 ENCOUNTER — Other Ambulatory Visit: Payer: Self-pay | Admitting: *Deleted

## 2016-04-19 ENCOUNTER — Other Ambulatory Visit: Payer: Self-pay | Admitting: Internal Medicine

## 2016-04-19 DIAGNOSIS — A609 Anogenital herpesviral infection, unspecified: Secondary | ICD-10-CM

## 2016-04-19 DIAGNOSIS — M48062 Spinal stenosis, lumbar region with neurogenic claudication: Secondary | ICD-10-CM

## 2016-04-19 MED ORDER — AMITRIPTYLINE HCL 150 MG PO TABS: ORAL_TABLET | ORAL | 5 refills | Status: DC

## 2016-04-20 ENCOUNTER — Telehealth: Payer: Self-pay | Admitting: Internal Medicine

## 2016-04-20 NOTE — Telephone Encounter (Signed)
Pt is calling for all her test results. Urine,lab, and chest x-rays. jw

## 2016-04-22 NOTE — Telephone Encounter (Signed)
Patient informed of normal labwork and CXR with mild bronchitis changes.   Adin Hector, MD, MPH PGY-2 [facility] Pager [phone]

## 2016-06-07 ENCOUNTER — Ambulatory Visit (INDEPENDENT_AMBULATORY_CARE_PROVIDER_SITE_OTHER): Payer: Medicare Other | Admitting: Internal Medicine

## 2016-06-07 ENCOUNTER — Other Ambulatory Visit: Payer: Self-pay

## 2016-06-07 ENCOUNTER — Encounter: Payer: Self-pay | Admitting: Internal Medicine

## 2016-06-07 DIAGNOSIS — K219 Gastro-esophageal reflux disease without esophagitis: Secondary | ICD-10-CM | POA: Diagnosis present

## 2016-06-07 DIAGNOSIS — M25551 Pain in right hip: Secondary | ICD-10-CM | POA: Diagnosis not present

## 2016-06-07 DIAGNOSIS — R748 Abnormal levels of other serum enzymes: Secondary | ICD-10-CM

## 2016-06-07 MED ORDER — MELOXICAM 15 MG PO TABS: 15.0000 mg | ORAL_TABLET | Freq: Every day | ORAL | 2 refills | Status: DC

## 2016-06-07 NOTE — Assessment & Plan Note (Signed)
Has identified triggers and knows that if she avoids these foods she will not have pain. No recent weight loss. No red flags.  - Avoid known trigger foods - Tums PRN - Continue taking Dexilant and Amitiza daily as prescribed

## 2016-06-07 NOTE — Assessment & Plan Note (Signed)
Likely OA with obesity contributing. Able to walk, sit, and stand unassisted without difficulty. Does not want to pursue further treatment at this time. Will manage conservatively for now.  - Mobic PRN

## 2016-06-07 NOTE — Patient Instructions (Addendum)
It was nice seeing you again today Ms. [name]!  Please continue to take your medications as you have been. I do not think you need to add an extra acid reflux medication at night as long as you are taking your prescribed medications every day. You could try taking Tums when you are experiencing heartburn, but avoiding the foods that you know cause your pain is the best way to prevent that.   I have prescribed meloxicam (Mobic) for your hip pain. Take this medication once a day as needed for pain. Do not take other anti-inflammatory medications, such as ibuprofen, Motrin, Aleve, or naproxem, at the same time at Mobic. You can take Tylenol and Mobic at the same time.   If you have any questions or concerns, please feel free to call the clinic.   Be well,  Dr. Avon Gully

## 2016-06-07 NOTE — Progress Notes (Signed)
   Subjective:   Patient: [name]       Birthdate: 1960/05/14       MRN: [id number]      HPI  [name] is a 56 y.o. female presenting for GERD and hip pain.   GERD Chronic issue. Taking Dexilant and Amitiza daily. Denies missing doses. Reports pain in mid epigastric region after eating hamburgers, hot dogs, and pizzas. Says she knows that if she stops eating these foods her pain will resolve. Endorses nausea, denies vomiting. Has had some watery diarrhea for the past couple days but none otherwise.   Hip pain Chronic issue. R hip. Most painful when laying on it, so sometimes wakes up at night if she has rolled over onto her R hip. Takes ibuprofen which helps sometimes. Has difficulty walking, but does not use assistance, though does own cane and walker. Is taking a family trip to the beach in July so does not want to do anything about her hip pain at this time, just wanted me to be aware of it.   Smoking status reviewed. Patient is former smoker.   Review of Systems See HPI.     Objective:  Physical Exam  Constitutional: She is oriented to person, place, and time and well-developed, well-nourished, and in no distress.  HENT:  Head: Normocephalic and atraumatic.  Eyes: Conjunctivae and EOM are normal. Right eye exhibits no discharge. Left eye exhibits no discharge.  Pulmonary/Chest: Effort normal. No respiratory distress.  Abdominal: Soft. Bowel sounds are normal. She exhibits no distension. There is no tenderness.  Musculoskeletal:  Mild tenderness to deep palpation of general R hip area. Able to walk, sit, and stand without assistance or difficulty. No gait abnormalities.   Neurological: She is alert and oriented to person, place, and time.  Skin: Skin is warm and dry.  Psychiatric: Affect and judgment normal.      Assessment & Plan:  GASTROESOPHAGEAL REFLUX, NO ESOPHAGITIS Has identified triggers and knows that if she avoids these foods she will not have pain. No recent  weight loss. No red flags.  - Avoid known trigger foods - Tums PRN - Continue taking Dexilant and Amitiza daily as prescribed  Right hip pain Likely OA with obesity contributing. Able to walk, sit, and stand unassisted without difficulty. Does not want to pursue further treatment at this time. Will manage conservatively for now.  - Mobic PRN   Adin Hector, MD, MPH PGY-2 [facility] Pager [phone]

## 2016-06-23 DIAGNOSIS — R748 Abnormal levels of other serum enzymes: Secondary | ICD-10-CM | POA: Diagnosis not present

## 2016-06-23 LAB — HEPATIC FUNCTION PANEL
ALBUMIN: 4 g/dL (ref 3.6–5.1)
ALT: 16 U/L (ref 6–29)
AST: 15 U/L (ref 10–35)
Alkaline Phosphatase: 154 U/L — ABNORMAL HIGH (ref 33–130)
Bilirubin, Direct: 0.1 mg/dL (ref <=0.2)
Indirect Bilirubin: 0.3 mg/dL (ref 0.2–1.2)
TOTAL PROTEIN: 7.2 g/dL (ref 6.1–8.1)
Total Bilirubin: 0.4 mg/dL (ref 0.2–1.2)

## 2016-06-29 NOTE — Progress Notes (Signed)
Chronically elevated alk phos, no significant change. Due for office visit.

## 2016-07-02 ENCOUNTER — Encounter: Payer: Self-pay | Admitting: Gastroenterology

## 2016-07-12 ENCOUNTER — Telehealth: Payer: Self-pay | Admitting: Internal Medicine

## 2016-07-12 NOTE — Telephone Encounter (Signed)
Spoke with patient regarding hip pain. Patient with no red flags, so discussed scheduling same day appointment tomorrow in clinic rather than going to ED. Patient amenable to this and said she would call tomorrow morning as soon as clinic opens to schedule same day appt.   Adin Hector, MD, MPH PGY-2 [facility] Pager [phone]

## 2016-07-12 NOTE — Telephone Encounter (Signed)
Pt is having a lot of hip pain, pt states she mentioned this to PCP before. A hip replacement was mentioned. Pt wants PCP to know that she is going to the ED tomorrow morning and maybe today if she can get over there, pt states she is in a lot of pain due to her hip. Pt would like PCP to call her. ep

## 2016-07-14 ENCOUNTER — Encounter: Payer: Self-pay | Admitting: Family Medicine

## 2016-07-14 ENCOUNTER — Ambulatory Visit (INDEPENDENT_AMBULATORY_CARE_PROVIDER_SITE_OTHER): Payer: Medicare Other | Admitting: Family Medicine

## 2016-07-14 VITALS — BP 130/70 | HR 62 | Temp 98.2°F | Ht 64.0 in | Wt 175.0 lb

## 2016-07-14 DIAGNOSIS — M8949 Other hypertrophic osteoarthropathy, multiple sites: Secondary | ICD-10-CM

## 2016-07-14 DIAGNOSIS — M150 Primary generalized (osteo)arthritis: Secondary | ICD-10-CM | POA: Diagnosis not present

## 2016-07-14 DIAGNOSIS — M169 Osteoarthritis of hip, unspecified: Secondary | ICD-10-CM | POA: Diagnosis present

## 2016-07-14 DIAGNOSIS — M159 Polyosteoarthritis, unspecified: Secondary | ICD-10-CM

## 2016-07-14 NOTE — Assessment & Plan Note (Signed)
Patient at end stage. She has evidence of OA from plain films 8 years ago. Will refer back to [place] for further management. Continue mobic until then.

## 2016-07-14 NOTE — Patient Instructions (Signed)
We will send you back to the orthopedist.  You can try giving them a call to speed things up.  Address: [address] [place], [place] [address] Phone: [phone]  Take care.  Dr Jerline Pain

## 2016-07-14 NOTE — Progress Notes (Signed)
   Subjective:  [name] is a 56 y.o. female who presents to the [facility] today with a chief complaint of left hip pain.   HPI:  Left Hip Pain Chronic problem for patient. She also has pain in her right hip secondary to OA. This has been a problem for several years. Pain is worse with movement. She has tried several medications in the past including mobic, tylenol, ibuprofen, aleve, and OTC patches which did not help much. Patient was told that she would likely need surgery and is ready to proceed with this.   ROS: Per HPI  PMH: Smoking history reviewed.   Objective:  Physical Exam: BP 130/70   Pulse 62   Temp 98.2 F (36.8 C) (Oral)   Ht 5\' 4"  (1.626 m)   Wt 175 lb (79.4 kg)   SpO2 96%   BMI 30.04 kg/m   Gen: NAD, resting comfortably MSK:  - LLE: No deformities. Hip ROM severely limited. About 20 degrees of external rotation and 30 degrees of internal rotation. Tender to palpation diffusely across hip joint - RLE: No deformities. ROM limited. 30 degrees of external rotation and 40 degrees of internal rotation. Skin: warm, dry  Assessment/Plan:  Osteoarthritis of multiple joints Patient at end stage. She has evidence of OA from plain films 8 years ago. Will refer back to [facility] for further management. Continue mobic until then.  Algis Greenhouse. Jerline Pain, [facility] Resident PGY-3 07/14/2016 10:31 AM

## 2016-07-19 ENCOUNTER — Other Ambulatory Visit: Payer: Self-pay | Admitting: Internal Medicine

## 2016-07-19 DIAGNOSIS — M48062 Spinal stenosis, lumbar region with neurogenic claudication: Secondary | ICD-10-CM

## 2016-08-18 ENCOUNTER — Ambulatory Visit: Payer: Medicare Other | Admitting: Gastroenterology

## 2016-08-22 ENCOUNTER — Other Ambulatory Visit: Payer: Self-pay | Admitting: Internal Medicine

## 2016-08-22 DIAGNOSIS — E7800 Pure hypercholesterolemia, unspecified: Secondary | ICD-10-CM

## 2016-08-27 ENCOUNTER — Ambulatory Visit (INDEPENDENT_AMBULATORY_CARE_PROVIDER_SITE_OTHER): Payer: Medicare Other | Admitting: Specialist

## 2016-08-27 ENCOUNTER — Encounter (INDEPENDENT_AMBULATORY_CARE_PROVIDER_SITE_OTHER): Payer: Self-pay | Admitting: Specialist

## 2016-08-27 ENCOUNTER — Ambulatory Visit (INDEPENDENT_AMBULATORY_CARE_PROVIDER_SITE_OTHER): Payer: Medicare Other

## 2016-08-27 VITALS — BP 129/73 | HR 81

## 2016-08-27 DIAGNOSIS — M25552 Pain in left hip: Secondary | ICD-10-CM

## 2016-08-27 DIAGNOSIS — M533 Sacrococcygeal disorders, not elsewhere classified: Secondary | ICD-10-CM | POA: Diagnosis not present

## 2016-08-27 MED ORDER — DIAZEPAM 5 MG PO TABS: ORAL_TABLET | ORAL | 0 refills | Status: DC

## 2016-08-27 MED ORDER — ACETAMINOPHEN-CODEINE #4 300-60 MG PO TABS: 1.0000 | ORAL_TABLET | Freq: Four times a day (QID) | ORAL | 0 refills | Status: DC | PRN

## 2016-08-27 NOTE — Patient Instructions (Addendum)
  hip may be suffering from osteoarthritis, only real proven treatments are Weight loss, NSIADs like alleve and exercise. Well padded shoes help. Ice the hip 2-3 times a day 15-20 mins at a time. MRI is ordered as the xrays not show severe arthritis as your exam would suggest and this will help [place] to understand the cause of the pain. Tylenol #4 one every 6 hours, no more than 6 tylenol tablets combined with the tyl#4 per day. Use a walker 50% weight bearing on the left leg.

## 2016-08-27 NOTE — Progress Notes (Addendum)
Office Visit Note   Patient: [name]           Date of Birth: 09/13/1960           MRN: [id number] Visit Date: 08/27/2016              Requested by: Vivi Barrack, MD [address] [place], [place] [address] PCP: Verner Mould, MD   Assessment & Plan: Visit Diagnoses:  1. Pain in left hip   2. Sacroiliac joint dysfunction of both sides     Plan: hip may be suffering from osteoarthritis, only real proven treatments are Weight loss, NSIADs like alleve and exercise. Well padded shoes help. Ice the hip 2-3 times a day 15-20 mins at a time. MRI is ordered as the xrays not show severe arthritis as your exam would suggest and this will help [place] to understand the cause of the pain. Tylenol #4 one every 6 hours, no more than 6 tylenol tablets combined with the tyl#4 per day. Use a walker 50% weight bearing on the left leg. Follow-Up Instructions: Return in about 2 weeks (around 09/10/2016) for overbook.   Orders:  Orders Placed This Encounter  Procedures  . XR HIP UNILAT W OR W/O PELVIS 1V LEFT  . MR Pelvis w/o contrast   Meds ordered this encounter  Medications  . acetaminophen-codeine (TYLENOL #4) 300-60 MG tablet    Sig: Take 1 tablet by mouth every 6 (six) hours as needed for moderate pain.    Dispense:  30 tablet    Refill:  0  . diazepam (VALIUM) 5 MG tablet    Sig: Take one tablet po before MRI and may repeat x 1.    Dispense:  2 tablet    Refill:  0      Procedures: No procedures performed   Clinical Data: No additional findings.   Subjective: Chief Complaint  Patient presents with  . Left Hip - Pain    56 year old female status post excision of a paracoccygeal nodue, benign,2 years ago and the then lumbar fusion 12/2014 and conccygectomy 3/17, then right CTR done 06/2015. Now with increasing left hip pain greater than right hip pain. Numbness and tingling left leg occasionally, below the knee into the left foot plantar aspect. Pain in the left  hip is a sharp aching, burning pain about 8-9 of 10. She had pain at night with rolling onto the left side. Pain with first standing and the first few steps, pain with weight bearing and with stairs and difficulty reaching her shoes. Her primary care MD, Dr. Avon Gully [facility] tried meloxicam for a few months without, and tylenol and alleve. No bowel or bladder difficulty. Not able to grocery shop for the last few weeks. Walking even in the house is painful and she is having to lie down. Feels stiff and feels a grinding sensation in the hip.    Review of Systems  Constitutional: Positive for activity change and appetite change. Negative for chills, diaphoresis, fatigue, fever and unexpected weight change.  HENT: Negative for congestion, dental problem, drooling, ear discharge, ear pain, facial swelling, mouth sores, postnasal drip, rhinorrhea, sinus pain, sinus pressure, sneezing, sore throat, tinnitus, trouble swallowing and voice change.   Eyes: Negative for photophobia, pain, discharge, redness, itching and visual disturbance.  Respiratory: Negative for apnea, cough, choking, chest tightness, shortness of breath, wheezing and stridor.   Cardiovascular: Positive for leg swelling. Negative for chest pain.  Gastrointestinal: Negative for abdominal distention,  abdominal pain, anal bleeding, blood in stool, constipation, diarrhea, nausea, rectal pain and vomiting.  Genitourinary: Negative for difficulty urinating, dyspareunia, dysuria, enuresis, flank pain, frequency and genital sores.  Musculoskeletal: Positive for arthralgias, gait problem and joint swelling. Negative for back pain, neck pain and neck stiffness.  Skin: Negative for color change, pallor, rash and wound.  Allergic/Immunologic: Negative for environmental allergies, food allergies and immunocompromised state.  Neurological: Positive for weakness and numbness. Negative for dizziness, tremors, seizures, syncope, facial asymmetry, speech  difficulty, light-headedness and headaches.  Hematological: Negative for adenopathy. Does not bruise/bleed easily.  Psychiatric/Behavioral: Positive for dysphoric mood and sleep disturbance. Negative for agitation, behavioral problems, confusion, decreased concentration, hallucinations, self-injury and suicidal ideas. The patient is not nervous/anxious and is not hyperactive.      Objective: Vital Signs: BP 129/73   Pulse 81   Physical Exam  Constitutional: She is oriented to person, place, and time. She appears well-developed and well-nourished.  HENT:  Head: Normocephalic and atraumatic.  Eyes: Pupils are equal, round, and reactive to light. EOM are normal.  Neck: Normal range of motion. Neck supple.  Pulmonary/Chest: Effort normal and breath sounds normal.  Abdominal: Soft. Bowel sounds are normal.  Neurological: She is alert and oriented to person, place, and time.  Skin: Skin is warm and dry.  Psychiatric: She has a normal mood and affect. Her behavior is normal. Judgment and thought content normal.    Right Hip Exam   Range of Motion  Extension: normal  Flexion: 130  Internal Rotation: 20  External Rotation: 50  Abduction: normal  Adduction: normal   Muscle Strength  Abduction: 5/5  Adduction: 5/5  Flexion: 5/5   Tests  FABER: negative Ober: negative  Other  Erythema: absent Scars: present Sensation: normal Pulse: present   Left Hip Exam   Tenderness  The patient is experiencing tenderness in the greater trochanter and anterior.  Range of Motion  Extension: abnormal  Flexion:  90 abnormal  Internal Rotation: 10  External Rotation: 30  Abduction: abnormal  Adduction: abnormal   Muscle Strength  Abduction: 5/5  Adduction: 5/5  Flexion: 5/5   Tests  FABER: positive Ober: positive  Other  Erythema: absent Scars: absent Sensation: normal Pulse: present   Back Exam   Range of Motion  Extension: abnormal  Flexion: abnormal  Lateral  Bend Right: abnormal  Lateral Bend Left: abnormal  Rotation Right: abnormal  Rotation Left: abnormal   Muscle Strength  Right Quadriceps:  5/5  Left Quadriceps:  5/5  Right Hamstrings:  5/5  Left Hamstrings:  5/5   Tests  Straight leg raise right: negative Straight leg raise left: negative  Reflexes  Patellar: 2/4 Achilles: 2/4 Babinski's sign: normal   Other  Toe Walk: abnormal Heel Walk: abnormal Sensation: normal Gait: Trendelenburg       Specialty Comments:  No specialty comments available.  Imaging: Xr Hip Unilat W Or W/o Pelvis 1v Left  Result Date: 08/27/2016 [facility] pelvis and lateral of the left hip with spherical left femoral head and the left hip joint line appears intact. Minimal cortical lucency seen along the medial left mid lesser trochanter on the [facility] radiograph,not confirmed on the lateral radiograph. Previous CT of the pelvis from 05/2014 shoes minimal spurs along the medial left femoral head at the fovea. Calcification of the left posterior labrum and mild spur off  The superolateral acetabulum.     PMFS History: Patient Active Problem List   Diagnosis Date Noted  . Mass  of left forearm 10/10/2015    Priority: High    Class: Chronic  . Carpal tunnel syndrome, right 07/11/2015    Priority: High    Class: Chronic  . Spondylolisthesis of lumbar region 01/03/2015    Priority: High    Class: Chronic  . Spinal stenosis, lumbar region, with neurogenic claudication 01/03/2015    Priority: High    Class: Chronic  . Soft tissue mass 07/23/2014    Priority: High    Class: Acute  . Right hip pain 06/07/2016  . Dysuria 04/14/2016  . Malaise and fatigue 04/14/2016  . Breast pain, left 11/06/2015  . Dysphagia, oropharyngeal   . Gastroesophageal reflux disease without esophagitis   . Prediabetes 07/09/2015  . Lumbar stenosis with neurogenic claudication 01/05/2015  . Coccygodynia 07/23/2014    Class: Acute  . Healthcare maintenance 03/19/2014  .  Dysphagia, pharyngoesophageal phase   . Obesity, unspecified 02/15/2013  . Genital herpes 10/12/2012  . HSV (herpes simplex virus) anogenital infection 09/29/2012  . UNSPECIFIED SLEEP APNEA 12/04/2008  . ADENOCARCINOMA, LUNG 10/09/2008  . Osteoarthritis of multiple joints 07/13/2006  . HYPERCHOLESTEROLEMIA 03/17/2006  . DEPRESSION, MAJOR, RECURRENT 03/17/2006  . HYPERTENSION, BENIGN SYSTEMIC 03/17/2006  . GASTROESOPHAGEAL REFLUX, NO ESOPHAGITIS 03/17/2006  . INSOMNIA NOS 03/17/2006   Past Medical History:  Diagnosis Date  . Anxiety   . Asthma    every now and then.  Wheezing and coughing  . Cancer ([place]) 1997   left lung  . Chronic abdominal pain   . Chronic back pain   . Chronic joint pain   . Depression   . GERD (gastroesophageal reflux disease)   . HLD (hyperlipidemia)   . HTN (hypertension)   . Nausea and vomiting    chronic, recurrent  . Osteoarthrosis involving more than one site but not generalized   . Palpitations 01/2013   chronic, intermittent  . Shortness of breath dyspnea    with exertion    Family History  Problem Relation Age of Onset  . Diabetes Other   . Heart disease Other        has Psychologist, forensic  . Hypertension Mother   . Kidney disease Mother   . Hypertension Father   . Kidney disease Father   . Heart disease Father   . Cancer Father        leukemia  . Hypertension Sister   . Hypertension Sister   . Colon cancer Neg Hx     Past Surgical History:  Procedure Laterality Date  . ABDOMINAL HYSTERECTOMY    . ABDOMINAL SURGERY     ovarian cyst removal  . BIOPSY N/A 11/22/2013   Procedure: GASTRIC BIOPSY;  Surgeon: Daneil Dolin, MD;  Location: [facility];  Service: Endoscopy;  Laterality: N/A;  . BLADDER SURGERY  suspension x4   x 4  . CARPAL TUNNEL RELEASE Right 07/11/2015   Procedure: RIGHT CARPAL TUNNEL RELEASE;  Surgeon: Jessy Oto, MD;  Location: [facility];  Service: Orthopedics;  Laterality: Right;  . CHOLECYSTECTOMY N/A  04/05/2014   Procedure: LAPAROSCOPIC CHOLECYSTECTOMY;  Surgeon: Aviva Signs Md, MD;  Location: [facility];  Service: General;  Laterality: N/A;  . COCCYGECTOMY N/A 04/11/2015   Procedure: COCCYGECTOMY WITH OSTEOTOMY THROUGH AREA OF DEFORMITY;  Surgeon: Jessy Oto, MD;  Location: [facility];  Service: Orthopedics;  Laterality: N/A;  . COLONOSCOPY    . COLONOSCOPY WITH PROPOFOL N/A 11/22/2013   Dr. Gala Romney: Grade 3 and 4/internal hemorrhoids?"likely source of hematochezia Normal colonoscopy  otherwise.   . ESOPHAGOGASTRODUODENOSCOPY (EGD) WITH PROPOFOL N/A 11/22/2013   Dr. Gala Romney: Incomplete Schatzki's ring dilated and disrupted as described above.  Small hiatal hernia. Focally abnormal gastric mucosa of uncertain significance status post biopsy, reactive gastropathy, negative H.pylori  . ESOPHAGOGASTRODUODENOSCOPY (EGD) WITH PROPOFOL N/A 08/04/2015   Dr. Gala Romney: mild Schatzki's ring s/p dilation, small hiatal hernia   . EXCISION VAGINAL CYST Left 06/20/2012   Procedure: EXCISION LEFT LABIAL CYST;  Surgeon: Jonnie Kind, MD;  Location: [facility];  Service: Gynecology;  Laterality: Left;  . LOBECTOMY Left   . LUNG CANCER SURGERY  1997   age 35, resection only  . MALONEY DILATION N/A 11/22/2013   Procedure: [name] DILATION;  Surgeon: Daneil Dolin, MD;  Location: [facility];  Service: Endoscopy;  Laterality: N/A;  56  . MALONEY DILATION N/A 08/04/2015   Procedure: [name] DILATION;  Surgeon: Daneil Dolin, MD;  [facility];  Service: Endoscopy;  Laterality: N/A;  . MASS EXCISION N/A 07/23/2014   Procedure: EXCISIONAL BIOPSY NODULE LEFT PARACOCCYGEAL AREA;  Surgeon: Jessy Oto, MD;  Location: [facility];  Service: Orthopedics;  Laterality: N/A;  . MASS EXCISION Left 10/10/2015   Procedure: EXCISIONAL BIOPSY OF LEFT DORSORADIAL FOREARM MASS LIPOMA;  Surgeon: Jessy Oto, MD;  Location: [facility];  Service: Orthopedics;  Laterality: Left;  . TUBAL LIGATION     Social History   Occupational  History  . Not on file.   Social History Main Topics  . Smoking status: Former Smoker    Packs/day: 1.50    Years: 25.00    Types: Cigarettes    Quit date: 04/07/1995  . Smokeless tobacco: Never Used  . Alcohol use No  . Drug use: No  . Sexual activity: Not Currently    Birth control/ protection: Surgical

## 2016-09-03 ENCOUNTER — Ambulatory Visit (HOSPITAL_COMMUNITY)
Admission: RE | Admit: 2016-09-03 | Discharge: 2016-09-03 | Disposition: A | Discharge status: Home / Self Care | Payer: Medicare Other | Source: Ambulatory Visit | Attending: Specialist | Admitting: Specialist

## 2016-09-03 DIAGNOSIS — M1612 Unilateral primary osteoarthritis, left hip: Secondary | ICD-10-CM | POA: Diagnosis not present

## 2016-09-03 DIAGNOSIS — M25552 Pain in left hip: Secondary | ICD-10-CM | POA: Diagnosis not present

## 2016-09-10 ENCOUNTER — Ambulatory Visit (INDEPENDENT_AMBULATORY_CARE_PROVIDER_SITE_OTHER): Payer: Medicare Other | Admitting: Specialist

## 2016-09-10 VITALS — BP 138/65 | HR 71

## 2016-09-10 DIAGNOSIS — M533 Sacrococcygeal disorders, not elsewhere classified: Secondary | ICD-10-CM | POA: Diagnosis not present

## 2016-09-10 DIAGNOSIS — S76012A Strain of muscle, fascia and tendon of left hip, initial encounter: Secondary | ICD-10-CM

## 2016-09-10 MED ORDER — METHYLPREDNISOLONE 4 MG PO TBPK: ORAL_TABLET | ORAL | 0 refills | Status: DC

## 2016-09-10 NOTE — Patient Instructions (Signed)
The main ways of treat osteoarthritis, that are found to be success. Weight loss helps to decrease pain. Exercise is important to maintaining cartilage and thickness and strengthening. NSAIDs like motrin, tylenol, alleve are meds decreasing the inflamation. Ice is okay  In afternoon and evening and hot shower in the am Will call in a medrol dose pak for joint pain. Call you primary care Dr. Avon Gully for an appt to evaluate the cause of your diarrhea. Physical therapy for eval and treatment of MRI findings of left hip muscle strain and SI joint Degeneration.

## 2016-09-10 NOTE — Progress Notes (Addendum)
Office Visit Note   Patient: [name]           Date of Birth: 07-11-1960           MRN: [id number] Visit Date: 09/10/2016              Requested by: Verner Mould, MD [address] [place], [place] [address] PCP: Verner Mould, MD   Assessment & Plan: Visit Diagnoses:  1. Sacroiliac joint disease   2. Hip strain, left, initial encounter    56 year old female with pain in the left hip, she has had persistent pain and discomfort. MRI was done to rule out a stress fracture and allowed Assessment of apparent soft tissue pathology involving the muscle tendons about the left hip consistent with hip muscle strain and tendonitis. Will treat with therapy, no surgical solution here. She is having significant diarrhea issues and I have recommend following up with her primary Care physician.   Plan: The main ways of treat osteoarthritis, that are found to be success. Weight loss helps to decrease pain. Exercise is important to maintaining cartilage and thickness and strengthening. NSAIDs like motrin, tylenol, alleve are meds decreasing the inflamation. Ice is okay  In afternoon and evening and hot shower in the am Will call in a medrol dose pak for joint pain. Call you primary care Dr. Avon Gully for an appt to evaluate the cause of your diarrhea. Physical therapy for eval and treatment of MRI findings of left hip muscle strain and SI joint Degeneration.  Follow-Up Instructions: Return in about 4 weeks (around 10/08/2016).   Orders:  No orders of the defined types were placed in this encounter.  No orders of the defined types were placed in this encounter.     Procedures: No procedures performed   Clinical Data: No additional findings.   Subjective: Chief Complaint  Patient presents with  . Left Hip - Pain    56 year old female with ongoing left hip pain. History of L3-4 fusion for spinal stenosis and previous coccycectomy for coccygodynia. Now  with problems of falling out of bed x 3, making the bed and she moved to fast and fell from the bed landing on the left hip. MRI was done and she returns to follow up of this persistent pain. She experiences left hip pain with lying on the left side and with standing and walking.    Review of Systems  Constitutional: Positive for activity change, fatigue and unexpected weight change.  HENT: Negative for congestion, dental problem, drooling, ear discharge, ear pain, facial swelling, hearing loss, mouth sores, nosebleeds, postnasal drip, rhinorrhea, sinus pain, sinus pressure, sneezing, sore throat, tinnitus, trouble swallowing and voice change.   Eyes: Positive for discharge. Negative for photophobia, pain and redness.  Respiratory: Negative.   Cardiovascular: Negative.   Gastrointestinal: Positive for diarrhea and rectal pain. Negative for abdominal distention, abdominal pain and vomiting.  Genitourinary: Negative.   Musculoskeletal: Positive for back pain.  Skin: Negative for color change, pallor, rash and wound.  Neurological: Negative for dizziness, tremors, seizures, syncope, facial asymmetry, speech difficulty, weakness, light-headedness, numbness and headaches.  Hematological: Does not bruise/bleed easily.  Psychiatric/Behavioral: Negative for agitation, behavioral problems, confusion, decreased concentration, dysphoric mood, hallucinations, self-injury, sleep disturbance and suicidal ideas. The patient is not nervous/anxious and is not hyperactive.      Objective: Vital Signs: BP 138/65   Pulse 71   Physical Exam  Constitutional: She is oriented to person, place,  and time. She appears well-developed and well-nourished.  HENT:  Head: Normocephalic and atraumatic.  Eyes: Pupils are equal, round, and reactive to light. EOM are normal.  Neck: Normal range of motion. Neck supple.  Pulmonary/Chest: Effort normal and breath sounds normal.  Abdominal: Soft. Bowel sounds are normal.    Neurological: She is alert and oriented to person, place, and time.  Skin: Skin is warm and dry.  Psychiatric: She has a normal mood and affect. Her behavior is normal. Judgment and thought content normal.    Left Hip Exam   Tenderness  The patient is experiencing tenderness in the greater trochanter and posterior.  Range of Motion  Flexion: 100  Internal Rotation: 20  External Rotation: 30  Abduction: abnormal  Adduction: normal   Muscle Strength  Abduction: 5/5  Adduction: 5/5  Flexion: 5/5   Tests  FABER: negative Ober: negative  Other  Erythema: absent Scars: absent   Back Exam   Tenderness  The patient is experiencing tenderness in the lumbar and sacroiliac.  Range of Motion  Extension: abnormal  Flexion: abnormal  Lateral Bend Right: abnormal  Lateral Bend Left: abnormal  Rotation Right: abnormal  Rotation Left: abnormal   Muscle Strength  Right Quadriceps:  5/5  Left Quadriceps:  5/5  Right Hamstrings:  5/5  Left Hamstrings:  5/5   Tests  Straight leg raise right: negative Straight leg raise left: negative  Reflexes  Patellar: normal Achilles: normal Biceps: normal Babinski's sign: normal   Other  Toe Walk: normal Heel Walk: normal Sensation: normal Gait: normal       Specialty Comments:  No specialty comments available.  Imaging: No results found.   PMFS History: Patient Active Problem List   Diagnosis Date Noted  . Mass of left forearm 10/10/2015    Priority: High    Class: Chronic  . Carpal tunnel syndrome, right 07/11/2015    Priority: High    Class: Chronic  . Spondylolisthesis of lumbar region 01/03/2015    Priority: High    Class: Chronic  . Spinal stenosis, lumbar region, with neurogenic claudication 01/03/2015    Priority: High    Class: Chronic  . Soft tissue mass 07/23/2014    Priority: High    Class: Acute  . Right hip pain 06/07/2016  . Dysuria 04/14/2016  . Malaise and fatigue 04/14/2016  . Breast  pain, left 11/06/2015  . Dysphagia, oropharyngeal   . Gastroesophageal reflux disease without esophagitis   . Prediabetes 07/09/2015  . Lumbar stenosis with neurogenic claudication 01/05/2015  . Coccygodynia 07/23/2014    Class: Acute  . Healthcare maintenance 03/19/2014  . Dysphagia, pharyngoesophageal phase   . Obesity, unspecified 02/15/2013  . Genital herpes 10/12/2012  . HSV (herpes simplex virus) anogenital infection 09/29/2012  . UNSPECIFIED SLEEP APNEA 12/04/2008  . ADENOCARCINOMA, LUNG 10/09/2008  . Osteoarthritis of multiple joints 07/13/2006  . HYPERCHOLESTEROLEMIA 03/17/2006  . DEPRESSION, MAJOR, RECURRENT 03/17/2006  . HYPERTENSION, BENIGN SYSTEMIC 03/17/2006  . GASTROESOPHAGEAL REFLUX, NO ESOPHAGITIS 03/17/2006  . INSOMNIA NOS 03/17/2006   Past Medical History:  Diagnosis Date  . Anxiety   . Asthma    every now and then.  Wheezing and coughing  . Cancer ([place]) 1997   left lung  . Chronic abdominal pain   . Chronic back pain   . Chronic joint pain   . Depression   . GERD (gastroesophageal reflux disease)   . HLD (hyperlipidemia)   . HTN (hypertension)   . Nausea and vomiting  chronic, recurrent  . Osteoarthrosis involving more than one site but not generalized   . Palpitations 01/2013   chronic, intermittent  . Shortness of breath dyspnea    with exertion    Family History  Problem Relation Age of Onset  . Diabetes Other   . Heart disease Other        has Psychologist, forensic  . Hypertension Mother   . Kidney disease Mother   . Hypertension Father   . Kidney disease Father   . Heart disease Father   . Cancer Father        leukemia  . Hypertension Sister   . Hypertension Sister   . Colon cancer Neg Hx     Past Surgical History:  Procedure Laterality Date  . ABDOMINAL HYSTERECTOMY    . ABDOMINAL SURGERY     ovarian cyst removal  . BIOPSY N/A 11/22/2013   Procedure: GASTRIC BIOPSY;  Surgeon: Daneil Dolin, MD;  Location: [facility];  Service: Endoscopy;   Laterality: N/A;  . BLADDER SURGERY  suspension x4   x 4  . CARPAL TUNNEL RELEASE Right 07/11/2015   Procedure: RIGHT CARPAL TUNNEL RELEASE;  Surgeon: Jessy Oto, MD;  Location: [facility];  Service: Orthopedics;  Laterality: Right;  . CHOLECYSTECTOMY N/A 04/05/2014   Procedure: LAPAROSCOPIC CHOLECYSTECTOMY;  Surgeon: Aviva Signs Md, MD;  Location: [facility];  Service: General;  Laterality: N/A;  . COCCYGECTOMY N/A 04/11/2015   Procedure: COCCYGECTOMY WITH OSTEOTOMY THROUGH AREA OF DEFORMITY;  Surgeon: Jessy Oto, MD;  Location: [facility];  Service: Orthopedics;  Laterality: N/A;  . COLONOSCOPY    . COLONOSCOPY WITH PROPOFOL N/A 11/22/2013   Dr. Gala Romney: Grade 3 and 4/internal hemorrhoids?"likely source of hematochezia Normal colonoscopy otherwise.   . ESOPHAGOGASTRODUODENOSCOPY (EGD) WITH PROPOFOL N/A 11/22/2013   Dr. Gala Romney: Incomplete Schatzki's ring dilated and disrupted as described above.  Small hiatal hernia. Focally abnormal gastric mucosa of uncertain significance status post biopsy, reactive gastropathy, negative H.pylori  . ESOPHAGOGASTRODUODENOSCOPY (EGD) WITH PROPOFOL N/A 08/04/2015   Dr. Gala Romney: mild Schatzki's ring s/p dilation, small hiatal hernia   . EXCISION VAGINAL CYST Left 06/20/2012   Procedure: EXCISION LEFT LABIAL CYST;  Surgeon: Jonnie Kind, MD;  Location: [facility];  Service: Gynecology;  Laterality: Left;  . LOBECTOMY Left   . LUNG CANCER SURGERY  1997   age 65, resection only  . MALONEY DILATION N/A 11/22/2013   Procedure: [name] DILATION;  Surgeon: Daneil Dolin, MD;  Location: [facility];  Service: Endoscopy;  Laterality: N/A;  56  . MALONEY DILATION N/A 08/04/2015   Procedure: [name] DILATION;  Surgeon: Daneil Dolin, MD;  [facility];  Service: Endoscopy;  Laterality: N/A;  . MASS EXCISION N/A 07/23/2014   Procedure: EXCISIONAL BIOPSY NODULE LEFT PARACOCCYGEAL AREA;  Surgeon: Jessy Oto, MD;  Location: [facility];  Service: Orthopedics;   Laterality: N/A;  . MASS EXCISION Left 10/10/2015   Procedure: EXCISIONAL BIOPSY OF LEFT DORSORADIAL FOREARM MASS LIPOMA;  Surgeon: Jessy Oto, MD;  Location: [facility];  Service: Orthopedics;  Laterality: Left;  . TUBAL LIGATION     Social History   Occupational History  . Not on file.   Social History Main Topics  . Smoking status: Former Smoker    Packs/day: 1.50    Years: 25.00    Types: Cigarettes    Quit date: 04/07/1995  . Smokeless tobacco: Never Used  . Alcohol use No  .  Drug use: No  . Sexual activity: Not Currently    Birth control/ protection: Surgical

## 2016-09-22 ENCOUNTER — Other Ambulatory Visit: Payer: Self-pay | Admitting: Gastroenterology

## 2016-10-07 ENCOUNTER — Ambulatory Visit (INDEPENDENT_AMBULATORY_CARE_PROVIDER_SITE_OTHER): Payer: Medicare Other | Admitting: Gastroenterology

## 2016-10-07 ENCOUNTER — Encounter: Payer: Self-pay | Admitting: Gastroenterology

## 2016-10-07 VITALS — BP 122/66 | HR 75 | Temp 97.9°F | Ht 65.0 in | Wt 175.2 lb

## 2016-10-07 DIAGNOSIS — R197 Diarrhea, unspecified: Secondary | ICD-10-CM

## 2016-10-07 DIAGNOSIS — R1314 Dysphagia, pharyngoesophageal phase: Secondary | ICD-10-CM

## 2016-10-07 MED ORDER — DICYCLOMINE HCL 10 MG PO CAPS: 10.0000 mg | ORAL_CAPSULE | Freq: Three times a day (TID) | ORAL | 3 refills | Status: DC

## 2016-10-07 NOTE — Progress Notes (Signed)
Referring Provider: Sydnee Levans* Primary Care Physician:  Verner Mould, MD Primary GI: Dr. Gala Romney   Chief Complaint  Patient presents with  . Abdominal Pain    HPI:   [name] is a 56 y.o. female presenting today with a history of chronic constipation, GERD, dysphagia. Multiple dilations in past, most recently in July 2017 of mild Schatzki's ring.History of chronically elevated alk phos with serologic markers negative for PBC. Hep BsAg and Hep C antibody negative. GGT normal. Normal transaminases. Last US abdomen Feb 2016 with mild fatty liver. Following LFTs yearly.   Dysphagia: has been evaluated extensively. Seen by ENT and Speech Pathologist.  She underwent a laryngoscopy and noted moderate laryngeal edema. She also had endoscopic cauterization of left anterior and superior nasal septum due to chronic nose bleeds. I discussed patient's clinical scenario with Genene Churn, Speech Pathologist as well.  She does have a prominent cricopharyngeal muscle and POSSIBLY the start of a very small Zenker's diverticulum on frame 3 of MBSS. Update BPE Nov 2017 was normal and recommended to see [facility] for possible manometry, which she declined.   Last Alk Phos in June 154, same as prior evaluation. Notes abdominal discomfort diffusely, cramping upper abdomen. Has alternating loose stool for 2 months. Everything she eats goes straight through her. Doesn't want to eat because she has the diarrhea. Some days will eat and not have diarrhea, then next thing she knows, it is back. Alternating constipation and diarrhea. No chills. Hasn't checked for fever. Was on antibiotics (Z pack a few months ago) for UTI. Will go 2-3 days without BM. Taking Amitiza BID.   Still getting strangled. Discussed further evaluation but would like to wait until diarrhea addressed.     Past Medical History:  Diagnosis Date  . Anxiety   . Asthma    every now and then.  Wheezing and coughing  .  Cancer ([place]) 1997   left lung  . Chronic abdominal pain   . Chronic back pain   . Chronic joint pain   . Depression   . GERD (gastroesophageal reflux disease)   . HLD (hyperlipidemia)   . HTN (hypertension)   . Nausea and vomiting    chronic, recurrent  . Osteoarthrosis involving more than one site but not generalized   . Palpitations 01/2013   chronic, intermittent  . Shortness of breath dyspnea    with exertion    Past Surgical History:  Procedure Laterality Date  . ABDOMINAL HYSTERECTOMY    . ABDOMINAL SURGERY     ovarian cyst removal  . BIOPSY N/A 11/22/2013   Procedure: GASTRIC BIOPSY;  Surgeon: Daneil Dolin, MD;  [facility];  Service: Endoscopy;  Laterality: N/A;  . BLADDER SURGERY  suspension x4   x 4  . CARPAL TUNNEL RELEASE Right 07/11/2015   Procedure: RIGHT CARPAL TUNNEL RELEASE;  Surgeon: Jessy Oto, MD;  Location: [facility];  Service: Orthopedics;  Laterality: Right;  . CHOLECYSTECTOMY N/A 04/05/2014   Procedure: LAPAROSCOPIC CHOLECYSTECTOMY;  Surgeon: Aviva Signs Md, MD;  Location: [facility];  Service: General;  Laterality: N/A;  . COCCYGECTOMY N/A 04/11/2015   Procedure: COCCYGECTOMY WITH OSTEOTOMY THROUGH AREA OF DEFORMITY;  Surgeon: Jessy Oto, MD;  Location: [facility];  Service: Orthopedics;  Laterality: N/A;  . COLONOSCOPY    . COLONOSCOPY WITH PROPOFOL N/A 11/22/2013   Dr. Gala Romney: Grade 3 and 4/internal hemorrhoids?"likely source of hematochezia Normal colonoscopy otherwise.   . ESOPHAGOGASTRODUODENOSCOPY (  EGD) WITH PROPOFOL N/A 11/22/2013   Dr. Gala Romney: Incomplete Schatzki's ring dilated and disrupted as described above.  Small hiatal hernia. Focally abnormal gastric mucosa of uncertain significance status post biopsy, reactive gastropathy, negative H.pylori  . ESOPHAGOGASTRODUODENOSCOPY (EGD) WITH PROPOFOL N/A 08/04/2015   Dr. Gala Romney: mild Schatzki's ring s/p dilation, small hiatal hernia   . EXCISION VAGINAL CYST Left 06/20/2012   Procedure:  EXCISION LEFT LABIAL CYST;  Surgeon: Jonnie Kind, MD;  Location: [facility];  Service: Gynecology;  Laterality: Left;  . LOBECTOMY Left   . LUNG CANCER SURGERY  1997   age 77, resection only  . MALONEY DILATION N/A 11/22/2013   Procedure: [name] DILATION;  Surgeon: Daneil Dolin, MD;  [facility];  Service: Endoscopy;  Laterality: N/A;  56  . MALONEY DILATION N/A 08/04/2015   Procedure: [name] DILATION;  Surgeon: Daneil Dolin, MD;  [facility];  Service: Endoscopy;  Laterality: N/A;  . MASS EXCISION N/A 07/23/2014   Procedure: EXCISIONAL BIOPSY NODULE LEFT PARACOCCYGEAL AREA;  Surgeon: Jessy Oto, MD;  Location: [facility];  Service: Orthopedics;  Laterality: N/A;  . MASS EXCISION Left 10/10/2015   Procedure: EXCISIONAL BIOPSY OF LEFT DORSORADIAL FOREARM MASS LIPOMA;  Surgeon: Jessy Oto, MD;  Location: [place];  Service: Orthopedics;  Laterality: Left;  . TUBAL LIGATION      Current Outpatient Prescriptions  Medication Sig Dispense Refill  . acetaminophen-codeine (TYLENOL #4) 300-60 MG tablet Take 1 tablet by mouth every 6 (six) hours as needed for moderate pain. 30 tablet 0  . acyclovir (ZOVIRAX) 400 MG tablet TAKE ONE TABLET BY MOUTH TWICE DAILY 60 tablet 5  . albuterol (PROAIR HFA) 108 (90 BASE) MCG/ACT inhaler Inhale 2 puffs into the lungs every 4 (four) hours as needed. for shortness of breath 1 each 2  . AMITIZA 24 MCG capsule TAKE ONE CAPSULE BY MOUTH TWICE DAILY WITH MEALS 60 capsule 11  . amitriptyline (ELAVIL) 150 MG tablet TAKE ONE TABLET BY MOUTH ONCE DAILY WITH BREAKFAST 30 tablet 5  . atorvastatin (LIPITOR) 40 MG tablet TAKE ONE TABLET BY MOUTH ONCE DAILY 30 tablet 5  . Calcium Citrate-Vitamin D 250-200 MG-UNIT TABS Take 1 tablet by mouth daily.     Marland Kitchen DEXILANT 60 MG capsule TAKE ONE CAPSULE BY MOUTH ONCE DAILY 90 capsule 3  . docusate sodium (COLACE) 100 MG capsule Take 2 capsules (200 mg total) by mouth 2 (two) times daily. 100 capsule 0    . DULoxetine (CYMBALTA) 60 MG capsule Take 1 capsule (60 mg total) by mouth 2 (two) times daily. 120 capsule 11  . gabapentin (NEURONTIN) 300 MG capsule TAKE 1 CAPSULE BY MOUTH THREE TIMES DAILY 90 capsule 2  . hydrochlorothiazide (HYDRODIURIL) 25 MG tablet Take 1 tablet (25 mg total) by mouth daily. 90 tablet 2  . meloxicam (MOBIC) 15 MG tablet Take 1 tablet (15 mg total) by mouth daily. 30 tablet 2  . phenazopyridine (PYRIDIUM) 100 MG tablet Take 1 tablet (100 mg total) by mouth 3 (three) times daily as needed for pain. 10 tablet 0  . sulfamethoxazole-trimethoprim (BACTRIM DS,SEPTRA DS) 800-160 MG tablet Take 1 tablet by mouth 2 (two) times daily. 6 tablet 0  . methylPREDNISolone (MEDROL DOSEPAK) 4 MG TBPK tablet Take as directed. (Patient not taking: Reported on 10/07/2016) 21 tablet 0   No current facility-administered medications for this visit.     Allergies as of 10/07/2016 - Review Complete 10/07/2016  Allergen Reaction Noted  .  Promethazine hcl Nausea And Vomiting and Other (See Comments) 07/26/2008    Family History  Problem Relation Age of Onset  . Diabetes Other   . Heart disease Other        has Psychologist, forensic  . Hypertension Mother   . Kidney disease Mother   . Hypertension Father   . Kidney disease Father   . Heart disease Father   . Cancer Father        leukemia  . Hypertension Sister   . Hypertension Sister   . Colon cancer Neg Hx     Social History   Social History  . Marital status: Married    Spouse name: N/A  . Number of children: 3  . Years of education: N/A   Social History Main Topics  . Smoking status: Former Smoker    Packs/day: 1.50    Years: 25.00    Types: Cigarettes    Quit date: 04/07/1995  . Smokeless tobacco: Never Used  . Alcohol use No  . Drug use: No  . Sexual activity: Not Currently    Birth control/ protection: Surgical   Other Topics Concern  . None   Social History Narrative  . None    Review of Systems: Gen: see HPI   CV: Denies chest pain, palpitations, syncope, peripheral edema, and claudication. Resp: Denies dyspnea at rest, cough, wheezing, coughing up blood, and pleurisy. GI: see HPI  Derm: Denies rash, itching, dry skin Psych: Denies depression, anxiety, memory loss, confusion. No homicidal or suicidal ideation.  Heme: Denies bruising, bleeding, and enlarged lymph nodes.  Physical Exam: BP 122/66   Pulse 75   Temp 97.9 F (36.6 C) (Oral)   Ht '5\' 5"'  (1.651 m)   Wt 175 lb 3.2 oz (79.5 kg)   BMI 29.15 kg/m  General:   Alert and oriented. No distress noted. Pleasant and cooperative.  Head:  Normocephalic and atraumatic. Eyes:  Conjuctiva clear without scleral icterus. Mouth:  Oral mucosa pink and moist.  Abdomen:  +BS, soft, non-tender and non-distended. No rebound or guarding. No HSM or masses noted. Msk:  Symmetrical without gross deformities. Normal posture. Extremities:  Without edema. Neurologic:  Alert and  oriented x4 Psych:  Alert and cooperative. Normal mood and affect.

## 2016-10-07 NOTE — Patient Instructions (Signed)
Stop Amitiza. Call me if you STILL have diarrhea even after stopping the Amitiza. If you do, then complete the stool studies.   Since you are stopping the Amitiza so we can get a good idea of what's going on, you may end up being constipated. If that happens, take Miralax once each evening up to twice a day. If your diarrhea gets better off Amitiza and you are constipated, we will do a different plan for constipation (such as lower dose of Amitiza or a different medication).   Call me next week with an update on how you are doing. By that point, you will have been off the Amitiza for a week, and we will have a better idea if that is what caused the abdominal cramping and diarrhea or not.   Return in 4-6 weeks.

## 2016-10-11 DIAGNOSIS — R197 Diarrhea, unspecified: Secondary | ICD-10-CM | POA: Insufficient documentation

## 2016-10-11 NOTE — Assessment & Plan Note (Signed)
Diarrhea for the past 2 months, continues to take Amitiza BID. At this point, it is unclear if this is a medication effect or truly a significant change in bowel habits. I do note that she was on antibiotics a few months ago, so infectious process is not entirely ruled out but does not seem consistent with such. I have asked her to stop Amitiza for now and call me with an update. IF persistent loose stool, she is to obtain stool studies. These were given to her today. Return for close follow-up in 4-6 weeks.

## 2016-10-11 NOTE — Assessment & Plan Note (Signed)
Chronic dysphagia with dilation in July 2017. She has never fully improved despite ENT evaluation and speech. BPE Nov 2017 normal. I would suggest manometry as the next step. She would like to hold on this for now due to lower GI issues. Continue Dexilant once daily.

## 2016-10-12 NOTE — Progress Notes (Signed)
CC'ED TO PCP 

## 2016-10-20 ENCOUNTER — Encounter (HOSPITAL_COMMUNITY): Payer: Self-pay

## 2016-10-20 ENCOUNTER — Emergency Department (HOSPITAL_COMMUNITY): Payer: Medicare Other

## 2016-10-20 ENCOUNTER — Emergency Department (HOSPITAL_COMMUNITY)
Admission: EM | Admit: 2016-10-20 | Discharge: 2016-10-20 | Disposition: A | Discharge status: Home / Self Care | Payer: Medicare Other | Attending: Emergency Medicine | Admitting: Emergency Medicine

## 2016-10-20 DIAGNOSIS — Z79899 Other long term (current) drug therapy: Secondary | ICD-10-CM | POA: Insufficient documentation

## 2016-10-20 DIAGNOSIS — J4541 Moderate persistent asthma with (acute) exacerbation: Secondary | ICD-10-CM | POA: Diagnosis not present

## 2016-10-20 DIAGNOSIS — F17210 Nicotine dependence, cigarettes, uncomplicated: Secondary | ICD-10-CM | POA: Diagnosis not present

## 2016-10-20 DIAGNOSIS — E876 Hypokalemia: Secondary | ICD-10-CM | POA: Diagnosis not present

## 2016-10-20 DIAGNOSIS — I10 Essential (primary) hypertension: Secondary | ICD-10-CM | POA: Diagnosis not present

## 2016-10-20 DIAGNOSIS — J45901 Unspecified asthma with (acute) exacerbation: Secondary | ICD-10-CM | POA: Diagnosis not present

## 2016-10-20 DIAGNOSIS — Z87891 Personal history of nicotine dependence: Secondary | ICD-10-CM | POA: Diagnosis not present

## 2016-10-20 DIAGNOSIS — J45909 Unspecified asthma, uncomplicated: Secondary | ICD-10-CM | POA: Diagnosis not present

## 2016-10-20 DIAGNOSIS — R0602 Shortness of breath: Secondary | ICD-10-CM | POA: Diagnosis not present

## 2016-10-20 LAB — BASIC METABOLIC PANEL
Anion gap: 9 (ref 5–15)
CALCIUM: 8.7 mg/dL — AB (ref 8.9–10.3)
CO2: 30 mmol/L (ref 22–32)
Chloride: 98 mmol/L — ABNORMAL LOW (ref 101–111)
Creatinine, Ser: 0.89 mg/dL (ref 0.44–1.00)
GFR calc Af Amer: >60 mL/min (ref >60)
GLUCOSE: 101 mg/dL — AB (ref 65–99)
Potassium: 2.6 mmol/L — CL (ref 3.5–5.1)
Sodium: 137 mmol/L (ref 135–145)

## 2016-10-20 LAB — CBC WITH DIFFERENTIAL/PLATELET
Basophils Absolute: 0 10*3/uL (ref 0.0–0.1)
Basophils Relative: 1 %
EOS ABS: 0.1 10*3/uL (ref 0.0–0.7)
EOS PCT: 3 %
HCT: 38 % (ref 36.0–46.0)
Hemoglobin: 12.7 g/dL (ref 12.0–15.0)
LYMPHS ABS: 2 10*3/uL (ref 0.7–4.0)
Lymphocytes Relative: 47 %
MCH: 31.1 pg (ref 26.0–34.0)
MCHC: 33.4 g/dL (ref 30.0–36.0)
MCV: 93.1 fL (ref 78.0–100.0)
Monocytes Absolute: 0.3 10*3/uL (ref 0.1–1.0)
Monocytes Relative: 7 %
Neutro Abs: 1.9 10*3/uL (ref 1.7–7.7)
Neutrophils Relative %: 44 %
PLATELETS: 251 10*3/uL (ref 150–400)
RBC: 4.08 MIL/uL (ref 3.87–5.11)
RDW: 12.6 % (ref 11.5–15.5)
WBC: 4.3 10*3/uL (ref 4.0–10.5)

## 2016-10-20 LAB — TROPONIN I: Troponin I: <0.03 ng/mL (ref <0.03)

## 2016-10-20 MED ORDER — POTASSIUM CHLORIDE CRYS ER 20 MEQ PO TBCR: 20.0000 meq | EXTENDED_RELEASE_TABLET | Freq: Two times a day (BID) | ORAL | 0 refills | Status: DC

## 2016-10-20 MED ORDER — PREDNISONE 10 MG PO TABS
10.0000 mg | ORAL_TABLET | Freq: Every day | ORAL | 0 refills | Status: DC
Start: 2016-10-20 — End: 2017-01-20

## 2016-10-20 MED ORDER — ALBUTEROL SULFATE (5 MG/ML) 0.5% IN NEBU: 5.0000 mg | INHALATION_SOLUTION | Freq: Four times a day (QID) | RESPIRATORY_TRACT | 3 refills | Status: DC | PRN

## 2016-10-20 MED ORDER — METHYLPREDNISOLONE SODIUM SUCC 125 MG IJ SOLR
125.0000 mg | Freq: Once | INTRAMUSCULAR | Status: AC
  Administered 2016-10-20: 125 mg via INTRAVENOUS
  Filled 2016-10-20: qty 2

## 2016-10-20 MED ORDER — ALBUTEROL SULFATE HFA 108 (90 BASE) MCG/ACT IN AERS: 1.0000 | INHALATION_SPRAY | Freq: Four times a day (QID) | RESPIRATORY_TRACT | 3 refills | Status: DC | PRN

## 2016-10-20 MED ORDER — POTASSIUM CHLORIDE CRYS ER 20 MEQ PO TBCR
40.0000 meq | EXTENDED_RELEASE_TABLET | Freq: Once | ORAL | Status: AC
  Administered 2016-10-20: 40 meq via ORAL
  Filled 2016-10-20: qty 2

## 2016-10-20 MED ORDER — IPRATROPIUM-ALBUTEROL 0.5-2.5 (3) MG/3ML IN SOLN
3.0000 mL | Freq: Once | RESPIRATORY_TRACT | Status: AC
  Administered 2016-10-20: 3 mL via RESPIRATORY_TRACT
  Filled 2016-10-20: qty 3

## 2016-10-20 NOTE — ED Notes (Signed)
Pt ambulated to [facility]

## 2016-10-20 NOTE — ED Triage Notes (Signed)
Pt reports sob x 2 weeks and chest tightness for the past few days.  Has been using albuterol inhaler without relief.

## 2016-10-20 NOTE — Discharge Instructions (Signed)
Prescription for prednisone, potassium, inhaler, nebulizer solution and machine. Follow-up your primary care doctor or return if worse.

## 2016-10-20 NOTE — ED Notes (Signed)
Dr Lacinda Axon notified of K result of 2.6

## 2016-10-20 NOTE — ED Provider Notes (Signed)
[facility] DEPT Provider Note   CSN: [id number] Arrival date & time: 10/20/16  0814     History   Chief Complaint Chief Complaint  Patient presents with  . Shortness of Breath    HPI [name] is a 56 y.o. female.  Wheezing, dyspnea, chest tightness for 2 weeks, worsening past 2 days. She has been using her inhaler with minimal relief. This is a long-standing problem. No known history of coronary artery disease. She does have hypertension and hyperlipidemia.  Symptoms are not associated with any activity. Nothing makes symptoms better or worse.      Past Medical History:  Diagnosis Date  . Anxiety   . Asthma    every now and then.  Wheezing and coughing  . Cancer ([place]) 1997   left lung  . Chronic abdominal pain   . Chronic back pain   . Chronic joint pain   . Depression   . GERD (gastroesophageal reflux disease)   . HLD (hyperlipidemia)   . HTN (hypertension)   . Nausea and vomiting    chronic, recurrent  . Osteoarthrosis involving more than one site but not generalized   . Palpitations 01/2013   chronic, intermittent  . Shortness of breath dyspnea    with exertion    Patient Active Problem List   Diagnosis Date Noted  . Diarrhea 10/11/2016  . Right hip pain 06/07/2016  . Dysuria 04/14/2016  . Malaise and fatigue 04/14/2016  . Breast pain, left 11/06/2015  . Mass of left forearm 10/10/2015    Class: Chronic  . Dysphagia, oropharyngeal   . Gastroesophageal reflux disease without esophagitis   . Carpal tunnel syndrome, right 07/11/2015    Class: Chronic  . Prediabetes 07/09/2015  . Lumbar stenosis with neurogenic claudication 01/05/2015  . Spondylolisthesis of lumbar region 01/03/2015    Class: Chronic  . Spinal stenosis, lumbar region, with neurogenic claudication 01/03/2015    Class: Chronic  . Soft tissue mass 07/23/2014    Class: Acute  . Coccygodynia 07/23/2014    Class: Acute  . Healthcare maintenance 03/19/2014  . Dysphagia,  pharyngoesophageal phase   . Obesity, unspecified 02/15/2013  . Genital herpes 10/12/2012  . HSV (herpes simplex virus) anogenital infection 09/29/2012  . UNSPECIFIED SLEEP APNEA 12/04/2008  . ADENOCARCINOMA, LUNG 10/09/2008  . Osteoarthritis of multiple joints 07/13/2006  . HYPERCHOLESTEROLEMIA 03/17/2006  . DEPRESSION, MAJOR, RECURRENT 03/17/2006  . HYPERTENSION, BENIGN SYSTEMIC 03/17/2006  . GASTROESOPHAGEAL REFLUX, NO ESOPHAGITIS 03/17/2006  . INSOMNIA NOS 03/17/2006    Past Surgical History:  Procedure Laterality Date  . ABDOMINAL HYSTERECTOMY    . ABDOMINAL SURGERY     ovarian cyst removal  . BIOPSY N/A 11/22/2013   Procedure: GASTRIC BIOPSY;  Surgeon: Daneil Dolin, MD;  Location: [facility];  Service: Endoscopy;  Laterality: N/A;  . BLADDER SURGERY  suspension x4   x 4  . CARPAL TUNNEL RELEASE Right 07/11/2015   Procedure: RIGHT CARPAL TUNNEL RELEASE;  Surgeon: Jessy Oto, MD;  Location: [facility];  Service: Orthopedics;  Laterality: Right;  . CHOLECYSTECTOMY N/A 04/05/2014   Procedure: LAPAROSCOPIC CHOLECYSTECTOMY;  Surgeon: Aviva Signs Md, MD;  Location: [facility];  Service: General;  Laterality: N/A;  . COCCYGECTOMY N/A 04/11/2015   Procedure: COCCYGECTOMY WITH OSTEOTOMY THROUGH AREA OF DEFORMITY;  Surgeon: Jessy Oto, MD;  Location: [facility];  Service: Orthopedics;  Laterality: N/A;  . COLONOSCOPY    . COLONOSCOPY WITH PROPOFOL N/A 11/22/2013   Dr. Gala Romney: Grade 3  and 4/internal hemorrhoids?"likely source of hematochezia Normal colonoscopy otherwise.   . ESOPHAGOGASTRODUODENOSCOPY (EGD) WITH PROPOFOL N/A 11/22/2013   Dr. Gala Romney: Incomplete Schatzki's ring dilated and disrupted as described above.  Small hiatal hernia. Focally abnormal gastric mucosa of uncertain significance status post biopsy, reactive gastropathy, negative H.pylori  . ESOPHAGOGASTRODUODENOSCOPY (EGD) WITH PROPOFOL N/A 08/04/2015   Dr. Gala Romney: mild Schatzki's ring s/p dilation, small hiatal hernia    . EXCISION VAGINAL CYST Left 06/20/2012   Procedure: EXCISION LEFT LABIAL CYST;  Surgeon: Jonnie Kind, MD;  Location: [facility];  Service: Gynecology;  Laterality: Left;  . LOBECTOMY Left   . LUNG CANCER SURGERY  1997   age 84, resection only  . MALONEY DILATION N/A 11/22/2013   Procedure: [name] DILATION;  Surgeon: Daneil Dolin, MD;  Location: [facility];  Service: Endoscopy;  Laterality: N/A;  56  . MALONEY DILATION N/A 08/04/2015   Procedure: [name] DILATION;  Surgeon: Daneil Dolin, MD;  [facility];  Service: Endoscopy;  Laterality: N/A;  . MASS EXCISION N/A 07/23/2014   Procedure: EXCISIONAL BIOPSY NODULE LEFT PARACOCCYGEAL AREA;  Surgeon: Jessy Oto, MD;  Location: [facility];  Service: Orthopedics;  Laterality: N/A;  . MASS EXCISION Left 10/10/2015   Procedure: EXCISIONAL BIOPSY OF LEFT DORSORADIAL FOREARM MASS LIPOMA;  Surgeon: Jessy Oto, MD;  Location: [facility];  Service: Orthopedics;  Laterality: Left;  . TUBAL LIGATION      OB History    Gravida Para Term Preterm AB Living   3 3 3     3    SAB TAB Ectopic Multiple Live Births                   Home Medications    Prior to Admission medications   Medication Sig Start Date End Date Taking? Authorizing Provider  acetaminophen-codeine (TYLENOL #4) 300-60 MG tablet Take 1 tablet by mouth every 6 (six) hours as needed for moderate pain. 08/27/16  Yes Jessy Oto, MD  acyclovir (ZOVIRAX) 400 MG tablet TAKE ONE TABLET BY MOUTH TWICE DAILY 04/19/16  Yes Verner Mould, MD  amitriptyline (ELAVIL) 150 MG tablet TAKE ONE TABLET BY MOUTH ONCE DAILY WITH BREAKFAST 04/19/16  Yes Verner Mould, MD  atorvastatin (LIPITOR) 40 MG tablet TAKE ONE TABLET BY MOUTH ONCE DAILY 08/23/16  Yes Bufford Lope, DO  DEXILANT 60 MG capsule TAKE ONE CAPSULE BY MOUTH ONCE DAILY 09/22/16  Yes Mahala Menghini, PA-C  DULoxetine (CYMBALTA) 60 MG capsule Take 1 capsule (60 mg total) by mouth 2 (two) times daily.  02/26/16  Yes Verner Mould, MD  gabapentin (NEURONTIN) 300 MG capsule TAKE 1 CAPSULE BY MOUTH THREE TIMES DAILY 07/19/16  Yes Verner Mould, MD  hydrochlorothiazide (HYDRODIURIL) 25 MG tablet Take 1 tablet (25 mg total) by mouth daily. 03/05/16  Yes Verner Mould, MD  methocarbamol (ROBAXIN) 500 MG tablet Take 500 mg by mouth every 8 (eight) hours as needed for muscle spasms.   Yes [provider]  albuterol (PROVENTIL HFA;VENTOLIN HFA) 108 (90 Base) MCG/ACT inhaler Inhale 1-2 puffs into the lungs every 6 (six) hours as needed for wheezing or shortness of breath. 10/20/16   Nat Christen, MD  albuterol (PROVENTIL) (5 MG/ML) 0.5% nebulizer solution Take 1 mL (5 mg total) by nebulization every 6 (six) hours as needed for wheezing or shortness of breath. 10/20/16   Nat Christen, MD  AMITIZA 24 MCG capsule TAKE ONE CAPSULE BY MOUTH  TWICE DAILY WITH MEALS Patient not taking: Reported on 10/20/2016 12/23/15   Mahala Menghini, PA-C  docusate sodium (COLACE) 100 MG capsule Take 2 capsules (200 mg total) by mouth 2 (two) times daily. Patient not taking: Reported on 10/20/2016 04/11/15   Jessy Oto, MD  meloxicam (MOBIC) 15 MG tablet Take 1 tablet (15 mg total) by mouth daily. Patient not taking: Reported on 10/20/2016 06/07/16   Verner Mould, MD  methylPREDNISolone (MEDROL DOSEPAK) 4 MG TBPK tablet Take as directed. Patient not taking: Reported on 10/07/2016 09/10/16   Jessy Oto, MD  phenazopyridine (PYRIDIUM) 100 MG tablet Take 1 tablet (100 mg total) by mouth 3 (three) times daily as needed for pain. Patient not taking: Reported on 10/20/2016 04/14/16   Verner Mould, MD  potassium chloride SA (K-DUR,KLOR-CON) 20 MEQ tablet Take 1 tablet (20 mEq total) by mouth 2 (two) times daily. 10/20/16   Nat Christen, MD  predniSONE (DELTASONE) 10 MG tablet Take 1 tablet (10 mg total) by mouth daily with breakfast. 2 tablets for 4 days; one tablet for 4 days  10/20/16   Nat Christen, MD  sulfamethoxazole-trimethoprim (BACTRIM DS,SEPTRA DS) 800-160 MG tablet Take 1 tablet by mouth 2 (two) times daily. Patient not taking: Reported on 10/20/2016 04/14/16   Verner Mould, MD    Family History Family History  Problem Relation Age of Onset  . Diabetes Other   . Heart disease Other        has Psychologist, forensic  . Hypertension Mother   . Kidney disease Mother   . Hypertension Father   . Kidney disease Father   . Heart disease Father   . Cancer Father        leukemia  . Hypertension Sister   . Hypertension Sister   . Colon cancer Neg Hx     Social History Social History  Substance Use Topics  . Smoking status: Former Smoker    Packs/day: 1.50    Years: 25.00    Types: Cigarettes    Quit date: 04/07/1995  . Smokeless tobacco: Never Used  . Alcohol use No     Allergies   Promethazine hcl   Review of Systems Review of Systems  All other systems reviewed and are negative.    Physical Exam Updated Vital Signs BP (!) 160/88 (BP Location: Right Arm)   Pulse 80   Temp 98 F (36.7 C) (Oral)   Resp 17   Ht 5\' 5"  (1.651 m)   Wt 79.4 kg (175 lb)   SpO2 99%   BMI 29.12 kg/m   Physical Exam  Constitutional: She is oriented to person, place, and time. She appears well-developed and well-nourished.  HENT:  Head: Normocephalic and atraumatic.  Eyes: Conjunctivae are normal.  Neck: Neck supple.  Cardiovascular: Normal rate and regular rhythm.   Pulmonary/Chest: Effort normal.  Bilateral expiratory wheeze.  Abdominal: Soft. Bowel sounds are normal.  Musculoskeletal: Normal range of motion.  Neurological: She is alert and oriented to person, place, and time.  Skin: Skin is warm and dry.  Psychiatric: She has a normal mood and affect. Her behavior is normal.  Nursing note and vitals reviewed.    ED Treatments / Results  Labs (all labs ordered are listed, but only abnormal results are displayed) Labs Reviewed  BASIC  METABOLIC PANEL - Abnormal; Notable for the following:       Result Value   Potassium 2.6 (*)    Chloride 98 (*)  Glucose, Bld 101 (*)    BUN <5 (*)    Calcium 8.7 (*)    All other components within normal limits  CBC WITH DIFFERENTIAL/PLATELET  TROPONIN I    EKG  EKG Interpretation  Date/Time:  Wednesday October 20 2016 08:48:42 EDT Ventricular Rate:  75 PR Interval:    QRS Duration: 78 QT Interval:  345 QTC Calculation: 386 R Axis:   -11 Text Interpretation:  Sinus rhythm Borderline repolarization abnormality Confirmed by Nat Christen [phone]) on 10/20/2016 8:56:59 AM       Radiology Dg Chest 2 View  Result Date: 10/20/2016 CLINICAL DATA:  Shortness of breath and chest tightness for the past 2 days unrelieved by albuterol inhaler. History of asthma, lung malignancy with left lobectomy, former smoker. EXAM: CHEST  2 VIEW COMPARISON:  PA and lateral chest x-ray of April 15, 2016 FINDINGS: The lungs are mildly hyperinflated with hemidiaphragm flattening. There are stable postsurgical changes in the left mid and lower lung. There is no alveolar infiltrate or pleural effusion. The interstitial markings are mildly prominent though stable. The heart and pulmonary vascularity are normal. The mediastinum is normal in width. The bony thorax exhibits no acute abnormality. IMPRESSION: Chronic bronchitic-reactive airway changes. No acute pneumonia nor CHF. Postsurgical changes in the left lung, stable. Electronically Signed   By: David  Martinique M.D.   On: 10/20/2016 08:59    Procedures Procedures (including critical care time)  Medications Ordered in ED Medications  ipratropium-albuterol (DUONEB) 0.5-2.5 (3) MG/3ML nebulizer solution 3 mL (3 mLs Nebulization Given 10/20/16 0916)  methylPREDNISolone sodium succinate (SOLU-MEDROL) 125 mg/2 mL injection 125 mg (125 mg Intravenous Given 10/20/16 0928)  potassium chloride SA (K-DUR,KLOR-CON) CR tablet 40 mEq (40 mEq Oral Given 10/20/16 1050)      Initial Impression / Assessment and Plan / ED Course  I have reviewed the triage vital signs and the nursing notes.  Pertinent labs & imaging results that were available during my care of the patient were reviewed by me and considered in my medical decision making (see chart for details).     Patient is wheezing on physical exam. EKG, chest x-ray, troponin all negative. Potassium low. She has a known history of asthma. She will need primary care follow-up to assess for potential cardiac issues.  Discharge medication albuterol inhaler, albuterol nebulizer, potassium, prednisone.  Final Clinical Impressions(s) / ED Diagnoses   Final diagnoses:  Moderate asthma with exacerbation, unspecified whether persistent  Hypokalemia    New Prescriptions Discharge Medication List as of 10/20/2016 12:13 PM    START taking these medications   Details  albuterol (PROVENTIL) (5 MG/ML) 0.5% nebulizer solution Take 1 mL (5 mg total) by nebulization every 6 (six) hours as needed for wheezing or shortness of breath., Starting Wed 10/20/2016, Print    potassium chloride SA (K-DUR,KLOR-CON) 20 MEQ tablet Take 1 tablet (20 mEq total) by mouth 2 (two) times daily., Starting Wed 10/20/2016, Print    predniSONE (DELTASONE) 10 MG tablet Take 1 tablet (10 mg total) by mouth daily with breakfast. 2 tablets for 4 days; one tablet for 4 days, Starting Wed 10/20/2016, Print         Nat Christen, MD 10/20/16 1240

## 2016-10-21 ENCOUNTER — Telehealth: Payer: Self-pay

## 2016-10-21 NOTE — Telephone Encounter (Signed)
Patient had to go to [facility] ED for breathing issues. She was prescribed a nebulizer and the meds for it.  Patient wants Avon Gully to know that she can not afford it and wants to know if Avon Gully can suggest something else.Patient did not want to make an appointment which was offered to her several times.Ozella Almond

## 2016-10-22 ENCOUNTER — Other Ambulatory Visit: Payer: Self-pay | Admitting: Internal Medicine

## 2016-10-22 DIAGNOSIS — A609 Anogenital herpesviral infection, unspecified: Secondary | ICD-10-CM

## 2016-10-27 NOTE — Telephone Encounter (Signed)
Patient informed of message from MD and appointment scheduled for 10/18 with PCP.

## 2016-11-02 ENCOUNTER — Other Ambulatory Visit: Payer: Self-pay | Admitting: *Deleted

## 2016-11-02 MED ORDER — ALBUTEROL SULFATE HFA 108 (90 BASE) MCG/ACT IN AERS
2.0000 | INHALATION_SPRAY | Freq: Four times a day (QID) | RESPIRATORY_TRACT | 2 refills | Status: DC | PRN
Start: 2016-11-02 — End: 2018-04-01

## 2016-11-02 NOTE — Telephone Encounter (Signed)
Pt needs a inhaler $30 is too much. Please advise. Deseree Kennon Holter, CMA

## 2016-11-04 ENCOUNTER — Inpatient Hospital Stay: Payer: Medicare Other | Admitting: Internal Medicine

## 2016-11-16 ENCOUNTER — Other Ambulatory Visit: Payer: Self-pay | Admitting: Family Medicine

## 2016-11-16 DIAGNOSIS — M48062 Spinal stenosis, lumbar region with neurogenic claudication: Secondary | ICD-10-CM

## 2016-11-18 ENCOUNTER — Other Ambulatory Visit: Payer: Self-pay | Admitting: Internal Medicine

## 2016-11-18 ENCOUNTER — Inpatient Hospital Stay: Payer: Medicare Other | Admitting: Internal Medicine

## 2016-11-18 DIAGNOSIS — M48062 Spinal stenosis, lumbar region with neurogenic claudication: Secondary | ICD-10-CM

## 2016-11-26 ENCOUNTER — Ambulatory Visit: Payer: Medicare Other | Admitting: Gastroenterology

## 2016-11-30 ENCOUNTER — Other Ambulatory Visit: Payer: Self-pay | Admitting: Internal Medicine

## 2016-11-30 DIAGNOSIS — M48062 Spinal stenosis, lumbar region with neurogenic claudication: Secondary | ICD-10-CM

## 2016-11-30 DIAGNOSIS — I10 Essential (primary) hypertension: Secondary | ICD-10-CM

## 2017-01-04 ENCOUNTER — Ambulatory Visit: Payer: Medicare Other | Admitting: Internal Medicine

## 2017-01-19 ENCOUNTER — Ambulatory Visit (INDEPENDENT_AMBULATORY_CARE_PROVIDER_SITE_OTHER): Payer: Medicare Other | Admitting: Internal Medicine

## 2017-01-19 ENCOUNTER — Encounter: Payer: Self-pay | Admitting: Internal Medicine

## 2017-01-19 ENCOUNTER — Other Ambulatory Visit: Payer: Self-pay

## 2017-01-19 VITALS — BP 118/60 | HR 89 | Temp 98.1°F | Ht 65.0 in | Wt 181.0 lb

## 2017-01-19 DIAGNOSIS — M79641 Pain in right hand: Secondary | ICD-10-CM

## 2017-01-19 DIAGNOSIS — M25552 Pain in left hip: Secondary | ICD-10-CM | POA: Diagnosis present

## 2017-01-19 MED ORDER — MELOXICAM 15 MG PO TABS: 15.0000 mg | ORAL_TABLET | Freq: Every day | ORAL | 2 refills | Status: DC

## 2017-01-19 NOTE — Assessment & Plan Note (Signed)
Followed by ortho (Dr. Louanne Skye) for this issue, who feels that symptoms most likely 2/2 osteoarthritis. Patient was encouraged to f/u with Dr. Louanne Skye in 09/2016, but has not done so. Encouraged patient to schedule this appointment, and also discussed conservative measures for symptomatic relief. Patient currently taking ibuprofen daily. Will resume meloxicam for better GI protection. Also placed physical therapy referral as patient was never seen by PT in August.

## 2017-01-19 NOTE — Assessment & Plan Note (Signed)
History of carpal tunnel on R. Says she received a "nerve block" on this hand a few years ago by Dr. Louanne Skye. She is planning to follow up with him regarding her pain, just wanted to let me know about it. No abnormalities on physical exam today.

## 2017-01-19 NOTE — Patient Instructions (Addendum)
It was nice seeing you again today [name]!  To help with your pain, please begin taking Mobic (meloxicam) one tablet in the morning daily. Do not take more than one tablet a day. Do not take ibuprofen, naproxen, or medications containing ibuprofen or naproxen while taking Mobic. It is safe to take Tylenol and Mobic at the same time if needed.   I have placed a referral to physical therapy for you. They will call you with the date and time of this appointment.   If you have any questions or concerns, please feel free to call the clinic.   Be well,  Dr. Avon Gully

## 2017-01-19 NOTE — Progress Notes (Signed)
   Subjective:   Patient: [name]       Birthdate: November 29, 1960       MRN: [id number]      HPI  [name] is a 57 y.o. female presenting for L-sided pain as well as R hand pain.   L-sided pain Located primaily in L lumbar region, L buttocks, L leg. Pain is worse at night, to the point where patient says she is in tears at times. Patient was seen by her orthopedist Dr. Louanne Skye in 08/18, who felt that symptoms most likely 2/2 osteoarthritis after reviewing imaging (both plain films and MRI). Recommended a variety of conservative measures to help with pain, and referred patient to physical therapy. She did not go to physical therapy. She has been taking ibuprofen as Dr. Louanne Skye recommended, but has not followed any of his other recommendations. She was supposed to f/u with him in 09/2016, but never scheduled a follow-up appointment.   R hand pain Describes as a throbbing pain. Has also been evaluated by Dr. Louanne Skye for this issue a few years ago, and says he performed a nerve block which significantly improved her pain. She was not having this pain the last time she was seen by ortho, so did not mention it to him. She would like another nerve block.   Smoking status reviewed. Patient is former smoker.   Review of Systems See HPI.     Objective:  Physical Exam  Constitutional: She is oriented to person, place, and time and well-developed, well-nourished, and in no distress.  HENT:  Head: Normocephalic and atraumatic.  Pulmonary/Chest: Effort normal. No respiratory distress.  Musculoskeletal:  Able to walk, sit, and stand unassisted and without difficulty. Full ROM of R hand and fingers. 5/5 strength upper and lower extremities bilaterally.   Neurological: She is alert and oriented to person, place, and time.  Skin: Skin is warm and dry.  Psychiatric: Affect and judgment normal.      Assessment & Plan:  Left hip pain Followed by ortho (Dr. Louanne Skye) for this issue, who feels that symptoms  most likely 2/2 osteoarthritis. Patient was encouraged to f/u with Dr. Louanne Skye in 09/2016, but has not done so. Encouraged patient to schedule this appointment, and also discussed conservative measures for symptomatic relief. Patient currently taking ibuprofen daily. Will resume meloxicam for better GI protection. Also placed physical therapy referral as patient was never seen by PT in August.   Right hand pain History of carpal tunnel on R. Says she received a "nerve block" on this hand a few years ago by Dr. Louanne Skye. She is planning to follow up with him regarding her pain, just wanted to let me know about it. No abnormalities on physical exam today.     Adin Hector, MD, MPH PGY-3 [facility] Pager [phone]

## 2017-01-20 ENCOUNTER — Other Ambulatory Visit: Payer: Self-pay | Admitting: *Deleted

## 2017-01-20 ENCOUNTER — Encounter: Payer: Self-pay | Admitting: *Deleted

## 2017-01-20 ENCOUNTER — Ambulatory Visit (INDEPENDENT_AMBULATORY_CARE_PROVIDER_SITE_OTHER): Payer: Medicare Other | Admitting: Gastroenterology

## 2017-01-20 ENCOUNTER — Encounter: Payer: Self-pay | Admitting: Gastroenterology

## 2017-01-20 ENCOUNTER — Telehealth: Payer: Self-pay | Admitting: *Deleted

## 2017-01-20 VITALS — BP 112/70 | HR 83 | Temp 97.0°F | Ht 64.0 in | Wt 181.2 lb

## 2017-01-20 DIAGNOSIS — R1084 Generalized abdominal pain: Secondary | ICD-10-CM

## 2017-01-20 DIAGNOSIS — R1310 Dysphagia, unspecified: Secondary | ICD-10-CM

## 2017-01-20 DIAGNOSIS — K5900 Constipation, unspecified: Secondary | ICD-10-CM

## 2017-01-20 LAB — CBC WITH DIFFERENTIAL/PLATELET
Basophils Absolute: 39 cells/uL (ref 0–200)
Basophils Relative: 0.8 %
Eosinophils Absolute: 172 cells/uL (ref 15–500)
Eosinophils Relative: 3.5 %
HCT: 36.9 % (ref 35.0–45.0)
Hemoglobin: 12.5 g/dL (ref 11.7–15.5)
Lymphs Abs: 1985 cells/uL (ref 850–3900)
MCH: 30.9 pg (ref 27.0–33.0)
MCHC: 33.9 g/dL (ref 32.0–36.0)
MCV: 91.3 fL (ref 80.0–100.0)
MPV: 9.1 fL (ref 7.5–12.5)
Monocytes Relative: 5.3 %
NEUTROS PCT: 49.9 %
Neutro Abs: 2445 cells/uL (ref 1500–7800)
PLATELETS: 297 10*3/uL (ref 140–400)
RBC: 4.04 10*6/uL (ref 3.80–5.10)
RDW: 12.1 % (ref 11.0–15.0)
TOTAL LYMPHOCYTE: 40.5 %
WBC: 4.9 10*3/uL (ref 3.8–10.8)
WBCMIX: 260 {cells}/uL (ref 200–950)

## 2017-01-20 LAB — COMPLETE METABOLIC PANEL WITH GFR
AG Ratio: 1.5 (calc) (ref 1.0–2.5)
ALBUMIN MSPROF: 4.2 g/dL (ref 3.6–5.1)
ALKALINE PHOSPHATASE (APISO): 149 U/L — AB (ref 33–130)
ALT: 15 U/L (ref 6–29)
AST: 17 U/L (ref 10–35)
BILIRUBIN TOTAL: 0.3 mg/dL (ref 0.2–1.2)
BUN / CREAT RATIO: 6 (calc) (ref 6–22)
BUN: 6 mg/dL — ABNORMAL LOW (ref 7–25)
CHLORIDE: 99 mmol/L (ref 98–110)
CO2: 34 mmol/L — ABNORMAL HIGH (ref 20–32)
CREATININE: 0.95 mg/dL (ref 0.50–1.05)
Calcium: 9.3 mg/dL (ref 8.6–10.4)
GFR, Est African American: 79 mL/min/{1.73_m2} (ref >=60)
GFR, Est Non African American: 68 mL/min/{1.73_m2} (ref >=60)
GLOBULIN: 2.8 g/dL (ref 1.9–3.7)
Glucose, Bld: 105 mg/dL (ref 65–139)
POTASSIUM: 3.7 mmol/L (ref 3.5–5.3)
SODIUM: 139 mmol/L (ref 135–146)
Total Protein: 7 g/dL (ref 6.1–8.1)

## 2017-01-20 NOTE — Assessment & Plan Note (Signed)
Resume Amitiza 24 mcg but at the novel dosing of once daily. BID seemed to cause diarrhea. Linzess ineffective in past per patient. May consider revisiting this.

## 2017-01-20 NOTE — Telephone Encounter (Signed)
Pre-op scheduled for 02/08/17 at 11:00am. Letter mailed to pt. LMOVM making aware of appt detail

## 2017-01-20 NOTE — Assessment & Plan Note (Deleted)
57 year old female with history of chronic dysphagia, multiple dilations, but has noted improvement post-dilation in symptoms. Last EGD/dilation in July 2017. She has also been evaluated by Speech and ENT as noted in HPI. BPE in Nov 2017 normal. As she has noted improvement historically with dilations, we will pursue an EGD as it has been over 18 months.   Proceed with upper endoscopy/dilation in the near future with Dr. Gala Romney. The risks, benefits, and alternatives have been discussed in detail with patient. They have stated understanding and desire to proceed.  PROPOFOL due to polypharmacy Continue Dexilant daily Consider manometry if recurrent or persistent dysphagia despite empiric dilation Return in 8 weeks for office visit

## 2017-01-20 NOTE — Patient Instructions (Signed)
1. For constipation: restart Amitiza once each evening with food. You can increase this to twice a day if needed. We may need to do a lower dosage. Let me know how you do!  2. Please have blood work done today. I have also ordered a CT scan of your abdomen.  3. We have arranged an upper endoscopy with dilation by Dr. Gala Romney. If this is normal or you still have problems, we may need to do further testing.  4. I will see you back in 8 weeks!

## 2017-01-20 NOTE — Progress Notes (Signed)
Referring Provider: Sydnee Levans* Primary Care Physician:  Verner Mould, MD Primary GI: Dr. Gala Romney   Chief Complaint  Patient presents with  . Dysphagia  . Abdominal Pain  . Constipation    HPI:   [name] is a 57 y.o. female presenting today with a history of chronic constipation, GERD, dysphagia. Multiple dilations in past, most recently in July 2017 of mild Schatzki's ring.She has had notable improvement with dilations in the past. History of chronically elevated alk phos with serologic markers negative for PBC. Hep BsAg and Hep C antibody negative. GGT normal. Normal transaminases. Last US abdomen Feb 2016 with mild fatty liver. Following LFTs yearly.   Dysphagia: has been evaluated extensively. Prior evaluation inlcudes ENT and Speech Pathologist.  She underwent a laryngoscopy and noted moderate laryngeal edema. She also had endoscopic cauterization of left anterior and superior nasal septum due to chronic nose bleeds. I discussed patient's clinical scenario with Genene Churn, Speech Pathologist as well.  She does have a prominent cricopharyngeal muscle and POSSIBLY the start of a very small Zenker's diverticulum on frame 3 of MBSS. Update BPE Nov 2017 was normal and recommended for possible manometry, which she declined.   Last seen in Sept 2018 with diarrhea on Amitiza. I asked her to stop this and call with an update. Now back to feeling constipated. Notes lower abdominal pain, sometimes cramping extending up RUQ. Intermittent. May have 1-2 day without pain, otherwise hurts. No relief after BM. Sweating a lot. No weight loss noted. Small amount of vomiting before leaving the house today. Pain present since at least September. Having a BM every other day but has to strain to get it out. Previously on the Amitiza 24 mcg BID. Previously has been on Linzess, which didn't work as well for her several years ago.   Feels like her throat is closing up again.  States all types of textures bother her. Points to her neck and mid chest as location where food "hangs up". Sometimes feels strangled with liquids. Dexilant once daily.   Past Medical History:  Diagnosis Date  . Anxiety   . Asthma    every now and then.  Wheezing and coughing  . Cancer ([place]) 1997   left lung  . Chronic abdominal pain   . Chronic back pain   . Chronic joint pain   . Depression   . GERD (gastroesophageal reflux disease)   . HLD (hyperlipidemia)   . HTN (hypertension)   . Nausea and vomiting    chronic, recurrent  . Osteoarthrosis involving more than one site but not generalized   . Palpitations 01/2013   chronic, intermittent  . Shortness of breath dyspnea    with exertion    Past Surgical History:  Procedure Laterality Date  . ABDOMINAL HYSTERECTOMY    . ABDOMINAL SURGERY     ovarian cyst removal  . BIOPSY N/A 11/22/2013   Procedure: GASTRIC BIOPSY;  Surgeon: Daneil Dolin, MD;  Location: [facility];  Service: Endoscopy;  Laterality: N/A;  . BLADDER SURGERY  suspension x4   x 4  . CARPAL TUNNEL RELEASE Right 07/11/2015   Procedure: RIGHT CARPAL TUNNEL RELEASE;  Surgeon: Jessy Oto, MD;  Location: [place];  Service: Orthopedics;  Laterality: Right;  . CHOLECYSTECTOMY N/A 04/05/2014   Procedure: LAPAROSCOPIC CHOLECYSTECTOMY;  Surgeon: Aviva Signs Md, MD;  Location: [facility];  Service: General;  Laterality: N/A;  . COCCYGECTOMY N/A 04/11/2015   Procedure: COCCYGECTOMY  WITH OSTEOTOMY THROUGH AREA OF DEFORMITY;  Surgeon: Jessy Oto, MD;  Location: [place];  Service: Orthopedics;  Laterality: N/A;  . COLONOSCOPY    . COLONOSCOPY WITH PROPOFOL N/A 11/22/2013   Dr. Gala Romney: Grade 3 and 4/internal hemorrhoids?"likely source of hematochezia Normal colonoscopy otherwise.   . ESOPHAGOGASTRODUODENOSCOPY (EGD) WITH PROPOFOL N/A 11/22/2013   Dr. Gala Romney: Incomplete Schatzki's ring dilated and disrupted as described above.  Small hiatal hernia. Focally abnormal  gastric mucosa of uncertain significance status post biopsy, reactive gastropathy, negative H.pylori  . ESOPHAGOGASTRODUODENOSCOPY (EGD) WITH PROPOFOL N/A 08/04/2015   Dr. Gala Romney: mild Schatzki's ring s/p dilation, small hiatal hernia   . EXCISION VAGINAL CYST Left 06/20/2012   Procedure: EXCISION LEFT LABIAL CYST;  Surgeon: Jonnie Kind, MD;  Location: [facility];  Service: Gynecology;  Laterality: Left;  . LOBECTOMY Left   . LUNG CANCER SURGERY  1997   age 61, resection only  . MALONEY DILATION N/A 11/22/2013   Procedure: [name] DILATION;  Surgeon: Daneil Dolin, MD;  [facility]: [facility];  Service: Endoscopy;  Laterality: N/A;  56  . MALONEY DILATION N/A 08/04/2015   Procedure: [name] DILATION;  Surgeon: Daneil Dolin, MD;  [facility];  Service: Endoscopy;  Laterality: N/A;  . MASS EXCISION N/A 07/23/2014   Procedure: EXCISIONAL BIOPSY NODULE LEFT PARACOCCYGEAL AREA;  Surgeon: Jessy Oto, MD;  Location: [place];  Service: Orthopedics;  Laterality: N/A;  . MASS EXCISION Left 10/10/2015   Procedure: EXCISIONAL BIOPSY OF LEFT DORSORADIAL FOREARM MASS LIPOMA;  Surgeon: Jessy Oto, MD;  Location: [place];  Service: Orthopedics;  Laterality: Left;  . TUBAL LIGATION      Current Outpatient Medications  Medication Sig Dispense Refill  . acetaminophen-codeine (TYLENOL #4) 300-60 MG tablet Take 1 tablet by mouth every 6 (six) hours as needed for moderate pain. 30 tablet 0  . acyclovir (ZOVIRAX) 400 MG tablet TAKE 1 TABLET BY MOUTH TWICE DAILY 60 tablet 5  . albuterol (PROAIR HFA) 108 (90 Base) MCG/ACT inhaler Inhale 2 puffs into the lungs every 6 (six) hours as needed for wheezing or shortness of breath. 1 Inhaler 2  . albuterol (PROVENTIL HFA;VENTOLIN HFA) 108 (90 Base) MCG/ACT inhaler Inhale 1-2 puffs into the lungs every 6 (six) hours as needed for wheezing or shortness of breath. 1 Inhaler 3  . amitriptyline (ELAVIL) 150 MG tablet TAKE 1 TABLET BY MOUTH ONCE DAILY WITH  BREAKFAST 30 tablet 5  . atorvastatin (LIPITOR) 40 MG tablet TAKE ONE TABLET BY MOUTH ONCE DAILY 30 tablet 5  . DEXILANT 60 MG capsule TAKE ONE CAPSULE BY MOUTH ONCE DAILY 90 capsule 3  . DULoxetine (CYMBALTA) 60 MG capsule Take 1 capsule (60 mg total) by mouth 2 (two) times daily. 120 capsule 11  . gabapentin (NEURONTIN) 300 MG capsule TAKE 1 CAPSULE BY MOUTH THREE TIMES DAILY 90 capsule 2  . hydrochlorothiazide (HYDRODIURIL) 25 MG tablet TAKE 1 TABLET BY MOUTH ONCE DAILY 90 tablet 2  . meloxicam (MOBIC) 15 MG tablet Take 1 tablet (15 mg total) by mouth daily. 30 tablet 2  . potassium chloride SA (K-DUR,KLOR-CON) 20 MEQ tablet Take 1 tablet (20 mEq total) by mouth 2 (two) times daily. 20 tablet 0   No current facility-administered medications for this visit.     Allergies as of 01/20/2017 - Review Complete 01/20/2017  Allergen Reaction Noted  . Promethazine hcl Nausea And Vomiting and Other (See Comments) 07/26/2008    Family History  Problem Relation Age of Onset  . Diabetes Other   . Heart disease Other        has Psychologist, forensic  . Hypertension Mother   . Kidney disease Mother   . Hypertension Father   . Kidney disease Father   . Heart disease Father   . Cancer Father        leukemia  . Hypertension Sister   . Hypertension Sister   . Colon cancer Neg Hx     Social History   Socioeconomic History  . Marital status: Married    Spouse name: None  . Number of children: 3  . Years of education: None  . Highest education level: None  Social Needs  . Financial resource strain: None  . Food insecurity - worry: None  . Food insecurity - inability: None  . Transportation needs - medical: None  . Transportation needs - non-medical: None  Occupational History  . None  Tobacco Use  . Smoking status: Former Smoker    Packs/day: 1.50    Years: 25.00    Pack years: 37.50    Types: Cigarettes    Last attempt to quit: 04/07/1995    Years since quitting: 21.8  . Smokeless  tobacco: Never Used  Substance and Sexual Activity  . Alcohol use: No  . Drug use: No  . Sexual activity: Not Currently    Birth control/protection: Surgical  Other Topics Concern  . None  Social History Narrative  . None    Review of Systems: Gen: Denies fever, chills, anorexia. Denies fatigue, weakness, weight loss.  CV: Denies chest pain, palpitations, syncope, peripheral edema, and claudication. Resp: Denies dyspnea at rest, cough, wheezing, coughing up blood, and pleurisy. GI: see HPI  Derm: Denies rash, itching, dry skin Psych: Denies depression, anxiety, memory loss, confusion. No homicidal or suicidal ideation.  Heme: Denies bruising, bleeding, and enlarged lymph nodes.  Physical Exam: BP 112/70   Pulse 83   Temp (!) 97 F (36.1 C) (Oral)   Ht '5\' 4"'  (1.626 m)   Wt 181 lb 3.2 oz (82.2 kg)   BMI 31.10 kg/m  General:   Alert and oriented. No distress noted. Pleasant and cooperative.  Head:  Normocephalic and atraumatic. Eyes:  Conjuctiva clear without scleral icterus. Mouth:  Oral mucosa pink and moist. Poor dentition Lungs: scattered rhonchi  Heart: S1 S2 present, no murmurs  Abdomen:  +BS, soft, TTP lower abdomen, RUQ/epigastric and non-distended. No rebound or guarding. No HSM or masses noted. Msk:  Symmetrical without gross deformities. Normal posture. Extremities:  Without edema. Neurologic:  Alert and  oriented x4 Psych:  Alert and cooperative. Normal mood and affect.

## 2017-01-20 NOTE — Assessment & Plan Note (Signed)
Chronic but more notable over past several months. No alarm features such as rectal bleeding, weight loss. Constipation likely playing a large role. Last imaging several years ago. Will pursue CBC, CMP, CT abd/pelvis.

## 2017-01-20 NOTE — Assessment & Plan Note (Signed)
57 year old female with history of chronic dysphagia, multiple dilations, but has noted improvement post-dilation in symptoms. Last EGD/dilation in July 2017. She has also been evaluated by Speech and ENT as noted in HPI. BPE in Nov 2017 normal. As she has noted improvement historically with dilations, we will pursue an EGD as it has been over 18 months.   Proceed with upper endoscopy/dilation in the near future with Dr. Gala Romney. The risks, benefits, and alternatives have been discussed in detail with patient. They have stated understanding and desire to proceed.  PROPOFOL due to polypharmacy Continue Dexilant daily Consider manometry if recurrent or persistent dysphagia despite empiric dilation Return in 8 weeks for office visit

## 2017-01-20 NOTE — Progress Notes (Signed)
CC'ED TO PCP 

## 2017-01-27 NOTE — Progress Notes (Signed)
CMP unremarkable except for mildly elevated alk phos, which is chronic. We are monitoring this. CBC normal. Awaiting CT scan.

## 2017-01-28 ENCOUNTER — Ambulatory Visit (HOSPITAL_COMMUNITY): Admission: RE | Admit: 2017-01-28 | Payer: Medicare Other | Source: Ambulatory Visit

## 2017-01-28 ENCOUNTER — Telehealth: Payer: Self-pay | Admitting: Internal Medicine

## 2017-01-28 NOTE — Telephone Encounter (Signed)
Pt sister called. Pt is coughing bad, wheezing, vomiting, diiarrhea.  Sister wants to know what she can get OTC to give her for a cold.  Please advise

## 2017-01-28 NOTE — Telephone Encounter (Signed)
Pt sister calling again about what medication her sister can take for her cold. She has a cough and a fever of 101.   Dr Avon Gully is out for the day, but I spoke with dr Ardelia Mems and pt can take tylenol and if she is not feeling better she can go to the urgent care/ED to be checked out.  Pt sister informed. Deseree Blount, CMA  Deseree Blount, CMA

## 2017-02-06 ENCOUNTER — Other Ambulatory Visit: Payer: Self-pay

## 2017-02-06 ENCOUNTER — Emergency Department (HOSPITAL_COMMUNITY): Payer: Medicare Other

## 2017-02-06 ENCOUNTER — Encounter (HOSPITAL_COMMUNITY): Payer: Self-pay

## 2017-02-06 ENCOUNTER — Emergency Department (HOSPITAL_COMMUNITY)
Admission: EM | Admit: 2017-02-06 | Discharge: 2017-02-06 | Disposition: A | Discharge status: Home / Self Care | Payer: Medicare Other | Attending: Emergency Medicine | Admitting: Emergency Medicine

## 2017-02-06 DIAGNOSIS — Z79899 Other long term (current) drug therapy: Secondary | ICD-10-CM | POA: Insufficient documentation

## 2017-02-06 DIAGNOSIS — J45901 Unspecified asthma with (acute) exacerbation: Secondary | ICD-10-CM

## 2017-02-06 DIAGNOSIS — K529 Noninfective gastroenteritis and colitis, unspecified: Secondary | ICD-10-CM | POA: Insufficient documentation

## 2017-02-06 DIAGNOSIS — R05 Cough: Secondary | ICD-10-CM | POA: Insufficient documentation

## 2017-02-06 DIAGNOSIS — R5383 Other fatigue: Secondary | ICD-10-CM | POA: Diagnosis present

## 2017-02-06 DIAGNOSIS — Z85118 Personal history of other malignant neoplasm of bronchus and lung: Secondary | ICD-10-CM | POA: Diagnosis not present

## 2017-02-06 DIAGNOSIS — J4541 Moderate persistent asthma with (acute) exacerbation: Secondary | ICD-10-CM | POA: Diagnosis not present

## 2017-02-06 DIAGNOSIS — Z87891 Personal history of nicotine dependence: Secondary | ICD-10-CM | POA: Diagnosis not present

## 2017-02-06 DIAGNOSIS — I10 Essential (primary) hypertension: Secondary | ICD-10-CM | POA: Diagnosis not present

## 2017-02-06 LAB — URINALYSIS, ROUTINE W REFLEX MICROSCOPIC
BILIRUBIN URINE: NEGATIVE
GLUCOSE, UA: NEGATIVE mg/dL
HGB URINE DIPSTICK: NEGATIVE
KETONES UR: NEGATIVE mg/dL
Leukocytes, UA: NEGATIVE
Nitrite: NEGATIVE
PH: 7 (ref 5.0–8.0)
PROTEIN: NEGATIVE mg/dL
Specific Gravity, Urine: 1.008 (ref 1.005–1.030)

## 2017-02-06 LAB — INFLUENZA PANEL BY PCR (TYPE A & B)
Influenza A By PCR: NEGATIVE
Influenza B By PCR: NEGATIVE

## 2017-02-06 MED ORDER — ALBUTEROL SULFATE (2.5 MG/3ML) 0.083% IN NEBU: 2.5000 mg | INHALATION_SOLUTION | Freq: Once | RESPIRATORY_TRACT | Status: DC

## 2017-02-06 MED ORDER — METHYLPREDNISOLONE SODIUM SUCC 125 MG IJ SOLR
125.0000 mg | Freq: Once | INTRAMUSCULAR | Status: AC
  Administered 2017-02-06: 125 mg via INTRAVENOUS
  Filled 2017-02-06: qty 2

## 2017-02-06 MED ORDER — IPRATROPIUM-ALBUTEROL 0.5-2.5 (3) MG/3ML IN SOLN
3.0000 mL | Freq: Once | RESPIRATORY_TRACT | Status: AC
  Administered 2017-02-06: 3 mL via RESPIRATORY_TRACT
  Filled 2017-02-06: qty 3

## 2017-02-06 MED ORDER — ALBUTEROL SULFATE HFA 108 (90 BASE) MCG/ACT IN AERS
2.0000 | INHALATION_SPRAY | Freq: Once | RESPIRATORY_TRACT | Status: AC
  Administered 2017-02-06: 2 via RESPIRATORY_TRACT
  Filled 2017-02-06: qty 6.7

## 2017-02-06 MED ORDER — IPRATROPIUM BROMIDE 0.02 % IN SOLN: 0.5000 mg | Freq: Once | RESPIRATORY_TRACT | Status: DC

## 2017-02-06 MED ORDER — SODIUM CHLORIDE 0.9 % IV BOLUS (SEPSIS)
1000.0000 mL | Freq: Once | INTRAVENOUS | Status: AC
  Administered 2017-02-06: 1000 mL via INTRAVENOUS

## 2017-02-06 NOTE — ED Provider Notes (Signed)
[facility] EMERGENCY DEPARTMENT Provider Note   CSN: [id number] Arrival date & time: 02/06/17  0801     History   Chief Complaint Chief Complaint  Patient presents with  . Fatigue    HPI [name] is a 57 y.o. female.  Patient is a 57 year old female who presents to the emergency department with a complaint of fatigue and cough.  Patient states she has a history of asthma, she has had a history of left lung cancer, and she suffers from some shortness of breath with dyspnea and exertion.  Patient states that over the past 2 weeks she has been having problems with cough, congestion, diarrhea, and being weak.  She feels very fatigued with minimal exertion.  She feels that this problem is getting worse, as it is becoming more difficult to breathe in spite of her use of albuterol inhalers.  She has had 6 episodes of diarrhea on yesterday, and one today.  She states that the diarrhea has a awful odor to it.  She has had one episode of vomiting.  There is been no blood in the vomiting or in the diarrhea.  She also complains of her right side hurting from time to time.  She has been using her albuterol inhaler, but states these do not seem to be working anymore.  Her temperature maximum has been 101.  She presents to the emergency department at this time for evaluation.      Past Medical History:  Diagnosis Date  . Anxiety   . Asthma    every now and then.  Wheezing and coughing  . Cancer ([place]) 1997   left lung  . Chronic abdominal pain   . Chronic back pain   . Chronic joint pain   . Depression   . GERD (gastroesophageal reflux disease)   . HLD (hyperlipidemia)   . HTN (hypertension)   . Nausea and vomiting    chronic, recurrent  . Osteoarthrosis involving more than one site but not generalized   . Palpitations 01/2013   chronic, intermittent  . Shortness of breath dyspnea    with exertion    Patient Active Problem List   Diagnosis Date Noted  . Dysphagia 01/20/2017    . Left hip pain 01/19/2017  . Right hand pain 01/19/2017  . Diarrhea 10/11/2016  . Right hip pain 06/07/2016  . Dysuria 04/14/2016  . Malaise and fatigue 04/14/2016  . Breast pain, left 11/06/2015  . Mass of left forearm 10/10/2015    Class: Chronic  . Dysphagia, oropharyngeal   . Gastroesophageal reflux disease without esophagitis   . Carpal tunnel syndrome, right 07/11/2015    Class: Chronic  . Prediabetes 07/09/2015  . Lumbar stenosis with neurogenic claudication 01/05/2015  . Spondylolisthesis of lumbar region 01/03/2015    Class: Chronic  . Spinal stenosis, lumbar region, with neurogenic claudication 01/03/2015    Class: Chronic  . Soft tissue mass 07/23/2014    Class: Acute  . Coccygodynia 07/23/2014    Class: Acute  . Healthcare maintenance 03/19/2014  . Constipation 02/15/2014  . Dysphagia, pharyngoesophageal phase   . Abdominal pain 10/03/2013  . Obesity, unspecified 02/15/2013  . Genital herpes 10/12/2012  . HSV (herpes simplex virus) anogenital infection 09/29/2012  . UNSPECIFIED SLEEP APNEA 12/04/2008  . ADENOCARCINOMA, LUNG 10/09/2008  . Osteoarthritis of multiple joints 07/13/2006  . HYPERCHOLESTEROLEMIA 03/17/2006  . DEPRESSION, MAJOR, RECURRENT 03/17/2006  . HYPERTENSION, BENIGN SYSTEMIC 03/17/2006  . GASTROESOPHAGEAL REFLUX, NO ESOPHAGITIS 03/17/2006  . INSOMNIA NOS  03/17/2006    Past Surgical History:  Procedure Laterality Date  . ABDOMINAL HYSTERECTOMY    . ABDOMINAL SURGERY     ovarian cyst removal  . BIOPSY N/A 11/22/2013   Procedure: GASTRIC BIOPSY;  Surgeon: Daneil Dolin, MD;  [facility];  Service: Endoscopy;  Laterality: N/A;  . BLADDER SURGERY  suspension x4   x 4  . CARPAL TUNNEL RELEASE Right 07/11/2015   Procedure: RIGHT CARPAL TUNNEL RELEASE;  Surgeon: Jessy Oto, MD;  Location: [place];  Service: Orthopedics;  Laterality: Right;  . CHOLECYSTECTOMY N/A 04/05/2014   Procedure: LAPAROSCOPIC CHOLECYSTECTOMY;   Surgeon: Aviva Signs Md, MD;  Location: [facility];  Service: General;  Laterality: N/A;  . COCCYGECTOMY N/A 04/11/2015   Procedure: COCCYGECTOMY WITH OSTEOTOMY THROUGH AREA OF DEFORMITY;  Surgeon: Jessy Oto, MD;  Location: [place];  Service: Orthopedics;  Laterality: N/A;  . COLONOSCOPY    . COLONOSCOPY WITH PROPOFOL N/A 11/22/2013   Dr. Gala Romney: Grade 3 and 4/internal hemorrhoids?"likely source of hematochezia Normal colonoscopy otherwise.   . ESOPHAGOGASTRODUODENOSCOPY (EGD) WITH PROPOFOL N/A 11/22/2013   Dr. Gala Romney: Incomplete Schatzki's ring dilated and disrupted as described above.  Small hiatal hernia. Focally abnormal gastric mucosa of uncertain significance status post biopsy, reactive gastropathy, negative H.pylori  . ESOPHAGOGASTRODUODENOSCOPY (EGD) WITH PROPOFOL N/A 08/04/2015   Dr. Gala Romney: mild Schatzki's ring s/p dilation, small hiatal hernia   . EXCISION VAGINAL CYST Left 06/20/2012   Procedure: EXCISION LEFT LABIAL CYST;  Surgeon: Jonnie Kind, MD;  [facility];  Service: Gynecology;  Laterality: Left;  . LOBECTOMY Left   . LUNG CANCER SURGERY  1997   age 54, resection only  . MALONEY DILATION N/A 11/22/2013   Procedure: [name] DILATION;  Surgeon: Daneil Dolin, MD;  [facility];  Service: Endoscopy;  Laterality: N/A;  56  . MALONEY DILATION N/A 08/04/2015   Procedure: [name] DILATION;  Surgeon: Daneil Dolin, MD;  [facility];  Service: Endoscopy;  Laterality: N/A;  . MASS EXCISION N/A 07/23/2014   Procedure: EXCISIONAL BIOPSY NODULE LEFT PARACOCCYGEAL AREA;  Surgeon: Jessy Oto, MD;  Location: [place];  Service: Orthopedics;  Laterality: N/A;  . MASS EXCISION Left 10/10/2015   Procedure: EXCISIONAL BIOPSY OF LEFT DORSORADIAL FOREARM MASS LIPOMA;  Surgeon: Jessy Oto, MD;  Location: [place];  Service: Orthopedics;  Laterality: Left;  . TUBAL LIGATION      OB History    Gravida Para Term Preterm AB Living   3 3 3     3    SAB TAB Ectopic  Multiple Live Births                   Home Medications    Prior to Admission medications   Medication Sig Start Date End Date Taking? Authorizing Provider  acyclovir (ZOVIRAX) 400 MG tablet TAKE 1 TABLET BY MOUTH TWICE DAILY 10/26/16  Yes Verner Mould, MD  albuterol [facility] HFA) 108 (90 Base) MCG/ACT inhaler Inhale 2 puffs into the lungs every 6 (six) hours as needed for wheezing or shortness of breath. 11/02/16  Yes Verner Mould, MD  amitriptyline (ELAVIL) 150 MG tablet TAKE 1 TABLET BY MOUTH ONCE DAILY WITH BREAKFAST Patient taking differently: TAKE 1 TABLET BY MOUTH ONCE DAILY AT NIGHT 11/19/16  Yes Verner Mould, MD  atorvastatin (LIPITOR) 40 MG tablet TAKE ONE TABLET BY MOUTH ONCE DAILY 08/23/16  Yes Bufford Lope, DO  DEXILANT 60 MG capsule TAKE ONE CAPSULE BY MOUTH ONCE DAILY 09/22/16  Yes Mahala Menghini, PA-C  DULoxetine (CYMBALTA) 60 MG capsule Take 1 capsule (60 mg total) by mouth 2 (two) times daily. 02/26/16  Yes Verner Mould, MD  gabapentin (NEURONTIN) 300 MG capsule TAKE 1 CAPSULE BY MOUTH THREE TIMES DAILY 12/01/16  Yes Verner Mould, MD  hydrochlorothiazide (HYDRODIURIL) 25 MG tablet TAKE 1 TABLET BY MOUTH ONCE DAILY 12/01/16  Yes Verner Mould, MD  lubiprostone (AMITIZA) 24 MCG capsule Take 24 mcg by mouth 2 (two) times daily.   Yes [provider]  meloxicam (MOBIC) 15 MG tablet Take 1 tablet (15 mg total) by mouth daily. 01/19/17  Yes Verner Mould, MD  Multiple Vitamins-Minerals (HAIR SKIN AND NAILS FORMULA) TABS Take 2 tablets by mouth daily.   Yes [provider]  potassium chloride SA (K-DUR,KLOR-CON) 20 MEQ tablet Take 1 tablet (20 mEq total) by mouth 2 (two) times daily. 10/20/16  Yes Nat Christen, MD    Family History Family History  Problem Relation Age of Onset  . Diabetes Other   . Heart disease Other        has Psychologist, forensic  . Hypertension Mother   . Kidney disease  Mother   . Hypertension Father   . Kidney disease Father   . Heart disease Father   . Cancer Father        leukemia  . Hypertension Sister   . Hypertension Sister   . Colon cancer Neg Hx     Social History Social History   Tobacco Use  . Smoking status: Former Smoker    Packs/day: 1.50    Years: 25.00    Pack years: 37.50    Types: Cigarettes    Last attempt to quit: 04/07/1995    Years since quitting: 21.8  . Smokeless tobacco: Never Used  Substance Use Topics  . Alcohol use: No  . Drug use: No     Allergies   Promethazine hcl   Review of Systems Review of Systems  Constitutional: Positive for activity change, fatigue and fever.       All ROS Neg except as noted in HPI  HENT: Positive for congestion. Negative for nosebleeds.   Eyes: Negative for photophobia and discharge.  Respiratory: Positive for wheezing. Negative for cough and shortness of breath.   Cardiovascular: Negative for chest pain and palpitations.  Gastrointestinal: Positive for diarrhea, nausea and vomiting. Negative for abdominal pain and blood in stool.  Genitourinary: Negative for dysuria, frequency and hematuria.  Musculoskeletal: Negative for arthralgias, back pain and neck pain.  Skin: Negative.   Neurological: Negative for dizziness, seizures and speech difficulty.  Psychiatric/Behavioral: Negative for confusion and hallucinations.     Physical Exam Updated Vital Signs BP (!) 172/80 (BP Location: Right Arm)   Pulse 80   Temp 98.2 F (36.8 C) (Oral)   Resp 18   Ht 5\' 5"  (1.651 m)   Wt 77.6 kg (171 lb)   SpO2 94%   BMI 28.46 kg/m   Physical Exam  Constitutional: She is oriented to person, place, and time. She appears well-developed and well-nourished.  Non-toxic appearance.  HENT:  Head: Normocephalic.  Right Ear: Tympanic membrane and external ear normal.  Left Ear: Tympanic membrane and external ear normal.  Eyes: EOM and lids are normal. Pupils are equal, round, and reactive  to light.  Neck: Normal range of motion. Neck supple. Carotid bruit is not present.  Cardiovascular:  Normal rate, regular rhythm, normal heart sounds, intact distal pulses and normal pulses.  Pulmonary/Chest: No accessory muscle usage. No respiratory distress. She has wheezes.  There is symmetrical rise and fall of the chest.  The patient speaks in complete sentences without problem.  Abdominal: Soft. Bowel sounds are normal. There is no tenderness. There is no guarding.  Musculoskeletal: Normal range of motion.  Lymphadenopathy:       Head (right side): No submandibular adenopathy present.       Head (left side): No submandibular adenopathy present.    She has no cervical adenopathy.  Neurological: She is alert and oriented to person, place, and time. She has normal strength. No cranial nerve deficit or sensory deficit.  Skin: Skin is warm and dry.  Psychiatric: She has a normal mood and affect. Her speech is normal.  Nursing note and vitals reviewed.    ED Treatments / Results  Labs (all labs ordered are listed, but only abnormal results are displayed) Labs Reviewed  URINALYSIS, ROUTINE W REFLEX MICROSCOPIC  INFLUENZA PANEL BY PCR (TYPE A & B)    EKG  EKG Interpretation None       Radiology No results found.  Procedures Procedures (including critical care time)  Medications Ordered in ED Medications - No data to display   Initial Impression / Assessment and Plan / ED Course  I have reviewed the triage vital signs and the nursing notes.  Pertinent labs & imaging results that were available during my care of the patient were reviewed by me and considered in my medical decision making (see chart for details).      Final Clinical Impressions(s) / ED Diagnoses MDM Pulse oximetry on admission is 94% on room air.  Patient has a history of asthma.  Patient also reports a productive cough accompanied by vomiting diarrhea and a temperature maximum of 101.  Patient  treated with DuoNeb as well as steroids.  Patient is negative for influenza.  Urine analysis is well within normal limits.  Chest x-ray shows postoperative changes on the left but no new or acute changes or problems.  Patient states she feels better after the DuoNeb and steroids.  Patient ambulated in the hallway without any evidence of any desaturation.  The patient will use albuterol at home as well as oral steroids.  The patient will return to the emergency department if any changes, problems, or concerns.     Final diagnoses:  None    ED Discharge Orders    None       Lily Kocher, PA-C 02/06/17 1711    Mesner, Corene Cornea, MD 02/07/17 1032

## 2017-02-06 NOTE — Discharge Instructions (Signed)
Your vital signs are within normal limits. Your chest xray is negative for pneumonia. Please use albuterol every 4 hours.  Please increase fluids. Please wash hands with soap and water until stool samples can be tested. Use your mask until symptoms resolved. Please see your MD or return to the ED if any changes or problems.

## 2017-02-06 NOTE — ED Triage Notes (Signed)
Patient reports of generalized weakness x2 weeks. Reports of productive cough, vomiting and diarrhea. Reports of temp. 101. Has not taken anything otc.

## 2017-02-06 NOTE — ED Notes (Signed)
ED Provider at bedside. 

## 2017-02-06 NOTE — ED Notes (Signed)
Ambulated pt around nurses station with pulse ox. Pt's o2 consistently remained at 96% during ambulation.

## 2017-02-07 ENCOUNTER — Encounter (HOSPITAL_COMMUNITY): Payer: Self-pay

## 2017-02-08 ENCOUNTER — Encounter (HOSPITAL_COMMUNITY)
Admission: RE | Admit: 2017-02-08 | Discharge: 2017-02-08 | Disposition: A | Discharge status: Home / Self Care | Payer: Medicare Other | Source: Ambulatory Visit | Attending: Internal Medicine | Admitting: Internal Medicine

## 2017-02-11 ENCOUNTER — Other Ambulatory Visit: Payer: Self-pay | Admitting: *Deleted

## 2017-02-11 ENCOUNTER — Ambulatory Visit (HOSPITAL_COMMUNITY)
Admission: RE | Admit: 2017-02-11 | Discharge: 2017-02-11 | Disposition: A | Discharge status: Home / Self Care | Payer: Medicare Other | Source: Ambulatory Visit | Attending: Gastroenterology | Admitting: Gastroenterology

## 2017-02-11 ENCOUNTER — Encounter (HOSPITAL_COMMUNITY): Payer: Self-pay

## 2017-02-11 DIAGNOSIS — R1084 Generalized abdominal pain: Secondary | ICD-10-CM | POA: Diagnosis not present

## 2017-02-11 DIAGNOSIS — R109 Unspecified abdominal pain: Secondary | ICD-10-CM | POA: Diagnosis not present

## 2017-02-11 DIAGNOSIS — I700 Atherosclerosis of aorta: Secondary | ICD-10-CM | POA: Diagnosis not present

## 2017-02-11 DIAGNOSIS — K561 Intussusception: Secondary | ICD-10-CM

## 2017-02-11 MED ORDER — IOPAMIDOL (ISOVUE-300) INJECTION 61%
100.0000 mL | Freq: Once | INTRAVENOUS | Status: AC | PRN
  Administered 2017-02-11: 100 mL via INTRAVENOUS

## 2017-02-11 NOTE — Progress Notes (Signed)
Area of probable jejunal-jejunal intussusception noted, 2 cm length. NO evidence of bowel obstruction, inflammation. I contacted patient personally and spoke with her. She has chronic intermittent abdominal discomfort, NO N/V, no obstructive signs. Discussed in detail presented to ED if abdominal distension, N/V. We need to cancel the EGD for Monday, 1/28. She needs a CT enterography NEXT WEEK, reason: intussusception. We need to ensure this is not persistent and/or there is not a lesion that is causing this. IF so, she will need surgical referral. [facility] clinical pool: please cancel EGD 1/28 and order CTE. Thanks!

## 2017-02-14 ENCOUNTER — Encounter (HOSPITAL_COMMUNITY): Admission: RE | Payer: Self-pay | Source: Ambulatory Visit

## 2017-02-14 ENCOUNTER — Ambulatory Visit (HOSPITAL_COMMUNITY): Admission: RE | Admit: 2017-02-14 | Payer: Medicare Other | Source: Ambulatory Visit | Admitting: Internal Medicine

## 2017-02-14 SURGERY — ESOPHAGOGASTRODUODENOSCOPY (EGD) WITH PROPOFOL
Anesthesia: Monitor Anesthesia Care

## 2017-02-15 ENCOUNTER — Ambulatory Visit (HOSPITAL_COMMUNITY)
Admission: RE | Admit: 2017-02-15 | Discharge: 2017-02-15 | Disposition: A | Discharge status: Home / Self Care | Payer: Medicare Other | Source: Ambulatory Visit | Attending: Gastroenterology | Admitting: Gastroenterology

## 2017-02-15 ENCOUNTER — Other Ambulatory Visit: Payer: Self-pay | Admitting: Gastroenterology

## 2017-02-15 DIAGNOSIS — K561 Intussusception: Secondary | ICD-10-CM | POA: Insufficient documentation

## 2017-02-15 DIAGNOSIS — R160 Hepatomegaly, not elsewhere classified: Secondary | ICD-10-CM | POA: Diagnosis not present

## 2017-02-15 DIAGNOSIS — I700 Atherosclerosis of aorta: Secondary | ICD-10-CM | POA: Insufficient documentation

## 2017-02-15 MED ORDER — IOPAMIDOL (ISOVUE-300) INJECTION 61%
150.0000 mL | Freq: Once | INTRAVENOUS | Status: AC | PRN
  Administered 2017-02-15: 125 mL via INTRAVENOUS

## 2017-02-16 ENCOUNTER — Other Ambulatory Visit: Payer: Self-pay | Admitting: *Deleted

## 2017-02-16 ENCOUNTER — Ambulatory Visit (INDEPENDENT_AMBULATORY_CARE_PROVIDER_SITE_OTHER): Payer: Medicare Other | Admitting: Specialist

## 2017-02-16 DIAGNOSIS — E7800 Pure hypercholesterolemia, unspecified: Secondary | ICD-10-CM

## 2017-02-16 MED ORDER — ATORVASTATIN CALCIUM 40 MG PO TABS: 40.0000 mg | ORAL_TABLET | Freq: Every day | ORAL | 3 refills | Status: DC

## 2017-02-17 ENCOUNTER — Telehealth: Payer: Self-pay | Admitting: Internal Medicine

## 2017-02-17 NOTE — Telephone Encounter (Signed)
Pt called asking if her CT results were available. Please call [phone]

## 2017-02-18 NOTE — Telephone Encounter (Signed)
Noted, documentation in result note.

## 2017-02-18 NOTE — Telephone Encounter (Signed)
Please see result note 

## 2017-02-18 NOTE — Telephone Encounter (Signed)
Pt is inquiring about her results.

## 2017-02-18 NOTE — Progress Notes (Signed)
CTE looks normal. This is good! I suggest she come back in to see me before arranging EGD. There are changes in the liver concerning for possible early cirrhosis. She needs an EGD. We could actually go ahead and put her on the books but have her return so I can make sure abdominal pain is improved and that we don't need to do anything additional in interim. May use an urgent.

## 2017-02-21 NOTE — Progress Notes (Signed)
PATIENT SCHEDULED AND CALLED FOR SOONER APPT

## 2017-03-01 ENCOUNTER — Telehealth: Payer: Self-pay | Admitting: *Deleted

## 2017-03-01 ENCOUNTER — Encounter: Payer: Self-pay | Admitting: *Deleted

## 2017-03-01 ENCOUNTER — Other Ambulatory Visit: Payer: Self-pay | Admitting: *Deleted

## 2017-03-01 ENCOUNTER — Ambulatory Visit (INDEPENDENT_AMBULATORY_CARE_PROVIDER_SITE_OTHER): Payer: Medicare Other | Admitting: Gastroenterology

## 2017-03-01 ENCOUNTER — Encounter: Payer: Self-pay | Admitting: Gastroenterology

## 2017-03-01 VITALS — BP 141/75 | HR 87 | Temp 96.8°F | Ht 65.0 in | Wt 182.2 lb

## 2017-03-01 DIAGNOSIS — R1310 Dysphagia, unspecified: Secondary | ICD-10-CM

## 2017-03-01 DIAGNOSIS — R748 Abnormal levels of other serum enzymes: Secondary | ICD-10-CM

## 2017-03-01 DIAGNOSIS — R1010 Upper abdominal pain, unspecified: Secondary | ICD-10-CM | POA: Diagnosis not present

## 2017-03-01 NOTE — H&P (View-Only) (Signed)
  Referring Provider: Lancaster, Abigail Jose* Primary Care Physician:  Lancaster, Abigail Joseph, MD Primary GI: Dr. Rourk   Chief Complaint  Patient presents with  . Dysphagia    needs to reschedule EGD/DIL    HPI:   [name] is a 57 y.o. female presenting today with a history of chronic constipation, GERD, dysphagia. Multiple dilations in past, most recently in July 2017 of mild Schatzki's ring.She has had notable improvement with dilations in the past. History of chronically elevated alk phos with serologic markers negative for PBC. Hep BsAg and Hep C antibody negative several years ago. GGT normal. Normal transaminases.   Dysphagia: has been evaluated extensively. Prior evaluation inlcudes ENT and Speech Pathologist.She underwent a laryngoscopy and noted moderate laryngeal edema. She also had endoscopic cauterization of left anterior and superior nasal septum due to chronic nose bleeds. I discussed patient's clinical scenario with Dabney Porter, Speech Pathologist as well.She does have a prominent cricopharyngeal muscle and POSSIBLY the start of a very small Zenker's diverticulum on frame 3 of MBSS. Update BPE Nov 2017 was normal andrecommended for possible manometry, which she declined.   She was seen Jan 2019 to arranged EGD/dilation. She was experiencing constipation and noted abdominal pain. She has done best with Amitiza 24 mcg. CT abd/pelvis with short length probably jejunal-jejunal intussusception, felt to be transient. CT enterography then ordered to evaluate for any small bowel abnormality: this was normal. There was question of morphologic changes that could reflect early cirrhosis.   She notes upper abdominal pain after eating, associated bloating. Taking Dexilant once daily. Dysphagia noted with any types of textures. Has been having constipation and only taking Amitiza once daily (24 mcg). No weight loss noted.   Past Medical History:  Diagnosis Date  . Anxiety     . Asthma    every now and then.  Wheezing and coughing  . Cancer (H[facility]) 1997   left lung  . Chronic abdominal pain   . Chronic back pain   . Chronic joint pain   . Depression   . GERD (gastroesophageal reflux disease)   . HLD (hyperlipidemia)   . HTN (hypertension)   . Nausea and vomiting    chronic, recurrent  . Osteoarthrosis involving more than one site but not generalized   . Palpitations 01/2013   chronic, intermittent  . Shortness of breath dyspnea    with exertion    Past Surgical History:  Procedure Laterality Date  . ABDOMINAL HYSTERECTOMY    . ABDOMINAL SURGERY     ovarian cyst removal  . BIOPSY N/A 11/22/2013   Procedure: GASTRIC BIOPSY;  Surgeon: Robert M Rourk, MD;  Location: [facility];  Service: Endoscopy;  Laterality: N/A;  . BLADDER SURGERY  suspension x4   x 4  . CARPAL TUNNEL RELEASE Right 07/11/2015   Procedure: RIGHT CARPAL TUNNEL RELEASE;  Surgeon: James E Nitka, MD;  Location: [facility];  Service: Orthopedics;  Laterality: Right;  . CHOLECYSTECTOMY N/A 04/05/2014   Procedure: LAPAROSCOPIC CHOLECYSTECTOMY;  Surgeon: Mark Jenkins Md, MD;  Location: [facility];  Service: General;  Laterality: N/A;  . COCCYGECTOMY N/A 04/11/2015   Procedure: COCCYGECTOMY WITH OSTEOTOMY THROUGH AREA OF DEFORMITY;  Surgeon: James E Nitka, MD;  Location: [facility];  Service: Orthopedics;  Laterality: N/A;  . COLONOSCOPY    . COLONOSCOPY WITH PROPOFOL N/A 11/22/2013   Dr. Rourk: Grade 3 and 4/internal hemorrhoids?"likely source of hematochezia Normal colonoscopy otherwise.   . ESOPHAGOGASTRODUODENOSCOPY (EGD) WITH PROPOFOL N/A   11/22/2013   Dr. Rourk: Incomplete Schatzki's ring dilated and disrupted as described above.  Small hiatal hernia. Focally abnormal gastric mucosa of uncertain significance status post biopsy, reactive gastropathy, negative H.pylori  . ESOPHAGOGASTRODUODENOSCOPY (EGD) WITH PROPOFOL N/A 08/04/2015   Dr. Rourk: mild Schatzki's ring s/p dilation, small hiatal  hernia   . EXCISION VAGINAL CYST Left 06/20/2012   Procedure: EXCISION LEFT LABIAL CYST;  Surgeon: John V Ferguson, MD;  Location: [facility];  Service: Gynecology;  Laterality: Left;  . LOBECTOMY Left   . LUNG CANCER SURGERY  1997   age 57, resection only  . MALONEY DILATION N/A 11/22/2013   Procedure: MALONEY DILATION;  Surgeon: Robert M Rourk, MD;  Location: [facility];  Service: Endoscopy;  Laterality: N/A;  57  . MALONEY DILATION N/A 08/04/2015   Procedure: MALONEY DILATION;  Surgeon: Robert M Rourk, MD;  [facility];  Service: Endoscopy;  Laterality: N/A;  . MASS EXCISION N/A 07/23/2014   Procedure: EXCISIONAL BIOPSY NODULE LEFT PARACOCCYGEAL AREA;  Surgeon: James E Nitka, MD;  Location: [facility];  Service: Orthopedics;  Laterality: N/A;  . MASS EXCISION Left 10/10/2015   Procedure: EXCISIONAL BIOPSY OF LEFT DORSORADIAL FOREARM MASS LIPOMA;  Surgeon: James E Nitka, MD;  Location: [facility];  Service: Orthopedics;  Laterality: Left;  . TUBAL LIGATION      Current Outpatient Medications  Medication Sig Dispense Refill  . acyclovir (ZOVIRAX) 400 MG tablet TAKE 1 TABLET BY MOUTH TWICE DAILY 60 tablet 5  . albuterol (PROAIR HFA) 108 (90 Base) MCG/ACT inhaler Inhale 2 puffs into the lungs every 6 (six) hours as needed for wheezing or shortness of breath. 1 Inhaler 2  . amitriptyline (ELAVIL) 150 MG tablet TAKE 1 TABLET BY MOUTH ONCE DAILY WITH BREAKFAST (Patient taking differently: TAKE 1 TABLET BY MOUTH ONCE DAILY AT NIGHT) 30 tablet 5  . atorvastatin (LIPITOR) 40 MG tablet Take 1 tablet (40 mg total) by mouth daily. 90 tablet 3  . DEXILANT 60 MG capsule TAKE ONE CAPSULE BY MOUTH ONCE DAILY 90 capsule 3  . DULoxetine (CYMBALTA) 60 MG capsule Take 1 capsule (60 mg total) by mouth 2 (two) times daily. 120 capsule 11  . gabapentin (NEURONTIN) 300 MG capsule TAKE 1 CAPSULE BY MOUTH THREE TIMES DAILY 90 capsule 2  . hydrochlorothiazide (HYDRODIURIL) 25 MG tablet TAKE 1 TABLET BY MOUTH  ONCE DAILY 90 tablet 2  . lubiprostone (AMITIZA) 24 MCG capsule Take 1 capsule (24 mcg total) by mouth daily with breakfast. 30 capsule 3  . meloxicam (MOBIC) 15 MG tablet Take 1 tablet (15 mg total) by mouth daily. 30 tablet 2  . Multiple Vitamins-Minerals (HAIR SKIN AND NAILS FORMULA) TABS Take 2 tablets by mouth daily.    . potassium chloride SA (K-DUR,KLOR-CON) 20 MEQ tablet Take 1 tablet (20 mEq total) by mouth 2 (two) times daily. 20 tablet 0   No current facility-administered medications for this visit.     Allergies as of 03/01/2017 - Review Complete 03/01/2017  Allergen Reaction Noted  . Promethazine hcl Nausea And Vomiting and Other (See Comments) 07/26/2008    Family History  Problem Relation Age of Onset  . Diabetes Other   . Heart disease Other        has pace maker  . Hypertension Mother   . Kidney disease Mother   . Hypertension Father   . Kidney disease Father   . Heart disease Father   . Cancer Father          leukemia  . Hypertension Sister   . Hypertension Sister   . Colon cancer Neg Hx     Social History   Socioeconomic History  . Marital status: Married    Spouse name: None  . Number of children: 3  . Years of education: None  . Highest education level: None  Social Needs  . Financial resource strain: None  . Food insecurity - worry: None  . Food insecurity - inability: None  . Transportation needs - medical: None  . Transportation needs - non-medical: None  Occupational History  . None  Tobacco Use  . Smoking status: Former Smoker    Packs/day: 1.50    Years: 25.00    Pack years: 37.50    Types: Cigarettes    Last attempt to quit: 04/07/1995    Years since quitting: 21.9  . Smokeless tobacco: Never Used  Substance and Sexual Activity  . Alcohol use: No  . Drug use: No  . Sexual activity: Not Currently    Birth control/protection: Surgical  Other Topics Concern  . None  Social History Narrative  . None    Review of Systems: Gen:  Denies fever, chills, anorexia. Denies fatigue, weakness, weight loss.  CV: Denies chest pain, palpitations, syncope, peripheral edema, and claudication. Resp: Denies dyspnea at rest, cough, wheezing, coughing up blood, and pleurisy. GI: see HPI  Derm: Denies rash, itching, dry skin Psych: Denies depression, anxiety, memory loss, confusion. No homicidal or suicidal ideation.  Heme: Denies bruising, bleeding, and enlarged lymph nodes.  Physical Exam: BP (!) 141/75   Pulse 87   Temp (!) 96.8 F (36 C) (Oral)   Ht 5' 5" (1.651 m)   Wt 182 lb 3.2 oz (82.6 kg)   BMI 30.32 kg/m  General:   Alert and oriented. No distress noted. Pleasant and cooperative.  Head:  Normocephalic and atraumatic. Eyes:  Conjuctiva clear without scleral icterus. Mouth:  Oral mucosa pink and moist.  Abdomen:  +BS, soft, non-tender and non-distended. No rebound or guarding. No HSM or masses noted. Msk:  Symmetrical without gross deformities. Normal posture. Extremities:  Without edema. Neurologic:  Alert and  oriented x4 Psych:  Alert and cooperative. Normal mood and affect.   

## 2017-03-01 NOTE — Progress Notes (Signed)
Referring Provider: Sydnee Levans* Primary Care Physician:  Verner Mould, MD Primary GI: Dr. Gala Romney   Chief Complaint  Patient presents with  . Dysphagia    needs to reschedule EGD/DIL    HPI:   [name] is a 57 y.o. female presenting today with a history of chronic constipation, GERD, dysphagia. Multiple dilations in past, most recently in July 2017 of mild Schatzki's ring.She has had notable improvement with dilations in the past. History of chronically elevated alk phos with serologic markers negative for PBC. Hep BsAg and Hep C antibody negative several years ago. GGT normal. Normal transaminases.   Dysphagia: has been evaluated extensively. Prior evaluation inlcudes ENT and Speech Pathologist.She underwent a laryngoscopy and noted moderate laryngeal edema. She also had endoscopic cauterization of left anterior and superior nasal septum due to chronic nose bleeds. I discussed patient's clinical scenario with Genene Churn, Speech Pathologist as well.She does have a prominent cricopharyngeal muscle and POSSIBLY the start of a very small Zenker's diverticulum on frame 3 of MBSS. Update BPE Nov 2017 was normal andrecommended for possible manometry, which she declined.   She was seen Jan 2019 to arranged EGD/dilation. She was experiencing constipation and noted abdominal pain. She has done best with Amitiza 24 mcg. CT abd/pelvis with short length probably jejunal-jejunal intussusception, felt to be transient. CT enterography then ordered to evaluate for any small bowel abnormality: this was normal. There was question of morphologic changes that could reflect early cirrhosis.   She notes upper abdominal pain after eating, associated bloating. Taking Dexilant once daily. Dysphagia noted with any types of textures. Has been having constipation and only taking Amitiza once daily (24 mcg). No weight loss noted.   Past Medical History:  Diagnosis Date  . Anxiety     . Asthma    every now and then.  Wheezing and coughing  . Cancer ([place]) 1997   left lung  . Chronic abdominal pain   . Chronic back pain   . Chronic joint pain   . Depression   . GERD (gastroesophageal reflux disease)   . HLD (hyperlipidemia)   . HTN (hypertension)   . Nausea and vomiting    chronic, recurrent  . Osteoarthrosis involving more than one site but not generalized   . Palpitations 01/2013   chronic, intermittent  . Shortness of breath dyspnea    with exertion    Past Surgical History:  Procedure Laterality Date  . ABDOMINAL HYSTERECTOMY    . ABDOMINAL SURGERY     ovarian cyst removal  . BIOPSY N/A 11/22/2013   Procedure: GASTRIC BIOPSY;  Surgeon: Daneil Dolin, MD;  Location: [facility];  Service: Endoscopy;  Laterality: N/A;  . BLADDER SURGERY  suspension x4   x 4  . CARPAL TUNNEL RELEASE Right 07/11/2015   Procedure: RIGHT CARPAL TUNNEL RELEASE;  Surgeon: Jessy Oto, MD;  Location: [place];  Service: Orthopedics;  Laterality: Right;  . CHOLECYSTECTOMY N/A 04/05/2014   Procedure: LAPAROSCOPIC CHOLECYSTECTOMY;  Surgeon: Aviva Signs Md, MD;  Location: [facility];  Service: General;  Laterality: N/A;  . COCCYGECTOMY N/A 04/11/2015   Procedure: COCCYGECTOMY WITH OSTEOTOMY THROUGH AREA OF DEFORMITY;  Surgeon: Jessy Oto, MD;  Location: [place];  Service: Orthopedics;  Laterality: N/A;  . COLONOSCOPY    . COLONOSCOPY WITH PROPOFOL N/A 11/22/2013   Dr. Gala Romney: Grade 3 and 4/internal hemorrhoids?"likely source of hematochezia Normal colonoscopy otherwise.   . ESOPHAGOGASTRODUODENOSCOPY (EGD) WITH PROPOFOL N/A  11/22/2013   Dr. Gala Romney: Incomplete Schatzki's ring dilated and disrupted as described above.  Small hiatal hernia. Focally abnormal gastric mucosa of uncertain significance status post biopsy, reactive gastropathy, negative H.pylori  . ESOPHAGOGASTRODUODENOSCOPY (EGD) WITH PROPOFOL N/A 08/04/2015   Dr. Gala Romney: mild Schatzki's ring s/p dilation, small hiatal  hernia   . EXCISION VAGINAL CYST Left 06/20/2012   Procedure: EXCISION LEFT LABIAL CYST;  Surgeon: Jonnie Kind, MD;  [facility]: [facility];  Service: Gynecology;  Laterality: Left;  . LOBECTOMY Left   . LUNG CANCER SURGERY  1997   age 20, resection only  . MALONEY DILATION N/A 11/22/2013   Procedure: [name] DILATION;  Surgeon: Daneil Dolin, MD;  Location: [facility];  Service: Endoscopy;  Laterality: N/A;  56  . MALONEY DILATION N/A 08/04/2015   Procedure: [name] DILATION;  Surgeon: Daneil Dolin, MD;  [facility];  Service: Endoscopy;  Laterality: N/A;  . MASS EXCISION N/A 07/23/2014   Procedure: EXCISIONAL BIOPSY NODULE LEFT PARACOCCYGEAL AREA;  Surgeon: Jessy Oto, MD;  Location: [place];  Service: Orthopedics;  Laterality: N/A;  . MASS EXCISION Left 10/10/2015   Procedure: EXCISIONAL BIOPSY OF LEFT DORSORADIAL FOREARM MASS LIPOMA;  Surgeon: Jessy Oto, MD;  Location: [place];  Service: Orthopedics;  Laterality: Left;  . TUBAL LIGATION      Current Outpatient Medications  Medication Sig Dispense Refill  . acyclovir (ZOVIRAX) 400 MG tablet TAKE 1 TABLET BY MOUTH TWICE DAILY 60 tablet 5  . albuterol (PROAIR HFA) 108 (90 Base) MCG/ACT inhaler Inhale 2 puffs into the lungs every 6 (six) hours as needed for wheezing or shortness of breath. 1 Inhaler 2  . amitriptyline (ELAVIL) 150 MG tablet TAKE 1 TABLET BY MOUTH ONCE DAILY WITH BREAKFAST (Patient taking differently: TAKE 1 TABLET BY MOUTH ONCE DAILY AT NIGHT) 30 tablet 5  . atorvastatin (LIPITOR) 40 MG tablet Take 1 tablet (40 mg total) by mouth daily. 90 tablet 3  . DEXILANT 60 MG capsule TAKE ONE CAPSULE BY MOUTH ONCE DAILY 90 capsule 3  . DULoxetine (CYMBALTA) 60 MG capsule Take 1 capsule (60 mg total) by mouth 2 (two) times daily. 120 capsule 11  . gabapentin (NEURONTIN) 300 MG capsule TAKE 1 CAPSULE BY MOUTH THREE TIMES DAILY 90 capsule 2  . hydrochlorothiazide (HYDRODIURIL) 25 MG tablet TAKE 1 TABLET BY MOUTH  ONCE DAILY 90 tablet 2  . lubiprostone (AMITIZA) 24 MCG capsule Take 1 capsule (24 mcg total) by mouth daily with breakfast. 30 capsule 3  . meloxicam (MOBIC) 15 MG tablet Take 1 tablet (15 mg total) by mouth daily. 30 tablet 2  . Multiple Vitamins-Minerals (HAIR SKIN AND NAILS FORMULA) TABS Take 2 tablets by mouth daily.    . potassium chloride SA (K-DUR,KLOR-CON) 20 MEQ tablet Take 1 tablet (20 mEq total) by mouth 2 (two) times daily. 20 tablet 0   No current facility-administered medications for this visit.     Allergies as of 03/01/2017 - Review Complete 03/01/2017  Allergen Reaction Noted  . Promethazine hcl Nausea And Vomiting and Other (See Comments) 07/26/2008    Family History  Problem Relation Age of Onset  . Diabetes Other   . Heart disease Other        has Psychologist, forensic  . Hypertension Mother   . Kidney disease Mother   . Hypertension Father   . Kidney disease Father   . Heart disease Father   . Cancer Father  leukemia  . Hypertension Sister   . Hypertension Sister   . Colon cancer Neg Hx     Social History   Socioeconomic History  . Marital status: Married    Spouse name: None  . Number of children: 3  . Years of education: None  . Highest education level: None  Social Needs  . Financial resource strain: None  . Food insecurity - worry: None  . Food insecurity - inability: None  . Transportation needs - medical: None  . Transportation needs - non-medical: None  Occupational History  . None  Tobacco Use  . Smoking status: Former Smoker    Packs/day: 1.50    Years: 25.00    Pack years: 37.50    Types: Cigarettes    Last attempt to quit: 04/07/1995    Years since quitting: 21.9  . Smokeless tobacco: Never Used  Substance and Sexual Activity  . Alcohol use: No  . Drug use: No  . Sexual activity: Not Currently    Birth control/protection: Surgical  Other Topics Concern  . None  Social History Narrative  . None    Review of Systems: Gen:  Denies fever, chills, anorexia. Denies fatigue, weakness, weight loss.  CV: Denies chest pain, palpitations, syncope, peripheral edema, and claudication. Resp: Denies dyspnea at rest, cough, wheezing, coughing up blood, and pleurisy. GI: see HPI  Derm: Denies rash, itching, dry skin Psych: Denies depression, anxiety, memory loss, confusion. No homicidal or suicidal ideation.  Heme: Denies bruising, bleeding, and enlarged lymph nodes.  Physical Exam: BP (!) 141/75   Pulse 87   Temp (!) 96.8 F (36 C) (Oral)   Ht _0  (1.651 m)   Wt 182 lb 3.2 oz (82.6 kg)   BMI 30.32 kg/m  General:   Alert and oriented. No distress noted. Pleasant and cooperative.  Head:  Normocephalic and atraumatic. Eyes:  Conjuctiva clear without scleral icterus. Mouth:  Oral mucosa pink and moist.  Abdomen:  +BS, soft, non-tender and non-distended. No rebound or guarding. No HSM or masses noted. Msk:  Symmetrical without gross deformities. Normal posture. Extremities:  Without edema. Neurologic:  Alert and  oriented x4 Psych:  Alert and cooperative. Normal mood and affect.

## 2017-03-01 NOTE — Patient Instructions (Signed)
We have scheduled you for an upper endoscopy with dilation and possible look at the first part of your small intestine (called an enteroscopy).   Please have blood work done today. We are also ordering a special ultrasound to see how "stiff" your liver is. We may or may not need to pursue further testing depending on blood work for your liver.   Increase Amitiza to twice a day. You can take once a day if you notice looser stools.  I will see you back in March 2019!  It was a pleasure to see you today. I strive to create trusting relationships with patients to provide genuine, compassionate, and quality care. I value your feedback. If you receive a survey regarding your visit,  I greatly appreciate you the taking time to fill this out.   Annitta Needs, PhD, ANP-BC [facility]

## 2017-03-01 NOTE — Telephone Encounter (Signed)
Spoke with pt and is aware pre-op scheduled for 03/10/17 at 1:15pm. Letter mailed

## 2017-03-02 ENCOUNTER — Ambulatory Visit (INDEPENDENT_AMBULATORY_CARE_PROVIDER_SITE_OTHER): Payer: Medicare Other | Admitting: Specialist

## 2017-03-02 ENCOUNTER — Encounter (INDEPENDENT_AMBULATORY_CARE_PROVIDER_SITE_OTHER): Payer: Self-pay | Admitting: Specialist

## 2017-03-02 ENCOUNTER — Ambulatory Visit (INDEPENDENT_AMBULATORY_CARE_PROVIDER_SITE_OTHER): Payer: Medicare Other

## 2017-03-02 VITALS — BP 135/72 | HR 87 | Ht 65.0 in | Wt 182.0 lb

## 2017-03-02 DIAGNOSIS — M48062 Spinal stenosis, lumbar region with neurogenic claudication: Secondary | ICD-10-CM

## 2017-03-02 DIAGNOSIS — M461 Sacroiliitis, not elsewhere classified: Secondary | ICD-10-CM

## 2017-03-02 DIAGNOSIS — M545 Low back pain: Secondary | ICD-10-CM | POA: Diagnosis not present

## 2017-03-02 DIAGNOSIS — M47816 Spondylosis without myelopathy or radiculopathy, lumbar region: Secondary | ICD-10-CM | POA: Diagnosis not present

## 2017-03-02 DIAGNOSIS — Z4889 Encounter for other specified surgical aftercare: Secondary | ICD-10-CM | POA: Diagnosis not present

## 2017-03-02 MED ORDER — METHYLPREDNISOLONE 4 MG PO TBPK: ORAL_TABLET | ORAL | 0 refills | Status: DC

## 2017-03-02 NOTE — Progress Notes (Signed)
Office Visit Note   Patient: [name]           Date of Birth: 04-Jul-1960           MRN: [id number] Visit Date: 03/02/2017              Requested by: Verner Mould, MD [address] [place], [place] [address] PCP: Verner Mould, MD   Assessment & Plan: Visit Diagnoses:  1. Low back pain, unspecified back pain laterality, unspecified chronicity, with sciatica presence unspecified   2. Spondylosis without myelopathy or radiculopathy, lumbar region   3. Sacroiliac inflammation ([place])   4. Lumbar stenosis with neurogenic claudication   5. Encounter for other specified surgical aftercare     Plan: Avoid bending, stooping and avoid lifting weights greater than 10 lbs. Avoid prolong standing and walking. Avoid frequent bending and stooping  No lifting greater than 10 lbs. May use ice or moist heat for pain. Weight loss is of benefit. Handicap license is approved. Lab test done today, HLA-B27, CRP and Uric acid to assess for an inflamatory disease of the spine. MRI with and without contrast to assess for lumbar spinal stenosis and joint inflamation.  Do not take narcotics. Steriod dose pak. Hold the use of mobic or alleve or advil or ibuprofen while taking the  Steroid dose pak.   Follow-Up Instructions: Return in about 3 weeks (around 03/23/2017).   Orders:  Orders Placed This Encounter  Procedures  . XR Lumbar Spine 2-3 Views   No orders of the defined types were placed in this encounter.     Procedures: No procedures performed   Clinical Data: No additional findings.   Subjective: Chief Complaint  Patient presents with  . Lower Back - Pain    57 year old female status post coccygectomy and right CTR. She reports falling from her bed before Thanksgiving and she caught herself. She felt pain for a couple of days. She was given a muscle relaxer which helps, she has pain with lying on on either side. She is having bilateral trochanteric pain  with lying on the sides. She is not exercising daily. Her back is painful, its all she can do to get up and go sit in the recliner then returns to bed before noon now for a couple of months. I has been going on since prior to Christmas.  Saw Dr. Avon Gully at [facility], and is taking gabapentin and meloxicam for the arthrosis of the spine.    Review of Systems  Constitutional: Negative.   HENT: Negative.   Eyes: Negative.   Respiratory: Negative.   Cardiovascular: Negative.   Gastrointestinal: Negative.   Endocrine: Negative.   Genitourinary: Negative.   Musculoskeletal: Negative.   Skin: Negative.   Allergic/Immunologic: Negative.   Neurological: Negative.   Hematological: Negative.   Psychiatric/Behavioral: Negative.      Objective: Vital Signs: BP 135/72 (BP Location: Left Arm, Patient Position: Sitting)   Pulse 87   Ht '5\' 5"'  (1.651 m)   Wt 182 lb (82.6 kg)   BMI 30.29 kg/m   Physical Exam  Constitutional: She is oriented to person, place, and time. She appears well-developed and well-nourished.  HENT:  Head: Normocephalic and atraumatic.  Eyes: EOM are normal. Pupils are equal, round, and reactive to light.  Neck: Normal range of motion. Neck supple.  Pulmonary/Chest: Effort normal and breath sounds normal.  Abdominal: Soft. Bowel sounds are normal.  Neurological: She is alert and  oriented to person, place, and time.  Skin: Skin is warm and dry.  Psychiatric: She has a normal mood and affect. Her behavior is normal. Judgment and thought content normal.    Back Exam   Tenderness  The patient is experiencing tenderness in the lumbar.  Range of Motion  Extension: abnormal  Flexion:  40 abnormal  Lateral bend right: abnormal  Lateral bend left: abnormal  Rotation right: abnormal  Rotation left: abnormal   Muscle Strength  Right Quadriceps:  5/5  Left Quadriceps:  5/5  Right Hamstrings:  5/5  Left Hamstrings:  5/5   Tests  Straight leg  raise right: negative Straight leg raise left: negative  Reflexes  Patellar: normal Achilles: normal Babinski's sign: normal   Other  Toe walk: normal Heel walk: normal Sensation: normal Gait: normal  Erythema: no back redness Scars: absent  Comments:  Pain with extension and lateral bending.      Specialty Comments:  No specialty comments available.  Imaging: Xr Lumbar Spine 2-3 Views  Result Date: 03/02/2017 [facility] and lateral flexion and extension radiographs show 1.7 mm of motion across the L4-5 fusion site. There is a residual right apex lumbar curve from L2 to L5 no listhesis, mild degenerative disc narrowing L5-S1. CT scans from 01/2017 abdomen show a fusion of the posterior elements at L4-5 with no interbody fusion at L4-5. The hardware, pedicle screws and rods are without lucency or signs of loosening. Severe Degenerative joint changes L3-4 and L5-S1 and bilateral SI joints. Hips show calcified areas of labrum superolaterally but the joint line is well maintained.     PMFS History: Patient Active Problem List   Diagnosis Date Noted  . Mass of left forearm 10/10/2015    Priority: High    Class: Chronic  . Carpal tunnel syndrome, right 07/11/2015    Priority: High    Class: Chronic  . Spondylolisthesis of lumbar region 01/03/2015    Priority: High    Class: Chronic  . Spinal stenosis, lumbar region, with neurogenic claudication 01/03/2015    Priority: High    Class: Chronic  . Soft tissue mass 07/23/2014    Priority: High    Class: Acute  . Dysphagia 01/20/2017  . Left hip pain 01/19/2017  . Right hand pain 01/19/2017  . Diarrhea 10/11/2016  . Right hip pain 06/07/2016  . Dysuria 04/14/2016  . Malaise and fatigue 04/14/2016  . Breast pain, left 11/06/2015  . Dysphagia, oropharyngeal   . Gastroesophageal reflux disease without esophagitis   . Prediabetes 07/09/2015  . Lumbar stenosis with neurogenic claudication 01/05/2015  . Coccygodynia 07/23/2014     Class: Acute  . Healthcare maintenance 03/19/2014  . Constipation 02/15/2014  . Dysphagia, pharyngoesophageal phase   . Elevated alkaline phosphatase level 11/06/2013  . Abdominal pain 10/03/2013  . Obesity, unspecified 02/15/2013  . Genital herpes 10/12/2012  . HSV (herpes simplex virus) anogenital infection 09/29/2012  . UNSPECIFIED SLEEP APNEA 12/04/2008  . ADENOCARCINOMA, LUNG 10/09/2008  . Osteoarthritis of multiple joints 07/13/2006  . HYPERCHOLESTEROLEMIA 03/17/2006  . DEPRESSION, MAJOR, RECURRENT 03/17/2006  . HYPERTENSION, BENIGN SYSTEMIC 03/17/2006  . GASTROESOPHAGEAL REFLUX, NO ESOPHAGITIS 03/17/2006  . INSOMNIA NOS 03/17/2006   Past Medical History:  Diagnosis Date  . Anxiety   . Asthma    every now and then.  Wheezing and coughing  . Cancer ([place]) 1997   left lung  . Chronic abdominal pain   . Chronic back pain   . Chronic joint pain   .  Depression   . GERD (gastroesophageal reflux disease)   . HLD (hyperlipidemia)   . HTN (hypertension)   . Nausea and vomiting    chronic, recurrent  . Osteoarthrosis involving more than one site but not generalized   . Palpitations 01/2013   chronic, intermittent  . Shortness of breath dyspnea    with exertion    Family History  Problem Relation Age of Onset  . Diabetes Other   . Heart disease Other        has Psychologist, forensic  . Hypertension Mother   . Kidney disease Mother   . Hypertension Father   . Kidney disease Father   . Heart disease Father   . Cancer Father        leukemia  . Hypertension Sister   . Hypertension Sister   . Colon cancer Neg Hx     Past Surgical History:  Procedure Laterality Date  . ABDOMINAL HYSTERECTOMY    . ABDOMINAL SURGERY     ovarian cyst removal  . BIOPSY N/A 11/22/2013   Procedure: GASTRIC BIOPSY;  Surgeon: Daneil Dolin, MD;  Location: [facility];  Service: Endoscopy;  Laterality: N/A;  . BLADDER SURGERY  suspension x4   x 4  . CARPAL TUNNEL RELEASE Right 07/11/2015   Procedure: RIGHT  CARPAL TUNNEL RELEASE;  Surgeon: Jessy Oto, MD;  Location: [facility];  Service: Orthopedics;  Laterality: Right;  . CHOLECYSTECTOMY N/A 04/05/2014   Procedure: LAPAROSCOPIC CHOLECYSTECTOMY;  Surgeon: Aviva Signs Md, MD;  Location: [facility];  Service: General;  Laterality: N/A;  . COCCYGECTOMY N/A 04/11/2015   Procedure: COCCYGECTOMY WITH OSTEOTOMY THROUGH AREA OF DEFORMITY;  Surgeon: Jessy Oto, MD;  Location: [facility];  Service: Orthopedics;  Laterality: N/A;  . COLONOSCOPY    . COLONOSCOPY WITH PROPOFOL N/A 11/22/2013   Dr. Gala Romney: Grade 3 and 4/internal hemorrhoids?"likely source of hematochezia Normal colonoscopy otherwise.   . ESOPHAGOGASTRODUODENOSCOPY (EGD) WITH PROPOFOL N/A 11/22/2013   Dr. Gala Romney: Incomplete Schatzki's ring dilated and disrupted as described above.  Small hiatal hernia. Focally abnormal gastric mucosa of uncertain significance status post biopsy, reactive gastropathy, negative H.pylori  . ESOPHAGOGASTRODUODENOSCOPY (EGD) WITH PROPOFOL N/A 08/04/2015   Dr. Gala Romney: mild Schatzki's ring s/p dilation, small hiatal hernia   . EXCISION VAGINAL CYST Left 06/20/2012   Procedure: EXCISION LEFT LABIAL CYST;  Surgeon: Jonnie Kind, MD;  Location: [facility];  Service: Gynecology;  Laterality: Left;  . LOBECTOMY Left   . LUNG CANCER SURGERY  1997   age 40, resection only  . MALONEY DILATION N/A 11/22/2013   Procedure: [name] DILATION;  Surgeon: Daneil Dolin, MD;  Location: [facility];  Service: Endoscopy;  Laterality: N/A;  56  . MALONEY DILATION N/A 08/04/2015   Procedure: [name] DILATION;  Surgeon: Daneil Dolin, MD;  [facility];  Service: Endoscopy;  Laterality: N/A;  . MASS EXCISION N/A 07/23/2014   Procedure: EXCISIONAL BIOPSY NODULE LEFT PARACOCCYGEAL AREA;  Surgeon: Jessy Oto, MD;  Location: [facility];  Service: Orthopedics;  Laterality: N/A;  . MASS EXCISION Left 10/10/2015   Procedure: EXCISIONAL BIOPSY OF LEFT DORSORADIAL FOREARM MASS LIPOMA;   Surgeon: Jessy Oto, MD;  Location: [facility];  Service: Orthopedics;  Laterality: Left;  . TUBAL LIGATION     Social History   Occupational History  . Not on file  Tobacco Use  . Smoking status: Former Smoker    Packs/day: 1.50    Years: 25.00  Pack years: 37.50    Types: Cigarettes    Last attempt to quit: 04/07/1995    Years since quitting: 21.9  . Smokeless tobacco: Never Used  Substance and Sexual Activity  . Alcohol use: No  . Drug use: No  . Sexual activity: Not Currently    Birth control/protection: Surgical

## 2017-03-02 NOTE — Patient Instructions (Addendum)
Avoid bending, stooping and avoid lifting weights greater than 10 lbs. Avoid prolong standing and walking. Avoid frequent bending and stooping  No lifting greater than 10 lbs. May use ice or moist heat for pain. Weight loss is of benefit. Handicap license is approved. Lab test done today, HLA-B27, CRP and Uric acid to assess for an inflamatory disease of the spine. MRI with and without contrast to assess for lumbar spinal stenosis and joint inflamation.  Do not take narcotics. Steriod dose pak. Hold the use of mobic or alleve or advil or ibuprofen while taking the  Steroid dose pak.

## 2017-03-03 MED ORDER — POTASSIUM CHLORIDE ER 10 MEQ PO TBCR: 20.0000 meq | EXTENDED_RELEASE_TABLET | Freq: Two times a day (BID) | ORAL | 0 refills | Status: DC

## 2017-03-06 LAB — ANTI-SMOOTH MUSCLE ANTIBODY, IGG

## 2017-03-06 LAB — COMPLETE METABOLIC PANEL WITH GFR
AG Ratio: 1.2 (calc) (ref 1.0–2.5)
ALBUMIN MSPROF: 4 g/dL (ref 3.6–5.1)
ALKALINE PHOSPHATASE (APISO): 145 U/L — AB (ref 33–130)
ALT: 17 U/L (ref 6–29)
AST: 18 U/L (ref 10–35)
BUN: 9 mg/dL (ref 7–25)
CO2: 31 mmol/L (ref 20–32)
CREATININE: 0.96 mg/dL (ref 0.50–1.05)
Calcium: 9.1 mg/dL (ref 8.6–10.4)
Chloride: 100 mmol/L (ref 98–110)
GFR, Est African American: 78 mL/min/{1.73_m2} (ref >=60)
GFR, Est Non African American: 67 mL/min/{1.73_m2} (ref >=60)
GLOBULIN: 3.3 g/dL (ref 1.9–3.7)
Glucose, Bld: 89 mg/dL (ref 65–139)
Potassium: 3.3 mmol/L — ABNORMAL LOW (ref 3.5–5.3)
SODIUM: 138 mmol/L (ref 135–146)
Total Bilirubin: 0.5 mg/dL (ref 0.2–1.2)
Total Protein: 7.3 g/dL (ref 6.1–8.1)

## 2017-03-06 LAB — ANTI-NUCLEAR AB-TITER (ANA TITER)

## 2017-03-06 LAB — ANA: Anti Nuclear Antibody(ANA): POSITIVE — AB

## 2017-03-06 LAB — IGG, IGA, IGM
IGG (IMMUNOGLOBIN G), SERUM: 1140 mg/dL (ref 694–1618)
IGM, SERUM: 69 mg/dL (ref 48–271)
IMMUNOGLOBULIN A: 620 mg/dL — AB (ref 81–463)

## 2017-03-06 LAB — MITOCHONDRIAL ANTIBODIES

## 2017-03-06 NOTE — Assessment & Plan Note (Signed)
Thorough evaluation in past with negative AMA, negative viral serologies, normal GGT. Fatty liver. Non-specific mild elevation noted with normal transaminases. Most recent imaging with concern for possible early cirrhosis but no other laboratory indices to suggest advanced liver disease. Updating serologies now and pursuing elastography. Low threshold for liver biopsy, and this was discussed with patient.

## 2017-03-06 NOTE — Assessment & Plan Note (Addendum)
Updated CT due to acute on chronic pain in setting of constipation was completed recently. This noted a probably jejunal-jejunal intussusception, felt to be transient. No obstructive signs. CTE completed to evaluate for small bowel abnormality, and this was normal with no evidence of intussusception. Would consider utilizing pediatric colonoscope at time of EGD to visual to proximal jejunum; however, CTE findings reassuring. Increase Amitiza to 24 mcg BID. Return in March for close follow-up.

## 2017-03-06 NOTE — Assessment & Plan Note (Signed)
57 year old female with history of chronic dysphagia, s/p multiple dilations and improvement in the past with dilation. Last EGD/dilation July 2017. Also evaluated by Speech and ENT in the past. BPE in Nov 2017 normal. As she has noted improvement historically, will proceed with updated EGD.  Proceed with upper endoscopy/dilation in the near future with Dr. Gala Romney. The risks, benefits, and alternatives have been discussed in detail with patient. They have stated understanding and desire to proceed.  Propofol due to polypharmacy See "abdominal pain" as patient may benefit from enteroscopy at time of EGD to evaluate as much of proximal small bowel as possible due to findings on CT.

## 2017-03-07 NOTE — Progress Notes (Signed)
cc'd to pcp 

## 2017-03-09 ENCOUNTER — Ambulatory Visit (HOSPITAL_COMMUNITY): Payer: Medicare Other

## 2017-03-10 ENCOUNTER — Ambulatory Visit (HOSPITAL_COMMUNITY)
Admission: RE | Admit: 2017-03-10 | Discharge: 2017-03-10 | Disposition: A | Discharge status: Home / Self Care | Payer: Medicare Other | Source: Ambulatory Visit | Attending: Gastroenterology | Admitting: Gastroenterology

## 2017-03-10 ENCOUNTER — Telehealth: Payer: Self-pay

## 2017-03-10 ENCOUNTER — Encounter (HOSPITAL_COMMUNITY): Payer: Self-pay

## 2017-03-10 ENCOUNTER — Encounter (HOSPITAL_COMMUNITY)
Admission: RE | Admit: 2017-03-10 | Discharge: 2017-03-10 | Disposition: A | Discharge status: Home / Self Care | Payer: Medicare Other | Source: Ambulatory Visit | Attending: Internal Medicine | Admitting: Internal Medicine

## 2017-03-10 DIAGNOSIS — R748 Abnormal levels of other serum enzymes: Secondary | ICD-10-CM

## 2017-03-10 NOTE — Telephone Encounter (Signed)
PA started and faxed to [employer] for Amitiza. Waiting on approval or denial.

## 2017-03-15 NOTE — Progress Notes (Signed)
Elastography with F0/F1. As suspected, I highly doubt she has any underlying advanced liver disease. AMA, ASMA continue to be negative. ANA is positive, and this is new (was negative several years ago). Pattern of ANA appears to be associated with SLE, drug-induced lupus, etc. Doesn't mean she has this. Her alk phos is only minimally elevated. No concern for CBD dilation. Transaminases have remained normal. GGT normal. If she really wants to pursue a liver biopsy to fully investigate this, we could arrange. Could possibly benefit from rheumatology referral. Keep plans for EGD 2/28.

## 2017-03-17 ENCOUNTER — Ambulatory Visit (HOSPITAL_COMMUNITY)
Admission: RE | Admit: 2017-03-17 | Discharge: 2017-03-17 | Disposition: A | Discharge status: Home / Self Care | Payer: Medicare Other | Source: Ambulatory Visit | Attending: Internal Medicine | Admitting: Internal Medicine

## 2017-03-17 ENCOUNTER — Other Ambulatory Visit: Payer: Self-pay

## 2017-03-17 ENCOUNTER — Encounter (HOSPITAL_COMMUNITY): Payer: Self-pay | Admitting: *Deleted

## 2017-03-17 ENCOUNTER — Ambulatory Visit (HOSPITAL_COMMUNITY): Payer: Medicare Other | Admitting: Anesthesiology

## 2017-03-17 ENCOUNTER — Encounter (HOSPITAL_COMMUNITY)
Admission: RE | Disposition: A | Discharge status: Home / Self Care | Payer: Self-pay | Source: Ambulatory Visit | Attending: Internal Medicine

## 2017-03-17 DIAGNOSIS — Z8249 Family history of ischemic heart disease and other diseases of the circulatory system: Secondary | ICD-10-CM | POA: Diagnosis not present

## 2017-03-17 DIAGNOSIS — F329 Major depressive disorder, single episode, unspecified: Secondary | ICD-10-CM | POA: Diagnosis not present

## 2017-03-17 DIAGNOSIS — R109 Unspecified abdominal pain: Secondary | ICD-10-CM | POA: Diagnosis not present

## 2017-03-17 DIAGNOSIS — Z85118 Personal history of other malignant neoplasm of bronchus and lung: Secondary | ICD-10-CM | POA: Diagnosis not present

## 2017-03-17 DIAGNOSIS — R1310 Dysphagia, unspecified: Secondary | ICD-10-CM | POA: Diagnosis not present

## 2017-03-17 DIAGNOSIS — Z79899 Other long term (current) drug therapy: Secondary | ICD-10-CM | POA: Insufficient documentation

## 2017-03-17 DIAGNOSIS — E785 Hyperlipidemia, unspecified: Secondary | ICD-10-CM | POA: Diagnosis not present

## 2017-03-17 DIAGNOSIS — Z888 Allergy status to other drugs, medicaments and biological substances status: Secondary | ICD-10-CM | POA: Diagnosis not present

## 2017-03-17 DIAGNOSIS — K219 Gastro-esophageal reflux disease without esophagitis: Secondary | ICD-10-CM | POA: Insufficient documentation

## 2017-03-17 DIAGNOSIS — K449 Diaphragmatic hernia without obstruction or gangrene: Secondary | ICD-10-CM | POA: Insufficient documentation

## 2017-03-17 DIAGNOSIS — J45909 Unspecified asthma, uncomplicated: Secondary | ICD-10-CM | POA: Diagnosis not present

## 2017-03-17 DIAGNOSIS — M1990 Unspecified osteoarthritis, unspecified site: Secondary | ICD-10-CM | POA: Diagnosis not present

## 2017-03-17 DIAGNOSIS — R002 Palpitations: Secondary | ICD-10-CM | POA: Insufficient documentation

## 2017-03-17 DIAGNOSIS — K222 Esophageal obstruction: Secondary | ICD-10-CM

## 2017-03-17 DIAGNOSIS — F419 Anxiety disorder, unspecified: Secondary | ICD-10-CM | POA: Insufficient documentation

## 2017-03-17 DIAGNOSIS — G4730 Sleep apnea, unspecified: Secondary | ICD-10-CM | POA: Diagnosis not present

## 2017-03-17 DIAGNOSIS — Z87891 Personal history of nicotine dependence: Secondary | ICD-10-CM | POA: Insufficient documentation

## 2017-03-17 DIAGNOSIS — I10 Essential (primary) hypertension: Secondary | ICD-10-CM | POA: Insufficient documentation

## 2017-03-17 DIAGNOSIS — J384 Edema of larynx: Secondary | ICD-10-CM | POA: Insufficient documentation

## 2017-03-17 DIAGNOSIS — K5909 Other constipation: Secondary | ICD-10-CM | POA: Diagnosis not present

## 2017-03-17 HISTORY — PX: MALONEY DILATION: SHX5535

## 2017-03-17 HISTORY — PX: ESOPHAGOGASTRODUODENOSCOPY (EGD) WITH PROPOFOL: SHX5813

## 2017-03-17 SURGERY — ESOPHAGOGASTRODUODENOSCOPY (EGD) WITH PROPOFOL
Anesthesia: Monitor Anesthesia Care

## 2017-03-17 MED ORDER — MIDAZOLAM HCL 2 MG/2ML IJ SOLN
INTRAMUSCULAR | Status: AC
  Filled 2017-03-17: qty 2

## 2017-03-17 MED ORDER — LACTATED RINGERS IV SOLN
INTRAVENOUS | Status: DC
  Administered 2017-03-17: 09:00:00 via INTRAVENOUS

## 2017-03-17 MED ORDER — MIDAZOLAM HCL 5 MG/5ML IJ SOLN
INTRAMUSCULAR | Status: DC | PRN
  Administered 2017-03-17: 2 mg via INTRAVENOUS

## 2017-03-17 MED ORDER — FENTANYL CITRATE (PF) 100 MCG/2ML IJ SOLN
INTRAMUSCULAR | Status: AC
  Filled 2017-03-17: qty 2

## 2017-03-17 MED ORDER — MIDAZOLAM HCL 2 MG/2ML IJ SOLN
1.0000 mg | INTRAMUSCULAR | Status: AC
  Administered 2017-03-17: 2 mg via INTRAVENOUS

## 2017-03-17 MED ORDER — LIDOCAINE VISCOUS 2 % MT SOLN
OROMUCOSAL | Status: AC
Start: 2017-03-17 — End: 2017-03-17
  Filled 2017-03-17: qty 15

## 2017-03-17 MED ORDER — PROPOFOL 10 MG/ML IV BOLUS
INTRAVENOUS | Status: AC
  Filled 2017-03-17: qty 20

## 2017-03-17 MED ORDER — LIDOCAINE VISCOUS 2 % MT SOLN
OROMUCOSAL | Status: DC | PRN
Start: 2017-03-17 — End: 2017-03-17
  Administered 2017-03-17: 1 via OROMUCOSAL

## 2017-03-17 MED ORDER — FENTANYL CITRATE (PF) 100 MCG/2ML IJ SOLN
25.0000 ug | Freq: Once | INTRAMUSCULAR | Status: AC
  Administered 2017-03-17: 25 ug via INTRAVENOUS

## 2017-03-17 MED ORDER — PROPOFOL 500 MG/50ML IV EMUL
INTRAVENOUS | Status: DC | PRN
  Administered 2017-03-17: 125 ug/kg/min via INTRAVENOUS
  Administered 2017-03-17: 10:00:00 via INTRAVENOUS

## 2017-03-17 NOTE — Discharge Instructions (Signed)
EGD Discharge instructions Please read the instructions outlined below and refer to this sheet in the next few weeks. These discharge instructions provide you with general information on caring for yourself after you leave the hospital. Your doctor may also give you specific instructions. While your treatment has been planned according to the most current medical practices available, unavoidable complications occasionally occur. If you have any problems or questions after discharge, please call your doctor. ACTIVITY  You may resume your regular activity but move at a slower pace for the next 24 hours.   Take frequent rest periods for the next 24 hours.   Walking will help expel (get rid of) the air and reduce the bloated feeling in your abdomen.   No driving for 24 hours (because of the anesthesia (medicine) used during the test).   You may shower.   Do not sign any important legal documents or operate any machinery for 24 hours (because of the anesthesia used during the test).  NUTRITION  Drink plenty of fluids.   You may resume your normal diet.   Begin with a light meal and progress to your normal diet.   Avoid alcoholic beverages for 24 hours or as instructed by your caregiver.  MEDICATIONS  You may resume your normal medications unless your caregiver tells you otherwise.  WHAT YOU CAN EXPECT TODAY  You may experience abdominal discomfort such as a feeling of fullness or gas pains.  FOLLOW-UP  Your doctor will discuss the results of your test with you.  SEEK IMMEDIATE MEDICAL ATTENTION IF ANY OF THE FOLLOWING OCCUR:  Excessive nausea (feeling sick to your stomach) and/or vomiting.   Severe abdominal pain and distention (swelling).   Trouble swallowing.   Temperature over 101 F (37.8 C).   Rectal bleeding or vomiting of blood.    Office visit with [place] in 3 months     PATIENT INSTRUCTIONS POST-ANESTHESIA  IMMEDIATELY FOLLOWING SURGERY:  Do not drive or  operate machinery for the first twenty four hours after surgery.  Do not make any important decisions for twenty four hours after surgery or while taking narcotic pain medications or sedatives.  If you develop intractable nausea and vomiting or a severe headache please notify your doctor immediately.  FOLLOW-UP:  Please make an appointment with your surgeon as instructed. You do not need to follow up with anesthesia unless specifically instructed to do so.  WOUND CARE INSTRUCTIONS (if applicable):  Keep a dry clean dressing on the anesthesia/puncture wound site if there is drainage.  Once the wound has quit draining you may leave it open to air.  Generally you should leave the bandage intact for twenty four hours unless there is drainage.  If the epidural site drains for more than 36-48 hours please call the anesthesia department.  QUESTIONS?:  Please feel free to call your physician or the hospital operator if you have any questions, and they will be happy to assist you.

## 2017-03-17 NOTE — Interval H&P Note (Signed)
History and Physical Interval Note:  03/17/2017 10:01 AM  [name]  has presented today for surgery, with the diagnosis of dysphagia, abdominal pain  The various methods of treatment have been discussed with the patient and family. After consideration of risks, benefits and other options for treatment, the patient has consented to  Procedure(s) with comments: ESOPHAGOGASTRODUODENOSCOPY (EGD) WITH PROPOFOL (N/A) - 10:00am MALONEY DILATION (N/A) as a surgical intervention .  The patient's history has been reviewed, patient examined, no change in status, stable for surgery.  I have reviewed the patient's chart and labs.  Questions were answered to the patient's satisfaction.     Robert Rourk  No change. EGD with possible esophageal dilation as feasible/appropriate. Will utilize pediatric colonoscope to take at look downstream small bowel.  The risks, benefits, limitations, alternatives and imponderables have been reviewed with the patient. Potential for esophageal dilation, biopsy, etc. have also been reviewed.  Questions have been answered. All parties agreeable.

## 2017-03-17 NOTE — Transfer of Care (Signed)
Immediate Anesthesia Transfer of Care Note  Patient: [name]  Procedure(s) Performed: ESOPHAGOGASTRODUODENOSCOPY (EGD) WITH PROPOFOL (N/A ) MALONEY DILATION (N/A )  Patient Location: PACU  Anesthesia Type:MAC  Level of Consciousness: drowsy  Airway & Oxygen Therapy: Patient Spontanous Breathing and Patient connected to face mask oxygen  Post-op Assessment: Report given to RN, Post -op Vital signs reviewed and stable and Patient moving all extremities  Post vital signs: Reviewed and stable  Last Vitals:  Vitals:   03/17/17 0950 03/17/17 0955  BP: 131/68 121/65  Pulse:    Resp: (!) 21 19  Temp:    SpO2: 100% 100%    Last Pain:  Vitals:   03/17/17 0821  TempSrc: Oral         Complications: No apparent anesthesia complications

## 2017-03-17 NOTE — Anesthesia Postprocedure Evaluation (Signed)
Anesthesia Post Note  Patient: [name]  Procedure(s) Performed: ESOPHAGOGASTRODUODENOSCOPY (EGD) WITH PROPOFOL (N/A ) MALONEY DILATION (N/A )  Patient location during evaluation: PACU Anesthesia Type: MAC Level of consciousness: awake and alert and patient cooperative Pain management: pain level controlled Vital Signs Assessment: post-procedure vital signs reviewed and stable Respiratory status: spontaneous breathing, nonlabored ventilation and respiratory function stable Cardiovascular status: blood pressure returned to baseline Postop Assessment: no apparent nausea or vomiting Anesthetic complications: no     Last Vitals:  Vitals:   03/17/17 1045 03/17/17 1100  BP: 126/70 121/68  Pulse: (!) 102 96  Resp: (!) 23 (!) 23  Temp:    SpO2: 100% 92%    Last Pain:  Vitals:   03/17/17 0821  TempSrc: Oral                 Sabree Nuon J

## 2017-03-17 NOTE — Op Note (Signed)
[facility], [place] Patient Name: [name] Procedure Date: 03/17/2017 9:49 AM MRN: [id number] Date of Birth: September 20, 1960 Attending MD: Norvel Richards , MD CSN: [id number] Age: 57 Admit Type: Outpatient Procedure:                Upper GI endoscopy Indications:              Dysphagia; recent jejunal intussusception noted on                            CT Providers:                Norvel Richards, MD, Janeece Riggers, RN, Nelma Rothman, Technician Referring MD:              Medicines:                Propofol per Anesthesia Complications:            No immediate complications. Estimated Blood Loss:     Estimated blood loss: none. Procedure:                Pre-Anesthesia Assessment:                           - Prior to the procedure, a History and Physical                            was performed, and patient medications and                            allergies were reviewed. The patient's tolerance of                            previous anesthesia was also reviewed. The risks                            and benefits of the procedure and the sedation                            options and risks were discussed with the patient.                            All questions were answered, and informed consent                            was obtained. Prior Anticoagulants: The patient has                            taken no previous anticoagulant or antiplatelet                            agents. ASA Grade Assessment: III - A patient with  severe systemic disease. After reviewing the risks                            and benefits, the patient was deemed in                            satisfactory condition to undergo the procedure.                           After obtaining informed consent, the endoscope was                            passed under direct vision. Throughout the                            procedure, the patient's blood pressure,  pulse, and                            oxygen saturations were monitored continuously. The                            [id number] was introduced through the and advanced                            to the proximal jejunum. The [id number] ([id number])                            scope was introduced through the and advanced to                            the. The upper GI endoscopy was accomplished                            without difficulty. The patient tolerated the                            procedure well. Scope In: 10:13:13 AM Scope Out: 10:26:55 AM Total Procedure Duration: 0 hours 13 minutes 42 seconds  Findings:      A mild Schatzki ring (acquired) was found at the gastroesophageal       junction. The scope was withdrawn. Dilation was performed with a Maloney       dilator with mild resistance at 24 Fr. a 56 French Maloney dilator was       subsequently passed with mild resistance. The dilation site was examined       following endoscope reinsertion and showed no change. Estimated blood       loss: none.      A small hiatal hernia was present.      The exam was otherwise without abnormality.      The second portion of the duodenum, third portion of the duodenum and       fourth portion of the duodenum were normal.      The examined proximaljejunum was normal. Impression:               - Mild Schatzki ring. Status post dilation.                           -  Small hiatal hernia.                           - The examination was otherwise normal.                           - Normal second portion of the duodenum, third                            portion of the duodenum and fourth portion of the                            duodenum.                           - Normal examined jejunum.                           - No specimens collected. Moderate Sedation:      Moderate (conscious) sedation was personally administered by an       anesthesia professional. The following parameters were monitored:  oxygen       saturation, heart rate, blood pressure, respiratory rate, EKG, adequacy       of pulmonary ventilation, and response to care. Total physician       intraservice time was 18 minutes. Recommendation:           - Patient has a contact number available for                            emergencies. The signs and symptoms of potential                            delayed complications were discussed with the                            patient. Return to normal activities tomorrow.                            Written discharge instructions were provided to the                            patient.                           - Advance diet as tolerated.                           - Continue present medications.                           - No repeat upper endoscopy.                           - Return to GI office in 3 months. Procedure Code(s):        --- Professional ---  95093, Esophagogastroduodenoscopy, flexible,                            transoral; diagnostic, including collection of                            specimen(s) by brushing or washing, when performed                            (separate procedure)                           43450, Dilation of esophagus, by unguided sound or                            bougie, single or multiple passes Diagnosis Code(s):        --- Professional ---                           K22.2, Esophageal obstruction                           K44.9, Diaphragmatic hernia without obstruction or                            gangrene                           R13.10, Dysphagia, unspecified CPT copyright 2016 American Medical Association. All rights reserved. The codes documented in this report are preliminary and upon coder review may  be revised to meet current compliance requirements. [name]. Arkin Imran, MD Norvel Richards, MD 03/17/2017 10:39:21 AM This report has been signed electronically. Number of Addenda: 0

## 2017-03-17 NOTE — Anesthesia Preprocedure Evaluation (Signed)
Anesthesia Evaluation  Patient identified by MRN, date of birth, ID band Patient awake    Reviewed: Allergy & Precautions, NPO status , Patient's Chart, lab work & pertinent test results  Airway Mallampati: III  TM Distance: >3 FB   Mouth opening: Limited Mouth Opening  Dental  (+) Edentulous Upper, Edentulous Lower,    Pulmonary shortness of breath, asthma , sleep apnea , former smoker,  L Lung CA - lobectomy    breath sounds clear to auscultation       Cardiovascular hypertension, Pt. on medications  Rhythm:Regular Rate:Normal     Neuro/Psych PSYCHIATRIC DISORDERS Anxiety Depression  Neuromuscular disease    GI/Hepatic GERD  ,  Endo/Other    Renal/GU      Musculoskeletal   Abdominal   Peds  Hematology   Anesthesia Other Findings   Reproductive/Obstetrics                             Anesthesia Physical Anesthesia Plan  ASA: III  Anesthesia Plan: MAC   Post-op Pain Management:    Induction: Intravenous  PONV Risk Score and Plan:   Airway Management Planned: Simple Face Mask  Additional Equipment:   Intra-op Plan:   Post-operative Plan:   Informed Consent: I have reviewed the patients History and Physical, chart, labs and discussed the procedure including the risks, benefits and alternatives for the proposed anesthesia with the patient or authorized representative who has indicated his/her understanding and acceptance.     Plan Discussed with:   Anesthesia Plan Comments:         Anesthesia Quick Evaluation

## 2017-03-21 ENCOUNTER — Telehealth: Payer: Self-pay | Admitting: Internal Medicine

## 2017-03-21 ENCOUNTER — Encounter (HOSPITAL_COMMUNITY): Payer: Self-pay | Admitting: Internal Medicine

## 2017-03-21 ENCOUNTER — Ambulatory Visit: Payer: Medicare Other | Admitting: Physical Therapy

## 2017-03-21 NOTE — Telephone Encounter (Signed)
Pt was notified of results

## 2017-03-21 NOTE — Telephone Encounter (Signed)
Pt said she had a CT done a few weeks ago and hasn't heard about her results from that. Please advise and call her [phone]

## 2017-03-22 ENCOUNTER — Telehealth: Payer: Self-pay

## 2017-03-22 NOTE — Telephone Encounter (Signed)
Spoke with pt and AB is going to call pt.

## 2017-03-22 NOTE — Telephone Encounter (Signed)
Pt called office to cancel appt 03/31/17 with AB. She said there is no need for OV and she has another appt that day. Just wants recent test results explained to her. Requests for nurse to call her back. [phone]

## 2017-03-23 ENCOUNTER — Ambulatory Visit: Payer: Medicare Other | Admitting: Gastroenterology

## 2017-03-23 NOTE — Telephone Encounter (Signed)
Attempted to call patient. Had to leave message.

## 2017-03-23 NOTE — Telephone Encounter (Signed)
Reviewed results with patient. I will see her in June. Declining further evaluation currently.

## 2017-03-24 ENCOUNTER — Ambulatory Visit (HOSPITAL_COMMUNITY)
Admission: RE | Admit: 2017-03-24 | Discharge: 2017-03-24 | Disposition: A | Discharge status: Home / Self Care | Payer: Medicare Other | Source: Ambulatory Visit | Attending: Specialist | Admitting: Specialist

## 2017-03-24 DIAGNOSIS — Z981 Arthrodesis status: Secondary | ICD-10-CM | POA: Diagnosis not present

## 2017-03-24 DIAGNOSIS — Z4889 Encounter for other specified surgical aftercare: Secondary | ICD-10-CM | POA: Diagnosis not present

## 2017-03-24 DIAGNOSIS — M5416 Radiculopathy, lumbar region: Secondary | ICD-10-CM | POA: Diagnosis not present

## 2017-03-24 DIAGNOSIS — M1288 Other specific arthropathies, not elsewhere classified, other specified site: Secondary | ICD-10-CM | POA: Diagnosis not present

## 2017-03-24 DIAGNOSIS — M5136 Other intervertebral disc degeneration, lumbar region: Secondary | ICD-10-CM | POA: Insufficient documentation

## 2017-03-24 MED ORDER — GADOBENATE DIMEGLUMINE 529 MG/ML IV SOLN
17.0000 mL | Freq: Once | INTRAVENOUS | Status: AC | PRN
  Administered 2017-03-24: 17 mL via INTRAVENOUS

## 2017-03-31 ENCOUNTER — Encounter (INDEPENDENT_AMBULATORY_CARE_PROVIDER_SITE_OTHER): Payer: Self-pay | Admitting: Specialist

## 2017-03-31 ENCOUNTER — Ambulatory Visit (INDEPENDENT_AMBULATORY_CARE_PROVIDER_SITE_OTHER): Payer: Medicare Other

## 2017-03-31 ENCOUNTER — Ambulatory Visit (INDEPENDENT_AMBULATORY_CARE_PROVIDER_SITE_OTHER): Payer: Medicare Other | Admitting: Specialist

## 2017-03-31 ENCOUNTER — Ambulatory Visit: Payer: Medicare Other | Admitting: Gastroenterology

## 2017-03-31 VITALS — BP 130/73 | HR 88 | Ht 65.0 in | Wt 182.0 lb

## 2017-03-31 DIAGNOSIS — M48062 Spinal stenosis, lumbar region with neurogenic claudication: Secondary | ICD-10-CM

## 2017-03-31 DIAGNOSIS — G8929 Other chronic pain: Secondary | ICD-10-CM | POA: Diagnosis not present

## 2017-03-31 DIAGNOSIS — M47896 Other spondylosis, lumbar region: Secondary | ICD-10-CM

## 2017-03-31 DIAGNOSIS — M25562 Pain in left knee: Secondary | ICD-10-CM | POA: Diagnosis not present

## 2017-03-31 MED ORDER — MELOXICAM 15 MG PO TABS: 15.0000 mg | ORAL_TABLET | Freq: Every day | ORAL | 2 refills | Status: DC

## 2017-03-31 MED ORDER — BACLOFEN 10 MG PO TABS
10.0000 mg | ORAL_TABLET | Freq: Three times a day (TID) | ORAL | 0 refills | Status: DC
Start: 2017-03-31 — End: 2017-08-18

## 2017-03-31 NOTE — Progress Notes (Signed)
Office Visit Note   Patient: [name]           Date of Birth: 1960/05/21           MRN: [id number] Visit Date: 03/31/2017              Requested by: Verner Mould, MD [address] [place], [place] [address] PCP: Verner Mould, MD   Assessment & Plan: Visit Diagnoses:  1. Chronic pain of left knee   2. Other spondylosis, lumbar region   3. Spinal stenosis of lumbar region with neurogenic claudication     Plan: The main ways of treat osteoarthritis, that are found to be success. Weight loss helps to decrease pain. Exercise is important to maintaining cartilage and thickness and strengthening. NSAIDs like meloxicam, tylenol are meds for decreasing the inflamation. Ice is okay  In afternoon and evening and hot shower in the am  Follow-Up Instructions: Return in about 2 months (around 05/31/2017).   Orders:  Orders Placed This Encounter  Procedures  . XR KNEE 3 VIEW LEFT  . Ambulatory referral to Physical Therapy   Meds ordered this encounter  Medications  . meloxicam (MOBIC) 15 MG tablet    Sig: Take 1 tablet (15 mg total) by mouth daily.    Dispense:  30 tablet    Refill:  2  . baclofen (LIORESAL) 10 MG tablet    Sig: Take 1 tablet (10 mg total) by mouth 3 (three) times daily.    Dispense:  30 each    Refill:  0      Procedures: No procedures performed   Clinical Data: No additional findings.   Subjective: Chief Complaint  Patient presents with  . MRI review    57 year old female with history of coccygodynia, and L3-4 spondylolisthesis. Post removal of a benign mass from the para coccygeal area and an L3-4 TLIF. She is not inclined to consider a cortisone injection in the back and in the knee and is not wanting to undergo PT because it hurts. No bowel or bladder difficulty. SHe has trouble walking any distance. Feels better sitting, when she first gets up it takes a momemt to regain her composure to be able to walk or stand in  one Place. Standing up the left hip wants to turn in and she has to stand on it before trying to get up. She is using a shoe horn and a sock applicator, it hurts really bad With bending and stooping.     Review of Systems  Constitutional: Positive for diaphoresis and fever. Negative for activity change, appetite change, chills, fatigue and unexpected weight change.  HENT: Negative for congestion, dental problem, drooling, ear discharge, ear pain, facial swelling, hearing loss, mouth sores, nosebleeds, postnasal drip, rhinorrhea, sinus pressure, sinus pain, sneezing, sore throat, tinnitus, trouble swallowing and voice change.   Eyes: Negative for photophobia, pain, discharge, redness, itching and visual disturbance.  Respiratory: Positive for shortness of breath. Negative for apnea, cough, choking, chest tightness, wheezing and stridor.   Cardiovascular: Negative for chest pain, palpitations and leg swelling.  Gastrointestinal: Negative for abdominal distention, abdominal pain, anal bleeding, blood in stool and constipation.  Endocrine: Negative for cold intolerance, heat intolerance, polydipsia, polyphagia and polyuria.  Genitourinary: Negative for difficulty urinating, dyspareunia, dysuria, enuresis, flank pain, frequency, genital sores and hematuria.  Musculoskeletal: Positive for back pain and joint swelling. Negative for arthralgias, gait problem, neck pain and neck stiffness.  Skin: Negative for color  change, pallor, rash and wound.  Allergic/Immunologic: Negative for environmental allergies, food allergies and immunocompromised state.  Neurological: Positive for dizziness, weakness, light-headedness, numbness and headaches. Negative for tremors, seizures, syncope, facial asymmetry and speech difficulty.  Hematological: Negative for adenopathy. Does not bruise/bleed easily.  Psychiatric/Behavioral: Positive for sleep disturbance. Negative for agitation, behavioral problems, confusion,  decreased concentration, dysphoric mood, hallucinations, self-injury and suicidal ideas. The patient is nervous/anxious. The patient is not hyperactive.      Objective: Vital Signs: BP 130/73   Pulse 88   Ht 5\' 5"  (1.651 m)   Wt 182 lb (82.6 kg)   BMI 30.29 kg/m   Physical Exam  Constitutional: She is oriented to person, place, and time. She appears well-developed and well-nourished.  HENT:  Head: Normocephalic and atraumatic.  Eyes: EOM are normal. Pupils are equal, round, and reactive to light.  Neck: Normal range of motion. Neck supple.  Pulmonary/Chest: Effort normal and breath sounds normal.  Abdominal: Soft. Bowel sounds are normal.  Musculoskeletal: Normal range of motion.       Left knee: She exhibits no effusion.  Neurological: She is alert and oriented to person, place, and time.  Skin: Skin is warm and dry.  Psychiatric: She has a normal mood and affect. Her behavior is normal. Judgment and thought content normal.    Left Knee Exam  Left knee exam is normal.  Tenderness  The patient is experiencing tenderness in the medial joint line and patella.  Range of Motion  Extension: normal  Flexion: 130   Tests  McMurray:  Medial - negative Lateral - negative Varus: negative Valgus: negative Lachman:  Anterior - negative    Posterior - negative Drawer:  Anterior - negative     Posterior - negative Pivot shift: negative Patellar apprehension: negative  Other  Erythema: absent Scars: absent Sensation: normal Pulse: present Swelling: none Effusion: no effusion present   Back Exam   Tenderness  The patient is experiencing tenderness in the lumbar.  Range of Motion  Extension: normal  Flexion: normal  Lateral bend right: normal  Lateral bend left: normal  Rotation right: normal  Rotation left: normal   Muscle Strength  Right Quadriceps:  5/5  Left Quadriceps:  5/5  Right Hamstrings:  5/5  Left Hamstrings:  5/5   Tests  Straight leg raise right:  negative Straight leg raise left: negative  Reflexes  Patellar: normal Achilles: normal Biceps: normal Babinski's sign: normal   Other  Toe walk: normal Heel walk: normal Sensation: normal Gait: short leg  Erythema: no back redness Scars: absent      Specialty Comments:  No specialty comments available.  Imaging: Xr Knee 3 View Left  Result Date: 03/31/2017 [facility] standing bilateral and lateral left knee and tangetial both knees show scalloping of the retropatella surface due to erosion, tangential without subluxation, patella alta left knee and Minimal medial joint line narrowing bilaterally. There are osteophytes off the superior and inferior pole of the left patella    PMFS History: Patient Active Problem List   Diagnosis Date Noted  . Mass of left forearm 10/10/2015    Priority: High    Class: Chronic  . Carpal tunnel syndrome, right 07/11/2015    Priority: High    Class: Chronic  . Spondylolisthesis of lumbar region 01/03/2015    Priority: High    Class: Chronic  . Spinal stenosis, lumbar region, with neurogenic claudication 01/03/2015    Priority: High    Class: Chronic  . Soft  tissue mass 07/23/2014    Priority: High    Class: Acute  . Dysphagia 01/20/2017  . Left hip pain 01/19/2017  . Right hand pain 01/19/2017  . Diarrhea 10/11/2016  . Right hip pain 06/07/2016  . Dysuria 04/14/2016  . Malaise and fatigue 04/14/2016  . Breast pain, left 11/06/2015  . Dysphagia, oropharyngeal   . Gastroesophageal reflux disease without esophagitis   . Prediabetes 07/09/2015  . Lumbar stenosis with neurogenic claudication 01/05/2015  . Coccygodynia 07/23/2014    Class: Acute  . Healthcare maintenance 03/19/2014  . Constipation 02/15/2014  . Dysphagia, pharyngoesophageal phase   . Elevated alkaline phosphatase level 11/06/2013  . Abdominal pain 10/03/2013  . Obesity, unspecified 02/15/2013  . Genital herpes 10/12/2012  . HSV (herpes simplex virus) anogenital  infection 09/29/2012  . UNSPECIFIED SLEEP APNEA 12/04/2008  . ADENOCARCINOMA, LUNG 10/09/2008  . Osteoarthritis of multiple joints 07/13/2006  . HYPERCHOLESTEROLEMIA 03/17/2006  . DEPRESSION, MAJOR, RECURRENT 03/17/2006  . HYPERTENSION, BENIGN SYSTEMIC 03/17/2006  . GASTROESOPHAGEAL REFLUX, NO ESOPHAGITIS 03/17/2006  . INSOMNIA NOS 03/17/2006   Past Medical History:  Diagnosis Date  . Anxiety   . Asthma    every now and then.  Wheezing and coughing  . Cancer ([place]) 1997   left lung  . Chronic abdominal pain   . Chronic back pain   . Chronic joint pain   . Depression   . GERD (gastroesophageal reflux disease)   . HLD (hyperlipidemia)   . HTN (hypertension)   . Nausea and vomiting    chronic, recurrent  . Osteoarthrosis involving more than one site but not generalized   . Palpitations 01/2013   chronic, intermittent  . Shortness of breath dyspnea    with exertion    Family History  Problem Relation Age of Onset  . Diabetes Other   . Heart disease Other        has Psychologist, forensic  . Hypertension Mother   . Kidney disease Mother   . Hypertension Father   . Kidney disease Father   . Heart disease Father   . Cancer Father        leukemia  . Hypertension Sister   . Hypertension Sister   . Colon cancer Neg Hx     Past Surgical History:  Procedure Laterality Date  . ABDOMINAL HYSTERECTOMY    . ABDOMINAL SURGERY     ovarian cyst removal  . BIOPSY N/A 11/22/2013   Procedure: GASTRIC BIOPSY;  Surgeon: Daneil Dolin, MD;  Location: [facility];  Service: Endoscopy;  Laterality: N/A;  . BLADDER SURGERY  suspension x4   x 4  . CARPAL TUNNEL RELEASE Right 07/11/2015   Procedure: RIGHT CARPAL TUNNEL RELEASE;  Surgeon: Jessy Oto, MD;  Location: [facility];  Service: Orthopedics;  Laterality: Right;  . CHOLECYSTECTOMY N/A 04/05/2014   Procedure: LAPAROSCOPIC CHOLECYSTECTOMY;  Surgeon: Aviva Signs Md, MD;  Location: [facility];  Service: General;  Laterality: N/A;  .  COCCYGECTOMY N/A 04/11/2015   Procedure: COCCYGECTOMY WITH OSTEOTOMY THROUGH AREA OF DEFORMITY;  Surgeon: Jessy Oto, MD;  Location: [facility];  Service: Orthopedics;  Laterality: N/A;  . COLONOSCOPY    . COLONOSCOPY WITH PROPOFOL N/A 11/22/2013   Dr. Gala Romney: Grade 3 and 4/internal hemorrhoids?"likely source of hematochezia Normal colonoscopy otherwise.   . ESOPHAGOGASTRODUODENOSCOPY (EGD) WITH PROPOFOL N/A 11/22/2013   Dr. Gala Romney: Incomplete Schatzki's ring dilated and disrupted as described above.  Small hiatal hernia. Focally abnormal gastric mucosa of uncertain significance status  post biopsy, reactive gastropathy, negative H.pylori  . ESOPHAGOGASTRODUODENOSCOPY (EGD) WITH PROPOFOL N/A 08/04/2015   Dr. Gala Romney: mild Schatzki's ring s/p dilation, small hiatal hernia   . ESOPHAGOGASTRODUODENOSCOPY (EGD) WITH PROPOFOL N/A 03/17/2017   Procedure: ESOPHAGOGASTRODUODENOSCOPY (EGD) WITH PROPOFOL;  Surgeon: Daneil Dolin, MD;  [facility];  Service: Endoscopy;  Laterality: N/A;  10:00am  . EXCISION VAGINAL CYST Left 06/20/2012   Procedure: EXCISION LEFT LABIAL CYST;  Surgeon: Jonnie Kind, MD;  Location: [facility];  Service: Gynecology;  Laterality: Left;  . LOBECTOMY Left   . LUNG CANCER SURGERY  1997   age 11, resection only  . MALONEY DILATION N/A 11/22/2013   Procedure: [name] DILATION;  Surgeon: Daneil Dolin, MD;  Location: [facility];  Service: Endoscopy;  Laterality: N/A;  56  . MALONEY DILATION N/A 08/04/2015   Procedure: [name] DILATION;  Surgeon: Daneil Dolin, MD;  [facility];  Service: Endoscopy;  Laterality: N/A;  [name] DILATION N/A 03/17/2017   Procedure: [name] DILATION;  Surgeon: Daneil Dolin, MD;  [facility];  Service: Endoscopy;  Laterality: N/A;  . MASS EXCISION N/A 07/23/2014   Procedure: EXCISIONAL BIOPSY NODULE LEFT PARACOCCYGEAL AREA;  Surgeon: Jessy Oto, MD;  Location: [facility];  Service: Orthopedics;  Laterality: N/A;  . MASS EXCISION  Left 10/10/2015   Procedure: EXCISIONAL BIOPSY OF LEFT DORSORADIAL FOREARM MASS LIPOMA;  Surgeon: Jessy Oto, MD;  Location: [facility];  Service: Orthopedics;  Laterality: Left;  . TUBAL LIGATION     Social History   Occupational History  . Not on file  Tobacco Use  . Smoking status: Former Smoker    Packs/day: 1.50    Years: 25.00    Pack years: 37.50    Types: Cigarettes    Last attempt to quit: 04/07/1995    Years since quitting: 21.9  . Smokeless tobacco: Never Used  Substance and Sexual Activity  . Alcohol use: No  . Drug use: No  . Sexual activity: Not Currently    Birth control/protection: Surgical

## 2017-03-31 NOTE — Patient Instructions (Addendum)
The main ways of treat osteoarthritis, that are found to be success. Weight loss helps to decrease pain. Exercise is important to maintaining cartilage and thickness and strengthening. NSAIDs like meloxicam are meds for decreasing the inflamation. Ice is okay  In afternoon and evening and hot shower in the am

## 2017-04-07 ENCOUNTER — Ambulatory Visit (HOSPITAL_COMMUNITY): Payer: Medicare Other | Attending: Specialist | Admitting: Physical Therapy

## 2017-04-07 ENCOUNTER — Encounter (HOSPITAL_COMMUNITY): Payer: Self-pay | Admitting: Physical Therapy

## 2017-04-07 ENCOUNTER — Other Ambulatory Visit: Payer: Self-pay

## 2017-04-07 DIAGNOSIS — R262 Difficulty in walking, not elsewhere classified: Secondary | ICD-10-CM | POA: Insufficient documentation

## 2017-04-07 DIAGNOSIS — M5416 Radiculopathy, lumbar region: Secondary | ICD-10-CM

## 2017-04-07 DIAGNOSIS — M6281 Muscle weakness (generalized): Secondary | ICD-10-CM | POA: Diagnosis not present

## 2017-04-07 NOTE — Therapy (Signed)
[place], [place], [address] Phone: [phone]   Fax:  [phone]  Physical Therapy Evaluation  Patient Details  Name: [name] MRN: [id number] Date of Birth: Mar 21, 1960 Referring Provider: Basil Dess   Encounter Date: 04/07/2017  PT End of Session - 04/07/17 1516    Visit Number  1    Number of Visits  12    Date for PT Re-Evaluation  05/19/17 mini reassess on 04/28/2017    Authorization - Visit Number  1    Authorization - Number of Visits  12    PT Start Time  4163    PT Stop Time  1430    PT Time Calculation (min)  45 min    Activity Tolerance  Patient tolerated treatment well    Behavior During Therapy  [name] for tasks assessed/performed       Past Medical History:  Diagnosis Date  . Anxiety   . Asthma    every now and then.  Wheezing and coughing  . Cancer ([place]) 1997   left lung  . Chronic abdominal pain   . Chronic back pain   . Chronic joint pain   . Depression   . GERD (gastroesophageal reflux disease)   . HLD (hyperlipidemia)   . HTN (hypertension)   . Nausea and vomiting    chronic, recurrent  . Osteoarthrosis involving more than one site but not generalized   . Palpitations 01/2013   chronic, intermittent  . Shortness of breath dyspnea    with exertion    Past Surgical History:  Procedure Laterality Date  . ABDOMINAL HYSTERECTOMY    . ABDOMINAL SURGERY     ovarian cyst removal  . BIOPSY N/A 11/22/2013   Procedure: GASTRIC BIOPSY;  Surgeon: Daneil Dolin, MD;  Location: [facility];  Service: Endoscopy;  Laterality: N/A;  . BLADDER SURGERY  suspension x4   x 4  . CARPAL TUNNEL RELEASE Right 07/11/2015   Procedure: RIGHT CARPAL TUNNEL RELEASE;  Surgeon: Jessy Oto, MD;  Location: [facility];  Service: Orthopedics;  Laterality: Right;  . CHOLECYSTECTOMY N/A 04/05/2014   Procedure: LAPAROSCOPIC CHOLECYSTECTOMY;  Surgeon: Aviva Signs Md, MD;  Location: [facility];   Service: General;  Laterality: N/A;  . COCCYGECTOMY N/A 04/11/2015   Procedure: COCCYGECTOMY WITH OSTEOTOMY THROUGH AREA OF DEFORMITY;  Surgeon: Jessy Oto, MD;  Location: [facility];  Service: Orthopedics;  Laterality: N/A;  . COLONOSCOPY    . COLONOSCOPY WITH PROPOFOL N/A 11/22/2013   Dr. Gala Romney: Grade 3 and 4/internal hemorrhoids?"likely source of hematochezia Normal colonoscopy otherwise.   . ESOPHAGOGASTRODUODENOSCOPY (EGD) WITH PROPOFOL N/A 11/22/2013   Dr. Gala Romney: Incomplete Schatzki's ring dilated and disrupted as described above.  Small hiatal hernia. Focally abnormal gastric mucosa of uncertain significance status post biopsy, reactive gastropathy, negative H.pylori  . ESOPHAGOGASTRODUODENOSCOPY (EGD) WITH PROPOFOL N/A 08/04/2015   Dr. Gala Romney: mild Schatzki's ring s/p dilation, small hiatal hernia   . ESOPHAGOGASTRODUODENOSCOPY (EGD) WITH PROPOFOL N/A 03/17/2017   Procedure: ESOPHAGOGASTRODUODENOSCOPY (EGD) WITH PROPOFOL;  Surgeon: Daneil Dolin, MD;  [facility];  Service: Endoscopy;  Laterality: N/A;  10:00am  . EXCISION VAGINAL CYST Left 06/20/2012   Procedure: EXCISION LEFT LABIAL CYST;  Surgeon: Jonnie Kind, MD;  Location: [facility];  Service: Gynecology;  Laterality: Left;  . LOBECTOMY Left   . LUNG CANCER SURGERY  1997   age 32, resection only  . MALONEY DILATION N/A 11/22/2013  Procedure: MALONEY DILATION;  Surgeon: Daneil Dolin, MD;  [facility]: [facility];  Service: Endoscopy;  Laterality: N/A;  56  . MALONEY DILATION N/A 08/04/2015   Procedure: [name] DILATION;  Surgeon: Daneil Dolin, MD;  [facility];  Service: Endoscopy;  Laterality: N/A;  [name] DILATION N/A 03/17/2017   Procedure: [name] DILATION;  Surgeon: Daneil Dolin, MD;  [facility];  Service: Endoscopy;  Laterality: N/A;  . MASS EXCISION N/A 07/23/2014   Procedure: EXCISIONAL BIOPSY NODULE LEFT PARACOCCYGEAL AREA;  Surgeon: Jessy Oto, MD;  Location: [facility];  Service: Orthopedics;   Laterality: N/A;  . MASS EXCISION Left 10/10/2015   Procedure: EXCISIONAL BIOPSY OF LEFT DORSORADIAL FOREARM MASS LIPOMA;  Surgeon: Jessy Oto, MD;  Location: [facility];  Service: Orthopedics;  Laterality: Left;  . TUBAL LIGATION      There were no vitals filed for this visit.   Subjective Assessment - 04/07/17 1401    Subjective  [name] states that she has had low back pain for years.  She had a fusion on L3-4 in 2016 and did well for awhile but she has had noted increased back pain since Janurary.  She is having pain going down her left leg to her toes which is interfering with her sleep.  She is now being referred to skilled PT .     Pertinent History  Lt knee pain     How long can you sit comfortably?  10 minutes     How long can you stand comfortably?  5-10 minutes     How long can you walk comfortably?  less than five minutes     Patient Stated Goals  less pain, sit, stand and walk better; sleep better. Currently waking 6 x a night     Currently in Pain?  Yes    Pain Score  7  worst 10/10; best 0/10    Pain Location  Back    Pain Orientation  Left;Lower    Pain Descriptors / Indicators  Aching;Burning;Sharp    Pain Type  Chronic pain    Pain Onset  More than a month ago    Pain Frequency  Intermittent    Aggravating Factors   standing or walking     Pain Relieving Factors  nothing     Effect of Pain on Daily Activities  limits          [facility] PT Assessment - 04/07/17 0001      Assessment   Medical Diagnosis  Lumbar radiculopathy Lt     Referring Provider  Basil Dess    Onset Date/Surgical Date  02/14/17    Next MD Visit  05/18/2017    Prior Therapy  none      Precautions   Precautions  None      Restrictions   Weight Bearing Restrictions  No      Balance Screen   Has the patient fallen in the past 6 months  No    Has the patient had a decrease in activity level because of a fear of falling?   Yes    Is the patient reluctant to leave their home  because of a fear of falling?   No      Home Film/video editor residence      Prior Function   Level of Independence  Independent    Vocation  On disability    Leisure  crochet, wood working       Charity fundraiser Status  Within Functional Limits for tasks assessed      Observation/Other Assessments   Focus on Therapeutic Outcomes (FOTO)   40      Functional Tests   Functional tests  Single leg stance;Sit to Stand      Single Leg Stance   Comments  Lt: 3; RT 2      Sit to Stand   Comments  5 x 39.91      ROM / Strength   AROM / PROM / Strength  Strength      Strength   Strength Assessment Site  Hip;Knee;Ankle    Right/Left Hip  Right;Left    Right Hip Flexion  3/5    Right Hip Extension  3/5    Right Hip ABduction  4/5    Left Hip Flexion  3-/5    Left Hip Extension  2+/5    Left Hip ABduction  3/5    Right/Left Knee  Right;Left    Right Knee Flexion  3+/5    Right Knee Extension  4/5    Left Knee Flexion  3+/5    Left Knee Extension  4/5    Right/Left Ankle  Right;Left    Right Ankle Dorsiflexion  4+/5    Left Ankle Dorsiflexion  4/5      Ambulation/Gait   Ambulation Distance (Feet)  260 Feet    Gait Comments  3'             Objective measurements completed on examination: See above findings.      [facility] Adult PT Treatment/Exercise - 04/07/17 0001      Exercises   Exercises  Lumbar      Lumbar Exercises: Supine   Ab Set  10 reps    Bent Knee Raise  10 reps    Bridge  5 reps             PT Education - 04/07/17 1515    Education provided  Yes    Education Details  HEP    Person(s) Educated  Patient    Methods  Explanation;Handout    Comprehension  Verbalized understanding;Returned demonstration       PT Short Term Goals - 04/07/17 1611      PT SHORT TERM GOAL #1   Title  PT to be able to tolerate sitting for 30 minutes for increased comfort while eating meals.    Time  3    Period  Weeks     Status  New    Target Date  04/28/17      PT SHORT TERM GOAL #2   Title  PT to be able to walk for 20 minutes in order to complete household duties.     Time  3    Period  Weeks    Status  New      PT SHORT TERM GOAL #3   Title  PT to be able to stand for 20 minutes without inceased low back pain to be able to complete the dishes without having to sit down.     Time  3    Period  Weeks    Status  New      PT SHORT TERM GOAL #4   Title  PT low back pain to be no greater than a 7/10 to allow pt to sleep without waking greater than four times a night     Time  3    Period  Weeks    Status  New      PT SHORT TERM GOAL #5   Title  PT to demonstrate good body mechanics for lifting to decrease stress off pt low back area.     Time  3    Period  Weeks    Status  New        PT Long Term Goals - 04/07/17 1615      PT LONG TERM GOAL #1   Title  PT To be able to sit for 45 minutes without significant increased back pain to allow pt to travel     Time  6    Period  Weeks    Status  New    Target Date  05/19/17      PT LONG TERM GOAL #2   Title  Pt to be able to walk for 40 mintues without significant increased back pain to allow pt to complete shopping     Time  6    Period  Weeks    Status  New      PT LONG TERM GOAL #3   Title  Pt LE and core mm strength to be increased to allow pt to pick items off the floor and return to an upright position without needing the use of UE support.     Time  6    Period  Weeks    Status  New      PT LONG TERM GOAL #4   Title  PT low back pain level to be no greater tahn a 5/10 to allow pt to sleep waking up no more than 2 times a night due to pain.l     Time  6    Period  Weeks      PT LONG TERM GOAL #5   Title  PT to have no radicular sx to demonstrate decreased nerve root irritation     Time  6    Period  Weeks    Status  New             Plan - 04/07/17 1519    Clinical Impression Statement  [name] is a 57 yo female  who has chronic low back pain.  She Had a fusion of L3-4 in 2016 and did relatively well until this January when she began having low back pain that radiated into her left foot.  She denies and injury or trauma.  She has been referred to skilled physical therapy by Dr. Basil Dess.  Evaluation demonstrates decreased core and LE musculature strength, decreased balance, decreased activity tolerance and increased pain.  [name] will benefit from skilled physical therapy for a stabilization program to address the above issues and maximize her functional ability.     History and Personal Factors relevant to plan of care:  OA, HTN, lumbar fusion,    Clinical Presentation  Stable    Clinical Decision Making  Moderate    Rehab Potential  Good    PT Frequency  2x / week    PT Duration  6 weeks    PT Treatment/Interventions  Therapeutic exercise;Balance training;Functional mobility training;Therapeutic activities;Patient/family education;Dry needling    PT Next Visit Plan  Progress stabilization with heelslides, SLR, hip abduction; heel raises and functional squats.  Progress to prone, quadriped and standing educate in body mechanics.      PT Home Exercise Plan  eval given: ab set; bridge, bent knee raise and  clam     Consulted and Agree with Plan of Care  Patient       Patient will benefit from skilled therapeutic intervention in order to improve the following deficits and impairments:  Abnormal gait, Decreased activity tolerance, Decreased balance, Decreased strength, Difficulty walking, Postural dysfunction, Pain  Visit Diagnosis: Radiculopathy, lumbar region - Plan: PT plan of care cert/re-cert  Muscle weakness (generalized) - Plan: PT plan of care cert/re-cert  Difficulty in walking, not elsewhere classified - Plan: PT plan of care cert/re-cert     Problem List Patient Active Problem List   Diagnosis Date Noted  . Dysphagia 01/20/2017  . Left hip pain 01/19/2017  . Right hand pain  01/19/2017  . Diarrhea 10/11/2016  . Right hip pain 06/07/2016  . Dysuria 04/14/2016  . Malaise and fatigue 04/14/2016  . Breast pain, left 11/06/2015  . Mass of left forearm 10/10/2015    Class: Chronic  . Dysphagia, oropharyngeal   . Gastroesophageal reflux disease without esophagitis   . Carpal tunnel syndrome, right 07/11/2015    Class: Chronic  . Prediabetes 07/09/2015  . Lumbar stenosis with neurogenic claudication 01/05/2015  . Spondylolisthesis of lumbar region 01/03/2015    Class: Chronic  . Spinal stenosis, lumbar region, with neurogenic claudication 01/03/2015    Class: Chronic  . Soft tissue mass 07/23/2014    Class: Acute  . Coccygodynia 07/23/2014    Class: Acute  . Healthcare maintenance 03/19/2014  . Constipation 02/15/2014  . Dysphagia, pharyngoesophageal phase   . Elevated alkaline phosphatase level 11/06/2013  . Abdominal pain 10/03/2013  . Obesity, unspecified 02/15/2013  . Genital herpes 10/12/2012  . HSV (herpes simplex virus) anogenital infection 09/29/2012  . UNSPECIFIED SLEEP APNEA 12/04/2008  . ADENOCARCINOMA, LUNG 10/09/2008  . Osteoarthritis of multiple joints 07/13/2006  . HYPERCHOLESTEROLEMIA 03/17/2006  . DEPRESSION, MAJOR, RECURRENT 03/17/2006  . HYPERTENSION, BENIGN SYSTEMIC 03/17/2006  . GASTROESOPHAGEAL REFLUX, NO ESOPHAGITIS 03/17/2006  . INSOMNIA NOS 03/17/2006    [name], PT CLT [phone] 04/07/2017, 4:23 PM  [facility] [address] [place], [place], [address] Phone: [phone]   Fax:  [phone]  Name: [name] MRN: [id number] Date of Birth: [birth year]

## 2017-04-07 NOTE — Patient Instructions (Addendum)
Isometric Abdominal    Lying on back with knees bent, tighten stomach by pressing elbows down. Hold ___5_ seconds. Repeat _10___ times per set. Do __1__ sets per session. Do ___2_ sessions per day.  http://orth.exer.us/1086   Copyright  VHI. All rights reserved.  Bent Leg Lift (Hook-Lying)    Tighten stomach and slowly raise right leg __3__ inches from floor. Keep trunk rigid. Hold __2__ seconds.  Repeat to left  Repeat _10___ times per set. Do __1__ sets per session. Do __2__ sessions per day. 10 http://orth.exer.us/1090   Copyright  VHI. All rights reserved.  Bridging    Slowly raise buttocks from floor, keeping stomach tight. Repeat _10___ times per set. Do __1__ sets per session. Do _2___ sessions per day.  http://orth.exer.us/1096   Copyright  VHI. All rights reserved.  Hip Abduction / Adduction: with Knee Flexion (Supine)   Keep stomach tight.  Do not move your back  With right knee bent, gently lower knee to side and return. Repeat _10___ times per set. Do _1___ sets per session. Do _2___ sessions per day. Repeat to left side  http://orth.exer.us/682   Copyright  VHI. All rights reserved.

## 2017-04-08 ENCOUNTER — Telehealth (HOSPITAL_COMMUNITY): Payer: Self-pay | Admitting: Physical Therapy

## 2017-04-08 NOTE — Telephone Encounter (Signed)
PHYSICAL THERAPY DISCHARGE SUMMARY  Visits from Start of Care: 1  Current functional level related to goals / functional outcomes: See evaluation   Remaining deficits: all   Education / Equipment: HEP Plan: Patient agrees to discharge.  Patient goals were not met. Patient is being discharged due to the patient's request.  ?????        PT states that she is not able to afford therapy.  [name], [place] CLT [phone]

## 2017-04-11 ENCOUNTER — Encounter (HOSPITAL_COMMUNITY): Payer: Medicare Other | Admitting: Physical Therapy

## 2017-04-19 ENCOUNTER — Other Ambulatory Visit: Payer: Self-pay | Admitting: *Deleted

## 2017-04-19 DIAGNOSIS — M48062 Spinal stenosis, lumbar region with neurogenic claudication: Secondary | ICD-10-CM

## 2017-04-19 MED ORDER — GABAPENTIN 300 MG PO CAPS: 300.0000 mg | ORAL_CAPSULE | Freq: Three times a day (TID) | ORAL | 11 refills | Status: DC

## 2017-04-19 MED ORDER — DULOXETINE HCL 60 MG PO CPEP: 60.0000 mg | ORAL_CAPSULE | Freq: Two times a day (BID) | ORAL | 11 refills | Status: DC

## 2017-04-20 ENCOUNTER — Encounter (HOSPITAL_COMMUNITY): Payer: Medicare Other | Admitting: Physical Therapy

## 2017-04-26 ENCOUNTER — Encounter (HOSPITAL_COMMUNITY): Payer: Medicare Other | Admitting: Physical Therapy

## 2017-04-28 ENCOUNTER — Encounter (HOSPITAL_COMMUNITY): Payer: Medicare Other | Admitting: Physical Therapy

## 2017-05-02 ENCOUNTER — Telehealth: Payer: Self-pay | Admitting: Internal Medicine

## 2017-05-02 NOTE — Telephone Encounter (Signed)
Pt called this morning to say she had a procedure recently and her tooth was knocked loose and she doesn't have the money to have it pulled. She said her gums are swollen and needs something for the pain. Procedure was done in Feb. [phone]

## 2017-05-02 NOTE — Telephone Encounter (Signed)
AB, I spoke with pt and she said her esophagus was stretched 1-2 weeks ago and her tooth was knocked loose. I didn't see a procedure done 1-2 weeks ago, only 03/17/17. She said she has tried to deal with the pain and her face is swollen. Swelling has been present x 3 days and she is asking for pain medication. Pt states that she can't afford to have her tooth fixed.

## 2017-05-02 NOTE — Telephone Encounter (Signed)
Pt notified and is aware that she should seek medical attention for facial edema.

## 2017-05-02 NOTE — Telephone Encounter (Signed)
She needs to seek medical attention due to facial edema.   You are correct, EGD was done at least 6 weeks ago. If she is referring to this, I have looked through documentation and there is no post-procedure concerns. Regardless, needs to seek medical attention.   I will route to [place] just as FYI.

## 2017-05-03 ENCOUNTER — Encounter (HOSPITAL_COMMUNITY): Payer: Medicare Other | Admitting: Physical Therapy

## 2017-05-03 NOTE — Telephone Encounter (Signed)
Reviewed

## 2017-05-05 ENCOUNTER — Encounter (HOSPITAL_COMMUNITY): Payer: Medicare Other | Admitting: Physical Therapy

## 2017-05-09 ENCOUNTER — Other Ambulatory Visit: Payer: Self-pay

## 2017-05-09 DIAGNOSIS — M48062 Spinal stenosis, lumbar region with neurogenic claudication: Secondary | ICD-10-CM

## 2017-05-10 ENCOUNTER — Encounter (HOSPITAL_COMMUNITY): Payer: Medicare Other | Admitting: Physical Therapy

## 2017-05-10 MED ORDER — AMITRIPTYLINE HCL 150 MG PO TABS: ORAL_TABLET | ORAL | 5 refills | Status: DC

## 2017-05-12 ENCOUNTER — Encounter (HOSPITAL_COMMUNITY): Payer: Medicare Other | Admitting: Physical Therapy

## 2017-05-17 ENCOUNTER — Encounter (HOSPITAL_COMMUNITY): Payer: Medicare Other | Admitting: Physical Therapy

## 2017-05-19 ENCOUNTER — Encounter (HOSPITAL_COMMUNITY): Payer: Medicare Other | Admitting: Physical Therapy

## 2017-05-30 ENCOUNTER — Other Ambulatory Visit: Payer: Self-pay | Admitting: Internal Medicine

## 2017-05-30 DIAGNOSIS — A609 Anogenital herpesviral infection, unspecified: Secondary | ICD-10-CM

## 2017-06-12 ENCOUNTER — Other Ambulatory Visit: Payer: Self-pay | Admitting: Gastroenterology

## 2017-07-15 ENCOUNTER — Encounter: Payer: Self-pay | Admitting: Gastroenterology

## 2017-07-15 ENCOUNTER — Other Ambulatory Visit: Payer: Self-pay

## 2017-07-15 ENCOUNTER — Ambulatory Visit (INDEPENDENT_AMBULATORY_CARE_PROVIDER_SITE_OTHER): Payer: Medicare Other | Admitting: Gastroenterology

## 2017-07-15 ENCOUNTER — Telehealth: Payer: Self-pay

## 2017-07-15 VITALS — BP 145/72 | HR 76 | Temp 97.2°F | Ht 64.0 in | Wt 179.0 lb

## 2017-07-15 DIAGNOSIS — K5900 Constipation, unspecified: Secondary | ICD-10-CM

## 2017-07-15 DIAGNOSIS — K625 Hemorrhage of anus and rectum: Secondary | ICD-10-CM | POA: Diagnosis not present

## 2017-07-15 MED ORDER — NA SULFATE-K SULFATE-MG SULF 17.5-3.13-1.6 GM/177ML PO SOLN: 1.0000 | ORAL | 0 refills | Status: DC

## 2017-07-15 MED ORDER — LUBIPROSTONE 24 MCG PO CAPS: 24.0000 ug | ORAL_CAPSULE | Freq: Two times a day (BID) | ORAL | 3 refills | Status: DC

## 2017-07-15 MED ORDER — HYDROCORTISONE 2.5 % RE CREA: 1.0000 "application " | TOPICAL_CREAM | Freq: Two times a day (BID) | RECTAL | 1 refills | Status: DC

## 2017-07-15 NOTE — Patient Instructions (Signed)
We have arranged a colonoscopy with Dr. Gala Romney in the near future.   For rectal discomfort, we have added a cream called anusol. Apply per rectum twice a day for 7 days.   We will see you back in 4 months!  It was great to see you both!  It was a pleasure to see you today. I strive to create trusting relationships with patients to provide genuine, compassionate, and quality care. I value your feedback. If you receive a survey regarding your visit,  I greatly appreciate you taking time to fill this out.   Annitta Needs, PhD, ANP-BC [facility]

## 2017-07-15 NOTE — Telephone Encounter (Signed)
Tried to call pt to inform of pre-op appt 09/08/17 at 12:45pm, no answer, LMOVM. Letter mailed.

## 2017-07-15 NOTE — Progress Notes (Signed)
Referring Provider: Sydnee Levans* Primary Care Physician:  Verner Mould, MD  Primary GI: Dr. Gala Romney   Chief Complaint  Patient presents with  . Abdominal Pain  . Rectal Bleeding    bright red, x2 past 2 weeks    HPI:   [name] is a 57 y.o. female presenting today with a history of chronic constipation, GERD, dysphagia. Multiple dilations in the past, with one most recently several months ago: mild Schatzki's ring, s/p dilation. Small hiatal hernia. Otherwise normal. History of chronically elevated alk phos with serologic markers negative for PBC. Hep BsAg and Hep C antibody negative several years ago. GGT normal. Normal transaminases. Elastography with F0/F1. AMA, ASMA continue to be negative. ANA is positive, and this is new (was negative several years ago). Pattern of ANA appears to be associated with SLE, drug-induced lupus, etc.  No concern for CBD dilation. CT on file from Feb 2019 with probable jejunal-jejunal intussusception, felt to be transient, no obstructive signs. CTE normal.   Constipation: historically managed with Amitiza 24 mcg BID. Last colonoscopy in 2015 with Grade 3 and 4 internal hemorrhoids. Notes past few weeks has had "straight blood" for 2 episodes. She is under a lot of stress with family issues. Pain lower abdomen and bloating. BM 2-3 times per week, no straining. Sometimes up to a full week without a BM. Only taking Amitiza once daily, and she says this is how the prescription was sent in. Linzess has not worked as well in the past. Occasional rectal discomfort.    Past Medical History:  Diagnosis Date  . Anxiety   . Asthma    every now and then.  Wheezing and coughing  . Cancer ([place]) 1997   left lung  . Chronic abdominal pain   . Chronic back pain   . Chronic joint pain   . Depression   . GERD (gastroesophageal reflux disease)   . HLD (hyperlipidemia)   . HTN (hypertension)   . Nausea and vomiting    chronic, recurrent   . Osteoarthrosis involving more than one site but not generalized   . Palpitations 01/2013   chronic, intermittent  . Shortness of breath dyspnea    with exertion    Past Surgical History:  Procedure Laterality Date  . ABDOMINAL HYSTERECTOMY    . ABDOMINAL SURGERY     ovarian cyst removal  . BIOPSY N/A 11/22/2013   Procedure: GASTRIC BIOPSY;  Surgeon: Daneil Dolin, MD;  [facility];  Service: Endoscopy;  Laterality: N/A;  . BLADDER SURGERY  suspension x4   x 4  . CARPAL TUNNEL RELEASE Right 07/11/2015   Procedure: RIGHT CARPAL TUNNEL RELEASE;  Surgeon: Jessy Oto, MD;  Location: [facility];  Service: Orthopedics;  Laterality: Right;  . CHOLECYSTECTOMY N/A 04/05/2014   Procedure: LAPAROSCOPIC CHOLECYSTECTOMY;  Surgeon: Aviva Signs Md, MD;  Location[facility];  Service: General;  Laterality: N/A;  . COCCYGECTOMY N/A 04/11/2015   Procedure: COCCYGECTOMY WITH OSTEOTOMY THROUGH AREA OF DEFORMITY;  Surgeon: Jessy Oto, MD;  Location: [facility];  Service: Orthopedics;  Laterality: N/A;  . COLONOSCOPY    . COLONOSCOPY WITH PROPOFOL N/A 11/22/2013   Dr. Gala Romney: Grade 3 and 4/internal hemorrhoids?"likely source of hematochezia Normal colonoscopy otherwise.   . ESOPHAGOGASTRODUODENOSCOPY (EGD) WITH PROPOFOL N/A 11/22/2013   Dr. Gala Romney: Incomplete Schatzki's ring dilated and disrupted as described above.  Small hiatal hernia. Focally abnormal gastric mucosa of uncertain significance status post  biopsy, reactive gastropathy, negative H.pylori  . ESOPHAGOGASTRODUODENOSCOPY (EGD) WITH PROPOFOL N/A 08/04/2015   Dr. Gala Romney: mild Schatzki's ring s/p dilation, small hiatal hernia   . ESOPHAGOGASTRODUODENOSCOPY (EGD) WITH PROPOFOL N/A 03/17/2017   Mild Schatki's ring s/p dilation, small hiatal hernia, otherwise normal  . EXCISION VAGINAL CYST Left 06/20/2012   Procedure: EXCISION LEFT LABIAL CYST;  Surgeon: Jonnie Kind, MD;  [facility];  Service: Gynecology;  Laterality: Left;  .  LOBECTOMY Left   . LUNG CANCER SURGERY  1997   age 72, resection only  . MALONEY DILATION N/A 11/22/2013   Procedure: [name] DILATION;  Surgeon: Daneil Dolin, MD;  [facility];  Service: Endoscopy;  Laterality: N/A;  56  . MALONEY DILATION N/A 08/04/2015   Procedure: [name] DILATION;  Surgeon: Daneil Dolin, MD;  [facility];  Service: Endoscopy;  Laterality: N/A;  [name] DILATION N/A 03/17/2017   Procedure: [name] DILATION;  Surgeon: Daneil Dolin, MD;  [facility];  Service: Endoscopy;  Laterality: N/A;  . MASS EXCISION N/A 07/23/2014   Procedure: EXCISIONAL BIOPSY NODULE LEFT PARACOCCYGEAL AREA;  Surgeon: Jessy Oto, MD;  Location: [facility];  Service: Orthopedics;  Laterality: N/A;  . MASS EXCISION Left 10/10/2015   Procedure: EXCISIONAL BIOPSY OF LEFT DORSORADIAL FOREARM MASS LIPOMA;  Surgeon: Jessy Oto, MD;  Location: [facility];  Service: Orthopedics;  Laterality: Left;  . TUBAL LIGATION      Current Outpatient Medications  Medication Sig Dispense Refill  . acyclovir (ZOVIRAX) 400 MG tablet TAKE 1 TABLET BY MOUTH TWICE DAILY 180 tablet 3  . albuterol (PROAIR HFA) 108 (90 Base) MCG/ACT inhaler Inhale 2 puffs into the lungs every 6 (six) hours as needed for wheezing or shortness of breath. 1 Inhaler 2  . amitriptyline (ELAVIL) 150 MG tablet TAKE 1 TABLET BY MOUTH ONCE DAILY AT NIGHT 30 tablet 5  . atorvastatin (LIPITOR) 40 MG tablet Take 1 tablet (40 mg total) by mouth daily. 90 tablet 3  . baclofen (LIORESAL) 10 MG tablet Take 1 tablet (10 mg total) by mouth 3 (three) times daily. (Patient taking differently: Take 10 mg by mouth as needed. ) 30 each 0  . DEXILANT 60 MG capsule TAKE ONE CAPSULE BY MOUTH ONCE DAILY 90 capsule 3  . DULoxetine (CYMBALTA) 60 MG capsule Take 1 capsule (60 mg total) by mouth 2 (two) times daily. 120 capsule 11  . gabapentin (NEURONTIN) 300 MG capsule Take 1 capsule (300 mg total) by mouth 3 (three) times  daily. 90 capsule 11  . hydrochlorothiazide (HYDRODIURIL) 25 MG tablet TAKE 1 TABLET BY MOUTH ONCE DAILY 90 tablet 2  . meloxicam (MOBIC) 15 MG tablet Take 1 tablet (15 mg total) by mouth daily. 30 tablet 2  . Multiple Vitamins-Minerals (HAIR SKIN AND NAILS FORMULA) TABS Take 2 tablets by mouth daily.    Marland Kitchen lubiprostone (AMITIZA) 24 MCG capsule Take 1 capsule (24 mcg total) by mouth 2 (two) times daily with a meal. 180 capsule 3   No current facility-administered medications for this visit.     Allergies as of 07/15/2017 - Review Complete 07/15/2017  Allergen Reaction Noted  . Promethazine hcl Nausea And Vomiting and Other (See Comments) 07/26/2008    Family History  Problem Relation Age of Onset  . Diabetes Other   . Heart disease Other        has Psychologist, forensic  . Hypertension Mother   . Kidney disease Mother   .  Hypertension Father   . Kidney disease Father   . Heart disease Father   . Cancer Father        leukemia  . Hypertension Sister   . Hypertension Sister   . Colon cancer Neg Hx     Social History   Socioeconomic History  . Marital status: Married    Spouse name: Not on file  . Number of children: 3  . Years of education: Not on file  . Highest education level: Not on file  Occupational History  . Not on file  Social Needs  . Financial resource strain: Not on file  . Food insecurity:    Worry: Not on file    Inability: Not on file  . Transportation needs:    Medical: Not on file    Non-medical: Not on file  Tobacco Use  . Smoking status: Former Smoker    Packs/day: 1.50    Years: 25.00    Pack years: 37.50    Types: Cigarettes    Last attempt to quit: 04/07/1995    Years since quitting: 22.2  . Smokeless tobacco: Never Used  Substance and Sexual Activity  . Alcohol use: No  . Drug use: No  . Sexual activity: Not Currently    Birth control/protection: Surgical  Lifestyle  . Physical activity:    Days per week: Not on file    Minutes per session: Not  on file  . Stress: Not on file  Relationships  . Social connections:    Talks on phone: Not on file    Gets together: Not on file    Attends religious service: Not on file    Active member of club or organization: Not on file    Attends meetings of clubs or organizations: Not on file    Relationship status: Not on file  Other Topics Concern  . Not on file  Social History Narrative  . Not on file    Review of Systems: Gen: Denies fever, chills, anorexia. Denies fatigue, weakness, weight loss.  CV: Denies chest pain, palpitations, syncope, peripheral edema, and claudication. Resp: Denies dyspnea at rest, cough, wheezing, coughing up blood, and pleurisy. GI: see HPI  Derm: Denies rash, itching, dry skin Psych: Denies depression, anxiety, memory loss, confusion. No homicidal or suicidal ideation.  Heme: Denies bruising, bleeding, and enlarged lymph nodes.  Physical Exam: BP (!) 145/72   Pulse 76   Temp (!) 97.2 F (36.2 C) (Oral)   Ht _0  (1.626 m)   Wt 179 lb (81.2 kg)   BMI 30.73 kg/m  General:   Alert and oriented. No distress noted. Pleasant and cooperative.  Head:  Normocephalic and atraumatic. Eyes:  Conjuctiva clear without scleral icterus. Mouth:  Oral mucosa pink and moist.  Abdomen:  +BS, soft, non-tender and non-distended. No rebound or guarding. No HSM or masses noted. Msk:  Symmetrical without gross deformities. Normal posture. Extremities:  Without edema. Neurologic:  Alert and  oriented x4 Psych:  Alert and cooperative. Normal mood and affect.

## 2017-07-22 NOTE — Assessment & Plan Note (Signed)
Not ideally managed. Resume BID dosing of Amitiza 24 mcg. Call if continued abdominal discomfort. No concerning signs on physical exam. 4 month follow-up

## 2017-07-22 NOTE — Assessment & Plan Note (Signed)
57 year old female with new onset rectal bleeding in the setting of chronic constipation not ideally managed. Last colonoscopy 2015 with Grade 3 and 4 hemorrhoids. No family history of colorectal cancer. I suspect this is a benign source in setting of constipation, but it has been 4 years since last evaluation. Will pursue early interval colonoscopy in near future. May or may not be a candidate for hemorrhoid banding dependent upon hemorrhoid classification.  Proceed with TCS with Dr. Gala Romney in near future: the risks, benefits, and alternatives have been discussed with the patient in detail. The patient states understanding and desires to proceed. Propofol due to polypharmacy Return in 4 months

## 2017-07-25 NOTE — Progress Notes (Signed)
CC'D TO PCP °

## 2017-08-11 ENCOUNTER — Other Ambulatory Visit: Payer: Self-pay

## 2017-08-11 ENCOUNTER — Encounter (HOSPITAL_COMMUNITY): Payer: Self-pay

## 2017-08-11 ENCOUNTER — Emergency Department (HOSPITAL_COMMUNITY)
Admission: EM | Admit: 2017-08-11 | Discharge: 2017-08-11 | Disposition: A | Discharge status: Home / Self Care | Payer: Medicare Other | Attending: Emergency Medicine | Admitting: Emergency Medicine

## 2017-08-11 DIAGNOSIS — J45909 Unspecified asthma, uncomplicated: Secondary | ICD-10-CM | POA: Diagnosis not present

## 2017-08-11 DIAGNOSIS — Z87891 Personal history of nicotine dependence: Secondary | ICD-10-CM | POA: Diagnosis not present

## 2017-08-11 DIAGNOSIS — G8929 Other chronic pain: Secondary | ICD-10-CM

## 2017-08-11 DIAGNOSIS — Z79899 Other long term (current) drug therapy: Secondary | ICD-10-CM | POA: Insufficient documentation

## 2017-08-11 DIAGNOSIS — Z85118 Personal history of other malignant neoplasm of bronchus and lung: Secondary | ICD-10-CM | POA: Insufficient documentation

## 2017-08-11 DIAGNOSIS — I10 Essential (primary) hypertension: Secondary | ICD-10-CM | POA: Diagnosis not present

## 2017-08-11 DIAGNOSIS — R202 Paresthesia of skin: Secondary | ICD-10-CM | POA: Diagnosis not present

## 2017-08-11 DIAGNOSIS — M25552 Pain in left hip: Secondary | ICD-10-CM | POA: Insufficient documentation

## 2017-08-11 MED ORDER — PREDNISONE 20 MG PO TABS
40.0000 mg | ORAL_TABLET | Freq: Once | ORAL | Status: AC
  Administered 2017-08-11: 40 mg via ORAL
  Filled 2017-08-11: qty 2

## 2017-08-11 MED ORDER — IBUPROFEN 400 MG PO TABS
400.0000 mg | ORAL_TABLET | Freq: Once | ORAL | Status: AC
  Administered 2017-08-11: 400 mg via ORAL
  Filled 2017-08-11: qty 1

## 2017-08-11 MED ORDER — MELOXICAM 7.5 MG PO TABS: 15.0000 mg | ORAL_TABLET | Freq: Every day | ORAL | 0 refills | Status: DC

## 2017-08-11 MED ORDER — PREDNISONE 20 MG PO TABS: 40.0000 mg | ORAL_TABLET | Freq: Every day | ORAL | 0 refills | Status: DC

## 2017-08-11 NOTE — Discharge Instructions (Addendum)
Back Pain:   Your back pain should be treated with medicines such as ibuprofen or aleve and this back pain should get better over the next 2 weeks.  However if you develop severe or worsening pain, low back pain with fever, numbness, weakness or inability to walk or urinate, you should return to the ER immediately.  Please follow up with your doctor this week for a recheck if still having symptoms. Low back pain is discomfort in the lower back that may be due to injuries to muscles and ligaments around the spine.  Occasionally, it may be caused by a a problem to a part of the spine called a disc.  The pain may last several days or a week;  However, most patients get completely well in 4 weeks.  Self - care:  The application of heat can help soothe the pain.  Maintaining your daily activities, including walking, is encourged, as it will help you get better faster than just staying in bed.  Medications are also useful to help with pain control.  A commonly prescribed medications includes acetaminophen.  This medication is generally safe, though you should not take more than 8 of the extra strength (500mg ) pills a day.  Non steroidal anti inflammatory medications including Ibuprofen and naproxen;  These medications help both pain and swelling and are very useful in treating back pain.  They should be taken with food, as they can cause stomach upset, and more seriously, stomach bleeding.    Muscle relaxants:  These medications can help with muscle tightness that is a cause of lower back pain.  Most of these medications can cause drowsiness, and it is not safe to drive or use dangerous machinery while taking them.  You will need to follow up with  Your primary healthcare provider in 1-2 weeks for reassessment.  Be aware that if you develop new symptoms, such as a fever, leg weakness, difficulty with or loss of control of your urine or bowels, abdominal pain, or more severe pain, you will need to seek  medical attention and  / or return to the Emergency department.  If you do not have a doctor see the list below.  RESOURCE GUIDE  Chronic Pain Problems: Contact [facility]  [phone] Patients need to be referred by their primary care doctor.  Insufficient Money for Medicine: Contact [employer]:  call "211" or [place] [phone].  No Primary Care Doctor: Call [facility]  [phone] - can help you locate a primary care doctor that  accepts your insurance, provides certain services, etc. Physician Referral [phone]  Agencies that provide inexpensive medical care: [facility]  [facility]  [phone] [facility]  [phone] [facility]  [phone] [facility]  [phone] [facility]  [phone]  [place] Providers: [facility]- [address], [address]  [phone], Mon-Fri 9am-7pm, Sat 9am-1pm [place], [address] [place]  [place], [address] [place]  [place]- [address]  [place], [address], [phone]  Only accepts [place] patients after they have their name  applied to their card  Self Pay (no insurance) in [facility]: Sickle Cell Patients: Dr Kevan Ny, [facility]  [place], [facility]- [address]  [facility] [place]- [address], [facility]- see information above (Speak to [employer] if you do not have insurance)       -  [facility]- [place], [place],  [place], [place]  Dr Vista Lawman-   [address], [place], [facility]- [address] [phone]       -  [facility] [place]- [address], [place], also [address] [phone]       -    [facility]- [address], [place], 1st & 3rd Saturday   every month, 10am-1pm  1) Find a Doctor and Pay Out of Pocket Although you won't have to find out who is covered by your insurance plan, it is a good idea to ask around and get recommendations. You will then need to call the office and see if the doctor you have chosen will accept you as a new patient and what types of options they offer for patients who are self-pay. Some doctors offer discounts or will set up payment plans for their patients who do not have insurance, but you will need to ask so you aren't surprised when you get to your appointment.  2) Contact Your Local Health Department Not all health departments have doctors that can see patients for sick visits, but many do, so it is worth a call to see if yours does. If you don't know where your local health department is, you can check in your phone book. The CDC also has a tool to help you locate your state's health department, and many state websites also have listings of all of their local health departments.  3) Find a [facility] If your illness is not likely to be very severe or complicated, you may want to try a walk in clinic. These are popping up all over the country in pharmacies, drugstores, and shopping centers. They're usually staffed by nurse practitioners or physician assistants that have been trained to treat common illnesses and complaints. They're usually fairly quick and inexpensive. However, if you have serious medical issues or chronic medical problems, these are probably not your best option  [facility] [place], [facility], [address] [place], phone [phone] or  [phone].  Monday - Friday, call for an appointment. [place], [facility], [address], [place], phone [phone] or [phone].  Monday - Friday, call for an appointment.  Abuse/Neglect: [place] [phone] [place] [phone] (After Hours)  Emergency Shelter:  [facility] [phone]  Maternity Homes: [facility] [place] [phone] [place] [phone]  MRSA Hotline #:   (831)310-5166  [facility] [place] Dept. [address]  [place] Phone:  [phone]                                  Phone:  [phone]                   Phone:  [phone]  [facility], [place]- [phone]       -     [facility] in [place], [address],                                  [place] [phone] or [phone] (After Hours)   [place]  Substance Abuse Resources: [facility] and Drug Services  [phone] [place] [phone] [facility] [place] [phone] Residential & Outpatient Substance Abuse Program  [phone]  Psychological Services: [place]  [phone] [place]  [place], [place]. [address] [place], [place]: [phone] or [phone], [employer]  Dental Assistance  If unable to pay or uninsured, contact:  [facility] or [facility]. to  become qualified for the adult dental clinic.  Patients with Medicaid: [facility] [phone] [address], [place] [address] [phone]  If unable to pay, or uninsured, contact [facility] [phone]) or [place] [phone] in [place], [place] in [facility]) to become qualified for the adult dental clinic  Other [place]- [place], [place], [place], [address], [place], [place], 2nd and 4th Thursday of the month at 6:30am.  10 clients each day by appointment, can sometimes see walk-in patients if someone does not show for an appointment. [facility]- [address] [place], [place], [address], [place], [place], [place], [address], [place] [facility] [phone]    We have prescribed the following medicines  Prednisone 40mg  by mouth daily for 5 days Mobic 7.5 mg twice daily for 2 weeks

## 2017-08-11 NOTE — ED Notes (Signed)
ED Provider at bedside. 

## 2017-08-11 NOTE — ED Provider Notes (Signed)
[facility] EMERGENCY DEPARTMENT Provider Note   CSN: [id number] Arrival date & time: 08/11/17  6294     History   Chief Complaint Chief Complaint  Patient presents with  . Hip Pain    HPI [name] is a 57 y.o. female.  HPI  57 year old female, she has a history of lung cancer but also has a history of chronic joint pain, chronic back pain and has Artie undergone back surgery with Dr. Louanne Skye who she states put a cage around her lower spine.  She has most recently followed up with him within the last 2 months for the same complaint involving her left hip, left lower back with pain and numbness radiating down the leg into the foot on the left side.  She has no numbness until recently where she started having some tingling of her middle 3 toes on the left side.  She has difficulty ambulating because it causes pain in her hip and back when she walks.  This is been going on for months, she is Artie seen her orthopedist who referred her to her family doctor.  She feels like the pain is persistent, worse with standing, not associated with fevers.  She has not had any further work-up but has an appointment with your doctor this week for further testing.  Past Medical History:  Diagnosis Date  . Anxiety   . Asthma    every now and then.  Wheezing and coughing  . Cancer ([name]) 1997   left lung  . Chronic abdominal pain   . Chronic back pain   . Chronic joint pain   . Depression   . GERD (gastroesophageal reflux disease)   . HLD (hyperlipidemia)   . HTN (hypertension)   . Nausea and vomiting    chronic, recurrent  . Osteoarthrosis involving more than one site but not generalized   . Palpitations 01/2013   chronic, intermittent  . Shortness of breath dyspnea    with exertion    Patient Active Problem List   Diagnosis Date Noted  . Dysphagia 01/20/2017  . Left hip pain 01/19/2017  . Right hand pain 01/19/2017  . Diarrhea 10/11/2016  . Right hip pain 06/07/2016  . Dysuria  04/14/2016  . Malaise and fatigue 04/14/2016  . Breast pain, left 11/06/2015  . Mass of left forearm 10/10/2015    Class: Chronic  . Dysphagia, oropharyngeal   . Gastroesophageal reflux disease without esophagitis   . Carpal tunnel syndrome, right 07/11/2015    Class: Chronic  . Prediabetes 07/09/2015  . Lumbar stenosis with neurogenic claudication 01/05/2015  . Spondylolisthesis of lumbar region 01/03/2015    Class: Chronic  . Spinal stenosis, lumbar region, with neurogenic claudication 01/03/2015    Class: Chronic  . Soft tissue mass 07/23/2014    Class: Acute  . Coccygodynia 07/23/2014    Class: Acute  . Healthcare maintenance 03/19/2014  . Constipation 02/15/2014  . Dysphagia, pharyngoesophageal phase   . Elevated alkaline phosphatase level 11/06/2013  . Rectal bleeding 11/06/2013  . Abdominal pain 10/03/2013  . Obesity, unspecified 02/15/2013  . Genital herpes 10/12/2012  . HSV (herpes simplex virus) anogenital infection 09/29/2012  . UNSPECIFIED SLEEP APNEA 12/04/2008  . ADENOCARCINOMA, LUNG 10/09/2008  . Osteoarthritis of multiple joints 07/13/2006  . HYPERCHOLESTEROLEMIA 03/17/2006  . DEPRESSION, MAJOR, RECURRENT 03/17/2006  . HYPERTENSION, BENIGN SYSTEMIC 03/17/2006  . GASTROESOPHAGEAL REFLUX, NO ESOPHAGITIS 03/17/2006  . INSOMNIA NOS 03/17/2006    Past Surgical History:  Procedure Laterality Date  .  ABDOMINAL HYSTERECTOMY    . ABDOMINAL SURGERY     ovarian cyst removal  . BIOPSY N/A 11/22/2013   Procedure: GASTRIC BIOPSY;  Surgeon: Daneil Dolin, MD;  Location: [facility];  Service: Endoscopy;  Laterality: N/A;  . BLADDER SURGERY  suspension x4   x 4  . CARPAL TUNNEL RELEASE Right 07/11/2015   Procedure: RIGHT CARPAL TUNNEL RELEASE;  Surgeon: Jessy Oto, MD;  Location: [place];  Service: Orthopedics;  Laterality: Right;  . CHOLECYSTECTOMY N/A 04/05/2014   Procedure: LAPAROSCOPIC CHOLECYSTECTOMY;  Surgeon: Aviva Signs Md, MD;  Location: [facility];  Service: General;  Laterality: N/A;  . COCCYGECTOMY N/A 04/11/2015   Procedure: COCCYGECTOMY WITH OSTEOTOMY THROUGH AREA OF DEFORMITY;  Surgeon: Jessy Oto, MD;  Location: [place];  Service: Orthopedics;  Laterality: N/A;  . COLONOSCOPY    . COLONOSCOPY WITH PROPOFOL N/A 11/22/2013   Dr. Gala Romney: Grade 3 and 4/internal hemorrhoids?"likely source of hematochezia Normal colonoscopy otherwise.   . ESOPHAGOGASTRODUODENOSCOPY (EGD) WITH PROPOFOL N/A 11/22/2013   Dr. Gala Romney: Incomplete Schatzki's ring dilated and disrupted as described above.  Small hiatal hernia. Focally abnormal gastric mucosa of uncertain significance status post biopsy, reactive gastropathy, negative H.pylori  . ESOPHAGOGASTRODUODENOSCOPY (EGD) WITH PROPOFOL N/A 08/04/2015   Dr. Gala Romney: mild Schatzki's ring s/p dilation, small hiatal hernia   . ESOPHAGOGASTRODUODENOSCOPY (EGD) WITH PROPOFOL N/A 03/17/2017   Mild Schatki's ring s/p dilation, small hiatal hernia, otherwise normal  . EXCISION VAGINAL CYST Left 06/20/2012   Procedure: EXCISION LEFT LABIAL CYST;  Surgeon: Jonnie Kind, MD;  Location: [facility];  Service: Gynecology;  Laterality: Left;  . LOBECTOMY Left   . LUNG CANCER SURGERY  1997   age 45, resection only  . MALONEY DILATION N/A 11/22/2013   Procedure: [name] DILATION;  Surgeon: Daneil Dolin, MD;  Location: [facility];  Service: Endoscopy;  Laterality: N/A;  56  . MALONEY DILATION N/A 08/04/2015   Procedure: [name] DILATION;  Surgeon: Daneil Dolin, MD;  Location: [facility];  Service: Endoscopy;  Laterality: N/A;  [name] DILATION N/A 03/17/2017   Procedure: [name] DILATION;  Surgeon: Daneil Dolin, MD;  Location: [facility];  Service: Endoscopy;  Laterality: N/A;  . MASS EXCISION N/A 07/23/2014   Procedure: EXCISIONAL BIOPSY NODULE LEFT PARACOCCYGEAL AREA;  Surgeon: Jessy Oto, MD;  Location: [facility];  Service: Orthopedics;  Laterality: N/A;  . MASS EXCISION Left 10/10/2015   Procedure: EXCISIONAL BIOPSY OF  LEFT DORSORADIAL FOREARM MASS LIPOMA;  Surgeon: Jessy Oto, MD;  Location: [place];  Service: Orthopedics;  Laterality: Left;  . TUBAL LIGATION       OB History    Gravida  3   Para  3   Term  3   Preterm      AB      Living  3     SAB      TAB      Ectopic      Multiple      Live Births               Home Medications    Prior to Admission medications   Medication Sig Start Date End Date Taking? Authorizing Provider  acyclovir (ZOVIRAX) 400 MG tablet TAKE 1 TABLET BY MOUTH TWICE DAILY 05/30/17   Verner Mould, MD  albuterol [facility] HFA) [phone] Base) MCG/ACT inhaler Inhale 2 puffs into the lungs every 6 (six) hours as needed  for wheezing or shortness of breath. 11/02/16   Verner Mould, MD  amitriptyline (ELAVIL) 150 MG tablet TAKE 1 TABLET BY MOUTH ONCE DAILY AT NIGHT 05/10/17   Lucila Maine C, DO  atorvastatin (LIPITOR) 40 MG tablet Take 1 tablet (40 mg total) by mouth daily. 02/16/17   Verner Mould, MD  baclofen (LIORESAL) 10 MG tablet Take 1 tablet (10 mg total) by mouth 3 (three) times daily. Patient taking differently: Take 10 mg by mouth as needed.  03/31/17   Jessy Oto, MD  DEXILANT 60 MG capsule TAKE ONE CAPSULE BY MOUTH ONCE DAILY 09/22/16   Mahala Menghini, PA-C  DULoxetine (CYMBALTA) 60 MG capsule Take 1 capsule (60 mg total) by mouth 2 (two) times daily. 04/19/17   Verner Mould, MD  gabapentin (NEURONTIN) 300 MG capsule Take 1 capsule (300 mg total) by mouth 3 (three) times daily. 04/19/17   Verner Mould, MD  hydrochlorothiazide (HYDRODIURIL) 25 MG tablet TAKE 1 TABLET BY MOUTH ONCE DAILY 12/01/16   Verner Mould, MD  hydrocortisone (ANUSOL-HC) 2.5 % rectal cream Place 1 application rectally 2 (two) times daily. 07/15/17   Annitta Needs, NP  lubiprostone (AMITIZA) 24 MCG capsule Take 1 capsule (24 mcg total) by mouth 2 (two) times daily with a meal. 07/15/17    Annitta Needs, NP  meloxicam (MOBIC) 7.5 MG tablet Take 2 tablets (15 mg total) by mouth daily. 08/11/17   Noemi Chapel, MD  Multiple Vitamins-Minerals (HAIR SKIN AND NAILS FORMULA) TABS Take 2 tablets by mouth daily.    [provider]  Na Sulfate-K Sulfate-Mg Sulf (SUPREP BOWEL PREP KIT) 17.5-3.13-1.6 GM/177ML SOLN Take 1 kit by mouth as directed. 07/15/17   Rourk, [name], MD  predniSONE (DELTASONE) 20 MG tablet Take 2 tablets (40 mg total) by mouth daily. 08/11/17   Noemi Chapel, MD    Family History Family History  Problem Relation Age of Onset  . Diabetes Other   . Heart disease Other        has Psychologist, forensic  . Hypertension Mother   . Kidney disease Mother   . Hypertension Father   . Kidney disease Father   . Heart disease Father   . Cancer Father        leukemia  . Hypertension Sister   . Hypertension Sister   . Colon cancer Neg Hx     Social History Social History   Tobacco Use  . Smoking status: Former Smoker    Packs/day: 1.50    Years: 25.00    Pack years: 37.50    Types: Cigarettes    Last attempt to quit: 04/07/1995    Years since quitting: 22.3  . Smokeless tobacco: Never Used  Substance Use Topics  . Alcohol use: No  . Drug use: No     Allergies   Promethazine hcl   Review of Systems Review of Systems  All other systems reviewed and are negative.    Physical Exam Updated Vital Signs BP 140/71 (BP Location: Left Arm)   Pulse 71   Temp 98 F (36.7 C) (Oral)   Resp 18   Ht '5\' 4"'  (1.626 m)   Wt 78.9 kg (174 lb)   SpO2 98%   BMI 29.87 kg/m   Physical Exam  Constitutional: She appears well-developed and well-nourished.  HENT:  Head: Normocephalic and atraumatic.  Eyes: Conjunctivae are normal. Right eye exhibits no discharge. Left eye exhibits no discharge.  Pulmonary/Chest: Effort normal.  No respiratory distress.  Abdominal: Soft. There is no tenderness.  Musculoskeletal:  There is pain with movement of the lower extremity, she  is able to perform all of the movements which I asked of her however there is some tenderness with those movements.  She has supple joints diffusely, the compartments are all diffusely soft, there is no muscle wasting or asymmetry of the legs, no edema  Neurological: She is alert. Coordination normal.  Normal range of motion of all 4 extremities, she is able to straight leg raise bilaterally with normal strength, she does have some decreased sensation to the plantar aspect of the foot in the middle 3 toes but normal sensation to the medial and lateral surfaces of the foot as well as over the knee and up the proximal leg.  She has normal strength with plantar and dorsiflexion at the left ankle.  Speech is clear, cranial nerves III through XII appear normal.  She follows commands without difficulty, memory is intact  Skin: Skin is warm and dry. No rash noted. She is not diaphoretic. No erythema.  There is no rash anywhere on the left lower extremity  Psychiatric: She has a normal mood and affect.  Nursing note and vitals reviewed.    ED Treatments / Results  Labs (all labs ordered are listed, but only abnormal results are displayed) Labs Reviewed - No data to display  EKG None  Radiology No results found.  Procedures Procedures (including critical care time)  Medications Ordered in ED Medications  predniSONE (DELTASONE) tablet 40 mg (has no administration in time range)  ibuprofen (ADVIL,MOTRIN) tablet 400 mg (has no administration in time range)     Initial Impression / Assessment and Plan / ED Course  I have reviewed the triage vital signs and the nursing notes.  Pertinent labs & imaging results that were available during my care of the patient were reviewed by me and considered in my medical decision making (see chart for details).    With 2 months of pain or longer and Artie having been seen by the orthopedist I do not think that there are any emergency conditions which need  further imaging here today.  She actually has very little paresthesias located only in the left foot over the 3 toes, there is no weakness, she is ambulatory, she may have some lumbar radiculopathy, she can follow-up outpatient for imaging, will initiate some steroid therapy, she is agreeable to this plan.  The sensory deficits for this patient are consistent with an L5 dermatomal distribution.  Final Clinical Impressions(s) / ED Diagnoses   Final diagnoses:  Chronic left hip pain  Paresthesia    ED Discharge Orders        Ordered    predniSONE (DELTASONE) 20 MG tablet  Daily     08/11/17 0805    meloxicam (MOBIC) 7.5 MG tablet  Daily     08/11/17 0805       Noemi Chapel, MD 08/11/17 [phone]

## 2017-08-11 NOTE — ED Triage Notes (Signed)
Pt's left hip is hurting down into her knee. Also states her toes are now starting to tingle. Noted swelling to left knee. States this has been happening for a few months now. Has an appointment next with her PCP next week but he referred her here.

## 2017-08-18 ENCOUNTER — Encounter: Payer: Self-pay | Admitting: Family Medicine

## 2017-08-18 ENCOUNTER — Ambulatory Visit (INDEPENDENT_AMBULATORY_CARE_PROVIDER_SITE_OTHER): Payer: Medicare Other | Admitting: Family Medicine

## 2017-08-18 VITALS — BP 135/80 | HR 83 | Temp 98.4°F | Ht 64.0 in | Wt 180.0 lb

## 2017-08-18 DIAGNOSIS — M25552 Pain in left hip: Secondary | ICD-10-CM

## 2017-08-18 MED ORDER — BACLOFEN 10 MG PO TABS: 10.0000 mg | ORAL_TABLET | ORAL | 0 refills | Status: DC | PRN

## 2017-08-18 MED ORDER — DICLOFENAC SODIUM 1 % TD GEL: 4.0000 g | Freq: Four times a day (QID) | TRANSDERMAL | 1 refills | Status: DC

## 2017-08-18 NOTE — Patient Instructions (Addendum)
It was great meeting you today! I am so sorry that you have been having so much trouble with your left hip. I think an MRI is a good next option given that you have had the history of left nerve impingement in the hip. I also saw that on the MRI a year ago you had a partial torn gluteus maximus tendon. The MRI is scheduled for 8/7. In the meantime I will give you a muscle relaxer to help with the pain at night. I will also give you voltaren gel, which is a topical anti-inflammatory that you can rub into the affected areas.

## 2017-08-19 ENCOUNTER — Other Ambulatory Visit: Payer: Self-pay | Admitting: Internal Medicine

## 2017-08-19 DIAGNOSIS — I10 Essential (primary) hypertension: Secondary | ICD-10-CM

## 2017-08-21 NOTE — Assessment & Plan Note (Signed)
Acute on chronic left hip pain. Will get MRI to see if still having nerve impingement. If structural cause will instruct her to follow up with orthopedics.

## 2017-08-21 NOTE — Progress Notes (Signed)
   HPI  57 year old who presents with left hip pain. Patient states that she has chronic hip pain, but that she has had increased pain in this hip very recently. Patient is followed by orthpedics for chronic pain of her left knee. She states that injections have helped her periodically in the past. The pain is worse with standing but it is always there. She has felt some tingling in her left leg and foot. This has also started acutely.  Patient did have an mri in 08/2016 which did show a partially torn gluteal maximus tendon and ischiofemoral impingement.  CC: left hip pain   ROS:   Review of Systems See HPI for ROS.   CC, SH/smoking status, and VS noted  Objective: BP 135/80   Pulse 83   Temp 98.4 F (36.9 C) (Oral)   Ht 5\' 4"  (1.626 m)   Wt 180 lb (81.6 kg)   SpO2 98%   BMI 30.90 kg/m  Gen: NAD, alert, cooperative, and pleasant. Well appearing AA female HEENT: NCAT, EOMI, PERRL CV: RRR, no murmur Resp: CTAB, no wheezes, non-labored Abd: SNTND, BS present, no guarding or organomegaly Ext: No edema, warm Neuro: Alert and oriented, Speech clear, No gross deficits MSK: tenderness to palpation in posterior left hip. Difficulty with hip flexion. Limited gait, putting majority of weight onto right leg.   Assessment and plan:  Left hip pain Acute on chronic left hip pain. Will get MRI to see if still having nerve impingement. If structural cause will instruct her to follow up with orthopedics.   Orders Placed This Encounter  Procedures  . MR PELVIS WO CONTRAST    Standing Status:   Future    Standing Expiration Date:   10/19/2018    Order Specific Question:   What is the patient's sedation requirement?    Answer:   No Sedation    Order Specific Question:   Does the patient have a pacemaker or implanted devices?    Answer:   No    Order Specific Question:   Preferred imaging location?    Answer:   [facility] (table limit-350lbs)    Order Specific Question:    Radiology Contrast Protocol - do NOT remove file path    Answer:   [email or web address]    Meds ordered this encounter  Medications  . baclofen (LIORESAL) 10 MG tablet    Sig: Take 1 tablet (10 mg total) by mouth as needed.    Dispense:  30 tablet    Refill:  0  . diclofenac sodium (VOLTAREN) 1 % GEL    Sig: Apply 4 g topically 4 (four) times daily.    Dispense:  100 g    Refill:  1     Guadalupe Dawn MD PGY-2 Family Medicine Resident  08/21/2017 1:12 PM

## 2017-08-21 NOTE — Assessment & Plan Note (Deleted)
Patient with acute on chronic left hip pain.

## 2017-08-24 ENCOUNTER — Ambulatory Visit (HOSPITAL_COMMUNITY)
Admission: RE | Admit: 2017-08-24 | Discharge: 2017-08-24 | Disposition: A | Discharge status: Home / Self Care | Payer: Medicare Other | Source: Ambulatory Visit | Attending: Family Medicine | Admitting: Family Medicine

## 2017-08-24 DIAGNOSIS — M25552 Pain in left hip: Secondary | ICD-10-CM | POA: Insufficient documentation

## 2017-08-26 ENCOUNTER — Other Ambulatory Visit: Payer: Self-pay

## 2017-08-26 DIAGNOSIS — M48062 Spinal stenosis, lumbar region with neurogenic claudication: Secondary | ICD-10-CM

## 2017-08-26 DIAGNOSIS — I10 Essential (primary) hypertension: Secondary | ICD-10-CM

## 2017-08-26 NOTE — Telephone Encounter (Signed)
Patient calling for MRI results and for refills.  Wanted PCP to know pain was excruciating last night.   Call back is [phone]  Danley Danker, RN [facility] RN)

## 2017-08-29 ENCOUNTER — Telehealth: Payer: Self-pay | Admitting: *Deleted

## 2017-08-29 MED ORDER — GABAPENTIN 300 MG PO CAPS: 300.0000 mg | ORAL_CAPSULE | Freq: Three times a day (TID) | ORAL | 11 refills | Status: DC

## 2017-08-29 MED ORDER — HYDROCHLOROTHIAZIDE 25 MG PO TABS: 25.0000 mg | ORAL_TABLET | Freq: Every day | ORAL | 2 refills | Status: DC

## 2017-08-29 MED ORDER — PREGABALIN 100 MG PO CAPS: 100.0000 mg | ORAL_CAPSULE | Freq: Three times a day (TID) | ORAL | 0 refills | Status: DC

## 2017-08-29 NOTE — Addendum Note (Signed)
Addended by: Pauletta Browns on: 08/29/2017 09:25 AM   Modules accepted: Orders

## 2017-08-29 NOTE — Telephone Encounter (Signed)
Called patient and discussed hip and knee pain. Informed her that MRI showed no causes to account for her intense pain. She describes the pain as a burning in her knee and hip. She has known chronic back pain. Patient wanting to know why we didn't do back MRI and left knee xray. These were already done back in march and was not felt to significantly change management in any way.  Per review of chart, her orthopedist ordered these previous tests. He wanted her to follow up in May of this year but the patient did not keep that appointment. Strongly recommend patient follow up with Dr. Louanne Skye, her her known spinal stenosis, and knee osteoarthritis are almost certainly the cause of her symptoms. Given her pre-existing relationship with Dr. Louanne Skye she should not need a new referral.  I will change her gabapentin to lyrica to see if she is able to get any additional relief. Informed patient to NOT take both at the same time.  Guadalupe Dawn MD PGY-2 Family Medicine Resident

## 2017-08-29 NOTE — Telephone Encounter (Signed)
Pt is requesting the results of her MRI.  She wants to let Dr. Kris Mouton know that she is having more pain. [name], Salome Spotted, CMA

## 2017-09-01 ENCOUNTER — Telehealth: Payer: Self-pay | Admitting: Internal Medicine

## 2017-09-01 NOTE — Telephone Encounter (Signed)
Tried calling pt. Number is busy.

## 2017-09-01 NOTE — Telephone Encounter (Signed)
Spoke with pt and she is aware that she needs to follow up with a dentist for removal of her tooth.

## 2017-09-01 NOTE — Telephone Encounter (Signed)
Tried calling pt several times. Number is either busy or rings one time only. Will call again.

## 2017-09-01 NOTE — Telephone Encounter (Signed)
PATIENT CALLED AND ASKED IF SHE CAN HAVE HER TOOTH PULLED WHILE SHE IS UNDER FOR HER ENDOSCOPY.  SAID LAST PROCEDURE HER TOOTH GOT "KNOCKED LOOSE" AND SHE WILL SIGN WHATEVER PAPERWORK SHE NEEDS SO THEY CAN "JUST PULL IT OUT" BECAUSE IT'S REALLY LOSE AND BOTHERING HER.  [phone]

## 2017-09-06 ENCOUNTER — Other Ambulatory Visit: Payer: Self-pay | Admitting: Family Medicine

## 2017-09-07 NOTE — Patient Instructions (Signed)
[name]  09/07/2017     @PREFPERIOPPHARMACY @   Your procedure is scheduled on  09/15/2017   Report to [facility] at  615  A.M.  Call this number if you have problems the morning of surgery:  [phone]   Remember:  Do not eat or drink after midnight.  You may drink clear liquids until ( follow the instructions given to you) .  Clear liquids allowed are:                    Water, Juice (non-citric and without pulp), Carbonated beverages, Clear Tea, Black Coffee only, Plain Jell-O only, Gatorade and Plain Popsicles only    Take these medicines the morning of surgery with A SIP OF WATER  Acyclovir, baclofen, dexilant, cymbalta, neurontin or pregabalin, mobic.    Do not wear jewelry, make-up or nail polish.  Do not wear lotions, powders, or perfumes, or deodorant.  Do not shave 48 hours prior to surgery.  Men may shave face and neck.  Do not bring valuables to the hospital.  [facility] is not responsible for any belongings or valuables.  Contacts, dentures or bridgework may not be worn into surgery.  Leave your suitcase in the car.  After surgery it may be brought to your room.  For patients admitted to the hospital, discharge time will be determined by your treatment team.  Patients discharged the day of surgery will not be allowed to drive home.   Name and phone number of your driver:   family Special instructions:  Follow the diet and prep instructions given to you by Dr Roseanne Kaufman office.  Please read over the following fact sheets that you were given. Anesthesia Post-op Instructions and Care and Recovery After Surgery       Colonoscopy, Adult A colonoscopy is an exam to look at the large intestine. It is done to check for problems, such as:  Lumps (tumors).  Growths (polyps).  Swelling (inflammation).  Bleeding.  What happens before the procedure? Eating and drinking Follow instructions from your doctor about eating and drinking. These  instructions may include:  A few days before the procedure - follow a low-fiber diet. ? Avoid nuts. ? Avoid seeds. ? Avoid dried fruit. ? Avoid raw fruits. ? Avoid vegetables.  1-3 days before the procedure - follow a clear liquid diet. Avoid liquids that have red or purple dye. Drink only clear liquids, such as: ? Clear broth or bouillon. ? Black coffee or tea. ? Clear juice. ? Clear soft drinks or sports drinks. ? Gelatin dessert. ? Popsicles.  On the day of the procedure - do not eat or drink anything during the 2 hours before the procedure.  Bowel prep If you were prescribed an oral bowel prep:  Take it as told by your doctor. Starting the day before your procedure, you will need to drink a lot of liquid. The liquid will cause you to poop (have bowel movements) until your poop is almost clear or light green.  If your skin or butt gets irritated from diarrhea, you may: ? Wipe the area with wipes that have medicine in them, such as adult wet wipes with aloe and vitamin E. ? Put something on your skin that soothes the area, such as petroleum jelly.  If you throw up (vomit) while drinking the bowel prep, take a break for up to 60 minutes. Then begin the bowel prep again.  If you keep throwing up and you cannot take the bowel prep without throwing up, call your doctor.  General instructions  Ask your doctor about changing or stopping your normal medicines. This is important if you take diabetes medicines or blood thinners.  Plan to have someone take you home from the hospital or clinic. What happens during the procedure?  An IV tube may be put into one of your veins.  You will be given medicine to help you relax (sedative).  To reduce your risk of infection: ? Your doctors will wash their hands. ? Your anal area will be washed with soap.  You will be asked to lie on your side with your knees bent.  Your doctor will get a long, thin, flexible tube ready. The tube will have  a camera and a light on the end.  The tube will be put into your anus.  The tube will be gently put into your large intestine.  Air will be delivered into your large intestine to keep it open. You may feel some pressure or cramping.  The camera will be used to take photos.  A small tissue sample may be removed from your body to be looked at under a microscope (biopsy). If any possible problems are found, the tissue will be sent to a lab for testing.  If small growths are found, your doctor may remove them and have them checked for cancer.  The tube that was put into your anus will be slowly removed. The procedure may vary among doctors and hospitals. What happens after the procedure?  Your doctor will check on you often until the medicines you were given have worn off.  Do not drive for 24 hours after the procedure.  You may have a small amount of blood in your poop.  You may pass gas.  You may have mild cramps or bloating in your belly (abdomen).  It is up to you to get the results of your procedure. Ask your doctor, or the department performing the procedure, when your results will be ready. This information is not intended to replace advice given to you by your health care provider. Make sure you discuss any questions you have with your health care provider. Document Released: 02/06/2010 Document Revised: 11/05/2015 Document Reviewed: 03/18/2015 Elsevier Interactive Patient Education  2017 [employer]  Colonoscopy, Adult, Care After This sheet gives you information about how to care for yourself after your procedure. Your health care provider may also give you more specific instructions. If you have problems or questions, contact your health care provider. What can I expect after the procedure? After the procedure, it is common to have:  A small amount of blood in your stool for 24 hours after the procedure.  Some gas.  Mild abdominal cramping or bloating.  Follow  these instructions at home: General instructions   For the first 24 hours after the procedure: ? Do not drive or use machinery. ? Do not sign important documents. ? Do not drink alcohol. ? Do your regular daily activities at a slower pace than normal. ? Eat soft, easy-to-digest foods. ? Rest often.  Take over-the-counter or prescription medicines only as told by your health care provider.  It is up to you to get the results of your procedure. Ask your health care provider, or the department performing the procedure, when your results will be ready. Relieving cramping and bloating  Try walking around when you have cramps or feel bloated.  Apply  heat to your abdomen as told by your health care provider. Use a heat source that your health care provider recommends, such as a moist heat pack or a heating pad. ? Place a towel between your skin and the heat source. ? Leave the heat on for 20-30 minutes. ? Remove the heat if your skin turns bright red. This is especially important if you are unable to feel pain, heat, or cold. You may have a greater risk of getting burned. Eating and drinking  Drink enough fluid to keep your urine clear or pale yellow.  Resume your normal diet as instructed by your health care provider. Avoid heavy or fried foods that are hard to digest.  Avoid drinking alcohol for as long as instructed by your health care provider. Contact a health care provider if:  You have blood in your stool 2-3 days after the procedure. Get help right away if:  You have more than a small spotting of blood in your stool.  You pass large blood clots in your stool.  Your abdomen is swollen.  You have nausea or vomiting.  You have a fever.  You have increasing abdominal pain that is not relieved with medicine. This information is not intended to replace advice given to you by your health care provider. Make sure you discuss any questions you have with your health care  provider. Document Released: 08/19/2003 Document Revised: 09/29/2015 Document Reviewed: 03/18/2015 Elsevier Interactive Patient Education  2018 [place] Anesthesia is a term that refers to techniques, procedures, and medicines that help a person stay safe and comfortable during a medical procedure. Monitored anesthesia care, or sedation, is one type of anesthesia. Your anesthesia specialist may recommend sedation if you will be having a procedure that does not require you to be unconscious, such as:  Cataract surgery.  A dental procedure.  A biopsy.  A colonoscopy.  During the procedure, you may receive a medicine to help you relax (sedative). There are three levels of sedation:  Mild sedation. At this level, you may feel awake and relaxed. You will be able to follow directions.  Moderate sedation. At this level, you will be sleepy. You may not remember the procedure.  Deep sedation. At this level, you will be asleep. You will not remember the procedure.  The more medicine you are given, the deeper your level of sedation will be. Depending on how you respond to the procedure, the anesthesia specialist may change your level of sedation or the type of anesthesia to fit your needs. An anesthesia specialist will monitor you closely during the procedure. Let your health care provider know about:  Any allergies you have.  All medicines you are taking, including vitamins, herbs, eye drops, creams, and over-the-counter medicines.  Any use of steroids (by mouth or as a cream).  Any problems you or family members have had with sedatives and anesthetic medicines.  Any blood disorders you have.  Any surgeries you have had.  Any medical conditions you have, such as sleep apnea.  Whether you are pregnant or may be pregnant.  Any use of cigarettes, alcohol, or street drugs. What are the risks? Generally, this is a safe procedure. However, problems may  occur, including:  Getting too much medicine (oversedation).  Nausea.  Allergic reaction to medicines.  Trouble breathing. If this happens, a breathing tube may be used to help with breathing. It will be removed when you are awake and breathing on your own.  Heart trouble.  Lung trouble.  Before the procedure Staying hydrated Follow instructions from your health care provider about hydration, which may include:  Up to 2 hours before the procedure - you may continue to drink clear liquids, such as water, clear fruit juice, black coffee, and plain tea.  Eating and drinking restrictions Follow instructions from your health care provider about eating and drinking, which may include:  8 hours before the procedure - stop eating heavy meals or foods such as meat, fried foods, or fatty foods.  6 hours before the procedure - stop eating light meals or foods, such as toast or cereal.  6 hours before the procedure - stop drinking milk or drinks that contain milk.  2 hours before the procedure - stop drinking clear liquids.  Medicines Ask your health care provider about:  Changing or stopping your regular medicines. This is especially important if you are taking diabetes medicines or blood thinners.  Taking medicines such as aspirin and ibuprofen. These medicines can thin your blood. Do not take these medicines before your procedure if your health care provider instructs you not to.  Tests and exams  You will have a physical exam.  You may have blood tests done to show: ? How well your kidneys and liver are working. ? How well your blood can clot.  General instructions  Plan to have someone take you home from the hospital or clinic.  If you will be going home right after the procedure, plan to have someone with you for 24 hours.  What happens during the procedure?  Your blood pressure, heart rate, breathing, level of pain and overall condition will be monitored.  An IV  tube will be inserted into one of your veins.  Your anesthesia specialist will give you medicines as needed to keep you comfortable during the procedure. This may mean changing the level of sedation.  The procedure will be performed. After the procedure  Your blood pressure, heart rate, breathing rate, and blood oxygen level will be monitored until the medicines you were given have worn off.  Do not drive for 24 hours if you received a sedative.  You may: ? Feel sleepy, clumsy, or nauseous. ? Feel forgetful about what happened after the procedure. ? Have a sore throat if you had a breathing tube during the procedure. ? Vomit. This information is not intended to replace advice given to you by your health care provider. Make sure you discuss any questions you have with your health care provider. Document Released: 09/30/2004 Document Revised: 06/13/2015 Document Reviewed: 04/27/2015 Elsevier Interactive Patient Education  2018 [place], Care After These instructions provide you with information about caring for yourself after your procedure. Your health care provider may also give you more specific instructions. Your treatment has been planned according to current medical practices, but problems sometimes occur. Call your health care provider if you have any problems or questions after your procedure. What can I expect after the procedure? After your procedure, it is common to:  Feel sleepy for several hours.  Feel clumsy and have poor balance for several hours.  Feel forgetful about what happened after the procedure.  Have poor judgment for several hours.  Feel nauseous or vomit.  Have a sore throat if you had a breathing tube during the procedure.  Follow these instructions at home: For at least 24 hours after the procedure:   Do not: ? Participate in activities in which you could fall  or become injured. ? Drive. ? Use heavy  machinery. ? Drink alcohol. ? Take sleeping pills or medicines that cause drowsiness. ? Make important decisions or sign legal documents. ? Take care of children on your own.  Rest. Eating and drinking  Follow the diet that is recommended by your health care provider.  If you vomit, drink water, juice, or soup when you can drink without vomiting.  Make sure you have little or no nausea before eating solid foods. General instructions  Have a responsible adult stay with you until you are awake and alert.  Take over-the-counter and prescription medicines only as told by your health care provider.  If you smoke, do not smoke without supervision.  Keep all follow-up visits as told by your health care provider. This is important. Contact a health care provider if:  You keep feeling nauseous or you keep vomiting.  You feel light-headed.  You develop a rash.  You have a fever. Get help right away if:  You have trouble breathing. This information is not intended to replace advice given to you by your health care provider. Make sure you discuss any questions you have with your health care provider. Document Released: 04/27/2015 Document Revised: 08/27/2015 Document Reviewed: 04/27/2015 Elsevier Interactive Patient Education  [employer].

## 2017-09-08 ENCOUNTER — Encounter (HOSPITAL_COMMUNITY)
Admission: RE | Admit: 2017-09-08 | Discharge: 2017-09-08 | Disposition: A | Discharge status: Home / Self Care | Payer: Medicare Other | Source: Ambulatory Visit | Attending: Internal Medicine | Admitting: Internal Medicine

## 2017-09-08 ENCOUNTER — Encounter (HOSPITAL_COMMUNITY): Payer: Self-pay

## 2017-09-08 ENCOUNTER — Other Ambulatory Visit: Payer: Self-pay

## 2017-09-08 DIAGNOSIS — Z01812 Encounter for preprocedural laboratory examination: Secondary | ICD-10-CM | POA: Insufficient documentation

## 2017-09-08 LAB — BASIC METABOLIC PANEL
Anion gap: 9 (ref 5–15)
BUN: 5 mg/dL — AB (ref 6–20)
CO2: 30 mmol/L (ref 22–32)
CREATININE: 0.88 mg/dL (ref 0.44–1.00)
Calcium: 9 mg/dL (ref 8.9–10.3)
Chloride: 101 mmol/L (ref 98–111)
GFR calc non Af Amer: >60 mL/min (ref >60)
Glucose, Bld: 139 mg/dL — ABNORMAL HIGH (ref 70–99)
Potassium: 3 mmol/L — ABNORMAL LOW (ref 3.5–5.1)
Sodium: 140 mmol/L (ref 135–145)

## 2017-09-08 LAB — CBC WITH DIFFERENTIAL/PLATELET
Basophils Absolute: 0 10*3/uL (ref 0.0–0.1)
Basophils Relative: 0 %
Eosinophils Absolute: 0.2 10*3/uL (ref 0.0–0.7)
Eosinophils Relative: 4 %
HCT: 38.8 % (ref 36.0–46.0)
HEMOGLOBIN: 12.7 g/dL (ref 12.0–15.0)
LYMPHS ABS: 2.5 10*3/uL (ref 0.7–4.0)
LYMPHS PCT: 55 %
MCH: 31.3 pg (ref 26.0–34.0)
MCHC: 32.7 g/dL (ref 30.0–36.0)
MCV: 95.6 fL (ref 78.0–100.0)
MONOS PCT: 3 %
Monocytes Absolute: 0.1 10*3/uL (ref 0.1–1.0)
NEUTROS ABS: 1.7 10*3/uL (ref 1.7–7.7)
NEUTROS PCT: 38 %
Platelets: 263 10*3/uL (ref 150–400)
RBC: 4.06 MIL/uL (ref 3.87–5.11)
RDW: 12.5 % (ref 11.5–15.5)
WBC: 4.5 10*3/uL (ref 4.0–10.5)

## 2017-09-12 ENCOUNTER — Telehealth: Payer: Self-pay | Admitting: *Deleted

## 2017-09-12 ENCOUNTER — Other Ambulatory Visit: Payer: Self-pay | Admitting: Family Medicine

## 2017-09-12 ENCOUNTER — Other Ambulatory Visit: Payer: Self-pay | Admitting: Gastroenterology

## 2017-09-12 MED ORDER — POTASSIUM CHLORIDE ER 10 MEQ PO TBCR: 40.0000 meq | EXTENDED_RELEASE_TABLET | Freq: Two times a day (BID) | ORAL | 0 refills | Status: DC

## 2017-09-12 MED ORDER — PREGABALIN 100 MG PO CAPS: 100.0000 mg | ORAL_CAPSULE | Freq: Three times a day (TID) | ORAL | 2 refills | Status: DC

## 2017-09-12 NOTE — Telephone Encounter (Signed)
Pt calls.  She was told to take lyrica 3 times daily, but was only given 30 pills.  WIll forward to MD. Mareon Robinette, Salome Spotted, [place]

## 2017-09-12 NOTE — Telephone Encounter (Signed)
30 pills with 2 refills ordered. Have previously discussed to NOT continue to use gabapentin, patient understood and was in agreement with plan.  Guadalupe Dawn MD PGY-2 Family Medicine Resident

## 2017-09-12 NOTE — Progress Notes (Signed)
Potassium is 3.0. She has chronically low potassium. She may need to be on a supplement long-term. Will defer long-term treatment to PCP. In the interim, I am sending in potassium to take BID for 4 days (starting today). She has a procedure scheduled the 29th. Please let her know. Please copy to PCP as well.

## 2017-09-14 ENCOUNTER — Other Ambulatory Visit: Payer: Self-pay | Admitting: Family Medicine

## 2017-09-14 MED ORDER — PREGABALIN 100 MG PO CAPS: 100.0000 mg | ORAL_CAPSULE | Freq: Three times a day (TID) | ORAL | 2 refills | Status: DC

## 2017-09-14 NOTE — Telephone Encounter (Signed)
Pt calls back because she has not heard about meds and is about to run out.   Pt is not taking gabapentin anymore.   If she is to take lyrica 3 times daily but is only given 30 pills she will run out in 10 days.  Even though she has 2 refills insurance will not pay for new script when the sig and #of pills rx do not match.  Will forward back to MD for clarification. [place], Salome Spotted, CMA

## 2017-09-14 NOTE — Progress Notes (Signed)
Pt informed. Nilah Belcourt, CMA  

## 2017-09-14 NOTE — Progress Notes (Signed)
Lyrica updated to have 90 pills with 2 refills for 90 day supply. Please let patient know this has been called in.  Guadalupe Dawn MD PGY-2 Family Medicine Resident

## 2017-09-15 ENCOUNTER — Encounter (HOSPITAL_COMMUNITY)
Admission: RE | Disposition: A | Discharge status: Home / Self Care | Payer: Self-pay | Source: Ambulatory Visit | Attending: Internal Medicine

## 2017-09-15 ENCOUNTER — Ambulatory Visit (HOSPITAL_COMMUNITY)
Admission: RE | Admit: 2017-09-15 | Discharge: 2017-09-15 | Disposition: A | Discharge status: Home / Self Care | Payer: Medicare Other | Source: Ambulatory Visit | Attending: Internal Medicine | Admitting: Internal Medicine

## 2017-09-15 ENCOUNTER — Encounter (HOSPITAL_COMMUNITY): Payer: Self-pay | Admitting: *Deleted

## 2017-09-15 ENCOUNTER — Ambulatory Visit (HOSPITAL_COMMUNITY): Payer: Medicare Other | Admitting: Anesthesiology

## 2017-09-15 ENCOUNTER — Other Ambulatory Visit: Payer: Self-pay

## 2017-09-15 DIAGNOSIS — I10 Essential (primary) hypertension: Secondary | ICD-10-CM | POA: Diagnosis not present

## 2017-09-15 DIAGNOSIS — M1990 Unspecified osteoarthritis, unspecified site: Secondary | ICD-10-CM | POA: Insufficient documentation

## 2017-09-15 DIAGNOSIS — J45909 Unspecified asthma, uncomplicated: Secondary | ICD-10-CM | POA: Insufficient documentation

## 2017-09-15 DIAGNOSIS — Z888 Allergy status to other drugs, medicaments and biological substances status: Secondary | ICD-10-CM | POA: Diagnosis not present

## 2017-09-15 DIAGNOSIS — K5909 Other constipation: Secondary | ICD-10-CM | POA: Insufficient documentation

## 2017-09-15 DIAGNOSIS — K921 Melena: Secondary | ICD-10-CM | POA: Insufficient documentation

## 2017-09-15 DIAGNOSIS — Z79899 Other long term (current) drug therapy: Secondary | ICD-10-CM | POA: Insufficient documentation

## 2017-09-15 DIAGNOSIS — Z87891 Personal history of nicotine dependence: Secondary | ICD-10-CM | POA: Insufficient documentation

## 2017-09-15 DIAGNOSIS — K219 Gastro-esophageal reflux disease without esophagitis: Secondary | ICD-10-CM | POA: Diagnosis not present

## 2017-09-15 DIAGNOSIS — F329 Major depressive disorder, single episode, unspecified: Secondary | ICD-10-CM | POA: Diagnosis not present

## 2017-09-15 DIAGNOSIS — F419 Anxiety disorder, unspecified: Secondary | ICD-10-CM | POA: Insufficient documentation

## 2017-09-15 DIAGNOSIS — K642 Third degree hemorrhoids: Secondary | ICD-10-CM | POA: Diagnosis not present

## 2017-09-15 DIAGNOSIS — Z8249 Family history of ischemic heart disease and other diseases of the circulatory system: Secondary | ICD-10-CM | POA: Diagnosis not present

## 2017-09-15 DIAGNOSIS — K625 Hemorrhage of anus and rectum: Secondary | ICD-10-CM

## 2017-09-15 HISTORY — PX: COLONOSCOPY WITH PROPOFOL: SHX5780

## 2017-09-15 SURGERY — COLONOSCOPY WITH PROPOFOL
Anesthesia: General

## 2017-09-15 MED ORDER — LACTATED RINGERS IV SOLN
INTRAVENOUS | Status: DC
  Administered 2017-09-15: 07:00:00 via INTRAVENOUS

## 2017-09-15 MED ORDER — PROPOFOL 10 MG/ML IV BOLUS
INTRAVENOUS | Status: DC | PRN
  Administered 2017-09-15: 60 mg via INTRAVENOUS

## 2017-09-15 MED ORDER — CHLORHEXIDINE GLUCONATE CLOTH 2 % EX PADS: 6.0000 | MEDICATED_PAD | Freq: Once | CUTANEOUS | Status: DC

## 2017-09-15 MED ORDER — FENTANYL CITRATE (PF) 100 MCG/2ML IJ SOLN: 25.0000 ug | INTRAMUSCULAR | Status: DC | PRN

## 2017-09-15 MED ORDER — PROPOFOL 500 MG/50ML IV EMUL
INTRAVENOUS | Status: DC | PRN
  Administered 2017-09-15: 150 ug/kg/min via INTRAVENOUS

## 2017-09-15 MED ORDER — HYDROCODONE-ACETAMINOPHEN 7.5-325 MG PO TABS: 1.0000 | ORAL_TABLET | Freq: Once | ORAL | Status: DC | PRN

## 2017-09-15 MED ORDER — PROPOFOL 10 MG/ML IV BOLUS
INTRAVENOUS | Status: AC
  Filled 2017-09-15: qty 120

## 2017-09-15 NOTE — Anesthesia Procedure Notes (Signed)
Procedure Name: MAC Date/Time: 09/15/2017 7:25 AM Performed by: Vista Deck, CRNA Pre-anesthesia Checklist: Patient identified, Emergency Drugs available, Suction available, Timeout performed and Patient being monitored Patient Re-evaluated:Patient Re-evaluated prior to induction Oxygen Delivery Method: Nasal Cannula

## 2017-09-15 NOTE — Anesthesia Preprocedure Evaluation (Signed)
Anesthesia Evaluation  Patient identified by MRN, date of birth, ID band Patient awake    Reviewed: Allergy & Precautions, NPO status , Patient's Chart, lab work & pertinent test results  Airway Mallampati: III  TM Distance: >3 FB Neck ROM: Full  Mouth opening: Limited Mouth Opening  Dental no notable dental hx. (+) Edentulous Upper, Poor Dentition One lower tooth :   Pulmonary neg pulmonary ROS, shortness of breath, asthma , former smoker,  Denied OSA when questioned  States last inhalers 2 months ago   Pulmonary exam normal breath sounds clear to auscultation       Cardiovascular Exercise Tolerance: Good hypertension, negative cardio ROS Normal cardiovascular examI Rhythm:Regular Rate:Normal     Neuro/Psych Anxiety Depression  Neuromuscular disease negative neurological ROS  negative psych ROS   GI/Hepatic negative GI ROS, Neg liver ROS, GERD  Medicated and Controlled,  Endo/Other  negative endocrine ROS  Renal/GU negative Renal ROS  negative genitourinary   Musculoskeletal negative musculoskeletal ROS (+) Arthritis , Osteoarthritis,    Abdominal   Peds negative pediatric ROS (+)  Hematology negative hematology ROS (+)   Anesthesia Other Findings   Reproductive/Obstetrics negative OB ROS                             Anesthesia Physical Anesthesia Plan  ASA: II  Anesthesia Plan: General   Post-op Pain Management:    Induction: Intravenous  PONV Risk Score and Plan:   Airway Management Planned: Simple Face Mask and Nasal Cannula  Additional Equipment:   Intra-op Plan:   Post-operative Plan:   Informed Consent: I have reviewed the patients History and Physical, chart, labs and discussed the procedure including the risks, benefits and alternatives for the proposed anesthesia with the patient or authorized representative who has indicated his/her understanding and acceptance.    Dental advisory given  Plan Discussed with: CRNA  Anesthesia Plan Comments:         Anesthesia Quick Evaluation

## 2017-09-15 NOTE — Anesthesia Postprocedure Evaluation (Signed)
Anesthesia Post Note  Patient: [name]  Procedure(s) Performed: COLONOSCOPY WITH PROPOFOL (N/A )  Patient location during evaluation: PACU Anesthesia Type: General Level of consciousness: awake and alert and patient cooperative Pain management: satisfactory to patient Vital Signs Assessment: post-procedure vital signs reviewed and stable Respiratory status: spontaneous breathing Cardiovascular status: stable Postop Assessment: no apparent nausea or vomiting Anesthetic complications: no     Last Vitals:  Vitals:   09/15/17 0705 09/15/17 0720  BP: (!) 132/59 138/66  Pulse:    Resp: 16 20  Temp:    SpO2: 92% 94%    Last Pain:  Vitals:   09/15/17 0729  TempSrc:   PainSc: 0-No pain                 [name]

## 2017-09-15 NOTE — H&P (Addendum)
@LOGO @   Primary Care Physician:  Guadalupe Dawn, MD Primary Gastroenterologist:  Dr. Gala Romney  Pre-Procedure History & Physical: HPI:  [name] is a 57 y.o. female here for  further evaluation of rectal bleeding via Colonoscopy.  History of chronic constipation.   Past Medical History:  Diagnosis Date  . Anxiety   . Asthma    every now and then.  Wheezing and coughing  . Cancer ([place]) 1997   left lung  . Chronic abdominal pain   . Chronic back pain   . Chronic joint pain   . Depression   . GERD (gastroesophageal reflux disease)   . HLD (hyperlipidemia)   . HTN (hypertension)   . Nausea and vomiting    chronic, recurrent  . Osteoarthrosis involving more than one site but not generalized   . Palpitations 01/2013   chronic, intermittent  . Shortness of breath dyspnea    with exertion    Past Surgical History:  Procedure Laterality Date  . ABDOMINAL HYSTERECTOMY    . ABDOMINAL SURGERY     ovarian cyst removal  . BIOPSY N/A 11/22/2013   Procedure: GASTRIC BIOPSY;  Surgeon: Daneil Dolin, MD;  Location: [facility];  Service: Endoscopy;  Laterality: N/A;  . BLADDER SURGERY  suspension x4   x 4  . CARPAL TUNNEL RELEASE Right 07/11/2015   Procedure: RIGHT CARPAL TUNNEL RELEASE;  Surgeon: Jessy Oto, MD;  Location: [place];  Service: Orthopedics;  Laterality: Right;  . CHOLECYSTECTOMY N/A 04/05/2014   Procedure: LAPAROSCOPIC CHOLECYSTECTOMY;  Surgeon: Aviva Signs Md, MD;  Location: [facility];  Service: General;  Laterality: N/A;  . COCCYGECTOMY N/A 04/11/2015   Procedure: COCCYGECTOMY WITH OSTEOTOMY THROUGH AREA OF DEFORMITY;  Surgeon: Jessy Oto, MD;  Location: [place];  Service: Orthopedics;  Laterality: N/A;  . COLONOSCOPY    . COLONOSCOPY WITH PROPOFOL N/A 11/22/2013   Dr. Gala Romney: Grade 3 and 4/internal hemorrhoids?"likely source of hematochezia Normal colonoscopy otherwise.   . ESOPHAGOGASTRODUODENOSCOPY (EGD) WITH PROPOFOL N/A 11/22/2013   Dr. Gala Romney:  Incomplete Schatzki's ring dilated and disrupted as described above.  Small hiatal hernia. Focally abnormal gastric mucosa of uncertain significance status post biopsy, reactive gastropathy, negative H.pylori  . ESOPHAGOGASTRODUODENOSCOPY (EGD) WITH PROPOFOL N/A 08/04/2015   Dr. Gala Romney: mild Schatzki's ring s/p dilation, small hiatal hernia   . ESOPHAGOGASTRODUODENOSCOPY (EGD) WITH PROPOFOL N/A 03/17/2017   Mild Schatki's ring s/p dilation, small hiatal hernia, otherwise normal  . EXCISION VAGINAL CYST Left 06/20/2012   Procedure: EXCISION LEFT LABIAL CYST;  Surgeon: Jonnie Kind, MD;  Location: [facility];  Service: Gynecology;  Laterality: Left;  . LOBECTOMY Left   . LUNG CANCER SURGERY  1997   age 82, resection only  . MALONEY DILATION N/A 11/22/2013   Procedure: [name] DILATION;  Surgeon: Daneil Dolin, MD;  Location: [facility];  Service: Endoscopy;  Laterality: N/A;  56  . MALONEY DILATION N/A 08/04/2015   Procedure: [name] DILATION;  Surgeon: Daneil Dolin, MD;  [facility];  Service: Endoscopy;  Laterality: N/A;  [name] DILATION N/A 03/17/2017   Procedure: [name] DILATION;  Surgeon: Daneil Dolin, MD;  [facility];  Service: Endoscopy;  Laterality: N/A;  . MASS EXCISION N/A 07/23/2014   Procedure: EXCISIONAL BIOPSY NODULE LEFT PARACOCCYGEAL AREA;  Surgeon: Jessy Oto, MD;  Location: [place];  Service: Orthopedics;  Laterality: N/A;  . MASS EXCISION Left 10/10/2015   Procedure: EXCISIONAL BIOPSY OF LEFT DORSORADIAL  FOREARM MASS LIPOMA;  Surgeon: Jessy Oto, MD;  Location: [place];  Service: Orthopedics;  Laterality: Left;  . TUBAL LIGATION      Prior to Admission medications   Medication Sig Start Date End Date Taking? Authorizing Provider  acyclovir (ZOVIRAX) 400 MG tablet TAKE 1 TABLET BY MOUTH TWICE DAILY Patient taking differently: Take 400 mg by mouth 2 (two) times daily.  05/30/17  Yes Verner Mould, MD  albuterol [facility] HFA)  [phone] Base) MCG/ACT inhaler Inhale 2 puffs into the lungs every 6 (six) hours as needed for wheezing or shortness of breath. 11/02/16  Yes Verner Mould, MD  amitriptyline (ELAVIL) 150 MG tablet TAKE 1 TABLET BY MOUTH ONCE DAILY AT NIGHT Patient taking differently: Take 150 mg by mouth at bedtime.  05/10/17  Yes Riccio, Angela C, DO  atorvastatin (LIPITOR) 40 MG tablet Take 1 tablet (40 mg total) by mouth daily. 02/16/17  Yes Verner Mould, MD  baclofen (LIORESAL) 10 MG tablet Take 1 tablet (10 mg total) by mouth as needed. Patient taking differently: Take 10 mg by mouth daily as needed for muscle spasms.  08/18/17  Yes Guadalupe Dawn, MD  dexlansoprazole [facility]) 60 MG capsule Take 1 capsule (60 mg total) by mouth daily before breakfast. 09/14/17  Yes Mahala Menghini, PA-C  diclofenac sodium (VOLTAREN) 1 % GEL Apply 4 g topically 4 (four) times daily. Patient taking differently: Apply 4 g topically 4 (four) times daily as needed (pain).  08/18/17  Yes Guadalupe Dawn, MD  DULoxetine (CYMBALTA) 60 MG capsule Take 1 capsule (60 mg total) by mouth 2 (two) times daily. 04/19/17  Yes Verner Mould, MD  hydrochlorothiazide (HYDRODIURIL) 25 MG tablet Take 1 tablet (25 mg total) by mouth daily. 08/29/17  Yes Guadalupe Dawn, MD  lubiprostone (AMITIZA) 24 MCG capsule Take 1 capsule (24 mcg total) by mouth 2 (two) times daily with a meal. 07/15/17  Yes Annitta Needs, NP  meloxicam (MOBIC) 7.5 MG tablet Take 2 tablets (15 mg total) by mouth daily. 08/11/17  Yes Noemi Chapel, MD  Multiple Vitamins-Minerals (HAIR SKIN AND NAILS FORMULA) TABS Take 2 tablets by mouth daily.   Yes [provider]  potassium chloride (K-DUR) 10 MEQ tablet Take 4 tablets (40 mEq total) by mouth 2 (two) times daily for 4 days. 09/12/17 09/16/17 Yes Annitta Needs, NP  pregabalin (LYRICA) 100 MG capsule Take 1 capsule (100 mg total) by mouth 3 (three) times daily. 09/14/17  Yes Guadalupe Dawn, MD   hydrocortisone (ANUSOL-HC) 2.5 % rectal cream Place 1 application rectally 2 (two) times daily. Patient not taking: Reported on 09/02/2017 07/15/17   Annitta Needs, NP  predniSONE (DELTASONE) 20 MG tablet Take 2 tablets (40 mg total) by mouth daily. Patient not taking: Reported on 09/02/2017 08/11/17   Noemi Chapel, MD    Allergies as of 07/15/2017 - Review Complete 07/15/2017  Allergen Reaction Noted  . Promethazine hcl Nausea And Vomiting and Other (See Comments) 07/26/2008    Family History  Problem Relation Age of Onset  . Diabetes Other   . Heart disease Other        has Psychologist, forensic  . Hypertension Mother   . Kidney disease Mother   . Hypertension Father   . Kidney disease Father   . Heart disease Father   . Cancer Father        leukemia  . Hypertension Sister   . Hypertension Sister   .  Colon cancer Neg Hx     Social History   Socioeconomic History  . Marital status: Married    Spouse name: Not on file  . Number of children: 3  . Years of education: Not on file  . Highest education level: Not on file  Occupational History  . Not on file  Social Needs  . Financial resource strain: Not on file  . Food insecurity:    Worry: Not on file    Inability: Not on file  . Transportation needs:    Medical: Not on file    Non-medical: Not on file  Tobacco Use  . Smoking status: Former Smoker    Packs/day: 1.50    Years: 25.00    Pack years: 37.50    Types: Cigarettes    Last attempt to quit: 04/07/1995    Years since quitting: 22.4  . Smokeless tobacco: Never Used  Substance and Sexual Activity  . Alcohol use: No  . Drug use: No  . Sexual activity: Not Currently    Birth control/protection: Surgical  Lifestyle  . Physical activity:    Days per week: Not on file    Minutes per session: Not on file  . Stress: Not on file  Relationships  . Social connections:    Talks on phone: Not on file    Gets together: Not on file    Attends religious service: Not on file     Active member of club or organization: Not on file    Attends meetings of clubs or organizations: Not on file    Relationship status: Not on file  . Intimate partner violence:    Fear of current or ex partner: Not on file    Emotionally abused: Not on file    Physically abused: Not on file    Forced sexual activity: Not on file  Other Topics Concern  . Not on file  Social History Narrative  . Not on file    Review of Systems: See HPI, otherwise negative ROS  Physical Exam: Pulse 80   Temp 98.2 F (36.8 C) (Oral)   Resp 19   Ht 5\' 5"  (1.651 m)   Wt 80.7 kg   SpO2 91%   BMI 29.62 kg/m  General:   Alert,  Well-developed, well-nourished, pleasant and cooperative in NAD  Heart:  Regular rate and rhythm; no murmurs, clicks, rubs,  or gallops. Abdomen: Non-distended, normal bowel sounds.  Soft and nontender without appreciable mass or hepatosplenomegaly.  Pulses:  Normal pulses noted. Extremities:  Without clubbing or edema.  Impression/Plan:  57 year old lady here for colonoscopy to further evaluate rectal bleeding.The risks, benefits, limitations, alternatives and imponderables have been reviewed with the patient. Questions have been answered. All parties are agreeable.      Notice: This dictation was prepared with Dragon dictation along with smaller phrase technology. Any transcriptional errors that result from this process are unintentional and may not be corrected upon review.

## 2017-09-15 NOTE — Transfer of Care (Signed)
Immediate Anesthesia Transfer of Care Note  Patient: [name]  Procedure(s) Performed: COLONOSCOPY WITH PROPOFOL (N/A )  Patient Location: PACU  Anesthesia Type:General  Level of Consciousness: awake, alert  and patient cooperative  Airway & Oxygen Therapy: Patient Spontanous Breathing  Post-op Assessment: Report given to RN and Post -op Vital signs reviewed and stable  Post vital signs: Reviewed and stable  Last Vitals:  Vitals Value Taken Time  BP    Temp    Pulse 55 09/15/2017  7:48 AM  Resp    SpO2 78 % 09/15/2017  7:48 AM  Vitals shown include unvalidated device data.  Last Pain:  Vitals:   09/15/17 0729  TempSrc:   PainSc: 0-No pain      Patients Stated Pain Goal: 10 (73/22/02 5427)  Complications: No apparent anesthesia complications

## 2017-09-15 NOTE — Op Note (Signed)
[facility] Patient Name: [name] Procedure Date: 09/15/2017 7:27 AM MRN: [id number] Date of Birth: March 20, 1960 Attending MD: Norvel Richards , MD CSN: [id number] Age: 57 Admit Type: Outpatient Procedure:                Colonoscopy Indications:              Hematochezia Providers:                Norvel Richards, MD, Jeanann Lewandowsky. Sharon Seller, RN,                            Nelma Rothman, Technician Referring MD:              Medicines:                Propofol per Anesthesia Complications:            No immediate complications. Estimated Blood Loss:     Estimated blood loss: none. Procedure:                Pre-Anesthesia Assessment:                           - Prior to the procedure, a History and Physical                            was performed, and patient medications and                            allergies were reviewed. The patient's tolerance of                            previous anesthesia was also reviewed. The risks                            and benefits of the procedure and the sedation                            options and risks were discussed with the patient.                            All questions were answered, and informed consent                            was obtained. Prior Anticoagulants: The patient has                            taken no previous anticoagulant or antiplatelet                            agents. ASA Grade Assessment: II - A patient with                            mild systemic disease. After reviewing the risks  and benefits, the patient was deemed in                            satisfactory condition to undergo the procedure.                           After obtaining informed consent, the colonoscope                            was passed under direct vision. Throughout the                            procedure, the patient's blood pressure, pulse, and                            oxygen saturations were monitored  continuously. The                            CF-[id number] ([id number]) scope was introduced through                            the and advanced to the the cecum, identified by                            appendiceal orifice and ileocecal valve. The                            colonoscopy was performed without difficulty. The                            patient tolerated the procedure well. The quality                            of the bowel preparation was adequate. The entire                            colon was well visualized. Scope In: 7:32:37 AM Scope Out: 7:43:28 AM Scope Withdrawal Time: 0 hours 7 minutes 2 seconds  Total Procedure Duration: 0 hours 10 minutes 51 seconds  Findings:      Hemorrhoids were found on perianal exam.      External and internal hemorrhoids were found during retroflexion. The       hemorrhoids were Grade III (internal hemorrhoids that prolapse but       require manual reduction).      The exam was otherwise without abnormality on direct and retroflexion       views. Impression:               - Hemorrhoids found on perianal exam.                           - External and internal hemorrhoids.                           - The examination was otherwise normal on direct  and retroflexion views.                           - No specimens collected. I suspect bleeding from                            hemorrhoids. patient may well be a good hemorrhoid                            banding candidate. Moderate Sedation:      Moderate (conscious) sedation was personally administered by an       anesthesia professional. The following parameters were monitored: oxygen       saturation, heart rate, blood pressure, respiratory rate, EKG, adequacy       of pulmonary ventilation, and response to care. Total physician       intraservice time was 15 minutes. Recommendation:           - Patient has a contact number available for                             emergencies. The signs and symptoms of potential                            delayed complications were discussed with the                            patient. Return to normal activities tomorrow.                            Written discharge instructions were provided to the                            patient.                           - Resume previous diet.                           - Continue present medications.                           - Repeat colonoscopy in 10 years for screening                            purposes.                           - Return to GI office in 8 weeks. pamphlet on                            hemorrhoid banding provided. Procedure Code(s):        --- Professional ---                           [phone], Colonoscopy, flexible; diagnostic, including  collection of specimen(s) by brushing or washing,                            when performed (separate procedure) Diagnosis Code(s):        --- Professional ---                           K64.2, Third degree hemorrhoids                           K92.1, Melena (includes Hematochezia) CPT copyright 2017 American Medical Association. All rights reserved. The codes documented in this report are preliminary and upon coder review may  be revised to meet current compliance requirements. [name]. Virgie Chery, MD Norvel Richards, MD 09/15/2017 7:54:57 AM This report has been signed electronically. Number of Addenda: 0

## 2017-09-15 NOTE — Discharge Instructions (Signed)
Colonoscopy Discharge Instructions  Read the instructions outlined below and refer to this sheet in the next few weeks. These discharge instructions provide you with general information on caring for yourself after you leave the hospital. Your doctor may also give you specific instructions. While your treatment has been planned according to the most current medical practices available, unavoidable complications occasionally occur. If you have any problems or questions after discharge, call Dr. Gala Romney at [phone]. ACTIVITY  You may resume your regular activity, but move at a slower pace for the next 24 hours.   Take frequent rest periods for the next 24 hours.   Walking will help get rid of the air and reduce the bloated feeling in your belly (abdomen).   No driving for 24 hours (because of the medicine (anesthesia) used during the test).    Do not sign any important legal documents or operate any machinery for 24 hours (because of the anesthesia used during the test).  NUTRITION  Drink plenty of fluids.   You may resume your normal diet as instructed by your doctor.   Begin with a light meal and progress to your normal diet. Heavy or fried foods are harder to digest and may make you feel sick to your stomach (nauseated).   Avoid alcoholic beverages for 24 hours or as instructed.  MEDICATIONS  You may resume your normal medications unless your doctor tells you otherwise.  WHAT YOU CAN EXPECT TODAY  Some feelings of bloating in the abdomen.   Passage of more gas than usual.   Spotting of blood in your stool or on the toilet paper.  IF YOU HAD POLYPS REMOVED DURING THE COLONOSCOPY:  No aspirin products for 7 days or as instructed.   No alcohol for 7 days or as instructed.   Eat a soft diet for the next 24 hours.  FINDING OUT THE RESULTS OF YOUR TEST Not all test results are available during your visit. If your test results are not back during the visit, make an appointment  with your caregiver to find out the results. Do not assume everything is normal if you have not heard from your caregiver or the medical facility. It is important for you to follow up on all of your test results.  SEEK IMMEDIATE MEDICAL ATTENTION IF:  You have more than a spotting of blood in your stool.   Your belly is swollen (abdominal distention).   You are nauseated or vomiting.   You have a temperature over 101.   You have abdominal pain or discomfort that is severe or gets worse throughout the day.    Repeat screening colonoscopy in 10 years  Hemorrhoid information and hemorrhoid banding pamphlet information provided  Office visit in 6-8 weeks for hemorrhoid banding (AB)  PATIENT INSTRUCTIONS POST-ANESTHESIA  IMMEDIATELY FOLLOWING SURGERY:  Do not drive or operate machinery for the first twenty four hours after surgery.  Do not make any important decisions for twenty four hours after surgery or while taking narcotic pain medications or sedatives.  If you develop intractable nausea and vomiting or a severe headache please notify your doctor immediately.  FOLLOW-UP:  Please make an appointment with your surgeon as instructed. You do not need to follow up with anesthesia unless specifically instructed to do so.  WOUND CARE INSTRUCTIONS (if applicable):  Keep a dry clean dressing on the anesthesia/puncture wound site if there is drainage.  Once the wound has quit draining you may leave it open to air.  Generally  you should leave the bandage intact for twenty four hours unless there is drainage.  If the epidural site drains for more than 36-48 hours please call the anesthesia department.  QUESTIONS?:  Please feel free to call your physician or the hospital operator if you have any questions, and they will be happy to assist you.      Hemorrhoids Hemorrhoids are swollen veins in and around the rectum or anus. Hemorrhoids can cause pain, itching, or bleeding. Most of the time, they  do not cause serious problems. They usually get better with diet changes, lifestyle changes, and other home treatments. Follow these instructions at home: Eating and drinking  Eat foods that have fiber, such as whole grains, beans, nuts, fruits, and vegetables. Ask your doctor about taking products that have added fiber (fibersupplements).  Drink enough fluid to keep your pee (urine) clear or pale yellow. For Pain and Swelling  Take a warm-water bath (sitz bath) for 20 minutes to ease pain. Do this 3-4 times a day.  If directed, put ice on the painful area. It may be helpful to use ice between your warm baths. ? Put ice in a plastic bag. ? Place a towel between your skin and the bag. ? Leave the ice on for 20 minutes, 2-3 times a day. General instructions  Take over-the-counter and prescription medicines only as told by your doctor. ? Medicated creams and medicines that are inserted into the anus (suppositories) may be used or applied as told.  Exercise often.  Go to the bathroom when you have the urge to poop (to have a bowel movement). Do not wait.  Avoid pushing too hard (straining) when you poop.  Keep the butt area dry and clean. Use wet toilet paper or moist paper towels.  Do not sit on the toilet for a long time. Contact a doctor if:  You have any of these: ? Pain and swelling that do not get better with treatment or medicine. ? Bleeding that will not stop. ? Trouble pooping or you cannot poop. ? Pain or swelling outside the area of the hemorrhoids. This information is not intended to replace advice given to you by your health care provider. Make sure you discuss any questions you have with your health care provider. Document Released: 10/14/2007 Document Revised: 06/12/2015 Document Reviewed: 09/18/2014 Elsevier Interactive Patient Education  [employer].

## 2017-09-20 ENCOUNTER — Encounter (HOSPITAL_COMMUNITY): Payer: Self-pay | Admitting: Internal Medicine

## 2017-09-23 ENCOUNTER — Ambulatory Visit (INDEPENDENT_AMBULATORY_CARE_PROVIDER_SITE_OTHER): Payer: Medicare Other

## 2017-09-23 ENCOUNTER — Ambulatory Visit (INDEPENDENT_AMBULATORY_CARE_PROVIDER_SITE_OTHER): Payer: Medicare Other | Admitting: Specialist

## 2017-09-23 ENCOUNTER — Encounter (INDEPENDENT_AMBULATORY_CARE_PROVIDER_SITE_OTHER): Payer: Self-pay | Admitting: Specialist

## 2017-09-23 VITALS — BP 152/68 | HR 75 | Ht 64.0 in | Wt 180.0 lb

## 2017-09-23 DIAGNOSIS — M545 Low back pain: Secondary | ICD-10-CM

## 2017-09-23 DIAGNOSIS — S76012A Strain of muscle, fascia and tendon of left hip, initial encounter: Secondary | ICD-10-CM | POA: Diagnosis not present

## 2017-09-23 DIAGNOSIS — M2170 Unequal limb length (acquired), unspecified site: Secondary | ICD-10-CM

## 2017-09-23 NOTE — Progress Notes (Signed)
Office Visit Note   Patient: [name]           Date of Birth: [birth year]           MRN: [id number] Visit Date: 09/23/2017              Requested by: Guadalupe Dawn, MD [phone], [place] [address] PCP: Guadalupe Dawn, MD   Assessment & Plan: Visit Diagnoses:  1. Low back pain, unspecified back pain laterality, unspecified chronicity, with sciatica presence unspecified   2. Strain of flexor muscle of left hip, initial encounter   3. Leg length discrepancy     Plan: Avoid frequent bending and stooping  No lifting greater than 10 lbs. May use ice or moist heat for pain. Weight loss is of benefit. Best medication for lumbar disc disease is arthritis medications like motrin, celebrex and naprosyn. Exercise is important to improve your indurance and does allow people to function better inspite of back pain.  I recommend that you see a therapist to work on stretching and ROM exercises of the left hip. Start Naprosyn 375 mg 2-3 times per day. Stop gabapentin but start Lyrica.  Right shoe lift 1/4" CDB on line amazon 6,000 mg tablet daily.  Follow-Up Instructions: Return in about 4 weeks (around 10/21/2017).   Orders:  Orders Placed This Encounter  Procedures  . XR Lumbar Spine 2-3 Views   No orders of the defined types were placed in this encounter.     Procedures: No procedures performed   Clinical Data: Findings:  MRI of the left hip show a tear in the left gluteus minimus tendon and edema within the quadratus femoris, findings consistent with ischialfemoral impingment.     Subjective: Chief Complaint  Patient presents with  . Lower Back - Pain    Pain radiating into left hip and into the foot, 3rd-5th toes are numb    57 year old female with history of left hip pain and discomfort with standing and walking and movement of the left hip. History of previous lumbar fusion L3-4. Had a MRI of the left hip 08/2017 and the results show left  gluteus minimus strain and also edema within the quadratus femoris that may be secondary to ischial femoral impingement.    Review of Systems  Constitutional: Negative.   HENT: Negative.   Eyes: Negative.   Respiratory: Negative.   Cardiovascular: Negative.   Gastrointestinal: Negative.   Endocrine: Negative.   Genitourinary: Negative.   Musculoskeletal: Negative.   Skin: Negative.   Allergic/Immunologic: Negative.   Neurological: Negative.   Hematological: Negative.   Psychiatric/Behavioral: Negative.      Objective: Vital Signs: BP (!) 152/68 (BP Location: Left Arm, Patient Position: Sitting)   Pulse 75   Ht 5\' 4"  (1.626 m)   Wt 180 lb (81.6 kg)   BMI 30.90 kg/m   Physical Exam  Constitutional: She is oriented to person, place, and time. She appears well-developed and well-nourished.  HENT:  Head: Normocephalic and atraumatic.  Eyes: Pupils are equal, round, and reactive to light. EOM are normal.  Neck: Normal range of motion. Neck supple.  Pulmonary/Chest: Effort normal and breath sounds normal.  Abdominal: Soft. Bowel sounds are normal.  Neurological: She is alert and oriented to person, place, and time.  Skin: Skin is warm and dry.  Psychiatric: She has a normal mood and affect. Her behavior is normal. Judgment and thought content normal.    Left Hip Exam   Tenderness  The patient is experiencing tenderness in the posterior, lateral and ischial tuberosity.  Range of Motion  Abduction: abnormal  Adduction: abnormal  Extension: abnormal  Flexion: abnormal  External rotation: abnormal  Internal rotation: abnormal   Muscle Strength  Abduction: 5/5  Adduction: 5/5  Flexion: 5/5   Tests  FABER: negative Ober: negative  Other  Erythema: absent Scars: absent Sensation: normal Pulse: present  Comments:  Left hip ROM with pain left posterior hip.       Specialty Comments:  No specialty comments available.  Imaging: Xr Lumbar Spine 2-3  Views  Result Date: 09/23/2017 Ap and lateral flexion and extension radiographs of the lumbar spine show Fusion L3-4 with pedicle screws and rods and cage at the L3-4 level with flexion and extension there is no motion at the L3-4 level.     PMFS History: Patient Active Problem List   Diagnosis Date Noted  . Mass of left forearm 10/10/2015    Priority: High    Class: Chronic  . Carpal tunnel syndrome, right 07/11/2015    Priority: High    Class: Chronic  . Spondylolisthesis of lumbar region 01/03/2015    Priority: High    Class: Chronic  . Spinal stenosis, lumbar region, with neurogenic claudication 01/03/2015    Priority: High    Class: Chronic  . Soft tissue mass 07/23/2014    Priority: High    Class: Acute  . Dysphagia 01/20/2017  . Left hip pain 01/19/2017  . Right hand pain 01/19/2017  . Diarrhea 10/11/2016  . Right hip pain 06/07/2016  . Dysuria 04/14/2016  . Malaise and fatigue 04/14/2016  . Breast pain, left 11/06/2015  . Dysphagia, oropharyngeal   . Gastroesophageal reflux disease without esophagitis   . Prediabetes 07/09/2015  . Lumbar stenosis with neurogenic claudication 01/05/2015  . Coccygodynia 07/23/2014    Class: Acute  . Healthcare maintenance 03/19/2014  . Constipation 02/15/2014  . Dysphagia, pharyngoesophageal phase   . Elevated alkaline phosphatase level 11/06/2013  . Rectal bleeding 11/06/2013  . Abdominal pain 10/03/2013  . Obesity, unspecified 02/15/2013  . Genital herpes 10/12/2012  . HSV (herpes simplex virus) anogenital infection 09/29/2012  . UNSPECIFIED SLEEP APNEA 12/04/2008  . ADENOCARCINOMA, LUNG 10/09/2008  . Osteoarthritis of multiple joints 07/13/2006  . HYPERCHOLESTEROLEMIA 03/17/2006  . DEPRESSION, MAJOR, RECURRENT 03/17/2006  . HYPERTENSION, BENIGN SYSTEMIC 03/17/2006  . GASTROESOPHAGEAL REFLUX, NO ESOPHAGITIS 03/17/2006  . INSOMNIA NOS 03/17/2006   Past Medical History:  Diagnosis Date  . Anxiety   . Asthma    every  now and then.  Wheezing and coughing  . Cancer ([place]) 1997   left lung  . Chronic abdominal pain   . Chronic back pain   . Chronic joint pain   . Depression   . GERD (gastroesophageal reflux disease)   . HLD (hyperlipidemia)   . HTN (hypertension)   . Nausea and vomiting    chronic, recurrent  . Osteoarthrosis involving more than one site but not generalized   . Palpitations 01/2013   chronic, intermittent  . Shortness of breath dyspnea    with exertion    Family History  Problem Relation Age of Onset  . Diabetes Other   . Heart disease Other        has Psychologist, forensic  . Hypertension Mother   . Kidney disease Mother   . Hypertension Father   . Kidney disease Father   . Heart disease Father   . Cancer Father  leukemia  . Hypertension Sister   . Hypertension Sister   . Colon cancer Neg Hx     Past Surgical History:  Procedure Laterality Date  . ABDOMINAL HYSTERECTOMY    . ABDOMINAL SURGERY     ovarian cyst removal  . BIOPSY N/A 11/22/2013   Procedure: GASTRIC BIOPSY;  Surgeon: Daneil Dolin, MD;  Location: [facility];  Service: Endoscopy;  Laterality: N/A;  . BLADDER SURGERY  suspension x4   x 4  . CARPAL TUNNEL RELEASE Right 07/11/2015   Procedure: RIGHT CARPAL TUNNEL RELEASE;  Surgeon: Jessy Oto, MD;  Location: [facility];  Service: Orthopedics;  Laterality: Right;  . CHOLECYSTECTOMY N/A 04/05/2014   Procedure: LAPAROSCOPIC CHOLECYSTECTOMY;  Surgeon: Aviva Signs Md, MD;  Location: [facility];  Service: General;  Laterality: N/A;  . COCCYGECTOMY N/A 04/11/2015   Procedure: COCCYGECTOMY WITH OSTEOTOMY THROUGH AREA OF DEFORMITY;  Surgeon: Jessy Oto, MD;  Location: [facility];  Service: Orthopedics;  Laterality: N/A;  . COLONOSCOPY    . COLONOSCOPY WITH PROPOFOL N/A 11/22/2013   Dr. Gala Romney: Grade 3 and 4/internal hemorrhoids?"likely source of hematochezia Normal colonoscopy otherwise.   Marland Kitchen COLONOSCOPY WITH PROPOFOL N/A 09/15/2017   Procedure: COLONOSCOPY WITH  PROPOFOL;  Surgeon: Daneil Dolin, MD;  [facility];  Service: Endoscopy;  Laterality: N/A;  7:30am  . ESOPHAGOGASTRODUODENOSCOPY (EGD) WITH PROPOFOL N/A 11/22/2013   Dr. Gala Romney: Incomplete Schatzki's ring dilated and disrupted as described above.  Small hiatal hernia. Focally abnormal gastric mucosa of uncertain significance status post biopsy, reactive gastropathy, negative H.pylori  . ESOPHAGOGASTRODUODENOSCOPY (EGD) WITH PROPOFOL N/A 08/04/2015   Dr. Gala Romney: mild Schatzki's ring s/p dilation, small hiatal hernia   . ESOPHAGOGASTRODUODENOSCOPY (EGD) WITH PROPOFOL N/A 03/17/2017   Mild Schatki's ring s/p dilation, small hiatal hernia, otherwise normal  . EXCISION VAGINAL CYST Left 06/20/2012   Procedure: EXCISION LEFT LABIAL CYST;  Surgeon: Jonnie Kind, MD;  Location: [facility];  Service: Gynecology;  Laterality: Left;  . LOBECTOMY Left   . LUNG CANCER SURGERY  1997   age 4, resection only  . MALONEY DILATION N/A 11/22/2013   Procedure: [name] DILATION;  Surgeon: Daneil Dolin, MD;  Location: [facility];  Service: Endoscopy;  Laterality: N/A;  56  . MALONEY DILATION N/A 08/04/2015   Procedure: [name] DILATION;  Surgeon: Daneil Dolin, MD;  [facility];  Service: Endoscopy;  Laterality: N/A;  [name] DILATION N/A 03/17/2017   Procedure: [name] DILATION;  Surgeon: Daneil Dolin, MD;  [facility];  Service: Endoscopy;  Laterality: N/A;  . MASS EXCISION N/A 07/23/2014   Procedure: EXCISIONAL BIOPSY NODULE LEFT PARACOCCYGEAL AREA;  Surgeon: Jessy Oto, MD;  Location: [facility];  Service: Orthopedics;  Laterality: N/A;  . MASS EXCISION Left 10/10/2015   Procedure: EXCISIONAL BIOPSY OF LEFT DORSORADIAL FOREARM MASS LIPOMA;  Surgeon: Jessy Oto, MD;  Location: [facility];  Service: Orthopedics;  Laterality: Left;  . TUBAL LIGATION     Social History   Occupational History  . Not on file  Tobacco Use  . Smoking status: Former Smoker     Packs/day: 1.50    Years: 25.00    Pack years: 37.50    Types: Cigarettes    Last attempt to quit: 04/07/1995    Years since quitting: 22.4  . Smokeless tobacco: Never Used  Substance and Sexual Activity  . Alcohol use: No  . Drug use: No  . Sexual  activity: Not Currently    Birth control/protection: Surgical

## 2017-09-23 NOTE — Patient Instructions (Addendum)
Avoid frequent bending and stooping  No lifting greater than 10 lbs. May use ice or moist heat for pain. Weight loss is of benefit. Best medication for lumbar disc disease is arthritis medications like motrin, celebrex and naprosyn. Exercise is important to improve your indurance and does allow people to function better inspite of back pain.  I recommend that you see a therapist to work on stretching and ROM exercises of the left hip. Start Naprosyn 375 mg 2-3 times per day. Stop gabapentin but start Lyrica.  Right shoe lift 1/4"  CDB on line amazon 6,000 mg tablet daily.

## 2017-09-24 IMAGING — RF DG ESOPHAGUS
10 of 14 series · 14 of 21 positions shown · non-contrast
Comparison: No priors.

CLINICAL DATA: 55-year-old female with history of dysphagia,
particularly with ingestion of solids, over the past 2 months.
Sensation of things getting stuck in the esophagus. Symptoms
progressively worsening.

EXAM:
ESOPHOGRAM / BARIUM SWALLOW / BARIUM TABLET STUDY
TECHNIQUE: Combined double contrast and single contrast examination performed
using effervescent crystals, thick barium liquid, and thin barium
liquid. The patient was observed with fluoroscopy swallowing a 13 mm
barium sulphate tablet.
FLUOROSCOPY TIME:  Fluoroscopy Time:  2 minutes and 18 seconds.

[Series 1: fluoro_barium 2fps_bw · 0.17mm/px · 1 of 1 slices shown (1 of 5)]
[im 1/1]
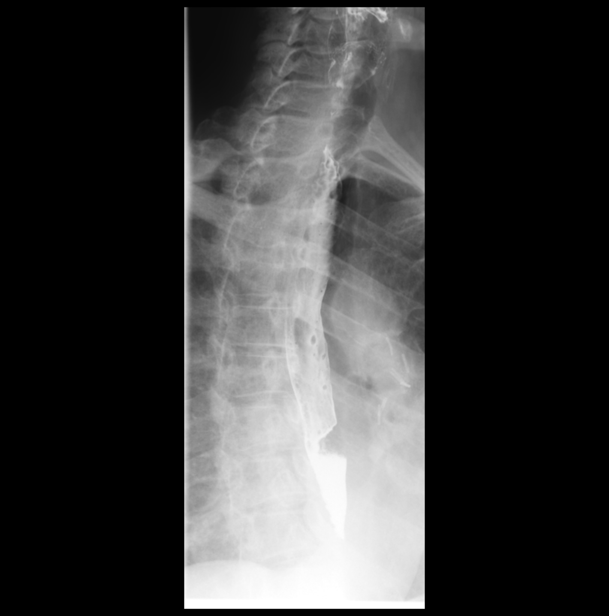

[Series 3: fluoro_barium 2fps_bw · 0.17mm/px · 1 of 1 slices shown (2 of 5)]
[im 1/1]
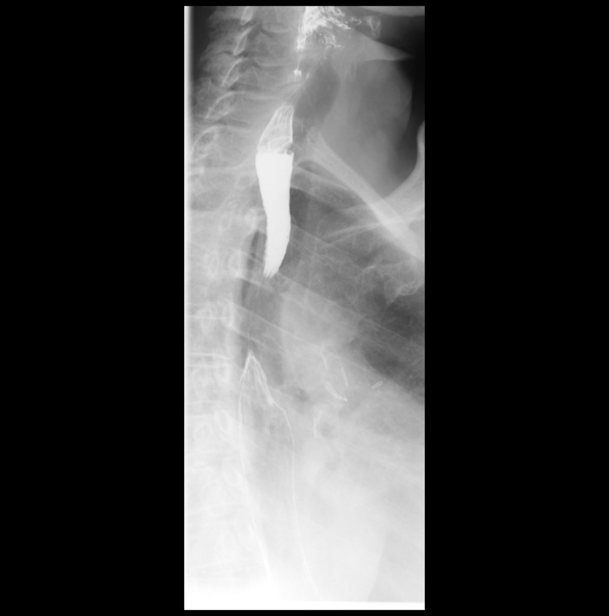

[Series 4: fluoro_barium 2fps_bw · 0.17mm/px · 1 of 1 slices shown (3 of 5)]
[im 1/1]
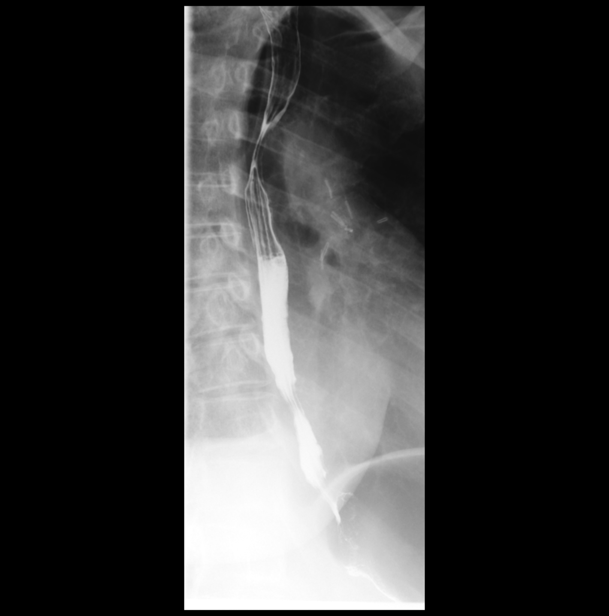

[Series 6: cp_standard · 0.27mm/px · 2 of 32 frames shown (1 of 5)]
[frame 5/32]
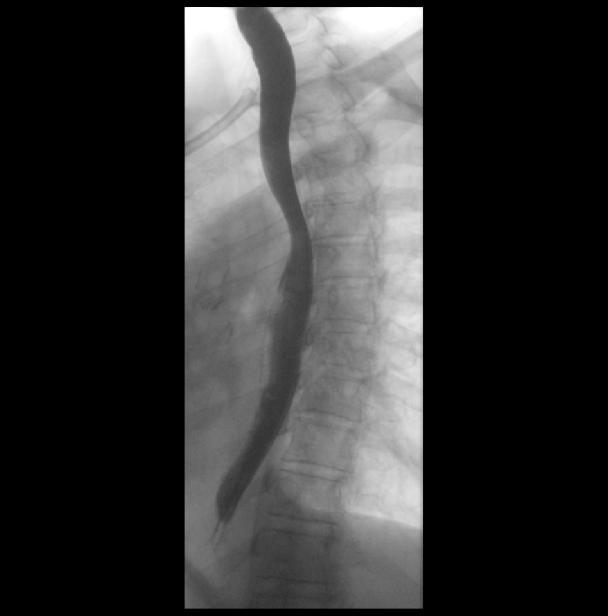
[frame 17/32]
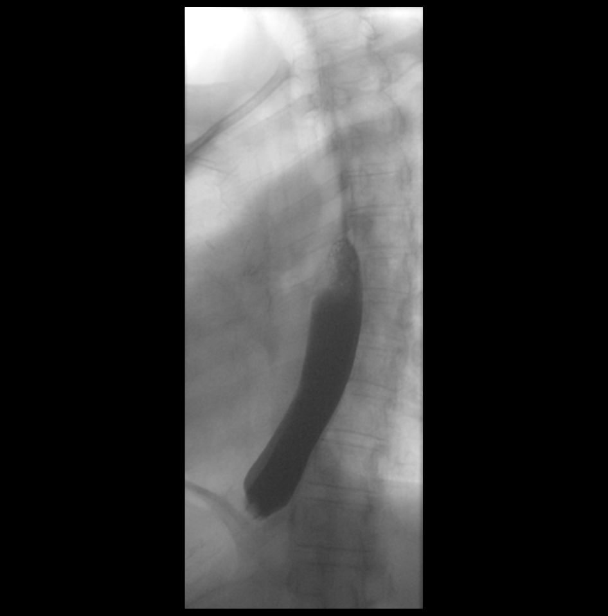

[Series 7: cp_standard · 0.28mm/px · 2 of 46 frames shown (2 of 5)]
[frame 7/46]
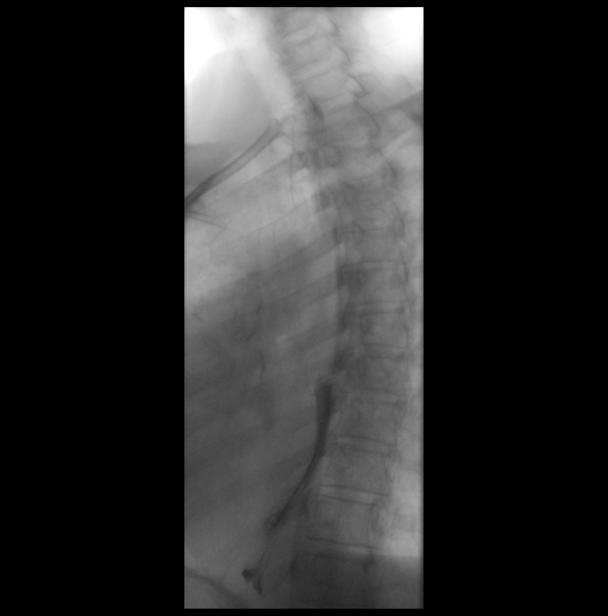
[frame 24/46]
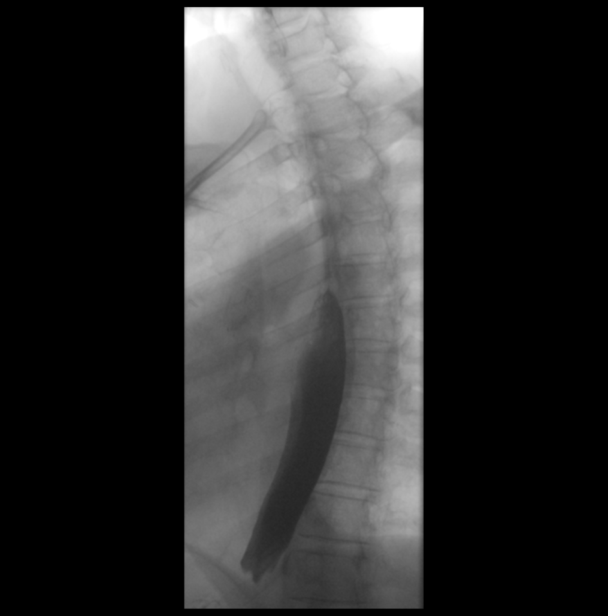

[Series 8: cp_standard · 0.28mm/px · 3 of 38 frames shown (3 of 5)]
[frame 6/38]
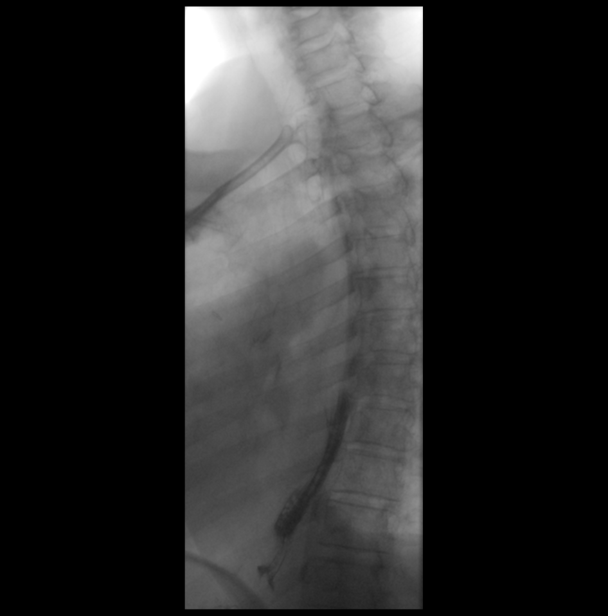
[frame 7/38]
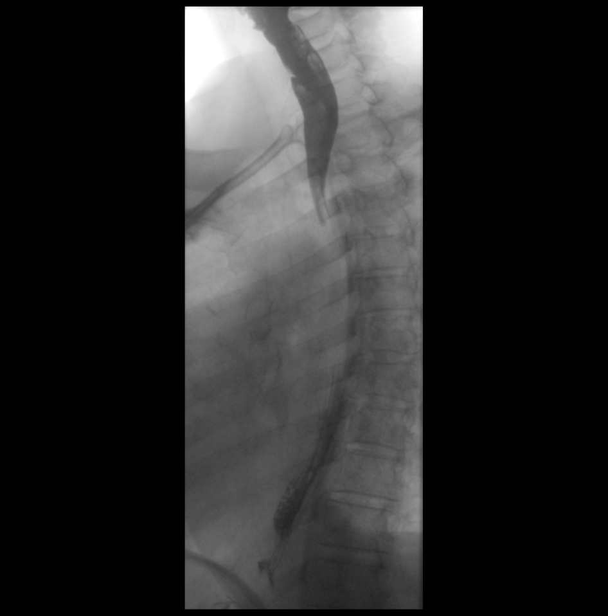
[frame 33/38]
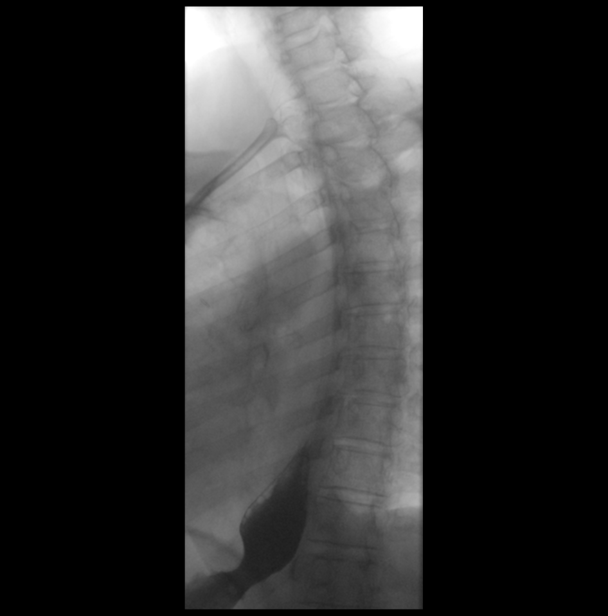

[Series 9: fluoro_barium 2fps_bw · 0.19mm/px · 1 of 1 slices shown (4 of 5)]
[im 1/1]
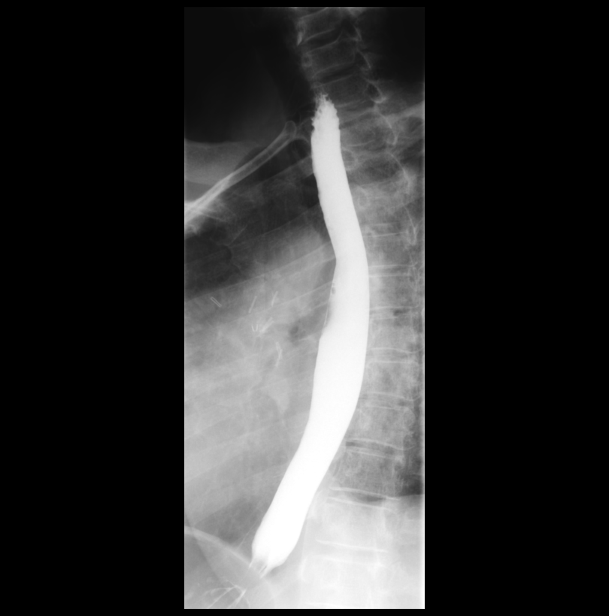

[Series 11: fluoro_barium 2fps_bw · 0.19mm/px · 1 of 1 slices shown (5 of 5)]
[im 1/1]
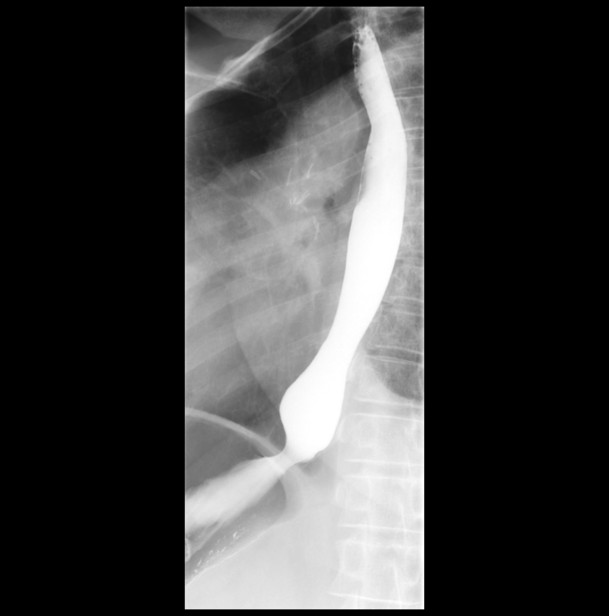

[Series 12: cp_standard · 0.26mm/px · 1 of 1 slices shown (4 of 5)]
[im 1/1]
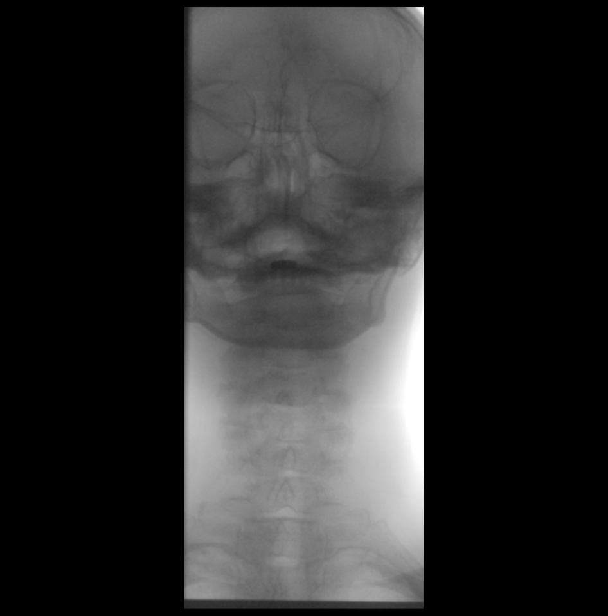

[Series 14: cp_standard · 0.26mm/px · 1 of 1 slices shown (5 of 5)]
[im 1/1]
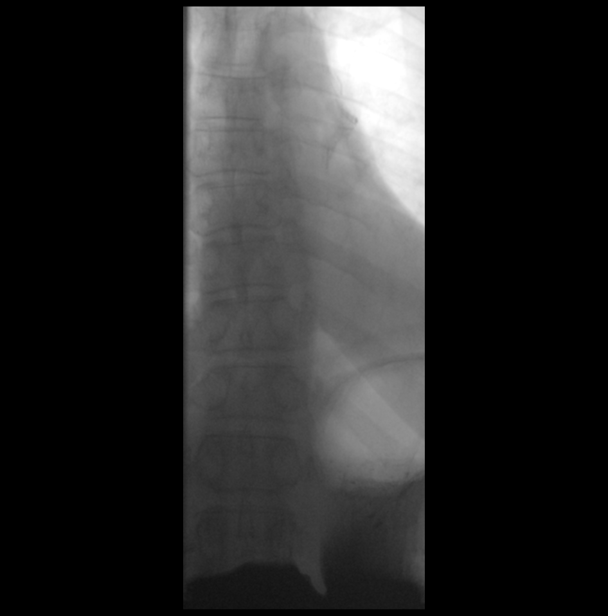

[14 of 21 positions shown; findings below may reference images not displayed]

FINDINGS: Double contrast images demonstrated a normal appearance of the
esophageal mucosa. Multiple single swallow attempts were observed,
which demonstrated normal esophageal motility. Full column
esophagram demonstrated no esophageal mass, stricture or esophageal
ring. No hiatal hernia. Water siphon test demonstrated no
gastroesophageal reflux. A barium tablet was administered, which
passed readily into the stomach.
IMPRESSION: 1. Normal esophagram, as above.

## 2017-09-29 ENCOUNTER — Encounter: Payer: Self-pay | Admitting: Physical Therapy

## 2017-09-29 ENCOUNTER — Ambulatory Visit: Payer: Medicare Other | Attending: Specialist | Admitting: Physical Therapy

## 2017-09-29 DIAGNOSIS — M25552 Pain in left hip: Secondary | ICD-10-CM | POA: Diagnosis not present

## 2017-09-29 DIAGNOSIS — R262 Difficulty in walking, not elsewhere classified: Secondary | ICD-10-CM | POA: Diagnosis not present

## 2017-09-29 DIAGNOSIS — M6281 Muscle weakness (generalized): Secondary | ICD-10-CM | POA: Diagnosis not present

## 2017-09-29 DIAGNOSIS — G8929 Other chronic pain: Secondary | ICD-10-CM | POA: Diagnosis not present

## 2017-09-29 DIAGNOSIS — M5442 Lumbago with sciatica, left side: Secondary | ICD-10-CM | POA: Diagnosis not present

## 2017-09-29 NOTE — Therapy (Signed)
[facility] [place], [place], [address] Phone: [phone]   Fax:  [phone]  Physical Therapy Evaluation  Patient Details  Name: [name] MRN: [id number] Date of Birth: [birth year] Referring Provider: Basil Dess, MD   Encounter Date: 09/29/2017  PT End of Session - 09/29/17 1433    Visit Number  1    Number of Visits  13    Date for PT Re-Evaluation  11/29/17    Authorization Type  KX modifier after visit 15    PT Start Time  1030    PT Stop Time  1105    PT Time Calculation (min)  35 min    Activity Tolerance  Patient tolerated treatment well    Behavior During Therapy  [facility] for tasks assessed/performed       Past Medical History:  Diagnosis Date  . Anxiety   . Asthma    every now and then.  Wheezing and coughing  . Cancer ([place]) 1997   left lung  . Chronic abdominal pain   . Chronic back pain   . Chronic joint pain   . Depression   . GERD (gastroesophageal reflux disease)   . HLD (hyperlipidemia)   . HTN (hypertension)   . Nausea and vomiting    chronic, recurrent  . Osteoarthrosis involving more than one site but not generalized   . Palpitations 01/2013   chronic, intermittent  . Shortness of breath dyspnea    with exertion    Past Surgical History:  Procedure Laterality Date  . ABDOMINAL HYSTERECTOMY    . ABDOMINAL SURGERY     ovarian cyst removal  . BIOPSY N/A 11/22/2013   Procedure: GASTRIC BIOPSY;  Surgeon: Daneil Dolin, MD;  Location: [facility];  Service: Endoscopy;  Laterality: N/A;  . BLADDER SURGERY  suspension x4   x 4  . CARPAL TUNNEL RELEASE Right 07/11/2015   Procedure: RIGHT CARPAL TUNNEL RELEASE;  Surgeon: Jessy Oto, MD;  Location: [place];  Service: Orthopedics;  Laterality: Right;  . CHOLECYSTECTOMY N/A 04/05/2014   Procedure: LAPAROSCOPIC CHOLECYSTECTOMY;  Surgeon: Aviva Signs Md, MD;  Location: [facility];  Service: General;  Laterality: N/A;  . COCCYGECTOMY  N/A 04/11/2015   Procedure: COCCYGECTOMY WITH OSTEOTOMY THROUGH AREA OF DEFORMITY;  Surgeon: Jessy Oto, MD;  Location: [place];  Service: Orthopedics;  Laterality: N/A;  . COLONOSCOPY    . COLONOSCOPY WITH PROPOFOL N/A 11/22/2013   Dr. Gala Romney: Grade 3 and 4/internal hemorrhoids?"likely source of hematochezia Normal colonoscopy otherwise.   Marland Kitchen COLONOSCOPY WITH PROPOFOL N/A 09/15/2017   Procedure: COLONOSCOPY WITH PROPOFOL;  Surgeon: Daneil Dolin, MD;  [facility];  Service: Endoscopy;  Laterality: N/A;  7:30am  . ESOPHAGOGASTRODUODENOSCOPY (EGD) WITH PROPOFOL N/A 11/22/2013   Dr. Gala Romney: Incomplete Schatzki's ring dilated and disrupted as described above.  Small hiatal hernia. Focally abnormal gastric mucosa of uncertain significance status post biopsy, reactive gastropathy, negative H.pylori  . ESOPHAGOGASTRODUODENOSCOPY (EGD) WITH PROPOFOL N/A 08/04/2015   Dr. Gala Romney: mild Schatzki's ring s/p dilation, small hiatal hernia   . ESOPHAGOGASTRODUODENOSCOPY (EGD) WITH PROPOFOL N/A 03/17/2017   Mild Schatki's ring s/p dilation, small hiatal hernia, otherwise normal  . EXCISION VAGINAL CYST Left 06/20/2012   Procedure: EXCISION LEFT LABIAL CYST;  Surgeon: Jonnie Kind, MD;  Location: [facility];  Service: Gynecology;  Laterality: Left;  . LOBECTOMY Left   . LUNG CANCER SURGERY  1997   age 28, resection only  .  MALONEY DILATION N/A 11/22/2013   Procedure: [name] DILATION;  Surgeon: Daneil Dolin, MD;  [facility]: [facility];  Service: Endoscopy;  Laterality: N/A;  56  . MALONEY DILATION N/A 08/04/2015   Procedure: [name] DILATION;  Surgeon: Daneil Dolin, MD;  [facility];  Service: Endoscopy;  Laterality: N/A;  [name] DILATION N/A 03/17/2017   Procedure: [name] DILATION;  Surgeon: Daneil Dolin, MD;  [facility];  Service: Endoscopy;  Laterality: N/A;  . MASS EXCISION N/A 07/23/2014   Procedure: EXCISIONAL BIOPSY NODULE LEFT PARACOCCYGEAL AREA;  Surgeon: Jessy Oto,  MD;  Location: [place];  Service: Orthopedics;  Laterality: N/A;  . MASS EXCISION Left 10/10/2015   Procedure: EXCISIONAL BIOPSY OF LEFT DORSORADIAL FOREARM MASS LIPOMA;  Surgeon: Jessy Oto, MD;  Location: [place];  Service: Orthopedics;  Laterality: Left;  . TUBAL LIGATION      There were no vitals filed for this visit.   Subjective Assessment - 09/29/17 1041    Subjective  Pt arriving to therapy for evaluation of L hip ischial/femoral impingment with quadratus femoris and gluteal minimus strain. Pt reporting the left hip pain began about 3 months ago and radiates down L LE into her toes. Pt reporting difficulty walking, standing, sleeping and doing daily activities. Currently pain reported 9/10 in left hip.     Pertinent History  L ischial/femoral impingment, quadratus femoris strain, glutius minimus strain, h/o lung CA (20 years in remission), anxiety,  back pain, HTN, depression    Limitations  Sitting;Lifting;Standing;Walking;Other (comment);House hold activities    How long can you sit comfortably?  10 minutes    How long can you stand comfortably?  5- 10 minutes    How long can you walk comfortably?  unable to walk comfortably    Patient Stated Goals  walk without pain, be able to sleep at night    Currently in Pain?  Yes    Pain Score  9     Pain Location  Hip    Pain Orientation  Left    Pain Descriptors / Indicators  Aching;Burning;Throbbing    Pain Type  Acute pain    Pain Onset  More than a month ago    Pain Frequency  Intermittent    Aggravating Factors   standing, walking, bending, lifting, sleeping on my side    Pain Relieving Factors  rubbing and chaning positions         [facility] PT Assessment - 09/29/17 0001      Assessment   Medical Diagnosis  Lumbar radiculopathy Lt     Referring Provider  Basil Dess, MD    Hand Dominance  Right    Next MD Visit  follow up in 4-6 weeks      Precautions   Precautions  None      Restrictions   Weight  Bearing Restrictions  No      Balance Screen   Has the patient fallen in the past 6 months  No    Has the patient had a decrease in activity level because of a fear of falling?   Yes    Is the patient reluctant to leave their home because of a fear of falling?   No      Home Film/video editor residence      Prior Function   Level of Independence  Independent    Vocation  On disability    Leisure  crochet, wood working       Charity fundraiser Status  Within Functional Limits for tasks assessed      Observation/Other Assessments   Focus on Therapeutic Outcomes (FOTO)   63% limitation      Single Leg Stance   Comments  L LE 0 seconds      ROM / Strength   AROM / PROM / Strength  AROM;Strength      AROM   AROM Assessment Site  Lumbar    Lumbar Flexion  60    Lumbar Extension  10    Lumbar - Right Side Bend  25    Lumbar - Left Side Bend  15    Lumbar - Right Rotation  50% limitation    Lumbar - Left Rotation  25% limitation       Strength   Strength Assessment Site  Hip    Right/Left Hip  Right;Left    Right Hip Flexion  4/5    Right Hip Extension  4/5    Right Hip ABduction  4/5    Left Hip Flexion  2+/5    Left Hip Extension  2+/5    Left Hip ABduction  2+/5    Right/Left Knee  Right;Left    Left Knee Flexion  3/5    Left Knee Extension  3/5      Palpation   Palpation comment  Tenderness noted L4-5, L5-S1 , left piriformis, and L lateral hip musculature      Special Tests    Special Tests  Sacrolliac Tests    Sacroiliac Tests   Sacral Compression      Sacral Compression   Findings  Negative    Side   Right    Comments  Left      Ambulation/Gait   Ambulation Distance (Feet)  50 Feet    Assistive device  None    Gait Pattern  Step-through pattern;Decreased stance time - left;Decreased dorsiflexion - left;Decreased weight shift to left;Antalgic    Ambulation Surface  Level                Objective  measurements completed on examination: See above findings.      [facility] Adult PT Treatment/Exercise - 09/29/17 0001      Modalities   Modalities  Electrical Stimulation;Moist Heat      Moist Heat Therapy   Number Minutes Moist Heat  15 Minutes    Moist Heat Location  Hip      Electrical Stimulation   Electrical Stimulation Location  lumbar spine on left side and L hip    Electrical Stimulation Action  pre-mod    Electrical Stimulation Parameters  80-150 Hz x 15 minutes, intensity to pt's tolreance    Electrical Stimulation Goals  Pain             PT Education - 09/29/17 1433    Education provided  Yes    Education Details  HEP    Methods  Explanation;Demonstration;Handout;Verbal cues    Comprehension  Verbalized understanding;Returned demonstration       PT Short Term Goals - 09/29/17 1434      PT SHORT TERM GOAL #1   Title  Pt will be independent in her HEP.     Time  3    Period  Weeks      PT SHORT TERM GOAL #2   Title  Pt will be able to amb for 15 minutes in order to go to peform  household tasks with pain </= 5/10.     Time  3    Period  Weeks    Status  New      PT SHORT TERM GOAL #3   Title  -      PT SHORT TERM GOAL #4   Title  -      PT SHORT TERM GOAL #5   Title  -        PT Long Term Goals - 09/29/17 1443      PT LONG TERM GOAL #1   Title  Pt will improve her FOTO from 63% limitation to </= 48% limitation.     Time  6    Period  Weeks    Status  New    Target Date  11/10/17      PT LONG TERM GOAL #2   Title  Pt will be able to amb for 30 minutes with pain </= 4/10 to peform community level distances.     Time  6    Period  Weeks    Status  New      PT LONG TERM GOAL #3   Title  Pt will improve her L LE Strength to grossly >/=4/5 in order to improve gait and functional mobility.     Time  6    Period  Weeks    Status  New    Target Date  11/10/17      PT LONG TERM GOAL #4   Title  -      PT LONG TERM GOAL #5   Title  -              Plan - 09/29/17 1456    Clinical Impression Statement  Pt arriving to therapy reporting 9/10 pain in her left hip. Pt dx with left hip ischial/femoral impingment, quadratus femoral strain and gluteal minimus strain. Pt also reporting low back pain. Pt with history of lumbar fusion in 2016. Pt reporting L hip pain which radiates in her toes. Pt's MRI on 08/24/17 was unremarkable.  Pt presenting with weakness in L LE, difficulty walking and also reporting leg length difference. Pt's pelvic alignment was checked and appears intact. Leg length difference noted with pt reporting she is to follow up with orthotist for a shoe lift. Pt would benefit from skilled PT to adrress her functional impiarments with the below interventions.     History and Personal Factors relevant to plan of care:  OA, HTN, lumbar fusion, h/o lung CA, anxiety, depression, GERD    Clinical Presentation  Evolving    Clinical Presentation due to:  radiation down L LE into toes    Clinical Decision Making  Moderate    Rehab Potential  Good    PT Frequency  2x / week    PT Duration  6 weeks    PT Treatment/Interventions  Therapeutic exercise;Balance training;Functional mobility training;Therapeutic activities;Patient/family education;Dry needling    PT Next Visit Plan  Hip stretches, strengthening, lumbar stretching/core strengthening, E-stim and heat    PT Home Exercise Plan  see pt instructions    Consulted and Agree with Plan of Care  Patient       Patient will benefit from skilled therapeutic intervention in order to improve the following deficits and impairments:  Abnormal gait, Decreased activity tolerance, Decreased balance, Decreased strength, Difficulty walking, Postural dysfunction, Pain  Visit Diagnosis: Pain in left hip  Muscle weakness (generalized)  Chronic bilateral low back pain with left-sided sciatica  Difficulty in walking, not elsewhere classified     Problem List Patient Active Problem  List   Diagnosis Date Noted  . Dysphagia 01/20/2017  . Left hip pain 01/19/2017  . Right hand pain 01/19/2017  . Diarrhea 10/11/2016  . Right hip pain 06/07/2016  . Dysuria 04/14/2016  . Malaise and fatigue 04/14/2016  . Breast pain, left 11/06/2015  . Mass of left forearm 10/10/2015    Class: Chronic  . Dysphagia, oropharyngeal   . Gastroesophageal reflux disease without esophagitis   . Carpal tunnel syndrome, right 07/11/2015    Class: Chronic  . Prediabetes 07/09/2015  . Lumbar stenosis with neurogenic claudication 01/05/2015  . Spondylolisthesis of lumbar region 01/03/2015    Class: Chronic  . Spinal stenosis, lumbar region, with neurogenic claudication 01/03/2015    Class: Chronic  . Soft tissue mass 07/23/2014    Class: Acute  . Coccygodynia 07/23/2014    Class: Acute  . Healthcare maintenance 03/19/2014  . Constipation 02/15/2014  . Dysphagia, pharyngoesophageal phase   . Elevated alkaline phosphatase level 11/06/2013  . Rectal bleeding 11/06/2013  . Abdominal pain 10/03/2013  . Obesity, unspecified 02/15/2013  . Genital herpes 10/12/2012  . HSV (herpes simplex virus) anogenital infection 09/29/2012  . UNSPECIFIED SLEEP APNEA 12/04/2008  . ADENOCARCINOMA, LUNG 10/09/2008  . Osteoarthritis of multiple joints 07/13/2006  . HYPERCHOLESTEROLEMIA 03/17/2006  . DEPRESSION, MAJOR, RECURRENT 03/17/2006  . HYPERTENSION, BENIGN SYSTEMIC 03/17/2006  . GASTROESOPHAGEAL REFLUX, NO ESOPHAGITIS 03/17/2006  . INSOMNIA NOS 03/17/2006    Oretha Caprice, MPT 09/29/2017, 3:06 PM  [facility] [place], [place], [address] Phone: [phone]   Fax:  [phone]  Name: [name] MRN: [id number] Date of Birth: 10-23-1960

## 2017-10-03 ENCOUNTER — Ambulatory Visit (HOSPITAL_COMMUNITY)
Admission: RE | Admit: 2017-10-03 | Discharge: 2017-10-03 | Disposition: A | Discharge status: Home / Self Care | Payer: Medicare Other | Source: Ambulatory Visit | Attending: Family Medicine | Admitting: Family Medicine

## 2017-10-03 ENCOUNTER — Other Ambulatory Visit: Payer: Self-pay

## 2017-10-03 ENCOUNTER — Ambulatory Visit (INDEPENDENT_AMBULATORY_CARE_PROVIDER_SITE_OTHER): Payer: Medicare Other | Admitting: Family Medicine

## 2017-10-03 ENCOUNTER — Encounter: Payer: Self-pay | Admitting: Family Medicine

## 2017-10-03 VITALS — BP 152/68 | HR 86 | Temp 98.2°F | Wt 182.0 lb

## 2017-10-03 DIAGNOSIS — E876 Hypokalemia: Secondary | ICD-10-CM | POA: Diagnosis not present

## 2017-10-03 DIAGNOSIS — R42 Dizziness and giddiness: Secondary | ICD-10-CM | POA: Insufficient documentation

## 2017-10-03 DIAGNOSIS — R55 Syncope and collapse: Secondary | ICD-10-CM | POA: Diagnosis not present

## 2017-10-03 DIAGNOSIS — I10 Essential (primary) hypertension: Secondary | ICD-10-CM

## 2017-10-03 MED ORDER — AMLODIPINE BESYLATE 5 MG PO TABS: 5.0000 mg | ORAL_TABLET | Freq: Every day | ORAL | 3 refills | Status: DC

## 2017-10-03 NOTE — Progress Notes (Signed)
Patient Name: [name] Date of Birth: 06-22-1960 Date of Visit: 10/03/17 PCP: Guadalupe Dawn, MD  Chief Complaint: Dizziness and syncope at home  Subjective: MARGRETTA ZAMORANO is a pleasant 57 y.o. year old with history significant for hypertension, dyslipidemia, and osteoarthritis presenting today with complaints of dizziness upon standing and syncope over the past month.  She is joined by her sister today.   Ms. Noori reports a one-month history of dizziness upon standing.  In addition to the symptoms she reports 4 episodes of loss of consciousness.  She reports the first episode happened at home after dinner while she was sitting on her couch.  She felt a prodrome of slight dizziness and then passed out she woke up immediately when her husband checked her.  She did not hit her head.  Afterward she says she felt a little tired.  She denies any shaking, tongue biting, urinary incontinence, chest pain, palpitations.  She did have some diaphoresis after this.  She reports that upon standing after the episode she felt dizzy.  She describes a sensation of lightheadedness whereby she feels like she is going to fall.  She has not fallen.  She denies any nausea or vomiting prior to the episode.  She has had 3 more episodes since that time.  2 of these occurred while she was going from sitting to standing.  One occurred while she was sitting.  She has had no occurrences of hitting her head.  She denies headaches, change in vision, change in speech, numbness, weakness.  She has no history of seizures.  She denies dyspnea or chest pain on exertion.  She denies change in medication in the last month.  She was started on Lyrica 1 week ago.  She does spilled water on herself with these episodes.  She had a glass of water in her hand with two episodes.  She has not had any urinary incontinence with these episodes.   Family history is significant for coronary artery disease in her father and a sister with  non-epileptiform spells.  Medical history significant for hypertension and dyslipidemia.  She is on hydrochlorothiazide.  Social history notable for significant financial and family stressors recently.  ROS:  ROS Negative for headaches, blurry vision, change in speech, chest pain, palpitations, tongue biting, history of seizures, family history of seizure disorder.    I have reviewed the patient's medical, surgical, family, and social history as appropriate.   Vitals:   10/03/17 0823  BP: (!) 152/68  Pulse: 86  Temp: 98.2 F (36.8 C)  SpO2: 96%   Filed Weights   10/03/17 0823  Weight: 182 lb (82.6 kg)  Orthostatic vital signs positive. HEENT: Sclera anicteric. Dentition is moderate. Appears well hydrated. Neck: Supple Cardiac: Regular rate and rhythm. Normal S1/S2. No murmurs, rubs, or gallops appreciated. Lungs: Clear bilaterally to ascultation.  Extremities: Warm, well perfused without edema.  Skin: Warm, dry Psych: Pleasant and appropriate  Neuro: Cranial nerves II through XII tested and fully intact preserved upper extremity and lower extremity strength.  Alert and oriented x3 speech is normal.  Data review: Reviewed prior labs which is notable for hypokalemia to 3.0 previously 2.7.  EKG today obtained sinus rhythm with changes do respirations.  No ST or T wave changes from prior in comparison.  Tona was seen today for loss of consciousness.  Diagnoses and all orders for this visit:  Syncope, likely orthostatic patient with a history of hypertension and dyslipidemia.  Four syncopal episodes in  the past month.  She has associated prodromal symptoms and dizziness with this.  2 of these occurred while going from sitting to standing.  Orthostatic vital signs today are positive suggestive of possibly medication induced.  What does not fit is the fact that when she had two of these she was sitting.  Differential also includes hyperkalemia.  Discussed that would recommend  emergency department evaluation if this occurs again we discussed the risks of syncope.  Recommended that she not drive she voiced understanding.  Other causes include cardiogenic cause, vasovagal symptom as this was postprandial less likely seizure. -     ECHOCARDIOGRAM COMPLETE; Future -     Magnesium -     Basic Metabolic Panel -     CBC -     EKG 12-Lead  Essential hypertension despite being orthostatic today she is still hypertensive by most recent continue.  Discussed at length.  Suspect HCTZ may be contributing to her orthostasis and resulting syncope will stop this medication repeat labs and start medication -     amLODipine (NORVASC) 5 MG tablet; Take 1 tablet (5 mg total) by mouth daily.  Hypokalemia repeat metabolic panel.  Suspect to the HCTZ.  Will stop this as this could also be contributing to orthostasis.  Will start Norvasc.  -     Magnesium  Dorris Singh, MD  [facility] Medicine Teaching Service

## 2017-10-03 NOTE — Patient Instructions (Addendum)
If you have another episode while sitting, please go to the Emergency Department  Please be sure to obtain your echocardiogram  DO NOT DRIVE  I will call you with lab results   STOP HCTZ   Start low dose amlodipine---a difference blood pressure medication    It was wonderful to see you today.  Thank you for choosing [facility].   Please call [phone] with any questions about today's appointment.  Please be sure to schedule follow up at the front  desk before you leave today.   Dorris Singh, MD  Family Medicine

## 2017-10-04 ENCOUNTER — Encounter: Payer: Medicare Other | Admitting: Physical Therapy

## 2017-10-04 LAB — CBC
HEMATOCRIT: 38.4 % (ref 34.0–46.6)
Hemoglobin: 12.8 g/dL (ref 11.1–15.9)
MCH: 30.6 pg (ref 26.6–33.0)
MCHC: 33.3 g/dL (ref 31.5–35.7)
MCV: 92 fL (ref 79–97)
Platelets: 275 10*3/uL (ref 150–450)
RBC: 4.18 x10E6/uL (ref 3.77–5.28)
RDW: 12.2 % — AB (ref 12.3–15.4)
WBC: 5.4 10*3/uL (ref 3.4–10.8)

## 2017-10-04 LAB — BASIC METABOLIC PANEL
BUN / CREAT RATIO: 6 — AB (ref 9–23)
BUN: 6 mg/dL (ref 6–24)
CALCIUM: 9.3 mg/dL (ref 8.7–10.2)
CO2: 29 mmol/L (ref 20–29)
CREATININE: 0.96 mg/dL (ref 0.57–1.00)
Chloride: 97 mmol/L (ref 96–106)
GFR calc Af Amer: 78 mL/min/{1.73_m2} (ref >59)
GFR calc non Af Amer: 67 mL/min/{1.73_m2} (ref >59)
GLUCOSE: 96 mg/dL (ref 65–99)
Potassium: 3.6 mmol/L (ref 3.5–5.2)
Sodium: 141 mmol/L (ref 134–144)

## 2017-10-04 LAB — MAGNESIUM: Magnesium: 1.9 mg/dL (ref 1.6–2.3)

## 2017-10-06 ENCOUNTER — Encounter: Payer: Medicare Other | Admitting: Physical Therapy

## 2017-10-11 ENCOUNTER — Ambulatory Visit: Payer: Medicare Other | Admitting: Physical Therapy

## 2017-10-11 ENCOUNTER — Encounter: Payer: Self-pay | Admitting: Physical Therapy

## 2017-10-11 DIAGNOSIS — M6281 Muscle weakness (generalized): Secondary | ICD-10-CM | POA: Diagnosis not present

## 2017-10-11 DIAGNOSIS — R262 Difficulty in walking, not elsewhere classified: Secondary | ICD-10-CM | POA: Diagnosis not present

## 2017-10-11 DIAGNOSIS — G8929 Other chronic pain: Secondary | ICD-10-CM

## 2017-10-11 DIAGNOSIS — M5442 Lumbago with sciatica, left side: Secondary | ICD-10-CM | POA: Diagnosis not present

## 2017-10-11 DIAGNOSIS — M25552 Pain in left hip: Secondary | ICD-10-CM

## 2017-10-11 NOTE — Therapy (Signed)
[facility] [place], [place], [address] Phone: [phone]   Fax:  [phone]  Physical Therapy Treatment  Patient Details  Name: [name] MRN: [id number] Date of Birth: [birth year] Referring Provider: Basil Dess, MD   Encounter Date: 10/11/2017  PT End of Session - 10/11/17 0734    Visit Number  2    Number of Visits  13    Date for PT Re-Evaluation  11/29/17    Authorization Type  KX modifier after visit 15    PT Start Time  0731    PT Stop Time  0821    PT Time Calculation (min)  50 min    Activity Tolerance  Patient tolerated treatment well    Behavior During Therapy  [facility] for tasks assessed/performed       Past Medical History:  Diagnosis Date  . Anxiety   . Asthma    every now and then.  Wheezing and coughing  . Cancer ([place]) 1997   left lung  . Chronic abdominal pain   . Chronic back pain   . Chronic joint pain   . Depression   . GERD (gastroesophageal reflux disease)   . HLD (hyperlipidemia)   . HTN (hypertension)   . Nausea and vomiting    chronic, recurrent  . Osteoarthrosis involving more than one site but not generalized   . Palpitations 01/2013   chronic, intermittent  . Shortness of breath dyspnea    with exertion    Past Surgical History:  Procedure Laterality Date  . ABDOMINAL HYSTERECTOMY    . ABDOMINAL SURGERY     ovarian cyst removal  . BIOPSY N/A 11/22/2013   Procedure: GASTRIC BIOPSY;  Surgeon: Daneil Dolin, MD;  [facility];  Service: Endoscopy;  Laterality: N/A;  . BLADDER SURGERY  suspension x4   x 4  . CARPAL TUNNEL RELEASE Right 07/11/2015   Procedure: RIGHT CARPAL TUNNEL RELEASE;  Surgeon: Jessy Oto, MD;  Location: [place];  Service: Orthopedics;  Laterality: Right;  . CHOLECYSTECTOMY N/A 04/05/2014   Procedure: LAPAROSCOPIC CHOLECYSTECTOMY;  Surgeon: Aviva Signs Md, MD;  [facility]: [facility];  Service: General;  Laterality: N/A;  . COCCYGECTOMY  N/A 04/11/2015   Procedure: COCCYGECTOMY WITH OSTEOTOMY THROUGH AREA OF DEFORMITY;  Surgeon: Jessy Oto, MD;  Location: [place];  Service: Orthopedics;  Laterality: N/A;  . COLONOSCOPY    . COLONOSCOPY WITH PROPOFOL N/A 11/22/2013   Dr. Gala Romney: Grade 3 and 4/internal hemorrhoids?"likely source of hematochezia Normal colonoscopy otherwise.   Marland Kitchen COLONOSCOPY WITH PROPOFOL N/A 09/15/2017   Procedure: COLONOSCOPY WITH PROPOFOL;  Surgeon: Daneil Dolin, MD;  [facility];  Service: Endoscopy;  Laterality: N/A;  7:30am  . ESOPHAGOGASTRODUODENOSCOPY (EGD) WITH PROPOFOL N/A 11/22/2013   Dr. Gala Romney: Incomplete Schatzki's ring dilated and disrupted as described above.  Small hiatal hernia. Focally abnormal gastric mucosa of uncertain significance status post biopsy, reactive gastropathy, negative H.pylori  . ESOPHAGOGASTRODUODENOSCOPY (EGD) WITH PROPOFOL N/A 08/04/2015   Dr. Gala Romney: mild Schatzki's ring s/p dilation, small hiatal hernia   . ESOPHAGOGASTRODUODENOSCOPY (EGD) WITH PROPOFOL N/A 03/17/2017   Mild Schatki's ring s/p dilation, small hiatal hernia, otherwise normal  . EXCISION VAGINAL CYST Left 06/20/2012   Procedure: EXCISION LEFT LABIAL CYST;  Surgeon: Jonnie Kind, MD;  [facility]: [facility];  Service: Gynecology;  Laterality: Left;  . LOBECTOMY Left   . LUNG CANCER SURGERY  1997   age 35, resection only  .  MALONEY DILATION N/A 11/22/2013   Procedure: [name] DILATION;  Surgeon: Daneil Dolin, MD;  [facility]: [facility];  Service: Endoscopy;  Laterality: N/A;  56  . MALONEY DILATION N/A 08/04/2015   Procedure: [name] DILATION;  Surgeon: Daneil Dolin, MD;  [facility];  Service: Endoscopy;  Laterality: N/A;  [name] DILATION N/A 03/17/2017   Procedure: [name] DILATION;  Surgeon: Daneil Dolin, MD;  [facility];  Service: Endoscopy;  Laterality: N/A;  . MASS EXCISION N/A 07/23/2014   Procedure: EXCISIONAL BIOPSY NODULE LEFT PARACOCCYGEAL AREA;  Surgeon: Jessy Oto,  MD;  Location: [place];  Service: Orthopedics;  Laterality: N/A;  . MASS EXCISION Left 10/10/2015   Procedure: EXCISIONAL BIOPSY OF LEFT DORSORADIAL FOREARM MASS LIPOMA;  Surgeon: Jessy Oto, MD;  Location: [place];  Service: Orthopedics;  Laterality: Left;  . TUBAL LIGATION      There were no vitals filed for this visit.  Subjective Assessment - 10/11/17 0733    Subjective  Patient arrived in clinic with reports of falling in her bedroom last night as her leg gave away for unknown reason. Reports that she has a lumbar fusion but if the pain gets any worse she will go to ED.    Pertinent History  L ischial/femoral impingment, quadratus femoris strain, glutius minimus strain, h/o lung CA (20 years in remission), anxiety,  back pain, HTN, depression    Limitations  Sitting;Lifting;Standing;Walking;Other (comment);House hold activities    How long can you sit comfortably?  10 minutes    How long can you stand comfortably?  5- 10 minutes    How long can you walk comfortably?  unable to walk comfortably    Patient Stated Goals  walk without pain, be able to sleep at night    Currently in Pain?  Yes    Pain Score  7     Pain Location  Hip    Pain Orientation  Left    Pain Descriptors / Indicators  Aching;Burning;Throbbing    Pain Type  Acute pain    Pain Onset  More than a month ago    Pain Frequency  Intermittent         [facility] PT Assessment - 10/11/17 0001      Assessment   Medical Diagnosis  Lumbar radiculopathy Lt     Hand Dominance  Right    Next MD Visit  follow up in 4-6 weeks    Prior Therapy  none      Precautions   Precautions  None      Restrictions   Weight Bearing Restrictions  No                   [facility] Adult PT Treatment/Exercise - 10/11/17 0001      Exercises   Exercises  Lumbar      Lumbar Exercises: Stretches   Passive Hamstring Stretch  Left;3 reps;30 seconds    Hip Flexor Stretch  Left;3 reps;30 seconds    Quad Stretch   Left;3 reps;30 seconds    ITB Stretch  Left;3 reps;30 seconds      Lumbar Exercises: Aerobic   Nustep  L4 x10 min      Modalities   Modalities  Electrical Stimulation;Moist Heat      Moist Heat Therapy   Number Minutes Moist Heat  15 Minutes    Moist Heat Location  Hip      Electrical Stimulation   Electrical Stimulation  Location  L hip    Electrical Stimulation Action  Pre-Mod    Electrical Stimulation Parameters  80-150 hz x15 min    Electrical Stimulation Goals  Pain;Tone      Manual Therapy   Manual Therapy  Soft tissue mobilization    Soft tissue mobilization  Gentle STW to L proximal quad, ITB to reduce tone and pai               PT Short Term Goals - 09/29/17 1434      PT SHORT TERM GOAL #1   Title  Pt will be independent in her HEP.     Time  3    Period  Weeks      PT SHORT TERM GOAL #2   Title  Pt will be able to amb for 15 minutes in order to go to peform household tasks with pain </= 5/10.     Time  3    Period  Weeks    Status  New      PT SHORT TERM GOAL #3   Title  -      PT SHORT TERM GOAL #4   Title  -      PT SHORT TERM GOAL #5   Title  -        PT Long Term Goals - 09/29/17 1443      PT LONG TERM GOAL #1   Title  Pt will improve her FOTO from 63% limitation to </= 48% limitation.     Time  6    Period  Weeks    Status  New    Target Date  11/10/17      PT LONG TERM GOAL #2   Title  Pt will be able to amb for 30 minutes with pain </= 4/10 to peform community level distances.     Time  6    Period  Weeks    Status  New      PT LONG TERM GOAL #3   Title  Pt will improve her L LE Strength to grossly >/=4/5 in order to improve gait and functional mobility.     Time  6    Period  Weeks    Status  New    Target Date  11/10/17      PT LONG TERM GOAL #4   Title  -      PT LONG TERM GOAL #5   Title  -            Plan - 10/11/17 0809    Clinical Impression Statement  Patient presented in clinic with reports of fall  last night in her home which she reports LBP and L hip discomfort. Light stretching and Nustep completed today with reports of discomfort during nustep. Gentle stretching completed with patient experiencing stretch with each rep. Increased tone palpable in L proximal quad as well as ITB region. Gentle STW completed to L proximal quad and ITB with no reports of any tenderness. Normal modalities response noted following removal of the modalities. Patient to get a heel lift for her shoe per MD prescription next week due to LLE leg length discrepancy. Patient reported L hip feeling "okay" following end of treatment.    Rehab Potential  Good    PT Frequency  2x / week    PT Duration  6 weeks    PT Treatment/Interventions  Therapeutic exercise;Balance training;Functional mobility training;Therapeutic activities;Patient/family education;Dry needling    PT Next Visit  Plan  Hip stretches, strengthening, lumbar stretching/core strengthening, E-stim and heat    PT Home Exercise Plan  see pt instructions    Consulted and Agree with Plan of Care  Patient       Patient will benefit from skilled therapeutic intervention in order to improve the following deficits and impairments:  Abnormal gait, Decreased activity tolerance, Decreased balance, Decreased strength, Difficulty walking, Postural dysfunction, Pain  Visit Diagnosis: Pain in left hip  Muscle weakness (generalized)  Chronic bilateral low back pain with left-sided sciatica  Difficulty in walking, not elsewhere classified     Problem List Patient Active Problem List   Diagnosis Date Noted  . Dysphagia 01/20/2017  . Left hip pain 01/19/2017  . Right hand pain 01/19/2017  . Diarrhea 10/11/2016  . Right hip pain 06/07/2016  . Dysuria 04/14/2016  . Malaise and fatigue 04/14/2016  . Breast pain, left 11/06/2015  . Mass of left forearm 10/10/2015    Class: Chronic  . Dysphagia, oropharyngeal   . Gastroesophageal reflux disease without  esophagitis   . Carpal tunnel syndrome, right 07/11/2015    Class: Chronic  . Prediabetes 07/09/2015  . Lumbar stenosis with neurogenic claudication 01/05/2015  . Spondylolisthesis of lumbar region 01/03/2015    Class: Chronic  . Spinal stenosis, lumbar region, with neurogenic claudication 01/03/2015    Class: Chronic  . Soft tissue mass 07/23/2014    Class: Acute  . Coccygodynia 07/23/2014    Class: Acute  . Healthcare maintenance 03/19/2014  . Constipation 02/15/2014  . Dysphagia, pharyngoesophageal phase   . Elevated alkaline phosphatase level 11/06/2013  . Rectal bleeding 11/06/2013  . Abdominal pain 10/03/2013  . Obesity, unspecified 02/15/2013  . Genital herpes 10/12/2012  . HSV (herpes simplex virus) anogenital infection 09/29/2012  . UNSPECIFIED SLEEP APNEA 12/04/2008  . ADENOCARCINOMA, LUNG 10/09/2008  . Osteoarthritis of multiple joints 07/13/2006  . HYPERCHOLESTEROLEMIA 03/17/2006  . DEPRESSION, MAJOR, RECURRENT 03/17/2006  . HYPERTENSION, BENIGN SYSTEMIC 03/17/2006  . GASTROESOPHAGEAL REFLUX, NO ESOPHAGITIS 03/17/2006  . INSOMNIA NOS 03/17/2006    Standley Brooking, PTA 10/11/2017, 8:27 AM  [facility] [address] [place], [place], [address] Phone: [phone]   Fax:  [phone]  Name: [name] MRN: [id number] Date of Birth: Jun 12, 1960

## 2017-10-12 ENCOUNTER — Telehealth: Payer: Self-pay

## 2017-10-12 NOTE — Telephone Encounter (Signed)
Patient left message that she is still having daily headaches and asks to increase her Amlodipine 5 mg to 10 mg.  Attempted to call back for more info but no answer.  Call back is [phone]  Danley Danker, RN [facility] RN)

## 2017-10-12 NOTE — Telephone Encounter (Signed)
Called patient. Discussed symptoms. Reports she has had intermittent headaches for years. These have worsened over the last week. No red flag symptoms. HA are in afternoon, mild, bitemporal. No associated neurological changes. Advised to check BP at [facility] and she will call back with numbers later this week. Please forward to me when she calls back.

## 2017-10-13 ENCOUNTER — Ambulatory Visit: Payer: Medicare Other | Admitting: Physical Therapy

## 2017-10-13 ENCOUNTER — Telehealth: Payer: Self-pay

## 2017-10-13 ENCOUNTER — Encounter: Payer: Self-pay | Admitting: Physical Therapy

## 2017-10-13 VITALS — BP 152/78 | HR 81

## 2017-10-13 DIAGNOSIS — M5442 Lumbago with sciatica, left side: Secondary | ICD-10-CM

## 2017-10-13 DIAGNOSIS — M6281 Muscle weakness (generalized): Secondary | ICD-10-CM

## 2017-10-13 DIAGNOSIS — G8929 Other chronic pain: Secondary | ICD-10-CM | POA: Diagnosis not present

## 2017-10-13 DIAGNOSIS — I10 Essential (primary) hypertension: Secondary | ICD-10-CM

## 2017-10-13 DIAGNOSIS — M25552 Pain in left hip: Secondary | ICD-10-CM | POA: Diagnosis not present

## 2017-10-13 DIAGNOSIS — R262 Difficulty in walking, not elsewhere classified: Secondary | ICD-10-CM

## 2017-10-13 MED ORDER — AMLODIPINE BESYLATE 10 MG PO TABS: 10.0000 mg | ORAL_TABLET | Freq: Every day | ORAL | 3 refills | Status: DC

## 2017-10-13 NOTE — Telephone Encounter (Signed)
Patient called to let Dr Owens Shark know her BP was 152/70 this morning.  Call back is [phone]  Danley Danker, RN [facility] RN)

## 2017-10-13 NOTE — Telephone Encounter (Signed)
Called patient she reports her blood pressures have been in the 160s in the morning.  Increase Norvasc to 10 mg repeat at follow-up.  Reviewed reasons to return to care and call at length.  Reviewed side effects including lower extremity edema.

## 2017-10-13 NOTE — Therapy (Signed)
[facility] [place], [place], [address] Phone: [phone]   Fax:  [phone]  Physical Therapy Treatment  Patient Details  Name: [name] MRN: [id number] Date of Birth: [birth year] Referring Provider: Basil Dess, MD   Encounter Date: 10/13/2017  PT End of Session - 10/13/17 0735    Visit Number  3    Number of Visits  13    Date for PT Re-Evaluation  11/29/17    Authorization Type  KX modifier after visit 15    PT Start Time  0732    PT Stop Time  0810   2 units secondary to early dismissal   PT Time Calculation (min)  38 min    Activity Tolerance  Patient tolerated treatment well    Behavior During Therapy  [facility] for tasks assessed/performed       Past Medical History:  Diagnosis Date  . Anxiety   . Asthma    every now and then.  Wheezing and coughing  . Cancer ([place]) 1997   left lung  . Chronic abdominal pain   . Chronic back pain   . Chronic joint pain   . Depression   . GERD (gastroesophageal reflux disease)   . HLD (hyperlipidemia)   . HTN (hypertension)   . Nausea and vomiting    chronic, recurrent  . Osteoarthrosis involving more than one site but not generalized   . Palpitations 01/2013   chronic, intermittent  . Shortness of breath dyspnea    with exertion    Past Surgical History:  Procedure Laterality Date  . ABDOMINAL HYSTERECTOMY    . ABDOMINAL SURGERY     ovarian cyst removal  . BIOPSY N/A 11/22/2013   Procedure: GASTRIC BIOPSY;  Surgeon: Daneil Dolin, MD;  [facility];  Service: Endoscopy;  Laterality: N/A;  . BLADDER SURGERY  suspension x4   x 4  . CARPAL TUNNEL RELEASE Right 07/11/2015   Procedure: RIGHT CARPAL TUNNEL RELEASE;  Surgeon: Jessy Oto, MD;  Location: [place];  Service: Orthopedics;  Laterality: Right;  . CHOLECYSTECTOMY N/A 04/05/2014   Procedure: LAPAROSCOPIC CHOLECYSTECTOMY;  Surgeon: Aviva Signs Md, MD;  [facility];  Service:  General;  Laterality: N/A;  . COCCYGECTOMY N/A 04/11/2015   Procedure: COCCYGECTOMY WITH OSTEOTOMY THROUGH AREA OF DEFORMITY;  Surgeon: Jessy Oto, MD;  Location: [place];  Service: Orthopedics;  Laterality: N/A;  . COLONOSCOPY    . COLONOSCOPY WITH PROPOFOL N/A 11/22/2013   Dr. Gala Romney: Grade 3 and 4/internal hemorrhoids?"likely source of hematochezia Normal colonoscopy otherwise.   Marland Kitchen COLONOSCOPY WITH PROPOFOL N/A 09/15/2017   Procedure: COLONOSCOPY WITH PROPOFOL;  Surgeon: Daneil Dolin, MD;  [facility];  Service: Endoscopy;  Laterality: N/A;  7:30am  . ESOPHAGOGASTRODUODENOSCOPY (EGD) WITH PROPOFOL N/A 11/22/2013   Dr. Gala Romney: Incomplete Schatzki's ring dilated and disrupted as described above.  Small hiatal hernia. Focally abnormal gastric mucosa of uncertain significance status post biopsy, reactive gastropathy, negative H.pylori  . ESOPHAGOGASTRODUODENOSCOPY (EGD) WITH PROPOFOL N/A 08/04/2015   Dr. Gala Romney: mild Schatzki's ring s/p dilation, small hiatal hernia   . ESOPHAGOGASTRODUODENOSCOPY (EGD) WITH PROPOFOL N/A 03/17/2017   Mild Schatki's ring s/p dilation, small hiatal hernia, otherwise normal  . EXCISION VAGINAL CYST Left 06/20/2012   Procedure: EXCISION LEFT LABIAL CYST;  Surgeon: Jonnie Kind, MD;  [facility];  Service: Gynecology;  Laterality: Left;  . LOBECTOMY Left   . [place]  age 57, resection only  . MALONEY DILATION N/A 11/22/2013   Procedure: [name] DILATION;  Surgeon: Daneil Dolin, MD;  [facility];  Service: Endoscopy;  Laterality: N/A;  56  . MALONEY DILATION N/A 08/04/2015   Procedure: [name] DILATION;  Surgeon: Daneil Dolin, MD;  [facility];  Service: Endoscopy;  Laterality: N/A;  [name] DILATION N/A 03/17/2017   Procedure: [name] DILATION;  Surgeon: Daneil Dolin, MD;  [facility];  Service: Endoscopy;  Laterality: N/A;  . MASS EXCISION N/A 07/23/2014   Procedure: EXCISIONAL BIOPSY NODULE LEFT  PARACOCCYGEAL AREA;  Surgeon: Jessy Oto, MD;  Location: [place];  Service: Orthopedics;  Laterality: N/A;  . MASS EXCISION Left 10/10/2015   Procedure: EXCISIONAL BIOPSY OF LEFT DORSORADIAL FOREARM MASS LIPOMA;  Surgeon: Jessy Oto, MD;  Location: [place];  Service: Orthopedics;  Laterality: Left;  . TUBAL LIGATION      Vitals:   10/13/17 0735  BP: (!) 152/78  Pulse: 81    Subjective Assessment - 10/13/17 0733    Subjective  Patient arrived to clinic with her L hip feeling about the same. Requests to leave prior to 8:15 for another appointment and to have her blood pressure taken.    Pertinent History  L ischial/femoral impingment, quadratus femoris strain, glutius minimus strain, h/o lung CA (20 years in remission), anxiety,  back pain, HTN, depression    Limitations  Sitting;Lifting;Standing;Walking;Other (comment);House hold activities    How long can you sit comfortably?  10 minutes    How long can you stand comfortably?  5- 10 minutes    How long can you walk comfortably?  unable to walk comfortably    Patient Stated Goals  walk without pain, be able to sleep at night    Currently in Pain?  Yes    Pain Score  7     Pain Location  Hip    Pain Orientation  Left    Pain Descriptors / Indicators  Aching;Burning    Pain Type  Acute pain    Pain Onset  More than a month ago         [facility] PT Assessment - 10/13/17 0001      Assessment   Medical Diagnosis  Lumbar radiculopathy Lt     Hand Dominance  Right    Next MD Visit  follow up in 4-6 weeks    Prior Therapy  none      Precautions   Precautions  None      Restrictions   Weight Bearing Restrictions  No                   [facility] Adult PT Treatment/Exercise - 10/13/17 0001      Lumbar Exercises: Stretches   Passive Hamstring Stretch  Left;3 reps;30 seconds    Hip Flexor Stretch  Left;3 reps;30 seconds    Quad Stretch  Left;3 reps;30 seconds    ITB Stretch  Left;3 reps;30 seconds       Lumbar Exercises: Aerobic   Nustep  L3 x12 min      Modalities   Modalities  Electrical Stimulation;Moist Heat      Moist Heat Therapy   Number Minutes Moist Heat  10 Minutes    Moist Heat Location  Hip      Electrical Stimulation   Electrical Stimulation Location  L hip    Electrical Stimulation Action  Pre-Mod    Electrical Stimulation Parameters  80-150 hz x 10 min    Electrical Stimulation Goals  Pain;Tone               PT Short Term Goals - 09/29/17 1434      PT SHORT TERM GOAL #1   Title  Pt will be independent in her HEP.     Time  3    Period  Weeks      PT SHORT TERM GOAL #2   Title  Pt will be able to amb for 15 minutes in order to go to peform household tasks with pain </= 5/10.     Time  3    Period  Weeks    Status  New      PT SHORT TERM GOAL #3   Title  -      PT SHORT TERM GOAL #4   Title  -      PT SHORT TERM GOAL #5   Title  -        PT Long Term Goals - 09/29/17 1443      PT LONG TERM GOAL #1   Title  Pt will improve her FOTO from 63% limitation to </= 48% limitation.     Time  6    Period  Weeks    Status  New    Target Date  11/10/17      PT LONG TERM GOAL #2   Title  Pt will be able to amb for 30 minutes with pain </= 4/10 to peform community level distances.     Time  6    Period  Weeks    Status  New      PT LONG TERM GOAL #3   Title  Pt will improve her L LE Strength to grossly >/=4/5 in order to improve gait and functional mobility.     Time  6    Period  Weeks    Status  New    Target Date  11/10/17      PT LONG TERM GOAL #4   Title  -      PT LONG TERM GOAL #5   Title  -            Plan - 10/13/17 0807    Clinical Impression Statement  Patient presented in clinic with 152/78 BP and 81 bpm per patient request for vitals assessment. Patient reported similar L hip pain as in previous treatment with aching and burning sensation. Patient able to complete low level Nustep and L hip stretches with minimal  facial grimacing with L hip flexor and quad stretching. Normal modalities response noted following removal of the modalities. No complaints following end of session.    Rehab Potential  Good    PT Frequency  2x / week    PT Duration  6 weeks    PT Treatment/Interventions  Therapeutic exercise;Balance training;Functional mobility training;Therapeutic activities;Patient/family education;Dry needling    PT Next Visit Plan  Hip stretches, strengthening, lumbar stretching/core strengthening, E-stim and heat    PT Home Exercise Plan  see pt instructions    Consulted and Agree with Plan of Care  Patient       Patient will benefit from skilled therapeutic intervention in order to improve the following deficits and impairments:  Abnormal gait, Decreased activity tolerance, Decreased balance, Decreased strength, Difficulty walking, Postural dysfunction, Pain  Visit Diagnosis: Pain in left hip  Muscle weakness (generalized)  Chronic bilateral low back pain with left-sided sciatica  Difficulty  in walking, not elsewhere classified     Problem List Patient Active Problem List   Diagnosis Date Noted  . Dysphagia 01/20/2017  . Left hip pain 01/19/2017  . Right hand pain 01/19/2017  . Diarrhea 10/11/2016  . Right hip pain 06/07/2016  . Dysuria 04/14/2016  . Malaise and fatigue 04/14/2016  . Breast pain, left 11/06/2015  . Mass of left forearm 10/10/2015    Class: Chronic  . Dysphagia, oropharyngeal   . Gastroesophageal reflux disease without esophagitis   . Carpal tunnel syndrome, right 07/11/2015    Class: Chronic  . Prediabetes 07/09/2015  . Lumbar stenosis with neurogenic claudication 01/05/2015  . Spondylolisthesis of lumbar region 01/03/2015    Class: Chronic  . Spinal stenosis, lumbar region, with neurogenic claudication 01/03/2015    Class: Chronic  . Soft tissue mass 07/23/2014    Class: Acute  . Coccygodynia 07/23/2014    Class: Acute  . Healthcare maintenance 03/19/2014   . Constipation 02/15/2014  . Dysphagia, pharyngoesophageal phase   . Elevated alkaline phosphatase level 11/06/2013  . Rectal bleeding 11/06/2013  . Abdominal pain 10/03/2013  . Obesity, unspecified 02/15/2013  . Genital herpes 10/12/2012  . HSV (herpes simplex virus) anogenital infection 09/29/2012  . UNSPECIFIED SLEEP APNEA 12/04/2008  . ADENOCARCINOMA, LUNG 10/09/2008  . Osteoarthritis of multiple joints 07/13/2006  . HYPERCHOLESTEROLEMIA 03/17/2006  . DEPRESSION, MAJOR, RECURRENT 03/17/2006  . HYPERTENSION, BENIGN SYSTEMIC 03/17/2006  . GASTROESOPHAGEAL REFLUX, NO ESOPHAGITIS 03/17/2006  . INSOMNIA NOS 03/17/2006    Standley Brooking, PTA 10/13/2017, 8:17 AM  [facility] [address] [place], [place], [address] Phone: [phone]   Fax:  [phone]  Name: MYSHA PEELER MRN: [id number] Date of Birth: [birth year]

## 2017-10-17 ENCOUNTER — Ambulatory Visit (INDEPENDENT_AMBULATORY_CARE_PROVIDER_SITE_OTHER): Payer: Medicare Other | Admitting: Specialist

## 2017-10-17 ENCOUNTER — Ambulatory Visit (HOSPITAL_COMMUNITY): Payer: Medicare Other

## 2017-10-18 ENCOUNTER — Encounter: Payer: Medicare Other | Admitting: Physical Therapy

## 2017-10-20 ENCOUNTER — Encounter: Payer: Medicare Other | Admitting: Physical Therapy

## 2017-10-24 ENCOUNTER — Telehealth: Payer: Self-pay | Admitting: Family Medicine

## 2017-10-24 ENCOUNTER — Ambulatory Visit (HOSPITAL_COMMUNITY)
Admission: RE | Admit: 2017-10-24 | Discharge: 2017-10-24 | Disposition: A | Discharge status: Home / Self Care | Payer: Medicare Other | Source: Ambulatory Visit | Attending: Family Medicine | Admitting: Family Medicine

## 2017-10-24 DIAGNOSIS — R55 Syncope and collapse: Secondary | ICD-10-CM | POA: Insufficient documentation

## 2017-10-24 DIAGNOSIS — I351 Nonrheumatic aortic (valve) insufficiency: Secondary | ICD-10-CM | POA: Diagnosis not present

## 2017-10-24 NOTE — Progress Notes (Signed)
  Echocardiogram 2D Echocardiogram has been performed.  [name] 10/24/2017, 8:51 AM

## 2017-10-24 NOTE — Telephone Encounter (Signed)
Pt came in office to leave a message to the MD regarding her heart ultrasound, she stated that she needs to be hooked to a heart rate monitor, stated that the rhythm is off and might be the reason why she is passing out. Pt is also asking where can she get the machine. Best phone # to contact is [phone].

## 2017-10-25 ENCOUNTER — Telehealth: Payer: Self-pay | Admitting: Family Medicine

## 2017-10-25 ENCOUNTER — Encounter: Payer: Self-pay | Admitting: Family Medicine

## 2017-10-25 DIAGNOSIS — R55 Syncope and collapse: Secondary | ICD-10-CM

## 2017-10-25 DIAGNOSIS — I5189 Other ill-defined heart diseases: Secondary | ICD-10-CM | POA: Insufficient documentation

## 2017-10-25 DIAGNOSIS — I351 Nonrheumatic aortic (valve) insufficiency: Secondary | ICD-10-CM | POA: Insufficient documentation

## 2017-10-25 NOTE — Telephone Encounter (Signed)
Pt informed of appt for holter monitor. (11/01/17 @ 12, [facility] [address]). Leyan Branden Kennon Holter, CMA

## 2017-10-25 NOTE — Telephone Encounter (Signed)
Called patient regarding echocardiogram.  Patient reports today she feels well.  She reports since seeing me earlier in September she has had 4 more episodes of 'falling out'.  I again advised her to present to the emergency room.  She refuse. She denies associated signs or symptoms.  She reports all 4 of these episodes occurred while she was sitting down watching TV.  She remembers then being woken up by her husband from sleep.  She denies chest pains difficulty breathing or palpitations.  Recommended once again that if another 1 of these episodes happened she presented to the emergency room.  Reviewed risks.  Given recurrence of episodes abnormal diastolic filling pattern and mild AR will refer to cardiology and obtain Holter monitor.

## 2017-10-26 ENCOUNTER — Encounter: Payer: Self-pay | Admitting: Physical Therapy

## 2017-10-26 ENCOUNTER — Ambulatory Visit: Payer: Medicare Other | Attending: Specialist | Admitting: Physical Therapy

## 2017-10-26 DIAGNOSIS — M5416 Radiculopathy, lumbar region: Secondary | ICD-10-CM | POA: Diagnosis not present

## 2017-10-26 DIAGNOSIS — M6281 Muscle weakness (generalized): Secondary | ICD-10-CM | POA: Diagnosis not present

## 2017-10-26 DIAGNOSIS — M25552 Pain in left hip: Secondary | ICD-10-CM

## 2017-10-26 DIAGNOSIS — G8929 Other chronic pain: Secondary | ICD-10-CM

## 2017-10-26 DIAGNOSIS — R262 Difficulty in walking, not elsewhere classified: Secondary | ICD-10-CM | POA: Diagnosis not present

## 2017-10-26 DIAGNOSIS — M5442 Lumbago with sciatica, left side: Secondary | ICD-10-CM | POA: Diagnosis not present

## 2017-10-26 NOTE — Therapy (Addendum)
[name], [place], [address] Phone: [phone]   Fax:  [phone]  Physical Therapy Treatment  Patient Details  Name: [name] MRN: [id number] Date of Birth: August 29, 1960 Referring Provider (PT): Basil Dess, MD   Encounter Date: 10/26/2017  PT End of Session - 10/26/17 0841    Visit Number  4    Number of Visits  13    Date for PT Re-Evaluation  11/29/17    Authorization Type  KX modifier after visit 15    PT Start Time  0814    PT Stop Time  0901    PT Time Calculation (min)  47 min    Activity Tolerance  Patient tolerated treatment well    Behavior During Therapy  [facility] for tasks assessed/performed       Past Medical History:  Diagnosis Date  . Anxiety   . Asthma    every now and then.  Wheezing and coughing  . Cancer ([place]) 1997   left lung  . Chronic abdominal pain   . Chronic back pain   . Chronic joint pain   . Depression   . GERD (gastroesophageal reflux disease)   . HLD (hyperlipidemia)   . HTN (hypertension)   . Nausea and vomiting    chronic, recurrent  . Osteoarthrosis involving more than one site but not generalized   . Palpitations 01/2013   chronic, intermittent  . Shortness of breath dyspnea    with exertion    Past Surgical History:  Procedure Laterality Date  . ABDOMINAL HYSTERECTOMY    . ABDOMINAL SURGERY     ovarian cyst removal  . BIOPSY N/A 11/22/2013   Procedure: GASTRIC BIOPSY;  Surgeon: Shawna Dolin, MD;  Location: [facility];  Service: Endoscopy;  Laterality: N/A;  . BLADDER SURGERY  suspension x4   x 4  . CARPAL TUNNEL RELEASE Right 07/11/2015   Procedure: RIGHT CARPAL TUNNEL RELEASE;  Surgeon: Shawna Oto, MD;  Location: [place];  Service: Orthopedics;  Laterality: Right;  . CHOLECYSTECTOMY N/A 04/05/2014   Procedure: LAPAROSCOPIC CHOLECYSTECTOMY;  Surgeon: Aviva Signs Md, MD;  Location: [facility];  Service: General;  Laterality: N/A;  .  COCCYGECTOMY N/A 04/11/2015   Procedure: COCCYGECTOMY WITH OSTEOTOMY THROUGH AREA OF DEFORMITY;  Surgeon: Shawna Oto, MD;  Location: [place];  Service: Orthopedics;  Laterality: N/A;  . COLONOSCOPY    . COLONOSCOPY WITH PROPOFOL N/A 11/22/2013   Dr. Gala Tre[place]: Grade 3 and 4/internal hemorrhoids?"likely source of hematochezia Normal colonoscopy otherwise.   Marland Kitchen COLONOSCOPY WITH PROPOFOL N/A 09/15/2017   Procedure: COLONOSCOPY WITH PROPOFOL;  Surgeon: Shawna Dolin, MD;  [facility];  Service: Endoscopy;  Laterality: N/A;  7:30am  . ESOPHAGOGASTRODUODENOSCOPY (EGD) WITH PROPOFOL N/A 11/22/2013   Dr. Gala [place]: Incomplete Schatzki's ring dilated and disrupted as described above.  Small hiatal hernia. Focally abnormal gastric mucosa of uncertain significance status post biopsy, reactive gastropathy, negative H.pylori  . ESOPHAGOGASTRODUODENOSCOPY (EGD) WITH PROPOFOL N/A 08/04/2015   Dr. Gala [place]: mild Schatzki's ring s/p dilation, small hiatal hernia   . ESOPHAGOGASTRODUODENOSCOPY (EGD) WITH PROPOFOL N/A 03/17/2017   Mild Schatki's ring s/p dilation, small hiatal hernia, otherwise normal  . EXCISION VAGINAL CYST Left 06/20/2012   Procedure: EXCISION LEFT LABIAL CYST;  Surgeon: Shawna Kind, MD;  Location: [facility];  Service: Gynecology;  Laterality: Left;  . LOBECTOMY Left   . LUNG CANCER SURGERY  1997   age 56, resection only  .  MALONEY DILATION N/A 11/22/2013   Procedure: [name] DILATION;  Surgeon: Shawna Dolin, MD;  Location: [facility];  Service: Endoscopy;  Laterality: N/A;  56  . MALONEY DILATION N/A 08/04/2015   Procedure: [name] DILATION;  Surgeon: Shawna Dolin, MD;  [facility];  Service: Endoscopy;  Laterality: N/A;  [name] DILATION N/A 03/17/2017   Procedure: [name] DILATION;  Surgeon: Shawna Dolin, MD;  [facility];  Service: Endoscopy;  Laterality: N/A;  . MASS EXCISION N/A 07/23/2014   Procedure: EXCISIONAL BIOPSY NODULE LEFT PARACOCCYGEAL AREA;  Surgeon:  Shawna Oto, MD;  Location: [place];  Service: Orthopedics;  Laterality: N/A;  . MASS EXCISION Left 10/10/2015   Procedure: EXCISIONAL BIOPSY OF LEFT DORSORADIAL FOREARM MASS LIPOMA;  Surgeon: Shawna Oto, MD;  Location: [place];  Service: Orthopedics;  Laterality: Left;  . TUBAL LIGATION      There were no vitals filed for this visit.  Subjective Assessment - 10/26/17 0819    Subjective  Patient arrived with reported ongoing discomfort and not helping yet, patient reported getting shoe lift and it seems to feel like it is starting to help some    Pertinent History  L ischial/femoral impingment, quadratus femoris strain, glutius minimus strain, h/o lung CA (20 years in remission), anxiety,  back pain, HTN, depression    Limitations  Sitting;Lifting;Standing;Walking;Other (comment);House hold activities    How long can you sit comfortably?  10 minutes    How long can you stand comfortably?  5- 10 minutes    How long can you walk comfortably?  unable to walk comfortably    Patient Stated Goals  walk without pain, be able to sleep at night    Currently in Pain?  Yes    Pain Score  7     Pain Location  Hip    Pain Orientation  Left    Pain Descriptors / Indicators  Aching;Burning;Discomfort    Pain Type  Acute pain    Pain Onset  More than a month ago    Pain Frequency  Constant    Aggravating Factors   any standing or walking for prolong time    Pain Relieving Factors  at rest and shoe lift                       [facility] Adult PT Treatment/Exercise - 10/26/17 0001      Exercises   Exercises  Lumbar;Knee/Hip      Lumbar Exercises: Aerobic   Nustep  L3 x13 min UE/LE, monitored      Lumbar Exercises: Supine   Bridge  20 reps;3 seconds      Knee/Hip Exercises: Standing   Hip Flexion  Left;2 sets;10 reps;Knee bent    Hip ADduction  Strengthening;Left;2 sets;10 reps    Hip Extension  Stengthening;Left;2 sets;10 reps;Knee straight      Knee/Hip  Exercises: Supine   Straight Leg Raises  Strengthening;2 sets;10 reps    Other Supine Knee/Hip Exercises  hip abd Left LE with red t-band 2x10      Knee/Hip Exercises: Sidelying   Hip ABduction  Strengthening;Left;2 sets;10 reps    Clams  2x10               PT Short Term Goals - 10/26/17 0626      PT SHORT TERM GOAL #1   Title  Pt will be independent in her HEP.     Time  3    Period  Weeks    Status  On-going      PT SHORT TERM GOAL #2   Title  Pt will be able to amb for 15 minutes in order to go to peform household tasks with pain </= 5/10.     Time  3    Period  Weeks    Status  On-going        PT Long Term Goals - 10/26/17 0825      PT LONG TERM GOAL #1   Title  Pt will improve her FOTO from 63% limitation to </= 48% limitation.     Time  6    Period  Weeks    Status  On-going      PT LONG TERM GOAL #2   Title  Pt will be able to amb for 30 minutes with pain </= 4/10 to peform community level distances.     Time  6    Period  Weeks    Status  On-going      PT LONG TERM GOAL #3   Title  Pt will improve her L LE Strength to grossly >/=4/5 in order to improve gait and functional mobility.     Time  6    Period  Weeks    Status  On-going            Plan - 10/26/17 0847    Clinical Impression Statement  Patient tolerated treatment well today. Patient able to progress with supine and standing exercises to strengthen left LE. Patient reported some fatigue yet did well with all activities. Patient has her shoe lift which has helped thus far when she is walking or standing. Patient feels like she may progress with shoe lift. Goals ongoing at this time due to limitations.     PT Frequency  2x / week    PT Duration  6 weeks    PT Treatment/Interventions  Therapeutic exercise;Balance training;Functional mobility training;Therapeutic activities;Patient/family education;Dry needling    PT Next Visit Plan  cont with Hip stretches, strengthening, lumbar  stretching/core strengthening, E-stim and heat    Consulted and Agree with Plan of Care  Patient       Patient will benefit from skilled therapeutic intervention in order to improve the following deficits and impairments:  Abnormal gait, Decreased activity tolerance, Decreased balance, Decreased strength, Difficulty walking, Postural dysfunction, Pain  Visit Diagnosis: Pain in left hip  Muscle weakness (generalized)  Chronic bilateral low back pain with left-sided sciatica  Difficulty in walking, not elsewhere classified  Radiculopathy, lumbar region     Problem List Patient Active Problem List   Diagnosis Date Noted  . Aortic regurgitation 10/25/2017  . Diastolic dysfunction 22/63/3354  . Dysphagia 01/20/2017  . Left hip pain 01/19/2017  . Right hand pain 01/19/2017  . Diarrhea 10/11/2016  . Right hip pain 06/07/2016  . Dysuria 04/14/2016  . Malaise and fatigue 04/14/2016  . Breast pain, left 11/06/2015  . Mass of left forearm 10/10/2015    Class: Chronic  . Dysphagia, oropharyngeal   . Gastroesophageal reflux disease without esophagitis   . Carpal tunnel syndrome, right 07/11/2015    Class: Chronic  . Prediabetes 07/09/2015  . Lumbar stenosis with neurogenic claudication 01/05/2015  . Spondylolisthesis of lumbar region 01/03/2015    Class: Chronic  . Spinal stenosis, lumbar region, with neurogenic claudication 01/03/2015    Class: Chronic  . Soft tissue mass 07/23/2014    Class: Acute  . Coccygodynia  07/23/2014    Class: Acute  . Healthcare maintenance 03/19/2014  . Constipation 02/15/2014  . Dysphagia, pharyngoesophageal phase   . Elevated alkaline phosphatase level 11/06/2013  . Rectal bleeding 11/06/2013  . Abdominal pain 10/03/2013  . Obesity, unspecified 02/15/2013  . Genital herpes 10/12/2012  . HSV (herpes simplex virus) anogenital infection 09/29/2012  . UNSPECIFIED SLEEP APNEA 12/04/2008  . ADENOCARCINOMA, LUNG 10/09/2008  . Osteoarthritis of  multiple joints 07/13/2006  . HYPERCHOLESTEROLEMIA 03/17/2006  . DEPRESSION, MAJOR, RECURRENT 03/17/2006  . HYPERTENSION, BENIGN SYSTEMIC 03/17/2006  . GASTROESOPHAGEAL REFLUX, NO ESOPHAGITIS 03/17/2006  . INSOMNIA NOS 03/17/2006    Naftoli Penny P, PTA 10/26/2017, 9:45 AM  [facility] [place], [place], [address] Phone: [phone]   Fax:  [phone]  Name: SHAILI DONALSON MRN: [id number] Date of Birth: 08-May-1960  PHYSICAL THERAPY DISCHARGE SUMMARY  Visits from Start of Care: 4.  Current functional level related to goals / functional outcomes: See above.   Remaining deficits: See below.   Education / Equipment: HEP. Plan: Patient agrees to discharge.  Patient goals were not met. Patient is being discharged due to not returning since the last visit.  ?????         Mali Applegate MPT

## 2017-10-26 NOTE — Telephone Encounter (Signed)
Issue resolved, please see Dr. Saul Fordyce note on 10/8.  Guadalupe Dawn MD PGY-2 Family Medicine Resident

## 2017-10-28 ENCOUNTER — Encounter: Payer: Medicare Other | Admitting: Physical Therapy

## 2017-11-01 ENCOUNTER — Ambulatory Visit (INDEPENDENT_AMBULATORY_CARE_PROVIDER_SITE_OTHER): Payer: Medicare Other

## 2017-11-01 DIAGNOSIS — R55 Syncope and collapse: Secondary | ICD-10-CM

## 2017-11-03 ENCOUNTER — Telehealth: Payer: Self-pay | Admitting: Internal Medicine

## 2017-11-03 DIAGNOSIS — K5900 Constipation, unspecified: Secondary | ICD-10-CM

## 2017-11-03 MED ORDER — LUBIPROSTONE 24 MCG PO CAPS: 24.0000 ug | ORAL_CAPSULE | Freq: Two times a day (BID) | ORAL | 3 refills | Status: DC

## 2017-11-03 NOTE — Addendum Note (Signed)
Addended by: Gordy Levan, Rollyn Scialdone A on: 11/03/2017 04:50 PM   Modules accepted: Orders

## 2017-11-03 NOTE — Telephone Encounter (Signed)
PATIENT WAS TOLD TO TAKE AMITEZA TWICE A DAY AND HER PRESCRIPTION NEEDS TO BE SENT IN FOR THIS.  SHE HAS RAN OUT AND HAS BEEN OUT FOR 2 WEEKS.  [phone]

## 2017-11-03 NOTE — Telephone Encounter (Signed)
Routing...

## 2017-11-03 NOTE — Telephone Encounter (Signed)
Rx sent to pharmacy per patient request. 

## 2017-11-03 NOTE — Telephone Encounter (Signed)
Pt would like a refill of Amitiza sent to her pahrmacy. Routing message.

## 2017-11-04 NOTE — Telephone Encounter (Signed)
Pt notified that medication was sent into her pharmacy.

## 2017-11-08 ENCOUNTER — Encounter: Payer: Medicare Other | Admitting: Internal Medicine

## 2017-11-09 ENCOUNTER — Ambulatory Visit (INDEPENDENT_AMBULATORY_CARE_PROVIDER_SITE_OTHER): Payer: Medicare Other | Admitting: Specialist

## 2017-11-09 ENCOUNTER — Telehealth: Payer: Self-pay

## 2017-11-09 DIAGNOSIS — G4719 Other hypersomnia: Secondary | ICD-10-CM

## 2017-11-09 NOTE — Telephone Encounter (Signed)
Patient left voicemail asking if results have been received for Holter Monitor.  Call back is [phone]  Danley Danker, RN [facility] RN)

## 2017-11-09 NOTE — Telephone Encounter (Signed)
Called patient regarding results.  The patient had multiple episodes of which she is described as passing out in the past during this time.  The results were normal.  She reports her newest complaint is very significant sleepiness.  She reports she snores nightly.  Her husband has recently observed apneas while she is sleeping.  She did have a normal sleep study for excessive daytime sleepiness back in 2010 that was normal.  Has a history of hypertension also placed in her increased.  Order for sleep study placed could also consider narcolepsy given that she seems to have a short latency to her sleep onset.

## 2017-11-14 ENCOUNTER — Other Ambulatory Visit: Payer: Self-pay

## 2017-11-14 DIAGNOSIS — M48062 Spinal stenosis, lumbar region with neurogenic claudication: Secondary | ICD-10-CM

## 2017-11-14 MED ORDER — AMITRIPTYLINE HCL 150 MG PO TABS: ORAL_TABLET | ORAL | 5 refills | Status: DC

## 2017-11-16 ENCOUNTER — Ambulatory Visit: Payer: Medicare Other | Admitting: Gastroenterology

## 2017-11-22 ENCOUNTER — Telehealth: Payer: Self-pay

## 2017-11-22 NOTE — Telephone Encounter (Signed)
Patient called to check on status of sleep study referral.  Danley Danker, RN [facility] RN)

## 2017-11-28 ENCOUNTER — Other Ambulatory Visit: Payer: Self-pay

## 2017-11-28 MED ORDER — MELOXICAM 7.5 MG PO TABS: 15.0000 mg | ORAL_TABLET | Freq: Every day | ORAL | 0 refills | Status: DC

## 2017-12-10 ENCOUNTER — Emergency Department (HOSPITAL_COMMUNITY): Payer: Medicare Other

## 2017-12-10 ENCOUNTER — Encounter (HOSPITAL_COMMUNITY): Payer: Self-pay | Admitting: Emergency Medicine

## 2017-12-10 ENCOUNTER — Inpatient Hospital Stay (HOSPITAL_COMMUNITY): Payer: Medicare Other

## 2017-12-10 ENCOUNTER — Other Ambulatory Visit: Payer: Self-pay

## 2017-12-10 ENCOUNTER — Inpatient Hospital Stay (HOSPITAL_COMMUNITY)
Admission: EM | Admit: 2017-12-10 | Discharge: 2017-12-15 | DRG: 871 | Disposition: A | Discharge status: Home Health | Payer: Medicare Other | Attending: Internal Medicine | Admitting: Internal Medicine

## 2017-12-10 DIAGNOSIS — Z79899 Other long term (current) drug therapy: Secondary | ICD-10-CM

## 2017-12-10 DIAGNOSIS — Z90710 Acquired absence of both cervix and uterus: Secondary | ICD-10-CM

## 2017-12-10 DIAGNOSIS — N179 Acute kidney failure, unspecified: Secondary | ICD-10-CM | POA: Diagnosis present

## 2017-12-10 DIAGNOSIS — Z888 Allergy status to other drugs, medicaments and biological substances status: Secondary | ICD-10-CM

## 2017-12-10 DIAGNOSIS — W1830XA Fall on same level, unspecified, initial encounter: Secondary | ICD-10-CM | POA: Diagnosis present

## 2017-12-10 DIAGNOSIS — F329 Major depressive disorder, single episode, unspecified: Secondary | ICD-10-CM | POA: Diagnosis present

## 2017-12-10 DIAGNOSIS — E8770 Fluid overload, unspecified: Secondary | ICD-10-CM | POA: Diagnosis present

## 2017-12-10 DIAGNOSIS — J440 Chronic obstructive pulmonary disease with acute lower respiratory infection: Secondary | ICD-10-CM | POA: Diagnosis present

## 2017-12-10 DIAGNOSIS — R17 Unspecified jaundice: Secondary | ICD-10-CM | POA: Diagnosis not present

## 2017-12-10 DIAGNOSIS — F419 Anxiety disorder, unspecified: Secondary | ICD-10-CM | POA: Diagnosis present

## 2017-12-10 DIAGNOSIS — K567 Ileus, unspecified: Secondary | ICD-10-CM | POA: Diagnosis present

## 2017-12-10 DIAGNOSIS — E785 Hyperlipidemia, unspecified: Secondary | ICD-10-CM | POA: Diagnosis not present

## 2017-12-10 DIAGNOSIS — I10 Essential (primary) hypertension: Secondary | ICD-10-CM | POA: Diagnosis present

## 2017-12-10 DIAGNOSIS — Z8249 Family history of ischemic heart disease and other diseases of the circulatory system: Secondary | ICD-10-CM

## 2017-12-10 DIAGNOSIS — E869 Volume depletion, unspecified: Secondary | ICD-10-CM | POA: Diagnosis present

## 2017-12-10 DIAGNOSIS — R0603 Acute respiratory distress: Secondary | ICD-10-CM | POA: Diagnosis not present

## 2017-12-10 DIAGNOSIS — K56600 Partial intestinal obstruction, unspecified as to cause: Secondary | ICD-10-CM | POA: Diagnosis present

## 2017-12-10 DIAGNOSIS — J441 Chronic obstructive pulmonary disease with (acute) exacerbation: Secondary | ICD-10-CM | POA: Diagnosis present

## 2017-12-10 DIAGNOSIS — K222 Esophageal obstruction: Secondary | ICD-10-CM | POA: Diagnosis present

## 2017-12-10 DIAGNOSIS — T380X5A Adverse effect of glucocorticoids and synthetic analogues, initial encounter: Secondary | ICD-10-CM | POA: Diagnosis present

## 2017-12-10 DIAGNOSIS — R739 Hyperglycemia, unspecified: Secondary | ICD-10-CM | POA: Diagnosis present

## 2017-12-10 DIAGNOSIS — K219 Gastro-esophageal reflux disease without esophagitis: Secondary | ICD-10-CM | POA: Diagnosis present

## 2017-12-10 DIAGNOSIS — E876 Hypokalemia: Secondary | ICD-10-CM | POA: Diagnosis present

## 2017-12-10 DIAGNOSIS — G8929 Other chronic pain: Secondary | ICD-10-CM | POA: Diagnosis present

## 2017-12-10 DIAGNOSIS — A419 Sepsis, unspecified organism: Principal | ICD-10-CM | POA: Diagnosis present

## 2017-12-10 DIAGNOSIS — J181 Lobar pneumonia, unspecified organism: Secondary | ICD-10-CM | POA: Diagnosis present

## 2017-12-10 DIAGNOSIS — J189 Pneumonia, unspecified organism: Secondary | ICD-10-CM

## 2017-12-10 DIAGNOSIS — R0602 Shortness of breath: Secondary | ICD-10-CM | POA: Diagnosis not present

## 2017-12-10 DIAGNOSIS — J9601 Acute respiratory failure with hypoxia: Secondary | ICD-10-CM

## 2017-12-10 DIAGNOSIS — R000 Tachycardia, unspecified: Secondary | ICD-10-CM | POA: Diagnosis present

## 2017-12-10 DIAGNOSIS — Z9049 Acquired absence of other specified parts of digestive tract: Secondary | ICD-10-CM

## 2017-12-10 DIAGNOSIS — M1990 Unspecified osteoarthritis, unspecified site: Secondary | ICD-10-CM | POA: Diagnosis present

## 2017-12-10 DIAGNOSIS — Z87891 Personal history of nicotine dependence: Secondary | ICD-10-CM

## 2017-12-10 DIAGNOSIS — R1084 Generalized abdominal pain: Secondary | ICD-10-CM | POA: Diagnosis not present

## 2017-12-10 DIAGNOSIS — R6520 Severe sepsis without septic shock: Secondary | ICD-10-CM | POA: Diagnosis present

## 2017-12-10 DIAGNOSIS — Z85118 Personal history of other malignant neoplasm of bronchus and lung: Secondary | ICD-10-CM

## 2017-12-10 DIAGNOSIS — R531 Weakness: Secondary | ICD-10-CM | POA: Diagnosis not present

## 2017-12-10 DIAGNOSIS — Z791 Long term (current) use of non-steroidal anti-inflammatories (NSAID): Secondary | ICD-10-CM

## 2017-12-10 LAB — COMPREHENSIVE METABOLIC PANEL
ALT: 17 U/L (ref 0–44)
ANION GAP: 16 — AB (ref 5–15)
AST: 25 U/L (ref 15–41)
Albumin: 3.4 g/dL — ABNORMAL LOW (ref 3.5–5.0)
Alkaline Phosphatase: 131 U/L — ABNORMAL HIGH (ref 38–126)
BUN: 24 mg/dL — ABNORMAL HIGH (ref 6–20)
CHLORIDE: 97 mmol/L — AB (ref 98–111)
CO2: 20 mmol/L — AB (ref 22–32)
Calcium: 8.2 mg/dL — ABNORMAL LOW (ref 8.9–10.3)
Creatinine, Ser: 1.57 mg/dL — ABNORMAL HIGH (ref 0.44–1.00)
GFR calc Af Amer: 42 mL/min — ABNORMAL LOW (ref >60)
GFR calc non Af Amer: 36 mL/min — ABNORMAL LOW (ref >60)
GLUCOSE: 133 mg/dL — AB (ref 70–99)
POTASSIUM: 3.2 mmol/L — AB (ref 3.5–5.1)
SODIUM: 133 mmol/L — AB (ref 135–145)
Total Bilirubin: 5.8 mg/dL — ABNORMAL HIGH (ref 0.3–1.2)
Total Protein: 7.9 g/dL (ref 6.5–8.1)

## 2017-12-10 LAB — APTT: APTT: 46 s — AB (ref 24–36)

## 2017-12-10 LAB — CBC WITH DIFFERENTIAL/PLATELET
Abs Immature Granulocytes: 0.03 10*3/uL (ref 0.00–0.07)
BASOS ABS: 0 10*3/uL (ref 0.0–0.1)
BASOS PCT: 0 %
EOS PCT: 2 %
Eosinophils Absolute: 0.1 10*3/uL (ref 0.0–0.5)
HCT: 39.1 % (ref 36.0–46.0)
HEMOGLOBIN: 13 g/dL (ref 12.0–15.0)
Immature Granulocytes: 1 %
Lymphocytes Relative: 10 %
Lymphs Abs: 0.5 10*3/uL — ABNORMAL LOW (ref 0.7–4.0)
MCH: 30 pg (ref 26.0–34.0)
MCHC: 33.2 g/dL (ref 30.0–36.0)
MCV: 90.1 fL (ref 80.0–100.0)
Monocytes Absolute: 0.2 10*3/uL (ref 0.1–1.0)
Monocytes Relative: 4 %
NEUTROS PCT: 83 %
NRBC: 0 % (ref 0.0–0.2)
Neutro Abs: 4.6 10*3/uL (ref 1.7–7.7)
PLATELETS: 211 10*3/uL (ref 150–400)
RBC: 4.34 MIL/uL (ref 3.87–5.11)
RDW: 12.2 % (ref 11.5–15.5)
WBC: 5.5 10*3/uL (ref 4.0–10.5)

## 2017-12-10 LAB — URINALYSIS, ROUTINE W REFLEX MICROSCOPIC
Glucose, UA: NEGATIVE mg/dL
Ketones, ur: NEGATIVE mg/dL
Leukocytes, UA: NEGATIVE
Nitrite: NEGATIVE
PROTEIN: 30 mg/dL — AB
Specific Gravity, Urine: 1.018 (ref 1.005–1.030)
pH: 5 (ref 5.0–8.0)

## 2017-12-10 LAB — BILIRUBIN, FRACTIONATED(TOT/DIR/INDIR)
BILIRUBIN INDIRECT: 1.9 mg/dL — AB (ref 0.3–0.9)
BILIRUBIN TOTAL: 4.7 mg/dL — AB (ref 0.3–1.2)
Bilirubin, Direct: 2.8 mg/dL — ABNORMAL HIGH (ref 0.0–0.2)

## 2017-12-10 LAB — PROCALCITONIN: Procalcitonin: 47.36 ng/mL

## 2017-12-10 LAB — BLOOD GAS, ARTERIAL
Acid-base deficit: 3.8 mmol/L — ABNORMAL HIGH (ref 0.0–2.0)
BICARBONATE: 22 mmol/L (ref 20.0–28.0)
DRAWN BY: 221791
O2 CONTENT: 3 L/min
O2 SAT: 96.3 %
PO2 ART: 82.9 mmHg — AB (ref 83.0–108.0)
pCO2 arterial: 25.5 mmHg — ABNORMAL LOW (ref 32.0–48.0)
pH, Arterial: 7.487 — ABNORMAL HIGH (ref 7.350–7.450)

## 2017-12-10 LAB — TROPONIN I: Troponin I: <0.03 ng/mL (ref <0.03)

## 2017-12-10 LAB — HEMOGLOBIN A1C
HEMOGLOBIN A1C: 5.5 % (ref 4.8–5.6)
Mean Plasma Glucose: 111.15 mg/dL

## 2017-12-10 LAB — INFLUENZA PANEL BY PCR (TYPE A & B)
INFLBPCR: NEGATIVE
Influenza A By PCR: NEGATIVE

## 2017-12-10 LAB — LACTIC ACID, PLASMA
LACTIC ACID, VENOUS: 4 mmol/L — AB (ref 0.5–1.9)
Lactic Acid, Venous: 5.6 mmol/L (ref 0.5–1.9)
Lactic Acid, Venous: 3.3 mmol/L (ref 0.5–1.9)
Lactic Acid, Venous: 3.9 mmol/L (ref 0.5–1.9)
Lactic Acid, Venous: 3.7 mmol/L (ref 0.5–1.9)

## 2017-12-10 LAB — STREP PNEUMONIAE URINARY ANTIGEN: Strep Pneumo Urinary Antigen: POSITIVE — AB

## 2017-12-10 LAB — MRSA PCR SCREENING: MRSA by PCR: NEGATIVE

## 2017-12-10 LAB — PROTIME-INR
INR: 1.48
Prothrombin Time: 17.7 seconds — ABNORMAL HIGH (ref 11.4–15.2)

## 2017-12-10 LAB — LIPASE, BLOOD: Lipase: 17 U/L (ref 11–51)

## 2017-12-10 LAB — MAGNESIUM: MAGNESIUM: 1.2 mg/dL — AB (ref 1.7–2.4)

## 2017-12-10 LAB — CK: CK TOTAL: 112 U/L (ref 38–234)

## 2017-12-10 MED ORDER — ONDANSETRON HCL 4 MG/2ML IJ SOLN: 4.0000 mg | Freq: Four times a day (QID) | INTRAMUSCULAR | Status: DC | PRN

## 2017-12-10 MED ORDER — SODIUM CHLORIDE 0.9 % IV SOLN
500.0000 mg | Freq: Once | INTRAVENOUS | Status: AC
  Administered 2017-12-10: 500 mg via INTRAVENOUS
  Filled 2017-12-10: qty 500

## 2017-12-10 MED ORDER — POTASSIUM CHLORIDE IN NACL 20-0.9 MEQ/L-% IV SOLN
INTRAVENOUS | Status: DC
  Administered 2017-12-10: 14:00:00 via INTRAVENOUS

## 2017-12-10 MED ORDER — SODIUM CHLORIDE 0.9% FLUSH
3.0000 mL | INTRAVENOUS | Status: DC | PRN
  Administered 2017-12-10: 3 mL via INTRAVENOUS
  Filled 2017-12-10: qty 3

## 2017-12-10 MED ORDER — SODIUM CHLORIDE 0.9 % IV SOLN
500.0000 mg | INTRAVENOUS | Status: DC
  Administered 2017-12-11 – 2017-12-13 (×3): 500 mg via INTRAVENOUS
  Filled 2017-12-10 (×5): qty 500

## 2017-12-10 MED ORDER — SODIUM CHLORIDE 0.9% FLUSH
3.0000 mL | Freq: Two times a day (BID) | INTRAVENOUS | Status: DC
  Administered 2017-12-10 – 2017-12-14 (×8): 3 mL via INTRAVENOUS

## 2017-12-10 MED ORDER — PANTOPRAZOLE SODIUM 40 MG PO TBEC
40.0000 mg | DELAYED_RELEASE_TABLET | Freq: Every day | ORAL | Status: DC
  Administered 2017-12-10 – 2017-12-15 (×6): 40 mg via ORAL
  Filled 2017-12-10 (×6): qty 1

## 2017-12-10 MED ORDER — AMITRIPTYLINE HCL 25 MG PO TABS
150.0000 mg | ORAL_TABLET | Freq: Every day | ORAL | Status: DC
  Administered 2017-12-10 – 2017-12-12 (×3): 150 mg via ORAL
  Filled 2017-12-10 (×3): qty 6

## 2017-12-10 MED ORDER — ACETAMINOPHEN 650 MG RE SUPP: 650.0000 mg | Freq: Four times a day (QID) | RECTAL | Status: DC | PRN

## 2017-12-10 MED ORDER — BACLOFEN 10 MG PO TABS: 10.0000 mg | ORAL_TABLET | Freq: Every day | ORAL | Status: DC | PRN

## 2017-12-10 MED ORDER — SODIUM CHLORIDE 0.9 % IV BOLUS
1000.0000 mL | Freq: Once | INTRAVENOUS | Status: AC
  Administered 2017-12-10: 1000 mL via INTRAVENOUS

## 2017-12-10 MED ORDER — SODIUM CHLORIDE 0.9 % IV BOLUS (SEPSIS)
1000.0000 mL | Freq: Once | INTRAVENOUS | Status: AC
  Administered 2017-12-10: 1000 mL via INTRAVENOUS

## 2017-12-10 MED ORDER — IPRATROPIUM-ALBUTEROL 0.5-2.5 (3) MG/3ML IN SOLN
3.0000 mL | Freq: Four times a day (QID) | RESPIRATORY_TRACT | Status: DC
  Administered 2017-12-10 – 2017-12-11 (×3): 3 mL via RESPIRATORY_TRACT
  Filled 2017-12-10 (×3): qty 3

## 2017-12-10 MED ORDER — PREGABALIN 50 MG PO CAPS
100.0000 mg | ORAL_CAPSULE | Freq: Three times a day (TID) | ORAL | Status: DC
  Administered 2017-12-10 – 2017-12-15 (×15): 100 mg via ORAL
  Filled 2017-12-10 (×3): qty 2
  Filled 2017-12-10 (×6): qty 1
  Filled 2017-12-10: qty 2
  Filled 2017-12-10: qty 1
  Filled 2017-12-10: qty 2
  Filled 2017-12-10 (×2): qty 1
  Filled 2017-12-10: qty 2

## 2017-12-10 MED ORDER — ATORVASTATIN CALCIUM 40 MG PO TABS
40.0000 mg | ORAL_TABLET | Freq: Every day | ORAL | Status: DC
  Administered 2017-12-10 – 2017-12-15 (×7): 40 mg via ORAL
  Filled 2017-12-10 (×7): qty 1

## 2017-12-10 MED ORDER — ACETAMINOPHEN 325 MG PO TABS: 650.0000 mg | ORAL_TABLET | Freq: Four times a day (QID) | ORAL | Status: DC | PRN

## 2017-12-10 MED ORDER — SODIUM CHLORIDE 0.9% FLUSH
3.0000 mL | Freq: Two times a day (BID) | INTRAVENOUS | Status: DC
  Administered 2017-12-13 – 2017-12-14 (×2): 3 mL via INTRAVENOUS

## 2017-12-10 MED ORDER — SODIUM CHLORIDE 0.9 % IV BOLUS (SEPSIS)
500.0000 mL | Freq: Once | INTRAVENOUS | Status: AC
  Administered 2017-12-10: 500 mL via INTRAVENOUS

## 2017-12-10 MED ORDER — METHYLPREDNISOLONE SODIUM SUCC 125 MG IJ SOLR
125.0000 mg | Freq: Once | INTRAMUSCULAR | Status: AC
  Administered 2017-12-10: 125 mg via INTRAVENOUS
  Filled 2017-12-10: qty 2

## 2017-12-10 MED ORDER — ONDANSETRON HCL 4 MG PO TABS
4.0000 mg | ORAL_TABLET | Freq: Four times a day (QID) | ORAL | Status: DC | PRN
  Administered 2017-12-10: 4 mg via ORAL
  Filled 2017-12-10: qty 1

## 2017-12-10 MED ORDER — ENOXAPARIN SODIUM 40 MG/0.4ML ~~LOC~~ SOLN
40.0000 mg | SUBCUTANEOUS | Status: DC
  Administered 2017-12-10 – 2017-12-14 (×5): 40 mg via SUBCUTANEOUS
  Filled 2017-12-10 (×5): qty 0.4

## 2017-12-10 MED ORDER — LUBIPROSTONE 24 MCG PO CAPS
24.0000 ug | ORAL_CAPSULE | Freq: Two times a day (BID) | ORAL | Status: DC
  Administered 2017-12-10 – 2017-12-15 (×10): 24 ug via ORAL
  Filled 2017-12-10 (×10): qty 1

## 2017-12-10 MED ORDER — FUROSEMIDE 10 MG/ML IJ SOLN
40.0000 mg | Freq: Once | INTRAMUSCULAR | Status: AC
  Administered 2017-12-10: 40 mg via INTRAVENOUS
  Filled 2017-12-10: qty 4

## 2017-12-10 MED ORDER — SODIUM CHLORIDE 0.9 % IV SOLN
1.0000 g | Freq: Once | INTRAVENOUS | Status: AC
  Administered 2017-12-10: 1 g via INTRAVENOUS
  Filled 2017-12-10: qty 1

## 2017-12-10 MED ORDER — SODIUM CHLORIDE 0.9 % IV SOLN
1.0000 g | Freq: Once | INTRAVENOUS | Status: AC
  Administered 2017-12-10: 1 g via INTRAVENOUS
  Filled 2017-12-10: qty 10

## 2017-12-10 MED ORDER — DULOXETINE HCL 60 MG PO CPEP
60.0000 mg | ORAL_CAPSULE | Freq: Two times a day (BID) | ORAL | Status: DC
  Administered 2017-12-10 – 2017-12-15 (×10): 60 mg via ORAL
  Filled 2017-12-10 (×11): qty 1

## 2017-12-10 MED ORDER — SODIUM CHLORIDE 0.9 % IV SOLN
2.0000 g | INTRAVENOUS | Status: DC
  Administered 2017-12-11 – 2017-12-15 (×5): 2 g via INTRAVENOUS
  Filled 2017-12-10: qty 20
  Filled 2017-12-10 (×2): qty 2
  Filled 2017-12-10 (×2): qty 20
  Filled 2017-12-10: qty 2
  Filled 2017-12-10: qty 20
  Filled 2017-12-10: qty 2

## 2017-12-10 MED ORDER — IOPAMIDOL (ISOVUE-300) INJECTION 61%
INTRAVENOUS | Status: AC
  Administered 2017-12-10: 14:00:00
  Filled 2017-12-10: qty 50

## 2017-12-10 NOTE — Progress Notes (Signed)
[name], mid-level notified of lactic acid 3.7

## 2017-12-10 NOTE — ED Notes (Signed)
Lawrence informed to check on second lactic acid drawing.

## 2017-12-10 NOTE — Progress Notes (Signed)
Critical Lab lactic acid increased from 3.3 to 3.9

## 2017-12-10 NOTE — H&P (Signed)
History and Physical  [name] UVO:[id number] DOB: 1960/06/04 DOA: 12/10/2017   PCP: Guadalupe Dawn, MD   Patient coming from: Home  Chief Complaint: generalized weakness and sob  HPI:  [name] is a 57 y.o. female with medical history of hypertension, hyperlipidemia, constipation, left lung cancer 1997 status post wedge resection presenting with 4-day history of generalized weakness, shortness of breath, dizziness, and decreased oral intake.  The patient had been in her usual health until 12/06/2017 when she complained of the above symptoms.  Patient's had associated subjective fevers and chills and nonproductive cough.  She had some nausea but denied any vomiting or diarrhea.  The patient took 2 Aleve on 12/06/2017, but has not taken any NSAIDs since then.  She denies any other new medications.  On the evening of 12/09/2017,, the patient sustained a mechanical fall secondary to her worsening generalized weakness.  The patient stated that she was on her way to the bathroom on her legs gave out.  She gives a history of increasing falls in the past week secondary to her generalized weakness.  She denies any syncope.  The patient has been complaining of a sore throat but denies any headache or neck pain.  She is also been complaining of upper abdominal tenderness in the epigastric and left upper quadrant and right upper quadrant areas.  The patient did have one episode of emesis in the emergency department, but also states that she feels hungry and wants to eat and drink.  She denies any diarrhea, hematochezia, melena, dysuria, hematuria.  In the emergency department, the patient was afebrile but was tachycardic up to heart rate of 140.  She was hemodynamically stable.  Initial oxygen saturation was 94% on room air, but was placed on supplemental oxygen for comfort.  BMP showed sodium 133 with potassium 3.2 and serum creatinine 1.57.  CBC was essentially unremarkable with WBC 5.5.  Hepatic  enzymes showed total bilirubin of 5.8 with alk phosphatase 131, AST 25, ALT 17.  Lipase was 17.  The patient was started on azithromycin and ceftriaxone after chest x-ray revealed right upper lobe consolidation.  Lactic acid was noted to be 5.6.  The patient was started on IV fluids.  Assessment/Plan: Sepsis -Secondary to pneumonia -Lactic acid 5.6 -The patient received the appropriate fluid boluses in the emergency department -Continue IV fluids -Follow blood cultures -UA and urine culture -Check procalcitonin -Continue ceftriaxone and azithromycin pending culture data  Lobar pneumonia -Urine Legionella antigen -Urine Streptococcus pneumoniae antigen -Given the patient's somewhat atypical presentation and appearance on chest x-ray, obtain CT chest -Start duo nebs -Viral respiratory panel -given pt's hx of dysphagia-->speech therapy eval  AKI -Secondary to sepsis and volume depletion -Baseline creatinine 0.8-0.9 -Continue IV fluids -Check CPK -Discontinue meloxicam  Hyperbilirubinemia -Fractionate bilirubin -CT abdomen pelvis  Essential hypertension -Holding amlodipine secondary to soft blood pressure  Chronic pain -Continue home doses of Lyrica and Cymbalta  Hyperlipidemia -Continue statin  GERD/Schatzki's ring -Continue PPI  Hypokalemia -Replete -Check magnesium  Hyperglycemia -Hemoglobin A1c      Past Medical History:  Diagnosis Date  . Anxiety   . Asthma    every now and then.  Wheezing and coughing  . Cancer ([place]) 1997   left lung  . Chronic abdominal pain   . Chronic back pain   . Chronic joint pain   . Depression   . GERD (gastroesophageal reflux disease)   . HLD (hyperlipidemia)   . HTN (hypertension)   .  Nausea and vomiting    chronic, recurrent  . Osteoarthrosis involving more than one site but not generalized   . Palpitations 01/2013   chronic, intermittent  . Shortness of breath dyspnea    with exertion   Past Surgical History:    Procedure Laterality Date  . ABDOMINAL HYSTERECTOMY    . ABDOMINAL SURGERY     ovarian cyst removal  . BIOPSY N/A 11/22/2013   Procedure: GASTRIC BIOPSY;  Surgeon: Daneil Dolin, MD;  Location: [facility];  Service: Endoscopy;  Laterality: N/A;  . BLADDER SURGERY  suspension x4   x 4  . CARPAL TUNNEL RELEASE Right 07/11/2015   Procedure: RIGHT CARPAL TUNNEL RELEASE;  Surgeon: Jessy Oto, MD;  Location: [place];  Service: Orthopedics;  Laterality: Right;  . CHOLECYSTECTOMY N/A 04/05/2014   Procedure: LAPAROSCOPIC CHOLECYSTECTOMY;  Surgeon: Aviva Signs Md, MD;  Location: [facility];  Service: General;  Laterality: N/A;  . COCCYGECTOMY N/A 04/11/2015   Procedure: COCCYGECTOMY WITH OSTEOTOMY THROUGH AREA OF DEFORMITY;  Surgeon: Jessy Oto, MD;  Location: [place];  Service: Orthopedics;  Laterality: N/A;  . COLONOSCOPY    . COLONOSCOPY WITH PROPOFOL N/A 11/22/2013   Dr. Gala Romney: Grade 3 and 4/internal hemorrhoids?"likely source of hematochezia Normal colonoscopy otherwise.   Marland Kitchen COLONOSCOPY WITH PROPOFOL N/A 09/15/2017   Procedure: COLONOSCOPY WITH PROPOFOL;  Surgeon: Daneil Dolin, MD;  [facility];  Service: Endoscopy;  Laterality: N/A;  7:30am  . ESOPHAGOGASTRODUODENOSCOPY (EGD) WITH PROPOFOL N/A 11/22/2013   Dr. Gala Romney: Incomplete Schatzki's ring dilated and disrupted as described above.  Small hiatal hernia. Focally abnormal gastric mucosa of uncertain significance status post biopsy, reactive gastropathy, negative H.pylori  . ESOPHAGOGASTRODUODENOSCOPY (EGD) WITH PROPOFOL N/A 08/04/2015   Dr. Gala Romney: mild Schatzki's ring s/p dilation, small hiatal hernia   . ESOPHAGOGASTRODUODENOSCOPY (EGD) WITH PROPOFOL N/A 03/17/2017   Mild Schatki's ring s/p dilation, small hiatal hernia, otherwise normal  . EXCISION VAGINAL CYST Left 06/20/2012   Procedure: EXCISION LEFT LABIAL CYST;  Surgeon: Jonnie Kind, MD;  Location: [facility];  Service: Gynecology;  Laterality: Left;  . LOBECTOMY  Left   . LUNG CANCER SURGERY  1997   age 24, resection only  . MALONEY DILATION N/A 11/22/2013   Procedure: [name] DILATION;  Surgeon: Daneil Dolin, MD;  Location: [facility];  Service: Endoscopy;  Laterality: N/A;  56  . MALONEY DILATION N/A 08/04/2015   Procedure: [name] DILATION;  Surgeon: Daneil Dolin, MD;  [facility];  Service: Endoscopy;  Laterality: N/A;  [name] DILATION N/A 03/17/2017   Procedure: [name] DILATION;  Surgeon: Daneil Dolin, MD;  [facility];  Service: Endoscopy;  Laterality: N/A;  . MASS EXCISION N/A 07/23/2014   Procedure: EXCISIONAL BIOPSY NODULE LEFT PARACOCCYGEAL AREA;  Surgeon: Jessy Oto, MD;  Location: [place];  Service: Orthopedics;  Laterality: N/A;  . MASS EXCISION Left 10/10/2015   Procedure: EXCISIONAL BIOPSY OF LEFT DORSORADIAL FOREARM MASS LIPOMA;  Surgeon: Jessy Oto, MD;  Location: [place];  Service: Orthopedics;  Laterality: Left;  . TUBAL LIGATION     Social History:  reports that she quit smoking about 22 years ago. Her smoking use included cigarettes. She has a 37.50 pack-year smoking history. She has never used smokeless tobacco. She reports that she does not drink alcohol or use drugs.   Family History  Problem Relation Age of Onset  . Diabetes Other   . Heart  disease Other        has Psychologist, forensic  . Hypertension Mother   . Kidney disease Mother   . Hypertension Father   . Kidney disease Father   . Heart disease Father   . Cancer Father        leukemia  . Hypertension Sister   . Hypertension Sister   . Colon cancer Neg Hx      Allergies  Allergen Reactions  . Promethazine Hcl Nausea And Vomiting and Other (See Comments)    "Makes my head hurt really bad too"     Prior to Admission medications   Medication Sig Start Date End Date Taking? Authorizing Provider  acyclovir (ZOVIRAX) 400 MG tablet TAKE 1 TABLET BY MOUTH TWICE DAILY Patient taking differently: Take 400 mg by mouth 2  (two) times daily.  05/30/17   Verner Mould, MD  albuterol [facility] HFA) [phone] Base) MCG/ACT inhaler Inhale 2 puffs into the lungs every 6 (six) hours as needed for wheezing or shortness of breath. 11/02/16   Verner Mould, MD  amitriptyline (ELAVIL) 150 MG tablet TAKE 1 TABLET BY MOUTH ONCE DAILY AT NIGHT 11/14/17   Guadalupe Dawn, MD  amLODipine (NORVASC) 10 MG tablet Take 1 tablet (10 mg total) by mouth daily. 10/13/17   Martyn Malay, MD  atorvastatin (LIPITOR) 40 MG tablet Take 1 tablet (40 mg total) by mouth daily. 02/16/17   Verner Mould, MD  baclofen (LIORESAL) 10 MG tablet Take 1 tablet (10 mg total) by mouth as needed. Patient taking differently: Take 10 mg by mouth daily as needed for muscle spasms.  08/18/17   Guadalupe Dawn, MD  dexlansoprazole (DEXILANT) 60 MG capsule Take 1 capsule (60 mg total) by mouth daily before breakfast. 09/14/17   Mahala Menghini, PA-C  diclofenac sodium (VOLTAREN) 1 % GEL Apply 4 g topically 4 (four) times daily. Patient taking differently: Apply 4 g topically 4 (four) times daily as needed (pain).  08/18/17   Guadalupe Dawn, MD  DULoxetine (CYMBALTA) 60 MG capsule Take 1 capsule (60 mg total) by mouth 2 (two) times daily. 04/19/17   Verner Mould, MD  lubiprostone [facility]) 24 MCG capsule Take 1 capsule (24 mcg total) by mouth 2 (two) times daily with a meal. 11/03/17   Carlis Stable, NP  meloxicam (MOBIC) 7.5 MG tablet Take 2 tablets (15 mg total) by mouth daily. 11/28/17   Guadalupe Dawn, MD  Multiple Vitamins-Minerals (HAIR SKIN AND NAILS FORMULA) TABS Take 2 tablets by mouth daily.    [provider]  potassium chloride (K-DUR) 10 MEQ tablet Take 4 tablets (40 mEq total) by mouth 2 (two) times daily for 4 days. 09/12/17 09/16/17  Annitta Needs, NP  pregabalin (LYRICA) 100 MG capsule Take 1 capsule (100 mg total) by mouth 3 (three) times daily. 09/14/17   Guadalupe Dawn, MD    Review of Systems:    Constitutional:  No weight loss, night sweats,   Head&Eyes: No headache.  No vision loss.  No eye pain or scotoma ENT:  No Difficulty swallowing,Tooth/dental problems,Sore throat,  No ear ache, post nasal drip,  Cardio-vascular:  No chest pain, Orthopnea, PND, swelling in lower extremities, palpitations  GI:  No  abdominal pain, nausea, vomiting, diarrhea, loss of appetite, hematochezia, melena, heartburn, indigestion, Resp:  No coughing up of blood .No wheezing.No chest wall deformity  Skin:  no rash or lesions.  GU:  no dysuria, change in color of urine, no  urgency or frequency. No flank pain.  Musculoskeletal:  No joint pain or swelling. No decreased range of motion. No back pain.  Psych:  No change in mood or affect. No depression or anxiety. Neurologic: No headache, no dysesthesia, no focal weakness, no vision loss. No syncope  Physical Exam: Vitals:   12/10/17 0806 12/10/17 0844 12/10/17 0900 12/10/17 0915  BP: 103/63 118/61 105/62   Pulse: (!) 141 (!) 134  (!) 135  Resp: (!) 31 (!) 22 19 (!) 31  Temp:      TempSrc:      SpO2: 97% 94%  100%  Weight:      Height:       General:  A&O x 3, NAD, nontoxic, pleasant/cooperative Head/Eye: No conjunctival hemorrhage, no icterus, /AT, No nystagmus ENT:  No icterus,  No thrush, good dentition, no pharyngeal exudate Neck:  No masses, no lymphadenpathy, no bruits CV:  RRR, no rub, no gallop, no S3 Lung: Scattered bibasilar rales.  Diminished breath sounds on the right. Abdomen: soft/RUQ,LUQ tender, +BS, nondistended, no peritoneal signs Ext: No cyanosis, No rashes, No petechiae, No lymphangitis, No edema Neuro: CNII-XII intact, strength 4/5 in bilateral upper and lower extremities, no dysmetria  Labs on Admission:  Basic Metabolic Panel: Recent Labs  Lab 12/10/17 0730  NA 133*  K 3.2*  CL 97*  CO2 20*  GLUCOSE 133*  BUN 24*  CREATININE 1.57*  CALCIUM 8.2*   Liver Function Tests: Recent Labs  Lab  12/10/17 0730  AST 25  ALT 17  ALKPHOS 131*  BILITOT 5.8*  PROT 7.9  ALBUMIN 3.4*   Recent Labs  Lab 12/10/17 0730  LIPASE 17   No results for input(s): AMMONIA in the last 168 hours. CBC: Recent Labs  Lab 12/10/17 0730  WBC 5.5  NEUTROABS 4.6  HGB 13.0  HCT 39.1  MCV 90.1  PLT 211   Coagulation Profile: Recent Labs  Lab 12/10/17 0730  INR 1.48   Cardiac Enzymes: Recent Labs  Lab 12/10/17 0730  TROPONINI <0.03   BNP: Invalid input(s): POCBNP CBG: No results for input(s): GLUCAP in the last 168 hours. Urine analysis:    Component Value Date/Time   COLORURINE YELLOW 02/06/2017 0915   APPEARANCEUR CLEAR 02/06/2017 0915   LABSPEC 1.008 02/06/2017 0915   PHURINE 7.0 02/06/2017 0915   GLUCOSEU NEGATIVE 02/06/2017 0915   HGBUR NEGATIVE 02/06/2017 0915   HGBUR trace-intact 03/09/2007 1414   BILIRUBINUR NEGATIVE 02/06/2017 0915   BILIRUBINUR negative 04/14/2016 0919   BILIRUBINUR neg 09/27/2012 0850   KETONESUR NEGATIVE 02/06/2017 0915   PROTEINUR NEGATIVE 02/06/2017 0915   UROBILINOGEN 4.0 (A) 04/14/2016 0919   UROBILINOGEN 0.2 08/05/2013 1025   NITRITE NEGATIVE 02/06/2017 0915   LEUKOCYTESUR NEGATIVE 02/06/2017 0915   Sepsis Labs: '@LABRCNTIP' (procalcitonin:4,lacticidven:4) )No results found for this or any previous visit (from the past 240 hour(s)).   Radiological Exams on Admission: Dg Chest 2 View  Result Date: 12/10/2017 CLINICAL DATA:  Acute shortness of breath. History of LEFT lung cancer. EXAM: CHEST - 2 VIEW COMPARISON:  02/06/2017 chest radiograph and prior studies FINDINGS: A moderate to large area of RIGHT UPPER lobe consolidation noted. The LEFT lung is clear. Surgical clips in the LEFT hilum again noted. No pleural effusion or pneumothorax. Cardiomediastinal silhouette is otherwise unremarkable. No acute bony abnormalities identified. IMPRESSION: RIGHT UPPER lobe consolidation. If there is strong clinical suspicion for pneumonia, short-term  radiographic follow-up following appropriate therapy recommended. Otherwise chest CT with contrast is recommended. Electronically Signed  By: Margarette Canada M.D.   On: 12/10/2017 08:21    EKG: Independently reviewed. Sinus, flat T waves    Time spent:60 minutes Code Status:   FULL Family Communication:  Spouse and daughter at bedside Disposition Plan: expect 2-3 day hospitalization Consults called: none DVT Prophylaxis: [place] Lovenox  Orson Eva, DO  [facility] Pager [phone]  If 7PM-7AM, please contact night-coverage [email or web address] Password [facility] 12/10/2017, 9:54 AM

## 2017-12-10 NOTE — ED Notes (Signed)
CRITICAL VALUE ALERT  Critical Value:  Lactic 5.6  Date & Time Notied:  12/10/17 @ 2671  Provider Notified: Dr Melina Copa  Orders Received/Actions taken: see new orders.

## 2017-12-10 NOTE — ED Triage Notes (Signed)
SOB started last night.  Pt reports falling 4 times last night.  Possibly hitting head but denies pain.

## 2017-12-10 NOTE — ED Provider Notes (Signed)
[facility] EMERGENCY DEPARTMENT Provider Note   CSN: [id number] Arrival date & time: 12/10/17  2774     History   Chief Complaint Chief Complaint  Patient presents with  . Shortness of Breath    HPI Laurissa A Barrantes is a 57 y.o. female.  She said she has not had any appetite for a few days and just been drinking water.  She is feeling generally weak and is been lightheaded when she stands up and is falling to the floor multiple times.  She denies any loss of consciousness.  Feels generally short of breath but there is been no cough.  No fever no chest pain no abdominal pain no vomiting no diarrhea no urinary symptoms.  She is never had this happen before.  No sick contacts.  Taking her medicines regular.  The history is provided by the patient.  Weakness  Primary symptoms include no focal weakness, no visual change, no auditory change. This is a new problem. The current episode started more than 2 days ago. The problem has not changed since onset.There was no focality noted. There has been no fever. Associated symptoms include shortness of breath. Pertinent negatives include no chest pain, no vomiting, no altered mental status, no confusion and no headaches. There were no medications administered prior to arrival.    Past Medical History:  Diagnosis Date  . Anxiety   . Asthma    every now and then.  Wheezing and coughing  . Cancer ([place]) 1997   left lung  . Chronic abdominal pain   . Chronic back pain   . Chronic joint pain   . Depression   . GERD (gastroesophageal reflux disease)   . HLD (hyperlipidemia)   . HTN (hypertension)   . Nausea and vomiting    chronic, recurrent  . Osteoarthrosis involving more than one site but not generalized   . Palpitations 01/2013   chronic, intermittent  . Shortness of breath dyspnea    with exertion    Patient Active Problem List   Diagnosis Date Noted  . Aortic regurgitation 10/25/2017  . Diastolic dysfunction 12/87/8676  . Dysphagia  01/20/2017  . Left hip pain 01/19/2017  . Right hand pain 01/19/2017  . Diarrhea 10/11/2016  . Right hip pain 06/07/2016  . Dysuria 04/14/2016  . Malaise and fatigue 04/14/2016  . Breast pain, left 11/06/2015  . Mass of left forearm 10/10/2015    Class: Chronic  . Dysphagia, oropharyngeal   . Gastroesophageal reflux disease without esophagitis   . Carpal tunnel syndrome, right 07/11/2015    Class: Chronic  . Prediabetes 07/09/2015  . Lumbar stenosis with neurogenic claudication 01/05/2015  . Spondylolisthesis of lumbar region 01/03/2015    Class: Chronic  . Spinal stenosis, lumbar region, with neurogenic claudication 01/03/2015    Class: Chronic  . Soft tissue mass 07/23/2014    Class: Acute  . Coccygodynia 07/23/2014    Class: Acute  . Healthcare maintenance 03/19/2014  . Constipation 02/15/2014  . Dysphagia, pharyngoesophageal phase   . Elevated alkaline phosphatase level 11/06/2013  . Rectal bleeding 11/06/2013  . Abdominal pain 10/03/2013  . Obesity, unspecified 02/15/2013  . Genital herpes 10/12/2012  . HSV (herpes simplex virus) anogenital infection 09/29/2012  . UNSPECIFIED SLEEP APNEA 12/04/2008  . ADENOCARCINOMA, LUNG 10/09/2008  . Osteoarthritis of multiple joints 07/13/2006  . HYPERCHOLESTEROLEMIA 03/17/2006  . DEPRESSION, MAJOR, RECURRENT 03/17/2006  . HYPERTENSION, BENIGN SYSTEMIC 03/17/2006  . GASTROESOPHAGEAL REFLUX, NO ESOPHAGITIS 03/17/2006  . INSOMNIA NOS 03/17/2006  Past Surgical History:  Procedure Laterality Date  . ABDOMINAL HYSTERECTOMY    . ABDOMINAL SURGERY     ovarian cyst removal  . BIOPSY N/A 11/22/2013   Procedure: GASTRIC BIOPSY;  Surgeon: Daneil Dolin, MD;  Location: [facility];  Service: Endoscopy;  Laterality: N/A;  . BLADDER SURGERY  suspension x4   x 4  . CARPAL TUNNEL RELEASE Right 07/11/2015   Procedure: RIGHT CARPAL TUNNEL RELEASE;  Surgeon: Jessy Oto, MD;  Location: [place];  Service: Orthopedics;   Laterality: Right;  . CHOLECYSTECTOMY N/A 04/05/2014   Procedure: LAPAROSCOPIC CHOLECYSTECTOMY;  Surgeon: Aviva Signs Md, MD;  Location: [facility];  Service: General;  Laterality: N/A;  . COCCYGECTOMY N/A 04/11/2015   Procedure: COCCYGECTOMY WITH OSTEOTOMY THROUGH AREA OF DEFORMITY;  Surgeon: Jessy Oto, MD;  Location: [place];  Service: Orthopedics;  Laterality: N/A;  . COLONOSCOPY    . COLONOSCOPY WITH PROPOFOL N/A 11/22/2013   Dr. Gala Romney: Grade 3 and 4/internal hemorrhoids?"likely source of hematochezia Normal colonoscopy otherwise.   Marland Kitchen COLONOSCOPY WITH PROPOFOL N/A 09/15/2017   Procedure: COLONOSCOPY WITH PROPOFOL;  Surgeon: Daneil Dolin, MD;  [facility];  Service: Endoscopy;  Laterality: N/A;  7:30am  . ESOPHAGOGASTRODUODENOSCOPY (EGD) WITH PROPOFOL N/A 11/22/2013   Dr. Gala Romney: Incomplete Schatzki's ring dilated and disrupted as described above.  Small hiatal hernia. Focally abnormal gastric mucosa of uncertain significance status post biopsy, reactive gastropathy, negative H.pylori  . ESOPHAGOGASTRODUODENOSCOPY (EGD) WITH PROPOFOL N/A 08/04/2015   Dr. Gala Romney: mild Schatzki's ring s/p dilation, small hiatal hernia   . ESOPHAGOGASTRODUODENOSCOPY (EGD) WITH PROPOFOL N/A 03/17/2017   Mild Schatki's ring s/p dilation, small hiatal hernia, otherwise normal  . EXCISION VAGINAL CYST Left 06/20/2012   Procedure: EXCISION LEFT LABIAL CYST;  Surgeon: Jonnie Kind, MD;  Location: [facility];  Service: Gynecology;  Laterality: Left;  . LOBECTOMY Left   . LUNG CANCER SURGERY  1997   age 31, resection only  . MALONEY DILATION N/A 11/22/2013   Procedure: [name] DILATION;  Surgeon: Daneil Dolin, MD;  Location: [facility];  Service: Endoscopy;  Laterality: N/A;  56  . MALONEY DILATION N/A 08/04/2015   Procedure: [name] DILATION;  Surgeon: Daneil Dolin, MD;  [facility];  Service: Endoscopy;  Laterality: N/A;  [name] DILATION N/A 03/17/2017   Procedure: [name] DILATION;  Surgeon:  Daneil Dolin, MD;  [facility];  Service: Endoscopy;  Laterality: N/A;  . MASS EXCISION N/A 07/23/2014   Procedure: EXCISIONAL BIOPSY NODULE LEFT PARACOCCYGEAL AREA;  Surgeon: Jessy Oto, MD;  Location: [place];  Service: Orthopedics;  Laterality: N/A;  . MASS EXCISION Left 10/10/2015   Procedure: EXCISIONAL BIOPSY OF LEFT DORSORADIAL FOREARM MASS LIPOMA;  Surgeon: Jessy Oto, MD;  Location: [place];  Service: Orthopedics;  Laterality: Left;  . TUBAL LIGATION       OB History    Gravida  3   Para  3   Term  3   Preterm      AB      Living  3     SAB      TAB      Ectopic      Multiple      Live Births               Home Medications    Prior to Admission medications   Medication Sig Start Date End Date Taking? Authorizing Provider  acyclovir (ZOVIRAX) 400 MG tablet TAKE 1 TABLET BY MOUTH TWICE DAILY Patient taking differently: Take 400 mg by mouth 2 (two) times daily.  05/30/17   Verner Mould, MD  albuterol [facility] HFA) 108 [phone] Base) MCG/ACT inhaler Inhale 2 puffs into the lungs every 6 (six) hours as needed for wheezing or shortness of breath. 11/02/16   Verner Mould, MD  amitriptyline (ELAVIL) 150 MG tablet TAKE 1 TABLET BY MOUTH ONCE DAILY AT NIGHT 11/14/17   Guadalupe Dawn, MD  amLODipine (NORVASC) 10 MG tablet Take 1 tablet (10 mg total) by mouth daily. 10/13/17   Martyn Malay, MD  atorvastatin (LIPITOR) 40 MG tablet Take 1 tablet (40 mg total) by mouth daily. 02/16/17   Verner Mould, MD  baclofen (LIORESAL) 10 MG tablet Take 1 tablet (10 mg total) by mouth as needed. Patient taking differently: Take 10 mg by mouth daily as needed for muscle spasms.  08/18/17   Guadalupe Dawn, MD  dexlansoprazole (DEXILANT) 60 MG capsule Take 1 capsule (60 mg total) by mouth daily before breakfast. 09/14/17   Mahala Menghini, PA-C  diclofenac sodium (VOLTAREN) 1 % GEL Apply 4 g topically 4 (four) times  daily. Patient taking differently: Apply 4 g topically 4 (four) times daily as needed (pain).  08/18/17   Guadalupe Dawn, MD  DULoxetine (CYMBALTA) 60 MG capsule Take 1 capsule (60 mg total) by mouth 2 (two) times daily. 04/19/17   Verner Mould, MD  lubiprostone [facility]) 24 MCG capsule Take 1 capsule (24 mcg total) by mouth 2 (two) times daily with a meal. 11/03/17   Carlis Stable, NP  meloxicam (MOBIC) 7.5 MG tablet Take 2 tablets (15 mg total) by mouth daily. 11/28/17   Guadalupe Dawn, MD  Multiple Vitamins-Minerals (HAIR SKIN AND NAILS FORMULA) TABS Take 2 tablets by mouth daily.    [provider]  potassium chloride (K-DUR) 10 MEQ tablet Take 4 tablets (40 mEq total) by mouth 2 (two) times daily for 4 days. 09/12/17 09/16/17  Annitta Needs, NP  pregabalin (LYRICA) 100 MG capsule Take 1 capsule (100 mg total) by mouth 3 (three) times daily. 09/14/17   Guadalupe Dawn, MD    Family History Family History  Problem Relation Age of Onset  . Diabetes Other   . Heart disease Other        has Psychologist, forensic  . Hypertension Mother   . Kidney disease Mother   . Hypertension Father   . Kidney disease Father   . Heart disease Father   . Cancer Father        leukemia  . Hypertension Sister   . Hypertension Sister   . Colon cancer Neg Hx     Social History Social History   Tobacco Use  . Smoking status: Former Smoker    Packs/day: 1.50    Years: 25.00    Pack years: 37.50    Types: Cigarettes    Last attempt to quit: 04/07/1995    Years since quitting: 22.6  . Smokeless tobacco: Never Used  Substance Use Topics  . Alcohol use: No  . Drug use: No     Allergies   Promethazine hcl   Review of Systems Review of Systems  Constitutional: Positive for appetite change and fatigue. Negative for fever.  HENT: Negative for sore throat.   Eyes: Negative for visual disturbance.  Respiratory: Positive for shortness of breath.   Cardiovascular: Negative for chest pain.    Gastrointestinal: Negative  for abdominal pain and vomiting.  Genitourinary: Negative for dysuria.  Musculoskeletal: Negative for neck pain.  Skin: Negative for rash.  Neurological: Positive for weakness and light-headedness. Negative for focal weakness, syncope and headaches.  Psychiatric/Behavioral: Negative for confusion.     Physical Exam Updated Vital Signs Pulse (!) 140   Temp 98.7 F (37.1 C) (Oral)   Resp (!) 28   Ht 5\' 4"  (1.626 m)   Wt 77.1 kg   SpO2 94%   BMI 29.18 kg/m   Physical Exam  Constitutional: She appears well-developed and well-nourished. No distress.  HENT:  Head: Normocephalic and atraumatic.  Eyes: Conjunctivae are normal.  Neck: Neck supple.  Cardiovascular: Regular rhythm. Tachycardia present.  No murmur heard. Pulmonary/Chest: Effort normal and breath sounds normal. No respiratory distress.  Abdominal: Soft. There is no tenderness.  Musculoskeletal: She exhibits no edema, tenderness or deformity.  Neurological: She is alert. She has normal strength. No sensory deficit. GCS eye subscore is 4. GCS verbal subscore is 5. GCS motor subscore is 6.  Skin: Skin is warm and dry.  Psychiatric: She has a normal mood and affect.  Nursing note and vitals reviewed.    ED Treatments / Results  Labs (all labs ordered are listed, but only abnormal results are displayed) Labs Reviewed  COMPREHENSIVE METABOLIC PANEL - Abnormal; Notable for the following components:      Result Value   Sodium 133 (*)    Potassium 3.2 (*)    Chloride 97 (*)    CO2 20 (*)    Glucose, Bld 133 (*)    BUN 24 (*)    Creatinine, Ser 1.57 (*)    Calcium 8.2 (*)    Albumin 3.4 (*)    Alkaline Phosphatase 131 (*)    Total Bilirubin 5.8 (*)    GFR calc non Af Amer 36 (*)    GFR calc Af Amer 42 (*)    Anion gap 16 (*)    All other components within normal limits  LACTIC ACID, PLASMA - Abnormal; Notable for the following components:   Lactic Acid, Venous 5.6 (*)    All other  components within normal limits  LACTIC ACID, PLASMA - Abnormal; Notable for the following components:   Lactic Acid, Venous 4.0 (*)    All other components within normal limits  CBC WITH DIFFERENTIAL/PLATELET - Abnormal; Notable for the following components:   Lymphs Abs 0.5 (*)    All other components within normal limits  URINALYSIS, ROUTINE W REFLEX MICROSCOPIC - Abnormal; Notable for the following components:   Color, Urine AMBER (*)    APPearance CLOUDY (*)    Hgb urine dipstick MODERATE (*)    Bilirubin Urine SMALL (*)    Protein, ur 30 (*)    Bacteria, UA RARE (*)    All other components within normal limits  PROTIME-INR - Abnormal; Notable for the following components:   Prothrombin Time 17.7 (*)    All other components within normal limits  LACTIC ACID, PLASMA - Abnormal; Notable for the following components:   Lactic Acid, Venous 3.3 (*)    All other components within normal limits  APTT - Abnormal; Notable for the following components:   aPTT 46 (*)    All other components within normal limits  BILIRUBIN, FRACTIONATED(TOT/DIR/INDIR) - Abnormal; Notable for the following components:   Total Bilirubin 4.7 (*)    Bilirubin, Direct 2.8 (*)    Indirect Bilirubin 1.9 (*)    All other components within normal  limits  MAGNESIUM - Abnormal; Notable for the following components:   Magnesium 1.2 (*)    All other components within normal limits  CULTURE, BLOOD (ROUTINE X 2)  CULTURE, BLOOD (ROUTINE X 2)  MRSA PCR SCREENING  RESPIRATORY PANEL BY PCR  URINE CULTURE  LIPASE, BLOOD  TROPONIN I  INFLUENZA PANEL BY PCR (TYPE A & B)  PROCALCITONIN  CK  HIV ANTIBODY (ROUTINE TESTING W REFLEX)  LACTIC ACID, PLASMA  HEMOGLOBIN A1C  URINALYSIS, COMPLETE (UACMP) WITH MICROSCOPIC  LEGIONELLA PNEUMOPHILA SEROGP 1 UR AG  STREP PNEUMONIAE URINARY ANTIGEN    EKG EKG Interpretation  Date/Time:  Saturday December 10 2017 07:23:22 EST Ventricular Rate:  143 PR Interval:    QRS  Duration: 78 QT Interval:  276 QTC Calculation: 426 R Axis:   -48 Text Interpretation:  Sinus tachycardia LAD, consider left anterior fascicular block Borderline T wave abnormalities increased rate from prior 9/19 Confirmed by Aletta Edouard [phone]) on 12/10/2017 7:51:12 AM Also confirmed by Aletta Edouard [phone]), editor Lynder Parents [phone])  on 12/10/2017 9:55:40 AM   Radiology Dg Chest 2 View  Result Date: 12/10/2017 CLINICAL DATA:  Acute shortness of breath. History of LEFT lung cancer. EXAM: CHEST - 2 VIEW COMPARISON:  02/06/2017 chest radiograph and prior studies FINDINGS: A moderate to large area of RIGHT UPPER lobe consolidation noted. The LEFT lung is clear. Surgical clips in the LEFT hilum again noted. No pleural effusion or pneumothorax. Cardiomediastinal silhouette is otherwise unremarkable. No acute bony abnormalities identified. IMPRESSION: RIGHT UPPER lobe consolidation. If there is strong clinical suspicion for pneumonia, short-term radiographic follow-up following appropriate therapy recommended. Otherwise chest CT with contrast is recommended. Electronically Signed   By: Margarette Canada M.D.   On: 12/10/2017 08:21    Procedures .Critical Care Performed by: Hayden Rasmussen, MD Authorized by: Hayden Rasmussen, MD   Critical care provider statement:    Critical care time (minutes):  45   Critical care time was exclusive of:  Separately billable procedures and treating other patients   Critical care was necessary to treat or prevent imminent or life-threatening deterioration of the following conditions:  Sepsis   Critical care was time spent personally by me on the following activities:  Discussions with consultants, evaluation of patient's response to treatment, examination of patient, ordering and performing treatments and interventions, ordering and review of laboratory studies, ordering and review of radiographic studies, pulse oximetry, re-evaluation of patient's  condition, obtaining history from patient or surrogate, review of old charts and development of treatment plan with patient or surrogate   I assumed direction of critical care for this patient from another provider in my specialty: no     (including critical care time)  Medications Ordered in ED Medications  atorvastatin (LIPITOR) tablet 40 mg (40 mg Oral Given 12/10/17 1328)  DULoxetine (CYMBALTA) DR capsule 60 mg (60 mg Oral Not Given 12/10/17 1330)  pantoprazole (PROTONIX) EC tablet 40 mg (has no administration in time range)  lubiprostone (AMITIZA) capsule 24 mcg (has no administration in time range)  baclofen (LIORESAL) tablet 10 mg (has no administration in time range)  pregabalin (LYRICA) capsule 100 mg (has no administration in time range)  enoxaparin (LOVENOX) injection 40 mg (has no administration in time range)  acetaminophen (TYLENOL) tablet 650 mg (has no administration in time range)    Or  acetaminophen (TYLENOL) suppository 650 mg (has no administration in time range)  ondansetron (ZOFRAN) tablet 4 mg (has no administration in time range)  Or  ondansetron (ZOFRAN) injection 4 mg (has no administration in time range)  cefTRIAXone (ROCEPHIN) 2 g in sodium chloride 0.9 % 100 mL IVPB (has no administration in time range)  azithromycin (ZITHROMAX) 500 mg in sodium chloride 0.9 % 250 mL IVPB (has no administration in time range)  0.9 % NaCl with KCl 20 mEq/ L  infusion ( Intravenous New Bag/Given 12/10/17 1352)  ipratropium-albuterol (DUONEB) 0.5-2.5 (3) MG/3ML nebulizer solution 3 mL (3 mLs Nebulization Given 12/10/17 1500)  sodium chloride 0.9 % bolus 1,000 mL (0 mLs Intravenous Stopped 12/10/17 0919)  cefTRIAXone (ROCEPHIN) 1 g in sodium chloride 0.9 % 100 mL IVPB (0 g Intravenous Stopped 12/10/17 0930)  azithromycin (ZITHROMAX) 500 mg in sodium chloride 0.9 % 250 mL IVPB (0 mg Intravenous Stopped 12/10/17 1030)  sodium chloride 0.9 % bolus 1,000 mL (0 mLs Intravenous  Stopped 12/10/17 1029)    And  sodium chloride 0.9 % bolus 1,000 mL (1,000 mLs Intravenous New Bag/Given 12/10/17 1030)    And  sodium chloride 0.9 % bolus 500 mL (500 mLs Intravenous New Bag/Given 12/10/17 1135)  cefTRIAXone (ROCEPHIN) 1 g in sodium chloride 0.9 % 100 mL IVPB (1 g Intravenous New Bag/Given 12/10/17 1343)  sodium chloride 0.9 % bolus 1,000 mL (1,000 mLs Intravenous New Bag/Given 12/10/17 1314)  iopamidol (ISOVUE-300) 61 % injection (  Contrast Given 12/10/17 1331)     Initial Impression / Assessment and Plan / ED Course  I have reviewed the triage vital signs and the nursing notes.  Pertinent labs & imaging results that were available during my care of the patient were reviewed by me and considered in my medical decision making (see chart for details).  Clinical Course as of Dec 11 1502  Sat Dec 10, 2017  0830 Patient's x-ray shows a large consolidation on the right.  We are drawing blood cultures and have ordered IV antibiotics.  Feel the patient will need admission for further management of this.  Patient was updated on current results.   [MB]    Clinical Course User Index [MB] Hayden Rasmussen, MD    Final Clinical Impressions(s) / ED Diagnoses   Final diagnoses:  Pneumonia of right upper lobe due to infectious organism ([place])  Sepsis, due to unspecified organism, unspecified whether acute organ dysfunction present [facility])  AKI (acute kidney injury) ([place])  Elevated bilirubin    ED Discharge Orders    None       Hayden Rasmussen, MD 12/10/17 1505

## 2017-12-10 NOTE — Progress Notes (Signed)
Patient now states she feels sweaty and having a hard time breathing. Increased oxygen to 3 lpm. Placed in a more up right positioning in bed. Lungs sound course crackles. Has finished all oral contrast for CT scan.

## 2017-12-10 NOTE — ED Notes (Signed)
Lab in to draw lactic acid.  ICU informed.

## 2017-12-10 NOTE — Progress Notes (Signed)
Responded to nursing call:  Increasing sob, distress   Subjective: Pt c/o worsen sob since this am.  C/o chest pain with cough.  No n/v.  No hemoptysis  Vitals:   12/10/17 1123 12/10/17 1200 12/10/17 1300 12/10/17 1500  BP:  130/60    Pulse:  (!) 128 (!) 134 (!) 137  Resp:    20  Temp: (!) 100.9 F (38.3 C)     TempSrc: Oral     SpO2:  95% 96% 96%  Weight: 81.3 kg     Height: 5\' 4"  (1.626 m)      CV--RRR Lung--bilateral rhonchi, basilar wheeze Abd--soft+BS/NT   Assessment/Plan: 1--respiratory distress/acute respiratory failure with hypoxia -likely fluid overload -stat CXR -lasix 40 mg IV x 1 -solumedrol 125 mg IV x 1 -flu--neg -ABG--7.48/25/82 on 3L -pt received ceftriaxone and azithro in ED -MRSA neg -PCT pending     Orson Eva, DO [facility]

## 2017-12-10 NOTE — Progress Notes (Signed)
[name] (mid-level) notified of current vs 99.7 oral, HR 126, B/P 100/51 map 67, R 26. Sp02 94 on 2.5 liters oxygen.  All readings at rest.

## 2017-12-11 ENCOUNTER — Inpatient Hospital Stay (HOSPITAL_COMMUNITY): Payer: Medicare Other

## 2017-12-11 DIAGNOSIS — K56600 Partial intestinal obstruction, unspecified as to cause: Secondary | ICD-10-CM

## 2017-12-11 DIAGNOSIS — J441 Chronic obstructive pulmonary disease with (acute) exacerbation: Secondary | ICD-10-CM

## 2017-12-11 LAB — BASIC METABOLIC PANEL
ANION GAP: 12 (ref 5–15)
BUN: 15 mg/dL (ref 6–20)
CO2: 22 mmol/L (ref 22–32)
Calcium: 7.3 mg/dL — ABNORMAL LOW (ref 8.9–10.3)
Chloride: 103 mmol/L (ref 98–111)
Creatinine, Ser: 0.95 mg/dL (ref 0.44–1.00)
GFR calc Af Amer: >60 mL/min (ref >60)
GLUCOSE: 173 mg/dL — AB (ref 70–99)
POTASSIUM: 3 mmol/L — AB (ref 3.5–5.1)
Sodium: 137 mmol/L (ref 135–145)

## 2017-12-11 LAB — RESPIRATORY PANEL BY PCR
ADENOVIRUS-RVPPCR: NOT DETECTED
Bordetella pertussis: NOT DETECTED
CORONAVIRUS 229E-RVPPCR: NOT DETECTED
CORONAVIRUS HKU1-RVPPCR: NOT DETECTED
CORONAVIRUS NL63-RVPPCR: NOT DETECTED
CORONAVIRUS OC43-RVPPCR: NOT DETECTED
Chlamydophila pneumoniae: NOT DETECTED
Influenza A: NOT DETECTED
Influenza B: NOT DETECTED
METAPNEUMOVIRUS-RVPPCR: NOT DETECTED
Mycoplasma pneumoniae: NOT DETECTED
PARAINFLUENZA VIRUS 1-RVPPCR: NOT DETECTED
PARAINFLUENZA VIRUS 2-RVPPCR: NOT DETECTED
Parainfluenza Virus 3: NOT DETECTED
Parainfluenza Virus 4: NOT DETECTED
Respiratory Syncytial Virus: NOT DETECTED
Rhinovirus / Enterovirus: NOT DETECTED

## 2017-12-11 LAB — CBC
HCT: 31 % — ABNORMAL LOW (ref 36.0–46.0)
HEMOGLOBIN: 10.3 g/dL — AB (ref 12.0–15.0)
MCH: 30.4 pg (ref 26.0–34.0)
MCHC: 33.2 g/dL (ref 30.0–36.0)
MCV: 91.4 fL (ref 80.0–100.0)
Platelets: 204 10*3/uL (ref 150–400)
RBC: 3.39 MIL/uL — AB (ref 3.87–5.11)
RDW: 12.3 % (ref 11.5–15.5)
WBC: 6.1 10*3/uL (ref 4.0–10.5)
nRBC: 0 % (ref 0.0–0.2)

## 2017-12-11 LAB — HEPATIC FUNCTION PANEL
ALT: 14 U/L (ref 0–44)
AST: 17 U/L (ref 15–41)
Albumin: 2.8 g/dL — ABNORMAL LOW (ref 3.5–5.0)
Alkaline Phosphatase: 87 U/L (ref 38–126)
Bilirubin, Direct: 1.7 mg/dL — ABNORMAL HIGH (ref 0.0–0.2)
Indirect Bilirubin: 1.4 mg/dL — ABNORMAL HIGH (ref 0.3–0.9)
TOTAL PROTEIN: 6.8 g/dL (ref 6.5–8.1)
Total Bilirubin: 3.1 mg/dL — ABNORMAL HIGH (ref 0.3–1.2)

## 2017-12-11 LAB — PROCALCITONIN: PROCALCITONIN: 0.27 ng/mL

## 2017-12-11 LAB — MAGNESIUM: Magnesium: 1.8 mg/dL (ref 1.7–2.4)

## 2017-12-11 LAB — PHOSPHORUS: Phosphorus: 1.9 mg/dL — ABNORMAL LOW (ref 2.5–4.6)

## 2017-12-11 LAB — HIV ANTIBODY (ROUTINE TESTING W REFLEX): HIV SCREEN 4TH GENERATION: NONREACTIVE

## 2017-12-11 MED ORDER — METOPROLOL TARTRATE 5 MG/5ML IV SOLN
5.0000 mg | Freq: Once | INTRAVENOUS | Status: AC
  Administered 2017-12-11: 5 mg via INTRAVENOUS
  Filled 2017-12-11: qty 5

## 2017-12-11 MED ORDER — MAGNESIUM SULFATE 2 GM/50ML IV SOLN
2.0000 g | Freq: Once | INTRAVENOUS | Status: AC
  Administered 2017-12-11: 2 g via INTRAVENOUS
  Filled 2017-12-11: qty 50

## 2017-12-11 MED ORDER — ALBUTEROL SULFATE (2.5 MG/3ML) 0.083% IN NEBU
INHALATION_SOLUTION | RESPIRATORY_TRACT | Status: AC
  Administered 2017-12-11: 2.5 mg
  Filled 2017-12-11: qty 3

## 2017-12-11 MED ORDER — ALBUTEROL SULFATE (2.5 MG/3ML) 0.083% IN NEBU: 2.5000 mg | INHALATION_SOLUTION | RESPIRATORY_TRACT | Status: DC | PRN

## 2017-12-11 MED ORDER — SODIUM CHLORIDE 0.9 % IV SOLN: INTRAVENOUS | Status: DC | PRN

## 2017-12-11 MED ORDER — IPRATROPIUM-ALBUTEROL 0.5-2.5 (3) MG/3ML IN SOLN
3.0000 mL | Freq: Four times a day (QID) | RESPIRATORY_TRACT | Status: DC
  Administered 2017-12-11 – 2017-12-12 (×7): 3 mL via RESPIRATORY_TRACT
  Filled 2017-12-11 (×7): qty 3

## 2017-12-11 MED ORDER — METHYLPREDNISOLONE SODIUM SUCC 125 MG IJ SOLR
60.0000 mg | Freq: Every day | INTRAMUSCULAR | Status: DC
  Administered 2017-12-11 – 2017-12-13 (×3): 60 mg via INTRAVENOUS
  Filled 2017-12-11 (×3): qty 2

## 2017-12-11 MED ORDER — POTASSIUM PHOSPHATES 15 MMOLE/5ML IV SOLN
40.0000 meq | Freq: Once | INTRAVENOUS | Status: AC
  Administered 2017-12-11: 40 meq via INTRAVENOUS
  Filled 2017-12-11: qty 9.09

## 2017-12-11 MED ORDER — POTASSIUM CHLORIDE IN NACL 20-0.9 MEQ/L-% IV SOLN
INTRAVENOUS | Status: DC
  Administered 2017-12-11 – 2017-12-14 (×4): via INTRAVENOUS

## 2017-12-11 NOTE — Consult Note (Signed)
Reason for Consult: Small bowel obstruction Referring Physician: Dr. Clint Bolder is an 57 y.o. female.  HPI: Patient is a 57 year old black female with multiple medical problems who was admitted to the ICU for sepsis secondary to right lower lobe pneumonia.  CT scan of the abdomen was performed due to nausea and the patient was found to have dilated small bowel.  There is contrast distal to the area of dilatation and no transition zone is seen.  While in the intensive care unit, she has passed some flatus.  No nausea or vomiting is noted.  Mild abdominal discomfort is noted.  It is 2 out of 10.  Prior to admission, she was having episodes of diarrhea.  She has had multiple abdominal surgeries in the past.  She currently is hungry.  Past Medical History:  Diagnosis Date  . Anxiety   . Asthma    every now and then.  Wheezing and coughing  . Cancer ([place]) 1997   left lung  . Chronic abdominal pain   . Chronic back pain   . Chronic joint pain   . Depression   . GERD (gastroesophageal reflux disease)   . HLD (hyperlipidemia)   . HTN (hypertension)   . Nausea and vomiting    chronic, recurrent  . Osteoarthrosis involving more than one site but not generalized   . Palpitations 01/2013   chronic, intermittent  . Shortness of breath dyspnea    with exertion    Past Surgical History:  Procedure Laterality Date  . ABDOMINAL HYSTERECTOMY    . ABDOMINAL SURGERY     ovarian cyst removal  . BIOPSY N/A 11/22/2013   Procedure: GASTRIC BIOPSY;  Surgeon: Daneil Dolin, MD;  [facility]: [facility];  Service: Endoscopy;  Laterality: N/A;  . BLADDER SURGERY  suspension x4   x 4  . CARPAL TUNNEL RELEASE Right 07/11/2015   Procedure: RIGHT CARPAL TUNNEL RELEASE;  Surgeon: Jessy Oto, MD;  Location: [facility];  Service: Orthopedics;  Laterality: Right;  . CHOLECYSTECTOMY N/A 04/05/2014   Procedure: LAPAROSCOPIC CHOLECYSTECTOMY;  Surgeon: Aviva Signs Md, MD;  Location: [facility];   Service: General;  Laterality: N/A;  . COCCYGECTOMY N/A 04/11/2015   Procedure: COCCYGECTOMY WITH OSTEOTOMY THROUGH AREA OF DEFORMITY;  Surgeon: Jessy Oto, MD;  Location: [facility];  Service: Orthopedics;  Laterality: N/A;  . COLONOSCOPY    . COLONOSCOPY WITH PROPOFOL N/A 11/22/2013   Dr. Gala Romney: Grade 3 and 4/internal hemorrhoids?"likely source of hematochezia Normal colonoscopy otherwise.   Marland Kitchen COLONOSCOPY WITH PROPOFOL N/A 09/15/2017   Procedure: COLONOSCOPY WITH PROPOFOL;  Surgeon: Daneil Dolin, MD;  [facility];  Service: Endoscopy;  Laterality: N/A;  7:30am  . ESOPHAGOGASTRODUODENOSCOPY (EGD) WITH PROPOFOL N/A 11/22/2013   Dr. Gala Romney: Incomplete Schatzki's ring dilated and disrupted as described above.  Small hiatal hernia. Focally abnormal gastric mucosa of uncertain significance status post biopsy, reactive gastropathy, negative H.pylori  . ESOPHAGOGASTRODUODENOSCOPY (EGD) WITH PROPOFOL N/A 08/04/2015   Dr. Gala Romney: mild Schatzki's ring s/p dilation, small hiatal hernia   . ESOPHAGOGASTRODUODENOSCOPY (EGD) WITH PROPOFOL N/A 03/17/2017   Mild Schatki's ring s/p dilation, small hiatal hernia, otherwise normal  . EXCISION VAGINAL CYST Left 06/20/2012   Procedure: EXCISION LEFT LABIAL CYST;  Surgeon: Jonnie Kind, MD;  Location: [facility];  Service: Gynecology;  Laterality: Left;  . LOBECTOMY Left   . LUNG CANCER SURGERY  1997   age 63, resection only  . MALONEY DILATION N/A  11/22/2013   Procedure: [name] DILATION;  Surgeon: Daneil Dolin, MD;  [facility];  Service: Endoscopy;  Laterality: N/A;  56  . MALONEY DILATION N/A 08/04/2015   Procedure: [name] DILATION;  Surgeon: Daneil Dolin, MD;  [facility];  Service: Endoscopy;  Laterality: N/A;  [name] DILATION N/A 03/17/2017   Procedure: [name] DILATION;  Surgeon: Daneil Dolin, MD;  [facility];  Service: Endoscopy;  Laterality: N/A;  . MASS EXCISION N/A 07/23/2014   Procedure: EXCISIONAL BIOPSY  NODULE LEFT PARACOCCYGEAL AREA;  Surgeon: Jessy Oto, MD;  Location: [facility];  Service: Orthopedics;  Laterality: N/A;  . MASS EXCISION Left 10/10/2015   Procedure: EXCISIONAL BIOPSY OF LEFT DORSORADIAL FOREARM MASS LIPOMA;  Surgeon: Jessy Oto, MD;  Location: [facility];  Service: Orthopedics;  Laterality: Left;  . TUBAL LIGATION      Family History  Problem Relation Age of Onset  . Diabetes Other   . Heart disease Other        has Psychologist, forensic  . Hypertension Mother   . Kidney disease Mother   . Hypertension Father   . Kidney disease Father   . Heart disease Father   . Cancer Father        leukemia  . Hypertension Sister   . Hypertension Sister   . Colon cancer Neg Hx     Social History:  reports that she quit smoking about 22 years ago. Her smoking use included cigarettes. She has a 37.50 pack-year smoking history. She has never used smokeless tobacco. She reports that she does not drink alcohol or use drugs.  Allergies:  Allergies  Allergen Reactions  . Promethazine Hcl Nausea And Vomiting and Other (See Comments)    "Makes my head hurt really bad too"    Medications: I have reviewed the patient's current medications.  Results for orders placed or performed during the hospital encounter of 12/10/17 (from the past 48 hour(s))  Comprehensive metabolic panel     Status: Abnormal   Collection Time: 12/10/17  7:30 AM  Result Value Ref Range   Sodium 133 (L) 135 - 145 mmol/L   Potassium 3.2 (L) 3.5 - 5.1 mmol/L   Chloride 97 (L) 98 - 111 mmol/L   CO2 20 (L) 22 - 32 mmol/L   Glucose, Bld 133 (H) 70 - 99 mg/dL   BUN 24 (H) 6 - 20 mg/dL   Creatinine, Ser 1.57 (H) 0.44 - 1.00 mg/dL   Calcium 8.2 (L) 8.9 - 10.3 mg/dL   Total Protein 7.9 6.5 - 8.1 g/dL   Albumin 3.4 (L) 3.5 - 5.0 g/dL   AST 25 15 - 41 U/L   ALT 17 0 - 44 U/L   Alkaline Phosphatase 131 (H) 38 - 126 U/L   Total Bilirubin 5.8 (H) 0.3 - 1.2 mg/dL   GFR calc non Af Amer 36 (L) >60 mL/min   GFR  calc Af Amer 42 (L) >60 mL/min    Comment: (NOTE) The eGFR has been calculated using the CKD EPI equation. This calculation has not been validated in all clinical situations. eGFR's persistently <60 mL/min signify possible Chronic Kidney Disease.    Anion gap 16 (H) 5 - 15    Comment: Performed at [facility], [address] [place], [place] [address]  Lipase, blood     Status: None   Collection Time: 12/10/17  7:30 AM  Result Value Ref Range   Lipase 17  11 - 51 U/L    Comment: Performed at [facility], [address] [place], [place] [address]  Troponin I - Once     Status: None   Collection Time: 12/10/17  7:30 AM  Result Value Ref Range   Troponin I <0.03 <0.03 ng/mL    Comment: Performed at [facility], [address], [place], [place] [address]  CBC with Differential     Status: Abnormal   Collection Time: 12/10/17  7:30 AM  Result Value Ref Range   WBC 5.5 4.0 - 10.5 K/uL   RBC 4.34 3.87 - 5.11 MIL/uL   Hemoglobin 13.0 12.0 - 15.0 g/dL   HCT 39.1 36.0 - 46.0 %   MCV 90.1 80.0 - 100.0 fL   MCH 30.0 26.0 - 34.0 pg   MCHC 33.2 30.0 - 36.0 g/dL   RDW 12.2 11.5 - 15.5 %   Platelets 211 150 - 400 K/uL   nRBC 0.0 0.0 - 0.2 %   Neutrophils Relative % 83 %   Neutro Abs 4.6 1.7 - 7.7 K/uL   Lymphocytes Relative 10 %   Lymphs Abs 0.5 (L) 0.7 - 4.0 K/uL   Monocytes Relative 4 %   Monocytes Absolute 0.2 0.1 - 1.0 K/uL   Eosinophils Relative 2 %   Eosinophils Absolute 0.1 0.0 - 0.5 K/uL   Basophils Relative 0 %   Basophils Absolute 0.0 0.0 - 0.1 K/uL   WBC Morphology DOHLE BODIES     Comment: MILD LEFT SHIFT (1-5% METAS, OCC MYELO, OCC BANDS) VACUOLATED NEUTROPHILS    Immature Granulocytes 1 %   Abs Immature Granulocytes 0.03 0.00 - 0.07 K/uL   Dohle Bodies PRESENT     Comment: Performed at [facility], [address] [place], [place] [address]  Protime-INR     Status: Abnormal   Collection Time: 12/10/17  7:30 AM  Result Value Ref Range   Prothrombin Time 17.7 (H)  11.4 - 15.2 seconds   INR 1.48     Comment: Performed at [facility], [address] [place], [place] [address]  HIV antibody (Routine Testing)     Status: None   Collection Time: 12/10/17  7:49 AM  Result Value Ref Range   HIV Screen 4th Generation wRfx Non Reactive Non Reactive    Comment: (NOTE) Performed At: [facility] [place], [place] [id number] Rush Farmer MD BE:[id number]   Procalcitonin - Baseline     Status: None   Collection Time: 12/10/17  7:49 AM  Result Value Ref Range   Procalcitonin 47.36 ng/mL    Comment:        Interpretation: PCT >= 10 ng/mL: Important systemic inflammatory response, almost exclusively due to severe bacterial sepsis or septic shock. (NOTE)       Sepsis PCT Algorithm           Lower Respiratory Tract                                      Infection PCT Algorithm    ----------------------------     ----------------------------         PCT < 0.25 ng/mL                PCT < 0.10 ng/mL         Strongly encourage             Strongly discourage  discontinuation of antibiotics    initiation of antibiotics    ----------------------------     -----------------------------       PCT 0.25 - 0.50 ng/mL            PCT 0.10 - 0.25 ng/mL               OR       >80% decrease in PCT            Discourage initiation of                                            antibiotics      Encourage discontinuation           of antibiotics    ----------------------------     -----------------------------         PCT >= 0.50 ng/mL              PCT 0.26 - 0.50 ng/mL                AND       <80% decrease in PCT             Encourage initiation of                                             antibiotics       Encourage continuation           of antibiotics    ----------------------------     -----------------------------        PCT >= 0.50 ng/mL                  PCT > 0.50 ng/mL               AND         increase in PCT                   Strongly encourage                                      initiation of antibiotics    Strongly encourage escalation           of antibiotics                                     -----------------------------                                           PCT <= 0.25 ng/mL                                                 OR                                        >  80% decrease in PCT                                     Discontinue / Do not initiate                                             antibiotics Performed at [facility], [address] [place], [place] [address]   APTT     Status: Abnormal   Collection Time: 12/10/17  7:49 AM  Result Value Ref Range   aPTT 46 (H) 24 - 36 seconds    Comment:        IF BASELINE aPTT IS ELEVATED, SUGGEST PATIENT RISK ASSESSMENT BE USED TO DETERMINE APPROPRIATE ANTICOAGULANT THERAPY. Performed at [facility], [address] [place], [place] [address]   CK     Status: None   Collection Time: 12/10/17  7:49 AM  Result Value Ref Range   Total CK 112 38 - 234 U/L    Comment: Performed at [facility], [address] [place], [place] [address]  Bilirubin, fractionated(tot/dir/indir)     Status: Abnormal   Collection Time: 12/10/17  7:49 AM  Result Value Ref Range   Total Bilirubin 4.7 (H) 0.3 - 1.2 mg/dL   Bilirubin, Direct 2.8 (H) 0.0 - 0.2 mg/dL   Indirect Bilirubin 1.9 (H) 0.3 - 0.9 mg/dL    Comment: Performed at [facility], [address] [place], [place] [address]  Hemoglobin A1c     Status: None   Collection Time: 12/10/17  7:49 AM  Result Value Ref Range   Hgb A1c MFr Bld 5.5 4.8 - 5.6 %    Comment: (NOTE) Pre diabetes:          5.7%-6.4% Diabetes:              >6.4% Glycemic control for   <7.0% adults with diabetes    Mean Plasma Glucose 111.15 mg/dL    Comment: Performed at [facility] [address], [place], [place] [address]  Magnesium     Status: Abnormal   Collection Time: 12/10/17  7:49 AM  Result Value Ref Range    Magnesium 1.2 (L) 1.7 - 2.4 mg/dL    Comment: Performed at [facility], [address] [place],  [address]  Lactic acid, plasma     Status: Abnormal   Collection Time: 12/10/17  8:11 AM  Result Value Ref Range   Lactic Acid, Venous 5.6 (HH) 0.5 - 1.9 mmol/L    Comment: CRITICAL RESULT CALLED TO, READ BACK BY AND VERIFIED WITH: MARTIN D. @ 7619 ON [id number] BY HENDERSON L. Performed at [facility], [address], [place],  [address]   Influenza panel by PCR (type A & B)     Status: None   Collection Time: 12/10/17  8:18 AM  Result Value Ref Range   Influenza A By PCR NEGATIVE NEGATIVE   Influenza B By PCR NEGATIVE NEGATIVE    Comment: (NOTE) The Xpert Xpress Flu assay is intended as an aid in the diagnosis of  influenza and should not be used as a sole basis for treatment.  This  assay is FDA approved for nasopharyngeal swab specimens only. Nasal  washings and aspirates are unacceptable for Xpert Xpress Flu testing. Performed at [facility], [address], [place], [place] [address]   Culture, blood (routine x 2)     Status: None (Preliminary result)   Collection Time: 12/10/17  8:33 AM  Result Value Ref Range   Specimen Description LEFT ANTECUBITAL    Special Requests      BOTTLES DRAWN AEROBIC AND ANAEROBIC Blood Culture adequate volume   Culture      NO GROWTH < 24 HOURS Performed at [facility], [address] [place], [place] [address]    Report Status PENDING   Culture, blood (routine x 2)     Status: None (Preliminary result)   Collection Time: 12/10/17  8:40 AM  Result Value Ref Range   Specimen Description BLOOD LEFT HAND    Special Requests      BOTTLES DRAWN AEROBIC AND ANAEROBIC Blood Culture adequate volume   Culture      NO GROWTH < 24 HOURS Performed at [facility], [address] [place], [place] [address]    Report Status PENDING   Urinalysis, Routine w reflex microscopic     Status: Abnormal   Collection Time: 12/10/17  9:45 AM  Result  Value Ref Range   Color, Urine AMBER (A) YELLOW    Comment: BIOCHEMICALS MAY BE AFFECTED BY COLOR   APPearance CLOUDY (A) CLEAR   Specific Gravity, Urine 1.018 1.005 - 1.030   pH 5.0 5.0 - 8.0   Glucose, UA NEGATIVE NEGATIVE mg/dL   Hgb urine dipstick MODERATE (A) NEGATIVE   Bilirubin Urine SMALL (A) NEGATIVE   Ketones, ur NEGATIVE NEGATIVE mg/dL   Protein, ur 30 (A) NEGATIVE mg/dL   Nitrite NEGATIVE NEGATIVE   Leukocytes, UA NEGATIVE NEGATIVE   RBC / HPF 6-10 0 - 5 RBC/hpf   WBC, UA 6-10 0 - 5 WBC/hpf   Bacteria, UA RARE (A) NONE SEEN   Squamous Epithelial / LPF 0-5 0 - 5   Mucus PRESENT     Comment: Performed at [facility], [address] [place], [place] [address]  Strep pneumoniae urinary antigen     Status: Abnormal   Collection Time: 12/10/17  9:45 AM  Result Value Ref Range   Strep Pneumo Urinary Antigen POSITIVE (A) NEGATIVE    Comment: PERFORMED AT [facility] Performed at [facility], [place] [address], [place], [place] [address]   Lactic acid, plasma     Status: Abnormal   Collection Time: 12/10/17 10:47 AM  Result Value Ref Range   Lactic Acid, Venous 4.0 (HH) 0.5 - 1.9 mmol/L    Comment: CRITICAL RESULT CALLED TO, READ BACK BY AND VERIFIED WITH: MARTIN @ 1207 ON [id number] BY HENDERSONL. Performed at [facility], [address] [place], [place] [address]   MRSA PCR Screening     Status: None   Collection Time: 12/10/17 10:56 AM  Result Value Ref Range   MRSA by PCR NEGATIVE NEGATIVE    Comment:        The GeneXpert MRSA Assay (FDA approved for NASAL specimens only), is one component of a comprehensive MRSA colonization surveillance program. It is not intended to diagnose MRSA infection nor to guide or monitor treatment for MRSA infections. Performed at [facility], [address] [place], [place] [address]   Lactic acid, plasma     Status: Abnormal   Collection Time: 12/10/17 12:40 PM  Result Value Ref Range   Lactic Acid, Venous 3.3  (HH) 0.5 - 1.9 mmol/L    Comment: CRITICAL RESULT CALLED TO, READ  BACK BY AND VERIFIED WITH: HYLTON @ 6962 ON [id number] BY HENDERSON L. Performed at [facility], [address], [place], [place] [address]   Blood gas, arterial     Status: Abnormal   Collection Time: 12/10/17  3:05 PM  Result Value Ref Range   O2 Content 3.0 L/min   Delivery systems NASAL CANNULA    pH, Arterial 7.487 (H) 7.350 - 7.450   pCO2 arterial 25.5 (L) 32.0 - 48.0 mmHg   pO2, Arterial 82.9 (L) 83.0 - 108.0 mmHg   Bicarbonate 22.0 20.0 - 28.0 mmol/L   Acid-base deficit 3.8 (H) 0.0 - 2.0 mmol/L   O2 Saturation 96.3 %   Collection site LEFT RADIAL    Drawn by [id number]    Sample type ARTERIAL    Allens test (pass/fail) PASS PASS    Comment: Performed at [facility], [address] [place], [place] [address]  Lactic acid, plasma     Status: Abnormal   Collection Time: 12/10/17  4:05 PM  Result Value Ref Range   Lactic Acid, Venous 3.9 (HH) 0.5 - 1.9 mmol/L    Comment: CRITICAL RESULT CALLED TO, READ BACK BY AND VERIFIED WITH: MARTINL. @ 1647 ON [id number] BY HENDERSON L. Performed at [facility], [address] [place], [place] [address]   Lactic acid, plasma     Status: Abnormal   Collection Time: 12/10/17  6:54 PM  Result Value Ref Range   Lactic Acid, Venous 3.7 (HH) 0.5 - 1.9 mmol/L    Comment: CRITICAL RESULT CALLED TO, READ BACK BY AND VERIFIED WITH: CUMMINGS,R @ 1900 ON 12/10/17 BY JUW Performed at [facility], [address] [place], [place] [address]   Basic metabolic panel     Status: Abnormal   Collection Time: 12/11/17  4:32 AM  Result Value Ref Range   Sodium 137 135 - 145 mmol/L   Potassium 3.0 (L) 3.5 - 5.1 mmol/L   Chloride 103 98 - 111 mmol/L   CO2 22 22 - 32 mmol/L   Glucose, Bld 173 (H) 70 - 99 mg/dL   BUN 15 6 - 20 mg/dL   Creatinine, Ser 0.95 0.44 - 1.00 mg/dL   Calcium 7.3 (L) 8.9 - 10.3 mg/dL   GFR calc non Af Amer >60 >60 mL/min   GFR calc Af Amer >60 >60 mL/min    Comment:  (NOTE) The eGFR has been calculated using the CKD EPI equation. This calculation has not been validated in all clinical situations. eGFR's persistently <60 mL/min signify possible Chronic Kidney Disease.    Anion gap 12 5 - 15    Comment: Performed at [facility], [address], [place], [place] [address]  CBC     Status: Abnormal   Collection Time: 12/11/17  4:32 AM  Result Value Ref Range   WBC 6.1 4.0 - 10.5 K/uL   RBC 3.39 (L) 3.87 - 5.11 MIL/uL   Hemoglobin 10.3 (L) 12.0 - 15.0 g/dL   HCT 31.0 (L) 36.0 - 46.0 %   MCV 91.4 80.0 - 100.0 fL   MCH 30.4 26.0 - 34.0 pg   MCHC 33.2 30.0 - 36.0 g/dL   RDW 12.3 11.5 - 15.5 %   Platelets 204 150 - 400 K/uL   nRBC 0.0 0.0 - 0.2 %    Comment: Performed at [facility], [address] [place] [address], [place], [place] [address]  Procalcitonin     Status: None   Collection Time: 12/11/17  4:32 AM  Result Value Ref Range  Procalcitonin 0.27 ng/mL    Comment:        Interpretation: PCT (Procalcitonin) <= 0.5 ng/mL: Systemic infection (sepsis) is not likely. Local bacterial infection is possible. (NOTE)       Sepsis PCT Algorithm           Lower Respiratory Tract                                      Infection PCT Algorithm    ----------------------------     ----------------------------         PCT < 0.25 ng/mL                PCT < 0.10 ng/mL         Strongly encourage             Strongly discourage   discontinuation of antibiotics    initiation of antibiotics    ----------------------------     -----------------------------       PCT 0.25 - 0.50 ng/mL            PCT 0.10 - 0.25 ng/mL               OR       >80% decrease in PCT            Discourage initiation of                                            antibiotics      Encourage discontinuation           of antibiotics    ----------------------------     -----------------------------         PCT >= 0.50 ng/mL              PCT 0.26 - 0.50 ng/mL               AND        <80% decrease in PCT              Encourage initiation of                                             antibiotics       Encourage continuation           of antibiotics    ----------------------------     -----------------------------        PCT >= 0.50 ng/mL                  PCT > 0.50 ng/mL               AND         increase in PCT                  Strongly encourage                                      initiation of antibiotics    Strongly encourage escalation           of antibiotics                                     -----------------------------  PCT <= 0.25 ng/mL                                                 OR                                        > 80% decrease in PCT                                     Discontinue / Do not initiate                                             antibiotics Performed at [facility], [address], [place], [place] [address]   Hepatic function panel     Status: Abnormal   Collection Time: 12/11/17  4:32 AM  Result Value Ref Range   Total Protein 6.8 6.5 - 8.1 g/dL   Albumin 2.8 (L) 3.5 - 5.0 g/dL   AST 17 15 - 41 U/L   ALT 14 0 - 44 U/L   Alkaline Phosphatase 87 38 - 126 U/L   Total Bilirubin 3.1 (H) 0.3 - 1.2 mg/dL   Bilirubin, Direct 1.7 (H) 0.0 - 0.2 mg/dL   Indirect Bilirubin 1.4 (H) 0.3 - 0.9 mg/dL    Comment: Performed at [facility], [address] [place], [place] [address]  Magnesium     Status: None   Collection Time: 12/11/17  4:32 AM  Result Value Ref Range   Magnesium 1.8 1.7 - 2.4 mg/dL    Comment: Performed at [facility], [address] [place], [place] [address]  Phosphorus     Status: Abnormal   Collection Time: 12/11/17  4:32 AM  Result Value Ref Range   Phosphorus 1.9 (L) 2.5 - 4.6 mg/dL    Comment: Performed at [facility], [address] [place], [place] [address]    Ct Abdomen Pelvis Wo Contrast  Result Date: 12/10/2017 CLINICAL DATA:  57 year old female with history of increasing  shortness of breath. Hypoxia. Generalized abdominal pain. Admitted to the hospital for sepsis. EXAM: CT CHEST, ABDOMEN AND PELVIS WITHOUT CONTRAST TECHNIQUE: Multidetector CT imaging of the chest, abdomen and pelvis was performed following the standard protocol without IV contrast. COMPARISON:  CT the abdomen and pelvis 02/15/2017. Chest CT 12/02/2008. FINDINGS: CT CHEST FINDINGS Cardiovascular: Heart size is normal. There is no significant pericardial fluid, thickening or pericardial calcification. Aortic atherosclerosis. No coronary artery calcifications are noted. Mediastinum/Nodes: Lymphadenopathy in the right paratracheal nodal stations measuring up to 16 mm in the high right paratracheal nodal station. Esophagus is unremarkable in appearance. No axillary lymphadenopathy. Lungs/Pleura: Extensive airspace consolidation throughout the right upper lobe, as well as a small portion of the superior segment of the right lower lobe. Areas of mild linear scarring in the periphery of the left lung. Trace right pleural effusion. No left pleural effusion. No definite suspicious appearing pulmonary nodules. Musculoskeletal: There are no aggressive appearing lytic or blastic lesions noted in the visualized portions of the skeleton. CT ABDOMEN PELVIS FINDINGS Hepatobiliary: Diffuse low attenuation throughout  the hepatic parenchyma, indicative of hepatic steatosis. No definite suspicious cystic or solid hepatic lesions are noted on today's noncontrast CT examination. Status post cholecystectomy. Pancreas: No definite pancreatic mass or peripancreatic fluid or inflammatory changes are noted on today's noncontrast CT examination. Spleen: Unremarkable. Adrenals/Urinary Tract: Unenhanced appearance of both kidneys and adrenal glands is normal. No hydroureteronephrosis. Urinary bladder is normal in appearance. Stomach/Bowel: Stomach is normal in appearance. Severe dilatation of the small bowel measuring up to 5.5 cm in diameter,  with multiple air-fluid levels. No definite transition point identified. Oral contrast material administered does extend into the colon to the level of the hepatic flexure. Distal colon is relatively decompressed. Normal appendix. Vascular/Lymphatic: Aortic atherosclerosis. No lymphadenopathy noted in the abdomen or pelvis. Reproductive: Status post hysterectomy. Ovaries are not confidently identified may be surgically absent or atrophic. Other: No significant volume of ascites.  No pneumoperitoneum. Musculoskeletal: Status post PLIF at L3-L4 with interbody graft at L3-L4 interspace. There are no aggressive appearing lytic or blastic lesions noted in the visualized portions of the skeleton. IMPRESSION: 1. Extensive airspace consolidation in the right lung, most confluent throughout the right upper lobe, most compatible with pneumonia. No definite centrally obstructing lesion identified at this time. Lymphadenopathy in the mediastinum is favored to be reactive. Follow-up chest radiographs are recommended to ensure complete resolution of these findings. 2. Severe dilatation of the mid small bowel, with findings concerning for partial small bowel obstruction. 3. Trace right pleural effusion lying dependently. 4. Aortic atherosclerosis. Electronically Signed   By: Vinnie Langton M.D.   On: 12/10/2017 19:53   Dg Chest 2 View  Result Date: 12/10/2017 CLINICAL DATA:  Acute shortness of breath. History of LEFT lung cancer. EXAM: CHEST - 2 VIEW COMPARISON:  02/06/2017 chest radiograph and prior studies FINDINGS: A moderate to large area of RIGHT UPPER lobe consolidation noted. The LEFT lung is clear. Surgical clips in the LEFT hilum again noted. No pleural effusion or pneumothorax. Cardiomediastinal silhouette is otherwise unremarkable. No acute bony abnormalities identified. IMPRESSION: RIGHT UPPER lobe consolidation. If there is strong clinical suspicion for pneumonia, short-term radiographic follow-up following  appropriate therapy recommended. Otherwise chest CT with contrast is recommended. Electronically Signed   By: Margarette Canada M.D.   On: 12/10/2017 08:21   Ct Chest Wo Contrast  Result Date: 12/10/2017 CLINICAL DATA:  57 year old female with history of increasing shortness of breath. Hypoxia. Generalized abdominal pain. Admitted to the hospital for sepsis. EXAM: CT CHEST, ABDOMEN AND PELVIS WITHOUT CONTRAST TECHNIQUE: Multidetector CT imaging of the chest, abdomen and pelvis was performed following the standard protocol without IV contrast. COMPARISON:  CT the abdomen and pelvis 02/15/2017. Chest CT 12/02/2008. FINDINGS: CT CHEST FINDINGS Cardiovascular: Heart size is normal. There is no significant pericardial fluid, thickening or pericardial calcification. Aortic atherosclerosis. No coronary artery calcifications are noted. Mediastinum/Nodes: Lymphadenopathy in the right paratracheal nodal stations measuring up to 16 mm in the high right paratracheal nodal station. Esophagus is unremarkable in appearance. No axillary lymphadenopathy. Lungs/Pleura: Extensive airspace consolidation throughout the right upper lobe, as well as a small portion of the superior segment of the right lower lobe. Areas of mild linear scarring in the periphery of the left lung. Trace right pleural effusion. No left pleural effusion. No definite suspicious appearing pulmonary nodules. Musculoskeletal: There are no aggressive appearing lytic or blastic lesions noted in the visualized portions of the skeleton. CT ABDOMEN PELVIS FINDINGS Hepatobiliary: Diffuse low attenuation throughout the hepatic parenchyma, indicative of hepatic steatosis. No definite suspicious  cystic or solid hepatic lesions are noted on today's noncontrast CT examination. Status post cholecystectomy. Pancreas: No definite pancreatic mass or peripancreatic fluid or inflammatory changes are noted on today's noncontrast CT examination. Spleen: Unremarkable. Adrenals/Urinary  Tract: Unenhanced appearance of both kidneys and adrenal glands is normal. No hydroureteronephrosis. Urinary bladder is normal in appearance. Stomach/Bowel: Stomach is normal in appearance. Severe dilatation of the small bowel measuring up to 5.5 cm in diameter, with multiple air-fluid levels. No definite transition point identified. Oral contrast material administered does extend into the colon to the level of the hepatic flexure. Distal colon is relatively decompressed. Normal appendix. Vascular/Lymphatic: Aortic atherosclerosis. No lymphadenopathy noted in the abdomen or pelvis. Reproductive: Status post hysterectomy. Ovaries are not confidently identified may be surgically absent or atrophic. Other: No significant volume of ascites.  No pneumoperitoneum. Musculoskeletal: Status post PLIF at L3-L4 with interbody graft at L3-L4 interspace. There are no aggressive appearing lytic or blastic lesions noted in the visualized portions of the skeleton. IMPRESSION: 1. Extensive airspace consolidation in the right lung, most confluent throughout the right upper lobe, most compatible with pneumonia. No definite centrally obstructing lesion identified at this time. Lymphadenopathy in the mediastinum is favored to be reactive. Follow-up chest radiographs are recommended to ensure complete resolution of these findings. 2. Severe dilatation of the mid small bowel, with findings concerning for partial small bowel obstruction. 3. Trace right pleural effusion lying dependently. 4. Aortic atherosclerosis. Electronically Signed   By: Vinnie Langton M.D.   On: 12/10/2017 19:53   Dg Chest Port 1 View  Result Date: 12/10/2017 CLINICAL DATA:  Respiratory distress EXAM: PORTABLE CHEST 1 VIEW COMPARISON:  12/10/2017. FINDINGS: Increased dense RIGHT UPPER lung consolidation noted. Cardiomediastinal silhouette is otherwise unremarkable. LEFT lung is clear. No pleural effusion or pneumothorax. IMPRESSION: Increasing dense RIGHT UPPER  lung consolidation. Electronically Signed   By: Margarette Canada M.D.   On: 12/10/2017 16:19   US Abdomen Limited Ruq  Result Date: 12/11/2017 CLINICAL DATA:  Hyperbilirubinemia, history GERD, hypertension, hyperlipidemia, former smoker EXAM: ULTRASOUND ABDOMEN LIMITED RIGHT UPPER QUADRANT COMPARISON:  CT abdomen and pelvis 12/10/2017 FINDINGS: Gallbladder: Surgically absent Common bile duct: Diameter: 4 mm diameter, normal Liver: Normal morphology. Smooth contours without mass or nodularity. Slightly heterogeneous echogenicity which could represent subtle geographic fatty infiltration. Portal vein is patent on color Doppler imaging with normal direction of blood flow towards the liver. No RIGHT upper quadrant free fluid. IMPRESSION: Mildly heterogeneous fatty parenchyma which could reflect areas of mild geographic fatty infiltration. No discrete hepatic mass or nodularity seen. Post cholecystectomy without biliary dilatation. Electronically Signed   By: Lavonia Dana M.D.   On: 12/11/2017 10:59    ROS:  Pertinent items are noted in HPI.  Blood pressure 124/67, pulse (!) 106, temperature 98.5 F (36.9 C), temperature source Oral, resp. rate (!) 26, height '5\' 4"'  (1.626 m), weight 81.3 kg, SpO2 96 %. Physical Exam: Pleasant black female no acute distress Head is normocephalic, atraumatic Heart examination reveals sinus tachycardia. Abdomen is slightly distended but soft, nontender.  Bowel sounds appreciated.  No hernias appreciated.  No rigidity noted.  CT scan images personally reviewed  Assessment/Plan: Impression: Partial small bowel obstruction secondary to an ileus from her pneumonia.  No need for acute surgical intervention at this time. Plan: We will advance to full liquid diet.  Patient's diet may be advanced once her bowel function fully returns.  Will follow with you.  Aviva Signs 12/11/2017, 11:51 AM

## 2017-12-11 NOTE — Progress Notes (Signed)
Sinus tach w/ HR into 120s  Baseline resp distress/acute resp failure w/ hypoxia w/ concern for likely fluid overload  Will give low dose metoprolol x1 Follow closely

## 2017-12-11 NOTE — Progress Notes (Signed)
Dr. Ernestina Patches notified of current vital signs: 98.5-120-21  [id number] map 73.

## 2017-12-11 NOTE — Progress Notes (Signed)
PROGRESS NOTE  [name] AYO:[id number] DOB: 1960-04-04 DOA: 12/10/2017 PCP: Guadalupe Dawn, MD  Brief History:   57 y.o. female with medical history of hypertension, hyperlipidemia, constipation, left lung cancer 1997 status post wedge resection presenting with 4-day history of generalized weakness, shortness of breath, dizziness, and decreased oral intake.  The patient had been in her usual health until 12/06/2017 when she complained of the above symptoms.  Patient's had associated subjective fevers and chills and nonproductive cough.  She had some nausea but denied any vomiting or diarrhea.  The patient took 2 Aleve on 12/06/2017, but has not taken any NSAIDs since then.  She denies any other new medications.  On the evening of 12/09/2017,, the patient sustained a mechanical fall secondary to her worsening generalized weakness.  The patient stated that she was on her way to the bathroom on her legs gave out.  She gives a history of increasing falls in the past week secondary to her generalized weakness.  She denies any syncope.  The patient has been complaining of a sore throat but denies any headache or neck pain.  She is also been complaining of upper abdominal tenderness in the epigastric and left upper quadrant and right upper quadrant areas that began on 12/05/2017.  The patient did have one episode of emesis in the emergency department.  Prior to this episode, the patient had denied any vomiting.  The patient was admitted for treatment of community acquired pneumonia noted on chest x-ray with right upper lobe consolidation.  On the evening of 12/10/2017, the patient had respiratory distress secondary to fluid overload.  She was given furosemide 40 mg IV x1 with improvement.  CT of the chest confirmed right upper lobe consolidation extending into the right lower lobe.  CT of the abdomen and pelvis suggested partial small bowel obstruction.  The patient was placed on bowel rest.  General  surgery was consulted to assist with management.  Assessment/Plan: Sepsis -Secondary to pneumonia -Lactic acid 5.6 -The patient received the appropriate fluid boluses in the emergency department -restart IV fluids at lower rate -Follow blood cultures -UA--no significant pyuria -Check procalcitonin 47.36>>>0.27 -Continue ceftriaxone and azithromycin pending culture data  Lobar pneumonia -Urine Legionella antigen--pending -Urine Streptococcus pneumoniae antigen-POSITIVE -12/10/2017 CT chest--right upper lobe consolidation extending to the right lower lobe.  Trace right pleural effusion.  Right paratracheal adenopathy -Start duo nebs -Viral respiratory panel--pending  Partial small bowel obstruction -12/10/2017 CT abdomen--small bowel dilatation up to 5.5 cm with air-fluid levels, but no transition point.  Contrast extends into the colon and hepatic flexure.  Status post cholecystectomy and hysterectomy. -General surgery consult -Allow sips with medications pending general surgery evaluation  COPD exacerbation -Patient continues to have some wheezing on exam -Start IV Solu-Medrol -Start Pulmicort -Continue duo nebs  AKI -Secondary to sepsis and volume depletion -Baseline creatinine 0.8-0.9 -Serum creatinine peaked 1.57 -Continue IV fluids -Check CPK--112 -Discontinue meloxicam  Sinus tachycardia -Stressed induced from the patient's acute medical condition -Hold additional beta-blockers for now unless the patient is symptomatic or hemodynamically unstable  Hyperbilirubinemia -Fractionate bilirubin--more direct -CT abdomen pelvis--as noted above -RUQ ultrasound  Essential hypertension -Holding amlodipine secondary to soft blood pressure  Chronic pain -Continue home doses of Lyrica and Cymbalta  Hyperlipidemia -Continue statin  GERD/Schatzki's ring -Continue PPI  Hypokalemia/hypomagnesemia/hypophosphatemia -Replete  Hyperglycemia -Hemoglobin  A1c--5.5 -Likely stress-induced    Disposition Plan:   Home in 2-3 days  Family Communication:  Spouse  Family at bedside-11/24  Consultants:  none  Code Status:  FULL   DVT Prophylaxis:  [place] Lovenox  The patient is critically ill with multiple organ systems failure and requires high complexity decision making for assessment and support, frequent evaluation and titration of therapies, application of advanced monitoring technologies and extensive interpretation of multiple databases.  Critical care time - 35 mins.     Procedures: As Listed in Progress Note Above  Antibiotics: Ceftriaxone 11/23>>> Azithromycin 11/23>>>       Subjective: Patient still feels short of breath but better than yesterday.  She continues to have abdominal pain which is about the same as yesterday.  She is passing flatus but no significant bowel movement.  She denies any further vomiting, dysuria, hematuria.  There is no fevers, chills, headache, neck pain.  Objective: Vitals:   12/11/17 0500 12/11/17 0600 12/11/17 0700 12/11/17 0715  BP: 122/66 115/69 98/60   Pulse: (!) 123 (!) 105 (!) 106 (!) 115  Resp: (!) 27 (!) 23 (!) 22 (!) 28  Temp:    98.7 F (37.1 C)  TempSrc:    Oral  SpO2:    91%  Weight:      Height:        Intake/Output Summary (Last 24 hours) at 12/11/2017 1610 Last data filed at 12/10/2017 2342 Gross per 24 hour  Intake 4887.44 ml  Output -  Net 4887.44 ml   Weight change:  Exam:   General:  Pt is alert, follows commands appropriately, not in acute distress  HEENT: No icterus, No thrush, No neck mass, [place]/AT  Cardiovascular: RRR, S1/S2, no rubs, no gallops  Respiratory: Bibasilar expiratory wheeze peer diminished breath sounds on the right.  Bibasilar rales.  Abdomen: Soft/+BS, non tender, non distended, no guarding  Extremities: No edema, No lymphangitis, No petechiae, No rashes, no synovitis   Data Reviewed: I have personally reviewed following labs and  imaging studies Basic Metabolic Panel: Recent Labs  Lab 12/10/17 0730 12/10/17 0749 12/11/17 0432  NA 133*  --  137  K 3.2*  --  3.0*  CL 97*  --  103  CO2 20*  --  22  GLUCOSE 133*  --  173*  BUN 24*  --  15  CREATININE 1.57*  --  0.95  CALCIUM 8.2*  --  7.3*  MG  --  1.2*  --   PHOS  --   --  1.9*   Liver Function Tests: Recent Labs  Lab 12/10/17 0730 12/10/17 0749 12/11/17 0432  AST 25  --  17  ALT 17  --  14  ALKPHOS 131*  --  87  BILITOT 5.8* 4.7* 3.1*  PROT 7.9  --  6.8  ALBUMIN 3.4*  --  2.8*   Recent Labs  Lab 12/10/17 0730  LIPASE 17   No results for input(s): AMMONIA in the last 168 hours. Coagulation Profile: Recent Labs  Lab 12/10/17 0730  INR 1.48   CBC: Recent Labs  Lab 12/10/17 0730 12/11/17 0432  WBC 5.5 6.1  NEUTROABS 4.6  --   HGB 13.0 10.3*  HCT 39.1 31.0*  MCV 90.1 91.4  PLT 211 204   Cardiac Enzymes: Recent Labs  Lab 12/10/17 0730 12/10/17 0749  CKTOTAL  --  112  TROPONINI <0.03  --    BNP: Invalid input(s): POCBNP CBG: No results for input(s): GLUCAP in the last 168 hours. HbA1C: Recent Labs    12/10/17 0749  HGBA1C 5.5   Urine analysis:  Component Value Date/Time   COLORURINE AMBER (A) 12/10/2017 0945   APPEARANCEUR CLOUDY (A) 12/10/2017 0945   LABSPEC 1.018 12/10/2017 0945   PHURINE 5.0 12/10/2017 0945   GLUCOSEU NEGATIVE 12/10/2017 0945   HGBUR MODERATE (A) 12/10/2017 0945   HGBUR trace-intact 03/09/2007 1414   BILIRUBINUR SMALL (A) 12/10/2017 0945   BILIRUBINUR negative 04/14/2016 0919   BILIRUBINUR neg 09/27/2012 0850   KETONESUR NEGATIVE 12/10/2017 0945   PROTEINUR 30 (A) 12/10/2017 0945   UROBILINOGEN 4.0 (A) 04/14/2016 0919   UROBILINOGEN 0.2 08/05/2013 1025   NITRITE NEGATIVE 12/10/2017 0945   LEUKOCYTESUR NEGATIVE 12/10/2017 0945   Sepsis Labs: @LABRCNTIP (procalcitonin:4,lacticidven:4) ) Recent Results (from the past 240 hour(s))  Culture, blood (routine x 2)     Status: None  (Preliminary result)   Collection Time: 12/10/17  8:33 AM  Result Value Ref Range Status   Specimen Description LEFT ANTECUBITAL  Final   Special Requests   Final    BOTTLES DRAWN AEROBIC AND ANAEROBIC Blood Culture adequate volume   Culture   Final    NO GROWTH < 24 HOURS Performed at [facility], [address] [place], [place] [address]    Report Status PENDING  Incomplete  Culture, blood (routine x 2)     Status: None (Preliminary result)   Collection Time: 12/10/17  8:40 AM  Result Value Ref Range Status   Specimen Description BLOOD LEFT HAND  Final   Special Requests   Final    BOTTLES DRAWN AEROBIC AND ANAEROBIC Blood Culture adequate volume   Culture   Final    NO GROWTH < 24 HOURS Performed at [facility], [address] [place], [place] [address]    Report Status PENDING  Incomplete  MRSA PCR Screening     Status: None   Collection Time: 12/10/17 10:56 AM  Result Value Ref Range Status   MRSA by PCR NEGATIVE NEGATIVE Final    Comment:        The GeneXpert MRSA Assay (FDA approved for NASAL specimens only), is one component of a comprehensive MRSA colonization surveillance program. It is not intended to diagnose MRSA infection nor to guide or monitor treatment for MRSA infections. Performed at [facility], [address], [place], [place] [address]      Scheduled Meds: . amitriptyline  150 mg Oral QHS  . atorvastatin  40 mg Oral Daily  . DULoxetine  60 mg Oral BID  . enoxaparin (LOVENOX) injection  40 mg Subcutaneous Q24H  . ipratropium-albuterol  3 mL Nebulization Q6H WA  . lubiprostone  24 mcg Oral BID WC  . methylPREDNISolone (SOLU-MEDROL) injection  60 mg Intravenous Daily  . pantoprazole  40 mg Oral Daily  . pregabalin  100 mg Oral TID  . sodium chloride flush  3 mL Intravenous Q12H  . sodium chloride flush  3 mL Intravenous Q12H   Continuous Infusions: . sodium chloride    . 0.9 % NaCl with KCl 20 mEq / L    . azithromycin    . cefTRIAXone  (ROCEPHIN)  IV    . magnesium sulfate 1 - 4 g bolus IVPB 2 g (12/11/17 0733)  . potassium PHOSPHATE IVPB (mEq)      Procedures/Studies: Ct Abdomen Pelvis Wo Contrast  Result Date: 12/10/2017 CLINICAL DATA:  57 year old female with history of increasing shortness of breath. Hypoxia. Generalized abdominal pain. Admitted to the hospital for sepsis. EXAM: CT CHEST, ABDOMEN AND PELVIS WITHOUT CONTRAST TECHNIQUE: Multidetector CT imaging of the chest, abdomen and  pelvis was performed following the standard protocol without IV contrast. COMPARISON:  CT the abdomen and pelvis 02/15/2017. Chest CT 12/02/2008. FINDINGS: CT CHEST FINDINGS Cardiovascular: Heart size is normal. There is no significant pericardial fluid, thickening or pericardial calcification. Aortic atherosclerosis. No coronary artery calcifications are noted. Mediastinum/Nodes: Lymphadenopathy in the right paratracheal nodal stations measuring up to 16 mm in the high right paratracheal nodal station. Esophagus is unremarkable in appearance. No axillary lymphadenopathy. Lungs/Pleura: Extensive airspace consolidation throughout the right upper lobe, as well as a small portion of the superior segment of the right lower lobe. Areas of mild linear scarring in the periphery of the left lung. Trace right pleural effusion. No left pleural effusion. No definite suspicious appearing pulmonary nodules. Musculoskeletal: There are no aggressive appearing lytic or blastic lesions noted in the visualized portions of the skeleton. CT ABDOMEN PELVIS FINDINGS Hepatobiliary: Diffuse low attenuation throughout the hepatic parenchyma, indicative of hepatic steatosis. No definite suspicious cystic or solid hepatic lesions are noted on today's noncontrast CT examination. Status post cholecystectomy. Pancreas: No definite pancreatic mass or peripancreatic fluid or inflammatory changes are noted on today's noncontrast CT examination. Spleen: Unremarkable. Adrenals/Urinary  Tract: Unenhanced appearance of both kidneys and adrenal glands is normal. No hydroureteronephrosis. Urinary bladder is normal in appearance. Stomach/Bowel: Stomach is normal in appearance. Severe dilatation of the small bowel measuring up to 5.5 cm in diameter, with multiple air-fluid levels. No definite transition point identified. Oral contrast material administered does extend into the colon to the level of the hepatic flexure. Distal colon is relatively decompressed. Normal appendix. Vascular/Lymphatic: Aortic atherosclerosis. No lymphadenopathy noted in the abdomen or pelvis. Reproductive: Status post hysterectomy. Ovaries are not confidently identified may be surgically absent or atrophic. Other: No significant volume of ascites.  No pneumoperitoneum. Musculoskeletal: Status post PLIF at L3-L4 with interbody graft at L3-L4 interspace. There are no aggressive appearing lytic or blastic lesions noted in the visualized portions of the skeleton. IMPRESSION: 1. Extensive airspace consolidation in the right lung, most confluent throughout the right upper lobe, most compatible with pneumonia. No definite centrally obstructing lesion identified at this time. Lymphadenopathy in the mediastinum is favored to be reactive. Follow-up chest radiographs are recommended to ensure complete resolution of these findings. 2. Severe dilatation of the mid small bowel, with findings concerning for partial small bowel obstruction. 3. Trace right pleural effusion lying dependently. 4. Aortic atherosclerosis. Electronically Signed   By: Vinnie Langton M.D.   On: 12/10/2017 19:53   Dg Chest 2 View  Result Date: 12/10/2017 CLINICAL DATA:  Acute shortness of breath. History of LEFT lung cancer. EXAM: CHEST - 2 VIEW COMPARISON:  02/06/2017 chest radiograph and prior studies FINDINGS: A moderate to large area of RIGHT UPPER lobe consolidation noted. The LEFT lung is clear. Surgical clips in the LEFT hilum again noted. No pleural  effusion or pneumothorax. Cardiomediastinal silhouette is otherwise unremarkable. No acute bony abnormalities identified. IMPRESSION: RIGHT UPPER lobe consolidation. If there is strong clinical suspicion for pneumonia, short-term radiographic follow-up following appropriate therapy recommended. Otherwise chest CT with contrast is recommended. Electronically Signed   By: Margarette Canada M.D.   On: 12/10/2017 08:21   Ct Chest Wo Contrast  Result Date: 12/10/2017 CLINICAL DATA:  57 year old female with history of increasing shortness of breath. Hypoxia. Generalized abdominal pain. Admitted to the hospital for sepsis. EXAM: CT CHEST, ABDOMEN AND PELVIS WITHOUT CONTRAST TECHNIQUE: Multidetector CT imaging of the chest, abdomen and pelvis was performed following the standard protocol without IV contrast.  COMPARISON:  CT the abdomen and pelvis 02/15/2017. Chest CT 12/02/2008. FINDINGS: CT CHEST FINDINGS Cardiovascular: Heart size is normal. There is no significant pericardial fluid, thickening or pericardial calcification. Aortic atherosclerosis. No coronary artery calcifications are noted. Mediastinum/Nodes: Lymphadenopathy in the right paratracheal nodal stations measuring up to 16 mm in the high right paratracheal nodal station. Esophagus is unremarkable in appearance. No axillary lymphadenopathy. Lungs/Pleura: Extensive airspace consolidation throughout the right upper lobe, as well as a small portion of the superior segment of the right lower lobe. Areas of mild linear scarring in the periphery of the left lung. Trace right pleural effusion. No left pleural effusion. No definite suspicious appearing pulmonary nodules. Musculoskeletal: There are no aggressive appearing lytic or blastic lesions noted in the visualized portions of the skeleton. CT ABDOMEN PELVIS FINDINGS Hepatobiliary: Diffuse low attenuation throughout the hepatic parenchyma, indicative of hepatic steatosis. No definite suspicious cystic or solid  hepatic lesions are noted on today's noncontrast CT examination. Status post cholecystectomy. Pancreas: No definite pancreatic mass or peripancreatic fluid or inflammatory changes are noted on today's noncontrast CT examination. Spleen: Unremarkable. Adrenals/Urinary Tract: Unenhanced appearance of both kidneys and adrenal glands is normal. No hydroureteronephrosis. Urinary bladder is normal in appearance. Stomach/Bowel: Stomach is normal in appearance. Severe dilatation of the small bowel measuring up to 5.5 cm in diameter, with multiple air-fluid levels. No definite transition point identified. Oral contrast material administered does extend into the colon to the level of the hepatic flexure. Distal colon is relatively decompressed. Normal appendix. Vascular/Lymphatic: Aortic atherosclerosis. No lymphadenopathy noted in the abdomen or pelvis. Reproductive: Status post hysterectomy. Ovaries are not confidently identified may be surgically absent or atrophic. Other: No significant volume of ascites.  No pneumoperitoneum. Musculoskeletal: Status post PLIF at L3-L4 with interbody graft at L3-L4 interspace. There are no aggressive appearing lytic or blastic lesions noted in the visualized portions of the skeleton. IMPRESSION: 1. Extensive airspace consolidation in the right lung, most confluent throughout the right upper lobe, most compatible with pneumonia. No definite centrally obstructing lesion identified at this time. Lymphadenopathy in the mediastinum is favored to be reactive. Follow-up chest radiographs are recommended to ensure complete resolution of these findings. 2. Severe dilatation of the mid small bowel, with findings concerning for partial small bowel obstruction. 3. Trace right pleural effusion lying dependently. 4. Aortic atherosclerosis. Electronically Signed   By: Vinnie Langton M.D.   On: 12/10/2017 19:53   Dg Chest Port 1 View  Result Date: 12/10/2017 CLINICAL DATA:  Respiratory distress  EXAM: PORTABLE CHEST 1 VIEW COMPARISON:  12/10/2017. FINDINGS: Increased dense RIGHT UPPER lung consolidation noted. Cardiomediastinal silhouette is otherwise unremarkable. LEFT lung is clear. No pleural effusion or pneumothorax. IMPRESSION: Increasing dense RIGHT UPPER lung consolidation. Electronically Signed   By: Margarette Canada M.D.   On: 12/10/2017 16:19    Orson Eva, DO  [facility] Pager [phone]  If 7PM-7AM, please contact night-coverage [email or web address] Password [email or web address] 12/11/2017, 8:22 AM   LOS: 1 day

## 2017-12-12 DIAGNOSIS — R0603 Acute respiratory distress: Secondary | ICD-10-CM

## 2017-12-12 LAB — LEGIONELLA PNEUMOPHILA SEROGP 1 UR AG: L. pneumophila Serogp 1 Ur Ag: NEGATIVE

## 2017-12-12 LAB — COMPREHENSIVE METABOLIC PANEL
ALBUMIN: 2.6 g/dL — AB (ref 3.5–5.0)
ALK PHOS: 86 U/L (ref 38–126)
ALT: 18 U/L (ref 0–44)
AST: 24 U/L (ref 15–41)
Anion gap: 7 (ref 5–15)
BILIRUBIN TOTAL: 1.6 mg/dL — AB (ref 0.3–1.2)
BUN: 13 mg/dL (ref 6–20)
CALCIUM: 7.6 mg/dL — AB (ref 8.9–10.3)
CO2: 26 mmol/L (ref 22–32)
CREATININE: 0.82 mg/dL (ref 0.44–1.00)
Chloride: 106 mmol/L (ref 98–111)
GFR calc Af Amer: >60 mL/min (ref >60)
GLUCOSE: 136 mg/dL — AB (ref 70–99)
Potassium: 3.4 mmol/L — ABNORMAL LOW (ref 3.5–5.1)
Sodium: 139 mmol/L (ref 135–145)
TOTAL PROTEIN: 6.7 g/dL (ref 6.5–8.1)

## 2017-12-12 LAB — CBC
HCT: 28.2 % — ABNORMAL LOW (ref 36.0–46.0)
Hemoglobin: 9.2 g/dL — ABNORMAL LOW (ref 12.0–15.0)
MCH: 29.5 pg (ref 26.0–34.0)
MCHC: 32.6 g/dL (ref 30.0–36.0)
MCV: 90.4 fL (ref 80.0–100.0)
Platelets: 204 10*3/uL (ref 150–400)
RBC: 3.12 MIL/uL — ABNORMAL LOW (ref 3.87–5.11)
RDW: 12.8 % (ref 11.5–15.5)
WBC: 6.4 10*3/uL (ref 4.0–10.5)
nRBC: 0 % (ref 0.0–0.2)

## 2017-12-12 LAB — MAGNESIUM: MAGNESIUM: 2.6 mg/dL — AB (ref 1.7–2.4)

## 2017-12-12 LAB — URINE CULTURE

## 2017-12-12 LAB — PHOSPHORUS: PHOSPHORUS: 1.7 mg/dL — AB (ref 2.5–4.6)

## 2017-12-12 LAB — PROCALCITONIN: Procalcitonin: 29.35 ng/mL

## 2017-12-12 MED ORDER — K PHOS MONO-SOD PHOS DI & MONO 155-852-130 MG PO TABS
500.0000 mg | ORAL_TABLET | Freq: Two times a day (BID) | ORAL | Status: DC
  Administered 2017-12-12 – 2017-12-15 (×6): 500 mg via ORAL
  Filled 2017-12-12 (×10): qty 2

## 2017-12-12 MED ORDER — BISACODYL 10 MG RE SUPP
10.0000 mg | Freq: Two times a day (BID) | RECTAL | Status: DC
  Administered 2017-12-12 – 2017-12-15 (×3): 10 mg via RECTAL
  Filled 2017-12-12 (×5): qty 1

## 2017-12-12 MED ORDER — MAGNESIUM HYDROXIDE 400 MG/5ML PO SUSP
30.0000 mL | Freq: Two times a day (BID) | ORAL | Status: DC
  Administered 2017-12-12 – 2017-12-15 (×3): 30 mL via ORAL
  Filled 2017-12-12 (×5): qty 30

## 2017-12-12 MED ORDER — BUDESONIDE 0.5 MG/2ML IN SUSP
0.5000 mg | Freq: Two times a day (BID) | RESPIRATORY_TRACT | Status: DC
  Administered 2017-12-13 – 2017-12-15 (×4): 0.5 mg via RESPIRATORY_TRACT
  Filled 2017-12-12 (×4): qty 2

## 2017-12-12 MED ORDER — IPRATROPIUM-ALBUTEROL 0.5-2.5 (3) MG/3ML IN SOLN
3.0000 mL | Freq: Three times a day (TID) | RESPIRATORY_TRACT | Status: DC
  Administered 2017-12-13 – 2017-12-15 (×7): 3 mL via RESPIRATORY_TRACT
  Filled 2017-12-12 (×7): qty 3

## 2017-12-12 NOTE — Evaluation (Signed)
Clinical/Bedside Swallow Evaluation Patient Details  Name: [name] MRN: [id number] Date of Birth: 02-13-1960  Today's Date: 12/12/2017 Time: SLP Start Time (ACUTE ONLY): 8315 SLP Stop Time (ACUTE ONLY): 1158 SLP Time Calculation (min) (ACUTE ONLY): 32 min  Past Medical History:  Past Medical History:  Diagnosis Date  . Anxiety   . Asthma    every now and then.  Wheezing and coughing  . Cancer ([place]) 1997   left lung  . Chronic abdominal pain   . Chronic back pain   . Chronic joint pain   . Depression   . GERD (gastroesophageal reflux disease)   . HLD (hyperlipidemia)   . HTN (hypertension)   . Nausea and vomiting    chronic, recurrent  . Osteoarthrosis involving more than one site but not generalized   . Palpitations 01/2013   chronic, intermittent  . Shortness of breath dyspnea    with exertion   Past Surgical History:  Past Surgical History:  Procedure Laterality Date  . ABDOMINAL HYSTERECTOMY    . ABDOMINAL SURGERY     ovarian cyst removal  . BIOPSY N/A 11/22/2013   Procedure: GASTRIC BIOPSY;  Surgeon: Daneil Dolin, MD;  Location: [facility];  Service: Endoscopy;  Laterality: N/A;  . BLADDER SURGERY  suspension x4   x 4  . CARPAL TUNNEL RELEASE Right 07/11/2015   Procedure: RIGHT CARPAL TUNNEL RELEASE;  Surgeon: Jessy Oto, MD;  Location: [facility];  Service: Orthopedics;  Laterality: Right;  . CHOLECYSTECTOMY N/A 04/05/2014   Procedure: LAPAROSCOPIC CHOLECYSTECTOMY;  Surgeon: Aviva Signs Md, MD;  Location: [facility];  Service: General;  Laterality: N/A;  . COCCYGECTOMY N/A 04/11/2015   Procedure: COCCYGECTOMY WITH OSTEOTOMY THROUGH AREA OF DEFORMITY;  Surgeon: Jessy Oto, MD;  Location: [facility];  Service: Orthopedics;  Laterality: N/A;  . COLONOSCOPY    . COLONOSCOPY WITH PROPOFOL N/A 11/22/2013   Dr. Gala Romney: Grade 3 and 4/internal hemorrhoids?"likely source of hematochezia Normal colonoscopy otherwise.   Marland Kitchen COLONOSCOPY WITH PROPOFOL N/A 09/15/2017    Procedure: COLONOSCOPY WITH PROPOFOL;  Surgeon: Daneil Dolin, MD;  [facility];  Service: Endoscopy;  Laterality: N/A;  7:30am  . ESOPHAGOGASTRODUODENOSCOPY (EGD) WITH PROPOFOL N/A 11/22/2013   Dr. Gala Romney: Incomplete Schatzki's ring dilated and disrupted as described above.  Small hiatal hernia. Focally abnormal gastric mucosa of uncertain significance status post biopsy, reactive gastropathy, negative H.pylori  . ESOPHAGOGASTRODUODENOSCOPY (EGD) WITH PROPOFOL N/A 08/04/2015   Dr. Gala Romney: mild Schatzki's ring s/p dilation, small hiatal hernia   . ESOPHAGOGASTRODUODENOSCOPY (EGD) WITH PROPOFOL N/A 03/17/2017   Mild Schatki's ring s/p dilation, small hiatal hernia, otherwise normal  . EXCISION VAGINAL CYST Left 06/20/2012   Procedure: EXCISION LEFT LABIAL CYST;  Surgeon: Jonnie Kind, MD;  Location: [facility];  Service: Gynecology;  Laterality: Left;  . LOBECTOMY Left   . LUNG CANCER SURGERY  1997   age 26, resection only  . MALONEY DILATION N/A 11/22/2013   Procedure: [name] DILATION;  Surgeon: Daneil Dolin, MD;  Location: [facility];  Service: Endoscopy;  Laterality: N/A;  56  . MALONEY DILATION N/A 08/04/2015   Procedure: [name] DILATION;  Surgeon: Daneil Dolin, MD;  [facility];  Service: Endoscopy;  Laterality: N/A;  [name] DILATION N/A 03/17/2017   Procedure: [name] DILATION;  Surgeon: Daneil Dolin, MD;  [facility];  Service: Endoscopy;  Laterality: N/A;  . MASS EXCISION N/A 07/23/2014   Procedure: EXCISIONAL BIOPSY  NODULE LEFT PARACOCCYGEAL AREA;  Surgeon: Jessy Oto, MD;  Location: [place];  Service: Orthopedics;  Laterality: N/A;  . MASS EXCISION Left 10/10/2015   Procedure: EXCISIONAL BIOPSY OF LEFT DORSORADIAL FOREARM MASS LIPOMA;  Surgeon: Jessy Oto, MD;  Location: [place];  Service: Orthopedics;  Laterality: Left;  . TUBAL LIGATION     HPI:  57 y.o. female with medical history of hypertension, hyperlipidemia, constipation,  left lung cancer 1997 status post wedge resection presenting with 4-day history of generalized weakness, shortness of breath, dizziness, and decreased oral intake.  The patient had been in her usual health until 12/06/2017 when she complained of the above symptoms.  Patient's had associated subjective fevers and chills and nonproductive cough.  She had some nausea but denied any vomiting or diarrhea.  The patient took 2 Aleve on 12/06/2017, but has not taken any NSAIDs since then.  She denies any other new medications.  On the evening of 12/09/2017,, the patient sustained a mechanical fall secondary to her worsening generalized weakness.  The patient stated that she was on her way to the bathroom on her legs gave out.  She gives a history of increasing falls in the past week secondary to her generalized weakness.  She denies any syncope.  The patient has been complaining of a sore throat but denies any headache or neck pain.  She is also been complaining of upper abdominal tenderness in the epigastric and left upper quadrant and right upper quadrant areas that began on 12/05/2017.  The patient did have one episode of emesis in the emergency department.  Prior to this episode, the patient had denied any vomiting.  The patient was admitted for treatment of community acquired pneumonia noted on chest x-ray with right upper lobe consolidation.  On the evening of 12/10/2017, the patient had respiratory distress secondary to fluid overload.  She was given furosemide 40 mg IV x1 with improvement.  CT of the chest confirmed right upper lobe consolidation extending into the right lower lobe.  CT of the abdomen and pelvis suggested partial small bowel obstruction.  The patient was placed on bowel rest.  General surgery was consulted to assist with management. BSE was requested. 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    Assessment / Plan / Recommendation Clinical Impression  Clinical swallow evaluation completed at bedside with Pt's sister present in room. Pt indicates that she has a long standing history of esophageal dysphagia with need for dilation. Chart review reveals that last EGD with dilation was in February of 2019 with Dr. Gala Romney and was notable for mild Schatzki's ring. Pt had MBSS 09/2015 which was notable for a prominent cricopharyngeus and Barium Swallow 11/2015 which was unremarkable. Pt currently with hard, distended belly due to ileus. Oral motor examination is WNL and notable for Pt only having one tooth. Pt assessed with self presentations of water via cup/straw, puree, and graham crackers in upright position. No overt signs or symptoms of aspiration/decreased airway protection noted. Pt with emesis after admission and then worsening PNA.   Pt reports occasional coughing and globus sensation for the past several years and has been worked up extensively, however unsure if cricopharyngeal bar has been addressed. This certainly can decrease bolus flow and may result in pharyngeal residue compounded by h/o Schatzki's ring s/p dilation last February. Pt endorses improvement following dilation, however found  it to be short lived.   Ok to advance to D3/mechanical soft due to edentulous status and thin liquids once cleared by MD due to ileus. SLP can complete MBSS if MD feels that it would add to clinical picture, however ileus would need to be addressed first. SLP will follow during acute stay. Above to RN and recommended that Pt takd large pills whole in puree to faciliate clearance throught UES.    SLP Visit Diagnosis: Dysphagia, unspecified (R13.10)    Aspiration Risk  Mild aspiration risk    Diet Recommendation Dysphagia 3 (Mech soft);Thin liquid   Liquid Administration via: Cup;Straw Medication Administration: Whole meds with puree Supervision: Patient able to self feed Postural Changes: Seated  upright at 90 degrees;Remain upright for at least 30 minutes after po intake    Other  Recommendations Oral Care Recommendations: Oral care BID Other Recommendations: Clarify dietary restrictions   Follow up Recommendations None      Frequency and Duration min 2x/week  1 week       Prognosis Prognosis for Safe Diet Advancement: Good      Swallow Study   General Date of Onset: 12/10/17 HPI: 57 y.o. female with medical history of hypertension, hyperlipidemia, constipation, left lung cancer 1997 status post wedge resection presenting with 4-day history of generalized weakness, shortness of breath, dizziness, and decreased oral intake.  The patient had been in her usual health until 12/06/2017 when she complained of the above symptoms.  Patient's had associated subjective fevers and chills and nonproductive cough.  She had some nausea but denied any vomiting or diarrhea.  The patient took 2 Aleve on 12/06/2017, but has not taken any NSAIDs since then.  She denies any other new medications.  On the evening of 12/09/2017,, the patient sustained a mechanical fall secondary to her worsening generalized weakness.  The patient stated that she was on her way to the bathroom on her legs gave out.  She gives a history of increasing falls in the past week secondary to her generalized weakness.  She denies any syncope.  The patient has been complaining of a sore throat but denies any headache or neck pain.  She is also been complaining of upper abdominal tenderness in the epigastric and left upper quadrant and right upper quadrant areas that began on 12/05/2017.  The patient did have one episode of emesis in the emergency department.  Prior to this episode, the patient had denied any vomiting.  The patient was admitted for treatment of community acquired pneumonia noted on chest x-ray with right upper lobe consolidation.  On the evening of 12/10/2017, the patient had respiratory distress secondary to fluid  overload.  She was given furosemide 40 mg IV x1 with improvement.  CT of the chest confirmed right upper lobe consolidation extending into the right lower lobe.  CT of the abdomen and pelvis suggested partial small bowel obstruction.  The patient was placed on bowel rest.  General surgery was consulted to assist with management. BSE was requested. 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 Type of Study: Bedside Swallow Evaluation Previous Swallow Assessment: MBSS 2017 notable for prominent cricopharyngeus Diet Prior to this Study: (Pt on full liquids) Temperature Spikes Noted: No Respiratory Status: Room air;Nasal cannula History of Recent Intubation: No Behavior/Cognition: Alert;Cooperative;Pleasant mood Oral Cavity Assessment: Within Functional Limits Oral Care Completed by SLP: No Oral Cavity - Dentition: Edentulous(Pt  has one tooth) Vision: Functional for self-feeding Self-Feeding Abilities: Able to feed self Patient Positioning: Upright in bed Baseline Vocal Quality: Normal Volitional Cough: Strong;Congested Volitional Swallow: Able to elicit    Oral/Motor/Sensory Function Overall Oral Motor/Sensory Function: Within functional limits   Ice Chips Ice chips: Not tested   Thin Liquid Thin Liquid: Within functional limits Presentation: Cup;Self Fed;Straw    Nectar Thick Nectar Thick Liquid: Not tested   Honey Thick Honey Thick Liquid: Not tested   Puree Puree: Within functional limits Presentation: Self Fed;Spoon   Solid     Solid: Impaired Presentation: Self Fed Oral Phase Impairments: Impaired mastication(due to edentulous status) Oral Phase Functional Implications: Impaired mastication;Prolonged oral transit     Thank you,  Genene Churn, [place]  Abena Erdman 12/12/2017,12:02 PM

## 2017-12-12 NOTE — Progress Notes (Signed)
Subjective: Did have some upper abdominal pain after eating fish yesterday.  No nausea or vomiting noted.  No bowel movement or flatus yet.  Objective: Vital signs in last 24 hours: Temp:  [98.3 F (36.8 C)-99.1 F (37.3 C)] 98.5 F (36.9 C) (11/25 0829) Pulse Rate:  [103-117] 108 (11/25 0700) Resp:  [17-31] 30 (11/25 0700) BP: (113-143)/(54-83) 143/72 (11/25 0700) SpO2:  [92 %-100 %] 94 % (11/25 0850) Weight:  [81.5 kg] 81.5 kg (11/25 0509) Last BM Date: 12/06/17  Intake/Output from previous day: 11/24 0701 - 11/25 0700 In: 1405.1 [P.O.:120; I.V.:362.1; IV Piggyback:923.1] Out: -  Intake/Output this shift: No intake/output data recorded.  General appearance: alert, cooperative and no distress GI: Soft, slightly distended.  No bowel sounds appreciated.  No rigidity noted.  No specific point tenderness noted.  Lab Results:  Recent Labs    12/11/17 0432 12/12/17 0439  WBC 6.1 6.4  HGB 10.3* 9.2*  HCT 31.0* 28.2*  PLT 204 204   BMET Recent Labs    12/11/17 0432 12/12/17 0439  NA 137 139  K 3.0* 3.4*  CL 103 106  CO2 22 26  GLUCOSE 173* 136*  BUN 15 13  CREATININE 0.95 0.82  CALCIUM 7.3* 7.6*   PT/INR Recent Labs    12/10/17 0730  LABPROT 17.7*  INR 1.48    Studies/Results: Ct Abdomen Pelvis Wo Contrast  Result Date: 12/10/2017 CLINICAL DATA:  57 year old female with history of increasing shortness of breath. Hypoxia. Generalized abdominal pain. Admitted to the hospital for sepsis. EXAM: CT CHEST, ABDOMEN AND PELVIS WITHOUT CONTRAST TECHNIQUE: Multidetector CT imaging of the chest, abdomen and pelvis was performed following the standard protocol without IV contrast. COMPARISON:  CT the abdomen and pelvis 02/15/2017. Chest CT 12/02/2008. FINDINGS: CT CHEST FINDINGS Cardiovascular: Heart size is normal. There is no significant pericardial fluid, thickening or pericardial calcification. Aortic atherosclerosis. No coronary artery calcifications are noted.  Mediastinum/Nodes: Lymphadenopathy in the right paratracheal nodal stations measuring up to 16 mm in the high right paratracheal nodal station. Esophagus is unremarkable in appearance. No axillary lymphadenopathy. Lungs/Pleura: Extensive airspace consolidation throughout the right upper lobe, as well as a small portion of the superior segment of the right lower lobe. Areas of mild linear scarring in the periphery of the left lung. Trace right pleural effusion. No left pleural effusion. No definite suspicious appearing pulmonary nodules. Musculoskeletal: There are no aggressive appearing lytic or blastic lesions noted in the visualized portions of the skeleton. CT ABDOMEN PELVIS FINDINGS Hepatobiliary: Diffuse low attenuation throughout the hepatic parenchyma, indicative of hepatic steatosis. No definite suspicious cystic or solid hepatic lesions are noted on today's noncontrast CT examination. Status post cholecystectomy. Pancreas: No definite pancreatic mass or peripancreatic fluid or inflammatory changes are noted on today's noncontrast CT examination. Spleen: Unremarkable. Adrenals/Urinary Tract: Unenhanced appearance of both kidneys and adrenal glands is normal. No hydroureteronephrosis. Urinary bladder is normal in appearance. Stomach/Bowel: Stomach is normal in appearance. Severe dilatation of the small bowel measuring up to 5.5 cm in diameter, with multiple air-fluid levels. No definite transition point identified. Oral contrast material administered does extend into the colon to the level of the hepatic flexure. Distal colon is relatively decompressed. Normal appendix. Vascular/Lymphatic: Aortic atherosclerosis. No lymphadenopathy noted in the abdomen or pelvis. Reproductive: Status post hysterectomy. Ovaries are not confidently identified may be surgically absent or atrophic. Other: No significant volume of ascites.  No pneumoperitoneum. Musculoskeletal: Status post PLIF at L3-L4 with interbody graft at  L3-L4 interspace. There are  no aggressive appearing lytic or blastic lesions noted in the visualized portions of the skeleton. IMPRESSION: 1. Extensive airspace consolidation in the right lung, most confluent throughout the right upper lobe, most compatible with pneumonia. No definite centrally obstructing lesion identified at this time. Lymphadenopathy in the mediastinum is favored to be reactive. Follow-up chest radiographs are recommended to ensure complete resolution of these findings. 2. Severe dilatation of the mid small bowel, with findings concerning for partial small bowel obstruction. 3. Trace right pleural effusion lying dependently. 4. Aortic atherosclerosis. Electronically Signed   By: Vinnie Langton M.D.   On: 12/10/2017 19:53   Ct Chest Wo Contrast  Result Date: 12/10/2017 CLINICAL DATA:  57 year old female with history of increasing shortness of breath. Hypoxia. Generalized abdominal pain. Admitted to the hospital for sepsis. EXAM: CT CHEST, ABDOMEN AND PELVIS WITHOUT CONTRAST TECHNIQUE: Multidetector CT imaging of the chest, abdomen and pelvis was performed following the standard protocol without IV contrast. COMPARISON:  CT the abdomen and pelvis 02/15/2017. Chest CT 12/02/2008. FINDINGS: CT CHEST FINDINGS Cardiovascular: Heart size is normal. There is no significant pericardial fluid, thickening or pericardial calcification. Aortic atherosclerosis. No coronary artery calcifications are noted. Mediastinum/Nodes: Lymphadenopathy in the right paratracheal nodal stations measuring up to 16 mm in the high right paratracheal nodal station. Esophagus is unremarkable in appearance. No axillary lymphadenopathy. Lungs/Pleura: Extensive airspace consolidation throughout the right upper lobe, as well as a small portion of the superior segment of the right lower lobe. Areas of mild linear scarring in the periphery of the left lung. Trace right pleural effusion. No left pleural effusion. No definite  suspicious appearing pulmonary nodules. Musculoskeletal: There are no aggressive appearing lytic or blastic lesions noted in the visualized portions of the skeleton. CT ABDOMEN PELVIS FINDINGS Hepatobiliary: Diffuse low attenuation throughout the hepatic parenchyma, indicative of hepatic steatosis. No definite suspicious cystic or solid hepatic lesions are noted on today's noncontrast CT examination. Status post cholecystectomy. Pancreas: No definite pancreatic mass or peripancreatic fluid or inflammatory changes are noted on today's noncontrast CT examination. Spleen: Unremarkable. Adrenals/Urinary Tract: Unenhanced appearance of both kidneys and adrenal glands is normal. No hydroureteronephrosis. Urinary bladder is normal in appearance. Stomach/Bowel: Stomach is normal in appearance. Severe dilatation of the small bowel measuring up to 5.5 cm in diameter, with multiple air-fluid levels. No definite transition point identified. Oral contrast material administered does extend into the colon to the level of the hepatic flexure. Distal colon is relatively decompressed. Normal appendix. Vascular/Lymphatic: Aortic atherosclerosis. No lymphadenopathy noted in the abdomen or pelvis. Reproductive: Status post hysterectomy. Ovaries are not confidently identified may be surgically absent or atrophic. Other: No significant volume of ascites.  No pneumoperitoneum. Musculoskeletal: Status post PLIF at L3-L4 with interbody graft at L3-L4 interspace. There are no aggressive appearing lytic or blastic lesions noted in the visualized portions of the skeleton. IMPRESSION: 1. Extensive airspace consolidation in the right lung, most confluent throughout the right upper lobe, most compatible with pneumonia. No definite centrally obstructing lesion identified at this time. Lymphadenopathy in the mediastinum is favored to be reactive. Follow-up chest radiographs are recommended to ensure complete resolution of these findings. 2. Severe  dilatation of the mid small bowel, with findings concerning for partial small bowel obstruction. 3. Trace right pleural effusion lying dependently. 4. Aortic atherosclerosis. Electronically Signed   By: Vinnie Langton M.D.   On: 12/10/2017 19:53   Dg Chest Port 1 View  Result Date: 12/10/2017 CLINICAL DATA:  Respiratory distress EXAM: PORTABLE CHEST 1 VIEW  COMPARISON:  12/10/2017. FINDINGS: Increased dense RIGHT UPPER lung consolidation noted. Cardiomediastinal silhouette is otherwise unremarkable. LEFT lung is clear. No pleural effusion or pneumothorax. IMPRESSION: Increasing dense RIGHT UPPER lung consolidation. Electronically Signed   By: Margarette Canada M.D.   On: 12/10/2017 16:19   US Abdomen Limited Ruq  Result Date: 12/11/2017 CLINICAL DATA:  Hyperbilirubinemia, history GERD, hypertension, hyperlipidemia, former smoker EXAM: ULTRASOUND ABDOMEN LIMITED RIGHT UPPER QUADRANT COMPARISON:  CT abdomen and pelvis 12/10/2017 FINDINGS: Gallbladder: Surgically absent Common bile duct: Diameter: 4 mm diameter, normal Liver: Normal morphology. Smooth contours without mass or nodularity. Slightly heterogeneous echogenicity which could represent subtle geographic fatty infiltration. Portal vein is patent on color Doppler imaging with normal direction of blood flow towards the liver. No RIGHT upper quadrant free fluid. IMPRESSION: Mildly heterogeneous fatty parenchyma which could reflect areas of mild geographic fatty infiltration. No discrete hepatic mass or nodularity seen. Post cholecystectomy without biliary dilatation. Electronically Signed   By: Lavonia Dana M.D.   On: 12/11/2017 10:59    Anti-infectives: Anti-infectives (From admission, onward)   Start     Dose/Rate Route Frequency Ordered Stop   12/11/17 1000  azithromycin (ZITHROMAX) 500 mg in sodium chloride 0.9 % 250 mL IVPB     500 mg 250 mL/hr over 60 Minutes Intravenous Every 24 hours 12/10/17 1129     12/11/17 0900  cefTRIAXone (ROCEPHIN) 2 g  in sodium chloride 0.9 % 100 mL IVPB     2 g 200 mL/hr over 30 Minutes Intravenous Every 24 hours 12/10/17 1129     12/10/17 1330  cefTRIAXone (ROCEPHIN) 1 g in sodium chloride 0.9 % 100 mL IVPB    Note to Pharmacy:  Please give in addition to the one gram given earlier in ED   1 g 200 mL/hr over 30 Minutes Intravenous  Once 12/10/17 1129 12/10/17 1450   12/10/17 0830  cefTRIAXone (ROCEPHIN) 1 g in sodium chloride 0.9 % 100 mL IVPB     1 g 200 mL/hr over 30 Minutes Intravenous  Once 12/10/17 0818 12/10/17 0930   12/10/17 0830  azithromycin (ZITHROMAX) 500 mg in sodium chloride 0.9 % 250 mL IVPB     500 mg 250 mL/hr over 60 Minutes Intravenous  Once 12/10/17 0818 12/10/17 1030      Assessment/Plan: Impression: Small bowel obstruction, most likely ileus secondary to pneumonia. Plan: Continue liquid diet.  Will give cathartics.  LOS: 2 days    Aviva Signs 12/12/2017

## 2017-12-12 NOTE — Progress Notes (Signed)
PROGRESS NOTE  [name] JEH:[id number] DOB: Dec 12, 1960 DOA: 12/10/2017 PCP: Guadalupe Dawn, MD Brief History:  57 y.o.femalewith medical history ofhypertension, hyperlipidemia, constipation, left lung cancer 1997 status post wedge resection presenting with 4-day history of generalized weakness, shortness of breath, dizziness, and decreased oral intake. The patient had been in her usual health until 12/06/2017 when she complained of the above symptoms. Patient's had associated subjective fevers and chills and nonproductive cough. She had some nausea but denied any vomiting or diarrhea. The patient took 2 Aleve on 12/06/2017, but has not taken any NSAIDs since then. She denies any other new medications. On the evening of 12/09/2017,, the patient sustained a mechanical fall secondary to her worsening generalized weakness. The patient stated that she was on her way to the bathroom on her legs gave out. She gives a history of increasing falls in the past week secondary to her generalized weakness. She denies any syncope. The patient has been complaining of a sore throat but denies any headache or neck pain. She is also been complaining of upper abdominal tenderness in the epigastric and left upper quadrant and right upper quadrant areas that began on 12/05/2017. The patient did have one episode of emesis in the emergency department.  Prior to this episode, the patient had denied any vomiting.  The patient was admitted for treatment of community acquired pneumonia noted on chest x-ray with right upper lobe consolidation.  On the evening of 12/10/2017, the patient had respiratory distress secondary to fluid overload.  She was given furosemide 40 mg IV x1 with improvement.  CT of the chest confirmed right upper lobe consolidation extending into the right lower lobe.  CT of the abdomen and pelvis suggested partial small bowel obstruction.  The patient was placed on bowel rest.  General  surgery was consulted to assist with management.  Assessment/Plan: Sepsis -Secondary to pneumonia -Lactic acid 5.6 -The patient received the appropriate fluid boluses in the emergency department -restart IV fluids at lower rate -Follow blood cultures--neg -UA--no significant pyuria -Check procalcitonin 47.36>>>0.27 -Continue ceftriaxone and azithromycin  Lobar pneumonia -Urine Legionella antigen--pending -Urine Streptococcus pneumoniae antigen-POSITIVE -12/10/2017 CT chest--right upper lobe consolidation extending to the right lower lobe.  Trace right pleural effusion.  Right paratracheal adenopathy -Continue duo nebs -Viral respiratory panel--neg  Partial small bowel obstruction -12/10/2017 CT abdomen--small bowel dilatation up to 5.5 cm with air-fluid levels, but no transition point.  Contrast extends into the colon and hepatic flexure.  Status post cholecystectomy and hysterectomy. -General surgery consult -full liquids per general surgery  COPD exacerbation -Patient continues to have some wheezing on exam -Continue IV Solu-Medrol -Continue Pulmicort -Continue duo nebs  AKI -Secondary to sepsis and volume depletion -Baseline creatinine 0.8-0.9 -Serum creatinine peaked 1.57 -Continue IV fluids -Check CPK--112 -Discontinue meloxicam  Sinus tachycardia -Stressed induced from the patient's acute medical condition -Hold additional beta-blockers for now unless the patient is symptomatic or hemodynamically unstable  Hyperbilirubinemia -Fractionate bilirubin--more direct -CT abdomen pelvis--as noted above -RUQ ultrasound--liver steatosis; no ductal dilatation s/p cholecystectomy -trending down; ?congestion due to sepsis  Essential hypertension -Holding amlodipine secondary to soft blood pressure  Chronic pain -Continue home doses of Lyrica and Cymbalta  Hyperlipidemia -Continue statin  GERD/Schatzki's ring -Continue  PPI  Hypokalemia/hypomagnesemia/hypophosphatemia -Replete  Hyperglycemia -Hemoglobin A1c--5.5 -Likely stress-induced    Disposition Plan:   Home in 2-3 days  Family Communication: Family at bedside-11/25  Consultants:  none  Code Status:  FULL  DVT Prophylaxis:  [place] Lovenox  Total time spent 35 minutes.  Greater than 50% spent face to face counseling and coordinating care.     Procedures: As Listed in Progress Note Above  Antibiotics: Ceftriaxone 11/23>>> Azithromycin 11/23>>>    Subjective: Pt feeling weak, but breathing better.  Still has nonproductive cough.  Not passing flatus or had BM.  No n/v/d.  abd pain is a little better.  No headache.  No dysuria.    Objective: Vitals:   12/12/17 0900 12/12/17 1000 12/12/17 1100 12/12/17 1156  BP: 137/68 129/68 (!) 145/67   Pulse: (!) 109 (!) 111 (!) 113   Resp: 18 (!) 24 13   Temp:    98.5 F (36.9 C)  TempSrc:    Oral  SpO2: 94% 97% (!) 88%   Weight:      Height:        Intake/Output Summary (Last 24 hours) at 12/12/2017 1259 Last data filed at 12/11/2017 2114 Gross per 24 hour  Intake 1069.84 ml  Output -  Net 1069.84 ml   Weight change: 4.389 kg Exam:   General:  Pt is alert, follows commands appropriately, not in acute distress  HEENT: No icterus, No thrush, No neck mass, [place]/AT  Cardiovascular: RRR, S1/S2, no rubs, no gallops  Respiratory: bilateral rales.  Bibasilar wheeze.  Diminished breath sounds on right.  Abdomen: Soft/+BS, non tender, non distended, no guarding  Extremities: No edema, No lymphangitis, No petechiae, No rashes, no synovitis   Data Reviewed: I have personally reviewed following labs and imaging studies Basic Metabolic Panel: Recent Labs  Lab 12/10/17 0730 12/10/17 0749 12/11/17 0432 12/12/17 0439  NA 133*  --  137 139  K 3.2*  --  3.0* 3.4*  CL 97*  --  103 106  CO2 20*  --  22 26  GLUCOSE 133*  --  173* 136*  BUN 24*  --  15 13  CREATININE  1.57*  --  0.95 0.82  CALCIUM 8.2*  --  7.3* 7.6*  MG  --  1.2* 1.8 2.6*  PHOS  --   --  1.9* 1.7*   Liver Function Tests: Recent Labs  Lab 12/10/17 0730 12/10/17 0749 12/11/17 0432 12/12/17 0439  AST 25  --  17 24  ALT 17  --  14 18  ALKPHOS 131*  --  87 86  BILITOT 5.8* 4.7* 3.1* 1.6*  PROT 7.9  --  6.8 6.7  ALBUMIN 3.4*  --  2.8* 2.6*   Recent Labs  Lab 12/10/17 0730  LIPASE 17   No results for input(s): AMMONIA in the last 168 hours. Coagulation Profile: Recent Labs  Lab 12/10/17 0730  INR 1.48   CBC: Recent Labs  Lab 12/10/17 0730 12/11/17 0432 12/12/17 0439  WBC 5.5 6.1 6.4  NEUTROABS 4.6  --   --   HGB 13.0 10.3* 9.2*  HCT 39.1 31.0* 28.2*  MCV 90.1 91.4 90.4  PLT 211 204 204   Cardiac Enzymes: Recent Labs  Lab 12/10/17 0730 12/10/17 0749  CKTOTAL  --  112  TROPONINI <0.03  --    BNP: Invalid input(s): POCBNP CBG: No results for input(s): GLUCAP in the last 168 hours. HbA1C: Recent Labs    12/10/17 0749  HGBA1C 5.5   Urine analysis:    Component Value Date/Time   COLORURINE AMBER (A) 12/10/2017 0945   APPEARANCEUR CLOUDY (A) 12/10/2017 0945   LABSPEC 1.018 12/10/2017 0945   PHURINE 5.0 12/10/2017 0945  GLUCOSEU NEGATIVE 12/10/2017 0945   HGBUR MODERATE (A) 12/10/2017 0945   HGBUR trace-intact 03/09/2007 1414   BILIRUBINUR SMALL (A) 12/10/2017 0945   BILIRUBINUR negative 04/14/2016 0919   BILIRUBINUR neg 09/27/2012 0850   KETONESUR NEGATIVE 12/10/2017 0945   PROTEINUR 30 (A) 12/10/2017 0945   UROBILINOGEN 4.0 (A) 04/14/2016 0919   UROBILINOGEN 0.2 08/05/2013 1025   NITRITE NEGATIVE 12/10/2017 0945   LEUKOCYTESUR NEGATIVE 12/10/2017 0945   Sepsis Labs: @LABRCNTIP (procalcitonin:4,lacticidven:4) ) Recent Results (from the past 240 hour(s))  Culture, blood (routine x 2)     Status: None (Preliminary result)   Collection Time: 12/10/17  8:33 AM  Result Value Ref Range Status   Specimen Description LEFT ANTECUBITAL  Final    Special Requests   Final    BOTTLES DRAWN AEROBIC AND ANAEROBIC Blood Culture adequate volume   Culture   Final    NO GROWTH 2 DAYS Performed at [facility], [address], [place], [place] [address]    Report Status PENDING  Incomplete  Culture, blood (routine x 2)     Status: None (Preliminary result)   Collection Time: 12/10/17  8:40 AM  Result Value Ref Range Status   Specimen Description BLOOD LEFT HAND  Final   Special Requests   Final    BOTTLES DRAWN AEROBIC AND ANAEROBIC Blood Culture adequate volume   Culture   Final    NO GROWTH 2 DAYS Performed at [facility], [address] [place], [place] [address]    Report Status PENDING  Incomplete  Culture, Urine     Status: Abnormal   Collection Time: 12/10/17  9:45 AM  Result Value Ref Range Status   Specimen Description   Final    URINE, CLEAN CATCH Performed at [facility], [address] [place], [place] [address]    Special Requests   Final    NONE Performed at [facility], [address], [place], [place] [address]    Culture MULTIPLE SPECIES PRESENT, SUGGEST RECOLLECTION (A)  Final   Report Status 12/12/2017 FINAL  Final  MRSA PCR Screening     Status: None   Collection Time: 12/10/17 10:56 AM  Result Value Ref Range Status   MRSA by PCR NEGATIVE NEGATIVE Final    Comment:        The GeneXpert MRSA Assay (FDA approved for NASAL specimens only), is one component of a comprehensive MRSA colonization surveillance program. It is not intended to diagnose MRSA infection nor to guide or monitor treatment for MRSA infections. Performed at [facility], [address], [place], [place] [address]   Respiratory Panel by PCR     Status: None   Collection Time: 12/10/17 10:56 AM  Result Value Ref Range Status   Adenovirus NOT DETECTED NOT DETECTED Final   Coronavirus 229E NOT DETECTED NOT DETECTED Final   Coronavirus HKU1 NOT DETECTED NOT DETECTED Final   Coronavirus NL63 NOT DETECTED NOT DETECTED Final    Coronavirus OC43 NOT DETECTED NOT DETECTED Final   Metapneumovirus NOT DETECTED NOT DETECTED Final   Rhinovirus / Enterovirus NOT DETECTED NOT DETECTED Final   Influenza A NOT DETECTED NOT DETECTED Final   Influenza B NOT DETECTED NOT DETECTED Final   Parainfluenza Virus 1 NOT DETECTED NOT DETECTED Final   Parainfluenza Virus 2 NOT DETECTED NOT DETECTED Final   Parainfluenza Virus 3 NOT DETECTED NOT DETECTED Final   Parainfluenza Virus 4 NOT DETECTED NOT DETECTED Final   Respiratory Syncytial Virus NOT DETECTED NOT DETECTED Final  Bordetella pertussis NOT DETECTED NOT DETECTED Final   Chlamydophila pneumoniae NOT DETECTED NOT DETECTED Final   Mycoplasma pneumoniae NOT DETECTED NOT DETECTED Final    Comment: Performed at [facility], [place] [address], [place], [place] [address]     Scheduled Meds: . amitriptyline  150 mg Oral QHS  . atorvastatin  40 mg Oral Daily  . bisacodyl  10 mg Rectal BID  . DULoxetine  60 mg Oral BID  . enoxaparin (LOVENOX) injection  40 mg Subcutaneous Q24H  . ipratropium-albuterol  3 mL Nebulization Q6H WA  . lubiprostone  24 mcg Oral BID WC  . magnesium hydroxide  30 mL Oral BID  . methylPREDNISolone (SOLU-MEDROL) injection  60 mg Intravenous Daily  . pantoprazole  40 mg Oral Daily  . pregabalin  100 mg Oral TID  . sodium chloride flush  3 mL Intravenous Q12H  . sodium chloride flush  3 mL Intravenous Q12H   Continuous Infusions: . sodium chloride    . 0.9 % NaCl with KCl 20 mEq / L 50 mL/hr at 12/12/17 0619  . azithromycin 500 mg (12/12/17 1148)  . cefTRIAXone (ROCEPHIN)  IV 2 g (12/12/17 1031)    Procedures/Studies: Ct Abdomen Pelvis Wo Contrast  Result Date: 12/10/2017 CLINICAL DATA:  57 year old female with history of increasing shortness of breath. Hypoxia. Generalized abdominal pain. Admitted to the hospital for sepsis. EXAM: CT CHEST, ABDOMEN AND PELVIS WITHOUT CONTRAST TECHNIQUE: Multidetector CT imaging of the chest, abdomen and pelvis  was performed following the standard protocol without IV contrast. COMPARISON:  CT the abdomen and pelvis 02/15/2017. Chest CT 12/02/2008. FINDINGS: CT CHEST FINDINGS Cardiovascular: Heart size is normal. There is no significant pericardial fluid, thickening or pericardial calcification. Aortic atherosclerosis. No coronary artery calcifications are noted. Mediastinum/Nodes: Lymphadenopathy in the right paratracheal nodal stations measuring up to 16 mm in the high right paratracheal nodal station. Esophagus is unremarkable in appearance. No axillary lymphadenopathy. Lungs/Pleura: Extensive airspace consolidation throughout the right upper lobe, as well as a small portion of the superior segment of the right lower lobe. Areas of mild linear scarring in the periphery of the left lung. Trace right pleural effusion. No left pleural effusion. No definite suspicious appearing pulmonary nodules. Musculoskeletal: There are no aggressive appearing lytic or blastic lesions noted in the visualized portions of the skeleton. CT ABDOMEN PELVIS FINDINGS Hepatobiliary: Diffuse low attenuation throughout the hepatic parenchyma, indicative of hepatic steatosis. No definite suspicious cystic or solid hepatic lesions are noted on today's noncontrast CT examination. Status post cholecystectomy. Pancreas: No definite pancreatic mass or peripancreatic fluid or inflammatory changes are noted on today's noncontrast CT examination. Spleen: Unremarkable. Adrenals/Urinary Tract: Unenhanced appearance of both kidneys and adrenal glands is normal. No hydroureteronephrosis. Urinary bladder is normal in appearance. Stomach/Bowel: Stomach is normal in appearance. Severe dilatation of the small bowel measuring up to 5.5 cm in diameter, with multiple air-fluid levels. No definite transition point identified. Oral contrast material administered does extend into the colon to the level of the hepatic flexure. Distal colon is relatively decompressed.  Normal appendix. Vascular/Lymphatic: Aortic atherosclerosis. No lymphadenopathy noted in the abdomen or pelvis. Reproductive: Status post hysterectomy. Ovaries are not confidently identified may be surgically absent or atrophic. Other: No significant volume of ascites.  No pneumoperitoneum. Musculoskeletal: Status post PLIF at L3-L4 with interbody graft at L3-L4 interspace. There are no aggressive appearing lytic or blastic lesions noted in the visualized portions of the skeleton. IMPRESSION: 1. Extensive airspace consolidation in the right lung,  most confluent throughout the right upper lobe, most compatible with pneumonia. No definite centrally obstructing lesion identified at this time. Lymphadenopathy in the mediastinum is favored to be reactive. Follow-up chest radiographs are recommended to ensure complete resolution of these findings. 2. Severe dilatation of the mid small bowel, with findings concerning for partial small bowel obstruction. 3. Trace right pleural effusion lying dependently. 4. Aortic atherosclerosis. Electronically Signed   By: Vinnie Langton M.D.   On: 12/10/2017 19:53   Dg Chest 2 View  Result Date: 12/10/2017 CLINICAL DATA:  Acute shortness of breath. History of LEFT lung cancer. EXAM: CHEST - 2 VIEW COMPARISON:  02/06/2017 chest radiograph and prior studies FINDINGS: A moderate to large area of RIGHT UPPER lobe consolidation noted. The LEFT lung is clear. Surgical clips in the LEFT hilum again noted. No pleural effusion or pneumothorax. Cardiomediastinal silhouette is otherwise unremarkable. No acute bony abnormalities identified. IMPRESSION: RIGHT UPPER lobe consolidation. If there is strong clinical suspicion for pneumonia, short-term radiographic follow-up following appropriate therapy recommended. Otherwise chest CT with contrast is recommended. Electronically Signed   By: Margarette Canada M.D.   On: 12/10/2017 08:21   Ct Chest Wo Contrast  Result Date: 12/10/2017 CLINICAL DATA:   57 year old female with history of increasing shortness of breath. Hypoxia. Generalized abdominal pain. Admitted to the hospital for sepsis. EXAM: CT CHEST, ABDOMEN AND PELVIS WITHOUT CONTRAST TECHNIQUE: Multidetector CT imaging of the chest, abdomen and pelvis was performed following the standard protocol without IV contrast. COMPARISON:  CT the abdomen and pelvis 02/15/2017. Chest CT 12/02/2008. FINDINGS: CT CHEST FINDINGS Cardiovascular: Heart size is normal. There is no significant pericardial fluid, thickening or pericardial calcification. Aortic atherosclerosis. No coronary artery calcifications are noted. Mediastinum/Nodes: Lymphadenopathy in the right paratracheal nodal stations measuring up to 16 mm in the high right paratracheal nodal station. Esophagus is unremarkable in appearance. No axillary lymphadenopathy. Lungs/Pleura: Extensive airspace consolidation throughout the right upper lobe, as well as a small portion of the superior segment of the right lower lobe. Areas of mild linear scarring in the periphery of the left lung. Trace right pleural effusion. No left pleural effusion. No definite suspicious appearing pulmonary nodules. Musculoskeletal: There are no aggressive appearing lytic or blastic lesions noted in the visualized portions of the skeleton. CT ABDOMEN PELVIS FINDINGS Hepatobiliary: Diffuse low attenuation throughout the hepatic parenchyma, indicative of hepatic steatosis. No definite suspicious cystic or solid hepatic lesions are noted on today's noncontrast CT examination. Status post cholecystectomy. Pancreas: No definite pancreatic mass or peripancreatic fluid or inflammatory changes are noted on today's noncontrast CT examination. Spleen: Unremarkable. Adrenals/Urinary Tract: Unenhanced appearance of both kidneys and adrenal glands is normal. No hydroureteronephrosis. Urinary bladder is normal in appearance. Stomach/Bowel: Stomach is normal in appearance. Severe dilatation of the  small bowel measuring up to 5.5 cm in diameter, with multiple air-fluid levels. No definite transition point identified. Oral contrast material administered does extend into the colon to the level of the hepatic flexure. Distal colon is relatively decompressed. Normal appendix. Vascular/Lymphatic: Aortic atherosclerosis. No lymphadenopathy noted in the abdomen or pelvis. Reproductive: Status post hysterectomy. Ovaries are not confidently identified may be surgically absent or atrophic. Other: No significant volume of ascites.  No pneumoperitoneum. Musculoskeletal: Status post PLIF at L3-L4 with interbody graft at L3-L4 interspace. There are no aggressive appearing lytic or blastic lesions noted in the visualized portions of the skeleton. IMPRESSION: 1. Extensive airspace consolidation in the right lung, most confluent throughout the right upper lobe, most compatible with  pneumonia. No definite centrally obstructing lesion identified at this time. Lymphadenopathy in the mediastinum is favored to be reactive. Follow-up chest radiographs are recommended to ensure complete resolution of these findings. 2. Severe dilatation of the mid small bowel, with findings concerning for partial small bowel obstruction. 3. Trace right pleural effusion lying dependently. 4. Aortic atherosclerosis. Electronically Signed   By: Vinnie Langton M.D.   On: 12/10/2017 19:53   Dg Chest Port 1 View  Result Date: 12/10/2017 CLINICAL DATA:  Respiratory distress EXAM: PORTABLE CHEST 1 VIEW COMPARISON:  12/10/2017. FINDINGS: Increased dense RIGHT UPPER lung consolidation noted. Cardiomediastinal silhouette is otherwise unremarkable. LEFT lung is clear. No pleural effusion or pneumothorax. IMPRESSION: Increasing dense RIGHT UPPER lung consolidation. Electronically Signed   By: Margarette Canada M.D.   On: 12/10/2017 16:19   US Abdomen Limited Ruq  Result Date: 12/11/2017 CLINICAL DATA:  Hyperbilirubinemia, history GERD, hypertension,  hyperlipidemia, former smoker EXAM: ULTRASOUND ABDOMEN LIMITED RIGHT UPPER QUADRANT COMPARISON:  CT abdomen and pelvis 12/10/2017 FINDINGS: Gallbladder: Surgically absent Common bile duct: Diameter: 4 mm diameter, normal Liver: Normal morphology. Smooth contours without mass or nodularity. Slightly heterogeneous echogenicity which could represent subtle geographic fatty infiltration. Portal vein is patent on color Doppler imaging with normal direction of blood flow towards the liver. No RIGHT upper quadrant free fluid. IMPRESSION: Mildly heterogeneous fatty parenchyma which could reflect areas of mild geographic fatty infiltration. No discrete hepatic mass or nodularity seen. Post cholecystectomy without biliary dilatation. Electronically Signed   By: Lavonia Dana M.D.   On: 12/11/2017 10:59    Orson Eva, DO  [facility] Pager [phone]  If 7PM-7AM, please contact night-coverage [email or web address] Password [email or web address] 12/12/2017, 12:59 PM   LOS: 2 days

## 2017-12-13 ENCOUNTER — Inpatient Hospital Stay (HOSPITAL_COMMUNITY): Payer: Medicare Other

## 2017-12-13 DIAGNOSIS — J9601 Acute respiratory failure with hypoxia: Secondary | ICD-10-CM

## 2017-12-13 DIAGNOSIS — A419 Sepsis, unspecified organism: Secondary | ICD-10-CM

## 2017-12-13 LAB — COMPREHENSIVE METABOLIC PANEL
ALK PHOS: 103 U/L (ref 38–126)
ALT: 20 U/L (ref 0–44)
AST: 27 U/L (ref 15–41)
Albumin: 2.6 g/dL — ABNORMAL LOW (ref 3.5–5.0)
Anion gap: 8 (ref 5–15)
BUN: 10 mg/dL (ref 6–20)
CO2: 28 mmol/L (ref 22–32)
CREATININE: 0.79 mg/dL (ref 0.44–1.00)
Calcium: 7.9 mg/dL — ABNORMAL LOW (ref 8.9–10.3)
Chloride: 105 mmol/L (ref 98–111)
Glucose, Bld: 109 mg/dL — ABNORMAL HIGH (ref 70–99)
Potassium: 3.7 mmol/L (ref 3.5–5.1)
Sodium: 141 mmol/L (ref 135–145)
Total Bilirubin: 1.2 mg/dL (ref 0.3–1.2)
Total Protein: 6.6 g/dL (ref 6.5–8.1)

## 2017-12-13 LAB — HEPATITIS C ANTIBODY: HCV Ab: <0.1 s/co ratio (ref 0.0–0.9)

## 2017-12-13 LAB — CBC
HCT: 32.1 % — ABNORMAL LOW (ref 36.0–46.0)
Hemoglobin: 10.2 g/dL — ABNORMAL LOW (ref 12.0–15.0)
MCH: 29.8 pg (ref 26.0–34.0)
MCHC: 31.8 g/dL (ref 30.0–36.0)
MCV: 93.9 fL (ref 80.0–100.0)
PLATELETS: 211 10*3/uL (ref 150–400)
RBC: 3.42 MIL/uL — ABNORMAL LOW (ref 3.87–5.11)
RDW: 13.2 % (ref 11.5–15.5)
WBC: 7.7 10*3/uL (ref 4.0–10.5)
nRBC: 0 % (ref 0.0–0.2)

## 2017-12-13 LAB — PROCALCITONIN: PROCALCITONIN: 13.51 ng/mL

## 2017-12-13 LAB — HEPATITIS B SURFACE ANTIGEN: Hepatitis B Surface Ag: NEGATIVE

## 2017-12-13 LAB — BRAIN NATRIURETIC PEPTIDE: B NATRIURETIC PEPTIDE 5: 152 pg/mL — AB (ref 0.0–100.0)

## 2017-12-13 LAB — PHOSPHORUS: PHOSPHORUS: 1.8 mg/dL — AB (ref 2.5–4.6)

## 2017-12-13 MED ORDER — FUROSEMIDE 10 MG/ML IJ SOLN
40.0000 mg | Freq: Once | INTRAMUSCULAR | Status: AC
  Administered 2017-12-13: 40 mg via INTRAVENOUS
  Filled 2017-12-13: qty 4

## 2017-12-13 MED ORDER — AMITRIPTYLINE HCL 25 MG PO TABS
150.0000 mg | ORAL_TABLET | Freq: Every day | ORAL | Status: DC
  Administered 2017-12-13 – 2017-12-14 (×2): 150 mg via ORAL
  Filled 2017-12-13 (×2): qty 6

## 2017-12-13 MED ORDER — FUROSEMIDE 10 MG/ML IJ SOLN: 40.0000 mg | Freq: Once | INTRAMUSCULAR | Status: DC

## 2017-12-13 MED ORDER — METHYLPREDNISOLONE SODIUM SUCC 125 MG IJ SOLR
60.0000 mg | Freq: Two times a day (BID) | INTRAMUSCULAR | Status: DC
  Administered 2017-12-13 – 2017-12-15 (×4): 60 mg via INTRAVENOUS
  Filled 2017-12-13 (×4): qty 2

## 2017-12-13 NOTE — Progress Notes (Addendum)
PROGRESS NOTE  [name] BMS:[id number] DOB: [birth year] DOA: 12/10/2017 PCP: Guadalupe Dawn, MD  Brief History: 57 y.o.femalewith medical history ofhypertension, hyperlipidemia, constipation, left lung cancer 1997 status post wedge resection presenting with 4-day history of generalized weakness, shortness of breath, dizziness, and decreased oral intake. The patient had been in her usual health until 12/06/2017 when she complained of the above symptoms. Patient's had associated subjective fevers and chills and nonproductive cough. She had some nausea but denied any vomiting or diarrhea. The patient took 2 Aleve on 12/06/2017, but has not taken any NSAIDs since then. She denies any other new medications. On the evening of 12/09/2017,, the patient sustained a mechanical fall secondary to her worsening generalized weakness. The patient stated that she was on her way to the bathroom on her legs gave out. She gives a history of increasing falls in the past week secondary to her generalized weakness. She denies any syncope. The patient has been complaining of a sore throat but denies any headache or neck pain. She is also been complaining of upper abdominal tenderness in the epigastric and left upper quadrant and right upper quadrant areasthat began on 12/05/2017. The patient did have one episode of emesis in the emergency department.Prior to this episode, the patient had denied any vomiting. The patient was admitted for treatment of community acquired pneumonia noted on chest x-ray with right upper lobe consolidation. On the evening of 12/10/2017, the patient had respiratory distress secondary to fluid overload. She was given furosemide 40 mg IV x1 with improvement. CT of the chest confirmed right upper lobe consolidation extending into the right lower lobe. CT of the abdomen and pelvis suggested partial small bowel obstruction. The patient was placed on bowel rest. General  surgery was consulted to assist with management.  Assessment/Plan: Sepsis -Secondary to pneumonia -Lactic acid 5.6 -The patient received the appropriate fluid boluses in the emergency department -saline lock IVF -Follow blood cultures--neg -UA--no significant pyuria -Check procalcitonin47.36>>>0.27 -Continue ceftriaxone  Acute respiratory failure with hypoxia -11/26--pt developed respiratory distress after going to bathroom -11/26--personally reviewed CXR-->increase alveolar and interstitial opacity R-lung; increase vascular congestion -give lasix 40 IV x 1 -check BNP -increase solumedrol to bid -now stable on 4L-->94-95% -continue duo neb  Lobar pneumonia -Urine Legionella antigen--neg -Urine Streptococcus pneumoniae antigen-POSITIVE -12/10/2017 CT chest--right upper lobe consolidation extending to the right lower lobe. Trace right pleural effusion. Right paratracheal adenopathy -Continue duo nebs -Viral respiratory panel--neg  Partial small bowel obstruction -12/10/2017 CT abdomen--small bowel dilatation up to 5.5 cm with air-fluid levels, but no transition point. Contrast extends into the colon and hepatic flexure. Status post cholecystectomy and hysterectomy. -General surgery consult -full liquids per general surgery -still having some pain with full liquid but had BM 11/25 -am Abd xray  COPD exacerbation -Patient continues to have some wheezing on exam -Continue IV Solu-Medrol--increase to bid -Continue Pulmicort -Continue duo nebs  AKI -Secondary to sepsis and volume depletion -Baseline creatinine 0.8-0.9 -Serum creatinine peaked 1.57 -Continue IV fluids-->improved -Check CPK--112 -Discontinue meloxicam  Sinus tachycardia -Stressed induced from the patient's acute medical condition  Hyperbilirubinemia -Fractionate bilirubin--more direct -CT abdomen pelvis--as noted above -RUQ ultrasound--liver steatosis; no ductal dilatation s/p  cholecystectomy -trending down; ?congestion due to sepsis  Essential hypertension -Holding amlodipine secondary to soft blood pressure  Chronic pain -Continue home doses of Lyrica and Cymbalta  Hyperlipidemia -Continue statin  GERD/Schatzki's ring -Continue PPI  Hypokalemia/hypomagnesemia/hypophosphatemia -Replete  Hyperglycemia -Hemoglobin A1c--5.5 -Likely stress-induced  Disposition Plan: Not stable for d/c--Home in 2-3days  Family Communication:Family at bedside-11/25  Consultants:none  Code Status: FULL   DVT Prophylaxis: [place] Lovenox  Total time spent 35 minutes.  Greater than 50% spent face to face counseling and coordinating care.     Procedures: As Listed in Progress Note Above  Antibiotics: Ceftriaxone 11/23>>> Azithromycin 11/23>>>11/26     Subjective: Pt developed respiratory distress after walking to bathroom.  Felt more SOB today.  No n/v.  Still having abd pain with full liquids.  Had 2 BM yesterday.  Passing flatus.   Objective: Vitals:   12/13/17 0900 12/13/17 1120 12/13/17 1341 12/13/17 1427  BP:   (!) 165/56   Pulse: (!) 112  (!) 117   Resp: (!) 22  20   Temp:  99.1 F (37.3 C) 99 F (37.2 C)   TempSrc:  Oral Oral   SpO2: 94%  (!) 88% 92%  Weight:      Height:        Intake/Output Summary (Last 24 hours) at 12/13/2017 1505 Last data filed at 12/13/2017 1032 Gross per 24 hour  Intake 1181.35 ml  Output -  Net 1181.35 ml   Weight change: 0.2 kg Exam:   General:  Pt is alert, follows commands appropriately, not in acute distress  HEENT: No icterus, No thrush, No neck mass, [place]/AT  Cardiovascular: RRR, S1/S2, no rubs, no gallops  Respiratory: bilateral crackles, R>L, bibasilar wheeze  Abdomen: Soft/+BS, non tender, non distended, no guarding  Extremities: No edema, No lymphangitis, No petechiae, No rashes, no synovitis   Data Reviewed: I have personally reviewed following labs and  imaging studies Basic Metabolic Panel: Recent Labs  Lab 12/10/17 0730 12/10/17 0749 12/11/17 0432 12/12/17 0439 12/13/17 0405  NA 133*  --  137 139 141  K 3.2*  --  3.0* 3.4* 3.7  CL 97*  --  103 106 105  CO2 20*  --  22 26 28   GLUCOSE 133*  --  173* 136* 109*  BUN 24*  --  15 13 10   CREATININE 1.57*  --  0.95 0.82 0.79  CALCIUM 8.2*  --  7.3* 7.6* 7.9*  MG  --  1.2* 1.8 2.6*  --   PHOS  --   --  1.9* 1.7* 1.8*   Liver Function Tests: Recent Labs  Lab 12/10/17 0730 12/10/17 0749 12/11/17 0432 12/12/17 0439 12/13/17 0405  AST 25  --  17 24 27   ALT 17  --  14 18 20   ALKPHOS 131*  --  87 86 103  BILITOT 5.8* 4.7* 3.1* 1.6* 1.2  PROT 7.9  --  6.8 6.7 6.6  ALBUMIN 3.4*  --  2.8* 2.6* 2.6*   Recent Labs  Lab 12/10/17 0730  LIPASE 17   No results for input(s): AMMONIA in the last 168 hours. Coagulation Profile: Recent Labs  Lab 12/10/17 0730  INR 1.48   CBC: Recent Labs  Lab 12/10/17 0730 12/11/17 0432 12/12/17 0439  WBC 5.5 6.1 6.4  NEUTROABS 4.6  --   --   HGB 13.0 10.3* 9.2*  HCT 39.1 31.0* 28.2*  MCV 90.1 91.4 90.4  PLT 211 204 204   Cardiac Enzymes: Recent Labs  Lab 12/10/17 0730 12/10/17 0749  CKTOTAL  --  112  TROPONINI <0.03  --    BNP: Invalid input(s): POCBNP CBG: No results for input(s): GLUCAP in the last 168 hours. HbA1C: No results for input(s): HGBA1C in the last 72 hours. Urine analysis:  Component Value Date/Time   COLORURINE AMBER (A) 12/10/2017 0945   APPEARANCEUR CLOUDY (A) 12/10/2017 0945   LABSPEC 1.018 12/10/2017 0945   PHURINE 5.0 12/10/2017 0945   GLUCOSEU NEGATIVE 12/10/2017 0945   HGBUR MODERATE (A) 12/10/2017 0945   HGBUR trace-intact 03/09/2007 1414   BILIRUBINUR SMALL (A) 12/10/2017 0945   BILIRUBINUR negative 04/14/2016 0919   BILIRUBINUR neg 09/27/2012 0850   KETONESUR NEGATIVE 12/10/2017 0945   PROTEINUR 30 (A) 12/10/2017 0945   UROBILINOGEN 4.0 (A) 04/14/2016 0919   UROBILINOGEN 0.2 08/05/2013 1025     NITRITE NEGATIVE 12/10/2017 0945   LEUKOCYTESUR NEGATIVE 12/10/2017 0945   Sepsis Labs: @LABRCNTIP (procalcitonin:4,lacticidven:4) ) Recent Results (from the past 240 hour(s))  Culture, blood (routine x 2)     Status: None (Preliminary result)   Collection Time: 12/10/17  8:33 AM  Result Value Ref Range Status   Specimen Description LEFT ANTECUBITAL  Final   Special Requests   Final    BOTTLES DRAWN AEROBIC AND ANAEROBIC Blood Culture adequate volume   Culture   Final    NO GROWTH 3 DAYS Performed at [facility], [address] [place], [place] [address]    Report Status PENDING  Incomplete  Culture, blood (routine x 2)     Status: None (Preliminary result)   Collection Time: 12/10/17  8:40 AM  Result Value Ref Range Status   Specimen Description BLOOD LEFT HAND  Final   Special Requests   Final    BOTTLES DRAWN AEROBIC AND ANAEROBIC Blood Culture adequate volume   Culture   Final    NO GROWTH 3 DAYS Performed at [facility], [address] [place], [place] [address]    Report Status PENDING  Incomplete  Culture, Urine     Status: Abnormal   Collection Time: 12/10/17  9:45 AM  Result Value Ref Range Status   Specimen Description   Final    URINE, CLEAN CATCH Performed at [facility], [address] [place], [place] [address]    Special Requests   Final    NONE Performed at [facility], [address] [place], [place] [address]    Culture MULTIPLE SPECIES PRESENT, SUGGEST RECOLLECTION (A)  Final   Report Status 12/12/2017 FINAL  Final  MRSA PCR Screening     Status: None   Collection Time: 12/10/17 10:56 AM  Result Value Ref Range Status   MRSA by PCR NEGATIVE NEGATIVE Final    Comment:        The GeneXpert MRSA Assay (FDA approved for NASAL specimens only), is one component of a comprehensive MRSA colonization surveillance program. It is not intended to diagnose MRSA infection nor to guide or monitor treatment for MRSA infections. Performed at [facility], [address] [place], [place] [address]   Respiratory Panel by PCR     Status: None   Collection Time: 12/10/17 10:56 AM  Result Value Ref Range Status   Adenovirus NOT DETECTED NOT DETECTED Final   Coronavirus 229E NOT DETECTED NOT DETECTED Final   Coronavirus HKU1 NOT DETECTED NOT DETECTED Final   Coronavirus NL63 NOT DETECTED NOT DETECTED Final   Coronavirus OC43 NOT DETECTED NOT DETECTED Final   Metapneumovirus NOT DETECTED NOT DETECTED Final   Rhinovirus / Enterovirus NOT DETECTED NOT DETECTED Final   Influenza A NOT DETECTED NOT DETECTED Final   Influenza B NOT DETECTED NOT DETECTED Final   Parainfluenza Virus 1 NOT DETECTED NOT DETECTED Final   Parainfluenza Virus 2 NOT DETECTED NOT DETECTED  Final   Parainfluenza Virus 3 NOT DETECTED NOT DETECTED Final   Parainfluenza Virus 4 NOT DETECTED NOT DETECTED Final   Respiratory Syncytial Virus NOT DETECTED NOT DETECTED Final   Bordetella pertussis NOT DETECTED NOT DETECTED Final   Chlamydophila pneumoniae NOT DETECTED NOT DETECTED Final   Mycoplasma pneumoniae NOT DETECTED NOT DETECTED Final    Comment: Performed at [facility], [place] [address], [place], [place] [address]     Scheduled Meds: . amitriptyline  150 mg Oral QHS  . atorvastatin  40 mg Oral Daily  . bisacodyl  10 mg Rectal BID  . budesonide (PULMICORT) nebulizer solution  0.5 mg Nebulization BID  . DULoxetine  60 mg Oral BID  . enoxaparin (LOVENOX) injection  40 mg Subcutaneous Q24H  . furosemide  40 mg Intravenous Once  . ipratropium-albuterol  3 mL Nebulization TID  . lubiprostone  24 mcg Oral BID WC  . magnesium hydroxide  30 mL Oral BID  . methylPREDNISolone (SOLU-MEDROL) injection  60 mg Intravenous Q12H  . pantoprazole  40 mg Oral Daily  . phosphorus  500 mg Oral BID WC  . pregabalin  100 mg Oral TID  . sodium chloride flush  3 mL Intravenous Q12H  . sodium chloride flush  3 mL Intravenous Q12H   Continuous Infusions: . sodium chloride    . 0.9  % NaCl with KCl 20 mEq / L Stopped (12/12/17 1031)  . azithromycin 500 mg (12/13/17 1034)  . cefTRIAXone (ROCEPHIN)  IV 2 g (12/13/17 0839)    Procedures/Studies: Ct Abdomen Pelvis Wo Contrast  Result Date: 12/10/2017 CLINICAL DATA:  57 year old female with history of increasing shortness of breath. Hypoxia. Generalized abdominal pain. Admitted to the hospital for sepsis. EXAM: CT CHEST, ABDOMEN AND PELVIS WITHOUT CONTRAST TECHNIQUE: Multidetector CT imaging of the chest, abdomen and pelvis was performed following the standard protocol without IV contrast. COMPARISON:  CT the abdomen and pelvis 02/15/2017. Chest CT 12/02/2008. FINDINGS: CT CHEST FINDINGS Cardiovascular: Heart size is normal. There is no significant pericardial fluid, thickening or pericardial calcification. Aortic atherosclerosis. No coronary artery calcifications are noted. Mediastinum/Nodes: Lymphadenopathy in the right paratracheal nodal stations measuring up to 16 mm in the high right paratracheal nodal station. Esophagus is unremarkable in appearance. No axillary lymphadenopathy. Lungs/Pleura: Extensive airspace consolidation throughout the right upper lobe, as well as a small portion of the superior segment of the right lower lobe. Areas of mild linear scarring in the periphery of the left lung. Trace right pleural effusion. No left pleural effusion. No definite suspicious appearing pulmonary nodules. Musculoskeletal: There are no aggressive appearing lytic or blastic lesions noted in the visualized portions of the skeleton. CT ABDOMEN PELVIS FINDINGS Hepatobiliary: Diffuse low attenuation throughout the hepatic parenchyma, indicative of hepatic steatosis. No definite suspicious cystic or solid hepatic lesions are noted on today's noncontrast CT examination. Status post cholecystectomy. Pancreas: No definite pancreatic mass or peripancreatic fluid or inflammatory changes are noted on today's noncontrast CT examination. Spleen:  Unremarkable. Adrenals/Urinary Tract: Unenhanced appearance of both kidneys and adrenal glands is normal. No hydroureteronephrosis. Urinary bladder is normal in appearance. Stomach/Bowel: Stomach is normal in appearance. Severe dilatation of the small bowel measuring up to 5.5 cm in diameter, with multiple air-fluid levels. No definite transition point identified. Oral contrast material administered does extend into the colon to the level of the hepatic flexure. Distal colon is relatively decompressed. Normal appendix. Vascular/Lymphatic: Aortic atherosclerosis. No lymphadenopathy noted in the abdomen or pelvis. Reproductive: Status post hysterectomy.  Ovaries are not confidently identified may be surgically absent or atrophic. Other: No significant volume of ascites.  No pneumoperitoneum. Musculoskeletal: Status post PLIF at L3-L4 with interbody graft at L3-L4 interspace. There are no aggressive appearing lytic or blastic lesions noted in the visualized portions of the skeleton. IMPRESSION: 1. Extensive airspace consolidation in the right lung, most confluent throughout the right upper lobe, most compatible with pneumonia. No definite centrally obstructing lesion identified at this time. Lymphadenopathy in the mediastinum is favored to be reactive. Follow-up chest radiographs are recommended to ensure complete resolution of these findings. 2. Severe dilatation of the mid small bowel, with findings concerning for partial small bowel obstruction. 3. Trace right pleural effusion lying dependently. 4. Aortic atherosclerosis. Electronically Signed   By: Vinnie Langton M.D.   On: 12/10/2017 19:53   Dg Chest 2 View  Result Date: 12/10/2017 CLINICAL DATA:  Acute shortness of breath. History of LEFT lung cancer. EXAM: CHEST - 2 VIEW COMPARISON:  02/06/2017 chest radiograph and prior studies FINDINGS: A moderate to large area of RIGHT UPPER lobe consolidation noted. The LEFT lung is clear. Surgical clips in the LEFT  hilum again noted. No pleural effusion or pneumothorax. Cardiomediastinal silhouette is otherwise unremarkable. No acute bony abnormalities identified. IMPRESSION: RIGHT UPPER lobe consolidation. If there is strong clinical suspicion for pneumonia, short-term radiographic follow-up following appropriate therapy recommended. Otherwise chest CT with contrast is recommended. Electronically Signed   By: Margarette Canada M.D.   On: 12/10/2017 08:21   Ct Chest Wo Contrast  Result Date: 12/10/2017 CLINICAL DATA:  57 year old female with history of increasing shortness of breath. Hypoxia. Generalized abdominal pain. Admitted to the hospital for sepsis. EXAM: CT CHEST, ABDOMEN AND PELVIS WITHOUT CONTRAST TECHNIQUE: Multidetector CT imaging of the chest, abdomen and pelvis was performed following the standard protocol without IV contrast. COMPARISON:  CT the abdomen and pelvis 02/15/2017. Chest CT 12/02/2008. FINDINGS: CT CHEST FINDINGS Cardiovascular: Heart size is normal. There is no significant pericardial fluid, thickening or pericardial calcification. Aortic atherosclerosis. No coronary artery calcifications are noted. Mediastinum/Nodes: Lymphadenopathy in the right paratracheal nodal stations measuring up to 16 mm in the high right paratracheal nodal station. Esophagus is unremarkable in appearance. No axillary lymphadenopathy. Lungs/Pleura: Extensive airspace consolidation throughout the right upper lobe, as well as a small portion of the superior segment of the right lower lobe. Areas of mild linear scarring in the periphery of the left lung. Trace right pleural effusion. No left pleural effusion. No definite suspicious appearing pulmonary nodules. Musculoskeletal: There are no aggressive appearing lytic or blastic lesions noted in the visualized portions of the skeleton. CT ABDOMEN PELVIS FINDINGS Hepatobiliary: Diffuse low attenuation throughout the hepatic parenchyma, indicative of hepatic steatosis. No definite  suspicious cystic or solid hepatic lesions are noted on today's noncontrast CT examination. Status post cholecystectomy. Pancreas: No definite pancreatic mass or peripancreatic fluid or inflammatory changes are noted on today's noncontrast CT examination. Spleen: Unremarkable. Adrenals/Urinary Tract: Unenhanced appearance of both kidneys and adrenal glands is normal. No hydroureteronephrosis. Urinary bladder is normal in appearance. Stomach/Bowel: Stomach is normal in appearance. Severe dilatation of the small bowel measuring up to 5.5 cm in diameter, with multiple air-fluid levels. No definite transition point identified. Oral contrast material administered does extend into the colon to the level of the hepatic flexure. Distal colon is relatively decompressed. Normal appendix. Vascular/Lymphatic: Aortic atherosclerosis. No lymphadenopathy noted in the abdomen or pelvis. Reproductive: Status post hysterectomy. Ovaries are not confidently identified may be surgically absent or  atrophic. Other: No significant volume of ascites.  No pneumoperitoneum. Musculoskeletal: Status post PLIF at L3-L4 with interbody graft at L3-L4 interspace. There are no aggressive appearing lytic or blastic lesions noted in the visualized portions of the skeleton. IMPRESSION: 1. Extensive airspace consolidation in the right lung, most confluent throughout the right upper lobe, most compatible with pneumonia. No definite centrally obstructing lesion identified at this time. Lymphadenopathy in the mediastinum is favored to be reactive. Follow-up chest radiographs are recommended to ensure complete resolution of these findings. 2. Severe dilatation of the mid small bowel, with findings concerning for partial small bowel obstruction. 3. Trace right pleural effusion lying dependently. 4. Aortic atherosclerosis. Electronically Signed   By: Vinnie Langton M.D.   On: 12/10/2017 19:53   Dg Chest Port 1 View  Result Date: 12/13/2017 CLINICAL  DATA:  Respiratory distress, shortness of breath EXAM: PORTABLE CHEST 1 VIEW COMPARISON:  CT chest of 12/10/2017 and chest x-ray of the same day FINDINGS: There is worsening of airspace disease throughout the entire right lung, again most consistent with progression of pneumonia. The left lung is clear. The heart is within upper limits normal. No bony abnormality is seen. IMPRESSION: Worsening of airspace disease now throughout the entire right lung most consistent with pneumonia. The left lung is clear. Electronically Signed   By: Ivar Drape M.D.   On: 12/13/2017 14:55   Dg Chest Port 1 View  Result Date: 12/10/2017 CLINICAL DATA:  Respiratory distress EXAM: PORTABLE CHEST 1 VIEW COMPARISON:  12/10/2017. FINDINGS: Increased dense RIGHT UPPER lung consolidation noted. Cardiomediastinal silhouette is otherwise unremarkable. LEFT lung is clear. No pleural effusion or pneumothorax. IMPRESSION: Increasing dense RIGHT UPPER lung consolidation. Electronically Signed   By: Margarette Canada M.D.   On: 12/10/2017 16:19   US Abdomen Limited Ruq  Result Date: 12/11/2017 CLINICAL DATA:  Hyperbilirubinemia, history GERD, hypertension, hyperlipidemia, former smoker EXAM: ULTRASOUND ABDOMEN LIMITED RIGHT UPPER QUADRANT COMPARISON:  CT abdomen and pelvis 12/10/2017 FINDINGS: Gallbladder: Surgically absent Common bile duct: Diameter: 4 mm diameter, normal Liver: Normal morphology. Smooth contours without mass or nodularity. Slightly heterogeneous echogenicity which could represent subtle geographic fatty infiltration. Portal vein is patent on color Doppler imaging with normal direction of blood flow towards the liver. No RIGHT upper quadrant free fluid. IMPRESSION: Mildly heterogeneous fatty parenchyma which could reflect areas of mild geographic fatty infiltration. No discrete hepatic mass or nodularity seen. Post cholecystectomy without biliary dilatation. Electronically Signed   By: Lavonia Dana M.D.   On: 12/11/2017 10:59      Orson Eva, DO  [facility] Pager [phone]  If 7PM-7AM, please contact night-coverage [email or web address] Password [email or web address] 12/13/2017, 3:05 PM   LOS: 3 days

## 2017-12-13 NOTE — Evaluation (Signed)
Physical Therapy Evaluation Patient Details Name: [name] MRN: [id number] DOB: 1960-05-25 Today's Date: 12/13/2017   History of Present Illness  [name] is a 57 y.o. female with medical history of hypertension, hyperlipidemia, constipation, left lung cancer 1997 status post wedge resection presenting with 4-day history of generalized weakness, shortness of breath, dizziness, and decreased oral intake.  The patient had been in her usual health until 12/06/2017 when she complained of the above symptoms.  Patient's had associated subjective fevers and chills and nonproductive cough.  She had some nausea but denied any vomiting or diarrhea.  The patient took 2 Aleve on 12/06/2017, but has not taken any NSAIDs since then.  She denies any other new medications.  On the evening of 12/09/2017,, the patient sustained a mechanical fall secondary to her worsening generalized weakness.  The patient stated that she was on her way to the bathroom on her legs gave out.  She gives a history of increasing falls in the past week secondary to her generalized weakness.  She denies any syncope.  The patient has been complaining of a sore throat but denies any headache or neck pain.  She is also been complaining of upper abdominal tenderness in the epigastric and left upper quadrant and right upper quadrant areas.  The patient did have one episode of emesis in the emergency department, but also states that she feels hungry and wants to eat and drink.  She denies any diarrhea, hematochezia, melena, dysuria, hematuria.    Clinical Impression  Patient functioning near baseline for functional mobility and gait, mostly limited due to SOB and O2 desaturation on exertion.  Patient put on room air with O2 saturation dropping below 85% for bed<>chair transfers, put back on 3 LPM for gait training and O2 saturation dropped below 90% while walking having to slow cadence significantly requiring much time to ambulate back to room.   Patient continued sitting up in chair after therapy.  Patient will benefit from continued physical therapy in hospital and recommended venue below to increase strength, balance, endurance for safe ADLs and gait.     Follow Up Recommendations Home health PT    Equipment Recommendations  Cane(single point cane)    Recommendations for Other Services       Precautions / Restrictions Precautions Precautions: Fall Restrictions Weight Bearing Restrictions: No      Mobility  Bed Mobility Overal bed mobility: Independent                Transfers Overall transfer level: Modified independent               General transfer comment: increased time  Ambulation/Gait Ambulation/Gait assistance: Supervision Gait Distance (Feet): 40 Feet Assistive device: None Gait Pattern/deviations: Decreased step length - right;Decreased step length - left;Decreased stride length Gait velocity: decreased   General Gait Details: slightly labored slow cadence without loss of balance, limited mostly due to SOB  Stairs            Wheelchair Mobility    Modified Rankin (Stroke Patients Only)       Balance Overall balance assessment: No apparent balance deficits (not formally assessed)                                           Pertinent Vitals/Pain Pain Assessment: 0-10 Pain Score: 7  Pain Location: stomach  Pain Descriptors /  Indicators: Aching Pain Intervention(s): Limited activity within patient's tolerance;Monitored during session    [place] expects to be discharged to:: Private residence Living Arrangements: Spouse/significant other Available Help at Discharge: Family Type of Home: House Home Access: Stairs to enter Entrance Stairs-Rails: Right;Left;Can reach both Entrance Stairs-Number of Steps: 3 Home Layout: One level Home Equipment: Environmental consultant - 2 wheels;Bedside commode      Prior Function Level of Independence: Independent          Comments: community ambulator, drives     Hand Dominance   Dominant Hand: Right    Extremity/Trunk Assessment        Lower Extremity Assessment Lower Extremity Assessment: Generalized weakness    Cervical / Trunk Assessment Cervical / Trunk Assessment: Normal  Communication   Communication: No difficulties  Cognition Arousal/Alertness: Awake/alert Behavior During Therapy: WFL for tasks assessed/performed Overall Cognitive Status: Within Functional Limits for tasks assessed                                        General Comments      Exercises     Assessment/Plan    PT Assessment Patient needs continued PT services  PT Problem List Decreased strength;Decreased activity tolerance;Decreased balance;Decreased mobility       PT Treatment Interventions Gait training;Stair training;Functional mobility training;Therapeutic exercise;Therapeutic activities;Patient/family education    PT Goals (Current goals can be found in the Care Plan section)  Acute Rehab PT Goals Patient Stated Goal: return home with family to help (will be staying with her daughter) PT Goal Formulation: With patient Time For Goal Achievement: 12/20/17 Potential to Achieve Goals: Good    Frequency Min 3X/week   Barriers to discharge        Co-evaluation               AM-PAC PT "6 Clicks" Mobility  Outcome Measure Help needed turning from your back to your side while in a flat bed without using bedrails?: None Help needed moving from lying on your back to sitting on the side of a flat bed without using bedrails?: None Help needed moving to and from a bed to a chair (including a wheelchair)?: None Help needed standing up from a chair using your arms (e.g., wheelchair or bedside chair)?: None Help needed to walk in hospital room?: A Little Help needed climbing 3-5 steps with a railing? : A Little 6 Click Score: 22    End of Session Equipment Utilized During  Treatment: Oxygen Activity Tolerance: Patient tolerated treatment well;Patient limited by fatigue(Patient limited by SOB) Patient left: in chair;with call bell/phone within reach Nurse Communication: Mobility status PT Visit Diagnosis: Unsteadiness on feet (R26.81);Other abnormalities of gait and mobility (R26.89);Muscle weakness (generalized) (M62.81)    Time: 9758-8325 PT Time Calculation (min) (ACUTE ONLY): 26 min   Charges:   PT Evaluation $PT Eval Moderate Complexity: 1 Mod PT Treatments $Therapeutic Activity: 23-37 mins        3:10 PM, 12/13/17 Lonell Grandchild, MPT Physical Therapist with [facility] [phone] office [phone] mobile phone

## 2017-12-13 NOTE — Plan of Care (Signed)
  Problem: Acute Rehab PT Goals(only PT should resolve) Goal: Pt Will Ambulate Outcome: Progressing Flowsheets (Taken 12/13/2017 1511) Pt will Ambulate: with modified independence; 75 feet; with cane Note:  With SPC if needed Goal: Pt Will Go Up/Down Stairs Outcome: Progressing Flowsheets (Taken 12/13/2017 1511) Pt will Go Up / Down Stairs: 3-5 stairs; with supervision; with cane; with rail(s) Note:  Use SPC if needed   3:13 PM, 12/13/17 Lonell Grandchild, MPT Physical Therapist with [facility] [phone] office [phone] mobile phone

## 2017-12-13 NOTE — Progress Notes (Signed)
Patient c/o SOB - O2 sats 85% on 3L O2.  Increased to 4L and instructed patient to relax and take slow deep breaths.  sats increased to 93%, maintaining.  RT called and coming to administer breathing tx.  MD made aware

## 2017-12-14 LAB — BASIC METABOLIC PANEL
ANION GAP: 10 (ref 5–15)
BUN: 12 mg/dL (ref 6–20)
CHLORIDE: 101 mmol/L (ref 98–111)
CO2: 30 mmol/L (ref 22–32)
Calcium: 7.9 mg/dL — ABNORMAL LOW (ref 8.9–10.3)
Creatinine, Ser: 0.75 mg/dL (ref 0.44–1.00)
GFR calc Af Amer: >60 mL/min (ref >60)
Glucose, Bld: 120 mg/dL — ABNORMAL HIGH (ref 70–99)
POTASSIUM: 3.7 mmol/L (ref 3.5–5.1)
Sodium: 141 mmol/L (ref 135–145)

## 2017-12-14 LAB — CBC
HEMATOCRIT: 33.8 % — AB (ref 36.0–46.0)
HEMOGLOBIN: 10.8 g/dL — AB (ref 12.0–15.0)
MCH: 29.6 pg (ref 26.0–34.0)
MCHC: 32 g/dL (ref 30.0–36.0)
MCV: 92.6 fL (ref 80.0–100.0)
Platelets: 255 10*3/uL (ref 150–400)
RBC: 3.65 MIL/uL — AB (ref 3.87–5.11)
RDW: 13.1 % (ref 11.5–15.5)
WBC: 8.9 10*3/uL (ref 4.0–10.5)
nRBC: 0 % (ref 0.0–0.2)

## 2017-12-14 MED ORDER — NYSTATIN 100000 UNIT/ML MT SUSP
5.0000 mL | Freq: Four times a day (QID) | OROMUCOSAL | Status: DC
  Administered 2017-12-14 – 2017-12-15 (×3): 500000 [IU] via ORAL
  Filled 2017-12-14 (×3): qty 5

## 2017-12-14 MED ORDER — AMLODIPINE BESYLATE 5 MG PO TABS
5.0000 mg | ORAL_TABLET | Freq: Every day | ORAL | Status: DC
  Administered 2017-12-14: 5 mg via ORAL
  Filled 2017-12-14 (×2): qty 1

## 2017-12-14 NOTE — Care Management Note (Addendum)
Case Management Note  Patient Details  Name: [name] MRN: [id number] Date of Birth: 1960/06/13  Subjective/Objective:      Pneumonia. From home, independent. Recommended for [facility] PT and single point cane. Offered choice of Home health agency and DME company. Patient has no preference. Will give referral to [place]. Patient will probably need oxygen as well.               Action/Plan: Referral given to [facility] of [place] care for DME And home health. Await home O2 eval. Potential DC today. Patient will be staying with her daughter at time of DC.  Expected Discharge Date:      12/14/2017            Expected Discharge Plan:  [place]  In-House Referral:     Discharge planning Services  CM Consult  Post Acute Care Choice:  Durable Medical Equipment, Home Health Choice offered to:  Patient  DME Arranged:  Kasandra Knudsen, Oxygen DME Agency:  [place]:  PT [facility] Agency:  [place]  Status of Service:  Completed, signed off  If discussed at [place] of Stay Meetings, dates discussed:    Additional Comments:  Ardie Dragoo, Chauncey Reading, RN 12/14/2017, 12:37 PM

## 2017-12-14 NOTE — Progress Notes (Signed)
Physical Therapy Treatment Patient Details Name: [name] MRN: [id number] DOB: February 08, 1960 Today's Date: 12/14/2017    History of Present Illness [name] is a 57 y.o. female with medical history of hypertension, hyperlipidemia, constipation, left lung cancer 1997 status post wedge resection presenting with 4-day history of generalized weakness, shortness of breath, dizziness, and decreased oral intake.  The patient had been in her usual health until 12/06/2017 when she complained of the above symptoms.  Patient's had associated subjective fevers and chills and nonproductive cough.  She had some nausea but denied any vomiting or diarrhea.  The patient took 2 Aleve on 12/06/2017, but has not taken any NSAIDs since then.  She denies any other new medications.  On the evening of 12/09/2017,, the patient sustained a mechanical fall secondary to her worsening generalized weakness.  The patient stated that she was on her way to the bathroom on her legs gave out.  She gives a history of increasing falls in the past week secondary to her generalized weakness.  She denies any syncope.  The patient has been complaining of a sore throat but denies any headache or neck pain.  She is also been complaining of upper abdominal tenderness in the epigastric and left upper quadrant and right upper quadrant areas.  The patient did have one episode of emesis in the emergency department, but also states that she feels hungry and wants to eat and drink.  She denies any diarrhea, hematochezia, melena, dysuria, hematuria.    PT Comments    Patient demonstrates increased endurance/distance for gait training, but limited mostly due to difficulty breathing/SOB, having to take standing rest break before walking back to room.  Patient desaturated to 85% on room air when completing exercises while seated at bedside and stated she was having a panic attack due to difficulty breathing.  Patient put back on 3 LPM for gait training  with O2 saturation at 90-95% and good return for use of SPC without loss of balance.  Patient will benefit from continued physical therapy in hospital and recommended venue below to increase strength, balance, endurance for safe ADLs and gait.   Follow Up Recommendations  Home health PT     Equipment Recommendations  Cane    Recommendations for Other Services       Precautions / Restrictions Precautions Precautions: Fall Restrictions Weight Bearing Restrictions: No    Mobility  Bed Mobility Overal bed mobility: Independent                Transfers Overall transfer level: Modified independent               General transfer comment: increased time  Ambulation/Gait Ambulation/Gait assistance: Supervision Gait Distance (Feet): 65 Feet Assistive device: Straight cane Gait Pattern/deviations: Step-to pattern;Decreased step length - right;Decreased step length - left;Decreased stride length Gait velocity: decreased   General Gait Details: demonstrates increased endurance/distance for ambulation without loss of balance with mostly 3 point gait pattern, limited mostly due to SOB/difficulty breathing   Stairs             Wheelchair Mobility    Modified Rankin (Stroke Patients Only)       Balance Overall balance assessment: Mild deficits observed, not formally tested                                          Cognition Arousal/Alertness:  Awake/alert Behavior During Therapy: WFL for tasks assessed/performed Overall Cognitive Status: Within Functional Limits for tasks assessed                                        Exercises General Exercises - Lower Extremity Long Arc Quad: Seated;AROM;Strengthening;Both;10 reps Hip Flexion/Marching: Seated;AROM;Strengthening;Both;10 reps Toe Raises: Seated;AROM;Strengthening;Both;10 reps Heel Raises: Seated;AROM;Strengthening;Both;10 reps    General Comments        Pertinent  Vitals/Pain Pain Assessment: No/denies pain    Home Living                      Prior Function            PT Goals (current goals can now be found in the care plan section) Acute Rehab PT Goals Patient Stated Goal: return home with family to help (will be staying with her daughter) PT Goal Formulation: With patient Time For Goal Achievement: 12/20/17 Potential to Achieve Goals: Good Progress towards PT goals: Progressing toward goals    Frequency    Min 3X/week      PT Plan Current plan remains appropriate    Co-evaluation              AM-PAC PT "6 Clicks" Mobility   Outcome Measure  Help needed turning from your back to your side while in a flat bed without using bedrails?: None Help needed moving from lying on your back to sitting on the side of a flat bed without using bedrails?: None Help needed moving to and from a bed to a chair (including a wheelchair)?: None Help needed standing up from a chair using your arms (e.g., wheelchair or bedside chair)?: None Help needed to walk in hospital room?: A Little Help needed climbing 3-5 steps with a railing? : A Little 6 Click Score: 22    End of Session Equipment Utilized During Treatment: Oxygen Activity Tolerance: Patient tolerated treatment well;Patient limited by fatigue Patient left: in bed;with call bell/phone within reach Nurse Communication: Mobility status PT Visit Diagnosis: Unsteadiness on feet (R26.81);Other abnormalities of gait and mobility (R26.89);Muscle weakness (generalized) (M62.81)     Time: 6767-2094 PT Time Calculation (min) (ACUTE ONLY): 22 min  Charges:  $Gait Training: 8-22 mins $Therapeutic Exercise: 8-22 mins                     2:37 PM, 12/14/17 Lonell Grandchild, MPT Physical Therapist with [facility] [phone] office [phone] mobile phone

## 2017-12-14 NOTE — Care Management Important Message (Signed)
Important Message  Patient Details  Name: [name] MRN: [id number] Date of Birth: 08/29/1960   Medicare Important Message Given:  Yes    Vila Dory, Chauncey Reading, RN 12/14/2017, 12:53 PM

## 2017-12-14 NOTE — Progress Notes (Signed)
PROGRESS NOTE  [name] KPT:[id number] DOB: [birth year] DOA: 12/10/2017 PCP: Guadalupe Dawn, MD  Brief History: 57 y.o.femalewith medical history ofhypertension, hyperlipidemia, constipation, left lung cancer 1997 status post wedge resection presenting with 4-day history of generalized weakness, shortness of breath, dizziness, and decreased oral intake. The patient had been in her usual health until 12/06/2017 when she complained of the above symptoms. Patient's had associated subjective fevers and chills and nonproductive cough. She had some nausea but denied any vomiting or diarrhea. The patient took 2 Aleve on 12/06/2017, but has not taken any NSAIDs since then. She denies any other new medications. On the evening of 12/09/2017,, the patient sustained a mechanical fall secondary to her worsening generalized weakness. The patient stated that she was on her way to the bathroom on her legs gave out. She gives a history of increasing falls in the past week secondary to her generalized weakness. She denies any syncope. The patient has been complaining of a sore throat but denies any headache or neck pain. She is also been complaining of upper abdominal tenderness in the epigastric and left upper quadrant and right upper quadrant areasthat began on 12/05/2017. The patient did have one episode of emesis in the emergency department.Prior to this episode, the patient had denied any vomiting. The patient was admitted for treatment of community acquired pneumonia noted on chest x-ray with right upper lobe consolidation. On the evening of 12/10/2017, the patient had respiratory distress secondary to fluid overload. She was given furosemide 40 mg IV x1 with improvement. CT of the chest confirmed right upper lobe consolidation extending into the right lower lobe. CT of the abdomen and pelvis suggested partial small bowel obstruction. The patient was placed on bowel rest. General  surgery was consulted to assist with management.  Assessment/Plan: Sepsis -Secondary to pneumonia -Lactic acid peaked at 5.6 -The patient received the appropriate fluid boluses in the emergency department -saline lock IVF -Follow blood cultures--neg -UA--no significant pyuria -Check procalcitonin47.36>>>0.27 -Continue ceftriaxone  Acute respiratory failure with hypoxia -11/26--pt developed respiratory distress after going to bathroom -11/26--personally reviewed CXR-->increase alveolar and interstitial opacity R-lung; increase vascular congestion -give lasix 40 IV x 1 -Patient reported good urine output after Lasix -Oxygen saturation on 3 L stable -Continue steroids and treatment for pneumonia.  Lobar pneumonia -Urine Legionella antigen--neg -Urine Streptococcus pneumoniae antigen-POSITIVE -12/10/2017 CT chest--right upper lobe consolidation extending to the right lower lobe. Trace right pleural effusion. Right paratracheal adenopathy -Continue duo nebs -Viral respiratory panel--neg -Continue antibiotics.  Partial small bowel obstruction -12/10/2017 CT abdomen--small bowel dilatation up to 5.5 cm with air-fluid levels, but no transition point. Contrast extends into the colon and hepatic flexure. Status post cholecystectomy and hysterectomy. -General surgery consult appreciated -Patient passing gas and had bowel movement -No need for surgical intervention at this time. -will Advance diet  COPD exacerbation -Patient continues to have some wheezing on exam -Continue IV Solu-Medrol-; with anticipation to transition to oral prednisone for tapering. -Continue Pulmicort -Continue duo nebs -Continue oxygen supplementation.  AKI -Secondary to sepsis and volume depletion -Baseline creatinine 0.8-0.9 -Serum creatinine peaked 1.57 -Continue IV fluids, decrease rate and encourage good hydration. -Check basic metabolic panel in a.m. -Discontinue meloxicam  Sinus  tachycardia -Stressed induced from the patient's acute medical condition and rebound after stopping calcium channel blockers. -Amlodipine will be resumed dose -Heart rate is stable. -Discontinue telemetry.  Hyperbilirubinemia -Fractionate bilirubin--more direct -CT abdomen pelvis--as noted above -RUQ ultrasound--liver steatosis; no  ductal dilatation s/p cholecystectomy -trending down; ?congestion due to secondary to sepsis  Essential hypertension -Blood pressure started to rise and will resume amlodipine at half dose for now. -Continue heart healthy diet.  Chronic pain -Continue home doses of Lyrica and Cymbalta  Hyperlipidemia -continue statins  GERD/Schatzki's ring -Will continue PPI.  Hypokalemia/hypomagnesemia/hypophosphatemia -Otila Kluver to replete as needed.  Hyperglycemia -Hemoglobin A1c--5.5 -Likely stress-induced and secondary to the use of steroids -There is no anticipation of hypoglycemic agents at time of discharge.    Disposition Plan: Anticipate discharge back home 12/15/2016. Family Communication:Family at bedside-11/27  Consultants:none  Code Status: FULL   DVT Prophylaxis: Ullin Lovenox    Procedures: As Listed in Progress Note Above  Antibiotics: Ceftriaxone 11/23>>> Azithromycin 11/23>>>11/26   Subjective: Afebrile, no further nausea vomiting.  Patient passing flatus and had bowel movement.  Tolerating diet.  Still reporting feeling shortness of breath on exertion and is using oxygen supplementation; but overall feeling better and improved.  Objective: Vitals:   12/14/17 0605 12/14/17 0851 12/14/17 1049 12/14/17 1540  BP: (!) 146/76  (!) 156/66   Pulse: (!) 108     Resp: 19     Temp: 98.4 F (36.9 C)     TempSrc: Oral     SpO2: 100% 98%  96%  Weight:      Height:        Intake/Output Summary (Last 24 hours) at 12/14/2017 1910 Last data filed at 12/14/2017 0800 Gross per 24 hour  Intake 240 ml  Output -  Net  240 ml   Exam: General exam: Alert, awake, oriented x 3; reports feeling better and had bowel movement today.  Still with some shortness of breath on exertion and requiring oxygen supplementation. Respiratory system: Improved from movement bilaterally, mild expiratory wheezing, positive rhonchi.  No using accessory muscles. Cardiovascular system:RRR. No murmurs, rubs, gallops.  No JVD. Gastrointestinal system: Abdomen is nondistended, soft and nontender. No organomegaly or masses felt. Normal bowel sounds heard. Central nervous system: Alert and oriented. No focal neurological deficits. Extremities: No C/C/E, +pedal pulses Skin: No rashes, lesions or ulcers Psychiatry: Judgement and insight appear normal. Mood & affect appropriate.    Data Reviewed: I have personally reviewed following labs and imaging studies Basic Metabolic Panel: Recent Labs  Lab 12/10/17 0730 12/10/17 0749 12/11/17 0432 12/12/17 0439 12/13/17 0405 12/14/17 0442  NA 133*  --  137 139 141 141  K 3.2*  --  3.0* 3.4* 3.7 3.7  CL 97*  --  103 106 105 101  CO2 20*  --  22 26 28 30   GLUCOSE 133*  --  173* 136* 109* 120*  BUN 24*  --  15 13 10 12   CREATININE 1.57*  --  0.95 0.82 0.79 0.75  CALCIUM 8.2*  --  7.3* 7.6* 7.9* 7.9*  MG  --  1.2* 1.8 2.6*  --   --   PHOS  --   --  1.9* 1.7* 1.8*  --    Liver Function Tests: Recent Labs  Lab 12/10/17 0730 12/10/17 0749 12/11/17 0432 12/12/17 0439 12/13/17 0405  AST 25  --  17 24 27   ALT 17  --  14 18 20   ALKPHOS 131*  --  87 86 103  BILITOT 5.8* 4.7* 3.1* 1.6* 1.2  PROT 7.9  --  6.8 6.7 6.6  ALBUMIN 3.4*  --  2.8* 2.6* 2.6*   Recent Labs  Lab 12/10/17 0730  LIPASE 17   Coagulation Profile: Recent Labs  Lab  12/10/17 0730  INR 1.48   CBC: Recent Labs  Lab 12/10/17 0730 12/11/17 0432 12/12/17 0439 12/13/17 0405 12/14/17 0442  WBC 5.5 6.1 6.4 7.7 8.9  NEUTROABS 4.6  --   --   --   --   HGB 13.0 10.3* 9.2* 10.2* 10.8*  HCT 39.1 31.0* 28.2*  32.1* 33.8*  MCV 90.1 91.4 90.4 93.9 92.6  PLT 211 204 204 211 255   Cardiac Enzymes: Recent Labs  Lab 12/10/17 0730 12/10/17 0749  CKTOTAL  --  112  TROPONINI <0.03  --    Urine analysis:    Component Value Date/Time   COLORURINE AMBER (A) 12/10/2017 0945   APPEARANCEUR CLOUDY (A) 12/10/2017 0945   LABSPEC 1.018 12/10/2017 0945   PHURINE 5.0 12/10/2017 0945   GLUCOSEU NEGATIVE 12/10/2017 0945   HGBUR MODERATE (A) 12/10/2017 0945   HGBUR trace-intact 03/09/2007 1414   BILIRUBINUR SMALL (A) 12/10/2017 0945   BILIRUBINUR negative 04/14/2016 0919   BILIRUBINUR neg 09/27/2012 0850   KETONESUR NEGATIVE 12/10/2017 0945   PROTEINUR 30 (A) 12/10/2017 0945   UROBILINOGEN 4.0 (A) 04/14/2016 0919   UROBILINOGEN 0.2 08/05/2013 1025   NITRITE NEGATIVE 12/10/2017 0945   LEUKOCYTESUR NEGATIVE 12/10/2017 0945    Recent Results (from the past 240 hour(s))  Culture, blood (routine x 2)     Status: None (Preliminary result)   Collection Time: 12/10/17  8:33 AM  Result Value Ref Range Status   Specimen Description LEFT ANTECUBITAL  Final   Special Requests   Final    BOTTLES DRAWN AEROBIC AND ANAEROBIC Blood Culture adequate volume   Culture   Final    NO GROWTH 4 DAYS Performed at [facility], [address] [place], [place] [address]    Report Status PENDING  Incomplete  Culture, blood (routine x 2)     Status: None (Preliminary result)   Collection Time: 12/10/17  8:40 AM  Result Value Ref Range Status   Specimen Description BLOOD LEFT HAND  Final   Special Requests   Final    BOTTLES DRAWN AEROBIC AND ANAEROBIC Blood Culture adequate volume   Culture   Final    NO GROWTH 4 DAYS Performed at [facility], [address] [place], [place] [address]    Report Status PENDING  Incomplete  Culture, Urine     Status: Abnormal   Collection Time: 12/10/17  9:45 AM  Result Value Ref Range Status   Specimen Description   Final    URINE, CLEAN CATCH Performed at [facility],  [address], [place], [place] [address]    Special Requests   Final    NONE Performed at [facility], [address], [place], [place] [address]    Culture MULTIPLE SPECIES PRESENT, SUGGEST RECOLLECTION (A)  Final   Report Status 12/12/2017 FINAL  Final  MRSA PCR Screening     Status: None   Collection Time: 12/10/17 10:56 AM  Result Value Ref Range Status   MRSA by PCR NEGATIVE NEGATIVE Final    Comment:        The GeneXpert MRSA Assay (FDA approved for NASAL specimens only), is one component of a comprehensive MRSA colonization surveillance program. It is not intended to diagnose MRSA infection nor to guide or monitor treatment for MRSA infections. Performed at [facility], [address], [place], [place] [address]   Respiratory Panel by PCR     Status: None   Collection Time: 12/10/17 10:56 AM  Result Value Ref Range Status  Adenovirus NOT DETECTED NOT DETECTED Final   Coronavirus 229E NOT DETECTED NOT DETECTED Final   Coronavirus HKU1 NOT DETECTED NOT DETECTED Final   Coronavirus NL63 NOT DETECTED NOT DETECTED Final   Coronavirus OC43 NOT DETECTED NOT DETECTED Final   Metapneumovirus NOT DETECTED NOT DETECTED Final   Rhinovirus / Enterovirus NOT DETECTED NOT DETECTED Final   Influenza A NOT DETECTED NOT DETECTED Final   Influenza B NOT DETECTED NOT DETECTED Final   Parainfluenza Virus 1 NOT DETECTED NOT DETECTED Final   Parainfluenza Virus 2 NOT DETECTED NOT DETECTED Final   Parainfluenza Virus 3 NOT DETECTED NOT DETECTED Final   Parainfluenza Virus 4 NOT DETECTED NOT DETECTED Final   Respiratory Syncytial Virus NOT DETECTED NOT DETECTED Final   Bordetella pertussis NOT DETECTED NOT DETECTED Final   Chlamydophila pneumoniae NOT DETECTED NOT DETECTED Final   Mycoplasma pneumoniae NOT DETECTED NOT DETECTED Final    Comment: Performed at [facility], [place] [address], [place], [place] [address]     Scheduled Meds: . amitriptyline  150 mg Oral QHS  . amLODipine  5  mg Oral Daily  . atorvastatin  40 mg Oral Daily  . bisacodyl  10 mg Rectal BID  . budesonide (PULMICORT) nebulizer solution  0.5 mg Nebulization BID  . DULoxetine  60 mg Oral BID  . enoxaparin (LOVENOX) injection  40 mg Subcutaneous Q24H  . ipratropium-albuterol  3 mL Nebulization TID  . lubiprostone  24 mcg Oral BID WC  . magnesium hydroxide  30 mL Oral BID  . methylPREDNISolone (SOLU-MEDROL) injection  60 mg Intravenous Q12H  . nystatin  5 mL Oral QID  . pantoprazole  40 mg Oral Daily  . phosphorus  500 mg Oral BID WC  . pregabalin  100 mg Oral TID  . sodium chloride flush  3 mL Intravenous Q12H  . sodium chloride flush  3 mL Intravenous Q12H   Continuous Infusions: . sodium chloride    . 0.9 % NaCl with KCl 20 mEq / L 50 mL/hr at 12/14/17 1858  . cefTRIAXone (ROCEPHIN)  IV 2 g (12/14/17 1434)    Procedures/Studies: Ct Abdomen Pelvis Wo Contrast  Result Date: 12/10/2017 CLINICAL DATA:  57 year old female with history of increasing shortness of breath. Hypoxia. Generalized abdominal pain. Admitted to the hospital for sepsis. EXAM: CT CHEST, ABDOMEN AND PELVIS WITHOUT CONTRAST TECHNIQUE: Multidetector CT imaging of the chest, abdomen and pelvis was performed following the standard protocol without IV contrast. COMPARISON:  CT the abdomen and pelvis 02/15/2017. Chest CT 12/02/2008. FINDINGS: CT CHEST FINDINGS Cardiovascular: Heart size is normal. There is no significant pericardial fluid, thickening or pericardial calcification. Aortic atherosclerosis. No coronary artery calcifications are noted. Mediastinum/Nodes: Lymphadenopathy in the right paratracheal nodal stations measuring up to 16 mm in the high right paratracheal nodal station. Esophagus is unremarkable in appearance. No axillary lymphadenopathy. Lungs/Pleura: Extensive airspace consolidation throughout the right upper lobe, as well as a small portion of the superior segment of the right lower lobe. Areas of mild linear scarring  in the periphery of the left lung. Trace right pleural effusion. No left pleural effusion. No definite suspicious appearing pulmonary nodules. Musculoskeletal: There are no aggressive appearing lytic or blastic lesions noted in the visualized portions of the skeleton. CT ABDOMEN PELVIS FINDINGS Hepatobiliary: Diffuse low attenuation throughout the hepatic parenchyma, indicative of hepatic steatosis. No definite suspicious cystic or solid hepatic lesions are noted on today's noncontrast CT examination. Status post cholecystectomy. Pancreas: No definite pancreatic mass or  peripancreatic fluid or inflammatory changes are noted on today's noncontrast CT examination. Spleen: Unremarkable. Adrenals/Urinary Tract: Unenhanced appearance of both kidneys and adrenal glands is normal. No hydroureteronephrosis. Urinary bladder is normal in appearance. Stomach/Bowel: Stomach is normal in appearance. Severe dilatation of the small bowel measuring up to 5.5 cm in diameter, with multiple air-fluid levels. No definite transition point identified. Oral contrast material administered does extend into the colon to the level of the hepatic flexure. Distal colon is relatively decompressed. Normal appendix. Vascular/Lymphatic: Aortic atherosclerosis. No lymphadenopathy noted in the abdomen or pelvis. Reproductive: Status post hysterectomy. Ovaries are not confidently identified may be surgically absent or atrophic. Other: No significant volume of ascites.  No pneumoperitoneum. Musculoskeletal: Status post PLIF at L3-L4 with interbody graft at L3-L4 interspace. There are no aggressive appearing lytic or blastic lesions noted in the visualized portions of the skeleton. IMPRESSION: 1. Extensive airspace consolidation in the right lung, most confluent throughout the right upper lobe, most compatible with pneumonia. No definite centrally obstructing lesion identified at this time. Lymphadenopathy in the mediastinum is favored to be reactive.  Follow-up chest radiographs are recommended to ensure complete resolution of these findings. 2. Severe dilatation of the mid small bowel, with findings concerning for partial small bowel obstruction. 3. Trace right pleural effusion lying dependently. 4. Aortic atherosclerosis. Electronically Signed   By: Vinnie Langton M.D.   On: 12/10/2017 19:53   Dg Chest 2 View  Result Date: 12/10/2017 CLINICAL DATA:  Acute shortness of breath. History of LEFT lung cancer. EXAM: CHEST - 2 VIEW COMPARISON:  02/06/2017 chest radiograph and prior studies FINDINGS: A moderate to large area of RIGHT UPPER lobe consolidation noted. The LEFT lung is clear. Surgical clips in the LEFT hilum again noted. No pleural effusion or pneumothorax. Cardiomediastinal silhouette is otherwise unremarkable. No acute bony abnormalities identified. IMPRESSION: RIGHT UPPER lobe consolidation. If there is strong clinical suspicion for pneumonia, short-term radiographic follow-up following appropriate therapy recommended. Otherwise chest CT with contrast is recommended. Electronically Signed   By: Margarette Canada M.D.   On: 12/10/2017 08:21   Ct Chest Wo Contrast  Result Date: 12/10/2017 CLINICAL DATA:  57 year old female with history of increasing shortness of breath. Hypoxia. Generalized abdominal pain. Admitted to the hospital for sepsis. EXAM: CT CHEST, ABDOMEN AND PELVIS WITHOUT CONTRAST TECHNIQUE: Multidetector CT imaging of the chest, abdomen and pelvis was performed following the standard protocol without IV contrast. COMPARISON:  CT the abdomen and pelvis 02/15/2017. Chest CT 12/02/2008. FINDINGS: CT CHEST FINDINGS Cardiovascular: Heart size is normal. There is no significant pericardial fluid, thickening or pericardial calcification. Aortic atherosclerosis. No coronary artery calcifications are noted. Mediastinum/Nodes: Lymphadenopathy in the right paratracheal nodal stations measuring up to 16 mm in the high right paratracheal nodal  station. Esophagus is unremarkable in appearance. No axillary lymphadenopathy. Lungs/Pleura: Extensive airspace consolidation throughout the right upper lobe, as well as a small portion of the superior segment of the right lower lobe. Areas of mild linear scarring in the periphery of the left lung. Trace right pleural effusion. No left pleural effusion. No definite suspicious appearing pulmonary nodules. Musculoskeletal: There are no aggressive appearing lytic or blastic lesions noted in the visualized portions of the skeleton. CT ABDOMEN PELVIS FINDINGS Hepatobiliary: Diffuse low attenuation throughout the hepatic parenchyma, indicative of hepatic steatosis. No definite suspicious cystic or solid hepatic lesions are noted on today's noncontrast CT examination. Status post cholecystectomy. Pancreas: No definite pancreatic mass or peripancreatic fluid or inflammatory changes are noted on today's noncontrast  CT examination. Spleen: Unremarkable. Adrenals/Urinary Tract: Unenhanced appearance of both kidneys and adrenal glands is normal. No hydroureteronephrosis. Urinary bladder is normal in appearance. Stomach/Bowel: Stomach is normal in appearance. Severe dilatation of the small bowel measuring up to 5.5 cm in diameter, with multiple air-fluid levels. No definite transition point identified. Oral contrast material administered does extend into the colon to the level of the hepatic flexure. Distal colon is relatively decompressed. Normal appendix. Vascular/Lymphatic: Aortic atherosclerosis. No lymphadenopathy noted in the abdomen or pelvis. Reproductive: Status post hysterectomy. Ovaries are not confidently identified may be surgically absent or atrophic. Other: No significant volume of ascites.  No pneumoperitoneum. Musculoskeletal: Status post PLIF at L3-L4 with interbody graft at L3-L4 interspace. There are no aggressive appearing lytic or blastic lesions noted in the visualized portions of the skeleton. IMPRESSION:  1. Extensive airspace consolidation in the right lung, most confluent throughout the right upper lobe, most compatible with pneumonia. No definite centrally obstructing lesion identified at this time. Lymphadenopathy in the mediastinum is favored to be reactive. Follow-up chest radiographs are recommended to ensure complete resolution of these findings. 2. Severe dilatation of the mid small bowel, with findings concerning for partial small bowel obstruction. 3. Trace right pleural effusion lying dependently. 4. Aortic atherosclerosis. Electronically Signed   By: Vinnie Langton M.D.   On: 12/10/2017 19:53   Dg Chest Port 1 View  Result Date: 12/13/2017 CLINICAL DATA:  Respiratory distress, shortness of breath EXAM: PORTABLE CHEST 1 VIEW COMPARISON:  CT chest of 12/10/2017 and chest x-ray of the same day FINDINGS: There is worsening of airspace disease throughout the entire right lung, again most consistent with progression of pneumonia. The left lung is clear. The heart is within upper limits normal. No bony abnormality is seen. IMPRESSION: Worsening of airspace disease now throughout the entire right lung most consistent with pneumonia. The left lung is clear. Electronically Signed   By: Ivar Drape M.D.   On: 12/13/2017 14:55   Dg Chest Port 1 View  Result Date: 12/10/2017 CLINICAL DATA:  Respiratory distress EXAM: PORTABLE CHEST 1 VIEW COMPARISON:  12/10/2017. FINDINGS: Increased dense RIGHT UPPER lung consolidation noted. Cardiomediastinal silhouette is otherwise unremarkable. LEFT lung is clear. No pleural effusion or pneumothorax. IMPRESSION: Increasing dense RIGHT UPPER lung consolidation. Electronically Signed   By: Margarette Canada M.D.   On: 12/10/2017 16:19   US Abdomen Limited Ruq  Result Date: 12/11/2017 CLINICAL DATA:  Hyperbilirubinemia, history GERD, hypertension, hyperlipidemia, former smoker EXAM: ULTRASOUND ABDOMEN LIMITED RIGHT UPPER QUADRANT COMPARISON:  CT abdomen and pelvis  12/10/2017 FINDINGS: Gallbladder: Surgically absent Common bile duct: Diameter: 4 mm diameter, normal Liver: Normal morphology. Smooth contours without mass or nodularity. Slightly heterogeneous echogenicity which could represent subtle geographic fatty infiltration. Portal vein is patent on color Doppler imaging with normal direction of blood flow towards the liver. No RIGHT upper quadrant free fluid. IMPRESSION: Mildly heterogeneous fatty parenchyma which could reflect areas of mild geographic fatty infiltration. No discrete hepatic mass or nodularity seen. Post cholecystectomy without biliary dilatation. Electronically Signed   By: Lavonia Dana M.D.   On: 12/11/2017 10:59    Barton Dubois, MD  [facility] Pager [phone]  If 7PM-7AM, please contact night-coverage [email or web address] Password [email or web address] 12/14/2017, 7:10 PM   LOS: 4 days

## 2017-12-14 NOTE — Progress Notes (Signed)
Speech Language Pathology Treatment: Dysphagia  Patient Details Name: [name] MRN: [id number] DOB: 1960-01-27 Today's Date: 12/14/2017 Time: 2831-5176 SLP Time Calculation (min) (ACUTE ONLY): 25 min  Assessment / Plan / Recommendation Clinical Impression  Pt was provided skilled dysphagia therapy targeting trials of regular textures and thin liquids. Pt without overt s/sx of aspiration with the exception of one immediate coughing episode after a straw sip of thin taken while trying to talk to SLP and also swallow solid textures simultaneously. Pt further completed the 3 oz water challenge multiple times without incident. SLP provided education of precautions: utilize slow rate, not trying to talk while eating and small bites and sips. NOTE White coating on lingual surface resembling thrush and Pt report of sore lingual surface and roof of mouth. This was reported to nursing. With known history of esophageal dysphagia recommend follow up with GI and consider OP MBS if indicated. ST will continue to follow acutely    HPI HPI: 57 y.o. female with medical history of hypertension, hyperlipidemia, constipation, left lung cancer 1997 status post wedge resection presenting with 4-day history of generalized weakness, shortness of breath, dizziness, and decreased oral intake.  The patient had been in her usual health until 12/06/2017 when she complained of the above symptoms.  Patient's had associated subjective fevers and chills and nonproductive cough.  She had some nausea but denied any vomiting or diarrhea.  The patient took 2 Aleve on 12/06/2017, but has not taken any NSAIDs since then.  She denies any other new medications.  On the evening of 12/09/2017,, the patient sustained a mechanical fall secondary to her worsening generalized weakness.  The patient stated that she was on her way to the bathroom on her legs gave out.  She gives a history of increasing falls in the past week secondary to her  generalized weakness.  She denies any syncope.  The patient has been complaining of a sore throat but denies any headache or neck pain.  She is also been complaining of upper abdominal tenderness in the epigastric and left upper quadrant and right upper quadrant areas that began on 12/05/2017.  The patient did have one episode of emesis in the emergency department.  Prior to this episode, the patient had denied any vomiting.  The patient was admitted for treatment of community acquired pneumonia noted on chest x-ray with right upper lobe consolidation.  On the evening of 12/10/2017, the patient had respiratory distress secondary to fluid overload.  She was given furosemide 40 mg IV x1 with improvement.  CT of the chest confirmed right upper lobe consolidation extending into the right lower lobe.  CT of the abdomen and pelvis suggested partial small bowel obstruction.  The patient was placed on bowel rest.  General surgery was consulted to assist with management. BSE was requested. 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      SLP Plan  Continue with current plan of care       Recommendations  Diet recommendations: Dysphagia 3 (mechanical soft);Thin liquid Liquids provided via: Cup;Straw Medication Administration: Whole meds with puree Supervision: Patient able to self feed;Full supervision/cueing for compensatory strategies Compensations: Minimize environmental distractions;Slow rate;Small sips/bites Postural Changes and/or Swallow Maneuvers: Upright 30-60 min after meal;Seated upright 90 degrees                Oral Care Recommendations: Oral care QID Follow  up Recommendations: Home health SLP SLP Visit Diagnosis: Dysphagia, unspecified (R13.10) Plan: Continue with current plan of care        H. Roddie Mc, CCC-SLP Speech Language Pathologist                  [name] 12/14/2017, 2:28 PM

## 2017-12-14 NOTE — Progress Notes (Signed)
SATURATION QUALIFICATIONS: (This note is used to comply with regulatory documentation for home oxygen)  Patient Saturations on Room Air at Rest = 86 %   Patient saturations on 3L O2 at rest 94%    Please briefly explain why patient needs home oxygen:

## 2017-12-15 DIAGNOSIS — K219 Gastro-esophageal reflux disease without esophagitis: Secondary | ICD-10-CM

## 2017-12-15 LAB — CULTURE, BLOOD (ROUTINE X 2)
Culture: NO GROWTH
Culture: NO GROWTH
SPECIAL REQUESTS: ADEQUATE
Special Requests: ADEQUATE

## 2017-12-15 MED ORDER — BUDESONIDE-FORMOTEROL FUMARATE 160-4.5 MCG/ACT IN AERO: 2.0000 | INHALATION_SPRAY | Freq: Two times a day (BID) | RESPIRATORY_TRACT | 3 refills | Status: DC

## 2017-12-15 MED ORDER — AMOXICILLIN-POT CLAVULANATE 875-125 MG PO TABS: 1.0000 | ORAL_TABLET | Freq: Two times a day (BID) | ORAL | 0 refills | Status: AC

## 2017-12-15 MED ORDER — NYSTATIN 100000 UNIT/ML MT SUSP: 5.0000 mL | Freq: Four times a day (QID) | OROMUCOSAL | 0 refills | Status: AC

## 2017-12-15 MED ORDER — GUAIFENESIN ER 600 MG PO TB12: 600.0000 mg | ORAL_TABLET | Freq: Two times a day (BID) | ORAL | 0 refills | Status: DC

## 2017-12-15 MED ORDER — PREDNISONE 20 MG PO TABS: ORAL_TABLET | ORAL | 0 refills | Status: DC

## 2017-12-15 MED ORDER — AMLODIPINE BESYLATE 5 MG PO TABS
10.0000 mg | ORAL_TABLET | Freq: Every day | ORAL | Status: DC
  Administered 2017-12-15: 10 mg via ORAL
  Filled 2017-12-15: qty 2

## 2017-12-15 NOTE — Discharge Summary (Signed)
Physician Discharge Summary  [name] MVE:[id number] DOB: 1960/05/09 DOA: 12/10/2017  PCP: Guadalupe Dawn, MD  Admit date: 12/10/2017 Discharge date: 12/15/2017  Time spent: 35 minutes  Recommendations for Outpatient Follow-up:  1. Repeat comprehensive metabolic panel to follow LFTs, electrolytes and renal function 2. Repeat phosphorus and magnesium level to further assess stability of her electrolytes. 3. Repeat chest x-ray in 4-6 weeks to assure complete resolution of infiltrates. 4. Reassess blood pressure and further adjust antihypertensive regimen as needed 5. Reassess the need of oxygen supplementation.   Discharge Diagnoses:  Active Problems:   Sepsis due to undetermined organism ([place])   Lobar pneumonia (HCC)   AKI (acute kidney injury) ([place])   Hyperbilirubinemia   Partial small bowel obstruction (HCC)   COPD with acute exacerbation (HCC)   Hypophosphatemia   Respiratory distress   Sepsis ([place])   Acute respiratory failure with hypoxia ([place])   Discharge Condition: Stable and improved.  Patient discharged home with instruction to follow-up with PCP in 2 weeks.  Diet recommendation: Heart healthy diet.  Filed Weights   12/10/17 1123 12/12/17 0509 12/13/17 0500  Weight: 81.3 kg 81.5 kg 81.7 kg    History of present illness:  As per H&P written by Dr. [name] on 12/10/2017. 57 y.o. female with medical history of hypertension, hyperlipidemia, constipation, left lung cancer 1997 status post wedge resection presenting with 4-day history of generalized weakness, shortness of breath, dizziness, and decreased oral intake.  The patient had been in her usual health until 12/06/2017 when she complained of the above symptoms.  Patient's had associated subjective fevers and chills and nonproductive cough.  She had some nausea but denied any vomiting or diarrhea.  The patient took 2 Aleve on 12/06/2017, but has not taken any NSAIDs since then.  She denies any other new  medications.  On the evening of 12/09/2017,, the patient sustained a mechanical fall secondary to her worsening generalized weakness.  The patient stated that she was on her way to the bathroom on her legs gave out.  She gives a history of increasing falls in the past week secondary to her generalized weakness.  She denies any syncope.  The patient has been complaining of a sore throat but denies any headache or neck pain.  She is also been complaining of upper abdominal tenderness in the epigastric and left upper quadrant and right upper quadrant areas.  The patient did have one episode of emesis in the emergency department, but also states that she feels hungry and wants to eat and drink.  She denies any diarrhea, hematochezia, melena, dysuria, hematuria.  In the emergency department, the patient was afebrile but was tachycardic up to heart rate of 140.  She was hemodynamically stable.  Initial oxygen saturation was 94% on room air, but was placed on supplemental oxygen for comfort.  BMP showed sodium 133 with potassium 3.2 and serum creatinine 1.57.  CBC was essentially unremarkable with WBC 5.5.  Hepatic enzymes showed total bilirubin of 5.8 with alk phosphatase 131, AST 25, ALT 17.  Lipase was 17.  The patient was started on azithromycin and ceftriaxone after chest x-ray revealed right upper lobe consolidation.  Lactic acid was noted to be 5.6.  The patient was started on IV fluids.  Hospital Course:  Sepsis -Secondary to pneumonia -Lactic acid peaked at 5.6 -The patient received the appropriate fluid boluses during admission and hospitalization. -Follow blood cultures--neg -UA--no significant pyuria -Check procalcitonin47.36>>>0.27 -Patient antibiotic has been transitioned to Augmentin to complete  therapy and the patient has been stable discharged home with instructions to follow-up with PCP in 2 weeks. -Sepsis physiology resolved at time of discharge.  Acute respiratory failure with  hypoxia -11/26--pt developed respiratory distress after going to bathroom -11/26--personally reviewed CXR-->increase alveolar and interstitial opacity R-lung; increase vascular congestion -give lasix 40 IV x 1 -Patient reported good urine output after Lasix -Oxygen saturation on 2-3 L stable at discharge. -Continue steroids and treatment for pneumonia; wean oxygen supplementation as tolerated.  Lobar pneumonia -Urine Legionella antigen--neg -Urine Streptococcus pneumoniae antigen-POSITIVE -12/10/2017 CT chest--right upper lobe consolidation extending to the right lower lobe. Right paratracheal adenopathy -Viral respiratory panel--neg -Continue antibiotics therapy to complete treatment (at discharge 5 more days of antibiotics pending). -Repeat chest x-ray in 4-6 weeks to assure complete resolution of infiltrates.  Partial small bowel obstruction -12/10/2017 CT abdomen--small bowel dilatation up to 5.5 cm with air-fluid levels, but no transition point. Contrast extends into the colon and hepatic flexure. Status post cholecystectomy and hysterectomy. -General surgery consult appreciated -Patient passing gas and had bowel movement -No need for surgical intervention at this time. -Diet advanced and well-tolerated.  COPD exacerbation -Patient continues to have some wheezing on exam; but overall much improved and is currently not using any accessory muscles and is speaking in full sentences -Discharged on a steroids tapering using prednisone, continue rescue albuterol inhaler and the patient was started on Symbicort twice a day. -Wean oxygen supplementation as tolerated. -complete antibiotic therapy.  AKI -Secondary to sepsis and volume depletion -Baseline creatinine 0.8-0.9 -Serum creatinine peaked 1.57 -Meloxicam has been discontinued to avoid nephrotoxic agents -Repeat basic metabolic panel demonstrated resolution of acute kidney injury -Patient advised to keep yourself  well-hydrated. -Repeat basic metabolic panel follow-up visit to reassess renal function trend.  Sinus tachycardia -Stressed induced from the patient's acute medical condition and rebound after stopping calcium channel blockers. -Amlodipine resewn at time of discharge -Heart rate is stable and well-controlled; patient denies palpitations.  Hyperbilirubinemia -Fractionate bilirubin--more direct -CT abdomen pelvis--as noted above -RUQ ultrasound--liver steatosis; no ductal dilatation s/p cholecystectomy -trending down; ?congestion due to sepsis -Repeat LFTs at follow-up visit to assure complete resolution of elevated bilirubin. -Patient denies any abdominal pain, nausea, vomiting or any other complaints at discharge.  Essential hypertension -Patient advised to follow heart healthy diet/low-sodium. -Resume the use of Norvasc daily. -Reassess blood pressure at follow-up visit and adjust antihypertensive regimen as needed.  Chronic pain -Continue home doses of Lyrica and Cymbalta  Hyperlipidemia -continue statins  GERD/Schatzki's ring -Will continue PPI.  Hypokalemia/hypomagnesemia/hypophosphatemia -Repleted -Repeat basic metabolic panel, phosphorus level and magnesium to follow electrolytes trend as an outpatient  Hyperglycemia -Hemoglobin A1c--5.5 -Likely stress-induced and secondary to the use of steroids -No anticipation of hypoglycemic agents at time of discharge. -follow CBG's as an outpatient  Procedures:  See below for x-ray reports.  Consultations:  None  Discharge Exam: Vitals:   12/15/17 0520 12/15/17 0720  BP: (!) 161/86   Pulse: (!) 108   Resp: 18   Temp: 98.2 F (36.8 C)   SpO2: 93% 96%   General exam: Alert, awake, oriented x 3; reports feeling better, breathing easier and denies any abdominal pain, nausea or vomiting.  Still with some shortness of breath on exertion but able to speak in full sentences maintaining good oxygen saturation on  2-3 L oxygen supplementation. Respiratory system: Improved air movement bilaterally, mild intermittent expiratory wheezing, positive rhonchi.  No using accessory muscles. Cardiovascular system:RRR. No murmurs, rubs, gallops.  No JVD. Gastrointestinal  system: Abdomen is nondistended, soft and nontender. No organomegaly or masses felt. Normal bowel sounds heard. Central nervous system: Alert and oriented. No focal neurological deficits. Extremities: No C/C/E, +pedal pulses Skin: No rashes, lesions or ulcers Psychiatry: Judgement and insight appear normal. Mood & affect appropriate.    Discharge Instructions   Discharge Instructions    Diet - low sodium heart healthy   Complete by:  As directed    Discharge instructions   Complete by:  As directed    Increase activity as tolerated Use oxygen supplementation as instructed Take medications as prescribed Arrange follow-up with PCP in 10 days Keep yourself well hydrated. Avoid secondhand smoking.   Increase activity slowly   Complete by:  As directed      Allergies as of 12/15/2017      Reactions   Promethazine Hcl Nausea And Vomiting, Other (See Comments)   "Makes my head hurt really bad too"      Medication List    STOP taking these medications   meloxicam 7.5 MG tablet Commonly known as:  MOBIC     TAKE these medications   acyclovir 400 MG tablet Commonly known as:  ZOVIRAX TAKE 1 TABLET BY MOUTH TWICE DAILY   albuterol 108 (90 Base) MCG/ACT inhaler Commonly known as:  PROVENTIL HFA;VENTOLIN HFA Inhale 2 puffs into the lungs every 6 (six) hours as needed for wheezing or shortness of breath.   amitriptyline 150 MG tablet Commonly known as:  ELAVIL TAKE 1 TABLET BY MOUTH ONCE DAILY AT NIGHT   amLODipine 10 MG tablet Commonly known as:  NORVASC Take 1 tablet (10 mg total) by mouth daily.   amoxicillin-clavulanate 875-125 MG tablet Commonly known as:  AUGMENTIN Take 1 tablet by mouth 2 (two) times daily for 5  days.   atorvastatin 40 MG tablet Commonly known as:  LIPITOR Take 1 tablet (40 mg total) by mouth daily.   baclofen 10 MG tablet Commonly known as:  LIORESAL Take 1 tablet (10 mg total) by mouth as needed. What changed:    when to take this  reasons to take this   budesonide-formoterol 160-4.5 MCG/ACT inhaler Commonly known as:  SYMBICORT Inhale 2 puffs into the lungs 2 (two) times daily.   dexlansoprazole 60 MG capsule Commonly known as:  DEXILANT Take 1 capsule (60 mg total) by mouth daily before breakfast.   DULoxetine 60 MG capsule Commonly known as:  CYMBALTA Take 1 capsule (60 mg total) by mouth 2 (two) times daily.   guaiFENesin 600 MG 12 hr tablet Commonly known as:  MUCINEX Take 1 tablet (600 mg total) by mouth 2 (two) times daily.   lubiprostone 24 MCG capsule Commonly known as:  AMITIZA Take 1 capsule (24 mcg total) by mouth 2 (two) times daily with a meal.   nystatin 100000 UNIT/ML suspension Commonly known as:  MYCOSTATIN Take 5 mLs (500,000 Units total) by mouth 4 (four) times daily for 7 days.   predniSONE 20 MG tablet Commonly known as:  DELTASONE Take 3 tablets by mouth daily x1 day; then 2 tablets by mouth daily x2 days; then 1 tablet by mouth daily x2 days; then half tablet by mouth x3 days and stop prednisone.   pregabalin 100 MG capsule Commonly known as:  LYRICA Take 1 capsule (100 mg total) by mouth 3 (three) times daily.            Durable Medical Equipment  (From admission, onward)         Start  Ordered   12/14/17 1226  For home use only DME oxygen  Once    Question Answer Comment  Mode or (Route) Nasal cannula   Liters per Minute 3   Frequency Continuous (stationary and portable oxygen unit needed)   Oxygen conserving device Yes   Oxygen delivery system Gas      12/14/17 1225   12/14/17 0957  For home use only DME Cane  Once     12/14/17 0957         Allergies  Allergen Reactions  . Promethazine Hcl Nausea And  Vomiting and Other (See Comments)    "Makes my head hurt really bad too"   Follow-up Information    Guadalupe Dawn, MD. Schedule an appointment as soon as possible for a visit in 2 week(s).   Specialty:  Family Medicine Contact information: [address] [place] [address] [phone]           The results of significant diagnostics from this hospitalization (including imaging, microbiology, ancillary and laboratory) are listed below for reference.    Significant Diagnostic Studies: Ct Abdomen Pelvis Wo Contrast  Result Date: 12/10/2017 CLINICAL DATA:  57 year old female with history of increasing shortness of breath. Hypoxia. Generalized abdominal pain. Admitted to the hospital for sepsis. EXAM: CT CHEST, ABDOMEN AND PELVIS WITHOUT CONTRAST TECHNIQUE: Multidetector CT imaging of the chest, abdomen and pelvis was performed following the standard protocol without IV contrast. COMPARISON:  CT the abdomen and pelvis 02/15/2017. Chest CT 12/02/2008. FINDINGS: CT CHEST FINDINGS Cardiovascular: Heart size is normal. There is no significant pericardial fluid, thickening or pericardial calcification. Aortic atherosclerosis. No coronary artery calcifications are noted. Mediastinum/Nodes: Lymphadenopathy in the right paratracheal nodal stations measuring up to 16 mm in the high right paratracheal nodal station. Esophagus is unremarkable in appearance. No axillary lymphadenopathy. Lungs/Pleura: Extensive airspace consolidation throughout the right upper lobe, as well as a small portion of the superior segment of the right lower lobe. Areas of mild linear scarring in the periphery of the left lung. Trace right pleural effusion. No left pleural effusion. No definite suspicious appearing pulmonary nodules. Musculoskeletal: There are no aggressive appearing lytic or blastic lesions noted in the visualized portions of the skeleton. CT ABDOMEN PELVIS FINDINGS Hepatobiliary: Diffuse low attenuation  throughout the hepatic parenchyma, indicative of hepatic steatosis. No definite suspicious cystic or solid hepatic lesions are noted on today's noncontrast CT examination. Status post cholecystectomy. Pancreas: No definite pancreatic mass or peripancreatic fluid or inflammatory changes are noted on today's noncontrast CT examination. Spleen: Unremarkable. Adrenals/Urinary Tract: Unenhanced appearance of both kidneys and adrenal glands is normal. No hydroureteronephrosis. Urinary bladder is normal in appearance. Stomach/Bowel: Stomach is normal in appearance. Severe dilatation of the small bowel measuring up to 5.5 cm in diameter, with multiple air-fluid levels. No definite transition point identified. Oral contrast material administered does extend into the colon to the level of the hepatic flexure. Distal colon is relatively decompressed. Normal appendix. Vascular/Lymphatic: Aortic atherosclerosis. No lymphadenopathy noted in the abdomen or pelvis. Reproductive: Status post hysterectomy. Ovaries are not confidently identified may be surgically absent or atrophic. Other: No significant volume of ascites.  No pneumoperitoneum. Musculoskeletal: Status post PLIF at L3-L4 with interbody graft at L3-L4 interspace. There are no aggressive appearing lytic or blastic lesions noted in the visualized portions of the skeleton. IMPRESSION: 1. Extensive airspace consolidation in the right lung, most confluent throughout the right upper lobe, most compatible with pneumonia. No definite centrally obstructing lesion identified at this time. Lymphadenopathy  in the mediastinum is favored to be reactive. Follow-up chest radiographs are recommended to ensure complete resolution of these findings. 2. Severe dilatation of the mid small bowel, with findings concerning for partial small bowel obstruction. 3. Trace right pleural effusion lying dependently. 4. Aortic atherosclerosis. Electronically Signed   By: Vinnie Langton M.D.   On:  12/10/2017 19:53   Dg Chest 2 View  Result Date: 12/10/2017 CLINICAL DATA:  Acute shortness of breath. History of LEFT lung cancer. EXAM: CHEST - 2 VIEW COMPARISON:  02/06/2017 chest radiograph and prior studies FINDINGS: A moderate to large area of RIGHT UPPER lobe consolidation noted. The LEFT lung is clear. Surgical clips in the LEFT hilum again noted. No pleural effusion or pneumothorax. Cardiomediastinal silhouette is otherwise unremarkable. No acute bony abnormalities identified. IMPRESSION: RIGHT UPPER lobe consolidation. If there is strong clinical suspicion for pneumonia, short-term radiographic follow-up following appropriate therapy recommended. Otherwise chest CT with contrast is recommended. Electronically Signed   By: Margarette Canada M.D.   On: 12/10/2017 08:21   Ct Chest Wo Contrast  Result Date: 12/10/2017 CLINICAL DATA:  57 year old female with history of increasing shortness of breath. Hypoxia. Generalized abdominal pain. Admitted to the hospital for sepsis. EXAM: CT CHEST, ABDOMEN AND PELVIS WITHOUT CONTRAST TECHNIQUE: Multidetector CT imaging of the chest, abdomen and pelvis was performed following the standard protocol without IV contrast. COMPARISON:  CT the abdomen and pelvis 02/15/2017. Chest CT 12/02/2008. FINDINGS: CT CHEST FINDINGS Cardiovascular: Heart size is normal. There is no significant pericardial fluid, thickening or pericardial calcification. Aortic atherosclerosis. No coronary artery calcifications are noted. Mediastinum/Nodes: Lymphadenopathy in the right paratracheal nodal stations measuring up to 16 mm in the high right paratracheal nodal station. Esophagus is unremarkable in appearance. No axillary lymphadenopathy. Lungs/Pleura: Extensive airspace consolidation throughout the right upper lobe, as well as a small portion of the superior segment of the right lower lobe. Areas of mild linear scarring in the periphery of the left lung. Trace right pleural effusion. No left  pleural effusion. No definite suspicious appearing pulmonary nodules. Musculoskeletal: There are no aggressive appearing lytic or blastic lesions noted in the visualized portions of the skeleton. CT ABDOMEN PELVIS FINDINGS Hepatobiliary: Diffuse low attenuation throughout the hepatic parenchyma, indicative of hepatic steatosis. No definite suspicious cystic or solid hepatic lesions are noted on today's noncontrast CT examination. Status post cholecystectomy. Pancreas: No definite pancreatic mass or peripancreatic fluid or inflammatory changes are noted on today's noncontrast CT examination. Spleen: Unremarkable. Adrenals/Urinary Tract: Unenhanced appearance of both kidneys and adrenal glands is normal. No hydroureteronephrosis. Urinary bladder is normal in appearance. Stomach/Bowel: Stomach is normal in appearance. Severe dilatation of the small bowel measuring up to 5.5 cm in diameter, with multiple air-fluid levels. No definite transition point identified. Oral contrast material administered does extend into the colon to the level of the hepatic flexure. Distal colon is relatively decompressed. Normal appendix. Vascular/Lymphatic: Aortic atherosclerosis. No lymphadenopathy noted in the abdomen or pelvis. Reproductive: Status post hysterectomy. Ovaries are not confidently identified may be surgically absent or atrophic. Other: No significant volume of ascites.  No pneumoperitoneum. Musculoskeletal: Status post PLIF at L3-L4 with interbody graft at L3-L4 interspace. There are no aggressive appearing lytic or blastic lesions noted in the visualized portions of the skeleton. IMPRESSION: 1. Extensive airspace consolidation in the right lung, most confluent throughout the right upper lobe, most compatible with pneumonia. No definite centrally obstructing lesion identified at this time. Lymphadenopathy in the mediastinum is favored to be reactive. Follow-up chest  radiographs are recommended to ensure complete resolution  of these findings. 2. Severe dilatation of the mid small bowel, with findings concerning for partial small bowel obstruction. 3. Trace right pleural effusion lying dependently. 4. Aortic atherosclerosis. Electronically Signed   By: Vinnie Langton M.D.   On: 12/10/2017 19:53   Dg Chest Port 1 View  Result Date: 12/13/2017 CLINICAL DATA:  Respiratory distress, shortness of breath EXAM: PORTABLE CHEST 1 VIEW COMPARISON:  CT chest of 12/10/2017 and chest x-ray of the same day FINDINGS: There is worsening of airspace disease throughout the entire right lung, again most consistent with progression of pneumonia. The left lung is clear. The heart is within upper limits normal. No bony abnormality is seen. IMPRESSION: Worsening of airspace disease now throughout the entire right lung most consistent with pneumonia. The left lung is clear. Electronically Signed   By: Ivar Drape M.D.   On: 12/13/2017 14:55   Dg Chest Port 1 View  Result Date: 12/10/2017 CLINICAL DATA:  Respiratory distress EXAM: PORTABLE CHEST 1 VIEW COMPARISON:  12/10/2017. FINDINGS: Increased dense RIGHT UPPER lung consolidation noted. Cardiomediastinal silhouette is otherwise unremarkable. LEFT lung is clear. No pleural effusion or pneumothorax. IMPRESSION: Increasing dense RIGHT UPPER lung consolidation. Electronically Signed   By: Margarette Canada M.D.   On: 12/10/2017 16:19   US Abdomen Limited Ruq  Result Date: 12/11/2017 CLINICAL DATA:  Hyperbilirubinemia, history GERD, hypertension, hyperlipidemia, former smoker EXAM: ULTRASOUND ABDOMEN LIMITED RIGHT UPPER QUADRANT COMPARISON:  CT abdomen and pelvis 12/10/2017 FINDINGS: Gallbladder: Surgically absent Common bile duct: Diameter: 4 mm diameter, normal Liver: Normal morphology. Smooth contours without mass or nodularity. Slightly heterogeneous echogenicity which could represent subtle geographic fatty infiltration. Portal vein is patent on color Doppler imaging with normal direction of blood  flow towards the liver. No RIGHT upper quadrant free fluid. IMPRESSION: Mildly heterogeneous fatty parenchyma which could reflect areas of mild geographic fatty infiltration. No discrete hepatic mass or nodularity seen. Post cholecystectomy without biliary dilatation. Electronically Signed   By: Lavonia Dana M.D.   On: 12/11/2017 10:59   Microbiology: Recent Results (from the past 240 hour(s))  Culture, blood (routine x 2)     Status: None   Collection Time: 12/10/17  8:33 AM  Result Value Ref Range Status   Specimen Description LEFT ANTECUBITAL  Final   Special Requests   Final    BOTTLES DRAWN AEROBIC AND ANAEROBIC Blood Culture adequate volume   Culture   Final    NO GROWTH 5 DAYS Performed at [facility], [address], [place], [place] [address]    Report Status 12/15/2017 FINAL  Final  Culture, blood (routine x 2)     Status: None   Collection Time: 12/10/17  8:40 AM  Result Value Ref Range Status   Specimen Description BLOOD LEFT HAND  Final   Special Requests   Final    BOTTLES DRAWN AEROBIC AND ANAEROBIC Blood Culture adequate volume   Culture   Final    NO GROWTH 5 DAYS Performed at [facility], [address], [place], [place] [address]    Report Status 12/15/2017 FINAL  Final  Culture, Urine     Status: Abnormal   Collection Time: 12/10/17  9:45 AM  Result Value Ref Range Status   Specimen Description   Final    URINE, CLEAN CATCH Performed at [facility], [address] [place],  [address]    Special Requests   Final    NONE Performed at [facility], [address] [place], [place] [address]    Culture MULTIPLE SPECIES PRESENT, SUGGEST RECOLLECTION (A)  Final   Report Status 12/12/2017 FINAL  Final  MRSA PCR Screening     Status: None   Collection Time: 12/10/17 10:56 AM  Result Value Ref Range Status   MRSA by PCR NEGATIVE NEGATIVE Final    Comment:        The GeneXpert MRSA Assay (FDA approved for NASAL specimens only), is one component of  a comprehensive MRSA colonization surveillance program. It is not intended to diagnose MRSA infection nor to guide or monitor treatment for MRSA infections. Performed at [facility], [address], [place], [place] [address]   Respiratory Panel by PCR     Status: None   Collection Time: 12/10/17 10:56 AM  Result Value Ref Range Status   Adenovirus NOT DETECTED NOT DETECTED Final   Coronavirus 229E NOT DETECTED NOT DETECTED Final   Coronavirus HKU1 NOT DETECTED NOT DETECTED Final   Coronavirus NL63 NOT DETECTED NOT DETECTED Final   Coronavirus OC43 NOT DETECTED NOT DETECTED Final   Metapneumovirus NOT DETECTED NOT DETECTED Final   Rhinovirus / Enterovirus NOT DETECTED NOT DETECTED Final   Influenza A NOT DETECTED NOT DETECTED Final   Influenza B NOT DETECTED NOT DETECTED Final   Parainfluenza Virus 1 NOT DETECTED NOT DETECTED Final   Parainfluenza Virus 2 NOT DETECTED NOT DETECTED Final   Parainfluenza Virus 3 NOT DETECTED NOT DETECTED Final   Parainfluenza Virus 4 NOT DETECTED NOT DETECTED Final   Respiratory Syncytial Virus NOT DETECTED NOT DETECTED Final   Bordetella pertussis NOT DETECTED NOT DETECTED Final   Chlamydophila pneumoniae NOT DETECTED NOT DETECTED Final   Mycoplasma pneumoniae NOT DETECTED NOT DETECTED Final    Comment: Performed at [facility], [place] [address] [place], [place] [address]     Labs: Basic Metabolic Panel: Recent Labs  Lab 12/10/17 0730 12/10/17 0749 12/11/17 0432 12/12/17 0439 12/13/17 0405 12/14/17 0442  NA 133*  --  137 139 141 141  K 3.2*  --  3.0* 3.4* 3.7 3.7  CL 97*  --  103 106 105 101  CO2 20*  --  _0 GLUCOSE 133*  --  173* 136* 109* 120*  BUN 24*  --  _1 CREATININE 1.57*  --  0.95 0.82 0.79 0.75  CALCIUM 8.2*  --  7.3* 7.6* 7.9* 7.9*  MG  --  1.2* 1.8 2.6*  --   --   PHOS  --   --  1.9* 1.7* 1.8*  --    Liver Function Tests: Recent Labs  Lab 12/10/17 0730 12/10/17 0749 12/11/17 0432  12/12/17 0439 12/13/17 0405  AST 25  --  _2 ALT 17  --  _3 ALKPHOS 131*  --  87 86 103  BILITOT 5.8* 4.7* 3.1* 1.6* 1.2  PROT 7.9  --  6.8 6.7 6.6  ALBUMIN 3.4*  --  2.8* 2.6* 2.6*   Recent Labs  Lab 12/10/17 0730  LIPASE 17   CBC: Recent Labs  Lab 12/10/17 0730 12/11/17 0432 12/12/17 0439 12/13/17 0405 12/14/17 0442  WBC 5.5 6.1 6.4 7.7 8.9  NEUTROABS 4.6  --   --   --   --   HGB 13.0 10.3* 9.2* 10.2* 10.8*  HCT 39.1 31.0* 28.2* 32.1* 33.8*  MCV 90.1 91.4 90.4 93.9 92.6  PLT 211 204 204 211 255  Cardiac Enzymes: Recent Labs  Lab 12/10/17 0730 12/10/17 0749  CKTOTAL  --  112  TROPONINI <0.03  --    BNP: BNP (last 3 results) Recent Labs    12/13/17 0408  BNP 152.0*    Signed:  Barton Dubois MD.  [facility] 12/15/2017, 10:37 AM

## 2017-12-15 NOTE — Progress Notes (Signed)
Patient states understanding of discharge instructions.  

## 2017-12-15 NOTE — Care Management (Signed)
[facility] rep aware of DC today. DME has been delivered to to pt room.

## 2017-12-19 DIAGNOSIS — I10 Essential (primary) hypertension: Secondary | ICD-10-CM | POA: Diagnosis not present

## 2017-12-19 DIAGNOSIS — J9601 Acute respiratory failure with hypoxia: Secondary | ICD-10-CM | POA: Diagnosis not present

## 2017-12-19 DIAGNOSIS — J440 Chronic obstructive pulmonary disease with acute lower respiratory infection: Secondary | ICD-10-CM | POA: Diagnosis not present

## 2017-12-19 DIAGNOSIS — J441 Chronic obstructive pulmonary disease with (acute) exacerbation: Secondary | ICD-10-CM | POA: Diagnosis not present

## 2017-12-19 DIAGNOSIS — Z85118 Personal history of other malignant neoplasm of bronchus and lung: Secondary | ICD-10-CM | POA: Diagnosis not present

## 2017-12-19 DIAGNOSIS — G8929 Other chronic pain: Secondary | ICD-10-CM | POA: Diagnosis not present

## 2017-12-19 DIAGNOSIS — Z87891 Personal history of nicotine dependence: Secondary | ICD-10-CM | POA: Diagnosis not present

## 2017-12-19 DIAGNOSIS — J181 Lobar pneumonia, unspecified organism: Secondary | ICD-10-CM | POA: Diagnosis not present

## 2017-12-19 DIAGNOSIS — M1990 Unspecified osteoarthritis, unspecified site: Secondary | ICD-10-CM | POA: Diagnosis not present

## 2017-12-21 ENCOUNTER — Other Ambulatory Visit: Payer: Self-pay

## 2017-12-21 DIAGNOSIS — M1990 Unspecified osteoarthritis, unspecified site: Secondary | ICD-10-CM | POA: Diagnosis not present

## 2017-12-21 DIAGNOSIS — J440 Chronic obstructive pulmonary disease with acute lower respiratory infection: Secondary | ICD-10-CM | POA: Diagnosis not present

## 2017-12-21 DIAGNOSIS — J181 Lobar pneumonia, unspecified organism: Secondary | ICD-10-CM | POA: Diagnosis not present

## 2017-12-21 DIAGNOSIS — I10 Essential (primary) hypertension: Secondary | ICD-10-CM | POA: Diagnosis not present

## 2017-12-21 DIAGNOSIS — J9601 Acute respiratory failure with hypoxia: Secondary | ICD-10-CM | POA: Diagnosis not present

## 2017-12-21 DIAGNOSIS — J441 Chronic obstructive pulmonary disease with (acute) exacerbation: Secondary | ICD-10-CM | POA: Diagnosis not present

## 2017-12-21 NOTE — Progress Notes (Signed)
This encounter was created in error - please disregard.

## 2017-12-21 NOTE — Patient Outreach (Signed)
[facility]) Care Management  12/21/2017  [name] March 20, 1960 [id number]     EMMI- General discharge RED ON EMMI ALERT Day # 4 Date: 12/20/17 Red Alert Reason: "Sad/hopeless/anxious/empty? Yes"   Outreach attempt #1 spoke with patient. Reviewed and addressed red alert. Patient reports responding "Yes" to above red alert due to she is not feeling well today. Patient states "I am having diarrhea and my chest hurt." Reports diarrhea started about 3 days ago, she is going frequently, reports about "every 15 minutes", and "every time I urinate", I have diarrhea. Reports having 1 episode of her "chest hurting" and states this is a new symptom. Patient did not notify her doctor, states she made a follow up appointment for 12/30/17. Patient instructed to call her doctor asap, preferably after ending call to report new symptoms of diarrhea and chest pain. Patient instructed to drink plenty of fluids, especially water to stay hydrated. Patient verbalizes understanding and is agreeable to calling her PCP. Confirmed with patient she is wearing her Oxygen, reports O2 is set at 3 ltr per her doctor's instructions. Patient states she also received a visit from the [facility] PT today. Verified with patient she has completed all antibiotics. She has all of her medications in the home and reports no hardship with affording her medicines. Patient states her daughter will be able to provide her with transportation until she is cleared to drive and feeling better. Patient is agreeable to a call back in a couple of days for f/u. Patient provided the phone number for RN CM and encouraged to call if needed. Patient verbalizes understanding and is thankful for the call.        Plan:  RN CM will follow up with patient in 3-4 business days.    Enzo Montgomery, RN,BSN,CCM [place] Management Telephonic Care Management Coordinator Direct Phone: [phone] Toll Free: [phone] Fax:  [phone]

## 2017-12-22 ENCOUNTER — Ambulatory Visit (INDEPENDENT_AMBULATORY_CARE_PROVIDER_SITE_OTHER): Payer: Medicare Other | Admitting: Family Medicine

## 2017-12-22 ENCOUNTER — Other Ambulatory Visit: Payer: Self-pay

## 2017-12-22 VITALS — BP 114/40 | HR 89 | Temp 98.3°F | Wt 173.1 lb

## 2017-12-22 DIAGNOSIS — R1314 Dysphagia, pharyngoesophageal phase: Secondary | ICD-10-CM | POA: Diagnosis not present

## 2017-12-22 DIAGNOSIS — N898 Other specified noninflammatory disorders of vagina: Secondary | ICD-10-CM | POA: Diagnosis not present

## 2017-12-22 DIAGNOSIS — K521 Toxic gastroenteritis and colitis: Secondary | ICD-10-CM | POA: Diagnosis not present

## 2017-12-22 DIAGNOSIS — J181 Lobar pneumonia, unspecified organism: Secondary | ICD-10-CM | POA: Diagnosis not present

## 2017-12-22 DIAGNOSIS — T3695XA Adverse effect of unspecified systemic antibiotic, initial encounter: Secondary | ICD-10-CM | POA: Diagnosis not present

## 2017-12-22 MED ORDER — FLUCONAZOLE 150 MG PO TABS: 150.0000 mg | ORAL_TABLET | Freq: Once | ORAL | 0 refills | Status: AC

## 2017-12-22 NOTE — Assessment & Plan Note (Signed)
Likely a yeast infection also due to recent Augmentin use.  Prescribed Diflucan 1 5 0 mg once then again in 3 days if symptoms have not resolved.  Unlikely to be a UTI since Augmentin would have likely covered any organisms in her urine.  Patient unable to void today, so agreed to return if her symptoms persisted after taking Diflucan.

## 2017-12-22 NOTE — Patient Instructions (Addendum)
It was nice meeting you today Ms. Tamas!  You most likely had a yeast infection since you have been taking an antibiotic called Augmentin which can make yeast infections more likely.  Please take 1 pill of Diflucan today and a second pill in 3 days if you still have symptoms.    I am getting a blood test to measure your electrolytes since you have been having diarrhea.  I will call you if this result is abnormal.    Please call your GI doctor about your chest symptoms and take Pepcid, which is an over the counter medication, for relief.  Please return to your appointment in about a week to see if you still need oxygen.  It looks like you still need it today since your oxygen level was on the low side.  If you have any questions or concerns, please feel free to call the clinic.   Be well,  Dr. Shan Levans

## 2017-12-22 NOTE — Assessment & Plan Note (Signed)
Patient with pulse oximetry of 89% on 3 L supplemental O2 today but lung exam is reassuring.  Encourage patient to continue using oxygen and have her oxygen requirement reevaluated at her appointment on December 13.

## 2017-12-22 NOTE — Assessment & Plan Note (Addendum)
Appears to be resolving and was likely due to recent Augmentin use.  Encouraged patient to continue drinking plenty of fluids and to reduce Amitiza dose until her stools are more normal for her, since this medication can exacerbate her diarrhea.  Will obtain CMP today to check patient's electrolytes.

## 2017-12-22 NOTE — Assessment & Plan Note (Signed)
Likely a combination of possible GERD and esophageal stricture given patient's history of this.  Patient was encouraged to take Pepcid once daily as needed and to call her GI doctor about this issue.

## 2017-12-22 NOTE — Progress Notes (Signed)
Subjective:    [name] - 57 y.o. female MRN [id number]  Date of birth: 1960/06/29  CC diarrhea, GERD-like symptoms, vaginal itching and burning  HPI  [name] is here for diarrhea. - stool quality: dark green - frequency: 4-5 times per day, but slowed yesterday and has only had a BM once today - duration: started November 30 - blood in stool: no - urination frequency: normal - intake: normal - precipitating factors: Augmentin - taking lubiprostone: yes  GERD/esophageal stricture symptoms - burning and feeling like food and drink gets stuck in esophagus - has seen GI and gotten dilations before, but these are minimally effective  Vaginal itching and dysuria -some urgency, but no new hesitancy, frequency -has taken Augmentin recently for pneumonia -no changes in vaginal discharge  Health Maintenance:  Health Maintenance Due  Topic Date Due  . TETANUS/TDAP  09/18/2012  . MAMMOGRAM  11/09/2017    -  reports that she quit smoking about 22 years ago. Her smoking use included cigarettes. She has a 37.50 pack-year smoking history. She has never used smokeless tobacco. - Review of Systems: Per HPI. - Past Medical History: Patient Active Problem List   Diagnosis Date Noted  . Antibiotic-associated diarrhea 12/22/2017  . Vagina itching 12/22/2017  . Acute respiratory failure with hypoxia ([place]) 12/13/2017  . Sepsis ([place])   . Respiratory distress   . Partial small bowel obstruction ([place]) 12/11/2017  . COPD with acute exacerbation ([place]) 12/11/2017  . Hypophosphatemia 12/11/2017  . Sepsis due to undetermined organism ([place]) 12/10/2017  . Lobar pneumonia ([place]) 12/10/2017  . AKI (acute kidney injury) ([place]) 12/10/2017  . Hyperbilirubinemia 12/10/2017  . Aortic regurgitation 10/25/2017  . Diastolic dysfunction 78/29/5621  . Dysphagia 01/20/2017  . Left hip pain 01/19/2017  . Right hand pain 01/19/2017  . Diarrhea 10/11/2016  . Right hip pain 06/07/2016  . Dysuria  04/14/2016  . Malaise and fatigue 04/14/2016  . Breast pain, left 11/06/2015  . Mass of left forearm 10/10/2015    Class: Chronic  . Dysphagia, oropharyngeal   . Gastroesophageal reflux disease without esophagitis   . Carpal tunnel syndrome, right 07/11/2015    Class: Chronic  . Prediabetes 07/09/2015  . Lumbar stenosis with neurogenic claudication 01/05/2015  . Spondylolisthesis of lumbar region 01/03/2015    Class: Chronic  . Spinal stenosis, lumbar region, with neurogenic claudication 01/03/2015    Class: Chronic  . Soft tissue mass 07/23/2014    Class: Acute  . Coccygodynia 07/23/2014    Class: Acute  . Constipation 02/15/2014  . Dysphagia, pharyngoesophageal phase   . Elevated alkaline phosphatase level 11/06/2013  . Rectal bleeding 11/06/2013  . Obesity, unspecified 02/15/2013  . Genital herpes 10/12/2012  . HSV (herpes simplex virus) anogenital infection 09/29/2012  . UNSPECIFIED SLEEP APNEA 12/04/2008  . ADENOCARCINOMA, LUNG 10/09/2008  . Osteoarthritis of multiple joints 07/13/2006  . HYPERCHOLESTEROLEMIA 03/17/2006  . DEPRESSION, MAJOR, RECURRENT 03/17/2006  . HYPERTENSION, BENIGN SYSTEMIC 03/17/2006  . GASTROESOPHAGEAL REFLUX, NO ESOPHAGITIS 03/17/2006  . INSOMNIA NOS 03/17/2006   - Medications: reviewed and updated   Objective:   Physical Exam BP (!) 114/40   Pulse 89   Temp 98.3 F (36.8 C) (Oral)   Wt 173 lb 2 oz (78.5 kg)   SpO2 (!) 89%   BMI 29.72 kg/m  Gen: NAD, alert, cooperative with exam, well-appearing CV: RRR, good S1/S2, no murmur Resp: CTABL, no wheezes, non-labored, good air movements in all lung fields Abd: SNTND, BS present, no  guarding or organomegaly     Assessment & Plan:   Antibiotic-associated diarrhea Appears to be resolving and was likely due to recent Augmentin use.  Encouraged patient to continue drinking plenty of fluids and to reduce Amitiza dose until her stools are more normal for her, since this medication can exacerbate  her diarrhea.  Will obtain CMP today to check patient's electrolytes.  Vagina itching Likely a yeast infection also due to recent Augmentin use.  Prescribed Diflucan 1 5 0 mg once then again in 3 days if symptoms have not resolved.  Unlikely to be a UTI since Augmentin would have likely covered any organisms in her urine.  Patient unable to void today, so agreed to return if her symptoms persisted after taking Diflucan.  Lobar pneumonia ([place]) Patient with pulse oximetry of 89% on 3 L supplemental O2 today but lung exam is reassuring.  Encourage patient to continue using oxygen and have her oxygen requirement reevaluated at her appointment on December 13.  Dysphagia, pharyngoesophageal phase Likely a combination of possible GERD and esophageal stricture given patient's history of this.  Patient was encouraged to take Pepcid once daily as needed and to call her GI doctor about this issue.    Maia Breslow, M.D. 12/22/2017, 10:17 AM PGY-2, [place]

## 2017-12-23 ENCOUNTER — Other Ambulatory Visit: Payer: Self-pay

## 2017-12-23 ENCOUNTER — Encounter: Payer: Self-pay | Admitting: Family Medicine

## 2017-12-23 LAB — COMPREHENSIVE METABOLIC PANEL
A/G RATIO: 1.1 — AB (ref 1.2–2.2)
ALK PHOS: 146 IU/L — AB (ref 39–117)
ALT: 45 IU/L — AB (ref 0–32)
AST: 16 IU/L (ref 0–40)
Albumin: 3.4 g/dL — ABNORMAL LOW (ref 3.5–5.5)
BILIRUBIN TOTAL: 0.4 mg/dL (ref 0.0–1.2)
BUN/Creatinine Ratio: 15 (ref 9–23)
BUN: 11 mg/dL (ref 6–24)
CHLORIDE: 97 mmol/L (ref 96–106)
CO2: 28 mmol/L (ref 20–29)
Calcium: 8.7 mg/dL (ref 8.7–10.2)
Creatinine, Ser: 0.72 mg/dL (ref 0.57–1.00)
GFR calc Af Amer: 109 mL/min/{1.73_m2} (ref >59)
GFR calc non Af Amer: 95 mL/min/{1.73_m2} (ref >59)
Globulin, Total: 3 g/dL (ref 1.5–4.5)
Glucose: 92 mg/dL (ref 65–99)
POTASSIUM: 3.8 mmol/L (ref 3.5–5.2)
Sodium: 137 mmol/L (ref 134–144)
Total Protein: 6.4 g/dL (ref 6.0–8.5)

## 2017-12-23 NOTE — Patient Outreach (Signed)
[facility]) Care Management  12/23/2017  [name] 10-18-1960 [id number]     EMMI- General discharge Follow Up Call  Outreach attempt # 1 for follow up. Spoke with patient. Patient states her condition is improving. She reports her diarrhea is subsiding and denies having any further chest pain. Patient followed up with her doctor yesterday at the office and was instructed to continue to drink plenty of fluids and to reduce her Amitiza dose until her stools are more normal. Patient confirms she is drinking plenty of fluids and her appetite is good. She has completed a 2 day course of Diflucan for a yeast infection per her doctor. Patient continues to wear her Oxygen at 3 ltr per nasal cannula and denies having shortness of breath. She reports having a portable Oxygen tank for travel to her MD appts. Patient reports PT will see her again next week, she was given a home exercise plan in which the patient states she is able to do independently. RN CM instructed patient on when to call the doctor for worsening diarrhea, shortness of breath, chest pain or new or worsening weakness. RN CM encouraged patient to continue to drink plenty of fluids and to wear her Oxygen at all times as directed. RN CM encouraged patient to take her medications exactly as prescribed and patient was encouraged to keep her scheduled appointment with her PCP set for 12/30/17. RN CM provided patient with the 24/7 nurse line phone number and patient recorded the number.      Plan: RN CM will close the case as no further interventions are needed.    Enzo Montgomery, RN,BSN,CCM [place] Management Telephonic Care Management Coordinator Direct Phone: [phone] Toll Free: [phone] Fax: [phone]

## 2017-12-23 NOTE — Telephone Encounter (Signed)
This encounter was created in error - please disregard.

## 2017-12-26 ENCOUNTER — Telehealth: Payer: Self-pay | Admitting: Family Medicine

## 2017-12-26 DIAGNOSIS — J441 Chronic obstructive pulmonary disease with (acute) exacerbation: Secondary | ICD-10-CM | POA: Diagnosis not present

## 2017-12-26 DIAGNOSIS — M1990 Unspecified osteoarthritis, unspecified site: Secondary | ICD-10-CM | POA: Diagnosis not present

## 2017-12-26 DIAGNOSIS — J181 Lobar pneumonia, unspecified organism: Secondary | ICD-10-CM | POA: Diagnosis not present

## 2017-12-26 DIAGNOSIS — J440 Chronic obstructive pulmonary disease with acute lower respiratory infection: Secondary | ICD-10-CM | POA: Diagnosis not present

## 2017-12-26 DIAGNOSIS — I10 Essential (primary) hypertension: Secondary | ICD-10-CM | POA: Diagnosis not present

## 2017-12-26 DIAGNOSIS — J9601 Acute respiratory failure with hypoxia: Secondary | ICD-10-CM | POA: Diagnosis not present

## 2017-12-26 NOTE — Telephone Encounter (Signed)
Pt states that she did all her exercises today without O2 and wants to know if she can decrease to 2 liters per hour. Fleeger, Salome Spotted, CMA

## 2017-12-26 NOTE — Telephone Encounter (Signed)
Pt would like Dr. Kris Mouton to call her concerning some questions she has about her oxygen. Pt said she has appointment on Friday but would still like to speak with him before then. The best call back number is [phone].

## 2017-12-27 ENCOUNTER — Telehealth: Payer: Self-pay | Admitting: Family Medicine

## 2017-12-27 NOTE — Telephone Encounter (Signed)
Stacey with [facility] is calling for verbal orders for PT. PT was actually started on December 2nd but Erline Levine could not get in touch with our office so she wasn't able to get the verbal orders. She's requesting orders for PT twice a week for 4 weeks. Please call Erline Levine at [phone] with these orders.

## 2017-12-28 DIAGNOSIS — J440 Chronic obstructive pulmonary disease with acute lower respiratory infection: Secondary | ICD-10-CM | POA: Diagnosis not present

## 2017-12-28 DIAGNOSIS — J181 Lobar pneumonia, unspecified organism: Secondary | ICD-10-CM | POA: Diagnosis not present

## 2017-12-28 DIAGNOSIS — M1990 Unspecified osteoarthritis, unspecified site: Secondary | ICD-10-CM | POA: Diagnosis not present

## 2017-12-28 DIAGNOSIS — I10 Essential (primary) hypertension: Secondary | ICD-10-CM | POA: Diagnosis not present

## 2017-12-28 DIAGNOSIS — J9601 Acute respiratory failure with hypoxia: Secondary | ICD-10-CM | POA: Diagnosis not present

## 2017-12-28 DIAGNOSIS — J441 Chronic obstructive pulmonary disease with (acute) exacerbation: Secondary | ICD-10-CM | POA: Diagnosis not present

## 2017-12-28 NOTE — Telephone Encounter (Signed)
Spoke with stacy from [facility]. Gave ok for PT two times per week for 4 weeks.  Guadalupe Dawn MD PGY-2 Family Medicine Resident

## 2017-12-28 NOTE — Telephone Encounter (Signed)
Call patient and discuss her oxygen management with her.  By all accounts she did well without any oxygen.  Said that she can be off the oxygen for the next 2 days and see how that goes.  We can discuss that at her clinic appointment on Friday.  Did advise her to continue to wear the oxygen at night especially in the short-term, as she may not be able to tell if she is having desaturations.  Explained this could be dangerous.  Plan is to trial off oxygen for the next 2 days, continue oxygen at night.  Will follow-up on Friday and see how things are going at clinic.  Guadalupe Dawn MD PGY-2 Family Medicine Resident

## 2017-12-30 ENCOUNTER — Other Ambulatory Visit: Payer: Self-pay

## 2017-12-30 ENCOUNTER — Ambulatory Visit (INDEPENDENT_AMBULATORY_CARE_PROVIDER_SITE_OTHER): Payer: Medicare Other | Admitting: Family Medicine

## 2017-12-30 ENCOUNTER — Telehealth: Payer: Self-pay

## 2017-12-30 ENCOUNTER — Encounter: Payer: Self-pay | Admitting: Family Medicine

## 2017-12-30 VITALS — BP 102/50 | HR 88 | Temp 98.3°F | Ht 65.0 in | Wt 177.0 lb

## 2017-12-30 DIAGNOSIS — Z0000 Encounter for general adult medical examination without abnormal findings: Secondary | ICD-10-CM | POA: Diagnosis not present

## 2017-12-30 DIAGNOSIS — J9601 Acute respiratory failure with hypoxia: Secondary | ICD-10-CM | POA: Diagnosis not present

## 2017-12-30 DIAGNOSIS — J181 Lobar pneumonia, unspecified organism: Secondary | ICD-10-CM

## 2017-12-30 DIAGNOSIS — Z1231 Encounter for screening mammogram for malignant neoplasm of breast: Secondary | ICD-10-CM | POA: Diagnosis not present

## 2017-12-30 NOTE — Progress Notes (Signed)
   HPI 57 year old female who presents to discuss oxygen.  Patient admitted on 12/10/2017 at [facility] and was treated for a lobar pneumonia.  She was unable to wean off oxygen before discharge and since been on 2 to 3 L.  Patient's post discharge course was subsequently complicated by diarrhea secondary to antibiotic use.  Patient has been charting her oxygen sats off nasal cannula over the last 3 days.  Is consistently been 94 or above with check.  Discussed health maintenance topics of mammogram and Tdap.  Patient does not want Tdap but does want mammogram.  Orders placed, patient to get at [facility]  CC: hospital follow up   ROS:   Review of Systems See HPI for ROS.   CC, SH/smoking status, and VS noted  Objective: BP (!) 102/50   Pulse 88   Temp 98.3 F (36.8 C) (Oral)   Ht 5\' 5"  (1.651 m)   Wt 177 lb (80.3 kg)   SpO2 97%   BMI 29.45 kg/m  Gen: 57 year old AA female resting comfortably. HEENT: NCAT, EOMI, PERRL CV: RRR, no murmur Resp: Lungs clear to auscultation bilaterally, no wheezing, no crackles.  No accessory muscle use, no distress. Abd: SNTND, BS present, no guarding or organomegaly Ext: No edema, warm Neuro: Alert and oriented, Speech clear, No gross deficits   Assessment and plan:  Acute respiratory failure with hypoxemia (HCC) Due to lobar pneumonia.  Patient with no need for oxygen any longer.  Will consider problem resolved.  Lobar pneumonia [facility]) Patient with no physical exam findings consistent with lobar pneumonia.  No longer needs oxygen.  Problem resolved.   Orders Placed This Encounter  Procedures  . MM Digital Screening    Standing Status:   Future    Standing Expiration Date:   03/03/2019    Order Specific Question:   Reason for Exam (SYMPTOM  OR DIAGNOSIS REQUIRED)    Answer:   screening mammo    Order Specific Question:   Is the patient pregnant?    Answer:   No    Order Specific Question:   Preferred imaging  location?    Answer:   [facility]    No orders of the defined types were placed in this encounter.    Guadalupe Dawn MD PGY-2 Family Medicine Resident  01/02/2018 1:46 PM

## 2017-12-30 NOTE — Patient Instructions (Addendum)
It was great seeing you again today! I am glad that things have been going really well with your breathing. I do not think you need your oxygen anymore, as your saturation have been doing really well. If you do develop shortness of breath, please let me know.  I will put in an order for you to get your mammogram for [place]. You will get a letter with the results.

## 2017-12-30 NOTE — Telephone Encounter (Signed)
Patient left message that PCP will have to send order or call [facility] to D/C and pick up oxygen tank.  Call back is [phone]  Danley Danker, RN [facility] RN)

## 2018-01-02 ENCOUNTER — Encounter: Payer: Self-pay | Admitting: Family Medicine

## 2018-01-02 DIAGNOSIS — J181 Lobar pneumonia, unspecified organism: Secondary | ICD-10-CM | POA: Diagnosis not present

## 2018-01-02 DIAGNOSIS — J9601 Acute respiratory failure with hypoxia: Secondary | ICD-10-CM | POA: Diagnosis not present

## 2018-01-02 DIAGNOSIS — I10 Essential (primary) hypertension: Secondary | ICD-10-CM | POA: Diagnosis not present

## 2018-01-02 DIAGNOSIS — M1990 Unspecified osteoarthritis, unspecified site: Secondary | ICD-10-CM | POA: Diagnosis not present

## 2018-01-02 DIAGNOSIS — J441 Chronic obstructive pulmonary disease with (acute) exacerbation: Secondary | ICD-10-CM | POA: Diagnosis not present

## 2018-01-02 DIAGNOSIS — J440 Chronic obstructive pulmonary disease with acute lower respiratory infection: Secondary | ICD-10-CM | POA: Diagnosis not present

## 2018-01-02 NOTE — Assessment & Plan Note (Signed)
Patient with no physical exam findings consistent with lobar pneumonia.  No longer needs oxygen.  Problem resolved.

## 2018-01-02 NOTE — Telephone Encounter (Signed)
I will take care of this on Wednesday when I am back in clinic. I am at [facility] in [place] tomorrow and will not have the order form to fax.  Guadalupe Dawn MD PGY-2 Family Medicine Resident

## 2018-01-02 NOTE — Telephone Encounter (Signed)
Pt calling again about this order. [facility] will not pick up the oxygen from the pt's home until Kris Mouton has faxed them an order stating she has been taken off the oxygen and will no longer need the tank. Please advise

## 2018-01-02 NOTE — Assessment & Plan Note (Signed)
Due to lobar pneumonia.  Patient with no need for oxygen any longer.  Will consider problem resolved.

## 2018-01-04 NOTE — Telephone Encounter (Signed)
Filled out medical necessity form for insurance coverage. Also sent prescription for DC and pickup of oxygen components to [facility].  Guadalupe Dawn MD PGY-2 Family Medicine Resident

## 2018-01-06 ENCOUNTER — Ambulatory Visit (INDEPENDENT_AMBULATORY_CARE_PROVIDER_SITE_OTHER): Payer: Medicare Other | Admitting: Specialist

## 2018-01-09 ENCOUNTER — Encounter: Payer: Self-pay | Admitting: Gastroenterology

## 2018-01-09 ENCOUNTER — Ambulatory Visit (INDEPENDENT_AMBULATORY_CARE_PROVIDER_SITE_OTHER): Payer: Medicare Other | Admitting: Gastroenterology

## 2018-01-09 VITALS — BP 132/74 | HR 95 | Temp 97.0°F | Ht 65.0 in | Wt 176.4 lb

## 2018-01-09 DIAGNOSIS — R7989 Other specified abnormal findings of blood chemistry: Secondary | ICD-10-CM

## 2018-01-09 DIAGNOSIS — R1310 Dysphagia, unspecified: Secondary | ICD-10-CM

## 2018-01-09 DIAGNOSIS — K56600 Partial intestinal obstruction, unspecified as to cause: Secondary | ICD-10-CM | POA: Diagnosis not present

## 2018-01-09 DIAGNOSIS — K625 Hemorrhage of anus and rectum: Secondary | ICD-10-CM | POA: Diagnosis not present

## 2018-01-09 DIAGNOSIS — D649 Anemia, unspecified: Secondary | ICD-10-CM | POA: Insufficient documentation

## 2018-01-09 DIAGNOSIS — R1010 Upper abdominal pain, unspecified: Secondary | ICD-10-CM | POA: Diagnosis not present

## 2018-01-09 DIAGNOSIS — R945 Abnormal results of liver function studies: Secondary | ICD-10-CM | POA: Diagnosis not present

## 2018-01-09 NOTE — Assessment & Plan Note (Signed)
Recent partial SBO in setting of PNA/sepsis. Overall doing better. Complains of postprandial bloating and abdominal pain. Evaluate initially with abd film to reassess. ER precautions provided to patient.

## 2018-01-09 NOTE — Progress Notes (Signed)
Primary Care Physician: Guadalupe Dawn, MD  Primary Gastroenterologist:  Garfield Cornea, MD   Chief Complaint  Patient presents with  . Dysphagia    happens randomly, going on for a while  . Abdominal Pain    comes/goes but sometimes abd swells "very big"    HPI: [name] is a 57 y.o. female here for further evaluation of dysphagia and abdominal pain.  Patient last seen at time of colonoscopy in August 2019 to evaluate rectal bleeding, noted to have hemorrhoids, next colonoscopy in 10 years. She has a history of chronic constipation, GERD, dysphagia with multiple dilations in the past.  Last EGD February 2019 with mild Schatzki ring status post dilation, small hiatal hernia but otherwise normal exam. Relief of dysphagia symptoms were short-lived.    Patient was hospitalized back in November due to sepsis from pneumonia, acute kidney injury, partial small bowel obstruction in the setting of sepsis, also sustained a mechanical fall from generalized weakness the evening of November 22.  Her partial small bowel obstruction was treated conservatively.  She had a CT abdomen pelvis without contrast as outlined below.  Right upper quadrant ultrasound with liver steatosis, no ductal dilation status post cholecystectomy.  Abnormal LFTs felt to be related to possible congestion due to sepsis.  Is as noted during hospitalization. December 10, 2017 showed a normal hemoglobin of 13.  Hemoglobin 10.8 on November 27.  Total bilirubin 5.8 on November 23, 0.4 on 12/5.  AST creeped up to 45 on 12/22/2017 have been normal.  Alk phos normal at 103 on November 26 and up to 146 on 12/22/2017.      Having some dysphagia still, off/on. Doesn't matter with what she eats or drinks. Feels like it is stopping at top of throat and wants to come up into the nose area. Some heartburn and food just sitting there. Stools are loose, two times a day. Taking amitiza 54mg in am. Dropped down from BID. Bloating in abdomen.  Hurts in stomach. Sharp pains in upper abdomen and into lower abdomen.  Hurts to eat and by evening has swelling. Bloating/swelling worsened by meals. Still with a lot of coughing, could be making upper abdomen sore.   Denies NSAIDS/ASA. While in hospital and after discharge she had some brbpr on toilet tissue. She was on blood thinner shots.   Current Outpatient Medications  Medication Sig Dispense Refill  . acyclovir (ZOVIRAX) 400 MG tablet TAKE 1 TABLET BY MOUTH TWICE DAILY (Patient taking differently: Take 400 mg by mouth 2 (two) times daily. ) 180 tablet 3  . albuterol (PROAIR HFA) 108 (90 Base) MCG/ACT inhaler Inhale 2 puffs into the lungs every 6 (six) hours as needed for wheezing or shortness of breath. 1 Inhaler 2  . amitriptyline (ELAVIL) 150 MG tablet TAKE 1 TABLET BY MOUTH ONCE DAILY AT NIGHT 30 tablet 5  . amLODipine (NORVASC) 10 MG tablet Take 1 tablet (10 mg total) by mouth daily. 90 tablet 3  . atorvastatin (LIPITOR) 40 MG tablet Take 1 tablet (40 mg total) by mouth daily. 90 tablet 3  . baclofen (LIORESAL) 10 MG tablet Take 1 tablet (10 mg total) by mouth as needed. (Patient taking differently: Take 10 mg by mouth daily as needed for muscle spasms. ) 30 tablet 0  . budesonide-formoterol (SYMBICORT) 160-4.5 MCG/ACT inhaler Inhale 2 puffs into the lungs 2 (two) times daily. 1 Inhaler 3  . dexlansoprazole (DEXILANT) 60 MG capsule Take 1 capsule (60 mg total)  by mouth daily before breakfast. 90 capsule 3  . DULoxetine (CYMBALTA) 60 MG capsule Take 1 capsule (60 mg total) by mouth 2 (two) times daily. 120 capsule 11  . guaiFENesin (MUCINEX) 600 MG 12 hr tablet Take 1 tablet (600 mg total) by mouth 2 (two) times daily. 40 tablet 0  . lubiprostone (AMITIZA) 24 MCG capsule Take 1 capsule (24 mcg total) by mouth 2 (two) times daily with a meal. 180 capsule 3  . pregabalin (LYRICA) 100 MG capsule Take 1 capsule (100 mg total) by mouth 3 (three) times daily. 90 capsule 2   No current  facility-administered medications for this visit.     Allergies as of 01/09/2018 - Review Complete 01/09/2018  Allergen Reaction Noted  . Promethazine hcl Nausea And Vomiting and Other (See Comments) 07/26/2008    ROS:  General: Negative for anorexia, weight loss, fever, chills, fatigue, weakness. ENT: Negative for hoarseness,   nasal congestion. See hpi CV: Negative for chest pain, angina, palpitations, dyspnea on exertion, peripheral edema.  Respiratory: Negative for dyspnea at rest, dyspnea on exertion, cough, sputum, wheezing.  GI: See history of present illness. GU:  Negative for dysuria, hematuria, urinary incontinence, urinary frequency, nocturnal urination.  Endo: Negative for unusual weight change.    Physical Examination:   BP 132/74   Pulse 95   Temp (!) 97 F (36.1 C) (Oral)   Ht '5\' 5"'  (1.651 m)   Wt 176 lb 6.4 oz (80 kg)   BMI 29.35 kg/m   General: Well-nourished, well-developed in no acute distress.  Eyes: No icterus. Mouth: Oropharyngeal mucosa moist and pink , no lesions erythema or exudate. Lungs: Clear to auscultation bilaterally.  Heart: Regular rate and rhythm, no murmurs rubs or gallops.  Abdomen: Bowel sounds are normal, mild upper abdominal tenderness with deep palpation.  nondistended, no hepatosplenomegaly or masses, no abdominal bruits or hernia , no rebound or guarding.   Extremities: No lower extremity edema. No clubbing or deformities. Neuro: Alert and oriented x 4   Skin: Warm and dry, no jaundice.   Psych: Alert and cooperative, normal mood and affect.  Labs:  Lab Results  Component Value Date   CREATININE 0.72 12/22/2017   BUN 11 12/22/2017   NA 137 12/22/2017   K 3.8 12/22/2017   CL 97 12/22/2017   CO2 28 12/22/2017   Lab Results  Component Value Date   ALT 45 (H) 12/22/2017   AST 16 12/22/2017   ALKPHOS 146 (H) 12/22/2017   BILITOT 0.4 12/22/2017   Lab Results  Component Value Date   WBC 8.9 12/14/2017   HGB 10.8 (L)  12/14/2017   HCT 33.8 (L) 12/14/2017   MCV 92.6 12/14/2017   PLT 255 12/14/2017    Imaging Studies: Ct Abdomen Pelvis Wo Contrast  Result Date: 12/10/2017 CLINICAL DATA:  57 year old female with history of increasing shortness of breath. Hypoxia. Generalized abdominal pain. Admitted to the hospital for sepsis. EXAM: CT CHEST, ABDOMEN AND PELVIS WITHOUT CONTRAST TECHNIQUE: Multidetector CT imaging of the chest, abdomen and pelvis was performed following the standard protocol without IV contrast. COMPARISON:  CT the abdomen and pelvis 02/15/2017. Chest CT 12/02/2008. FINDINGS: CT CHEST FINDINGS Cardiovascular: Heart size is normal. There is no significant pericardial fluid, thickening or pericardial calcification. Aortic atherosclerosis. No coronary artery calcifications are noted. Mediastinum/Nodes: Lymphadenopathy in the right paratracheal nodal stations measuring up to 16 mm in the high right paratracheal nodal station. Esophagus is unremarkable in appearance. No axillary lymphadenopathy. Lungs/Pleura: Extensive airspace  consolidation throughout the right upper lobe, as well as a small portion of the superior segment of the right lower lobe. Areas of mild linear scarring in the periphery of the left lung. Trace right pleural effusion. No left pleural effusion. No definite suspicious appearing pulmonary nodules. Musculoskeletal: There are no aggressive appearing lytic or blastic lesions noted in the visualized portions of the skeleton. CT ABDOMEN PELVIS FINDINGS Hepatobiliary: Diffuse low attenuation throughout the hepatic parenchyma, indicative of hepatic steatosis. No definite suspicious cystic or solid hepatic lesions are noted on today's noncontrast CT examination. Status post cholecystectomy. Pancreas: No definite pancreatic mass or peripancreatic fluid or inflammatory changes are noted on today's noncontrast CT examination. Spleen: Unremarkable. Adrenals/Urinary Tract: Unenhanced appearance of both  kidneys and adrenal glands is normal. No hydroureteronephrosis. Urinary bladder is normal in appearance. Stomach/Bowel: Stomach is normal in appearance. Severe dilatation of the small bowel measuring up to 5.5 cm in diameter, with multiple air-fluid levels. No definite transition point identified. Oral contrast material administered does extend into the colon to the level of the hepatic flexure. Distal colon is relatively decompressed. Normal appendix. Vascular/Lymphatic: Aortic atherosclerosis. No lymphadenopathy noted in the abdomen or pelvis. Reproductive: Status post hysterectomy. Ovaries are not confidently identified may be surgically absent or atrophic. Other: No significant volume of ascites.  No pneumoperitoneum. Musculoskeletal: Status post PLIF at L3-L4 with interbody graft at L3-L4 interspace. There are no aggressive appearing lytic or blastic lesions noted in the visualized portions of the skeleton. IMPRESSION: 1. Extensive airspace consolidation in the right lung, most confluent throughout the right upper lobe, most compatible with pneumonia. No definite centrally obstructing lesion identified at this time. Lymphadenopathy in the mediastinum is favored to be reactive. Follow-up chest radiographs are recommended to ensure complete resolution of these findings. 2. Severe dilatation of the mid small bowel, with findings concerning for partial small bowel obstruction. 3. Trace right pleural effusion lying dependently. 4. Aortic atherosclerosis. Electronically Signed   By: Vinnie Langton M.D.   On: 12/10/2017 19:53   Ct Chest Wo Contrast  Result Date: 12/10/2017 CLINICAL DATA:  57 year old female with history of increasing shortness of breath. Hypoxia. Generalized abdominal pain. Admitted to the hospital for sepsis. EXAM: CT CHEST, ABDOMEN AND PELVIS WITHOUT CONTRAST TECHNIQUE: Multidetector CT imaging of the chest, abdomen and pelvis was performed following the standard protocol without IV contrast.  COMPARISON:  CT the abdomen and pelvis 02/15/2017. Chest CT 12/02/2008. FINDINGS: CT CHEST FINDINGS Cardiovascular: Heart size is normal. There is no significant pericardial fluid, thickening or pericardial calcification. Aortic atherosclerosis. No coronary artery calcifications are noted. Mediastinum/Nodes: Lymphadenopathy in the right paratracheal nodal stations measuring up to 16 mm in the high right paratracheal nodal station. Esophagus is unremarkable in appearance. No axillary lymphadenopathy. Lungs/Pleura: Extensive airspace consolidation throughout the right upper lobe, as well as a small portion of the superior segment of the right lower lobe. Areas of mild linear scarring in the periphery of the left lung. Trace right pleural effusion. No left pleural effusion. No definite suspicious appearing pulmonary nodules. Musculoskeletal: There are no aggressive appearing lytic or blastic lesions noted in the visualized portions of the skeleton. CT ABDOMEN PELVIS FINDINGS Hepatobiliary: Diffuse low attenuation throughout the hepatic parenchyma, indicative of hepatic steatosis. No definite suspicious cystic or solid hepatic lesions are noted on today's noncontrast CT examination. Status post cholecystectomy. Pancreas: No definite pancreatic mass or peripancreatic fluid or inflammatory changes are noted on today's noncontrast CT examination. Spleen: Unremarkable. Adrenals/Urinary Tract: Unenhanced appearance of both kidneys  and adrenal glands is normal. No hydroureteronephrosis. Urinary bladder is normal in appearance. Stomach/Bowel: Stomach is normal in appearance. Severe dilatation of the small bowel measuring up to 5.5 cm in diameter, with multiple air-fluid levels. No definite transition point identified. Oral contrast material administered does extend into the colon to the level of the hepatic flexure. Distal colon is relatively decompressed. Normal appendix. Vascular/Lymphatic: Aortic atherosclerosis. No  lymphadenopathy noted in the abdomen or pelvis. Reproductive: Status post hysterectomy. Ovaries are not confidently identified may be surgically absent or atrophic. Other: No significant volume of ascites.  No pneumoperitoneum. Musculoskeletal: Status post PLIF at L3-L4 with interbody graft at L3-L4 interspace. There are no aggressive appearing lytic or blastic lesions noted in the visualized portions of the skeleton. IMPRESSION: 1. Extensive airspace consolidation in the right lung, most confluent throughout the right upper lobe, most compatible with pneumonia. No definite centrally obstructing lesion identified at this time. Lymphadenopathy in the mediastinum is favored to be reactive. Follow-up chest radiographs are recommended to ensure complete resolution of these findings. 2. Severe dilatation of the mid small bowel, with findings concerning for partial small bowel obstruction. 3. Trace right pleural effusion lying dependently. 4. Aortic atherosclerosis. Electronically Signed   By: Vinnie Langton M.D.   On: 12/10/2017 19:53   Dg Chest Port 1 View  Result Date: 12/13/2017 CLINICAL DATA:  Respiratory distress, shortness of breath EXAM: PORTABLE CHEST 1 VIEW COMPARISON:  CT chest of 12/10/2017 and chest x-ray of the same day FINDINGS: There is worsening of airspace disease throughout the entire right lung, again most consistent with progression of pneumonia. The left lung is clear. The heart is within upper limits normal. No bony abnormality is seen. IMPRESSION: Worsening of airspace disease now throughout the entire right lung most consistent with pneumonia. The left lung is clear. Electronically Signed   By: Ivar Drape M.D.   On: 12/13/2017 14:55   Dg Chest Port 1 View  Result Date: 12/10/2017 CLINICAL DATA:  Respiratory distress EXAM: PORTABLE CHEST 1 VIEW COMPARISON:  12/10/2017. FINDINGS: Increased dense RIGHT UPPER lung consolidation noted. Cardiomediastinal silhouette is otherwise  unremarkable. LEFT lung is clear. No pleural effusion or pneumothorax. IMPRESSION: Increasing dense RIGHT UPPER lung consolidation. Electronically Signed   By: Margarette Canada M.D.   On: 12/10/2017 16:19   US Abdomen Limited Ruq  Result Date: 12/11/2017 CLINICAL DATA:  Hyperbilirubinemia, history GERD, hypertension, hyperlipidemia, former smoker EXAM: ULTRASOUND ABDOMEN LIMITED RIGHT UPPER QUADRANT COMPARISON:  CT abdomen and pelvis 12/10/2017 FINDINGS: Gallbladder: Surgically absent Common bile duct: Diameter: 4 mm diameter, normal Liver: Normal morphology. Smooth contours without mass or nodularity. Slightly heterogeneous echogenicity which could represent subtle geographic fatty infiltration. Portal vein is patent on color Doppler imaging with normal direction of blood flow towards the liver. No RIGHT upper quadrant free fluid. IMPRESSION: Mildly heterogeneous fatty parenchyma which could reflect areas of mild geographic fatty infiltration. No discrete hepatic mass or nodularity seen. Post cholecystectomy without biliary dilatation. Electronically Signed   By: Lavonia Dana M.D.   On: 12/11/2017 10:59

## 2018-01-09 NOTE — Progress Notes (Signed)
CC'D TO PCP °

## 2018-01-09 NOTE — Assessment & Plan Note (Signed)
F/u labs in next few days.

## 2018-01-09 NOTE — Patient Instructions (Signed)
Please have your labs done at [place]. They are closed Christmas Day and New Years Day.  Xray of abdomen at [facility] at your soonest convenience.  Xray of your swallowing/esophagus as scheduled after Christmas.  Go to ER if you have worsening abdominal pain, unable to hydrate, fever.

## 2018-01-09 NOTE — Assessment & Plan Note (Signed)
Short lived relief s/p dilation in the spring. H/o multiple dilations with h/o Schatzki ring's in the past. Evaluate by [facility] in the near future.

## 2018-01-09 NOTE — Assessment & Plan Note (Signed)
Recent colonoscopy reassuring. Rectal bleeding likely related to hemorrhoid disease. She has external hemorrhoids and grade III internal hemorrhoids. Discussed possibly hemorrhoid banding in the future. Provided handout information. She will let us know if she is interested in the future.

## 2018-01-09 NOTE — Assessment & Plan Note (Signed)
Likely multifactorial in setting of coughing and recent partial SBO. She is having good BMs. Will reassess with abd film 2 view. She denies NSAIDS/ASA. EGD earlier in the year. I doubt we are dealing with PUD.

## 2018-01-13 ENCOUNTER — Other Ambulatory Visit (HOSPITAL_COMMUNITY): Payer: Self-pay | Admitting: Family Medicine

## 2018-01-13 ENCOUNTER — Ambulatory Visit (HOSPITAL_COMMUNITY)
Admission: RE | Admit: 2018-01-13 | Discharge: 2018-01-13 | Disposition: A | Discharge status: Home / Self Care | Payer: Medicare Other | Source: Ambulatory Visit | Attending: Gastroenterology | Admitting: Gastroenterology

## 2018-01-13 DIAGNOSIS — R945 Abnormal results of liver function studies: Secondary | ICD-10-CM | POA: Insufficient documentation

## 2018-01-13 DIAGNOSIS — D649 Anemia, unspecified: Secondary | ICD-10-CM

## 2018-01-13 DIAGNOSIS — K625 Hemorrhage of anus and rectum: Secondary | ICD-10-CM | POA: Diagnosis not present

## 2018-01-13 DIAGNOSIS — R1010 Upper abdominal pain, unspecified: Secondary | ICD-10-CM | POA: Diagnosis not present

## 2018-01-13 DIAGNOSIS — R7989 Other specified abnormal findings of blood chemistry: Secondary | ICD-10-CM

## 2018-01-13 DIAGNOSIS — K56600 Partial intestinal obstruction, unspecified as to cause: Secondary | ICD-10-CM | POA: Insufficient documentation

## 2018-01-13 DIAGNOSIS — Z1231 Encounter for screening mammogram for malignant neoplasm of breast: Secondary | ICD-10-CM

## 2018-01-13 DIAGNOSIS — R1310 Dysphagia, unspecified: Secondary | ICD-10-CM | POA: Insufficient documentation

## 2018-01-13 DIAGNOSIS — K56609 Unspecified intestinal obstruction, unspecified as to partial versus complete obstruction: Secondary | ICD-10-CM | POA: Diagnosis not present

## 2018-01-14 LAB — COMPREHENSIVE METABOLIC PANEL
AG RATIO: 1.1 (calc) (ref 1.0–2.5)
ALBUMIN MSPROF: 4 g/dL (ref 3.6–5.1)
ALT: 11 U/L (ref 6–29)
AST: 13 U/L (ref 10–35)
Alkaline phosphatase (APISO): 156 U/L — ABNORMAL HIGH (ref 33–130)
BUN / CREAT RATIO: 8 (calc) (ref 6–22)
BUN: 6 mg/dL — ABNORMAL LOW (ref 7–25)
CHLORIDE: 101 mmol/L (ref 98–110)
CO2: 30 mmol/L (ref 20–32)
CREATININE: 0.76 mg/dL (ref 0.50–1.05)
Calcium: 9.3 mg/dL (ref 8.6–10.4)
GLOBULIN: 3.5 g/dL (ref 1.9–3.7)
Glucose, Bld: 94 mg/dL (ref 65–139)
Potassium: 4 mmol/L (ref 3.5–5.3)
SODIUM: 141 mmol/L (ref 135–146)
TOTAL PROTEIN: 7.5 g/dL (ref 6.1–8.1)
Total Bilirubin: 0.4 mg/dL (ref 0.2–1.2)

## 2018-01-14 LAB — CBC WITH DIFFERENTIAL/PLATELET
ABSOLUTE MONOCYTES: 290 {cells}/uL (ref 200–950)
BASOS ABS: 30 {cells}/uL (ref 0–200)
BASOS PCT: 0.6 %
EOS ABS: 120 {cells}/uL (ref 15–500)
Eosinophils Relative: 2.4 %
HCT: 37 % (ref 35.0–45.0)
HEMOGLOBIN: 12.5 g/dL (ref 11.7–15.5)
LYMPHS ABS: 1880 {cells}/uL (ref 850–3900)
MCH: 30.4 pg (ref 27.0–33.0)
MCHC: 33.8 g/dL (ref 32.0–36.0)
MCV: 90 fL (ref 80.0–100.0)
MONOS PCT: 5.8 %
MPV: 9.5 fL (ref 7.5–12.5)
NEUTROS ABS: 2680 {cells}/uL (ref 1500–7800)
Neutrophils Relative %: 53.6 %
Platelets: 404 10*3/uL — ABNORMAL HIGH (ref 140–400)
RBC: 4.11 10*6/uL (ref 3.80–5.10)
RDW: 13 % (ref 11.0–15.0)
Total Lymphocyte: 37.6 %
WBC: 5 10*3/uL (ref 3.8–10.8)

## 2018-01-14 LAB — LIPASE: Lipase: 17 U/L (ref 7–60)

## 2018-01-19 ENCOUNTER — Telehealth: Payer: Self-pay | Admitting: Internal Medicine

## 2018-01-19 ENCOUNTER — Other Ambulatory Visit: Payer: Self-pay

## 2018-01-19 DIAGNOSIS — R945 Abnormal results of liver function studies: Secondary | ICD-10-CM

## 2018-01-19 DIAGNOSIS — R7989 Other specified abnormal findings of blood chemistry: Secondary | ICD-10-CM

## 2018-01-19 MED ORDER — PANTOPRAZOLE SODIUM 40 MG PO TBEC: 40.0000 mg | DELAYED_RELEASE_TABLET | Freq: Two times a day (BID) | ORAL | 5 refills | Status: DC

## 2018-01-19 NOTE — Addendum Note (Signed)
Addended by: Mahala Menghini on: 01/19/2018 01:32 PM   Modules accepted: Orders

## 2018-01-19 NOTE — Telephone Encounter (Signed)
Reviewed labs, barium esophagram, and abdominal x-ray.  CBC shows normal hemoglobin, white blood cell count.  Kidney function normal.  LFTs normal except for mildly elevated alkaline phosphatase of 156, unclear significance.  Lipase normal.  She had borderline lower esophageal fold thickening, questionable reflux but no stricture.  Abdominal film showed moderate amount of stool, no obstruction.     1. For swallowing, and abnormal esophagus on xray, let's change her PPI so we can do BID dosing. I will send in for pantoprazole 40mg  30 minutes before breakfast and evening meal. STOP DEXILANT. 2. We need to have her consider going back to Amitiza 32mcg BID to see if her pain improves with better stools OR we can change to another medication. She had moderate amt of stool on xray.  3. Some of her upper abd pain could be reflux related or musculoskeletal due to all the coughing.  4. Return office visit here in urgent spot in 3-4 weeks. She should call sooner if no improvement.

## 2018-01-19 NOTE — Telephone Encounter (Signed)
Spoke with pt. She is still having abdominal pain which is a little worse than at her appointment. Pt isn't taking anything for her stomach pain and has no c/o N & V.pt is inquiring about her results.

## 2018-01-19 NOTE — Telephone Encounter (Signed)
At office visit she told me she reduced amitiza to once a day. She can take it twice a day. Do the pantoprazole for now.

## 2018-01-19 NOTE — Telephone Encounter (Signed)
Noted. Pt notified of LSL recommendations.

## 2018-01-19 NOTE — Telephone Encounter (Signed)
[phone] please call patient if her test results are in, she also wanted to let us know that her stomach is still hurting bad

## 2018-01-19 NOTE — Telephone Encounter (Signed)
Noted. Pt notified of results. Pt is aware that she needs to d/c Dexilant and start Pantoprazole twice daily. Pt said she is taking Amitiza 24 mcg twice daily and feels she's not constipated. Do you want to change the Amitiza or add something with it? Please advise.

## 2018-01-23 ENCOUNTER — Encounter: Payer: Self-pay | Admitting: Internal Medicine

## 2018-01-23 NOTE — Telephone Encounter (Signed)
SCHEDULED AND LETTER SENT  °

## 2018-02-06 ENCOUNTER — Telehealth: Payer: Self-pay | Admitting: Internal Medicine

## 2018-02-06 NOTE — Telephone Encounter (Signed)
Pt said that the medicine we put her on (starts with a P) is causing her to be constipated and is there anything else the provider would recommend. Please call her back at [phone]

## 2018-02-06 NOTE — Telephone Encounter (Signed)
Spoke with pt. Pt switched from Pantoprazole back to Dexilant 60 mg yesterday. Pt said Pantoprazole is causing constipation. I asked pt was she having any burning in her chest, vomiting or feelings of acid refflux. Pt said she isn't having any problems with acid reflux and is aware that the Pantoprazole is a medication not used to help with constipation.   For constipation, pt is taking Amitiza 24 mcg with no help with making her bowels move. Pt isn't taking any additional medications/stool softeners to help her bowels. Pt said she woke up every hour on the hour feeling like she had to have a bowel movement over the weekend. When she would have a bowel movement, it was soft and some the toilet water was red. Pt feels she didn't strain nor did she empty out completely. Pt didn't feel weak, no dizziness, no vomiting. Pt is scheduled on 02/15/18. Please advise.

## 2018-02-07 MED ORDER — LINACLOTIDE 145 MCG PO CAPS: 145.0000 ug | ORAL_CAPSULE | Freq: Every day | ORAL | 5 refills | Status: DC

## 2018-02-07 NOTE — Telephone Encounter (Signed)
Ok to switch back to [employer] once daily.  Stop amitiza. We will try Linzess. 149mcg daily on empty stomach. rx sent in. Rectal bleeding likely due to known hemorrhoids, recent tcs.  Keep upcoming ov next week.

## 2018-02-07 NOTE — Addendum Note (Signed)
Addended by: Mahala Menghini on: 02/07/2018 05:40 PM   Modules accepted: Orders

## 2018-02-08 NOTE — Telephone Encounter (Signed)
Spoke with pt and she is aware of LSL recommendations. She says the Amitiza wan't the problem. She will switch back to the [place] and will f/u with LSL next week.

## 2018-02-10 ENCOUNTER — Telehealth: Payer: Self-pay | Admitting: Internal Medicine

## 2018-02-10 NOTE — Telephone Encounter (Signed)
Noted. Pt is aware how to take her medications. See phone note from 02/09/18.

## 2018-02-10 NOTE — Telephone Encounter (Signed)
Patient called and wants to know if she is supposed to take the dexilant and the linzess or just the linzess.  Wants to know if leslie wanted her on both? [phone]

## 2018-02-15 ENCOUNTER — Ambulatory Visit (INDEPENDENT_AMBULATORY_CARE_PROVIDER_SITE_OTHER): Payer: Medicare Other | Admitting: Gastroenterology

## 2018-02-15 ENCOUNTER — Encounter: Payer: Self-pay | Admitting: Gastroenterology

## 2018-02-15 VITALS — BP 127/75 | HR 87 | Temp 97.9°F | Ht 65.0 in | Wt 183.0 lb

## 2018-02-15 DIAGNOSIS — R748 Abnormal levels of other serum enzymes: Secondary | ICD-10-CM

## 2018-02-15 DIAGNOSIS — K625 Hemorrhage of anus and rectum: Secondary | ICD-10-CM | POA: Diagnosis not present

## 2018-02-15 DIAGNOSIS — K5900 Constipation, unspecified: Secondary | ICD-10-CM

## 2018-02-15 DIAGNOSIS — R140 Abdominal distension (gaseous): Secondary | ICD-10-CM | POA: Diagnosis not present

## 2018-02-15 DIAGNOSIS — K649 Unspecified hemorrhoids: Secondary | ICD-10-CM | POA: Insufficient documentation

## 2018-02-15 DIAGNOSIS — K642 Third degree hemorrhoids: Secondary | ICD-10-CM

## 2018-02-15 MED ORDER — LINACLOTIDE 290 MCG PO CAPS: 290.0000 ug | ORAL_CAPSULE | Freq: Every day | ORAL | 11 refills | Status: DC

## 2018-02-15 NOTE — Assessment & Plan Note (Signed)
Recent moderate volume hematochezia.  History of hemorrhoids on recent colonoscopy.  Grade 3 hemorrhoids noted.  On rectal exam, hemorrhoidal disease noted.  No fresh blood present.  No other significant findings.  Discussed [facility] hemorrhoid banding with patient.  She is interested in pursuing.  We will put her Vicente Males Boone's banding schedule in the upcoming future, per patient request.  We will update CBC given recent moderate bleeding.

## 2018-02-15 NOTE — Progress Notes (Signed)
Primary Care Physician: Guadalupe Dawn, MD  Primary Gastroenterologist:  Garfield Cornea, MD   Chief Complaint  Patient presents with  . Constipation    HPI: [name] is a 58 y.o. female here for follow-up.  She was seen back in December for dysphagia, abdominal pain.  Patient had a colonoscopy in August 2019 to evaluate for rectal bleeding, noted to have hemorrhoids, next colonoscopy in 10 years.  He has a history of chronic constipation, GERD, dysphagia with multiple dilations in the past.  Last EGD February 2019 with mild Schatzki ring status post dilation, small hiatal hernia but otherwise normal exam.  Relief of dysphagia symptoms were short-lived.  Patient was hospitalized in November for sepsis from pneumonia, acute kidney injury, partial small bowel obstruction in the setting of sepsis.  This was treated conservatively.  She had abnormal LFTs at the time felt congestion due to sepsis.  Right upper quadrant ultrasound showed liver steatosis dilation status post cholecystectomy.  Additional labs showed normal hemoglobin, normal kidney function, mildly elevated alkaline phosphatase of 156 otherwise LFTs normal.  She had borderline lower esophageal fold thickening, questionable reflux but no stricture on esophagram.  Changed her PPI to pantoprazole twice daily.  Felt the pantoprazole increased her constipation.  She switched herself back to Dexilant 60 mg daily.  She had increased Amitiza back to 24 mcg twice daily but did not feel like it was effective.  We switched her to Linzess 145 mcg daily.  Patient presents today with complaints of a lot of rectal bleeding last Sunday, three to four times. When wiped saw it.  A lot of fresh blood in the toilet.  No rectal pain. Passes a little bit of stool each time. Not productive. Feels so bloated. Nausea but no vomiting. No heartburn.  No unintentional weight loss.  She is interested in having her hemorrhoids "taken out".  Complains of  abdominal distention.  Feels like when she had a partial small bowel obstruction.  Current Outpatient Medications  Medication Sig Dispense Refill  . acyclovir (ZOVIRAX) 400 MG tablet TAKE 1 TABLET BY MOUTH TWICE DAILY (Patient taking differently: Take 400 mg by mouth 2 (two) times daily. ) 180 tablet 3  . albuterol (PROAIR HFA) 108 (90 Base) MCG/ACT inhaler Inhale 2 puffs into the lungs every 6 (six) hours as needed for wheezing or shortness of breath. 1 Inhaler 2  . amitriptyline (ELAVIL) 150 MG tablet TAKE 1 TABLET BY MOUTH ONCE DAILY AT NIGHT 30 tablet 5  . amLODipine (NORVASC) 10 MG tablet Take 1 tablet (10 mg total) by mouth daily. 90 tablet 3  . atorvastatin (LIPITOR) 40 MG tablet Take 1 tablet (40 mg total) by mouth daily. 90 tablet 3  . budesonide-formoterol (SYMBICORT) 160-4.5 MCG/ACT inhaler Inhale 2 puffs into the lungs 2 (two) times daily. 1 Inhaler 3  . dexlansoprazole (DEXILANT) 60 MG capsule Take 1 capsule (60 mg total) by mouth daily before breakfast. 90 capsule 3  . DULoxetine (CYMBALTA) 60 MG capsule Take 1 capsule (60 mg total) by mouth 2 (two) times daily. 120 capsule 11  . guaiFENesin (MUCINEX) 600 MG 12 hr tablet Take 1 tablet (600 mg total) by mouth 2 (two) times daily. 40 tablet 0  . linaclotide (LINZESS) 145 MCG CAPS capsule Take 1 capsule (145 mcg total) by mouth daily before breakfast. 30 capsule 5  . pregabalin (LYRICA) 100 MG capsule Take 1 capsule (100 mg total) by mouth 3 (three) times daily. 90 capsule 2  No current facility-administered medications for this visit.     Allergies as of 02/15/2018 - Review Complete 02/15/2018  Allergen Reaction Noted  . Promethazine hcl Nausea And Vomiting and Other (See Comments) 07/26/2008    ROS:  General: Negative for anorexia, weight loss, fever, chills, fatigue, weakness. ENT: Negative for hoarseness, difficulty swallowing , nasal congestion. CV: Negative for chest pain, angina, palpitations, dyspnea on exertion,  peripheral edema.  Respiratory: Negative for dyspnea at rest, dyspnea on exertion, cough, sputum, wheezing.  GI: See history of present illness. GU:  Negative for dysuria, hematuria, urinary incontinence, urinary frequency, nocturnal urination.  Endo: Negative for unusual weight change.    Physical Examination:   BP 127/75   Pulse 87   Temp 97.9 F (36.6 C) (Oral)   Ht 5\' 5"  (1.651 m)   Wt 183 lb (83 kg)   BMI 30.45 kg/m   General: Well-nourished, well-developed in no acute distress.  Eyes: No icterus. Mouth: Oropharyngeal mucosa moist and pink , no lesions erythema or exudate. Lungs: Clear to auscultation bilaterally.  Heart: Regular rate and rhythm, no murmurs rubs or gallops.  Abdomen: Bowel sounds are normal, nontender, nondistended, no hepatosplenomegaly or masses, no abdominal bruits or hernia , no rebound or guarding.   Rectal exam: Small external hemorrhoid noted without any irritation.  Small amount of light brown stool noted perianally.  Nontender rectal exam.  Brown stool heme negative.  No rectal masses. Extremities: No lower extremity edema. No clubbing or deformities. Neuro: Alert and oriented x 4   Skin: Warm and dry, no jaundice.   Psych: Alert and cooperative, normal mood and affect.  Labs:  Lab Results  Component Value Date   CREATININE 0.76 01/13/2018   BUN 6 (L) 01/13/2018   NA 141 01/13/2018   K 4.0 01/13/2018   CL 101 01/13/2018   CO2 30 01/13/2018   Lab Results  Component Value Date   ALT 11 01/13/2018   AST 13 01/13/2018   ALKPHOS  156 01/13/2018   BILITOT 0.4 01/13/2018   Lab Results  Component Value Date   WBC 5.0 01/13/2018   HGB 12.5 01/13/2018   HCT 37.0 01/13/2018   MCV 90.0 01/13/2018   PLT 404 (H) 01/13/2018     Imaging Studies: No results found.

## 2018-02-15 NOTE — Assessment & Plan Note (Signed)
Recheck labs now.

## 2018-02-15 NOTE — Progress Notes (Signed)
CC'D TO PCP °

## 2018-02-15 NOTE — Assessment & Plan Note (Signed)
Patient continues to complain of poorly controlled constipation, abdominal bloating/distention.  Partial small bowel obstruction noted last year.  Constipation not ideally controlled and likely with improvement of bowel function she will have less bloating symptoms.  We will check abdominal film to assess.  Increase Linzess to 290 mcg daily.

## 2018-02-15 NOTE — Patient Instructions (Addendum)
Please have your xray and labs done at [facility] within the next 1-2 days.   Increase Linzess to 239mcg daily before breakfast. Samples provided. RX sent to [facility].   Continue Dexilant for your heartburn, reflux.   Return to hemorrhoid banding as scheduled.

## 2018-02-16 ENCOUNTER — Telehealth: Payer: Self-pay

## 2018-02-16 ENCOUNTER — Ambulatory Visit (INDEPENDENT_AMBULATORY_CARE_PROVIDER_SITE_OTHER): Payer: Medicare Other | Admitting: Specialist

## 2018-02-16 ENCOUNTER — Encounter (INDEPENDENT_AMBULATORY_CARE_PROVIDER_SITE_OTHER): Payer: Self-pay | Admitting: Specialist

## 2018-02-16 ENCOUNTER — Telehealth: Payer: Self-pay | Admitting: Internal Medicine

## 2018-02-16 ENCOUNTER — Ambulatory Visit (INDEPENDENT_AMBULATORY_CARE_PROVIDER_SITE_OTHER): Payer: Medicare Other

## 2018-02-16 VITALS — BP 140/73 | HR 77 | Ht 65.0 in | Wt 183.0 lb

## 2018-02-16 DIAGNOSIS — M25552 Pain in left hip: Secondary | ICD-10-CM | POA: Diagnosis not present

## 2018-02-16 DIAGNOSIS — M48062 Spinal stenosis, lumbar region with neurogenic claudication: Secondary | ICD-10-CM

## 2018-02-16 DIAGNOSIS — M5136 Other intervertebral disc degeneration, lumbar region: Secondary | ICD-10-CM | POA: Diagnosis not present

## 2018-02-16 DIAGNOSIS — M7062 Trochanteric bursitis, left hip: Secondary | ICD-10-CM

## 2018-02-16 DIAGNOSIS — M533 Sacrococcygeal disorders, not elsewhere classified: Secondary | ICD-10-CM

## 2018-02-16 DIAGNOSIS — K642 Third degree hemorrhoids: Secondary | ICD-10-CM

## 2018-02-16 DIAGNOSIS — S7002XS Contusion of left hip, sequela: Secondary | ICD-10-CM

## 2018-02-16 MED ORDER — CELECOXIB 200 MG PO CAPS: 200.0000 mg | ORAL_CAPSULE | Freq: Two times a day (BID) | ORAL | 2 refills | Status: DC

## 2018-02-16 MED ORDER — BACLOFEN 10 MG PO TABS: 10.0000 mg | ORAL_TABLET | Freq: Three times a day (TID) | ORAL | 0 refills | Status: DC

## 2018-02-16 NOTE — Progress Notes (Signed)
Office Visit Note   Patient: [name]           Date of Birth: 1960/07/19           MRN: [id number] Visit Date: 02/16/2018              Requested by: Guadalupe Dawn, MD [phone], [place] [address] PCP: Guadalupe Dawn, MD   Assessment & Plan: Visit Diagnoses:  1. Spinal stenosis of lumbar region with neurogenic claudication   2. Pain of left hip joint     Plan: Fall Prevention and Home Safety Falls cause injuries and can affect all age groups. It is possible to use preventive measures to significantly decrease the likelihood of falls. There are many simple measures which can make your home safer and prevent falls. OUTDOORS  Repair cracks and edges of walkways and driveways.  Remove high doorway thresholds.  Trim shrubbery on the main path into your home.  Have good outside lighting.  Clear walkways of tools, rocks, debris, and clutter.  Check that handrails are not broken and are securely fastened. Both sides of steps should have handrails.  Have leaves, snow, and ice cleared regularly.  Use sand or salt on walkways during winter months.  In the garage, clean up grease or oil spills. BATHROOM  Install night lights.  Install grab bars by the toilet and in the tub and shower.  Use non-skid mats or decals in the tub or shower.  Place a plastic non-slip stool in the shower to sit on, if needed.  Keep floors dry and clean up all water on the floor immediately.  Remove soap buildup in the tub or shower on a regular basis.  Secure bath mats with non-slip, double-sided rug tape.  Remove throw rugs and tripping hazards from the floors. BEDROOMS  Install night lights.  Make sure a bedside light is easy to reach.  Do not use oversized bedding.  Keep a telephone by your bedside.  Have a firm chair with side arms to use for getting dressed.  Remove throw rugs and tripping hazards from the floor. KITCHEN  Keep handles on pots and pans  turned toward the center of the stove. Use back burners when possible.  Clean up spills quickly and allow time for drying.  Avoid walking on wet floors.  Avoid hot utensils and knives.  Position shelves so they are not too high or low.  Place commonly used objects within easy reach.  If necessary, use a sturdy step stool with a grab bar when reaching.  Keep electrical cables out of the way.  Do not use floor polish or wax that makes floors slippery. If you must use wax, use non-skid floor wax.  Remove throw rugs and tripping hazards from the floor. STAIRWAYS  Never leave objects on stairs.  Place handrails on both sides of stairways and use them. Fix any loose handrails. Make sure handrails on both sides of the stairways are as long as the stairs.  Check carpeting to make sure it is firmly attached along stairs. Make repairs to worn or loose carpet promptly.  Avoid placing throw rugs at the top or bottom of stairways, or properly secure the rug with carpet tape to prevent slippage. Get rid of throw rugs, if possible.  Have an electrician put in a light switch at the top and bottom of the stairs. OTHER FALL PREVENTION TIPS  Wear low-heel or rubber-soled shoes that are supportive and fit well. Wear  closed toe shoes.  When using a stepladder, make sure it is fully opened and both spreaders are firmly locked. Do not climb a closed stepladder.  Add color or contrast paint or tape to grab bars and handrails in your home. Place contrasting color strips on first and last steps.  Learn and use mobility aids as needed. Install an electrical emergency response system.  Turn on lights to avoid dark areas. Replace light bulbs that burn out immediately. Get light switches that glow.  Arrange furniture to create clear pathways. Keep furniture in the same place.  Firmly attach carpet with non-skid or double-sided tape.  Eliminate uneven floor surfaces.  Select a carpet pattern that  does not visually hide the edge of steps.  Be aware of all pets. OTHER HOME SAFETY TIPS  Set the water temperature for 120 F (48.8 C).  Keep emergency numbers on or near the telephone.  Keep smoke detectors on every level of the home and near sleeping areas. Document Released: 12/25/2001 Document Revised: 07/06/2011 Document Reviewed: 03/26/2011 [facility] Patient Information 2014 [place]. Avoid bending, stooping and avoid lifting weights greater than 10 lbs. Avoid prolong standing and walking. Avoid frequent bending and stooping  No lifting greater than 10 lbs. May use ice or moist heat for pain. Weight loss is of benefit. Handicap license is approved. Dr. Romona Curls secretary/Assistant will call to arrange for left hip and sacroiliac marcaine and steroid injection  Celebrex 200 mg po BID   Follow-Up Instructions: No follow-ups on file.   Orders:  Orders Placed This Encounter  Procedures  . XR Lumbar Spine 2-3 Views  . XR HIP UNILAT W OR W/O PELVIS 2-3 VIEWS LEFT   No orders of the defined types were placed in this encounter.     Procedures: No procedures performed   Clinical Data: No additional findings.   Subjective: Chief Complaint  Patient presents with  . Lower Back - Pain, Injury    She has had numerous falls since last OV  . Left Hip - Pain, Injury    58 year old female with history of back and tailbone pain she has had previous lumbar. She has had a recent pneumonia with 5 falls in 11/2017. Underwent hospitalization in [facility] for 6 days. She is having persistent pain left lateral hip and buttock. Pain with standing and ambulation and with bending.    Review of Systems  Constitutional: Positive for chills, diaphoresis, fever and unexpected weight change.  HENT: Positive for congestion. Negative for dental problem, drooling, ear discharge, ear pain, facial swelling, hearing loss, mouth sores, nosebleeds, postnasal drip, rhinorrhea, sinus  pressure, sinus pain, sneezing, sore throat, trouble swallowing and voice change.   Respiratory: Positive for cough, shortness of breath and wheezing.   Cardiovascular: Negative for chest pain, palpitations and leg swelling.  Gastrointestinal: Positive for constipation. Negative for abdominal distention, abdominal pain, anal bleeding and blood in stool.  Endocrine: Negative for cold intolerance, heat intolerance, polydipsia, polyphagia and polyuria.  Genitourinary: Negative for difficulty urinating, dyspareunia, dysuria, enuresis, flank pain, frequency, genital sores and hematuria.  Musculoskeletal: Positive for back pain. Negative for arthralgias and gait problem.  Skin: Negative for color change, pallor, rash and wound.  Allergic/Immunologic: Negative for environmental allergies and food allergies.  Neurological: Negative for dizziness, tremors, seizures, syncope, facial asymmetry, speech difficulty, weakness, light-headedness, numbness and headaches.  Hematological: Negative for adenopathy. Does not bruise/bleed easily.  Psychiatric/Behavioral: Negative for agitation, behavioral problems, confusion, decreased concentration, dysphoric mood, hallucinations, sleep disturbance  and suicidal ideas. The patient is not nervous/anxious and is not hyperactive.      Objective: Vital Signs: There were no vitals taken for this visit.  Physical Exam Constitutional:      Appearance: She is well-developed.  HENT:     Head: Normocephalic and atraumatic.  Eyes:     Pupils: Pupils are equal, round, and reactive to light.  Neck:     Musculoskeletal: Normal range of motion and neck supple.  Pulmonary:     Effort: Pulmonary effort is normal.     Breath sounds: Normal breath sounds.  Abdominal:     General: Bowel sounds are normal.     Palpations: Abdomen is soft.  Skin:    General: Skin is warm and dry.  Neurological:     Mental Status: She is alert and oriented to person, place, and time.    Psychiatric:        Behavior: Behavior normal.        Thought Content: Thought content normal.        Judgment: Judgment normal.     Back Exam   Tenderness  The patient is experiencing tenderness in the lumbar and sacroiliac.  Range of Motion  Extension: abnormal  Flexion: abnormal  Lateral bend right: abnormal  Lateral bend left: abnormal  Rotation right: abnormal  Rotation left: abnormal   Muscle Strength  Right Quadriceps:  5/5  Left Quadriceps:  5/5  Right Hamstrings:  5/5  Left Hamstrings:  5/5   Tests  Straight leg raise right: negative Straight leg raise left: negative  Reflexes  Patellar: normal Achilles: normal Biceps: normal Babinski's sign: normal   Other  Toe walk: normal Heel walk: normal Sensation: normal Gait: antalgic  Erythema: no back redness Scars: absent  Comments:  Left hip decreased ER with stiffness. Tender left greater trochanter.      Specialty Comments:  No specialty comments available.  Imaging: No results found.   PMFS History: Patient Active Problem List   Diagnosis Date Noted  . Mass of left forearm 10/10/2015    Priority: High    Class: Chronic  . Carpal tunnel syndrome, right 07/11/2015    Priority: High    Class: Chronic  . Spondylolisthesis of lumbar region 01/03/2015    Priority: High    Class: Chronic  . Spinal stenosis, lumbar region, with neurogenic claudication 01/03/2015    Priority: High    Class: Chronic  . Soft tissue mass 07/23/2014    Priority: High    Class: Acute  . Hemorrhoid 02/15/2018  . Abdominal distention 02/15/2018  . Anemia 01/09/2018  . Pain of upper abdomen 01/09/2018  . Healthcare maintenance 12/30/2017  . Antibiotic-associated diarrhea 12/22/2017  . Vagina itching 12/22/2017  . Sepsis ([place])   . Respiratory distress   . Partial small bowel obstruction ([place]) 12/11/2017  . COPD with acute exacerbation ([place]) 12/11/2017  . Hypophosphatemia 12/11/2017  . Sepsis due to  undetermined organism ([place]) 12/10/2017  . AKI (acute kidney injury) ([place]) 12/10/2017  . Hyperbilirubinemia 12/10/2017  . Aortic regurgitation 10/25/2017  . Diastolic dysfunction 01/60/1093  . Dysphagia 01/20/2017  . Left hip pain 01/19/2017  . Right hand pain 01/19/2017  . Diarrhea 10/11/2016  . Right hip pain 06/07/2016  . Dysuria 04/14/2016  . Malaise and fatigue 04/14/2016  . Breast pain, left 11/06/2015  . Dysphagia, oropharyngeal   . Gastroesophageal reflux disease without esophagitis   . Prediabetes 07/09/2015  . Lumbar stenosis with neurogenic claudication 01/05/2015  . Coccygodynia  07/23/2014    Class: Acute  . Constipation 02/15/2014  . Dysphagia, pharyngoesophageal phase   . Elevated alkaline phosphatase level 11/06/2013  . Rectal bleeding 11/06/2013  . Obesity, unspecified 02/15/2013  . Genital herpes 10/12/2012  . HSV (herpes simplex virus) anogenital infection 09/29/2012  . UNSPECIFIED SLEEP APNEA 12/04/2008  . ADENOCARCINOMA, LUNG 10/09/2008  . Osteoarthritis of multiple joints 07/13/2006  . HYPERCHOLESTEROLEMIA 03/17/2006  . DEPRESSION, MAJOR, RECURRENT 03/17/2006  . HYPERTENSION, BENIGN SYSTEMIC 03/17/2006  . GASTROESOPHAGEAL REFLUX, NO ESOPHAGITIS 03/17/2006  . INSOMNIA NOS 03/17/2006   Past Medical History:  Diagnosis Date  . Anxiety   . Asthma    every now and then.  Wheezing and coughing  . Cancer ([employer]) 1997   left lung  . Chronic abdominal pain   . Chronic back pain   . Chronic joint pain   . Depression   . HLD (hyperlipidemia)   . Palpitations 01/2013   chronic, intermittent  . Shortness of breath dyspnea    with exertion    Family History  Problem Relation Age of Onset  . Diabetes Other   . Heart disease Other        has Psychologist, forensic  . Hypertension Mother   . Kidney disease Mother   . Hypertension Father   . Kidney disease Father   . Heart disease Father   . Cancer Father        leukemia  . Hypertension Sister   . Hypertension  Sister   . Colon cancer Neg Hx     Past Surgical History:  Procedure Laterality Date  . ABDOMINAL HYSTERECTOMY    . ABDOMINAL SURGERY     ovarian cyst removal  . BIOPSY N/A 11/22/2013   Procedure: GASTRIC BIOPSY;  Surgeon: Daneil Dolin, MD;  Location: [facility];  Service: Endoscopy;  Laterality: N/A;  . BLADDER SURGERY  suspension x4   x 4  . CARPAL TUNNEL RELEASE Right 07/11/2015   Procedure: RIGHT CARPAL TUNNEL RELEASE;  Surgeon: Jessy Oto, MD;  Location: [place];  Service: Orthopedics;  Laterality: Right;  . CHOLECYSTECTOMY N/A 04/05/2014   Procedure: LAPAROSCOPIC CHOLECYSTECTOMY;  Surgeon: Aviva Signs Md, MD;  Location: [facility];  Service: General;  Laterality: N/A;  . COCCYGECTOMY N/A 04/11/2015   Procedure: COCCYGECTOMY WITH OSTEOTOMY THROUGH AREA OF DEFORMITY;  Surgeon: Jessy Oto, MD;  Location: [place];  Service: Orthopedics;  Laterality: N/A;  . COLONOSCOPY    . COLONOSCOPY WITH PROPOFOL N/A 11/22/2013   Dr. Gala Romney: Grade 3 and 4/internal hemorrhoids?"likely source of hematochezia Normal colonoscopy otherwise.   Marland Kitchen COLONOSCOPY WITH PROPOFOL N/A 09/15/2017   Dr. Emerson Monte: Hemorrhoids, next colonoscopy 10 years  . ESOPHAGOGASTRODUODENOSCOPY (EGD) WITH PROPOFOL N/A 11/22/2013   Dr. Gala Romney: Incomplete Schatzki's ring dilated and disrupted as described above.  Small hiatal hernia. Focally abnormal gastric mucosa of uncertain significance status post biopsy, reactive gastropathy, negative H.pylori  . ESOPHAGOGASTRODUODENOSCOPY (EGD) WITH PROPOFOL N/A 08/04/2015   Dr. Gala Romney: mild Schatzki's ring s/p dilation, small hiatal hernia   . ESOPHAGOGASTRODUODENOSCOPY (EGD) WITH PROPOFOL N/A 03/17/2017   Mild Schatki's ring s/p dilation, small hiatal hernia, otherwise normal  . EXCISION VAGINAL CYST Left 06/20/2012   Procedure: EXCISION LEFT LABIAL CYST;  Surgeon: Jonnie Kind, MD;  Location: [facility];  Service: Gynecology;  Laterality: Left;  . LOBECTOMY Left   . LUNG CANCER SURGERY   1997   age 61, resection only  . MALONEY DILATION N/A 11/22/2013   Procedure: MALONEY DILATION;  Surgeon: Daneil Dolin, MD;  Location: [facility];  Service: Endoscopy;  Laterality: N/A;  56  . MALONEY DILATION N/A 08/04/2015   Procedure: [name] DILATION;  Surgeon: Daneil Dolin, MD;  [facility];  Service: Endoscopy;  Laterality: N/A;  [name] DILATION N/A 03/17/2017   Procedure: [name] DILATION;  Surgeon: Daneil Dolin, MD;  [facility];  Service: Endoscopy;  Laterality: N/A;  . MASS EXCISION N/A 07/23/2014   Procedure: EXCISIONAL BIOPSY NODULE LEFT PARACOCCYGEAL AREA;  Surgeon: Jessy Oto, MD;  Location: [place];  Service: Orthopedics;  Laterality: N/A;  . MASS EXCISION Left 10/10/2015   Procedure: EXCISIONAL BIOPSY OF LEFT DORSORADIAL FOREARM MASS LIPOMA;  Surgeon: Jessy Oto, MD;  Location: [place];  Service: Orthopedics;  Laterality: Left;  . TUBAL LIGATION     Social History   Occupational History  . Not on file  Tobacco Use  . Smoking status: Former Smoker    Packs/day: 1.50    Years: 25.00    Pack years: 37.50    Types: Cigarettes    Last attempt to quit: 04/07/1995    Years since quitting: 22.8  . Smokeless tobacco: Never Used  Substance and Sexual Activity  . Alcohol use: No  . Drug use: No  . Sexual activity: Not Currently    Birth control/protection: Surgical

## 2018-02-16 NOTE — Telephone Encounter (Signed)
PA for Linzess 290 mcg was submitted through [email or web address]. waiting on approval or denial.

## 2018-02-16 NOTE — Patient Instructions (Signed)
Plan: Fall Prevention and Home Safety Falls cause injuries and can affect all age groups. It is possible to use preventive measures to significantly decrease the likelihood of falls. There are many simple measures which can make your home safer and prevent falls. OUTDOORS  Repair cracks and edges of walkways and driveways.  Remove high doorway thresholds.  Trim shrubbery on the main path into your home.  Have good outside lighting.  Clear walkways of tools, rocks, debris, and clutter.  Check that handrails are not broken and are securely fastened. Both sides of steps should have handrails.  Have leaves, snow, and ice cleared regularly.  Use sand or salt on walkways during winter months.  In the garage, clean up grease or oil spills. BATHROOM  Install night lights.  Install grab bars by the toilet and in the tub and shower.  Use non-skid mats or decals in the tub or shower.  Place a plastic non-slip stool in the shower to sit on, if needed.  Keep floors dry and clean up all water on the floor immediately.  Remove soap buildup in the tub or shower on a regular basis.  Secure bath mats with non-slip, double-sided rug tape.  Remove throw rugs and tripping hazards from the floors. BEDROOMS  Install night lights.  Make sure a bedside light is easy to reach.  Do not use oversized bedding.  Keep a telephone by your bedside.  Have a firm chair with side arms to use for getting dressed.  Remove throw rugs and tripping hazards from the floor. KITCHEN  Keep handles on pots and pans turned toward the center of the stove. Use back burners when possible.  Clean up spills quickly and allow time for drying.  Avoid walking on wet floors.  Avoid hot utensils and knives.  Position shelves so they are not too high or low.  Place commonly used objects within easy reach.  If necessary, use a sturdy step stool with a grab bar when reaching.  Keep electrical cables out of  the way.  Do not use floor polish or wax that makes floors slippery. If you must use wax, use non-skid floor wax.  Remove throw rugs and tripping hazards from the floor. STAIRWAYS  Never leave objects on stairs.  Place handrails on both sides of stairways and use them. Fix any loose handrails. Make sure handrails on both sides of the stairways are as long as the stairs.  Check carpeting to make sure it is firmly attached along stairs. Make repairs to worn or loose carpet promptly.  Avoid placing throw rugs at the top or bottom of stairways, or properly secure the rug with carpet tape to prevent slippage. Get rid of throw rugs, if possible.  Have an electrician put in a light switch at the top and bottom of the stairs. OTHER FALL PREVENTION TIPS  Wear low-heel or rubber-soled shoes that are supportive and fit well. Wear closed toe shoes.  When using a stepladder, make sure it is fully opened and both spreaders are firmly locked. Do not climb a closed stepladder.  Add color or contrast paint or tape to grab bars and handrails in your home. Place contrasting color strips on first and last steps.  Learn and use mobility aids as needed. Install an electrical emergency response system.  Turn on lights to avoid dark areas. Replace light bulbs that burn out immediately. Get light switches that glow.  Arrange furniture to create clear pathways. Keep furniture in the same  place.  Firmly attach carpet with non-skid or double-sided tape.  Eliminate uneven floor surfaces.  Select a carpet pattern that does not visually hide the edge of steps.  Be aware of all pets. OTHER HOME SAFETY TIPS  Set the water temperature for 120 F (48.8 C).  Keep emergency numbers on or near the telephone.  Keep smoke detectors on every level of the home and near sleeping areas. Document Released: 12/25/2001 Document Revised: 07/06/2011 Document Reviewed: 03/26/2011 [facility] Patient Information 2014  [place]. Avoid bending, stooping and avoid lifting weights greater than 10 lbs. Avoid prolong standing and walking. Avoid frequent bending and stooping  No lifting greater than 10 lbs. May use ice or moist heat for pain. Weight loss is of benefit. Handicap license is approved. Dr. Romona Curls secretary/Assistant will call to arrange for left hip and sacroiliac marcaine and steroid injection  Celebrex 200 mg po BID

## 2018-02-16 NOTE — Telephone Encounter (Signed)
Patient was seen in the office yesterday and called today to say that she wanted to be referred to a surgeon for her hemorrhoids. Please call [phone]

## 2018-02-16 NOTE — Telephone Encounter (Signed)
Ok to refer for hemorrhoid surgery  Cancel appt for hemorrhoid banding with AB

## 2018-02-16 NOTE — Telephone Encounter (Signed)
PA for [address] has been approved through 01/18/2019. Pt is aware of approval.

## 2018-02-17 ENCOUNTER — Encounter: Payer: Self-pay | Admitting: Cardiology

## 2018-02-17 ENCOUNTER — Ambulatory Visit (INDEPENDENT_AMBULATORY_CARE_PROVIDER_SITE_OTHER): Payer: Medicare Other | Admitting: Cardiology

## 2018-02-17 ENCOUNTER — Ambulatory Visit (INDEPENDENT_AMBULATORY_CARE_PROVIDER_SITE_OTHER): Payer: Medicare Other

## 2018-02-17 VITALS — BP 142/60 | HR 86 | Ht 65.0 in | Wt 183.0 lb

## 2018-02-17 DIAGNOSIS — I5189 Other ill-defined heart diseases: Secondary | ICD-10-CM

## 2018-02-17 DIAGNOSIS — I351 Nonrheumatic aortic (valve) insufficiency: Secondary | ICD-10-CM

## 2018-02-17 DIAGNOSIS — R55 Syncope and collapse: Secondary | ICD-10-CM

## 2018-02-17 DIAGNOSIS — R002 Palpitations: Secondary | ICD-10-CM | POA: Diagnosis not present

## 2018-02-17 NOTE — Addendum Note (Signed)
Addended by: Inge Rise on: 02/17/2018 07:45 AM   Modules accepted: Orders

## 2018-02-17 NOTE — Telephone Encounter (Signed)
Patient aware and referral sent

## 2018-02-17 NOTE — Patient Instructions (Signed)
Medication Instructions:  Your physician recommends that you continue on your current medications as directed. Please refer to the Current Medication list given to you today.  If you need a refill on your cardiac medications before your next appointment, please call your pharmacy.   Lab work: NONE  If you have labs (blood work) drawn today and your tests are completely normal, you will receive your results only by: Marland Kitchen MyChart Message (if you have MyChart) OR . A paper copy in the mail If you have any lab test that is abnormal or we need to change your treatment, we will call you to review the results.  Testing/Procedures: Your physician has recommended that you wear an event monitor. Event monitors are medical devices that record the heart's electrical activity. Doctors most often [place] these monitors to diagnose arrhythmias. Arrhythmias are problems with the speed or rhythm of the heartbeat. The monitor is a small, portable device. You can wear one while you do your normal daily activities. This is usually used to diagnose what is causing palpitations/syncope (passing out).    Follow-Up: At [facility], you and your health needs are our priority.  As part of our continuing mission to provide you with exceptional heart care, we have created designated Provider Care Teams.  These Care Teams include your primary Cardiologist (physician) and Advanced Practice Providers (APPs -  Physician Assistants and Nurse Practitioners) who all work together to provide you with the care you need, when you need it. You will need a follow up appointment in 6 weeks.  Please call our office 2 months in advance to schedule this appointment.  You may see Carlyle Dolly, MD or one of the following Advanced Practice Providers on your designated Care Team:   Bernerd Pho, PA-C [facility]) . Ermalinda Barrios, PA-C ([place])  Any Other Special Instructions Will Be Listed Below (If Applicable). Thank  you for choosing [facility]!

## 2018-02-17 NOTE — Progress Notes (Signed)
Clinical Summary [name] is a 58 y.o.female seen today as a new consult, referred by Dr Owens Shark for syncope.   1. Syncope 10/2017 echo LVEF 60-65%, grade II diastoilc dysfunction, - 10/2017 holter: PACs, short runs atach. No sustained arrhythmias  - recent episodes started in 10/2017 - last episode 3 weeks ago at home watching tv. Mild lightheadedness before, next thing she knew her husband was waking her up - her sister recalls an episode while watching tv while and talking to family. No prodrome, just slumped down in chair. Out for 5-6 minutes.  - has had some prior episodes with falls.  - episodes have not resolved since completing treatment for severe pneumonia in November   2. Diastolic dysfunction - 12/4578 echo LVEF 60-65%, grade II diastolic dysfunction - no significant edema, no SOB/DOE  3. Aortic regurgitation - 10/2017 moderate AI  4. Pneumonia - admission 11/2017  5. Palpitations - 1-2 episodes per week. Short in duration.   Past Medical History:  Diagnosis Date  . Anxiety   . Asthma    every now and then.  Wheezing and coughing  . Cancer ([place]) 1997   left lung  . Chronic abdominal pain   . Chronic back pain   . Chronic joint pain   . Depression   . HLD (hyperlipidemia)   . Palpitations 01/2013   chronic, intermittent  . Shortness of breath dyspnea    with exertion     Allergies  Allergen Reactions  . Promethazine Hcl Nausea And Vomiting and Other (See Comments)    "Makes my head hurt really bad too"     Current Outpatient Medications  Medication Sig Dispense Refill  . acyclovir (ZOVIRAX) 400 MG tablet TAKE 1 TABLET BY MOUTH TWICE DAILY (Patient taking differently: Take 400 mg by mouth 2 (two) times daily. ) 180 tablet 3  . albuterol (PROAIR HFA) 108 (90 Base) MCG/ACT inhaler Inhale 2 puffs into the lungs every 6 (six) hours as needed for wheezing or shortness of breath. 1 Inhaler 2  . amitriptyline (ELAVIL) 150 MG tablet TAKE 1 TABLET BY  MOUTH ONCE DAILY AT NIGHT 30 tablet 5  . amLODipine (NORVASC) 10 MG tablet Take 1 tablet (10 mg total) by mouth daily. 90 tablet 3  . atorvastatin (LIPITOR) 40 MG tablet Take 1 tablet (40 mg total) by mouth daily. 90 tablet 3  . baclofen (LIORESAL) 10 MG tablet Take 1 tablet (10 mg total) by mouth 3 (three) times daily. 30 each 0  . budesonide-formoterol (SYMBICORT) 160-4.5 MCG/ACT inhaler Inhale 2 puffs into the lungs 2 (two) times daily. 1 Inhaler 3  . celecoxib (CELEBREX) 200 MG capsule Take 1 capsule (200 mg total) by mouth 2 (two) times daily. 60 capsule 2  . dexlansoprazole (DEXILANT) 60 MG capsule Take 1 capsule (60 mg total) by mouth daily before breakfast. 90 capsule 3  . DULoxetine (CYMBALTA) 60 MG capsule Take 1 capsule (60 mg total) by mouth 2 (two) times daily. 120 capsule 11  . guaiFENesin (MUCINEX) 600 MG 12 hr tablet Take 1 tablet (600 mg total) by mouth 2 (two) times daily. 40 tablet 0  . linaclotide (LINZESS) 290 MCG CAPS capsule Take 1 capsule (290 mcg total) by mouth daily before breakfast. 30 capsule 11  . pregabalin (LYRICA) 100 MG capsule Take 1 capsule (100 mg total) by mouth 3 (three) times daily. 90 capsule 2   No current facility-administered medications for this visit.      Past Surgical  History:  Procedure Laterality Date  . ABDOMINAL HYSTERECTOMY    . ABDOMINAL SURGERY     ovarian cyst removal  . BIOPSY N/A 11/22/2013   Procedure: GASTRIC BIOPSY;  Surgeon: Daneil Dolin, MD;  Location: [facility];  Service: Endoscopy;  Laterality: N/A;  . BLADDER SURGERY  suspension x4   x 4  . CARPAL TUNNEL RELEASE Right 07/11/2015   Procedure: RIGHT CARPAL TUNNEL RELEASE;  Surgeon: Jessy Oto, MD;  Location: [facility];  Service: Orthopedics;  Laterality: Right;  . CHOLECYSTECTOMY N/A 04/05/2014   Procedure: LAPAROSCOPIC CHOLECYSTECTOMY;  Surgeon: Aviva Signs Md, MD;  Location: [facility];  Service: General;  Laterality: N/A;  . COCCYGECTOMY N/A 04/11/2015    Procedure: COCCYGECTOMY WITH OSTEOTOMY THROUGH AREA OF DEFORMITY;  Surgeon: Jessy Oto, MD;  Location: [facility];  Service: Orthopedics;  Laterality: N/A;  . COLONOSCOPY    . COLONOSCOPY WITH PROPOFOL N/A 11/22/2013   Dr. Gala Romney: Grade 3 and 4/internal hemorrhoids?"likely source of hematochezia Normal colonoscopy otherwise.   Marland Kitchen COLONOSCOPY WITH PROPOFOL N/A 09/15/2017   Dr. Emerson Monte: Hemorrhoids, next colonoscopy 10 years  . ESOPHAGOGASTRODUODENOSCOPY (EGD) WITH PROPOFOL N/A 11/22/2013   Dr. Gala Romney: Incomplete Schatzki's ring dilated and disrupted as described above.  Small hiatal hernia. Focally abnormal gastric mucosa of uncertain significance status post biopsy, reactive gastropathy, negative H.pylori  . ESOPHAGOGASTRODUODENOSCOPY (EGD) WITH PROPOFOL N/A 08/04/2015   Dr. Gala Romney: mild Schatzki's ring s/p dilation, small hiatal hernia   . ESOPHAGOGASTRODUODENOSCOPY (EGD) WITH PROPOFOL N/A 03/17/2017   Mild Schatki's ring s/p dilation, small hiatal hernia, otherwise normal  . EXCISION VAGINAL CYST Left 06/20/2012   Procedure: EXCISION LEFT LABIAL CYST;  Surgeon: Jonnie Kind, MD;  Location: [facility];  Service: Gynecology;  Laterality: Left;  . LOBECTOMY Left   . LUNG CANCER SURGERY  1997   age 55, resection only  . MALONEY DILATION N/A 11/22/2013   Procedure: [name] DILATION;  Surgeon: Daneil Dolin, MD;  [facility];  Service: Endoscopy;  Laterality: N/A;  56  . MALONEY DILATION N/A 08/04/2015   Procedure: [name] DILATION;  Surgeon: Daneil Dolin, MD;  [facility];  Service: Endoscopy;  Laterality: N/A;  [name] DILATION N/A 03/17/2017   Procedure: [name] DILATION;  Surgeon: Daneil Dolin, MD;  [facility];  Service: Endoscopy;  Laterality: N/A;  . MASS EXCISION N/A 07/23/2014   Procedure: EXCISIONAL BIOPSY NODULE LEFT PARACOCCYGEAL AREA;  Surgeon: Jessy Oto, MD;  Location: [facility];  Service: Orthopedics;  Laterality: N/A;  . MASS EXCISION Left 10/10/2015    Procedure: EXCISIONAL BIOPSY OF LEFT DORSORADIAL FOREARM MASS LIPOMA;  Surgeon: Jessy Oto, MD;  Location: [facility];  Service: Orthopedics;  Laterality: Left;  . TUBAL LIGATION       Allergies  Allergen Reactions  . Promethazine Hcl Nausea And Vomiting and Other (See Comments)    "Makes my head hurt really bad too"      Family History  Problem Relation Age of Onset  . Diabetes Other   . Heart disease Other        has Psychologist, forensic  . Hypertension Mother   . Kidney disease Mother   . Hypertension Father   . Kidney disease Father   . Heart disease Father   . Cancer Father        leukemia  . Hypertension Sister   . Hypertension Sister   . Colon cancer Neg Hx  Social History [name] reports that she quit smoking about 22 years ago. Her smoking use included cigarettes. She has a 37.50 pack-year smoking history. She has never used smokeless tobacco. [name] reports no history of alcohol use.   Review of Systems CONSTITUTIONAL: No weight loss, fever, chills, weakness or fatigue.  HEENT: Eyes: No visual loss, blurred vision, double vision or yellow sclerae.No hearing loss, sneezing, congestion, runny nose or sore throat.  SKIN: No rash or itching.  CARDIOVASCULAR: per hpi RESPIRATORY: No shortness of breath, cough or sputum.  GASTROINTESTINAL: No anorexia, nausea, vomiting or diarrhea. No abdominal pain or blood.  GENITOURINARY: No burning on urination, no polyuria NEUROLOGICAL: per hpi MUSCULOSKELETAL: No muscle, back pain, joint pain or stiffness.  LYMPHATICS: No enlarged nodes. No history of splenectomy.  PSYCHIATRIC: No history of depression or anxiety.  ENDOCRINOLOGIC: No reports of sweating, cold or heat intolerance. No polyuria or polydipsia.  Marland Kitchen   Physical Examination Vitals:   02/17/18 0836 02/17/18 0837  BP: 140/70 (!) 142/60  Pulse: 86   SpO2: 97%    Vitals:   02/17/18 0836  Weight: 183 lb (83 kg)  Height: 5\' 5"  (1.651 m)     Gen: resting comfortably, no acute distress HEENT: no scleral icterus, pupils equal round and reactive, no palptable cervical adenopathy,  CV: RRR, no m/r/g, no jvd Resp: Clear to auscultation bilaterally GI: abdomen is soft, non-tender, non-distended, normal bowel sounds, no hepatosplenomegaly MSK: extremities are warm, no edema.  Skin: warm, no rash Neuro:  no focal deficits Psych: appropriate affect     Assessment and Plan  1. Syncope - unclear etiology - no significant structural heart disease by echo as cause - prior 48 hr holter monitor was benign, we will plan for extended 30 day event monitor - if negative monitor would consider neuro referral  - orthostatics are abnormal in clinic, SBP drops 26 points with position change. Orthostatic syncope does not quite fit her history, look for alternative causes. Encouraged increaed hydration.   2. Aortic regurgitation - moderate, repeat echo in 3 years or if new symptoms  3. Diastolic dysfunction - no significant symptoms, continue bp control    F/u 6 weeks  Arnoldo Lenis, M.D.

## 2018-02-22 ENCOUNTER — Ambulatory Visit (HOSPITAL_COMMUNITY)
Admission: RE | Admit: 2018-02-22 | Discharge: 2018-02-22 | Disposition: A | Discharge status: Home / Self Care | Payer: Medicare Other | Source: Ambulatory Visit | Attending: Gastroenterology | Admitting: Gastroenterology

## 2018-02-22 ENCOUNTER — Other Ambulatory Visit (HOSPITAL_COMMUNITY)
Admission: RE | Admit: 2018-02-22 | Discharge: 2018-02-22 | Disposition: A | Discharge status: Home / Self Care | Payer: Medicare Other | Source: Ambulatory Visit | Attending: Gastroenterology | Admitting: Gastroenterology

## 2018-02-22 ENCOUNTER — Telehealth: Payer: Self-pay | Admitting: Internal Medicine

## 2018-02-22 DIAGNOSIS — K625 Hemorrhage of anus and rectum: Secondary | ICD-10-CM

## 2018-02-22 DIAGNOSIS — K642 Third degree hemorrhoids: Secondary | ICD-10-CM

## 2018-02-22 DIAGNOSIS — R140 Abdominal distension (gaseous): Secondary | ICD-10-CM

## 2018-02-22 DIAGNOSIS — K5900 Constipation, unspecified: Secondary | ICD-10-CM | POA: Diagnosis not present

## 2018-02-22 LAB — COMPREHENSIVE METABOLIC PANEL
ALBUMIN: 3.8 g/dL (ref 3.5–5.0)
ALT: 17 U/L (ref 0–44)
AST: 16 U/L (ref 15–41)
Alkaline Phosphatase: 129 U/L — ABNORMAL HIGH (ref 38–126)
Anion gap: 9 (ref 5–15)
BILIRUBIN TOTAL: 0.3 mg/dL (ref 0.3–1.2)
BUN: 8 mg/dL (ref 6–20)
CO2: 27 mmol/L (ref 22–32)
Calcium: 8.6 mg/dL — ABNORMAL LOW (ref 8.9–10.3)
Chloride: 103 mmol/L (ref 98–111)
Creatinine, Ser: 0.72 mg/dL (ref 0.44–1.00)
GFR calc Af Amer: >60 mL/min (ref >60)
GFR calc non Af Amer: >60 mL/min (ref >60)
Glucose, Bld: 102 mg/dL — ABNORMAL HIGH (ref 70–99)
Potassium: 3.3 mmol/L — ABNORMAL LOW (ref 3.5–5.1)
SODIUM: 139 mmol/L (ref 135–145)
Total Protein: 7.5 g/dL (ref 6.5–8.1)

## 2018-02-22 LAB — CBC WITH DIFFERENTIAL/PLATELET
ABS IMMATURE GRANULOCYTES: 0.01 10*3/uL (ref 0.00–0.07)
Basophils Absolute: 0 10*3/uL (ref 0.0–0.1)
Basophils Relative: 1 %
Eosinophils Absolute: 0.3 10*3/uL (ref 0.0–0.5)
Eosinophils Relative: 5 %
HCT: 39.4 % (ref 36.0–46.0)
Hemoglobin: 12.4 g/dL (ref 12.0–15.0)
IMMATURE GRANULOCYTES: 0 %
Lymphocytes Relative: 36 %
Lymphs Abs: 1.9 10*3/uL (ref 0.7–4.0)
MCH: 29 pg (ref 26.0–34.0)
MCHC: 31.5 g/dL (ref 30.0–36.0)
MCV: 92.1 fL (ref 80.0–100.0)
Monocytes Absolute: 0.3 10*3/uL (ref 0.1–1.0)
Monocytes Relative: 6 %
NEUTROS PCT: 52 %
Neutro Abs: 2.6 10*3/uL (ref 1.7–7.7)
Platelets: 263 10*3/uL (ref 150–400)
RBC: 4.28 MIL/uL (ref 3.87–5.11)
RDW: 12.8 % (ref 11.5–15.5)
WBC: 5.1 10*3/uL (ref 4.0–10.5)
nRBC: 0 % (ref 0.0–0.2)

## 2018-02-22 NOTE — Telephone Encounter (Signed)
Referral was sent to Dr. Arnoldo Morale office 02/17/18. Called Dr. Arnoldo Morale office, they will be contacting pt for appt. Called and informed pt.

## 2018-02-22 NOTE — Telephone Encounter (Signed)
Pt said we were suppose to be referring her to Dr Arnoldo Morale. She called Dr Arnoldo Morale office and they told her they haven't received referral. Pt is asking for referral to be sent ASAP. [phone]

## 2018-02-24 ENCOUNTER — Ambulatory Visit (INDEPENDENT_AMBULATORY_CARE_PROVIDER_SITE_OTHER): Payer: Self-pay | Admitting: Physical Medicine and Rehabilitation

## 2018-02-27 ENCOUNTER — Other Ambulatory Visit: Payer: Self-pay

## 2018-02-27 ENCOUNTER — Telehealth: Payer: Self-pay | Admitting: Cardiology

## 2018-02-27 DIAGNOSIS — E876 Hypokalemia: Secondary | ICD-10-CM

## 2018-02-27 MED ORDER — POTASSIUM CHLORIDE 20 MEQ PO PACK: 20.0000 meq | PACK | Freq: Two times a day (BID) | ORAL | 0 refills | Status: DC

## 2018-02-27 NOTE — Telephone Encounter (Signed)
Patient contacted and advised to contact [employer] and ask for sensitive skin adhesives before sending monitor back. Patient verbalized understanding.

## 2018-02-27 NOTE — Telephone Encounter (Signed)
Pt had a reaction to her monitor stickers. She's going to send it back.

## 2018-02-28 ENCOUNTER — Telehealth: Payer: Self-pay

## 2018-02-28 NOTE — Telephone Encounter (Signed)
Pt mailed back event monitor as it was itching her.[employer] told her they would mail her another monitor. She has not mailed first monitor back yet.States she will today.

## 2018-03-02 ENCOUNTER — Encounter: Payer: Self-pay | Admitting: General Surgery

## 2018-03-02 ENCOUNTER — Ambulatory Visit (INDEPENDENT_AMBULATORY_CARE_PROVIDER_SITE_OTHER): Payer: Medicare Other | Admitting: General Surgery

## 2018-03-02 VITALS — BP 157/85 | HR 91 | Temp 97.8°F | Resp 18 | Wt 188.6 lb

## 2018-03-02 DIAGNOSIS — K649 Unspecified hemorrhoids: Secondary | ICD-10-CM

## 2018-03-02 NOTE — Progress Notes (Signed)
[name]; [id number]; [birth year]   HPI Patient is a 58 year old black female who was referred to my care by Dr. Gala Romney for evaluation treatment of bleeding hemorrhoids.  Patient states that her last episode of bright red blood per rectum was last week.  She was just started on Linzess for chronic constipation.  She has not had any bleeding episodes with a few of her last bowel movements.  She currently has no rectal pain.  She states her bowel movements have become softer.  She has 0 out of 10 rectal pain.  She was offered banding by GI, but has refused.  She is being worked up for dizzy spells, but cardiology has not found any etiology.  She states she has not had any episodes recently. Past Medical History:  Diagnosis Date  . Anxiety   . Asthma    every now and then.  Wheezing and coughing  . Cancer ([place]) 1997   left lung  . Chronic abdominal pain   . Chronic back pain   . Chronic joint pain   . Depression   . HLD (hyperlipidemia)   . Palpitations 01/2013   chronic, intermittent  . Shortness of breath dyspnea    with exertion    Past Surgical History:  Procedure Laterality Date  . ABDOMINAL HYSTERECTOMY    . ABDOMINAL SURGERY     ovarian cyst removal  . BIOPSY N/A 11/22/2013   Procedure: GASTRIC BIOPSY;  Surgeon: Daneil Dolin, MD;  Location: [facility];  Service: Endoscopy;  Laterality: N/A;  . BLADDER SURGERY  suspension x4   x 4  . CARPAL TUNNEL RELEASE Right 07/11/2015   Procedure: RIGHT CARPAL TUNNEL RELEASE;  Surgeon: Jessy Oto, MD;  Location: [facility];  Service: Orthopedics;  Laterality: Right;  . CHOLECYSTECTOMY N/A 04/05/2014   Procedure: LAPAROSCOPIC CHOLECYSTECTOMY;  Surgeon: Aviva Signs Md, MD;  Location: [facility];  Service: General;  Laterality: N/A;  . COCCYGECTOMY N/A 04/11/2015   Procedure: COCCYGECTOMY WITH OSTEOTOMY THROUGH AREA OF DEFORMITY;  Surgeon: Jessy Oto, MD;  Location: [facility];  Service: Orthopedics;  Laterality: N/A;  . COLONOSCOPY     . COLONOSCOPY WITH PROPOFOL N/A 11/22/2013   Dr. Gala Romney: Grade 3 and 4/internal hemorrhoids?"likely source of hematochezia Normal colonoscopy otherwise.   Marland Kitchen COLONOSCOPY WITH PROPOFOL N/A 09/15/2017   Dr. Emerson Monte: Hemorrhoids, next colonoscopy 10 years  . ESOPHAGOGASTRODUODENOSCOPY (EGD) WITH PROPOFOL N/A 11/22/2013   Dr. Gala Romney: Incomplete Schatzki's ring dilated and disrupted as described above.  Small hiatal hernia. Focally abnormal gastric mucosa of uncertain significance status post biopsy, reactive gastropathy, negative H.pylori  . ESOPHAGOGASTRODUODENOSCOPY (EGD) WITH PROPOFOL N/A 08/04/2015   Dr. Gala Romney: mild Schatzki's ring s/p dilation, small hiatal hernia   . ESOPHAGOGASTRODUODENOSCOPY (EGD) WITH PROPOFOL N/A 03/17/2017   Mild Schatki's ring s/p dilation, small hiatal hernia, otherwise normal  . EXCISION VAGINAL CYST Left 06/20/2012   Procedure: EXCISION LEFT LABIAL CYST;  Surgeon: Jonnie Kind, MD;  Location: [facility];  Service: Gynecology;  Laterality: Left;  . LOBECTOMY Left   . LUNG CANCER SURGERY  1997   age 68, resection only  . MALONEY DILATION N/A 11/22/2013   Procedure: [name] DILATION;  Surgeon: Daneil Dolin, MD;  Location: [facility];  Service: Endoscopy;  Laterality: N/A;  56  . MALONEY DILATION N/A 08/04/2015   Procedure: [name] DILATION;  Surgeon: Daneil Dolin, MD;  [facility];  Service: Endoscopy;  Laterality: N/A;  . MALONEY DILATION N/A 03/17/2017  Procedure: MALONEY DILATION;  Surgeon: Daneil Dolin, MD;  [facility];  Service: Endoscopy;  Laterality: N/A;  . MASS EXCISION N/A 07/23/2014   Procedure: EXCISIONAL BIOPSY NODULE LEFT PARACOCCYGEAL AREA;  Surgeon: Jessy Oto, MD;  Location: [facility];  Service: Orthopedics;  Laterality: N/A;  . MASS EXCISION Left 10/10/2015   Procedure: EXCISIONAL BIOPSY OF LEFT DORSORADIAL FOREARM MASS LIPOMA;  Surgeon: Jessy Oto, MD;  Location: [facility];  Service: Orthopedics;  Laterality: Left;   . TUBAL LIGATION      Family History  Problem Relation Age of Onset  . Diabetes Other   . Heart disease Other        has Psychologist, forensic  . Hypertension Mother   . Kidney disease Mother   . Hypertension Father   . Kidney disease Father   . Heart disease Father   . Cancer Father        leukemia  . Hypertension Sister   . Hypertension Sister   . Colon cancer Neg Hx     Current Outpatient Medications on File Prior to Visit  Medication Sig Dispense Refill  . acyclovir (ZOVIRAX) 400 MG tablet TAKE 1 TABLET BY MOUTH TWICE DAILY (Patient taking differently: Take 400 mg by mouth 2 (two) times daily. ) 180 tablet 3  . albuterol (PROAIR HFA) 108 (90 Base) MCG/ACT inhaler Inhale 2 puffs into the lungs every 6 (six) hours as needed for wheezing or shortness of breath. 1 Inhaler 2  . amitriptyline (ELAVIL) 150 MG tablet TAKE 1 TABLET BY MOUTH ONCE DAILY AT NIGHT 30 tablet 5  . amLODipine (NORVASC) 10 MG tablet Take 1 tablet (10 mg total) by mouth daily. 90 tablet 3  . atorvastatin (LIPITOR) 40 MG tablet Take 1 tablet (40 mg total) by mouth daily. 90 tablet 3  . baclofen (LIORESAL) 10 MG tablet Take 1 tablet (10 mg total) by mouth 3 (three) times daily. 30 each 0  . budesonide-formoterol (SYMBICORT) 160-4.5 MCG/ACT inhaler Inhale 2 puffs into the lungs 2 (two) times daily. 1 Inhaler 3  . celecoxib (CELEBREX) 200 MG capsule Take 1 capsule (200 mg total) by mouth 2 (two) times daily. 60 capsule 2  . dexlansoprazole (DEXILANT) 60 MG capsule Take 1 capsule (60 mg total) by mouth daily before breakfast. 90 capsule 3  . DULoxetine (CYMBALTA) 60 MG capsule Take 1 capsule (60 mg total) by mouth 2 (two) times daily. 120 capsule 11  . linaclotide (LINZESS) 290 MCG CAPS capsule Take 1 capsule (290 mcg total) by mouth daily before breakfast. 30 capsule 11  . potassium chloride (KLOR-CON) 20 MEQ packet Take 20 mEq by mouth 2 (two) times daily. 6 tablet 0   No current facility-administered medications on file  prior to visit.     Allergies  Allergen Reactions  . Promethazine Hcl Nausea And Vomiting and Other (See Comments)    "Makes my head hurt really bad too"    Social History   Substance and Sexual Activity  Alcohol Use No    Social History   Tobacco Use  Smoking Status Former Smoker  . Packs/day: 1.50  . Years: 25.00  . Pack years: 37.50  . Types: Cigarettes  . Last attempt to quit: 04/07/1995  . Years since quitting: 22.9  Smokeless Tobacco Never Used    Review of Systems  Constitutional: Positive for malaise/fatigue.  HENT: Negative.   Eyes: Negative.   Respiratory: Negative.   Cardiovascular: Negative.   Gastrointestinal: Negative.  Genitourinary: Negative.   Musculoskeletal: Positive for back pain and joint pain.  Skin: Negative.   Neurological: Positive for dizziness.  Endo/Heme/Allergies: Negative.   Psychiatric/Behavioral: Negative.     Objective   Vitals:   03/02/18 0848  BP: (!) 157/85  Pulse: 91  Resp: 18  Temp: 97.8 F (36.6 C)    Physical Exam Vitals signs reviewed.  Constitutional:      Appearance: Normal appearance. She is obese. She is not ill-appearing.  HENT:     Head: Normocephalic and atraumatic.  Cardiovascular:     Rate and Rhythm: Normal rate and regular rhythm.     Heart sounds: Normal heart sounds. No murmur. No friction rub. No gallop.   Pulmonary:     Effort: Pulmonary effort is normal. No respiratory distress.     Breath sounds: Normal breath sounds. No stridor. No wheezing, rhonchi or rales.  Genitourinary:    Comments: Small external hemorrhoid noted along the left aspect of the anus.  No significant internal hemorrhoidal disease was palpated.  Sphincter tone normal.  No anal fissure.  No blood noted. Skin:    General: Skin is warm and dry.  Neurological:     Mental Status: She is alert and oriented to person, place, and time.    Dr. Roseanne Kaufman notes reviewed Assessment  History of bleeding hemorrhoids, currently not  active.  Chronic constipation which is being addressed. Plan   I told the patient that she did not need hemorrhoidectomy at this time.  As she is just started Linzess, will wait to see if this is helpful in avoiding any further hemorrhoidal bleeding.  She was instructed not to strain when she moves her bowels.  She will follow-up with me should her bleeding recur.

## 2018-03-02 NOTE — Patient Instructions (Signed)

## 2018-03-03 ENCOUNTER — Other Ambulatory Visit: Payer: Self-pay

## 2018-03-03 ENCOUNTER — Telehealth: Payer: Self-pay

## 2018-03-03 MED ORDER — POTASSIUM CHLORIDE 20 MEQ PO PACK: 20.0000 meq | PACK | Freq: Two times a day (BID) | ORAL | 0 refills | Status: DC

## 2018-03-03 NOTE — Telephone Encounter (Signed)
Spoke with pt. Rx sent to pharmacy.

## 2018-03-03 NOTE — Telephone Encounter (Signed)
Patient called and said she got the potassium prescription but it has to be mixed with water and she stated she cant drink that, she wants a pill

## 2018-03-08 ENCOUNTER — Telehealth: Payer: Self-pay | Admitting: Internal Medicine

## 2018-03-08 NOTE — Telephone Encounter (Signed)
Pt called to say that she needed a pill for her potassium and not a powder. I told her something had been called in on 03/03/2018 Please call her at [phone]

## 2018-03-08 NOTE — Telephone Encounter (Signed)
Spoke with pt and called the pharmacy, they will make sure tablets are dispensed instead of the powder of potassium chloride.

## 2018-03-16 ENCOUNTER — Telehealth: Payer: Self-pay | Admitting: Cardiology

## 2018-03-16 NOTE — Telephone Encounter (Signed)
Had problems with her first monitor, wants to know when she needs to take the new one off. She has apt scheduled for 3/13

## 2018-03-16 NOTE — Telephone Encounter (Signed)
Pt informed she can take monitor off on Sunday 03/19/2018.

## 2018-03-22 ENCOUNTER — Other Ambulatory Visit: Payer: Self-pay | Admitting: Family Medicine

## 2018-03-23 ENCOUNTER — Other Ambulatory Visit: Payer: Self-pay

## 2018-03-23 DIAGNOSIS — E7800 Pure hypercholesterolemia, unspecified: Secondary | ICD-10-CM

## 2018-03-23 MED ORDER — ATORVASTATIN CALCIUM 40 MG PO TABS: 40.0000 mg | ORAL_TABLET | Freq: Every day | ORAL | 3 refills | Status: DC

## 2018-03-30 NOTE — Progress Notes (Signed)
Cardiology Office Note    Date:  03/31/2018   ID:  [name], DOB 04/03/1960, MRN [id number]  PCP:  Guadalupe Dawn, MD  Cardiologist: Carlyle Dolly, MD    Chief Complaint  Patient presents with   Follow-up    6 week visit    History of Present Illness:    [name] is a 58 y.o. female with past medical history of HTN, HLD, diastolic dysfunction, aortic regurgitation, prior syncope, and frequent palpitations who presents to the office today for 6-week follow-up.  She was evaluated by Dr. Harl Bowie in 01/2018 as a new patient referral for syncope. Reported an episode 3 weeks prior to her visit during which she became unresponsive while watching television and denied any associated symptoms. Did report a history of intermittent palpitations but no symptoms around the time of her syncopal episode. Was found to be orthostatic in clinic with SBP dropping by more than 20 points but her episode seemed atypical for orthostatic hypotension. A 30-day monitor was recommended with consideration of referral to Neurology if no significant arrhythmias. This showed NSR with no significant arrhythmias.   In talking with the patient and her sister today, she reports 3-4 syncopal events since her last office visit. These typically occur when she is sitting down and she loses consciousness per her sister's report. This only lasts for a few minutes and there has not been any witnessed seizure like activity. She denies any associated chest pain, palpitations, or dyspnea when this occurs. No recent orthopnea, PND, or lower extremity edema.  She reports that her husband is an alcoholic and has sporadic sleeping schedules which leads to her not sleeping for a consistent amount of time at night. She denies any physical abuse and says she does feel safe at home. They use to have frequent verbal altercations but she has been trying to reduce the frequency of this. Her sister feels that her lack of sleep and the  stress in her home environment is contributing to her symptoms.   Past Medical History:  Diagnosis Date   Anxiety    Asthma    every now and then.  Wheezing and coughing   Cancer ([place]) 1997   left lung   Chronic abdominal pain    Chronic back pain    Chronic joint pain    Depression    HLD (hyperlipidemia)    Palpitations 01/2013   chronic, intermittent   Shortness of breath dyspnea    with exertion    Past Surgical History:  Procedure Laterality Date   ABDOMINAL HYSTERECTOMY     ABDOMINAL SURGERY     ovarian cyst removal   BIOPSY N/A 11/22/2013   Procedure: GASTRIC BIOPSY;  Surgeon: Daneil Dolin, MD;  Location: [facility];  Service: Endoscopy;  Laterality: N/A;   BLADDER SURGERY  suspension x4   x 4   CARPAL TUNNEL RELEASE Right 07/11/2015   Procedure: RIGHT CARPAL TUNNEL RELEASE;  Surgeon: Jessy Oto, MD;  Location: [facility];  Service: Orthopedics;  Laterality: Right;   CHOLECYSTECTOMY N/A 04/05/2014   Procedure: LAPAROSCOPIC CHOLECYSTECTOMY;  Surgeon: Aviva Signs Md, MD;  Location: [facility];  Service: General;  Laterality: N/A;   COCCYGECTOMY N/A 04/11/2015   Procedure: COCCYGECTOMY WITH OSTEOTOMY THROUGH AREA OF DEFORMITY;  Surgeon: Jessy Oto, MD;  Location: [facility];  Service: Orthopedics;  Laterality: N/A;   COLONOSCOPY     COLONOSCOPY WITH PROPOFOL N/A 11/22/2013   Dr. Gala Romney: Grade 3 and  4/internal hemorrhoidslikely source of hematochezia Normal colonoscopy otherwise.    COLONOSCOPY WITH PROPOFOL N/A 09/15/2017   Dr. Emerson Monte: Hemorrhoids, next colonoscopy 10 years   ESOPHAGOGASTRODUODENOSCOPY (EGD) WITH PROPOFOL N/A 11/22/2013   Dr. Gala Romney: Incomplete Schatzki's ring dilated and disrupted as described above.  Small hiatal hernia. Focally abnormal gastric mucosa of uncertain significance status post biopsy, reactive gastropathy, negative H.pylori   ESOPHAGOGASTRODUODENOSCOPY (EGD) WITH PROPOFOL N/A 08/04/2015   Dr. Gala Romney: mild  Schatzki's ring s/p dilation, small hiatal hernia    ESOPHAGOGASTRODUODENOSCOPY (EGD) WITH PROPOFOL N/A 03/17/2017   Mild Schatki's ring s/p dilation, small hiatal hernia, otherwise normal   EXCISION VAGINAL CYST Left 06/20/2012   Procedure: EXCISION LEFT LABIAL CYST;  Surgeon: Jonnie Kind, MD;  Location: [facility];  Service: Gynecology;  Laterality: Left;   LOBECTOMY Left    LUNG CANCER SURGERY  1997   age 55, resection only   MALONEY DILATION N/A 11/22/2013   Procedure: [name] DILATION;  Surgeon: Daneil Dolin, MD;  Location: [facility];  Service: Endoscopy;  Laterality: N/A;  [place] N/A 08/04/2015   Procedure: [name] DILATION;  Surgeon: Daneil Dolin, MD;  [facility];  Service: Endoscopy;  Laterality: N/A;   MALONEY DILATION N/A 03/17/2017   Procedure: [name] DILATION;  Surgeon: Daneil Dolin, MD;  [facility];  Service: Endoscopy;  Laterality: N/A;   MASS EXCISION N/A 07/23/2014   Procedure: EXCISIONAL BIOPSY NODULE LEFT PARACOCCYGEAL AREA;  Surgeon: Jessy Oto, MD;  Location: [facility];  Service: Orthopedics;  Laterality: N/A;   MASS EXCISION Left 10/10/2015   Procedure: EXCISIONAL BIOPSY OF LEFT DORSORADIAL FOREARM MASS LIPOMA;  Surgeon: Jessy Oto, MD;  Location: [facility];  Service: Orthopedics;  Laterality: Left;   TUBAL LIGATION      Current Medications: Outpatient Medications Prior to Visit  Medication Sig Dispense Refill   acyclovir (ZOVIRAX) 400 MG tablet TAKE 1 TABLET BY MOUTH TWICE DAILY (Patient taking differently: Take 400 mg by mouth 2 (two) times daily. ) 180 tablet 3   albuterol (PROAIR HFA) 108 (90 Base) MCG/ACT inhaler Inhale 2 puffs into the lungs every 6 (six) hours as needed for wheezing or shortness of breath. 1 Inhaler 2   amitriptyline (ELAVIL) 150 MG tablet TAKE 1 TABLET BY MOUTH ONCE DAILY AT NIGHT 30 tablet 5   amLODipine (NORVASC) 10 MG tablet Take 1 tablet (10 mg total) by mouth daily. 90  tablet 3   atorvastatin (LIPITOR) 40 MG tablet Take 1 tablet (40 mg total) by mouth daily. 90 tablet 3   baclofen (LIORESAL) 10 MG tablet Take 1 tablet (10 mg total) by mouth 3 (three) times daily. 30 each 0   budesonide-formoterol (SYMBICORT) 160-4.5 MCG/ACT inhaler Inhale 2 puffs into the lungs 2 (two) times daily. 1 Inhaler 3   celecoxib (CELEBREX) 200 MG capsule Take 1 capsule (200 mg total) by mouth 2 (two) times daily. 60 capsule 2   dexlansoprazole (DEXILANT) 60 MG capsule Take 1 capsule (60 mg total) by mouth daily before breakfast. 90 capsule 3   DULoxetine (CYMBALTA) 60 MG capsule Take 1 capsule (60 mg total) by mouth 2 (two) times daily. 120 capsule 11   linaclotide (LINZESS) 290 MCG CAPS capsule Take 1 capsule (290 mcg total) by mouth daily before breakfast. 30 capsule 11   potassium chloride (KLOR-CON) 20 MEQ packet Take 20 mEq by mouth 2 (two) times daily. 6 tablet 0   pregabalin (LYRICA) 100 MG  capsule TAKE 1 CAPSULE BY MOUTH THREE TIMES DAILY 60 capsule 0   potassium chloride (KLOR-CON) 20 MEQ packet Take 20 mEq by mouth 2 (two) times daily for 3 days. 6 tablet 0   No facility-administered medications prior to visit.      Allergies:   Promethazine hcl   Social History   Socioeconomic History   Marital status: Married    Spouse name: Not on file   Number of children: 3   Years of education: Not on file   Highest education level: Not on file  Occupational History   Not on file  Social Needs   Financial resource strain: Not hard at all   Food insecurity:    Worry: Never true    Inability: Never true   Transportation needs:    Medical: No    Non-medical: No  Tobacco Use   Smoking status: Former Smoker    Packs/day: 1.50    Years: 25.00    Pack years: 37.50    Types: Cigarettes    Last attempt to quit: 04/07/1995    Years since quitting: 22.9   Smokeless tobacco: Never Used  Substance and Sexual Activity   Alcohol use: No   Drug use: No     Sexual activity: Not Currently    Birth control/protection: Surgical  Lifestyle   Physical activity:    Days per week: 0 days    Minutes per session: 0 min   Stress: Not at all  Relationships   Social connections:    Talks on phone: More than three times a week    Gets together: Three times a week    Attends religious service: 1 to 4 times per year    Active member of club or organization: No    Attends meetings of clubs or organizations: Never    Relationship status: Married  Other Topics Concern   Not on file  Social History Narrative   Not on file     Family History:  The patient's family history includes Cancer in her father; Diabetes in an other family member; Heart disease in her father and another family member; Hypertension in her father, mother, sister, and sister; Kidney disease in her father and mother.   Review of Systems:   Please see the history of present illness.     General:  No chills, fever, night sweats or weight changes. Positive for syncope and fatigue.  Cardiovascular:  No chest pain, dyspnea on exertion, edema, orthopnea, palpitations, paroxysmal nocturnal dyspnea. Dermatological: No rash, lesions/masses Respiratory: No cough, dyspnea Urologic: No hematuria, dysuria Abdominal:   No nausea, vomiting, diarrhea, bright red blood per rectum, melena, or hematemesis Neurologic:  No visual changes, wkns, changes in mental status.   All other systems reviewed and are otherwise negative except as noted above.   Physical Exam:    VS:  BP 140/60 (BP Location: Right Arm)    Pulse 88    Ht 5\' 6"  (1.676 m)    Wt 180 lb (81.6 kg)    SpO2 93%    BMI 29.05 kg/m    General: Well developed, well nourished [place] American female appearing in no acute distress. Head: Normocephalic, atraumatic, sclera non-icteric, no xanthomas, nares are without discharge.  Neck: No carotid bruits. JVD not elevated.  Lungs: Respirations regular and unlabored, without wheezes  or rales.  Heart: Regular rate and rhythm. No S3 or S4.  No murmur, no rubs, or gallops appreciated. Abdomen: Soft, non-tender, non-distended with normoactive bowel  sounds. No hepatomegaly. No rebound/guarding. No obvious abdominal masses. Msk:  Strength and tone appear normal for age. No joint deformities or effusions. Extremities: No clubbing or cyanosis. No lower extremity edema.  Distal pedal pulses are 2+ bilaterally. Neuro: Alert and oriented X 3. Moves all extremities spontaneously. No focal deficits noted. Psych:  Responds to questions appropriately with a normal affect. Skin: No rashes or lesions noted  Wt Readings from Last 3 Encounters:  03/31/18 180 lb (81.6 kg)  03/02/18 188 lb 9.6 oz (85.5 kg)  02/17/18 183 lb (83 kg)     Studies/Labs Reviewed:   EKG:  EKG is not ordered today.   Recent Labs: 12/12/2017: Magnesium 2.6 12/13/2017: B Natriuretic Peptide 152.0 02/22/2018: ALT 17; BUN 8; Creatinine, Ser 0.72; Hemoglobin 12.4; Platelets 263; Potassium 3.3; Sodium 139   Lipid Panel    Component Value Date/Time   CHOL 226 (H) 02/15/2013 0926   TRIG 189 (H) 02/15/2013 0926   TRIG 86 06/14/2003   HDL 34 (L) 02/15/2013 0926   CHOLHDL 6.6 02/15/2013 0926   VLDL 38 02/15/2013 0926   LDLCALC 154 (H) 02/15/2013 0926    Additional studies/ records that were reviewed today include:   Echocardiogram: 10/2017 Study Conclusions  - Left ventricle: The cavity size was normal. Wall thickness was   normal. Systolic function was normal. The estimated ejection   fraction was in the range of 60% to 65%. Wall motion was normal;   there were no regional wall motion abnormalities. Features are   consistent with a pseudonormal left ventricular filling pattern,   with concomitant abnormal relaxation and increased filling   pressure (grade 2 diastolic dysfunction). - Aortic valve: There was moderate regurgitation. Valve area (VTI):   3 cm^2. Valve area (Vmax): 3.01 cm^2. Valve area  (Vmean): 2.85   cm^2.  Cardiac Event Monitor: 01/2018  30 day event monitor. Data only available from 64% of planned monitored time  Min HR 64, Max HR 135, Avg HR 90  No symptoms reported  Tracings show sinus rhythm, no significant arrhythmias  Assessment:    1. Syncope, unspecified syncope type   2. Essential hypertension   3. Nonrheumatic aortic valve insufficiency      Plan:   In order of problems listed above:  1. Syncope - Her episodes seem atypical for a cardiac etiology as they typically occur when she is sitting down and she loses consciousness for a few minutes. Denies any associated palpitations, chest pain, dyspnea, lightheadedness, dizziness, or vision changes prior to her events. Seizure-like activity has not been witnessed by family members. - Reviewed her recent echocardiogram and event monitor in detail with the patient and her sister today.  Recent monitor showed no significant arrhythmias as outlined above and she reports having syncopal episodes at times while this was in place. I did recommend referral to Neurology as previously recommended but she declines at this time. She will call back to let us know if she wishes to proceed with this. I also suspect that stress and poor sleep habits are playing a role in her symptoms as discussed above.   2. HTN - BP is at 140/60 during today's visit. HCTZ was recently discontinued by her PCP and transitioned to Amlodipine 10 mg daily in the setting of orthostasis. Encouraged her to continue to follow BP in the ambulatory setting with her recent medication changes.   3. Aortic Regurgitation - Moderate regurgitation by echocardiogram in 10/2017. Will continue to follow with routine studies.  Medication Adjustments/Labs and Tests Ordered: Current medicines are reviewed at length with the patient today.  Concerns regarding medicines are outlined above.  Medication changes, Labs and Tests ordered today are listed in the  Patient Instructions below. Patient Instructions  Medication Instructions:  Your physician recommends that you continue on your current medications as directed. Please refer to the Current Medication list given to you today.  If you need a refill on your cardiac medications before your next appointment, please call your pharmacy.   Lab work: None today If you have labs (blood work) drawn today and your tests are completely normal, you will receive your results only by:  [place] (if you have MyChart) OR  A paper copy in the mail If you have any lab test that is abnormal or we need to change your treatment, we will call you to review the results.  Testing/Procedures: None today  Follow-Up: At [facility], you and your health needs are our priority.  As part of our continuing mission to provide you with exceptional heart care, we have created designated Provider Care Teams.  These Care Teams include your primary Cardiologist (physician) and Advanced Practice Providers (APPs -  Physician Assistants and Nurse Practitioners) who all work together to provide you with the care you need, when you need it. You will need a follow up appointment in 12 months.  Please call our office 2 months in advance to schedule this appointment.  You may see Carlyle Dolly, MD or one of the following Advanced Practice Providers on your designated Care Team:   Bernerd Pho, PA-C ([place])  Ermalinda Barrios, PA-C [facility])  Any Other Special Instructions Will Be Listed Below (If Applicable). None   Signed, Erma Heritage, PA-C  03/31/2018 4:10 PM     [facility] [address] [place], [place] [address] Phone: [phone] Fax: [phone]

## 2018-03-31 ENCOUNTER — Ambulatory Visit (INDEPENDENT_AMBULATORY_CARE_PROVIDER_SITE_OTHER): Payer: Medicare Other | Admitting: Student

## 2018-03-31 ENCOUNTER — Encounter: Payer: Self-pay | Admitting: Student

## 2018-03-31 ENCOUNTER — Other Ambulatory Visit: Payer: Self-pay

## 2018-03-31 VITALS — BP 140/60 | HR 88 | Ht 66.0 in | Wt 180.0 lb

## 2018-03-31 DIAGNOSIS — R55 Syncope and collapse: Secondary | ICD-10-CM

## 2018-03-31 DIAGNOSIS — I351 Nonrheumatic aortic (valve) insufficiency: Secondary | ICD-10-CM | POA: Diagnosis not present

## 2018-03-31 DIAGNOSIS — I10 Essential (primary) hypertension: Secondary | ICD-10-CM | POA: Diagnosis not present

## 2018-03-31 NOTE — Patient Instructions (Signed)
Medication Instructions:  Your physician recommends that you continue on your current medications as directed. Please refer to the Current Medication list given to you today.  If you need a refill on your cardiac medications before your next appointment, please call your pharmacy.   Lab work: None today If you have labs (blood work) drawn today and your tests are completely normal, you will receive your results only by: [facility] MyChart Message (if you have MyChart) OR . A paper copy in the mail If you have any lab test that is abnormal or we need to change your treatment, we will call you to review the results.  Testing/Procedures: None today  Follow-Up: At [facility], you and your health needs are our priority.  As part of our continuing mission to provide you with exceptional heart care, we have created designated Provider Care Teams.  These Care Teams include your primary Cardiologist (physician) and Advanced Practice Providers (APPs -  Physician Assistants and Nurse Practitioners) who all work together to provide you with the care you need, when you need it. You will need a follow up appointment in 12 months.  Please call our office 2 months in advance to schedule this appointment.  You may see Carlyle Dolly, MD or one of the following Advanced Practice Providers on your designated Care Team:   Bernerd Pho, PA-C [facility]) . Ermalinda Barrios, PA-C ([place])  Any Other Special Instructions Will Be Listed Below (If Applicable). None

## 2018-04-01 ENCOUNTER — Other Ambulatory Visit: Payer: Self-pay

## 2018-04-01 ENCOUNTER — Emergency Department (HOSPITAL_COMMUNITY)
Admission: EM | Admit: 2018-04-01 | Discharge: 2018-04-01 | Disposition: A | Discharge status: Home / Self Care | Payer: Medicare Other | Attending: Emergency Medicine | Admitting: Emergency Medicine

## 2018-04-01 ENCOUNTER — Encounter (HOSPITAL_COMMUNITY): Payer: Self-pay | Admitting: Emergency Medicine

## 2018-04-01 ENCOUNTER — Emergency Department (HOSPITAL_COMMUNITY): Payer: Medicare Other

## 2018-04-01 DIAGNOSIS — R05 Cough: Secondary | ICD-10-CM | POA: Diagnosis not present

## 2018-04-01 DIAGNOSIS — I10 Essential (primary) hypertension: Secondary | ICD-10-CM | POA: Insufficient documentation

## 2018-04-01 DIAGNOSIS — J45909 Unspecified asthma, uncomplicated: Secondary | ICD-10-CM | POA: Diagnosis not present

## 2018-04-01 DIAGNOSIS — B9789 Other viral agents as the cause of diseases classified elsewhere: Secondary | ICD-10-CM | POA: Diagnosis not present

## 2018-04-01 DIAGNOSIS — J069 Acute upper respiratory infection, unspecified: Secondary | ICD-10-CM | POA: Insufficient documentation

## 2018-04-01 DIAGNOSIS — Z87891 Personal history of nicotine dependence: Secondary | ICD-10-CM | POA: Diagnosis not present

## 2018-04-01 DIAGNOSIS — R059 Cough, unspecified: Secondary | ICD-10-CM

## 2018-04-01 DIAGNOSIS — R0602 Shortness of breath: Secondary | ICD-10-CM | POA: Diagnosis not present

## 2018-04-01 DIAGNOSIS — R509 Fever, unspecified: Secondary | ICD-10-CM | POA: Diagnosis not present

## 2018-04-01 DIAGNOSIS — J449 Chronic obstructive pulmonary disease, unspecified: Secondary | ICD-10-CM | POA: Insufficient documentation

## 2018-04-01 DIAGNOSIS — Z79899 Other long term (current) drug therapy: Secondary | ICD-10-CM | POA: Diagnosis not present

## 2018-04-01 LAB — CBC WITH DIFFERENTIAL/PLATELET
Abs Immature Granulocytes: 0.01 10*3/uL (ref 0.00–0.07)
Basophils Absolute: 0 10*3/uL (ref 0.0–0.1)
Basophils Relative: 1 %
Eosinophils Absolute: 0.2 10*3/uL (ref 0.0–0.5)
Eosinophils Relative: 4 %
HEMATOCRIT: 38.5 % (ref 36.0–46.0)
Hemoglobin: 12.4 g/dL (ref 12.0–15.0)
Immature Granulocytes: 0 %
Lymphocytes Relative: 48 %
Lymphs Abs: 2.1 10*3/uL (ref 0.7–4.0)
MCH: 29.7 pg (ref 26.0–34.0)
MCHC: 32.2 g/dL (ref 30.0–36.0)
MCV: 92.1 fL (ref 80.0–100.0)
Monocytes Absolute: 0.3 10*3/uL (ref 0.1–1.0)
Monocytes Relative: 7 %
Neutro Abs: 1.7 10*3/uL (ref 1.7–7.7)
Neutrophils Relative %: 40 %
Platelets: 235 10*3/uL (ref 150–400)
RBC: 4.18 MIL/uL (ref 3.87–5.11)
RDW: 13 % (ref 11.5–15.5)
WBC: 4.3 10*3/uL (ref 4.0–10.5)
nRBC: 0 % (ref 0.0–0.2)

## 2018-04-01 LAB — INFLUENZA PANEL BY PCR (TYPE A & B)
Influenza A By PCR: NEGATIVE
Influenza B By PCR: NEGATIVE

## 2018-04-01 LAB — COMPREHENSIVE METABOLIC PANEL
ALT: 11 U/L (ref 0–44)
AST: 14 U/L — ABNORMAL LOW (ref 15–41)
Albumin: 4 g/dL (ref 3.5–5.0)
Alkaline Phosphatase: 144 U/L — ABNORMAL HIGH (ref 38–126)
Anion gap: 8 (ref 5–15)
BUN: 6 mg/dL (ref 6–20)
CO2: 27 mmol/L (ref 22–32)
Calcium: 8.8 mg/dL — ABNORMAL LOW (ref 8.9–10.3)
Chloride: 104 mmol/L (ref 98–111)
Creatinine, Ser: 0.78 mg/dL (ref 0.44–1.00)
GFR calc Af Amer: >60 mL/min (ref >60)
GFR calc non Af Amer: >60 mL/min (ref >60)
GLUCOSE: 88 mg/dL (ref 70–99)
Potassium: 3.7 mmol/L (ref 3.5–5.1)
Sodium: 139 mmol/L (ref 135–145)
Total Bilirubin: 0.6 mg/dL (ref 0.3–1.2)
Total Protein: 7.4 g/dL (ref 6.5–8.1)

## 2018-04-01 MED ORDER — CHLORPHENIRAMINE-DM 4-30 MG PO TABS: ORAL_TABLET | ORAL | 0 refills | Status: DC

## 2018-04-01 MED ORDER — ALBUTEROL SULFATE HFA 108 (90 BASE) MCG/ACT IN AERS: 1.0000 | INHALATION_SPRAY | RESPIRATORY_TRACT | 0 refills | Status: DC | PRN

## 2018-04-01 NOTE — Discharge Instructions (Addendum)
Your blood pressure is slightly elevated, but otherwise your vital signs are within normal limits.  Your oxygen level is 99% on room air.  Your influenza test, your chemistries, your complete blood count, and your electrocardiogram are all negative for acute problem.  Your chest x-ray is negative for pneumonia or any other acute lung related issue.  The previous right lung pneumonia has cleared.  The area of your lung surgery also appears to be healed.  Please wash your hands frequently, as the symptoms may be related to a viral illness.  Please increase your fluids. Use albuterol every 4 hours as needed for difficulty with breathing. Please see your primary physician for follow-up of your symptoms, as well as to discuss your difficulties with sleeping and rest.  Please return to the emergency department if any emergent changes in your condition, problems, or concerns.

## 2018-04-01 NOTE — ED Provider Notes (Signed)
[facility] Provider Note   CSN: [id number] Arrival date & time: 04/01/18  1043    History   Chief Complaint Chief Complaint  Patient presents with  . Cough    HPI [name] is a 58 y.o. female.     Patient is a 58 year old female who presents to the emergency department with a complaint of cough and not feeling well.  The patient states that she has been having cough with a yellowish phlegm over the past 2 weeks.  Is been getting progressively worse.  She is been having some body aches, some poor eating at times.  She says she has had some chills as well.  She has been bothered with headache from time to time.  She presents to the emergency department for evaluation.  She says that she should have seen her doctor earlier, but was trying to wait to see if it would go away.  She says she does not like to go to the hospital.  Patient has a history of left lung cancer, shortness of breath, palpitations, depression, and anxiety.  There is been no hemoptysis reported.  No high fever reported.  No syncope.  The history is provided by the patient and a relative.    Past Medical History:  Diagnosis Date  . Anxiety   . Asthma    every now and then.  Wheezing and coughing  . Cancer ([place]) 1997   left lung  . Chronic abdominal pain   . Chronic back pain   . Chronic joint pain   . Depression   . HLD (hyperlipidemia)   . Palpitations 01/2013   chronic, intermittent  . Shortness of breath dyspnea    with exertion    Patient Active Problem List   Diagnosis Date Noted  . Hemorrhoid 02/15/2018  . Abdominal distention 02/15/2018  . Anemia 01/09/2018  . Pain of upper abdomen 01/09/2018  . Healthcare maintenance 12/30/2017  . Antibiotic-associated diarrhea 12/22/2017  . Vagina itching 12/22/2017  . Sepsis ([place])   . Respiratory distress   . Partial small bowel obstruction ([place]) 12/11/2017  . COPD with acute exacerbation ([place]) 12/11/2017  . Hypophosphatemia  12/11/2017  . Sepsis due to undetermined organism ([place]) 12/10/2017  . AKI (acute kidney injury) ([place]) 12/10/2017  . Hyperbilirubinemia 12/10/2017  . Aortic regurgitation 10/25/2017  . Diastolic dysfunction 60/73/7106  . Dysphagia 01/20/2017  . Left hip pain 01/19/2017  . Right hand pain 01/19/2017  . Diarrhea 10/11/2016  . Right hip pain 06/07/2016  . Dysuria 04/14/2016  . Malaise and fatigue 04/14/2016  . Breast pain, left 11/06/2015  . Mass of left forearm 10/10/2015    Class: Chronic  . Dysphagia, oropharyngeal   . Gastroesophageal reflux disease without esophagitis   . Carpal tunnel syndrome, right 07/11/2015    Class: Chronic  . Prediabetes 07/09/2015  . Lumbar stenosis with neurogenic claudication 01/05/2015  . Spondylolisthesis of lumbar region 01/03/2015    Class: Chronic  . Spinal stenosis, lumbar region, with neurogenic claudication 01/03/2015    Class: Chronic  . Soft tissue mass 07/23/2014    Class: Acute  . Coccygodynia 07/23/2014    Class: Acute  . Constipation 02/15/2014  . Dysphagia, pharyngoesophageal phase   . Elevated alkaline phosphatase level 11/06/2013  . Rectal bleeding 11/06/2013  . Obesity, unspecified 02/15/2013  . Genital herpes 10/12/2012  . HSV (herpes simplex virus) anogenital infection 09/29/2012  . UNSPECIFIED SLEEP APNEA 12/04/2008  . ADENOCARCINOMA, LUNG 10/09/2008  . Osteoarthritis of  multiple joints 07/13/2006  . HYPERCHOLESTEROLEMIA 03/17/2006  . DEPRESSION, MAJOR, RECURRENT 03/17/2006  . HYPERTENSION, BENIGN SYSTEMIC 03/17/2006  . GASTROESOPHAGEAL REFLUX, NO ESOPHAGITIS 03/17/2006  . INSOMNIA NOS 03/17/2006    Past Surgical History:  Procedure Laterality Date  . ABDOMINAL HYSTERECTOMY    . ABDOMINAL SURGERY     ovarian cyst removal  . BIOPSY N/A 11/22/2013   Procedure: GASTRIC BIOPSY;  Surgeon: Daneil Dolin, MD;  Location: [facility];  Service: Endoscopy;  Laterality: N/A;  . BLADDER SURGERY  suspension x4   x 4  . CARPAL  TUNNEL RELEASE Right 07/11/2015   Procedure: RIGHT CARPAL TUNNEL RELEASE;  Surgeon: Jessy Oto, MD;  Location: [facility];  Service: Orthopedics;  Laterality: Right;  . CHOLECYSTECTOMY N/A 04/05/2014   Procedure: LAPAROSCOPIC CHOLECYSTECTOMY;  Surgeon: Aviva Signs Md, MD;  Location: [facility];  Service: General;  Laterality: N/A;  . COCCYGECTOMY N/A 04/11/2015   Procedure: COCCYGECTOMY WITH OSTEOTOMY THROUGH AREA OF DEFORMITY;  Surgeon: Jessy Oto, MD;  Location: [facility];  Service: Orthopedics;  Laterality: N/A;  . COLONOSCOPY    . COLONOSCOPY WITH PROPOFOL N/A 11/22/2013   Dr. Gala Romney: Grade 3 and 4/internal hemorrhoids?"likely source of hematochezia Normal colonoscopy otherwise.   Marland Kitchen COLONOSCOPY WITH PROPOFOL N/A 09/15/2017   Dr. Emerson Monte: Hemorrhoids, next colonoscopy 10 years  . ESOPHAGOGASTRODUODENOSCOPY (EGD) WITH PROPOFOL N/A 11/22/2013   Dr. Gala Romney: Incomplete Schatzki's ring dilated and disrupted as described above.  Small hiatal hernia. Focally abnormal gastric mucosa of uncertain significance status post biopsy, reactive gastropathy, negative H.pylori  . ESOPHAGOGASTRODUODENOSCOPY (EGD) WITH PROPOFOL N/A 08/04/2015   Dr. Gala Romney: mild Schatzki's ring s/p dilation, small hiatal hernia   . ESOPHAGOGASTRODUODENOSCOPY (EGD) WITH PROPOFOL N/A 03/17/2017   Mild Schatki's ring s/p dilation, small hiatal hernia, otherwise normal  . EXCISION VAGINAL CYST Left 06/20/2012   Procedure: EXCISION LEFT LABIAL CYST;  Surgeon: Jonnie Kind, MD;  Location: [facility];  Service: Gynecology;  Laterality: Left;  . LOBECTOMY Left   . LUNG CANCER SURGERY  1997   age 61, resection only  . MALONEY DILATION N/A 11/22/2013   Procedure: [name] DILATION;  Surgeon: Daneil Dolin, MD;  Location: [facility];  Service: Endoscopy;  Laterality: N/A;  56  . MALONEY DILATION N/A 08/04/2015   Procedure: [name] DILATION;  Surgeon: Daneil Dolin, MD;  [facility];  Service: Endoscopy;  Laterality: N/A;  [name] DILATION N/A 03/17/2017   Procedure: [name] DILATION;  Surgeon: Daneil Dolin, MD;  [facility];  Service: Endoscopy;  Laterality: N/A;  . MASS EXCISION N/A 07/23/2014   Procedure: EXCISIONAL BIOPSY NODULE LEFT PARACOCCYGEAL AREA;  Surgeon: Jessy Oto, MD;  Location: [facility];  Service: Orthopedics;  Laterality: N/A;  . MASS EXCISION Left 10/10/2015   Procedure: EXCISIONAL BIOPSY OF LEFT DORSORADIAL FOREARM MASS LIPOMA;  Surgeon: Jessy Oto, MD;  Location: [facility];  Service: Orthopedics;  Laterality: Left;  . TUBAL LIGATION       OB History    Gravida  3   Para  3   Term  3   Preterm      AB      Living  3     SAB      TAB      Ectopic      Multiple      Live Births               Home Medications  Prior to Admission medications   Medication Sig Start Date End Date Taking? Authorizing Provider  acyclovir (ZOVIRAX) 400 MG tablet TAKE 1 TABLET BY MOUTH TWICE DAILY Patient taking differently: Take 400 mg by mouth 2 (two) times daily.  05/30/17  Yes Verner Mould, MD  albuterol [facility] HFA) 108 [phone] Base) MCG/ACT inhaler Inhale 2 puffs into the lungs every 6 (six) hours as needed for wheezing or shortness of breath. 11/02/16  Yes Verner Mould, MD  amitriptyline (ELAVIL) 150 MG tablet TAKE 1 TABLET BY MOUTH ONCE DAILY AT NIGHT 11/14/17  Yes Guadalupe Dawn, MD  amLODipine (NORVASC) 10 MG tablet Take 1 tablet (10 mg total) by mouth daily. 10/13/17  Yes Martyn Malay, MD  atorvastatin (LIPITOR) 40 MG tablet Take 1 tablet (40 mg total) by mouth daily. 03/23/18  Yes Guadalupe Dawn, MD  baclofen (LIORESAL) 10 MG tablet Take 1 tablet (10 mg total) by mouth 3 (three) times daily. 02/16/18  Yes Jessy Oto, MD  budesonide-formoterol [facility]) 160-4.5 MCG/ACT inhaler Inhale 2 puffs into the lungs 2 (two) times daily. 12/15/17  Yes Barton Dubois, MD  celecoxib (CELEBREX) 200 MG capsule Take 1 capsule (200 mg  total) by mouth 2 (two) times daily. 02/16/18  Yes Jessy Oto, MD  dexlansoprazole (DEXILANT) 60 MG capsule Take 1 capsule (60 mg total) by mouth daily before breakfast. 09/14/17  Yes Mahala Menghini, PA-C  DULoxetine (CYMBALTA) 60 MG capsule Take 1 capsule (60 mg total) by mouth 2 (two) times daily. 04/19/17  Yes Verner Mould, MD  linaclotide [facility]) 290 MCG CAPS capsule Take 1 capsule (290 mcg total) by mouth daily before breakfast. 02/15/18  Yes Mahala Menghini, PA-C  pregabalin (LYRICA) 100 MG capsule TAKE 1 CAPSULE BY MOUTH THREE TIMES DAILY 03/23/18  Yes Guadalupe Dawn, MD  potassium chloride (KLOR-CON) 20 MEQ packet Take 20 mEq by mouth 2 (two) times daily. Patient not taking: Reported on 04/01/2018 02/27/18   Rourk, [name], MD  potassium chloride (KLOR-CON) 20 MEQ packet Take 20 mEq by mouth 2 (two) times daily for 3 days. 03/03/18 03/06/18  Daneil Dolin, MD    Family History Family History  Problem Relation Age of Onset  . Diabetes Other   . Heart disease Other        has Psychologist, forensic  . Hypertension Mother   . Kidney disease Mother   . Hypertension Father   . Kidney disease Father   . Heart disease Father   . Cancer Father        leukemia  . Hypertension Sister   . Hypertension Sister   . Colon cancer Neg Hx     Social History Social History   Tobacco Use  . Smoking status: Former Smoker    Packs/day: 1.50    Years: 25.00    Pack years: 37.50    Types: Cigarettes    Last attempt to quit: 04/07/1995    Years since quitting: 23.0  . Smokeless tobacco: Never Used  Substance Use Topics  . Alcohol use: No  . Drug use: No     Allergies   Promethazine hcl   Review of Systems Review of Systems  Constitutional: Positive for activity change, appetite change, chills and fever.       All ROS Neg except as noted in HPI  HENT: Positive for congestion. Negative for nosebleeds.   Eyes: Negative for photophobia and discharge.  Respiratory: Positive for  cough and shortness of breath. Negative  for wheezing.   Cardiovascular: Negative for chest pain and palpitations.  Gastrointestinal: Negative for abdominal pain and blood in stool.  Genitourinary: Negative for dysuria, frequency and hematuria.  Musculoskeletal: Positive for myalgias. Negative for arthralgias, back pain and neck pain.  Skin: Negative.   Neurological: Negative for dizziness, seizures and speech difficulty.  Psychiatric/Behavioral: Negative for confusion and hallucinations.     Physical Exam Updated Vital Signs BP (!) 136/52   Pulse 77   Temp 98.2 F (36.8 C) (Oral)   Resp 13   Ht 5\' 6"  (1.676 m)   Wt 82 kg   SpO2 98%   BMI 29.18 kg/m   Physical Exam Vitals signs and nursing note reviewed.  Constitutional:      Appearance: She is well-developed. She is not toxic-appearing.  HENT:     Head: Normocephalic.     Right Ear: Tympanic membrane and external ear normal.     Left Ear: Tympanic membrane and external ear normal.     Nose: Congestion present.  Eyes:     General: Lids are normal.     Pupils: Pupils are equal, round, and reactive to light.  Neck:     Musculoskeletal: Normal range of motion and neck supple.     Vascular: No carotid bruit.  Cardiovascular:     Rate and Rhythm: Normal rate and regular rhythm.     Pulses: Normal pulses.     Heart sounds: Normal heart sounds. No friction rub. No gallop.      Comments: No pitting edema of the lower extremities.  Capillary refill is less than 2 seconds. Pulmonary:     Effort: No respiratory distress.     Breath sounds: Rhonchi present.  Abdominal:     General: Bowel sounds are normal.     Palpations: Abdomen is soft.     Tenderness: There is no abdominal tenderness. There is no guarding.  Musculoskeletal: Normal range of motion.        General: No swelling.  Lymphadenopathy:     Head:     Right side of head: No submandibular adenopathy.     Left side of head: No submandibular adenopathy.     Cervical:  No cervical adenopathy.  Skin:    General: Skin is warm and dry.  Neurological:     Mental Status: She is alert and oriented to person, place, and time.     Cranial Nerves: No cranial nerve deficit.     Sensory: No sensory deficit.  Psychiatric:        Speech: Speech normal.      ED Treatments / Results  Labs (all labs ordered are listed, but only abnormal results are displayed) Labs Reviewed  INFLUENZA PANEL BY PCR (TYPE A & B)    EKG None  Radiology Dg Chest Port 1 View  Result Date: 04/01/2018 CLINICAL DATA:  History of lung cancer.  Cough. EXAM: PORTABLE CHEST 1 VIEW COMPARISON:  12/13/2017 FINDINGS: Previously seen right lung pneumonia has resolved. The right lung appears clear. Previous lobectomy on the left. No sign of left lung infiltrate on this one view study. No effusion. Bony structures unremarkable. IMPRESSION: Previous lobectomy on the left. No acute abnormal finding. Resolution of previously seen right pneumonia. Consider two-view exam if concern persists. Electronically Signed   By: Nelson Chimes M.D.   On: 04/01/2018 11:44    Procedures Procedures (including critical care time)  Medications Ordered in ED Medications - No data to display   Initial Impression /  Assessment and Plan / ED Course  I have reviewed the triage vital signs and the nursing notes.  Pertinent labs & imaging results that were available during my care of the patient were reviewed by me and considered in my medical decision making (see chart for details).          Final Clinical Impressions(s) / ED Diagnoses MDM  Vital signs reviewed.  Pulse oximetry is 98% on room air.  Within normal limits by my interpretation.  The patient is not in acute distress during the examination.  She is conversing with friends and family in the room.  She speaks in complete sentences without significant problem.  The comprehensive metabolic panel shows normal electrolytes and glucose.  Normal kidney  function.  The AST is slightly low at 15 and the alkaline phosphatase is slightly high at 144, otherwise within normal limits. The anion gap is normal at 8.  The complete blood count is well within normal limits.  No evidence for systemic infection or severe anemia.  Influenza test is negative. Chest x-ray shows a previous left lobectomy with no acute findings at the surgical site or in the left lung.  The patient had previously been diagnosed with a right lung pneumonia back in November 2019.  The right lung pneumonia seems to have resolved.  I discussed these findings in detail with the patient in terms of which he understands.  Questions were answered.  The patient was ambulated in the room without significant problem or desaturation.  I have asked the patient to use chlorpheniramine DM for cough and congestion.  The patient was also given an albuterol inhaler to use to assist with breathing.  I have asked the patient to follow-up with her primary physician.  The patient was given instructions to return to the emergency department if any worsening of her symptoms, fever that will not be controlled by Tylenol every 4 hours, problems, or concerns.  Patient is in agreement with this plan.   Final diagnoses:  Viral URI with cough    ED Discharge Orders         Ordered    Chlorpheniramine-DM 4-30 MG TABS     04/01/18 1349    albuterol (PROVENTIL HFA;VENTOLIN HFA) 108 (90 Base) MCG/ACT inhaler  Every 4 hours PRN     04/01/18 1351           Lily Kocher, PA-C 04/02/18 2230    Maudie Flakes, MD 04/04/18 [phone]

## 2018-04-01 NOTE — ED Notes (Signed)
HB in to assess 

## 2018-04-01 NOTE — ED Notes (Signed)
No flu shot  Flu like sx x several days   Hx lung ca

## 2018-04-01 NOTE — ED Triage Notes (Signed)
Pt states she feels like she has pneumonia.  Has been coughing, aching, feeling hot (did not check temp), and producing yellow sputum for about 2 weeks progressing.

## 2018-04-01 NOTE — ED Notes (Signed)
Pt not able to urinate at this time, aware of DO, gave ginger ale per RN

## 2018-04-03 ENCOUNTER — Other Ambulatory Visit: Payer: Self-pay

## 2018-04-03 DIAGNOSIS — R945 Abnormal results of liver function studies: Secondary | ICD-10-CM

## 2018-04-03 DIAGNOSIS — R7989 Other specified abnormal findings of blood chemistry: Secondary | ICD-10-CM

## 2018-04-20 ENCOUNTER — Other Ambulatory Visit: Payer: Self-pay

## 2018-04-20 DIAGNOSIS — E876 Hypokalemia: Secondary | ICD-10-CM

## 2018-04-24 ENCOUNTER — Other Ambulatory Visit: Payer: Self-pay | Admitting: *Deleted

## 2018-04-24 ENCOUNTER — Other Ambulatory Visit: Payer: Self-pay | Admitting: Family Medicine

## 2018-04-24 MED ORDER — PREGABALIN 100 MG PO CAPS: 100.0000 mg | ORAL_CAPSULE | Freq: Three times a day (TID) | ORAL | 0 refills | Status: DC

## 2018-04-24 MED ORDER — DULOXETINE HCL 60 MG PO CPEP: 60.0000 mg | ORAL_CAPSULE | Freq: Two times a day (BID) | ORAL | 11 refills | Status: DC

## 2018-05-01 NOTE — Telephone Encounter (Signed)
PA request for Amitiza was received through [email or web address]. request was deleted. Pt was last documented taking Linzess as directed per LSL.

## 2018-05-02 NOTE — Telephone Encounter (Signed)
Received another request for Amitiza. Called pt to inquire if she's taking Linzess. Pt said she is taking Amitiza and Linzess. Pt was advised at last ov to take Linzess. Pt said she will take Linzess. If pt isn't able to have a bowel movement with taking Linzess, she will call back.

## 2018-05-17 ENCOUNTER — Other Ambulatory Visit (INDEPENDENT_AMBULATORY_CARE_PROVIDER_SITE_OTHER): Payer: Self-pay | Admitting: Specialist

## 2018-05-17 ENCOUNTER — Other Ambulatory Visit: Payer: Self-pay | Admitting: Family Medicine

## 2018-05-17 DIAGNOSIS — M48062 Spinal stenosis, lumbar region with neurogenic claudication: Secondary | ICD-10-CM

## 2018-05-18 NOTE — Telephone Encounter (Signed)
Celecoxib refill request

## 2018-05-19 ENCOUNTER — Telehealth (INDEPENDENT_AMBULATORY_CARE_PROVIDER_SITE_OTHER): Payer: Medicare Other | Admitting: Family Medicine

## 2018-05-19 ENCOUNTER — Other Ambulatory Visit: Payer: Self-pay

## 2018-05-19 ENCOUNTER — Encounter: Payer: Self-pay | Admitting: Family Medicine

## 2018-05-19 DIAGNOSIS — N3000 Acute cystitis without hematuria: Secondary | ICD-10-CM | POA: Diagnosis not present

## 2018-05-19 DIAGNOSIS — G4710 Hypersomnia, unspecified: Secondary | ICD-10-CM

## 2018-05-19 MED ORDER — CEPHALEXIN 500 MG PO CAPS: 500.0000 mg | ORAL_CAPSULE | Freq: Four times a day (QID) | ORAL | 0 refills | Status: AC

## 2018-05-19 NOTE — Progress Notes (Signed)
[facility] Telemedicine Visit  Patient consented to have virtual visit. Method of visit: Telephone  Encounter participants: Patient: [name] - located at home  Provider: Martyn Malay - located at [facility]  Others (if applicable): none   Chief Complaint: I think I have a urinary infection  HPI:  [name] is a pleasant 58 year old woman with history of aortic regurgitation, recent admission for pneumonia, COPD, left lung cancer and osteoarthritis presenting via telemedicine visit for urinary frequency.  The patient reports she has had 3 days of burning with urination and 2 days of frequency.  She reports urgency and hesitancy with urination as well.  She reports she is drinking a lot of water intent to flush out her system.  She denies hematuria, flank pain, nausea, vomiting, fevers.  She has no medication allergies.  She reports she last had a UTI several years ago and this feels exactly like her prior UTI.  She denies any vaginal symptoms.  The patient reports she has not yet had her sleep study.  She continues to endorse excessive daytime sleepiness and snoring.  ROS: per HPI  Pertinent PMHx:  The patient has a history of a hysterectomy The patient has a history of COPD and was previously on home oxygen which she has been weaned off of No history of known urinary tract anomalies  Exam:  Respiratory: Speaking in full sentences breathing comfortably throughout conversation   Assessment/Plan:  Diagnoses and all orders for this visit:  Acute cystitis without hematuria -     cephALEXin (KEFLEX) 500 MG capsule; Take 1 capsule (500 mg total) by mouth 4 (four) times daily for 5 days.  Excessive sleepiness -     Ambulatory referral to Sleep Studies   Time spent during visit with patient: 8 minutes  This encounter took place during the corona virus pandemic of 2020. Clinical practice guidelines, policies, recommendations, and protocols changed daily  during this time. The patient was cared for in alignment with the current policies of the day.

## 2018-05-22 ENCOUNTER — Telehealth: Payer: Self-pay | Admitting: Neurology

## 2018-05-22 ENCOUNTER — Encounter: Payer: Self-pay | Admitting: Neurology

## 2018-05-22 NOTE — Telephone Encounter (Signed)
Due to current COVID 19 pandemic, our office is severely reducing in office visits, in order to minimize the risk to our patients and healthcare providers.    Pt understands that although there may be some limitations with this type of visit, we will take all precautions to reduce any security or privacy concerns.  Pt understands that this will be treated like an in office visit and we will file with pt's insurance, and there may be a patient responsible charge related to this service.   Pt understands that the nurse will be calling to go over pt's chart.

## 2018-05-22 NOTE — Telephone Encounter (Signed)
Called the patient to review their chart and made sure that everything was up to date. Patient informed they do have the app downloaded as well as received the e-mail for the visit. Instructed to make sure they hold on to the e-mail for the upcoming appointment as it is necessary to access their appointment. Instructed the patient that apx 30 min prior to the appointment the front staff will contact them to make sure they are ready to go for their appointment in case there is any need for troubleshooting it can be completed prior to the appointment time. Pt verbalized understanding. Emailed the patinet EPW and neck measurements to complete prior to the apt. Pt verbalized understanding.

## 2018-05-23 ENCOUNTER — Ambulatory Visit (INDEPENDENT_AMBULATORY_CARE_PROVIDER_SITE_OTHER): Payer: Medicare Other | Admitting: Neurology

## 2018-05-23 ENCOUNTER — Encounter: Payer: Self-pay | Admitting: Neurology

## 2018-05-23 ENCOUNTER — Other Ambulatory Visit: Payer: Self-pay

## 2018-05-23 DIAGNOSIS — Z72821 Inadequate sleep hygiene: Secondary | ICD-10-CM | POA: Diagnosis not present

## 2018-05-23 DIAGNOSIS — G4701 Insomnia due to medical condition: Secondary | ICD-10-CM | POA: Diagnosis not present

## 2018-05-23 DIAGNOSIS — J1000 Influenza due to other identified influenza virus with unspecified type of pneumonia: Secondary | ICD-10-CM

## 2018-05-23 DIAGNOSIS — R55 Syncope and collapse: Secondary | ICD-10-CM | POA: Diagnosis not present

## 2018-05-23 DIAGNOSIS — K625 Hemorrhage of anus and rectum: Secondary | ICD-10-CM

## 2018-05-23 DIAGNOSIS — R7301 Impaired fasting glucose: Secondary | ICD-10-CM

## 2018-05-23 DIAGNOSIS — R0683 Snoring: Secondary | ICD-10-CM

## 2018-05-23 DIAGNOSIS — M461 Sacroiliitis, not elsewhere classified: Secondary | ICD-10-CM | POA: Insufficient documentation

## 2018-05-23 DIAGNOSIS — R5383 Other fatigue: Secondary | ICD-10-CM

## 2018-05-23 DIAGNOSIS — C3490 Malignant neoplasm of unspecified part of unspecified bronchus or lung: Secondary | ICD-10-CM | POA: Diagnosis not present

## 2018-05-23 DIAGNOSIS — D500 Iron deficiency anemia secondary to blood loss (chronic): Secondary | ICD-10-CM

## 2018-05-23 DIAGNOSIS — I951 Orthostatic hypotension: Secondary | ICD-10-CM

## 2018-05-23 DIAGNOSIS — R5381 Other malaise: Secondary | ICD-10-CM

## 2018-05-23 DIAGNOSIS — J441 Chronic obstructive pulmonary disease with (acute) exacerbation: Secondary | ICD-10-CM

## 2018-05-23 DIAGNOSIS — R351 Nocturia: Secondary | ICD-10-CM

## 2018-05-23 NOTE — Progress Notes (Signed)
I tried several times to connect with the patient and was not successful- Patient was in a store, walking the aisle and wearing the required face mask, Internet connection was very  poor and broke off.   Patient was reached by chat and we postponed the Video- Consult to 12.30 , when she is back at her place.  Larey Seat, MD 05/23/2018 11.21 AM   Virtual Visit via Video Note  I connected with [name] on 05/23/18 at 11:00 AM EDT by a video enabled telemedicine application and verified that I am speaking with the correct person using two identifiers.  Location: Patient: in a super market,  In her car, finally at her home - requested for her to place phone on a stand in a table in front  of her, in a well lit location. I need to be able to see her face , upper body and likely do a walking exam in front of the camera.    Provider: at [facility]  I discussed the limitations of evaluation and management by telemedicine and the availability of in person appointments. The patient expressed understanding and agreed to proceed.  History of Present Illness: patient is referred for a sleep study, EDS- but denies being sleepy in daytime and reports poor sleep quality at night!   [place]   Provider:  Larey Seat, M D  Primary Care Physician:  Guadalupe Dawn, MD   Referring Provider: Dorris Singh, MD   HPI:  [name] is a 58 y.o. female patient and referred for a sleep consultation- she agreed to a video evaluation, observation and interview. The patient was directly referred on 19 May 2018 after she had a telemedicine appointment at the [facility] family [facility] with Dr. Lanny Hurst.  She was diagnosed as having likely a UTI and Keflex had been called in 500 mg 4 times daily for 5 days.  During this visit she also mentioned that she was excessively daytime sleepy and an ambulatory referral to sleep studies was placed.  Apparently this had been discussed before since  Dr. Owens Shark stated that the patient reports she has not had year heard about "her sleep study" but we had no order for such at [facility].      Sleep and medical history:   During this visit she also mentioned that she was excessively daytime sleepy and an ambulatory referral to sleep studies was placed..  She continues to snore and has excessive daytime sleepiness.  She has a history of COPD and was previously on home oxygen from which she has weaned off.  She has a history of hysterectomy, had recently a diagnosis of right lung pneumonia, She has a sinus arrhythmia with borderline left axis deviation diagnosed by Dr. Marye Round emergency room on 02 April 2018.   She had a viral upper respiratory infection with cough mid March and presented to [facility] emergency department.  She had yellowish phlegm at the time for about 2 weeks-  "he does not like to go to the hospital and waited to see if it would go away". At that time had some chills, fever and headaches and poor appetite and body aches overall.  She was not tested for Covid 19 at the time, but influenza a and b PCR was sent. Her blood glucose was repeatedly elevated. Kidney function was normal, no anemia.  Visited 03-31-2018 with heart care per Dr. Guadalupe Dawn, seen by Dr. Carlyle Dolly, MD as a  follow-up visit after a syncope.  Interestingly her past medical history did not list syncope- she presented with diastolic dysfunction, aortic regurgitation and prior syncope and frequent palpitations to the office was evaluated in January 2020 after an acute syncope event and had another episode 3 weeks prior to her visit when she became unresponsive while watching television.  Intermittently she has palpitations.  In the office orthostatic blood pressures revealed that she has SBP more than 20 points drop.  30-day monitor was recommended.  This showed normal sinus rhythm no significant arrhythmia and a referral to neurology was suggested.  On 31 March 2018  the patient brought her sister along, who reported that there were 3 or 4 syncopal events since the last office visit ( January same year, 6 weeks earlier)  they occur when the patient is sitting down - no falls resulted when she loses consciousness.    She has a history of left lung cancer, shortness of breath, palpitations depression and the anxiety as well as degenerative joint disease with arthritis, history of anemia last documented December 2019, abdominal pain, shortness of breath with exertion, palpitations, she has survived sepsis, partial bowel obstruction, acute kidney injury in December 10, 2017, or arctic dysfunction October 25, 2017, history of lower back pain since 2016, reportedly GI Bleeds-internal hemorrhoids grade 3 and grade 4 as a likely source of hematochezia in November 2015. She has positive ANA for years, liver disease- NASH?   Family medical and sleep history:  Nobody with OSA. Heart disease unspecified in her father, who also developed CKD, HTN in both parents and two sisters.   Social history: Patient has born 3 children, she is married, her husband is an alcoholic.  They have very sporadic sleep schedules which leads to her not sleeping.  She denies physical abuse and feels safe at home.  There have however been frequent verbal altercations.   The patient has a history of smoking for over 28 years " listed as former smoker " date of quitting not named. seems to have quit 23 years ago.  no tobacco use since, she does not drink Her educational level is not on file, occupational history is not on file ( she is disabled from arthritis since 2012) , caffeine use - coffee and soda. Coffee in AM 2 cups, 6 pack of Mt. Dew cans a day. She drinks moutain dew until ( PM !!!!).    Sleep habits are as follows:  She is disabled. Not working outside the home.  dinner time is 3 Pm, she is taking medication at 6 Pm and feels sleepy by 9-10 Pm." I am always in my bedroom- I do crafts and watch  Tv- there". Neither cool, not quiet and nor dark bedroom until about 10 PM t when the Tv is off. She sleeps on her side, on 2 pillows.   Asleep by 10-11 PM, Sleeps next to her husband. He reports she snores. He does as well. He reports her having apneas, pushs her to breath- He sleeps all day and all night.   She wakes up every 45 minutes and has to urinate. Trouble to fall back asleep.  No Alarm set for 6.30 AM - spontaneously waking up) and first step is to drink mountain Dew. TST is may be 6 hours.   Feels tired but can't sleep in daytime. Mind is racing- worries all the time.     Review of Systems: Out of a complete 14 system review, the patient complains of  only the following symptoms, and all other reviewed systems are negative. How likely are you to doze in the following situations: 0 = not likely, 1 = slight chance, 2 = moderate chance, 3 = high chance  Sitting and Reading? 2 Watching Television?2 Sitting inactive in a public place (theater or meeting)? 2 Lying down in the afternoon when circumstances permit? 0 Sitting and talking to someone?0 Sitting quietly after lunch without alcohol? 0 In a car, while stopped for a few minutes in traffic? 0 As a passenger in a car for an hour without a break?0  Total = 6/24 points.   Neck size - snoring, weight gain, pain, nocturia. Apnea. EDS     Social History   Socioeconomic History   Marital status: Married    Spouse name: Not on file   Number of children: 3   Years of education: Not on file   Highest education level: Not on file  Occupational History   Not on file  Social Needs   Financial resource strain: Not hard at all   Food insecurity:    Worry: Never true    Inability: Never true   Transportation needs:    Medical: No    Non-medical: No  Tobacco Use   Smoking status: Former Smoker    Packs/day: 1.50    Years: 25.00    Pack years: 37.50    Types: Cigarettes    Last attempt to quit: 04/07/1995     Years since quitting: 23.1   Smokeless tobacco: Never Used  Substance and Sexual Activity   Alcohol use: No   Drug use: No   Sexual activity: Not Currently    Birth control/protection: Surgical  Lifestyle   Physical activity:    Days per week: 0 days    Minutes per session: 0 min   Stress: Not at all  Relationships   Social connections:    Talks on phone: More than three times a week    Gets together: Three times a week    Attends religious service: 1 to 4 times per year    Active member of club or organization: No    Attends meetings of clubs or organizations: Never    Relationship status: Married   Intimate partner violence:    Fear of current or ex partner: No    Emotionally abused: No    Physically abused: No    Forced sexual activity: No  Other Topics Concern   Not on file  Social History Narrative   Not on file    Family History  Problem Relation Age of Onset   Diabetes Other    Heart disease Other        has Psychologist, forensic   Hypertension Mother    Kidney disease Mother    Hypertension Father    Kidney disease Father    Heart disease Father    Cancer Father        leukemia   Hypertension Sister    Hypertension Sister    Colon cancer Neg Hx     Past Medical History:  Diagnosis Date   Anxiety    Asthma    every now and then.  Wheezing and coughing   Cancer ([place]) 1997   left lung   Chronic abdominal pain    Chronic back pain    Chronic joint pain    Depression    HLD (hyperlipidemia)    Palpitations 01/2013   chronic, intermittent   Shortness of breath dyspnea  with exertion    Past Surgical History:  Procedure Laterality Date   ABDOMINAL HYSTERECTOMY     ABDOMINAL SURGERY     ovarian cyst removal   BIOPSY N/A 11/22/2013   Procedure: GASTRIC BIOPSY;  Surgeon: Daneil Dolin, MD;  Location: [facility];  Service: Endoscopy;  Laterality: N/A;   BLADDER SURGERY  suspension x4   x 4   CARPAL TUNNEL RELEASE Right  07/11/2015   Procedure: RIGHT CARPAL TUNNEL RELEASE;  Surgeon: Jessy Oto, MD;  Location: [place];  Service: Orthopedics;  Laterality: Right;   CHOLECYSTECTOMY N/A 04/05/2014   Procedure: LAPAROSCOPIC CHOLECYSTECTOMY;  Surgeon: Aviva Signs Md, MD;  Location: [facility];  Service: General;  Laterality: N/A;   COCCYGECTOMY N/A 04/11/2015   Procedure: COCCYGECTOMY WITH OSTEOTOMY THROUGH AREA OF DEFORMITY;  Surgeon: Jessy Oto, MD;  Location: [place];  Service: Orthopedics;  Laterality: N/A;   COLONOSCOPY     COLONOSCOPY WITH PROPOFOL N/A 11/22/2013   Dr. Gala Romney: Grade 3 and 4/internal hemorrhoidslikely source of hematochezia Normal colonoscopy otherwise.    COLONOSCOPY WITH PROPOFOL N/A 09/15/2017   Dr. Emerson Monte: Hemorrhoids, next colonoscopy 10 years   ESOPHAGOGASTRODUODENOSCOPY (EGD) WITH PROPOFOL N/A 11/22/2013   Dr. Gala Romney: Incomplete Schatzki's ring dilated and disrupted as described above.  Small hiatal hernia. Focally abnormal gastric mucosa of uncertain significance status post biopsy, reactive gastropathy, negative H.pylori   ESOPHAGOGASTRODUODENOSCOPY (EGD) WITH PROPOFOL N/A 08/04/2015   Dr. Gala Romney: mild Schatzki's ring s/p dilation, small hiatal hernia    ESOPHAGOGASTRODUODENOSCOPY (EGD) WITH PROPOFOL N/A 03/17/2017   Mild Schatki's ring s/p dilation, small hiatal hernia, otherwise normal   EXCISION VAGINAL CYST Left 06/20/2012   Procedure: EXCISION LEFT LABIAL CYST;  Surgeon: Jonnie Kind, MD;  Location: [facility];  Service: Gynecology;  Laterality: Left;   LOBECTOMY Left    LUNG CANCER SURGERY  1997   age 30, resection only   MALONEY DILATION N/A 11/22/2013   Procedure: [name] DILATION;  Surgeon: Daneil Dolin, MD;  Location: [facility];  Service: Endoscopy;  Laterality: N/A;  [place] N/A 08/04/2015   Procedure: [name] DILATION;  Surgeon: Daneil Dolin, MD;  [facility];  Service: Endoscopy;  Laterality: N/A;   MALONEY DILATION N/A  03/17/2017   Procedure: [name] DILATION;  Surgeon: Daneil Dolin, MD;  [facility];  Service: Endoscopy;  Laterality: N/A;   MASS EXCISION N/A 07/23/2014   Procedure: EXCISIONAL BIOPSY NODULE LEFT PARACOCCYGEAL AREA;  Surgeon: Jessy Oto, MD;  Location: [place];  Service: Orthopedics;  Laterality: N/A;   MASS EXCISION Left 10/10/2015   Procedure: EXCISIONAL BIOPSY OF LEFT DORSORADIAL FOREARM MASS LIPOMA;  Surgeon: Jessy Oto, MD;  Location: [place];  Service: Orthopedics;  Laterality: Left;   TUBAL LIGATION      Current Outpatient Medications  Medication Sig Dispense Refill   acyclovir (ZOVIRAX) 400 MG tablet TAKE 1 TABLET BY MOUTH TWICE DAILY (Patient taking differently: Take 400 mg by mouth 2 (two) times daily. ) 180 tablet 3   albuterol (PROVENTIL HFA;VENTOLIN HFA) 108 (90 Base) MCG/ACT inhaler Inhale 1-2 puffs into the lungs every 4 (four) hours as needed for wheezing or shortness of breath. 1 Inhaler 0   amitriptyline (ELAVIL) 150 MG tablet TAKE 1 TABLET BY MOUTH ONCE DAILY AT NIGHT 30 tablet 3   amLODipine (NORVASC) 10 MG tablet Take 1 tablet (10 mg total) by mouth daily. 90 tablet 3   atorvastatin (  LIPITOR) 40 MG tablet Take 1 tablet (40 mg total) by mouth daily. 90 tablet 3   baclofen (LIORESAL) 10 MG tablet Take 1 tablet (10 mg total) by mouth 3 (three) times daily. 30 each 0   budesonide-formoterol (SYMBICORT) 160-4.5 MCG/ACT inhaler Inhale 2 puffs into the lungs 2 (two) times daily. 1 Inhaler 3   celecoxib (CELEBREX) 200 MG capsule Take 1 capsule by mouth twice daily 60 capsule 0   cephALEXin (KEFLEX) 500 MG capsule Take 1 capsule (500 mg total) by mouth 4 (four) times daily for 5 days. 20 capsule 0   Chlorpheniramine-DM 4-30 MG TABS 1 po q6h prn cough 28 each 0   dexlansoprazole (DEXILANT) 60 MG capsule Take 1 capsule (60 mg total) by mouth daily before breakfast. 90 capsule 3   DULoxetine (CYMBALTA) 60 MG capsule Take 1 capsule (60 mg  total) by mouth 2 (two) times daily. 120 capsule 11   linaclotide (LINZESS) 290 MCG CAPS capsule Take 1 capsule (290 mcg total) by mouth daily before breakfast. 30 capsule 11   potassium chloride (KLOR-CON) 20 MEQ packet Take 20 mEq by mouth 2 (two) times daily. 6 tablet 0   pregabalin (LYRICA) 100 MG capsule Take 1 capsule (100 mg total) by mouth 3 (three) times daily. 90 capsule 0   No current facility-administered medications for this visit.     Allergies as of 05/23/2018 - Review Complete 05/22/2018  Allergen Reaction Noted   Promethazine hcl Nausea And Vomiting and Other (See Comments) 07/26/2008    Vitals: There were no vitals taken for this visit. Last Weight:  Wt Readings from Last 1 Encounters:  04/01/18 180 lb 12.4 oz (82 kg)   BMI:     Last Height:   Ht Readings from Last 1 Encounters:  04/01/18 5\' 6"  (1.676 m)   BMI was 29 at the last visit documented in EPIC.   OBSERVATION: General: The patient is awake, alert and appears not in acute distress.  Head: Normocephalic, atraumatic.  Neck with full ROM .  Mallampati 5,  neck circumference - did not take - did not understand circumference and answered her neck measured 4 or 5 inches... ,appears to have a large neck for female person. Measured 15.5 "  Nasal airflow patent  the patient was able to hold her breath for 13 sec Retrognathia is seen.  Respiratory: not SOB Skin:  Without evidence of facial  edema, or rash Trunk: BMI is . The patient's posture is mildly stooped.  Neurologic exam : The patient is awake and alert, oriented to place and time.   Attention span & concentration ability appears normal.  Speech is fluent,  With dysphonia   Cranial nerves: reports normal smell and taste sense, not impaired. Pupils are equal  Extraocular movements  in vertical and horizontal planes intact . Visual fields by finger perimetry are intact. Hearing may be impaired- patient is speaking very loud.   Facial motor  strength is symmetric and her tongue and uvula move in midline. Shoulder shrug is symmetrical.   Motor exam:  Symmetric muscle bulk and symmetric ROM in upper extremities. Sensory:  Deferred.  Coordination: Rapid alternating movements in the fingers/hands was normal. Finger-to-nose maneuver  normal without evidence of ataxia, dysmetria or tremor. Gait and station: Patient walks without assistive device, but was leaning onto a shopping cart when we first made contact.   Deep tendon reflexes: deferred.  Assessment:  After physical and neurologic observation, review of  pre-existing records as far as  provided in visit., my assessment is that of a patient with a high risk for sleep apnea.   Assessment and Plan: This patient has physical and medical risk factors for the presence of obstructive sleep apnea which include aortic regurgitation, prior syncope, irregular heartbeats, diastolic dysfunction, hypertension and hyperlipidemia as well as chronically elevated fasting blood glucose levels.  She is also orthostatic, she reports shortness of breath with dyspnea with exertion, has a history of asthma just overcome of viral pneumonia and later a UTI, making her a patient at high risk for COVID-19 for this reason she will undergo a home sleep test and not an in lab test.  I reviewed her medications which include several inhalers but also muscle relaxant such as Suzzette Righter she is also on pregabalin, duloxetine, Elavil.  I will order the home sleep test.  In addition there is a urgent need to adjust her sleep schedule, sleep routines and to establish good sleep hygiene.  I will send her a handout with a 14-day course for better sleep, I used to call this the insomnia [facility].  I will attach it to today's ASV note which will be mailed to the patient's home.  She can either pick the home sleep test up in our driveway or have it mailed to her home.  Follow Up Instructions: after HST, visit with NP.  Insomnia is related to nocturia, poor sleep hygiene ( mountain dew at 9 PM !!, and I will attach a insomnia instruction to todays ASV, being mailed to the patient.    I discussed the assessment and treatment plan with the patient. The patient was provided an opportunity to ask questions and all were answered. The patient agreed with the plan and demonstrated an understanding of the instructions.   The patient was advised to call back or seek an in-person evaluation if the symptoms worsen or if the condition fails to improve as anticipated.  I provided 45 minutes of non-face-to-face time during this encounter. There was  a lot of material to review, and the patient was limited cooperative, stretching the video and audio  quality to the limit, on the go, and late to boot. She also did not have the phone charged, and had to walk during the  Interview and exam part to retrieve this.  She truly had not prepared or thought about the consultation as a virtual visit.    Larey Seat, MD  RV with NP after HST is performed and read- will likely start on auto CPAP if diagnosed with OSA. Patient has to agree to use the machine 4 hours or more each night and to inform us if compliance is compromised, for example not using CPAP because of UTI, Sinusitis, Power outage, or intolerance to pressure-  The patient has to report these difficulties within 30 days to address mending them or is deemed non-compliant.   Larey Seat, MD 7/0/2637, 85:88 AM  Certified in Neurology by ABPN Certified in [place] by [facility] [address], [place], [place] [address]

## 2018-05-23 NOTE — Patient Instructions (Signed)
Please remember to try to maintain good sleep hygiene, which means:   Keep a regular sleep and wake schedule, try not to exercise or have a meal within 2 hours of your bedtime, try to keep your bedroom conducive for sleep, that is, cool and dark, without light distractors such as an illuminated alarm clock, and refrain from watching TV right before sleep or in the middle of the night and do not keep the TV or radio on during the night. Do not drink alcohol or caffeinated beverages before bedtime, caffeine cut -off is 8 hours prior to bedtime.  If you have depression- get treated.   Also, try not to use or play on electronic devices at bedtime, such as your cell phone, tablet PC or laptop. If you like to read at bedtime on an electronic device, try to dim the background light as much as possible. Do not eat in the middle of the night.   We will request a sleep study.    We will look for snoring or sleep apnea.   For chronic insomnia, you are best followed by a psychiatrist and/or sleep psychologist.   We will call you with the sleep study results and make a follow up appointment if needed.      Screening for Sleep Apnea  Sleep apnea is a condition in which breathing pauses or becomes shallow during sleep. Sleep apnea screening is a test to determine if you are at risk for sleep apnea. The test is easy and only takes a few minutes. Your health care provider may ask you to have this test in preparation for surgery or as part of a physical exam. What are the symptoms of sleep apnea? Common symptoms of sleep apnea include:  Snoring.  Restless sleep.  Daytime sleepiness.  Pauses in breathing.  Choking during sleep.  Irritability.  Forgetfulness.  Trouble thinking clearly.  Depression.  Personality changes. Most people with sleep apnea are not aware that they have it. Why should I get screened? Getting screened for sleep apnea can help:  Ensure your safety. It is important for  your health care providers to know whether or not you have sleep apnea, especially if you are having surgery or have other long-term (chronic) health conditions.  Improve your health and allow you to get a better night's rest. Restful sleep can help you: ? Have more energy. ? Lose weight. ? Improve high blood pressure. ? Improve diabetes management. ? Prevent stroke. ? Prevent car accidents. How is screening done? Screening usually includes being asked a list of questions about your sleep quality. Some questions you may be asked include:  Do you snore?  Is your sleep restless?  Do you have daytime sleepiness?  Has a partner or spouse told you that you stop breathing during sleep?  Have you had trouble concentrating or memory loss? If your screening test is positive, you are at risk for the condition. Further testing may be needed to confirm a diagnosis of sleep apnea. Where to find more information You can find screening tools online or at your health care clinic. For more information about sleep apnea screening and healthy sleep, visit these websites:  Centers for Disease Control and Prevention: LearningDermatology.pl  American Sleep Apnea Association: www.sleepapnea.org Contact a health care provider if:  You think that you may have sleep apnea. Summary  Sleep apnea screening can help determine if you are at risk for sleep apnea.  It is important for your health care providers to  know whether or not you have sleep apnea, especially if you are having surgery or have other chronic health conditions.  You may be asked to take a screening test for sleep apnea in preparation for surgery or as part of a physical exam. This information is not intended to replace advice given to you by your health care provider. Make sure you discuss any questions you have with your health care provider. Document Released: 04/16/2016 Document Revised: 04/16/2016 Document Reviewed: 04/16/2016  Elsevier Interactive Patient Education  2019 [employer].

## 2018-06-06 NOTE — Telephone Encounter (Signed)
Called the patient back and I have advised her that we are working through our orders in the order that the test were received. Advised the patient that since her visit we have gradually started to open our sleep lab and are bringing patients into the lab. Patient states that she would prefer to do the HST. Advised the patient the inlab is our preferred test in that it is more accurate. She states she would like to continue with HST. Advised the patient that we have a batch that is out currently and that I will make the sleep lab know so they can move forward with HST. Pt verbalized understanding.

## 2018-06-06 NOTE — Telephone Encounter (Signed)
Patient called and stated she is waiting on her sleep study test but has not received it. Please call and advise.

## 2018-06-09 ENCOUNTER — Other Ambulatory Visit: Payer: Self-pay | Admitting: Family Medicine

## 2018-06-14 ENCOUNTER — Encounter (HOSPITAL_COMMUNITY): Payer: Self-pay

## 2018-06-14 ENCOUNTER — Other Ambulatory Visit: Payer: Self-pay

## 2018-06-14 ENCOUNTER — Emergency Department (HOSPITAL_COMMUNITY)
Admission: EM | Admit: 2018-06-14 | Discharge: 2018-06-14 | Disposition: A | Discharge status: Home / Self Care | Payer: Medicare Other | Attending: Emergency Medicine | Admitting: Emergency Medicine

## 2018-06-14 ENCOUNTER — Emergency Department (HOSPITAL_COMMUNITY): Payer: Medicare Other

## 2018-06-14 DIAGNOSIS — Z87891 Personal history of nicotine dependence: Secondary | ICD-10-CM | POA: Insufficient documentation

## 2018-06-14 DIAGNOSIS — R600 Localized edema: Secondary | ICD-10-CM | POA: Diagnosis not present

## 2018-06-14 DIAGNOSIS — R220 Localized swelling, mass and lump, head: Secondary | ICD-10-CM | POA: Diagnosis not present

## 2018-06-14 DIAGNOSIS — I10 Essential (primary) hypertension: Secondary | ICD-10-CM | POA: Diagnosis not present

## 2018-06-14 DIAGNOSIS — J449 Chronic obstructive pulmonary disease, unspecified: Secondary | ICD-10-CM | POA: Insufficient documentation

## 2018-06-14 DIAGNOSIS — Z79899 Other long term (current) drug therapy: Secondary | ICD-10-CM | POA: Insufficient documentation

## 2018-06-14 DIAGNOSIS — R52 Pain, unspecified: Secondary | ICD-10-CM | POA: Diagnosis not present

## 2018-06-14 DIAGNOSIS — R221 Localized swelling, mass and lump, neck: Secondary | ICD-10-CM | POA: Diagnosis not present

## 2018-06-14 LAB — CBC WITH DIFFERENTIAL/PLATELET
Abs Immature Granulocytes: 0.01 10*3/uL (ref 0.00–0.07)
Basophils Absolute: 0 10*3/uL (ref 0.0–0.1)
Basophils Relative: 1 %
Eosinophils Absolute: 0.2 10*3/uL (ref 0.0–0.5)
Eosinophils Relative: 4 %
HCT: 39 % (ref 36.0–46.0)
Hemoglobin: 12.5 g/dL (ref 12.0–15.0)
Immature Granulocytes: 0 %
Lymphocytes Relative: 39 %
Lymphs Abs: 1.9 10*3/uL (ref 0.7–4.0)
MCH: 30 pg (ref 26.0–34.0)
MCHC: 32.1 g/dL (ref 30.0–36.0)
MCV: 93.5 fL (ref 80.0–100.0)
Monocytes Absolute: 0.4 10*3/uL (ref 0.1–1.0)
Monocytes Relative: 7 %
Neutro Abs: 2.4 10*3/uL (ref 1.7–7.7)
Neutrophils Relative %: 49 %
Platelets: 273 10*3/uL (ref 150–400)
RBC: 4.17 MIL/uL (ref 3.87–5.11)
RDW: 13 % (ref 11.5–15.5)
WBC: 5 10*3/uL (ref 4.0–10.5)
nRBC: 0 % (ref 0.0–0.2)

## 2018-06-14 LAB — BASIC METABOLIC PANEL
Anion gap: 12 (ref 5–15)
BUN: 8 mg/dL (ref 6–20)
CO2: 25 mmol/L (ref 22–32)
Calcium: 9 mg/dL (ref 8.9–10.3)
Chloride: 103 mmol/L (ref 98–111)
Creatinine, Ser: 0.87 mg/dL (ref 0.44–1.00)
GFR calc Af Amer: >60 mL/min (ref >60)
GFR calc non Af Amer: >60 mL/min (ref >60)
Glucose, Bld: 109 mg/dL — ABNORMAL HIGH (ref 70–99)
Potassium: 3.5 mmol/L (ref 3.5–5.1)
Sodium: 140 mmol/L (ref 135–145)

## 2018-06-14 MED ORDER — IOHEXOL 300 MG/ML  SOLN
75.0000 mL | Freq: Once | INTRAMUSCULAR | Status: AC | PRN
  Administered 2018-06-14: 75 mL via INTRAVENOUS

## 2018-06-14 NOTE — ED Provider Notes (Addendum)
[facility] EMERGENCY DEPARTMENT Provider Note   CSN: [id number] Arrival date & time: 06/14/18  1532    History   Chief Complaint Chief Complaint  Patient presents with  . Facial Swelling    HPI KATYRA TOMASSETTI is a 58 y.o. female.     Level 5 caveat for urgency of condition.  Patient reports right facial swelling superior to the TMJ joint approximately 1 hour ago.  No specific etiology of this complaint.  No prodromal illnesses.  No fever or chills.  She is states no diabetes.  Airway and swallowing normal.     Past Medical History:  Diagnosis Date  . Anxiety   . Asthma    every now and then.  Wheezing and coughing  . Cancer ([place]) 1997   left lung  . Chronic abdominal pain   . Chronic back pain   . Chronic joint pain   . Depression   . HLD (hyperlipidemia)   . Palpitations 01/2013   chronic, intermittent  . Shortness of breath dyspnea    with exertion    Patient Active Problem List   Diagnosis Date Noted  . Sacroiliac inflammation ([place]) 05/23/2018  . Poor sleep hygiene 05/23/2018  . Chronic obstructive pulmonary disease with (acute) exacerbation ([place]) 05/23/2018  . Orthostatic hypotension 05/23/2018  . Syncope and collapse 05/23/2018  . Elevated fasting glucose 05/23/2018  . Nocturia more than twice per night 05/23/2018  . Snoring 05/23/2018  . Hemorrhoid 02/15/2018  . Abdominal distention 02/15/2018  . Anemia 01/09/2018  . Pain of upper abdomen 01/09/2018  . Healthcare maintenance 12/30/2017  . Antibiotic-associated diarrhea 12/22/2017  . Vagina itching 12/22/2017  . Sepsis ([place])   . Respiratory distress   . Partial small bowel obstruction ([place]) 12/11/2017  . COPD with acute exacerbation ([place]) 12/11/2017  . Hypophosphatemia 12/11/2017  . Sepsis due to undetermined organism ([place]) 12/10/2017  . AKI (acute kidney injury) ([place]) 12/10/2017  . Hyperbilirubinemia 12/10/2017  . Aortic regurgitation 10/25/2017  . Diastolic dysfunction 43/32/9518  . Dysphagia  01/20/2017  . Left hip pain 01/19/2017  . Right hand pain 01/19/2017  . Diarrhea 10/11/2016  . Right hip pain 06/07/2016  . Dysuria 04/14/2016  . Malaise and fatigue 04/14/2016  . Breast pain, left 11/06/2015  . Mass of left forearm 10/10/2015    Class: Chronic  . Dysphagia, oropharyngeal   . Gastroesophageal reflux disease without esophagitis   . Carpal tunnel syndrome, right 07/11/2015    Class: Chronic  . Prediabetes 07/09/2015  . Lumbar stenosis with neurogenic claudication 01/05/2015  . Spondylolisthesis of lumbar region 01/03/2015    Class: Chronic  . Soft tissue mass 07/23/2014    Class: Acute  . Coccygodynia 07/23/2014    Class: Acute  . Constipation 02/15/2014  . Dysphagia, pharyngoesophageal phase   . Elevated alkaline phosphatase level 11/06/2013  . Rectal bleeding 11/06/2013  . Obesity, unspecified 02/15/2013  . Genital herpes 10/12/2012  . HSV (herpes simplex virus) anogenital infection 09/29/2012  . UNSPECIFIED SLEEP APNEA 12/04/2008  . ADENOCARCINOMA, LUNG 10/09/2008  . Osteoarthritis of multiple joints 07/13/2006  . HYPERCHOLESTEROLEMIA 03/17/2006  . DEPRESSION, MAJOR, RECURRENT 03/17/2006  . HYPERTENSION, BENIGN SYSTEMIC 03/17/2006  . GASTROESOPHAGEAL REFLUX, NO ESOPHAGITIS 03/17/2006  . Insomnia due to medical condition 03/17/2006    Past Surgical History:  Procedure Laterality Date  . ABDOMINAL HYSTERECTOMY    . ABDOMINAL SURGERY     ovarian cyst removal  . BIOPSY N/A 11/22/2013   Procedure: GASTRIC BIOPSY;  Surgeon: Daneil Dolin,  MD;  Location: [facility];  Service: Endoscopy;  Laterality: N/A;  . BLADDER SURGERY  suspension x4   x 4  . CARPAL TUNNEL RELEASE Right 07/11/2015   Procedure: RIGHT CARPAL TUNNEL RELEASE;  Surgeon: Jessy Oto, MD;  Location: [place];  Service: Orthopedics;  Laterality: Right;  . CHOLECYSTECTOMY N/A 04/05/2014   Procedure: LAPAROSCOPIC CHOLECYSTECTOMY;  Surgeon: Aviva Signs Md, MD;  Location: [facility];   Service: General;  Laterality: N/A;  . COCCYGECTOMY N/A 04/11/2015   Procedure: COCCYGECTOMY WITH OSTEOTOMY THROUGH AREA OF DEFORMITY;  Surgeon: Jessy Oto, MD;  Location: [place];  Service: Orthopedics;  Laterality: N/A;  . COLONOSCOPY    . COLONOSCOPY WITH PROPOFOL N/A 11/22/2013   Dr. Gala Romney: Grade 3 and 4/internal hemorrhoids?"likely source of hematochezia Normal colonoscopy otherwise.   Marland Kitchen COLONOSCOPY WITH PROPOFOL N/A 09/15/2017   Dr. Emerson Monte: Hemorrhoids, next colonoscopy 10 years  . ESOPHAGOGASTRODUODENOSCOPY (EGD) WITH PROPOFOL N/A 11/22/2013   Dr. Gala Romney: Incomplete Schatzki's ring dilated and disrupted as described above.  Small hiatal hernia. Focally abnormal gastric mucosa of uncertain significance status post biopsy, reactive gastropathy, negative H.pylori  . ESOPHAGOGASTRODUODENOSCOPY (EGD) WITH PROPOFOL N/A 08/04/2015   Dr. Gala Romney: mild Schatzki's ring s/p dilation, small hiatal hernia   . ESOPHAGOGASTRODUODENOSCOPY (EGD) WITH PROPOFOL N/A 03/17/2017   Mild Schatki's ring s/p dilation, small hiatal hernia, otherwise normal  . EXCISION VAGINAL CYST Left 06/20/2012   Procedure: EXCISION LEFT LABIAL CYST;  Surgeon: Jonnie Kind, MD;  Location: [facility];  Service: Gynecology;  Laterality: Left;  . LOBECTOMY Left   . LUNG CANCER SURGERY  1997   age 12, resection only  . MALONEY DILATION N/A 11/22/2013   Procedure: [name] DILATION;  Surgeon: Daneil Dolin, MD;  Location: [facility];  Service: Endoscopy;  Laterality: N/A;  56  . MALONEY DILATION N/A 08/04/2015   Procedure: [name] DILATION;  Surgeon: Daneil Dolin, MD;  [facility];  Service: Endoscopy;  Laterality: N/A;  [name] DILATION N/A 03/17/2017   Procedure: [name] DILATION;  Surgeon: Daneil Dolin, MD;  [facility];  Service: Endoscopy;  Laterality: N/A;  . MASS EXCISION N/A 07/23/2014   Procedure: EXCISIONAL BIOPSY NODULE LEFT PARACOCCYGEAL AREA;  Surgeon: Jessy Oto, MD;  Location: [place];  Service:  Orthopedics;  Laterality: N/A;  . MASS EXCISION Left 10/10/2015   Procedure: EXCISIONAL BIOPSY OF LEFT DORSORADIAL FOREARM MASS LIPOMA;  Surgeon: Jessy Oto, MD;  Location: [place];  Service: Orthopedics;  Laterality: Left;  . TUBAL LIGATION       OB History    Gravida  3   Para  3   Term  3   Preterm      AB      Living  3     SAB      TAB      Ectopic      Multiple      Live Births               Home Medications    Prior to Admission medications   Medication Sig Start Date End Date Taking? Authorizing Provider  acyclovir (ZOVIRAX) 400 MG tablet TAKE 1 TABLET BY MOUTH TWICE DAILY Patient taking differently: Take 400 mg by mouth 2 (two) times daily.  05/30/17   Verner Mould, MD  albuterol (PROVENTIL HFA;VENTOLIN HFA) 108 (90 Base) MCG/ACT inhaler Inhale 1-2 puffs into the lungs every 4 (four) hours as needed  for wheezing or shortness of breath. 04/01/18   Lily Kocher, PA-C  amitriptyline (ELAVIL) 150 MG tablet TAKE 1 TABLET BY MOUTH ONCE DAILY AT NIGHT 05/17/18   Guadalupe Dawn, MD  amLODipine (NORVASC) 10 MG tablet Take 1 tablet (10 mg total) by mouth daily. 10/13/17   Martyn Malay, MD  atorvastatin (LIPITOR) 40 MG tablet Take 1 tablet (40 mg total) by mouth daily. 03/23/18   Guadalupe Dawn, MD  baclofen (LIORESAL) 10 MG tablet Take 1 tablet (10 mg total) by mouth 3 (three) times daily. 02/16/18   Jessy Oto, MD  budesonide-formoterol (SYMBICORT) 160-4.5 MCG/ACT inhaler Inhale 2 puffs into the lungs 2 (two) times daily. 12/15/17   Barton Dubois, MD  celecoxib (CELEBREX) 200 MG capsule Take 1 capsule by mouth twice daily 05/18/18   Jessy Oto, MD  Chlorpheniramine-DM 4-30 MG TABS 1 po q6h prn cough 04/01/18   Lily Kocher, PA-C  dexlansoprazole (DEXILANT) 60 MG capsule Take 1 capsule (60 mg total) by mouth daily before breakfast. 09/14/17   Mahala Menghini, PA-C  DULoxetine (CYMBALTA) 60 MG capsule Take 1 capsule (60 mg  total) by mouth 2 (two) times daily. 04/24/18   Guadalupe Dawn, MD  linaclotide [facility]) 290 MCG CAPS capsule Take 1 capsule (290 mcg total) by mouth daily before breakfast. 02/15/18   Mahala Menghini, PA-C  potassium chloride (KLOR-CON) 20 MEQ packet Take 20 mEq by mouth 2 (two) times daily. 02/27/18   Rourk, [name], MD  pregabalin (LYRICA) 100 MG capsule TAKE 1 CAPSULE BY MOUTH THREE TIMES DAILY 06/09/18   Guadalupe Dawn, MD    Family History Family History  Problem Relation Age of Onset  . Diabetes Other   . Heart disease Other        has Psychologist, forensic  . Hypertension Mother   . Kidney disease Mother   . Hypertension Father   . Kidney disease Father   . Heart disease Father   . Cancer Father        leukemia  . Hypertension Sister   . Hypertension Sister   . Colon cancer Neg Hx     Social History Social History   Tobacco Use  . Smoking status: Former Smoker    Packs/day: 1.50    Years: 25.00    Pack years: 37.50    Types: Cigarettes    Last attempt to quit: 04/07/1995    Years since quitting: 23.2  . Smokeless tobacco: Never Used  Substance Use Topics  . Alcohol use: No  . Drug use: No     Allergies   Promethazine hcl   Review of Systems Review of Systems  Unable to perform ROS: Acuity of condition     Physical Exam Updated Vital Signs BP (!) 152/70   Pulse 78   Temp 98.3 F (36.8 C) (Oral)   Resp 17   Ht 5\' 5"  (1.651 m)   Wt 82.1 kg   SpO2 97%   BMI 30.12 kg/m   Physical Exam Vitals signs and nursing note reviewed.  Constitutional:      Appearance: She is well-developed.  HENT:     Head: Atraumatic.     Comments: Obvious induration and swelling superior to the right TMJ area. Eyes:     Conjunctiva/sclera: Conjunctivae normal.  Neck:     Musculoskeletal: Neck supple.  Cardiovascular:     Rate and Rhythm: Normal rate and regular rhythm.  Pulmonary:     Effort: Pulmonary effort is normal.  Breath sounds: Normal breath sounds.  Abdominal:      General: Bowel sounds are normal.     Palpations: Abdomen is soft.  Musculoskeletal: Normal range of motion.  Skin:    General: Skin is warm and dry.  Neurological:     Mental Status: She is alert and oriented to person, place, and time.  Psychiatric:        Behavior: Behavior normal.      ED Treatments / Results  Labs (all labs ordered are listed, but only abnormal results are displayed) Labs Reviewed  BASIC METABOLIC PANEL - Abnormal; Notable for the following components:      Result Value   Glucose, Bld 109 (*)    All other components within normal limits  CBC WITH DIFFERENTIAL/PLATELET    EKG None  Radiology Ct Soft Tissue Neck W Contrast  Result Date: 06/14/2018 CLINICAL DATA:  Right neck mass EXAM: CT NECK WITH CONTRAST TECHNIQUE: Multidetector CT imaging of the neck was performed using the standard protocol following the bolus administration of intravenous contrast. CONTRAST:  33mL OMNIPAQUE IOHEXOL 300 MG/ML  SOLN COMPARISON:  None. FINDINGS: PHARYNX AND LARYNX: --Nasopharynx: Fossae of [place] are clear. Normal adenoid tonsils for age. --Oral cavity and oropharynx: The palatine and lingual tonsils are normal. The visible oral cavity and floor of mouth are normal. There is a single mandibular tooth. --Hypopharynx: Normal vallecula and pyriform sinuses. --Larynx: Normal epiglottis and pre-epiglottic space. Normal aryepiglottic and vocal folds. --Retropharyngeal space: No abscess, effusion or lymphadenopathy. SALIVARY GLANDS: --Parotid: No mass lesion or inflammation. No sialolithiasis or ductal dilatation. --Submandibular: Symmetric without inflammation. No sialolithiasis or ductal dilatation. --Sublingual: Normal. No ranula or other visible lesion of the base of tongue and floor of mouth. THYROID: Normal. LYMPH NODES: No enlarged or abnormal density lymph nodes. VASCULAR: Major cervical vessels are patent. LIMITED INTRACRANIAL: Normal. VISUALIZED ORBITS: Normal.  MASTOIDS AND VISUALIZED PARANASAL SINUSES: No fluid levels or advanced mucosal thickening. No mastoid effusion. SKELETON: No bony spinal canal stenosis. No lytic or blastic lesions. UPPER CHEST: Clear. OTHER: None. IMPRESSION: Normal cervical soft tissues Electronically Signed   By: Ulyses Jarred M.D.   On: 06/14/2018 17:46   Ct Maxillofacial W Contrast  Result Date: 06/14/2018 CLINICAL DATA:  Right facial swelling EXAM: CT MAXILLOFACIAL WITH CONTRAST TECHNIQUE: Multidetector CT imaging of the maxillofacial structures was performed with intravenous contrast. Multiplanar CT image reconstructions were also generated. CONTRAST:  92mL OMNIPAQUE IOHEXOL 300 MG/ML  SOLN COMPARISON:  None. FINDINGS: Osseous: No facial fracture. Dental: Unremarkable appearance of the teeth, mandible and hard palate. Orbits: The globes are intact. Normal appearance of the intra- and extraconal fat. Symmetric extraocular muscles. Sinuses: No fluid levels or advanced mucosal thickening. Soft tissues: Normal visualized extracranial soft tissues. Limited intracranial: Normal. IMPRESSION: No facial mass or acute abnormality. Electronically Signed   By: Ulyses Jarred M.D.   On: 06/14/2018 18:43    Procedures Procedures (including critical care time)  Medications Ordered in ED Medications  iohexol (OMNIPAQUE) 300 MG/ML solution 75 mL (75 mLs Intravenous Contrast Given 06/14/18 1718)     Initial Impression / Assessment and Plan / ED Course  I have reviewed the triage vital signs and the nursing notes.  Pertinent labs & imaging results that were available during my care of the patient were reviewed by me and considered in my medical decision making (see chart for details).        We will obtain CT maxillofacial and CT soft tissue neck with contrast.  1930:   Patient rechecked.  Swelling has diminished.  CT scans reviewed.  She may have ruptured a small blood vessel in her face.  Final Clinical Impressions(s) / ED Diagnoses    Final diagnoses:  Right facial swelling    ED Discharge Orders    None       Nat Christen, MD 06/14/18 1713    Nat Christen, MD 06/14/18 [phone]

## 2018-06-14 NOTE — Discharge Instructions (Addendum)
Scan showed no obvious serious problems.  Ice pack to face.  Return if worse

## 2018-06-14 NOTE — ED Triage Notes (Signed)
Pt brought to ED via [facility] for right sided facial swelling. Pt states it started about 1 hour ago. Pt denies fever. Pt denies chipped or missing teeth.

## 2018-06-19 ENCOUNTER — Other Ambulatory Visit (INDEPENDENT_AMBULATORY_CARE_PROVIDER_SITE_OTHER): Payer: Self-pay | Admitting: Specialist

## 2018-06-20 NOTE — Telephone Encounter (Signed)
Celecoxib refill request

## 2018-07-20 ENCOUNTER — Other Ambulatory Visit: Payer: Self-pay

## 2018-07-20 ENCOUNTER — Other Ambulatory Visit: Payer: Self-pay | Admitting: Family Medicine

## 2018-07-20 ENCOUNTER — Other Ambulatory Visit (INDEPENDENT_AMBULATORY_CARE_PROVIDER_SITE_OTHER): Payer: Self-pay | Admitting: Specialist

## 2018-07-20 DIAGNOSIS — A609 Anogenital herpesviral infection, unspecified: Secondary | ICD-10-CM

## 2018-07-20 NOTE — Telephone Encounter (Signed)
Celecoxib refill request

## 2018-07-22 MED ORDER — ACYCLOVIR 400 MG PO TABS: 400.0000 mg | ORAL_TABLET | Freq: Two times a day (BID) | ORAL | 3 refills | Status: DC

## 2018-08-09 ENCOUNTER — Encounter: Payer: Self-pay | Admitting: Nurse Practitioner

## 2018-08-09 ENCOUNTER — Ambulatory Visit (INDEPENDENT_AMBULATORY_CARE_PROVIDER_SITE_OTHER): Payer: Medicare Other | Admitting: Nurse Practitioner

## 2018-08-09 ENCOUNTER — Other Ambulatory Visit: Payer: Self-pay

## 2018-08-09 ENCOUNTER — Encounter: Payer: Self-pay | Admitting: *Deleted

## 2018-08-09 ENCOUNTER — Other Ambulatory Visit: Payer: Self-pay | Admitting: *Deleted

## 2018-08-09 VITALS — BP 130/72 | HR 87 | Temp 98.4°F | Ht 65.0 in | Wt 185.2 lb

## 2018-08-09 DIAGNOSIS — K5900 Constipation, unspecified: Secondary | ICD-10-CM | POA: Diagnosis not present

## 2018-08-09 DIAGNOSIS — R1010 Upper abdominal pain, unspecified: Secondary | ICD-10-CM

## 2018-08-09 DIAGNOSIS — R1310 Dysphagia, unspecified: Secondary | ICD-10-CM

## 2018-08-09 DIAGNOSIS — R748 Abnormal levels of other serum enzymes: Secondary | ICD-10-CM

## 2018-08-09 NOTE — Patient Instructions (Signed)
Your health issues we discussed today were:   Dysphagia (swallowing difficulties) and reflux under better control: 1. Continue taking Dexilant every day 2. Avoid NSAIDs (naproxen, Naprosyn, ibuprofen, Motrin, Advil, Aleve, aspirin, BC powders, Goody powders). 3. We will schedule an upper endoscopy with possible dilation of your swallowing difficulties 4. Further recommendations to follow.  Constipation: 1. I am glad your constipation is better! 2. Continue take Linzess 290 mcg daily 3. Call us if you have any worsening or severe symptoms  Abdominal pain: 1. Because we are planning to repeat your upper endoscopy this will allow [place] to evaluate the lining of your stomach for possible causes of your upper abdominal pain when you press on it. 2. Depending on your results, we may need to do further studies such as CT imaging. 3. Further recommendations to follow-up  Elevated liver enzyme: 1. We need to recheck your liver labs.  Have your labs drawn when you are able to. 2. Further recommendations to follow-up  Overall I recommend:  1. Continue your current medications 2. Return for follow-up in 3 months 3. Call us if you have any questions or concerns.   Because of recent events of COVID-19 ("Coronavirus"), follow CDC recommendations:  Wash your hand frequently Avoid touching your face Stay away from people who are sick If you have symptoms such as fever, cough, shortness of breath then call your healthcare provider for further guidance If you are sick, STAY AT HOME unless otherwise directed by your healthcare provider. Follow directions from state and national officials regarding staying safe   At [facility] we value your feedback. You may receive a survey about your visit today. Please share your experience as we strive to create trusting relationships with our patients to provide genuine, compassionate, quality care.  We appreciate your understanding and patience  as we review any laboratory studies, imaging, and other diagnostic tests that are ordered as we care for you. Our office policy is 5 business days for review of these results, and any emergent or urgent results are addressed in a timely manner for your best interest. If you do not hear from our office in 1 week, please contact us.   We also encourage the use of MyChart, which contains your medical information for your review as well. If you are not enrolled in this feature, an access code is on this after visit summary for your convenience. Thank you for allowing [place] to be involved in your care.  It was great to see you today!  I hope you have a great summer!!

## 2018-08-09 NOTE — Assessment & Plan Note (Signed)
Elevated alkaline phosphatase when we rechecked it it was much improved but it is elevated again to 140 for 1 month after previously checked.  It is been 4 months since it was last checked.  I will put in for a basic metabolic profile to also check her status of her hypokalemia as well as an hepatic function panel.  Further recommendations to follow results.

## 2018-08-09 NOTE — Assessment & Plan Note (Signed)
Recurrent solid food and pill dysphagia.  Typically occurs with every meal.  She has needed previous EGD and dilations in the past.  At this time I think we should go ahead and schedule another upper endoscopy with possible dilation.  She has a known Schatzki's ring on last evaluation.  Further recommendations to follow.  Proceed with EGD with Dr. Gala Romney in near future: the risks, benefits, and alternatives have been discussed with the patient in detail. The patient states understanding and desires to proceed.  The patient is currently on Elavil, Cymbalta, Lyrica.  No other anticoagulants, anxiolytics, chronic pain medications, or antidepressants.  Patient is not on diabetes or iron medications.  We will plan for the procedure on propofol/MAC to promote adequate sedation as was done for her last procedure.

## 2018-08-09 NOTE — Assessment & Plan Note (Signed)
Constipation significantly improved with increase in Linzess to 290 mcg.  Recommend she continue to take this and follow-up in 3 months.

## 2018-08-09 NOTE — Assessment & Plan Note (Signed)
She describes upper abdominal/epigastric pain when she folds her arms across her upper abdomen.  She does have a chronic history of constipation, although it is much improved.  She also has a history of GERD which seems to be doing well on Dexilant.  She is having dysphasia and will need an upper endoscopy with possible dilation.  This will allow for evaluation of her esophageal and gastric mucosa for any possible etiologies surrounding her pain.  I am hesitant to check CT imaging at this time due to the nonsevere nature of the pain and other upcoming tests.  At this point recommend she continue her medications, especially Dexilant.  Follow-up in 3 months.

## 2018-08-09 NOTE — Progress Notes (Signed)
Referring Provider: Guadalupe Dawn, MD Primary Care Physician:  Guadalupe Dawn, MD Primary GI:  Dr. Gala Romney  Chief Complaint  Patient presents with  . Abdominal Pain    upper abd when she folds her arms and lays on her belly  . Dysphagia    with foods/liquids x 1 month.     HPI:   [name] is a 58 y.o. female who presents for dysphasia.  Patient was last seen in our office 02/15/2018 for rectal bleeding, constipation, hemorrhoids, abdominal distention, elevated alkaline phosphatase.  Colonoscopy up-to-date 2019 for rectal bleeding and noted hemorrhoids and recommended repeat in 10 years (2029).  Has chronic history of constipation, GERD, dysphagia with multiple dilations in the past.  Most recent EGD February 2019 with a mild Schatzki's ring status post dilation and small hiatal hernia.  Dysphasia relieved short-lived.  During recent hospitalization in November 2019 for sepsis and acute kidney injury with a partial small bowel obstruction she had abnormal LFTs that felt to be due to congestion due to sepsis.  Right upper quadrant ultrasound found liver steatosis with CBD dilation status post cholecystectomy.  Follow-up labs with elevated alk phos mildly at 156 otherwise LFTs normal.  Also is or finding of borderline lower esophageal fold thickening question reflux but no stricture and her PPI was changed to twice daily but she felt increased her constipation and she switched herself back to Dexilant.  Amitiza felt to be ineffective and previously changed to Linzess 145 mcg daily.  At her last visit she complained of a lot of rectal bleeding for 3 or 4 episodes.  No rectal pain.  Bowel movements generally unproductive, feels bloated.  Nausea but no vomiting and no heartburn.  Interested in having her hemorrhoids "taken out".  Abdominal distention that feels like "when I have small bowel obstruction".  Recommended recheck labs, abdominal x-ray, increase Linzess to 290 mcg daily, discussed [facility]  hemorrhoid banding for which she was interested and placed on the schedule.  Abdominal x-ray completed 02/22/2018 which found moderately large amount of stool in the ascending colon without evidence of bowel obstruction.  CBC and CMP updated 02/22/2018.  CBC found to be normal, CMP with hypokalemia at 3.3, alk phos improved to 129 but not normal.  Recommended recheck CMP in 3 months.  Prescription for potassium chloride provided.  CMP checked 1 month later in the hospital which found alkaline phosphatase at 144.  No other updated liver function.  Today she states she's ok overall. Has been having recurrent dysphagia with solid foods and pills; started a month ago and getting progressively worse. Denies GERD symptoms, doing well on Dexilant. She has epigastric abdominal pain when she folds her arms across her upper abdomen. Increased Linzess working "wonderful" and has regular bowel movements daily. Abdominal pain started a couple months ago. Also with bloating. Denies other abdominal pain, N/V, hematochezia, melena, fever, chills, unintentional weight loss. Denies URI or flu-like symptoms. Denies loss of sense of taste or smell. Denies chest pain, dyspnea, dizziness, lightheadedness, syncope, near syncope. Denies any other upper or lower GI symptoms.  She states she decided to hold off on hemorrhoid banding.  Past Medical History:  Diagnosis Date  . Anxiety   . Asthma    every now and then.  Wheezing and coughing  . Cancer ([place]) 1997   left lung  . Chronic abdominal pain   . Chronic back pain   . Chronic joint pain   . Depression   . HLD (  hyperlipidemia)   . Palpitations 01/2013   chronic, intermittent  . Shortness of breath dyspnea    with exertion    Past Surgical History:  Procedure Laterality Date  . ABDOMINAL HYSTERECTOMY    . ABDOMINAL SURGERY     ovarian cyst removal  . BIOPSY N/A 11/22/2013   Procedure: GASTRIC BIOPSY;  Surgeon: Daneil Dolin, MD;  Location: [facility];  Service:  Endoscopy;  Laterality: N/A;  . BLADDER SURGERY  suspension x4   x 4  . CARPAL TUNNEL RELEASE Right 07/11/2015   Procedure: RIGHT CARPAL TUNNEL RELEASE;  Surgeon: Jessy Oto, MD;  Location: [facility];  Service: Orthopedics;  Laterality: Right;  . CHOLECYSTECTOMY N/A 04/05/2014   Procedure: LAPAROSCOPIC CHOLECYSTECTOMY;  Surgeon: Aviva Signs Md, MD;  Location: [facility];  Service: General;  Laterality: N/A;  . COCCYGECTOMY N/A 04/11/2015   Procedure: COCCYGECTOMY WITH OSTEOTOMY THROUGH AREA OF DEFORMITY;  Surgeon: Jessy Oto, MD;  Location: [facility];  Service: Orthopedics;  Laterality: N/A;  . COLONOSCOPY    . COLONOSCOPY WITH PROPOFOL N/A 11/22/2013   Dr. Gala Romney: Grade 3 and 4/internal hemorrhoids?"likely source of hematochezia Normal colonoscopy otherwise.   Marland Kitchen COLONOSCOPY WITH PROPOFOL N/A 09/15/2017   Dr. Emerson Monte: Hemorrhoids, next colonoscopy 10 years  . ESOPHAGOGASTRODUODENOSCOPY (EGD) WITH PROPOFOL N/A 11/22/2013   Dr. Gala Romney: Incomplete Schatzki's ring dilated and disrupted as described above.  Small hiatal hernia. Focally abnormal gastric mucosa of uncertain significance status post biopsy, reactive gastropathy, negative H.pylori  . ESOPHAGOGASTRODUODENOSCOPY (EGD) WITH PROPOFOL N/A 08/04/2015   Dr. Gala Romney: mild Schatzki's ring s/p dilation, small hiatal hernia   . ESOPHAGOGASTRODUODENOSCOPY (EGD) WITH PROPOFOL N/A 03/17/2017   Mild Schatki's ring s/p dilation, small hiatal hernia, otherwise normal  . EXCISION VAGINAL CYST Left 06/20/2012   Procedure: EXCISION LEFT LABIAL CYST;  Surgeon: Jonnie Kind, MD;  Location: [facility];  Service: Gynecology;  Laterality: Left;  . LOBECTOMY Left   . LUNG CANCER SURGERY  1997   age 63, resection only  . MALONEY DILATION N/A 11/22/2013   Procedure: [name] DILATION;  Surgeon: Daneil Dolin, MD;  Location: [facility];  Service: Endoscopy;  Laterality: N/A;  56  . MALONEY DILATION N/A 08/04/2015   Procedure: [name] DILATION;  Surgeon: Daneil Dolin, MD;  [facility];  Service: Endoscopy;  Laterality: N/A;  [name] DILATION N/A 03/17/2017   Procedure: [name] DILATION;  Surgeon: Daneil Dolin, MD;  [facility];  Service: Endoscopy;  Laterality: N/A;  . MASS EXCISION N/A 07/23/2014   Procedure: EXCISIONAL BIOPSY NODULE LEFT PARACOCCYGEAL AREA;  Surgeon: Jessy Oto, MD;  Location: [facility];  Service: Orthopedics;  Laterality: N/A;  . MASS EXCISION Left 10/10/2015   Procedure: EXCISIONAL BIOPSY OF LEFT DORSORADIAL FOREARM MASS LIPOMA;  Surgeon: Jessy Oto, MD;  Location: [facility];  Service: Orthopedics;  Laterality: Left;  . TUBAL LIGATION      Current Outpatient Medications  Medication Sig Dispense Refill  . acyclovir (ZOVIRAX) 400 MG tablet Take 1 tablet (400 mg total) by mouth 2 (two) times daily. 180 tablet 3  . albuterol (PROVENTIL HFA;VENTOLIN HFA) 108 (90 Base) MCG/ACT inhaler Inhale 1-2 puffs into the lungs every 4 (four) hours as needed for wheezing or shortness of breath. 1 Inhaler 0  . amitriptyline (ELAVIL) 150 MG tablet TAKE 1 TABLET BY MOUTH ONCE DAILY AT NIGHT 30 tablet 3  . amLODipine (NORVASC) 10 MG tablet Take 1 tablet (10  mg total) by mouth daily. 90 tablet 3  . atorvastatin (LIPITOR) 40 MG tablet Take 1 tablet (40 mg total) by mouth daily. 90 tablet 3  . baclofen (LIORESAL) 10 MG tablet Take 1 tablet (10 mg total) by mouth 3 (three) times daily. 30 each 0  . budesonide-formoterol (SYMBICORT) 160-4.5 MCG/ACT inhaler Inhale 2 puffs into the lungs 2 (two) times daily. (Patient taking differently: Inhale 2 puffs into the lungs 2 (two) times daily as needed. ) 1 Inhaler 3  . celecoxib (CELEBREX) 200 MG capsule Take 1 capsule (200 mg total) by mouth daily. 60 capsule 0  . Chlorpheniramine-DM 4-30 MG TABS 1 po q6h prn cough 28 each 0  . dexlansoprazole (DEXILANT) 60 MG capsule Take 1 capsule (60 mg total) by mouth daily before breakfast. 90 capsule 3  . DULoxetine (CYMBALTA) 60 MG  capsule Take 1 capsule (60 mg total) by mouth 2 (two) times daily. 120 capsule 11  . linaclotide (LINZESS) 290 MCG CAPS capsule Take 1 capsule (290 mcg total) by mouth daily before breakfast. 30 capsule 11  . potassium chloride (KLOR-CON) 20 MEQ packet Take 20 mEq by mouth 2 (two) times daily. 6 tablet 0  . pregabalin (LYRICA) 100 MG capsule TAKE 1 CAPSULE BY MOUTH THREE TIMES DAILY 90 capsule 0   No current facility-administered medications for this visit.     Allergies as of 08/09/2018 - Review Complete 08/09/2018  Allergen Reaction Noted  . Promethazine hcl Nausea And Vomiting and Other (See Comments) 07/26/2008    Family History  Problem Relation Age of Onset  . Diabetes Other   . Heart disease Other        has Psychologist, forensic  . Hypertension Mother   . Kidney disease Mother   . Hypertension Father   . Kidney disease Father   . Heart disease Father   . Cancer Father        leukemia  . Hypertension Sister   . Hypertension Sister   . Colon cancer Neg Hx     Social History   Socioeconomic History  . Marital status: Married    Spouse name: Not on file  . Number of children: 3  . Years of education: Not on file  . Highest education level: Not on file  Occupational History  . Not on file  Social Needs  . Financial resource strain: Not hard at all  . Food insecurity    Worry: Never true    Inability: Never true  . Transportation needs    Medical: No    Non-medical: No  Tobacco Use  . Smoking status: Former Smoker    Packs/day: 1.50    Years: 25.00    Pack years: 37.50    Types: Cigarettes    Quit date: 04/07/1995    Years since quitting: 23.3  . Smokeless tobacco: Never Used  Substance and Sexual Activity  . Alcohol use: No  . Drug use: No  . Sexual activity: Not Currently    Birth control/protection: Surgical  Lifestyle  . Physical activity    Days per week: 0 days    Minutes per session: 0 min  . Stress: Not at all  Relationships  . Social connections     Talks on phone: More than three times a week    Gets together: Three times a week    Attends religious service: 1 to 4 times per year    Active member of club or organization: No    Attends meetings  of clubs or organizations: Never    Relationship status: Married  Other Topics Concern  . Not on file  Social History Narrative  . Not on file    Review of Systems: General: Negative for anorexia, weight loss, fever, chills, fatigue, weakness. Eyes: Negative for vision changes.  ENT: Negative for hoarseness, difficulty swallowing , nasal congestion. CV: Negative for chest pain, angina, palpitations, dyspnea on exertion, peripheral edema.  Respiratory: Negative for dyspnea at rest, dyspnea on exertion, cough, sputum, wheezing.  GI: See history of present illness. GU:  Negative for dysuria, hematuria, urinary incontinence, urinary frequency, nocturnal urination.  MS: Negative for joint pain, low back pain.  Derm: Negative for rash or itching.  Neuro: Negative for weakness, abnormal sensation, seizure, frequent headaches, memory loss, confusion.  Psych: Negative for anxiety, depression, suicidal ideation, hallucinations.  Endo: Negative for unusual weight change.  Heme: Negative for bruising or bleeding. Allergy: Negative for rash or hives.   Physical Exam: BP 130/72   Pulse 87   Temp 98.4 F (36.9 C) (Oral)   Ht _0  (1.651 m)   Wt 185 lb 3.2 oz (84 kg)   BMI 30.82 kg/m  General:   Alert and oriented. Pleasant and cooperative. Well-nourished and well-developed.  Head:  Normocephalic and atraumatic. Eyes:  Without icterus, sclera clear and conjunctiva pink.  Ears:  Normal auditory acuity. Mouth:  No deformity or lesions, oral mucosa pink.  Throat/Neck:  Supple, without mass or thyromegaly. Cardiovascular:  S1, S2 present without murmurs appreciated. Normal pulses noted. Extremities without clubbing or edema. Respiratory:  Clear to auscultation bilaterally. No wheezes, rales, or  rhonchi. No distress.  Gastrointestinal:  +BS, soft, non-tender and non-distended. No HSM noted. No guarding or rebound. No masses appreciated.  Rectal:  Deferred  Musculoskalatal:  Symmetrical without gross deformities. Normal posture. Skin:  Intact without significant lesions or rashes. Neurologic:  Alert and oriented x4;  grossly normal neurologically. Psych:  Alert and cooperative. Normal mood and affect. Heme/Lymph/Immune: No significant cervical adenopathy. No excessive bruising noted.    08/09/2018 12:07 PM   Disclaimer: This note was dictated with voice recognition software. Similar sounding words can inadvertently be transcribed and may not be corrected upon review.

## 2018-08-10 NOTE — Progress Notes (Signed)
cc'ed to pcp °

## 2018-08-14 ENCOUNTER — Encounter: Payer: Self-pay | Admitting: *Deleted

## 2018-08-14 DIAGNOSIS — R1010 Upper abdominal pain, unspecified: Secondary | ICD-10-CM | POA: Diagnosis not present

## 2018-08-14 DIAGNOSIS — R748 Abnormal levels of other serum enzymes: Secondary | ICD-10-CM | POA: Diagnosis not present

## 2018-08-14 DIAGNOSIS — R1310 Dysphagia, unspecified: Secondary | ICD-10-CM | POA: Diagnosis not present

## 2018-08-14 DIAGNOSIS — K5900 Constipation, unspecified: Secondary | ICD-10-CM | POA: Diagnosis not present

## 2018-08-15 LAB — BASIC METABOLIC PANEL
BUN/Creatinine Ratio: 6 (calc) (ref 6–22)
BUN: 6 mg/dL — ABNORMAL LOW (ref 7–25)
CO2: 28 mmol/L (ref 20–32)
Calcium: 8.7 mg/dL (ref 8.6–10.4)
Chloride: 103 mmol/L (ref 98–110)
Creat: 0.94 mg/dL (ref 0.50–1.05)
Glucose, Bld: 105 mg/dL — ABNORMAL HIGH (ref 65–99)
Potassium: 3.7 mmol/L (ref 3.5–5.3)
Sodium: 141 mmol/L (ref 135–146)

## 2018-08-15 LAB — HEPATIC FUNCTION PANEL
AG Ratio: 1.4 (calc) (ref 1.0–2.5)
ALT: 11 U/L (ref 6–29)
AST: 13 U/L (ref 10–35)
Albumin: 4.2 g/dL (ref 3.6–5.1)
Alkaline phosphatase (APISO): 159 U/L — ABNORMAL HIGH (ref 37–153)
Bilirubin, Direct: 0 mg/dL (ref 0.0–0.2)
Globulin: 3.1 g/dL (calc) (ref 1.9–3.7)
Indirect Bilirubin: 0.3 mg/dL (calc) (ref 0.2–1.2)
Total Bilirubin: 0.3 mg/dL (ref 0.2–1.2)
Total Protein: 7.3 g/dL (ref 6.1–8.1)

## 2018-08-21 ENCOUNTER — Other Ambulatory Visit (INDEPENDENT_AMBULATORY_CARE_PROVIDER_SITE_OTHER): Payer: Self-pay | Admitting: Surgery

## 2018-08-21 NOTE — Telephone Encounter (Signed)
Please advise patient that she was just seen by GI for issues with rectal bleeding, dysphagia, GERD and she is scheduled for EGD.  I am not going to prescribe oral NSAID at this time.

## 2018-08-22 NOTE — Telephone Encounter (Signed)
celecoxib refill refill

## 2018-08-24 ENCOUNTER — Other Ambulatory Visit: Payer: Self-pay | Admitting: Nurse Practitioner

## 2018-08-24 DIAGNOSIS — R748 Abnormal levels of other serum enzymes: Secondary | ICD-10-CM

## 2018-08-24 NOTE — Progress Notes (Signed)
Follow-up opn elevated alk phos.

## 2018-08-25 NOTE — Progress Notes (Signed)
LMOM to call.

## 2018-08-28 ENCOUNTER — Telehealth: Payer: Self-pay | Admitting: Internal Medicine

## 2018-08-28 NOTE — Telephone Encounter (Signed)
Routing to MS, not sure of reason for call.

## 2018-08-28 NOTE — Telephone Encounter (Signed)
Routing to MS.

## 2018-08-28 NOTE — Progress Notes (Signed)
PT is aware and will go to the lab.

## 2018-08-28 NOTE — Telephone Encounter (Signed)
[phone] patient returned call from Friday, please call back, she thinks it may have been [place]

## 2018-08-29 NOTE — Telephone Encounter (Signed)
Spoke with patient. She spoke with Tamela Oddi already

## 2018-08-30 DIAGNOSIS — R748 Abnormal levels of other serum enzymes: Secondary | ICD-10-CM | POA: Diagnosis not present

## 2018-08-30 LAB — GAMMA GT: GGT: 35 U/L (ref 3–70)

## 2018-09-03 ENCOUNTER — Encounter (HOSPITAL_COMMUNITY): Payer: Self-pay | Admitting: Emergency Medicine

## 2018-09-03 ENCOUNTER — Emergency Department (HOSPITAL_COMMUNITY)
Admission: EM | Admit: 2018-09-03 | Discharge: 2018-09-03 | Disposition: A | Discharge status: Home / Self Care | Payer: Medicare Other | Attending: Emergency Medicine | Admitting: Emergency Medicine

## 2018-09-03 ENCOUNTER — Other Ambulatory Visit: Payer: Self-pay

## 2018-09-03 DIAGNOSIS — W57XXXA Bitten or stung by nonvenomous insect and other nonvenomous arthropods, initial encounter: Secondary | ICD-10-CM | POA: Insufficient documentation

## 2018-09-03 DIAGNOSIS — L03114 Cellulitis of left upper limb: Secondary | ICD-10-CM | POA: Diagnosis not present

## 2018-09-03 DIAGNOSIS — J45909 Unspecified asthma, uncomplicated: Secondary | ICD-10-CM | POA: Insufficient documentation

## 2018-09-03 DIAGNOSIS — S50862A Insect bite (nonvenomous) of left forearm, initial encounter: Secondary | ICD-10-CM | POA: Diagnosis not present

## 2018-09-03 DIAGNOSIS — Z87891 Personal history of nicotine dependence: Secondary | ICD-10-CM | POA: Diagnosis not present

## 2018-09-03 DIAGNOSIS — Y929 Unspecified place or not applicable: Secondary | ICD-10-CM | POA: Diagnosis not present

## 2018-09-03 DIAGNOSIS — Z85118 Personal history of other malignant neoplasm of bronchus and lung: Secondary | ICD-10-CM | POA: Diagnosis not present

## 2018-09-03 DIAGNOSIS — I5030 Unspecified diastolic (congestive) heart failure: Secondary | ICD-10-CM | POA: Insufficient documentation

## 2018-09-03 DIAGNOSIS — Z79899 Other long term (current) drug therapy: Secondary | ICD-10-CM | POA: Diagnosis not present

## 2018-09-03 DIAGNOSIS — Y939 Activity, unspecified: Secondary | ICD-10-CM | POA: Diagnosis not present

## 2018-09-03 DIAGNOSIS — Y999 Unspecified external cause status: Secondary | ICD-10-CM | POA: Insufficient documentation

## 2018-09-03 MED ORDER — CEPHALEXIN 500 MG PO CAPS: 500.0000 mg | ORAL_CAPSULE | Freq: Four times a day (QID) | ORAL | 0 refills | Status: DC

## 2018-09-03 NOTE — ED Provider Notes (Signed)
[facility] EMERGENCY DEPARTMENT Provider Note   CSN: [id number] Arrival date & time: 09/03/18  1209    History   Chief Complaint Chief Complaint  Patient presents with  . Insect Bite    HPI [name] is a 58 y.o. female.     HPI   [name] is a 58 y.o. female, with a history of anxiety, presenting to the ED with an area of pain and swelling to the left forearm for the last week. Pain is mild to moderate, feels like a soreness, radiating locally in the forearm.  She notes some clear drainage from the wound in the center. No known MRSA history.  She has been applying Neosporin to the region with some improvement, but not resolution. Denies fever/chills, nausea/vomiting, numbness, weakness, joint pain or swelling, or any other complaints.     Past Medical History:  Diagnosis Date  . Anxiety   . Asthma    every now and then.  Wheezing and coughing  . Cancer ([place]) 1997   left lung  . Chronic abdominal pain   . Chronic back pain   . Chronic joint pain   . Depression   . HLD (hyperlipidemia)   . Palpitations 01/2013   chronic, intermittent  . Shortness of breath dyspnea    with exertion    Patient Active Problem List   Diagnosis Date Noted  . Sacroiliac inflammation ([place]) 05/23/2018  . Poor sleep hygiene 05/23/2018  . Chronic obstructive pulmonary disease with (acute) exacerbation ([place]) 05/23/2018  . Orthostatic hypotension 05/23/2018  . Syncope and collapse 05/23/2018  . Elevated fasting glucose 05/23/2018  . Nocturia more than twice per night 05/23/2018  . Snoring 05/23/2018  . Hemorrhoid 02/15/2018  . Abdominal distention 02/15/2018  . Anemia 01/09/2018  . Pain of upper abdomen 01/09/2018  . Healthcare maintenance 12/30/2017  . Antibiotic-associated diarrhea 12/22/2017  . Vagina itching 12/22/2017  . Sepsis ([place])   . Respiratory distress   . Partial small bowel obstruction ([place]) 12/11/2017  . COPD with acute exacerbation ([place]) 12/11/2017  .  Hypophosphatemia 12/11/2017  . Sepsis due to undetermined organism ([place]) 12/10/2017  . AKI (acute kidney injury) ([place]) 12/10/2017  . Hyperbilirubinemia 12/10/2017  . Aortic regurgitation 10/25/2017  . Diastolic dysfunction 40/34/7425  . Dysphagia 01/20/2017  . Left hip pain 01/19/2017  . Right hand pain 01/19/2017  . Diarrhea 10/11/2016  . Right hip pain 06/07/2016  . Dysuria 04/14/2016  . Malaise and fatigue 04/14/2016  . Breast pain, left 11/06/2015  . Mass of left forearm 10/10/2015    Class: Chronic  . Dysphagia, oropharyngeal   . Gastroesophageal reflux disease without esophagitis   . Carpal tunnel syndrome, right 07/11/2015    Class: Chronic  . Prediabetes 07/09/2015  . Lumbar stenosis with neurogenic claudication 01/05/2015  . Spondylolisthesis of lumbar region 01/03/2015    Class: Chronic  . Soft tissue mass 07/23/2014    Class: Acute  . Coccygodynia 07/23/2014    Class: Acute  . Constipation 02/15/2014  . Dysphagia, pharyngoesophageal phase   . Elevated alkaline phosphatase level 11/06/2013  . Rectal bleeding 11/06/2013  . Obesity, unspecified 02/15/2013  . Genital herpes 10/12/2012  . HSV (herpes simplex virus) anogenital infection 09/29/2012  . UNSPECIFIED SLEEP APNEA 12/04/2008  . ADENOCARCINOMA, LUNG 10/09/2008  . Osteoarthritis of multiple joints 07/13/2006  . HYPERCHOLESTEROLEMIA 03/17/2006  . DEPRESSION, MAJOR, RECURRENT 03/17/2006  . HYPERTENSION, BENIGN SYSTEMIC 03/17/2006  . GASTROESOPHAGEAL REFLUX, NO ESOPHAGITIS 03/17/2006  . Insomnia due to medical  condition 03/17/2006    Past Surgical History:  Procedure Laterality Date  . ABDOMINAL HYSTERECTOMY    . ABDOMINAL SURGERY     ovarian cyst removal  . BIOPSY N/A 11/22/2013   Procedure: GASTRIC BIOPSY;  Surgeon: Daneil Dolin, MD;  Location: [facility];  Service: Endoscopy;  Laterality: N/A;  . BLADDER SURGERY  suspension x4   x 4  . CARPAL TUNNEL RELEASE Right 07/11/2015   Procedure: RIGHT CARPAL  TUNNEL RELEASE;  Surgeon: Jessy Oto, MD;  Location: [place];  Service: Orthopedics;  Laterality: Right;  . CHOLECYSTECTOMY N/A 04/05/2014   Procedure: LAPAROSCOPIC CHOLECYSTECTOMY;  Surgeon: Aviva Signs Md, MD;  Location: [facility];  Service: General;  Laterality: N/A;  . COCCYGECTOMY N/A 04/11/2015   Procedure: COCCYGECTOMY WITH OSTEOTOMY THROUGH AREA OF DEFORMITY;  Surgeon: Jessy Oto, MD;  Location: [place];  Service: Orthopedics;  Laterality: N/A;  . COLONOSCOPY    . COLONOSCOPY WITH PROPOFOL N/A 11/22/2013   Dr. Gala Romney: Grade 3 and 4/internal hemorrhoids?"likely source of hematochezia Normal colonoscopy otherwise.   Marland Kitchen COLONOSCOPY WITH PROPOFOL N/A 09/15/2017   Dr. Emerson Monte: Hemorrhoids, next colonoscopy 10 years  . ESOPHAGOGASTRODUODENOSCOPY (EGD) WITH PROPOFOL N/A 11/22/2013   Dr. Gala Romney: Incomplete Schatzki's ring dilated and disrupted as described above.  Small hiatal hernia. Focally abnormal gastric mucosa of uncertain significance status post biopsy, reactive gastropathy, negative H.pylori  . ESOPHAGOGASTRODUODENOSCOPY (EGD) WITH PROPOFOL N/A 08/04/2015   Dr. Gala Romney: mild Schatzki's ring s/p dilation, small hiatal hernia   . ESOPHAGOGASTRODUODENOSCOPY (EGD) WITH PROPOFOL N/A 03/17/2017   Mild Schatki's ring s/p dilation, small hiatal hernia, otherwise normal  . EXCISION VAGINAL CYST Left 06/20/2012   Procedure: EXCISION LEFT LABIAL CYST;  Surgeon: Jonnie Kind, MD;  Location: [facility];  Service: Gynecology;  Laterality: Left;  . LOBECTOMY Left   . LUNG CANCER SURGERY  1997   age 19, resection only  . MALONEY DILATION N/A 11/22/2013   Procedure: [name] DILATION;  Surgeon: Daneil Dolin, MD;  Location: [facility];  Service: Endoscopy;  Laterality: N/A;  56  . MALONEY DILATION N/A 08/04/2015   Procedure: [name] DILATION;  Surgeon: Daneil Dolin, MD;  [facility];  Service: Endoscopy;  Laterality: N/A;  [name] DILATION N/A 03/17/2017   Procedure: [name] DILATION;   Surgeon: Daneil Dolin, MD;  [facility];  Service: Endoscopy;  Laterality: N/A;  . MASS EXCISION N/A 07/23/2014   Procedure: EXCISIONAL BIOPSY NODULE LEFT PARACOCCYGEAL AREA;  Surgeon: Jessy Oto, MD;  Location: [place];  Service: Orthopedics;  Laterality: N/A;  . MASS EXCISION Left 10/10/2015   Procedure: EXCISIONAL BIOPSY OF LEFT DORSORADIAL FOREARM MASS LIPOMA;  Surgeon: Jessy Oto, MD;  Location: [place];  Service: Orthopedics;  Laterality: Left;  . TUBAL LIGATION       OB History    Gravida  3   Para  3   Term  3   Preterm      AB      Living  3     SAB      TAB      Ectopic      Multiple      Live Births               Home Medications    Prior to Admission medications   Medication Sig Start Date End Date Taking? Authorizing Provider  acyclovir (ZOVIRAX) 400 MG tablet Take 1 tablet (400 mg  total) by mouth 2 (two) times daily. 07/22/18   Guadalupe Dawn, MD  albuterol (PROVENTIL HFA;VENTOLIN HFA) 108 (90 Base) MCG/ACT inhaler Inhale 1-2 puffs into the lungs every 4 (four) hours as needed for wheezing or shortness of breath. 04/01/18   Lily Kocher, PA-C  amitriptyline (ELAVIL) 150 MG tablet TAKE 1 TABLET BY MOUTH ONCE DAILY AT NIGHT 05/17/18   Guadalupe Dawn, MD  amLODipine (NORVASC) 10 MG tablet Take 1 tablet (10 mg total) by mouth daily. 10/13/17   Martyn Malay, MD  atorvastatin (LIPITOR) 40 MG tablet Take 1 tablet (40 mg total) by mouth daily. 03/23/18   Guadalupe Dawn, MD  baclofen (LIORESAL) 10 MG tablet Take 1 tablet (10 mg total) by mouth 3 (three) times daily. 02/16/18   Jessy Oto, MD  budesonide-formoterol (SYMBICORT) 160-4.5 MCG/ACT inhaler Inhale 2 puffs into the lungs 2 (two) times daily. Patient taking differently: Inhale 2 puffs into the lungs 2 (two) times daily as needed.  12/15/17   Barton Dubois, MD  celecoxib (CELEBREX) 200 MG capsule Take 1 capsule by mouth once daily 08/23/18   Jessy Oto, MD   cephALEXin (KEFLEX) 500 MG capsule Take 1 capsule (500 mg total) by mouth 4 (four) times daily. 09/03/18   Joy, Raquel Sarna C, PA-C  Chlorpheniramine-DM 4-30 MG TABS 1 po q6h prn cough 04/01/18   Lily Kocher, PA-C  dexlansoprazole [facility]) 60 MG capsule Take 1 capsule (60 mg total) by mouth daily before breakfast. 09/14/17   Mahala Menghini, PA-C  DULoxetine (CYMBALTA) 60 MG capsule Take 1 capsule (60 mg total) by mouth 2 (two) times daily. 04/24/18   Guadalupe Dawn, MD  linaclotide [facility]) 290 MCG CAPS capsule Take 1 capsule (290 mcg total) by mouth daily before breakfast. 02/15/18   Mahala Menghini, PA-C  potassium chloride (KLOR-CON) 20 MEQ packet Take 20 mEq by mouth 2 (two) times daily. 02/27/18   Rourk, [name], MD  pregabalin (LYRICA) 100 MG capsule TAKE 1 CAPSULE BY MOUTH THREE TIMES DAILY 07/20/18   Guadalupe Dawn, MD    Family History Family History  Problem Relation Age of Onset  . Diabetes Other   . Heart disease Other        has Psychologist, forensic  . Hypertension Mother   . Kidney disease Mother   . Hypertension Father   . Kidney disease Father   . Heart disease Father   . Cancer Father        leukemia  . Hypertension Sister   . Hypertension Sister   . Colon cancer Neg Hx     Social History Social History   Tobacco Use  . Smoking status: Former Smoker    Packs/day: 1.50    Years: 25.00    Pack years: 37.50    Types: Cigarettes    Quit date: 04/07/1995    Years since quitting: 23.4  . Smokeless tobacco: Never Used  Substance Use Topics  . Alcohol use: No  . Drug use: No     Allergies   Promethazine hcl   Review of Systems Review of Systems  Constitutional: Negative for fever.  Respiratory: Negative for shortness of breath.   Musculoskeletal: Negative for arthralgias.  Skin: Positive for color change.  Neurological: Negative for weakness and numbness.     Physical Exam Updated Vital Signs BP 131/74 (BP Location: Right Arm)   Pulse 90   Temp 98.5 F (36.9  C) (Oral)   Resp 18   SpO2 96%   Physical  Exam Vitals signs and nursing note reviewed.  Constitutional:      General: She is not in acute distress.    Appearance: She is well-developed. She is not diaphoretic.  HENT:     Head: Normocephalic and atraumatic.  Eyes:     Conjunctiva/sclera: Conjunctivae normal.  Neck:     Musculoskeletal: Neck supple.  Cardiovascular:     Rate and Rhythm: Normal rate and regular rhythm.     Pulses:          Radial pulses are 2+ on the right side and 2+ on the left side.  Pulmonary:     Effort: Pulmonary effort is normal.  Musculoskeletal:     Comments: Approximately 1.5 cm diameter region of erythema, mild swelling, and tenderness to the left posterior forearm.  No noted fluctuance.  Some drainage that appears to be consistent with serous fluid.  No noted purulence.  Full range of motion in the left elbow without pain or noted difficulty.  No swelling in the joints of the left upper extremity.  Skin:    General: Skin is warm and dry.     Coloration: Skin is not pale.  Neurological:     Mental Status: She is alert.     Comments: Sensation grossly intact to light touch through each of the nerve distributions of the bilateral upper extremities. Abduction and adduction of the fingers intact against resistance. Grip strength equal bilaterally. Supination and pronation intact against resistance. Strength 5/5 through the cardinal directions of the bilateral wrists. Strength 5/5 with flexion and extension of the bilateral elbows. Patient can touch the thumb to each one of the fingertips without difficulty.   Psychiatric:        Behavior: Behavior normal.              ED Treatments / Results  Labs (all labs ordered are listed, but only abnormal results are displayed) Labs Reviewed - No data to display  EKG None  Radiology No results found.  Procedures Ultrasound ED Soft Tissue  Date/Time: 09/03/2018 1:50 PM Performed by: Lorayne Bender, PA-C Authorized by: Lorayne Bender, PA-C   Procedure details:    Indications: localization of abscess and evaluate for cellulitis     Transverse view:  Visualized   Longitudinal view:  Visualized   Images: archived   Location:    Location: upper extremity     Location comment:  Forearm   Side:  Left Findings:     cellulitis present Comments:     Small fluid collection, but actively draining.   (including critical care time)  Medications Ordered in ED Medications - No data to display   Initial Impression / Assessment and Plan / ED Course  I have reviewed the triage vital signs and the nursing notes.  Pertinent labs & imaging results that were available during my care of the patient were reviewed by me and considered in my medical decision making (see chart for details).        Patient presents with a small area of tenderness, swelling, and erythema. Patient is nontoxic appearing, afebrile, not tachycardic, not tachypneic, not hypotensive, and is in no apparent distress. No signs of septic joint. Ultrasound shows a small fluid collection, however, patient's wound is actively draining what appears to be serous fluid, therefore I&D not immediately indicated.  She also has evidence of localized cellulitis.  Oral antibiotic therapy initiated. The patient was given instructions for home care as well as return precautions. Patient  voices understanding of these instructions, accepts the plan, and is comfortable with discharge.  Final Clinical Impressions(s) / ED Diagnoses   Final diagnoses:  Cellulitis of left upper extremity    ED Discharge Orders         Ordered    cephALEXin (KEFLEX) 500 MG capsule  4 times daily     09/03/18 [address], PA-C 09/03/18 1424    Fredia Sorrow, MD 09/03/18 1546

## 2018-09-03 NOTE — ED Notes (Signed)
Awakened Tuesday and noted a "spider bite" to her L elbow  She has applied neosporin to the area but it continues red and slightly swollen  Here for eval

## 2018-09-03 NOTE — ED Triage Notes (Signed)
Patient c/o ? Insect bite to left forearm/elbow that she noticed Tuesday. Patient reports yellow drainage and painful to the touch. Denies any fevers. No red streaking noted.

## 2018-09-03 NOTE — Discharge Instructions (Addendum)
Wound Care - After I&D of Abscess   The only reason to use bandages is to protect clothing from drainage. Bandages, if used, should be replaced daily or whenever soiled. The wound may continue to drain for the next 2-3 days.   Cleaning: Clean the wound and surrounding area gently with tap water and mild soap. Rinse well and blot dry. You may shower normally. Soaking the wound in Epsom salt baths for no more than 15 minutes 2-3 times a day may help encourage the wound to continue draining help with wound healing.  Clean the wound daily to prevent further infection. Do not use cleaners such as hydrogen peroxide or alcohol.   Scar reduction: Application of a topical antibiotic ointment, such as Neosporin, after the wound has begun to close and heal well can decrease scab formation and reduce scarring. After the wound has healed, application of ointments such as Aquaphor can also reduce scar formation.  The key to scar reduction is keeping the skin well hydrated and supple. Drinking plenty of water throughout the day (At least eight 8oz glasses of water a day) is essential to staying well hydrated.  Sun exposure: Keep the wound out of the sun. After the wound has healed, continue to protect it from the sun by wearing protective clothing or applying sunscreen.  Pain: You may use Tylenol, naproxen, or ibuprofen for pain.  Prevention: There is some people that have a predisposition to abscess formation, however, there are some things that can be done to prevent abscesses in many people.  Most abscesses form because bacteria that naturally lives on the skin gets trapped underneath the skin.  This can occur through openings too small to see. Before and after any area of skin is shaved, wax, or abraded in any manner, the area should be washed with soap and water and rinsed well.   If you are having trouble with recurrent abscesses, it may be wise to perform a chlorhexidine wash regimen.  For 1 week, wash all  of your body with chlorhexidine (available over-the-counter at most pharmacies). You may also need to reevaluate your use of daily soap as soaps with perfumes or dyes can increase the chances of infection in some people.  Follow up: Please return to the ED or go to your primary care provider in 2-3 days for a wound check to assure proper healing.  Return: Return to the ED sooner should signs of worsening infection arise, such as spreading redness, worsening puffiness/swelling, severe increase in pain, fever over 100.33F, or any other major issues.  For prescription assistance, may try using prescription discount sites or apps, such as goodrx.com

## 2018-09-03 NOTE — ED Notes (Signed)
PA in to evaluate

## 2018-09-05 ENCOUNTER — Other Ambulatory Visit: Payer: Self-pay | Admitting: Family Medicine

## 2018-09-12 ENCOUNTER — Other Ambulatory Visit: Payer: Self-pay | Admitting: Family Medicine

## 2018-09-12 DIAGNOSIS — M48062 Spinal stenosis, lumbar region with neurogenic claudication: Secondary | ICD-10-CM

## 2018-09-13 ENCOUNTER — Encounter (HOSPITAL_COMMUNITY): Payer: Self-pay

## 2018-09-14 ENCOUNTER — Other Ambulatory Visit: Payer: Self-pay

## 2018-09-14 ENCOUNTER — Encounter (HOSPITAL_COMMUNITY): Admission: RE | Admit: 2018-09-14 | Payer: Medicare Other | Source: Ambulatory Visit

## 2018-09-14 ENCOUNTER — Other Ambulatory Visit (HOSPITAL_COMMUNITY)
Admission: RE | Admit: 2018-09-14 | Discharge: 2018-09-14 | Disposition: A | Discharge status: Home / Self Care | Payer: Medicare Other | Source: Ambulatory Visit | Attending: Internal Medicine | Admitting: Internal Medicine

## 2018-09-14 ENCOUNTER — Encounter (HOSPITAL_COMMUNITY)
Admission: RE | Admit: 2018-09-14 | Discharge: 2018-09-14 | Disposition: A | Discharge status: Home / Self Care | Payer: Medicare Other | Source: Ambulatory Visit | Attending: Internal Medicine | Admitting: Internal Medicine

## 2018-09-14 DIAGNOSIS — Z01812 Encounter for preprocedural laboratory examination: Secondary | ICD-10-CM | POA: Insufficient documentation

## 2018-09-14 DIAGNOSIS — Z20828 Contact with and (suspected) exposure to other viral communicable diseases: Secondary | ICD-10-CM | POA: Diagnosis not present

## 2018-09-14 LAB — SARS CORONAVIRUS 2 (TAT 6-24 HRS): SARS Coronavirus 2: NEGATIVE

## 2018-09-18 ENCOUNTER — Ambulatory Visit (HOSPITAL_COMMUNITY): Payer: Medicare Other | Admitting: Anesthesiology

## 2018-09-18 ENCOUNTER — Ambulatory Visit (HOSPITAL_COMMUNITY)
Admission: RE | Admit: 2018-09-18 | Discharge: 2018-09-18 | Disposition: A | Discharge status: Home / Self Care | Payer: Medicare Other | Attending: Internal Medicine | Admitting: Internal Medicine

## 2018-09-18 ENCOUNTER — Other Ambulatory Visit: Payer: Self-pay

## 2018-09-18 ENCOUNTER — Encounter (HOSPITAL_COMMUNITY): Payer: Self-pay | Admitting: Anesthesiology

## 2018-09-18 ENCOUNTER — Encounter (HOSPITAL_COMMUNITY)
Admission: RE | Disposition: A | Discharge status: Home / Self Care | Payer: Self-pay | Source: Home / Self Care | Attending: Internal Medicine

## 2018-09-18 DIAGNOSIS — F419 Anxiety disorder, unspecified: Secondary | ICD-10-CM | POA: Insufficient documentation

## 2018-09-18 DIAGNOSIS — K219 Gastro-esophageal reflux disease without esophagitis: Secondary | ICD-10-CM | POA: Diagnosis not present

## 2018-09-18 DIAGNOSIS — Z902 Acquired absence of lung [part of]: Secondary | ICD-10-CM | POA: Diagnosis not present

## 2018-09-18 DIAGNOSIS — Z9049 Acquired absence of other specified parts of digestive tract: Secondary | ICD-10-CM | POA: Insufficient documentation

## 2018-09-18 DIAGNOSIS — F329 Major depressive disorder, single episode, unspecified: Secondary | ICD-10-CM | POA: Insufficient documentation

## 2018-09-18 DIAGNOSIS — Z841 Family history of disorders of kidney and ureter: Secondary | ICD-10-CM | POA: Diagnosis not present

## 2018-09-18 DIAGNOSIS — Z806 Family history of leukemia: Secondary | ICD-10-CM | POA: Diagnosis not present

## 2018-09-18 DIAGNOSIS — Z8249 Family history of ischemic heart disease and other diseases of the circulatory system: Secondary | ICD-10-CM | POA: Diagnosis not present

## 2018-09-18 DIAGNOSIS — Z90710 Acquired absence of both cervix and uterus: Secondary | ICD-10-CM | POA: Diagnosis not present

## 2018-09-18 DIAGNOSIS — Z85118 Personal history of other malignant neoplasm of bronchus and lung: Secondary | ICD-10-CM | POA: Insufficient documentation

## 2018-09-18 DIAGNOSIS — K222 Esophageal obstruction: Secondary | ICD-10-CM

## 2018-09-18 DIAGNOSIS — K449 Diaphragmatic hernia without obstruction or gangrene: Secondary | ICD-10-CM

## 2018-09-18 DIAGNOSIS — M1990 Unspecified osteoarthritis, unspecified site: Secondary | ICD-10-CM | POA: Diagnosis not present

## 2018-09-18 DIAGNOSIS — I10 Essential (primary) hypertension: Secondary | ICD-10-CM | POA: Diagnosis not present

## 2018-09-18 DIAGNOSIS — Z833 Family history of diabetes mellitus: Secondary | ICD-10-CM | POA: Insufficient documentation

## 2018-09-18 DIAGNOSIS — J45909 Unspecified asthma, uncomplicated: Secondary | ICD-10-CM | POA: Diagnosis not present

## 2018-09-18 DIAGNOSIS — D649 Anemia, unspecified: Secondary | ICD-10-CM | POA: Insufficient documentation

## 2018-09-18 DIAGNOSIS — Z87891 Personal history of nicotine dependence: Secondary | ICD-10-CM | POA: Diagnosis not present

## 2018-09-18 DIAGNOSIS — G709 Myoneural disorder, unspecified: Secondary | ICD-10-CM | POA: Insufficient documentation

## 2018-09-18 DIAGNOSIS — Z7951 Long term (current) use of inhaled steroids: Secondary | ICD-10-CM | POA: Diagnosis not present

## 2018-09-18 DIAGNOSIS — Z79899 Other long term (current) drug therapy: Secondary | ICD-10-CM | POA: Diagnosis not present

## 2018-09-18 DIAGNOSIS — R002 Palpitations: Secondary | ICD-10-CM | POA: Diagnosis not present

## 2018-09-18 DIAGNOSIS — Z888 Allergy status to other drugs, medicaments and biological substances status: Secondary | ICD-10-CM | POA: Diagnosis not present

## 2018-09-18 DIAGNOSIS — R1310 Dysphagia, unspecified: Secondary | ICD-10-CM | POA: Insufficient documentation

## 2018-09-18 DIAGNOSIS — E785 Hyperlipidemia, unspecified: Secondary | ICD-10-CM | POA: Insufficient documentation

## 2018-09-18 HISTORY — PX: MALONEY DILATION: SHX5535

## 2018-09-18 HISTORY — PX: ESOPHAGOGASTRODUODENOSCOPY (EGD) WITH PROPOFOL: SHX5813

## 2018-09-18 SURGERY — ESOPHAGOGASTRODUODENOSCOPY (EGD) WITH PROPOFOL
Anesthesia: General

## 2018-09-18 MED ORDER — HYDROMORPHONE HCL 1 MG/ML IJ SOLN: 0.2500 mg | INTRAMUSCULAR | Status: DC | PRN

## 2018-09-18 MED ORDER — LACTATED RINGERS IV SOLN
INTRAVENOUS | Status: DC
  Administered 2018-09-18: 09:00:00 via INTRAVENOUS

## 2018-09-18 MED ORDER — CHLORHEXIDINE GLUCONATE CLOTH 2 % EX PADS: 6.0000 | MEDICATED_PAD | Freq: Once | CUTANEOUS | Status: DC

## 2018-09-18 MED ORDER — KETAMINE HCL 10 MG/ML IJ SOLN
INTRAMUSCULAR | Status: DC | PRN
  Administered 2018-09-18: 10 mg via INTRAVENOUS

## 2018-09-18 MED ORDER — PROPOFOL 500 MG/50ML IV EMUL
INTRAVENOUS | Status: DC | PRN
  Administered 2018-09-18: 150 ug/kg/min via INTRAVENOUS

## 2018-09-18 MED ORDER — MIDAZOLAM HCL 2 MG/2ML IJ SOLN: 0.5000 mg | Freq: Once | INTRAMUSCULAR | Status: DC | PRN

## 2018-09-18 MED ORDER — KETAMINE HCL 50 MG/5ML IJ SOSY
PREFILLED_SYRINGE | INTRAMUSCULAR | Status: AC
  Filled 2018-09-18: qty 5

## 2018-09-18 MED ORDER — HYDROCODONE-ACETAMINOPHEN 7.5-325 MG PO TABS: 1.0000 | ORAL_TABLET | Freq: Once | ORAL | Status: DC | PRN

## 2018-09-18 MED ORDER — PROPOFOL 10 MG/ML IV BOLUS
INTRAVENOUS | Status: DC | PRN
  Administered 2018-09-18: 20 mg via INTRAVENOUS
  Administered 2018-09-18: 30 mg via INTRAVENOUS
  Administered 2018-09-18 (×2): 20 mg via INTRAVENOUS

## 2018-09-18 NOTE — Transfer of Care (Signed)
Immediate Anesthesia Transfer of Care Note  Patient: [name]  Procedure(s) Performed: ESOPHAGOGASTRODUODENOSCOPY (EGD) WITH PROPOFOL (N/A ) MALONEY DILATION (N/A )  Patient Location: PACU  Anesthesia Type:General  Level of Consciousness: awake  Airway & Oxygen Therapy: Patient Spontanous Breathing  Post-op Assessment: Report given to RN  Post vital signs: Reviewed  Last Vitals:  Vitals Value Taken Time  BP 111/51 09/18/18 1120  Temp    Pulse 80 09/18/18 1123  Resp 16 09/18/18 1123  SpO2 94 % 09/18/18 1123  Vitals shown include unvalidated device data.  Last Pain:  Vitals:   09/18/18 1048  TempSrc:   PainSc: 0-No pain      Patients Stated Pain Goal: 6 (72/18/28 8337)  Complications: No apparent anesthesia complications

## 2018-09-18 NOTE — Anesthesia Preprocedure Evaluation (Signed)
Anesthesia Evaluation  Patient identified by MRN, date of birth, ID band Patient awake    Reviewed: Allergy & Precautions, NPO status , Patient's Chart, lab work & pertinent test results  Airway Mallampati: III  TM Distance: >3 FB Neck ROM: Full    Dental no notable dental hx. (+) Edentulous Upper, Edentulous Lower One lower tooth :   Pulmonary shortness of breath and with exertion, asthma , COPD,  COPD inhaler, former smoker,  Denies OSA or CPAP use  S/p Lung resection 1997 for CA    Pulmonary exam normal breath sounds clear to auscultation       Cardiovascular Exercise Tolerance: Good hypertension, Normal cardiovascular examI+ Valvular Problems/Murmurs AI  Rhythm:Regular Rate:Normal     Neuro/Psych Anxiety Depression  Neuromuscular disease negative psych ROS   GI/Hepatic Neg liver ROS, GERD  Medicated and Controlled,  Endo/Other  negative endocrine ROS  Renal/GU Renal InsufficiencyRenal disease  negative genitourinary   Musculoskeletal  (+) Arthritis ,   Abdominal   Peds negative pediatric ROS (+)  Hematology negative hematology ROS (+) anemia ,   Anesthesia Other Findings   Reproductive/Obstetrics negative OB ROS                             Anesthesia Physical Anesthesia Plan  ASA: III  Anesthesia Plan: General   Post-op Pain Management:    Induction: Intravenous  PONV Risk Score and Plan: 3 and Propofol infusion, TIVA, Treatment may vary due to age or medical condition and Ondansetron  Airway Management Planned: Nasal Cannula and Simple Face Mask  Additional Equipment:   Intra-op Plan:   Post-operative Plan:   Informed Consent: I have reviewed the patients History and Physical, chart, labs and discussed the procedure including the risks, benefits and alternatives for the proposed anesthesia with the patient or authorized representative who has indicated his/her  understanding and acceptance.     Dental advisory given  Plan Discussed with: CRNA  Anesthesia Plan Comments: (Plan Full PPE use  Plan GA with GETA as needed d/w pt -WTP with same after Q&A)        Anesthesia Quick Evaluation

## 2018-09-18 NOTE — Addendum Note (Signed)
Addendum  created 09/18/18 1222 by Ollen Bowl, CRNA   Charge Capture section accepted

## 2018-09-18 NOTE — H&P (Signed)
@LOGO @   Primary Care Physician:  Guadalupe Dawn, MD Primary Gastroenterologist:  Dr. Gala Romney  Pre-Procedure History & Physical: HPI:  [name] is a 58 y.o. female here for EGD with possible esophageal dilation.  History Schatzki's ring.  Dilation helped a little over a year and a half ago (47F [place]);   Past Medical History:  Diagnosis Date  . Anxiety   . Asthma    every now and then.  Wheezing and coughing  . Cancer ([place]) 1997   left lung  . Chronic abdominal pain   . Chronic back pain   . Chronic joint pain   . Depression   . HLD (hyperlipidemia)   . Palpitations 01/2013   chronic, intermittent  . Shortness of breath dyspnea    with exertion    Past Surgical History:  Procedure Laterality Date  . ABDOMINAL HYSTERECTOMY    . ABDOMINAL SURGERY     ovarian cyst removal  . BIOPSY N/A 11/22/2013   Procedure: GASTRIC BIOPSY;  Surgeon: Daneil Dolin, MD;  Location: [facility];  Service: Endoscopy;  Laterality: N/A;  . BLADDER SURGERY  suspension x4   x 4  . CARPAL TUNNEL RELEASE Right 07/11/2015   Procedure: RIGHT CARPAL TUNNEL RELEASE;  Surgeon: Jessy Oto, MD;  Location: [facility];  Service: Orthopedics;  Laterality: Right;  . CHOLECYSTECTOMY N/A 04/05/2014   Procedure: LAPAROSCOPIC CHOLECYSTECTOMY;  Surgeon: Aviva Signs Md, MD;  Location: [facility];  Service: General;  Laterality: N/A;  . COCCYGECTOMY N/A 04/11/2015   Procedure: COCCYGECTOMY WITH OSTEOTOMY THROUGH AREA OF DEFORMITY;  Surgeon: Jessy Oto, MD;  Location: [place];  Service: Orthopedics;  Laterality: N/A;  . COLONOSCOPY    . COLONOSCOPY WITH PROPOFOL N/A 11/22/2013   Dr. Gala Romney: Grade 3 and 4/internal hemorrhoids?"likely source of hematochezia Normal colonoscopy otherwise.   Marland Kitchen COLONOSCOPY WITH PROPOFOL N/A 09/15/2017   Dr. Emerson Monte: Hemorrhoids, next colonoscopy 10 years  . ESOPHAGOGASTRODUODENOSCOPY (EGD) WITH PROPOFOL N/A 11/22/2013   Dr. Gala Romney: Incomplete Schatzki's ring dilated and disrupted  as described above.  Small hiatal hernia. Focally abnormal gastric mucosa of uncertain significance status post biopsy, reactive gastropathy, negative H.pylori  . ESOPHAGOGASTRODUODENOSCOPY (EGD) WITH PROPOFOL N/A 08/04/2015   Dr. Gala Romney: mild Schatzki's ring s/p dilation, small hiatal hernia   . ESOPHAGOGASTRODUODENOSCOPY (EGD) WITH PROPOFOL N/A 03/17/2017   Mild Schatki's ring s/p dilation, small hiatal hernia, otherwise normal  . EXCISION VAGINAL CYST Left 06/20/2012   Procedure: EXCISION LEFT LABIAL CYST;  Surgeon: Jonnie Kind, MD;  Location: [facility];  Service: Gynecology;  Laterality: Left;  . LOBECTOMY Left   . LUNG CANCER SURGERY  1997   age 33, resection only  . MALONEY DILATION N/A 11/22/2013   Procedure: [name] DILATION;  Surgeon: Daneil Dolin, MD;  Location: [facility];  Service: Endoscopy;  Laterality: N/A;  56  . MALONEY DILATION N/A 08/04/2015   Procedure: [name] DILATION;  Surgeon: Daneil Dolin, MD;  [facility];  Service: Endoscopy;  Laterality: N/A;  [name] DILATION N/A 03/17/2017   Procedure: [name] DILATION;  Surgeon: Daneil Dolin, MD;  [facility];  Service: Endoscopy;  Laterality: N/A;  . MASS EXCISION N/A 07/23/2014   Procedure: EXCISIONAL BIOPSY NODULE LEFT PARACOCCYGEAL AREA;  Surgeon: Jessy Oto, MD;  Location: [place];  Service: Orthopedics;  Laterality: N/A;  . MASS EXCISION Left 10/10/2015   Procedure: EXCISIONAL BIOPSY OF LEFT DORSORADIAL FOREARM MASS LIPOMA;  Surgeon: Jessy Oto,  MD;  Location: [place];  Service: Orthopedics;  Laterality: Left;  . TUBAL LIGATION      Prior to Admission medications   Medication Sig Start Date End Date Taking? Authorizing Provider  acyclovir (ZOVIRAX) 400 MG tablet Take 1 tablet (400 mg total) by mouth 2 (two) times daily. 07/22/18  Yes Guadalupe Dawn, MD  albuterol (PROVENTIL HFA;VENTOLIN HFA) 108 (90 Base) MCG/ACT inhaler Inhale 1-2 puffs into the lungs every 4 (four) hours as  needed for wheezing or shortness of breath. 04/01/18  Yes Lily Kocher, PA-C  amitriptyline (ELAVIL) 150 MG tablet TAKE 1 TABLET BY MOUTH ONCE DAILY AT NIGHT 09/12/18  Yes Guadalupe Dawn, MD  amLODipine (NORVASC) 10 MG tablet Take 1 tablet (10 mg total) by mouth daily. 10/13/17  Yes Martyn Malay, MD  atorvastatin (LIPITOR) 40 MG tablet Take 1 tablet (40 mg total) by mouth daily. 03/23/18  Yes Guadalupe Dawn, MD  budesonide-formoterol [facility]) 160-4.5 MCG/ACT inhaler Inhale 2 puffs into the lungs 2 (two) times daily. Patient taking differently: Inhale 2 puffs into the lungs 2 (two) times daily as needed (respiratory issues.).  12/15/17  Yes Barton Dubois, MD  celecoxib (CELEBREX) 200 MG capsule Take 1 capsule by mouth once daily Patient taking differently: Take 200 mg by mouth daily.  08/23/18  Yes Jessy Oto, MD  dexlansoprazole (DEXILANT) 60 MG capsule Take 1 capsule (60 mg total) by mouth daily before breakfast. 09/14/17  Yes Mahala Menghini, PA-C  DULoxetine (CYMBALTA) 60 MG capsule Take 1 capsule (60 mg total) by mouth 2 (two) times daily. 04/24/18  Yes Guadalupe Dawn, MD  linaclotide [facility]) 290 MCG CAPS capsule Take 1 capsule (290 mcg total) by mouth daily before breakfast. 02/15/18  Yes Mahala Menghini, PA-C  pregabalin (LYRICA) 100 MG capsule TAKE 1 CAPSULE BY MOUTH THREE TIMES DAILY Patient taking differently: Take 100 mg by mouth 3 (three) times daily.  09/05/18  Yes Guadalupe Dawn, MD  cephALEXin (KEFLEX) 500 MG capsule Take 1 capsule (500 mg total) by mouth 4 (four) times daily. Patient not taking: Reported on 09/11/2018 09/03/18   Lorayne Bender, PA-C  Chlorpheniramine-DM 4-30 MG TABS 1 po q6h prn cough Patient not taking: Reported on 09/11/2018 04/01/18   Lily Kocher, PA-C    Allergies as of 08/09/2018 - Review Complete 08/09/2018  Allergen Reaction Noted  . Promethazine hcl Nausea And Vomiting and Other (See Comments) 07/26/2008    Family History  Problem Relation Age of  Onset  . Diabetes Other   . Heart disease Other        has Psychologist, forensic  . Hypertension Mother   . Kidney disease Mother   . Hypertension Father   . Kidney disease Father   . Heart disease Father   . Cancer Father        leukemia  . Hypertension Sister   . Hypertension Sister   . Colon cancer Neg Hx     Social History   Socioeconomic History  . Marital status: Married    Spouse name: Not on file  . Number of children: 3  . Years of education: Not on file  . Highest education level: Not on file  Occupational History  . Not on file  Social Needs  . Financial resource strain: Not hard at all  . Food insecurity    Worry: Never true    Inability: Never true  . Transportation needs    Medical: No    Non-medical: No  Tobacco Use  . Smoking status: Former Smoker    Packs/day: 1.50    Years: 25.00    Pack years: 37.50    Types: Cigarettes    Quit date: 04/07/1995    Years since quitting: 23.4  . Smokeless tobacco: Never Used  Substance and Sexual Activity  . Alcohol use: No  . Drug use: No  . Sexual activity: Not Currently    Birth control/protection: Surgical  Lifestyle  . Physical activity    Days per week: 0 days    Minutes per session: 0 min  . Stress: Not at all  Relationships  . Social connections    Talks on phone: More than three times a week    Gets together: Three times a week    Attends religious service: 1 to 4 times per year    Active member of club or organization: No    Attends meetings of clubs or organizations: Never    Relationship status: Married  . Intimate partner violence    Fear of current or ex partner: No    Emotionally abused: No    Physically abused: No    Forced sexual activity: No  Other Topics Concern  . Not on file  Social History Narrative  . Not on file    Review of Systems: See HPI, otherwise negative ROS  Physical Exam: Pulse 78   Temp 98.3 F (36.8 C) (Oral)   Resp 16   Ht 5\' 6"  (1.676 m)   Wt 81.6 kg   SpO2 96%    BMI 29.05 kg/m  General:   Alert,  Well-developed, well-nourished, pleasant and cooperative in NAD Neck:  Supple; no masses or thyromegaly. No significant cervical adenopathy. Lungs:  Clear throughout to auscultation.   No wheezes, crackles, or rhonchi. No acute distress. Heart:  Regular rate and rhythm; no murmurs, clicks, rubs,  or gallops. Abdomen: Non-distended, normal bowel sounds.  Soft and nontender without appreciable mass or hepatosplenomegaly.  Pulses:  Normal pulses noted. Extremities:  Without clubbing or edema.  Impression: 58 year old lady with a Schatzki's ring.  Recurrent esophageal dysphagia.  EGD with esophageal dilation today as feasible/appropriate per plan.  The risks, benefits, limitations, alternatives and imponderables have been reviewed with the patient. Potential for esophageal dilation, biopsy, etc. have also been reviewed.  Questions have been answered. All parties agreeable.    Notice: This dictation was prepared with Dragon dictation along with smaller phrase technology. Any transcriptional errors that result from this process are unintentional and may not be corrected upon review.

## 2018-09-18 NOTE — Discharge Instructions (Signed)
EGD Discharge instructions Please read the instructions outlined below and refer to this sheet in the next few weeks. These discharge instructions provide you with general information on caring for yourself after you leave the hospital. Your doctor may also give you specific instructions. While your treatment has been planned according to the most current medical practices available, unavoidable complications occasionally occur. If you have any problems or questions after discharge, please call your doctor. ACTIVITY  You may resume your regular activity but move at a slower pace for the next 24 hours.   Take frequent rest periods for the next 24 hours.   Walking will help expel (get rid of) the air and reduce the bloated feeling in your abdomen.   No driving for 24 hours (because of the anesthesia (medicine) used during the test).   You may shower.   Do not sign any important legal documents or operate any machinery for 24 hours (because of the anesthesia used during the test).  NUTRITION  Drink plenty of fluids.   You may resume your normal diet.   Begin with a light meal and progress to your normal diet.   Avoid alcoholic beverages for 24 hours or as instructed by your caregiver.  MEDICATIONS  You may resume your normal medications unless your caregiver tells you otherwise.  WHAT YOU CAN EXPECT TODAY  You may experience abdominal discomfort such as a feeling of fullness or gas pains.  FOLLOW-UP  Your doctor will discuss the results of your test with you.  SEEK IMMEDIATE MEDICAL ATTENTION IF ANY OF THE FOLLOWING OCCUR:  Excessive nausea (feeling sick to your stomach) and/or vomiting.   Severe abdominal pain and distention (swelling).   Trouble swallowing.   Temperature over 101 F (37.8 C).   Rectal bleeding or vomiting of blood.    Continue Dexilant 60 mg daily  I called Cindee Salt [phone].  Was unable to reach party to review findings.  Office visit with [place] in  3 months

## 2018-09-18 NOTE — Anesthesia Postprocedure Evaluation (Signed)
Anesthesia Post Note  Patient: [name]  Procedure(s) Performed: ESOPHAGOGASTRODUODENOSCOPY (EGD) WITH PROPOFOL (N/A ) MALONEY DILATION (N/A )  Patient location during evaluation: PACU Anesthesia Type: General Level of consciousness: awake and alert and oriented Pain management: pain level controlled Vital Signs Assessment: post-procedure vital signs reviewed and stable Respiratory status: spontaneous breathing Cardiovascular status: blood pressure returned to baseline Postop Assessment: no apparent nausea or vomiting Anesthetic complications: no     Last Vitals:  Vitals:   09/18/18 0852 09/18/18 1120  BP:  (!) 111/51  Pulse: 78 83  Resp: 16 (!) 21  Temp: 36.8 C (P) 36.7 C  SpO2: 96% 95%    Last Pain:  Vitals:   09/18/18 1048  TempSrc:   PainSc: 0-No pain                 Kedar Sedano

## 2018-09-18 NOTE — Op Note (Signed)
[facility] Patient Name: [name] Procedure Date: 09/18/2018 10:26 AM MRN: [id number] Date of Birth: Jan 14, 1961 Attending MD: Norvel Richards , MD CSN: [id number] Age: 58 Admit Type: Outpatient Procedure:                Upper GI endoscopy Indications:              Dysphagia Providers:                Norvel Richards, MD, Jeanann Lewandowsky. Sharon Seller, RN,                            Randa Spike, Technician Referring MD:              Medicines:                Propofol per Anesthesia Complications:            No immediate complications. Estimated Blood Loss:     Estimated blood loss was minimal. Procedure:                Pre-Anesthesia Assessment:                           - Prior to the procedure, a History and Physical                            was performed, and patient medications and                            allergies were reviewed. The patient's tolerance of                            previous anesthesia was also reviewed. The risks                            and benefits of the procedure and the sedation                            options and risks were discussed with the patient.                            All questions were answered, and informed consent                            was obtained. Prior Anticoagulants: The patient has                            taken no previous anticoagulant or antiplatelet                            agents. ASA Grade Assessment: II - A patient with                            mild systemic disease. After reviewing the risks  and benefits, the patient was deemed in                            satisfactory condition to undergo the procedure.                           After obtaining informed consent, the endoscope was                            passed under direct vision. Throughout the                            procedure, the patient's blood pressure, pulse, and                            oxygen saturations  were monitored continuously. The                            GIF-H190 ([id number]) scope was introduced through the                            mouth, and advanced to the second part of duodenum.                            The upper GI endoscopy was accomplished without                            difficulty. The patient tolerated the procedure                            well. Scope In: 10:56:54 AM Scope Out: 11:10:39 AM Total Procedure Duration: 0 hours 13 minutes 45 seconds  Findings:      A mild Schatzki ring was found at the gastroesophageal junction. The       scope was withdrawn. Dilation was performed with a Maloney dilator with       mild resistance at 56 Fr. The scope was withdrawn. Dilation was       performed with a Maloney dilator with mild resistance at 26 Fr. The       dilation site was examined following endoscope reinsertion and showed       mild mucosal disruption. Estimated blood loss was minimal. Ring remained       more or less remained intact. It was further disrupted with       four-quadrant "bites" utilizing the biopsy forceps. Was done effectively       without apparent complication. Minimal blood loss.      A small hiatal hernia was present.      The exam was otherwise without abnormality.      The duodenal bulb and second portion of the duodenum were normal. Impression:               - Mild Schatzki ring. Elated and disrupted as                            described above                           -  Small hiatal hernia.                           - The examination was otherwise normal.                           - Normal duodenal bulb and second portion of the                            duodenum.                           - No specimens collected. Moderate Sedation:      Moderate (conscious) sedation was personally administered by an       anesthesia professional. The following parameters were monitored: oxygen       saturation, heart rate, blood pressure, respiratory  rate, EKG, adequacy       of pulmonary ventilation, and response to care. Recommendation:           - Patient has a contact number available for                            emergencies. The signs and symptoms of potential                            delayed complications were discussed with the                            patient. Return to normal activities tomorrow.                            Written discharge instructions were provided to the                            patient.                           - Advance diet as tolerated. Continue Dexilant 60                            mg Daily. Office visit with [place] in 3 months. Procedure Code(s):        --- Professional ---                           [phone], Esophagogastroduodenoscopy, flexible,                            transoral; diagnostic, including collection of                            specimen(s) by brushing or washing, when performed                            (separate procedure)                           43450,  Dilation of esophagus, by unguided sound or                            bougie, single or multiple passes Diagnosis Code(s):        --- Professional ---                           K22.2, Esophageal obstruction                           K44.9, Diaphragmatic hernia without obstruction or                            gangrene                           R13.10, Dysphagia, unspecified CPT copyright 2019 American Medical Association. All rights reserved. The codes documented in this report are preliminary and upon coder review may  be revised to meet current compliance requirements. [name]. Shilynn Hoch, MD Norvel Richards, MD 09/18/2018 11:18:43 AM This report has been signed electronically. Number of Addenda: 0

## 2018-09-20 ENCOUNTER — Encounter (HOSPITAL_COMMUNITY): Payer: Self-pay | Admitting: Internal Medicine

## 2018-09-26 ENCOUNTER — Telehealth: Payer: Self-pay | Admitting: *Deleted

## 2018-09-26 NOTE — Telephone Encounter (Signed)
Lmom, waiting on a return call.  

## 2018-09-26 NOTE — Telephone Encounter (Signed)
Lmom, waiting on a return call. To discuss abdominal pain.

## 2018-09-26 NOTE — Telephone Encounter (Signed)
Please tell the patient her upper endoscopy found a narrowing that was stretched and "disrupted" to allow passage of food.  She did have a small hiatal hernia and we typically do not do anything about these unless they become large.  Otherwise her exam was normal.  We recommended continuing Dexilant 60 mg once a day, personally in the morning on an empty stomach.  Follow-up in our office in 3 months.  When did she start having constant abdominal pain and abdominal swelling? If she is having worsening constipation this can cause abdominal pain and bloating. She can try adding MiraLAX once to twice a day as needed for constipation.  Call if any further concerns or if this doesn't help.

## 2018-09-26 NOTE — Telephone Encounter (Signed)
Pt wants to discuss results from her recent procedure.  Still having stomach pain.  [phone]

## 2018-09-26 NOTE — Telephone Encounter (Signed)
Pt is inquiring about her results on 09/18/2018 for her EGD results. Pt states when she woke up they said something about a small hernia. Pt has c/o abdominal pain at her upper abdomen. Pt reports swelling and constant pain. Pt is taking Linzess 290 mcg and it was working well. Pt isn't having normal bowel movements every couple days. When pt goes to have a bowel movement, only a little comes out.

## 2018-09-27 ENCOUNTER — Telehealth: Payer: Self-pay | Admitting: Internal Medicine

## 2018-09-27 NOTE — Telephone Encounter (Signed)
Pt was notified of results. Pt states that the constant abdominal pain started 2 days after her procedure. Pt will continue the Dexilant 60 mg daily and f/u with [facility]SL on 12/18/2018. Pt is going to add the Miralax 1-2 times a day to see if it helps the constipation she's been having.

## 2018-09-27 NOTE — Telephone Encounter (Signed)
PATIENT RETURNED CALL, PLEASE CALL BACK  °

## 2018-09-28 NOTE — Telephone Encounter (Signed)
Noted  

## 2018-09-28 NOTE — Telephone Encounter (Signed)
Noted, we'll see if this helps. If not we can make further recommendations or bring her back in sooner.

## 2018-10-09 ENCOUNTER — Other Ambulatory Visit: Payer: Self-pay | Admitting: Family Medicine

## 2018-10-09 ENCOUNTER — Other Ambulatory Visit: Payer: Self-pay | Admitting: Gastroenterology

## 2018-10-23 ENCOUNTER — Other Ambulatory Visit (INDEPENDENT_AMBULATORY_CARE_PROVIDER_SITE_OTHER): Payer: Self-pay | Admitting: Specialist

## 2018-11-06 ENCOUNTER — Other Ambulatory Visit (HOSPITAL_COMMUNITY): Payer: Self-pay | Admitting: Family Medicine

## 2018-11-06 DIAGNOSIS — Z1231 Encounter for screening mammogram for malignant neoplasm of breast: Secondary | ICD-10-CM

## 2018-11-07 ENCOUNTER — Other Ambulatory Visit (HOSPITAL_COMMUNITY): Payer: Self-pay | Admitting: Family Medicine

## 2018-11-07 DIAGNOSIS — Z1231 Encounter for screening mammogram for malignant neoplasm of breast: Secondary | ICD-10-CM

## 2018-11-08 ENCOUNTER — Other Ambulatory Visit: Payer: Self-pay | Admitting: Family Medicine

## 2018-11-09 ENCOUNTER — Ambulatory Visit: Payer: Medicare Other | Admitting: Nurse Practitioner

## 2018-11-10 ENCOUNTER — Ambulatory Visit (HOSPITAL_COMMUNITY)
Admission: RE | Admit: 2018-11-10 | Discharge: 2018-11-10 | Disposition: A | Discharge status: Home / Self Care | Payer: Medicare Other | Source: Ambulatory Visit | Attending: Family Medicine | Admitting: Family Medicine

## 2018-11-10 ENCOUNTER — Other Ambulatory Visit: Payer: Self-pay

## 2018-11-10 DIAGNOSIS — Z1231 Encounter for screening mammogram for malignant neoplasm of breast: Secondary | ICD-10-CM

## 2018-11-17 ENCOUNTER — Other Ambulatory Visit: Payer: Self-pay

## 2018-11-17 DIAGNOSIS — I10 Essential (primary) hypertension: Secondary | ICD-10-CM

## 2018-11-17 MED ORDER — AMLODIPINE BESYLATE 10 MG PO TABS: 10.0000 mg | ORAL_TABLET | Freq: Every day | ORAL | 3 refills | Status: DC

## 2018-11-21 ENCOUNTER — Telehealth: Payer: Self-pay | Admitting: Internal Medicine

## 2018-11-21 NOTE — Telephone Encounter (Signed)
Spoke with pt. Pt was advised to go to the ED if she was in severe pain. Pt was also offered an appointment with EG in the morning 11/22/2018 and declined due to not having a ride. I tried to schedule another apt that might accommodate pt and she declined at this time. Pt's car isn't working currently and she'll call back when she's able to schedule an appointment.

## 2018-11-21 NOTE — Telephone Encounter (Signed)
Pt wants to speak to the nurse regarding her previous procedure. [phone]

## 2018-11-21 NOTE — Telephone Encounter (Signed)
Pt returned call. Pt called with c/o upper and lower abdominal pain, nausea, and watery diarrhea that cleared up after 1 day. Pt has an EGD on 09/18/2018 and states she was having pain before that procedure and it hasn't gotten any been. Pt's pain is at an 8 and is constant. Pt feels that pain worsen after eating and the pain feels like a dull ache. Pt takes Dexilant 60 mg qam and Linzess qam. Pt is having 1-2 bowel movements with Linzess and noticed some diarrhea yesterday. Pt went to the bathroom a total of 4 times yesterday and her stool was watery. Pt's stool was back to normal this morning. Pt doesn't know what could be causing her abdominal pain and states she is tried of the pain. Pleas advise in the absence of EG.

## 2018-11-21 NOTE — Telephone Encounter (Signed)
Lmom, waiting on a return call.  

## 2018-11-21 NOTE — Telephone Encounter (Signed)
Can patient come in tomorrow to be seen? [name] has an opening at 8:30am. If she is in severe pain, she should proceed to the ED for further evaluation. Otherwise, we can try to see her tomorrow to sort out what is going on and come up with the best plan moving forward.

## 2018-12-08 ENCOUNTER — Other Ambulatory Visit: Payer: Self-pay | Admitting: Family Medicine

## 2018-12-08 DIAGNOSIS — M48062 Spinal stenosis, lumbar region with neurogenic claudication: Secondary | ICD-10-CM

## 2018-12-18 ENCOUNTER — Encounter: Payer: Self-pay | Admitting: Internal Medicine

## 2018-12-18 ENCOUNTER — Telehealth: Payer: Self-pay | Admitting: Internal Medicine

## 2018-12-18 ENCOUNTER — Ambulatory Visit: Payer: Medicare Other | Admitting: Gastroenterology

## 2018-12-18 NOTE — Telephone Encounter (Signed)
Patient was a no show and letter sent  °

## 2018-12-26 ENCOUNTER — Other Ambulatory Visit: Payer: Self-pay | Admitting: Family Medicine

## 2018-12-26 ENCOUNTER — Other Ambulatory Visit (INDEPENDENT_AMBULATORY_CARE_PROVIDER_SITE_OTHER): Payer: Self-pay | Admitting: Specialist

## 2018-12-29 ENCOUNTER — Ambulatory Visit (INDEPENDENT_AMBULATORY_CARE_PROVIDER_SITE_OTHER): Payer: Medicare Other | Admitting: Gastroenterology

## 2018-12-29 ENCOUNTER — Other Ambulatory Visit: Payer: Self-pay

## 2018-12-29 ENCOUNTER — Encounter: Payer: Self-pay | Admitting: Gastroenterology

## 2018-12-29 VITALS — BP 150/64 | HR 90 | Temp 97.2°F | Ht 66.0 in | Wt 185.6 lb

## 2018-12-29 DIAGNOSIS — R1010 Upper abdominal pain, unspecified: Secondary | ICD-10-CM

## 2018-12-29 DIAGNOSIS — R197 Diarrhea, unspecified: Secondary | ICD-10-CM | POA: Diagnosis not present

## 2018-12-29 DIAGNOSIS — K219 Gastro-esophageal reflux disease without esophagitis: Secondary | ICD-10-CM

## 2018-12-29 DIAGNOSIS — Z8719 Personal history of other diseases of the digestive system: Secondary | ICD-10-CM | POA: Diagnosis not present

## 2018-12-29 MED ORDER — DICYCLOMINE HCL 10 MG PO CAPS: 10.0000 mg | ORAL_CAPSULE | ORAL | 1 refills | Status: DC | PRN

## 2018-12-29 NOTE — Patient Instructions (Signed)
1. Please go to [facility] to have your labs done. 2. CT scan of your abdomen to evaluate your pain. 3. Trial of bentyl, one capsule up to three times daily for abdominal pain. HOLD FOR CONSTIPATION.

## 2018-12-29 NOTE — Assessment & Plan Note (Signed)
Pleasant 58 year old female with chronic intermittent upper abdominal pain, worse with meals, worse with pressure.  Somewhat downplays her symptoms but then explains that the pain can be significant at times that she just has to lay down.  She does not feel like doing anything.  Some nausea but no vomiting.  Intermittently has some diarrhea in the setting of Linzess use.  Historically with constipation and requires Linzess daily.  History of small bowel obstruction last year as outlined.  Treated conservatively at that time.  Given ongoing abdominal pain, recommend repeat CT imaging of abdomen and pelvis with contrast to make sure no underlying malignancy especially in light of small bowel obstruction previously.  Update labs.  Trial of dicyclomine for abdominal pain, cautioned of side effects including constipation.

## 2018-12-29 NOTE — Progress Notes (Signed)
Primary Care Physician: Guadalupe Dawn, MD  Primary Gastroenterologist:  Garfield Cornea, MD   Chief Complaint  Patient presents with  . Abdominal Pain    some pain that comes/goes  . Diarrhea    x 2 days    HPI: [name] is a 58 y.o. female here for follow-up of abdominal pain.  She was seen back in July for dysphagia.  She also has a history of constipation, elevated alkaline phosphatase.  Typically constipation well controlled on Linzess.  Reflux well controlled on Dexilant.  Colonoscopy 2019 for rectal bleeding, noted hemorrhoids recommended repeat colonoscopy in 10 years.  History of multiple esophageal dilations in the past.  Last EGD August 2020 with Schatzki ring status post dilation/disruption, hiatal hernia.  While inpatient in November 2019 for pneumonia and sepsis she had a small bowel obstruction treated conservatively.  Patient states she is tired of feeling bad.  She has been having abdominal pain for several months, predominantly the entire upper abdomen.  Worse with meals.  Has some pain every day.  Can last for hours.  Feels very sore like she has been punched.  Some crampy quality as well.  Has to lay down because of the pain.  Some nausea but no vomiting.  Denies weight loss.  She has intermittent diarrhea which she relates to her nerves.  No melena or rectal bleeding.  Historically constipated and takes Linzess 290 mcg every day.  Generally produces regular bowel movements.    Current Outpatient Medications  Medication Sig Dispense Refill  . acyclovir (ZOVIRAX) 400 MG tablet Take 1 tablet (400 mg total) by mouth 2 (two) times daily. 180 tablet 3  . albuterol (PROVENTIL HFA;VENTOLIN HFA) 108 (90 Base) MCG/ACT inhaler Inhale 1-2 puffs into the lungs every 4 (four) hours as needed for wheezing or shortness of breath. 1 Inhaler 0  . amitriptyline (ELAVIL) 150 MG tablet TAKE 1 TABLET BY MOUTH ONCE DAILY AT NIGHT 90 tablet 0  . amLODipine (NORVASC) 10 MG tablet  Take 1 tablet (10 mg total) by mouth daily. 90 tablet 3  . atorvastatin (LIPITOR) 40 MG tablet Take 1 tablet (40 mg total) by mouth daily. 90 tablet 3  . budesonide-formoterol (SYMBICORT) 160-4.5 MCG/ACT inhaler Inhale 2 puffs into the lungs 2 (two) times daily. (Patient taking differently: Inhale 2 puffs into the lungs 2 (two) times daily as needed (respiratory issues.). ) 1 Inhaler 3  . celecoxib (CELEBREX) 200 MG capsule Take 1 capsule by mouth once daily 60 capsule 0  . DEXILANT 60 MG capsule TAKE 1 CAPSULE BY MOUTH ONCE DAILY WITH BREAKFAST 90 capsule 3  . DULoxetine (CYMBALTA) 60 MG capsule Take 1 capsule (60 mg total) by mouth 2 (two) times daily. 120 capsule 11  . linaclotide (LINZESS) 290 MCG CAPS capsule Take 1 capsule (290 mcg total) by mouth daily before breakfast. 30 capsule 11  . pregabalin (LYRICA) 100 MG capsule TAKE 1 CAPSULE BY MOUTH THREE TIMES DAILY 90 capsule 0   No current facility-administered medications for this visit.    Allergies as of 12/29/2018 - Review Complete 12/29/2018  Allergen Reaction Noted  . Promethazine hcl Nausea And Vomiting and Other (See Comments) 07/26/2008    ROS:  General: Negative for anorexia, weight loss, fever, chills, fatigue, weakness. ENT: Negative for hoarseness, difficulty swallowing , nasal congestion. CV: Negative for chest pain, angina, palpitations, dyspnea on exertion, peripheral edema.  Respiratory: Negative for dyspnea at rest, dyspnea on exertion, cough, sputum, wheezing.  GI: See history of present illness. GU:  Negative for dysuria, hematuria, urinary incontinence, urinary frequency, nocturnal urination.  Endo: Negative for unusual weight change.    Physical Examination:   BP (!) 150/64   Pulse 90   Temp (!) 97.2 F (36.2 C) (Oral)   Ht 5\' 6"  (1.676 m)   Wt 185 lb 9.6 oz (84.2 kg)   BMI 29.96 kg/m   General: Well-nourished, well-developed in no acute distress.  Eyes: No icterus. Mouth: Oropharyngeal mucosa moist  and pink , no lesions erythema or exudate. Lungs: Clear to auscultation bilaterally.  Heart: Regular rate and rhythm, no murmurs rubs or gallops.  Abdomen: Bowel sounds are normal, moderate tenderness throughout the entire upper abdomen, slight distention, no hepatosplenomegaly or masses, no abdominal bruits or hernia , no rebound or guarding.   Extremities: No lower extremity edema. No clubbing or deformities. Neuro: Alert and oriented x 4   Skin: Warm and dry, no jaundice.   Psych: Alert and cooperative, normal mood and affect.  Labs:  Lab Results  Component Value Date   ALT 11 08/14/2018   AST 13 08/14/2018   ALKPHOS 159 08/14/2018   BILITOT 0.3 08/14/2018   GGT 35.  Imaging Studies: No results found.

## 2019-01-06 LAB — CBC WITH DIFFERENTIAL/PLATELET
Absolute Monocytes: 291 cells/uL (ref 200–950)
Basophils Absolute: 42 cells/uL (ref 0–200)
Basophils Relative: 0.9 %
Eosinophils Absolute: 179 cells/uL (ref 15–500)
Eosinophils Relative: 3.8 %
HCT: 38.6 % (ref 35.0–45.0)
Hemoglobin: 13.2 g/dL (ref 11.7–15.5)
Lymphs Abs: 1880 cells/uL (ref 850–3900)
MCH: 30.5 pg (ref 27.0–33.0)
MCHC: 34.2 g/dL (ref 32.0–36.0)
MCV: 89.1 fL (ref 80.0–100.0)
MPV: 9.8 fL (ref 7.5–12.5)
Monocytes Relative: 6.2 %
Neutro Abs: 2308 cells/uL (ref 1500–7800)
Neutrophils Relative %: 49.1 %
Platelets: 299 10*3/uL (ref 140–400)
RBC: 4.33 10*6/uL (ref 3.80–5.10)
RDW: 12.8 % (ref 11.0–15.0)
Total Lymphocyte: 40 %
WBC: 4.7 10*3/uL (ref 3.8–10.8)

## 2019-01-06 LAB — COMPREHENSIVE METABOLIC PANEL
AG Ratio: 1.4 (calc) (ref 1.0–2.5)
ALT: 12 U/L (ref 6–29)
AST: 10 U/L (ref 10–35)
Albumin: 4.3 g/dL (ref 3.6–5.1)
Alkaline phosphatase (APISO): 155 U/L — ABNORMAL HIGH (ref 37–153)
BUN: 8 mg/dL (ref 7–25)
CO2: 29 mmol/L (ref 20–32)
Calcium: 9.2 mg/dL (ref 8.6–10.4)
Chloride: 104 mmol/L (ref 98–110)
Creat: 0.89 mg/dL (ref 0.50–1.05)
Globulin: 3.1 g/dL (calc) (ref 1.9–3.7)
Glucose, Bld: 99 mg/dL (ref 65–99)
Potassium: 3.7 mmol/L (ref 3.5–5.3)
Sodium: 141 mmol/L (ref 135–146)
Total Bilirubin: 0.4 mg/dL (ref 0.2–1.2)
Total Protein: 7.4 g/dL (ref 6.1–8.1)

## 2019-01-06 LAB — LIPASE: Lipase: 12 U/L (ref 7–60)

## 2019-01-15 ENCOUNTER — Encounter: Payer: Self-pay | Admitting: General Practice

## 2019-01-18 ENCOUNTER — Ambulatory Visit (HOSPITAL_COMMUNITY): Admission: RE | Admit: 2019-01-18 | Payer: Medicare Other | Source: Ambulatory Visit

## 2019-01-23 ENCOUNTER — Ambulatory Visit (HOSPITAL_COMMUNITY)
Admission: RE | Admit: 2019-01-23 | Discharge: 2019-01-23 | Disposition: A | Discharge status: Home / Self Care | Payer: Medicare Other | Source: Ambulatory Visit | Attending: Gastroenterology | Admitting: Gastroenterology

## 2019-01-23 ENCOUNTER — Other Ambulatory Visit: Payer: Self-pay

## 2019-01-23 DIAGNOSIS — R1010 Upper abdominal pain, unspecified: Secondary | ICD-10-CM | POA: Insufficient documentation

## 2019-01-23 MED ORDER — IOHEXOL 300 MG/ML  SOLN
100.0000 mL | Freq: Once | INTRAMUSCULAR | Status: AC | PRN
  Administered 2019-01-23: 100 mL via INTRAVENOUS

## 2019-01-26 ENCOUNTER — Ambulatory Visit: Payer: Medicare Other | Admitting: Family Medicine

## 2019-01-29 ENCOUNTER — Telehealth (INDEPENDENT_AMBULATORY_CARE_PROVIDER_SITE_OTHER): Payer: Medicare Other | Admitting: Family Medicine

## 2019-01-29 ENCOUNTER — Ambulatory Visit: Payer: Medicare Other | Attending: Internal Medicine

## 2019-01-29 ENCOUNTER — Encounter: Payer: Self-pay | Admitting: Family Medicine

## 2019-01-29 ENCOUNTER — Other Ambulatory Visit: Payer: Self-pay

## 2019-01-29 DIAGNOSIS — M7711 Lateral epicondylitis, right elbow: Secondary | ICD-10-CM

## 2019-01-29 DIAGNOSIS — Z20822 Contact with and (suspected) exposure to covid-19: Secondary | ICD-10-CM

## 2019-01-29 MED ORDER — IBUPROFEN 600 MG PO TABS: 600.0000 mg | ORAL_TABLET | Freq: Three times a day (TID) | ORAL | 0 refills | Status: DC | PRN

## 2019-01-30 LAB — NOVEL CORONAVIRUS, NAA: SARS-CoV-2, NAA: NOT DETECTED

## 2019-02-06 DIAGNOSIS — G5630 Lesion of radial nerve, unspecified upper limb: Secondary | ICD-10-CM | POA: Insufficient documentation

## 2019-02-06 NOTE — Progress Notes (Signed)
[name] Telemedicine Visit  Patient consented to have virtual visit. Method of visit: Video  Encounter participants: Patient: [name] - located at home Provider: [facility] at [facility]  Chief Complaint: Elbow pain  HPI: 59 year old female who presents for right elbow pain.  She says the pain for started roughly 3 to 4 weeks ago but has progressively gotten worse.  Pain is present at the trochlear notch.  She states that she does rapidly rotate her elbow with both work and in her day-to-day activity.  Pain is generally worse at night.  Pain is exacerbated when she tries to extend her palm and fingers.  Does have pain when shaking hands.   No other symptoms such as tingling or numbness in the fingers.  ROS: per HPI  Pertinent PMHx: None  Exam:  General: Well-appearing 59 year old Afro-American female, no acute distress Respiratory: No accessory muscle use, no distress Psych: Very pleasant, good thought process Right elbow: Tenderness to palpation over trochlear notch, notable pain when fully extends fingers and wrist.  Assessment/Plan:  Lateral epicondylitis Findings consistent with lateral epicondylitis.  Recommended picking up elbow brace, gave link to various elbow stretching exercises.  Can take ibuprofen 600 mg every 8 hours.  Recommended follow-up if symptoms do not improve.    Time spent during visit with patient: 9 minutes

## 2019-02-06 NOTE — Assessment & Plan Note (Signed)
Findings consistent with lateral epicondylitis.  Recommended picking up elbow brace, gave link to various elbow stretching exercises.  Can take ibuprofen 600 mg every 8 hours.  Recommended follow-up if symptoms do not improve.

## 2019-02-08 ENCOUNTER — Other Ambulatory Visit: Payer: Self-pay | Admitting: Family Medicine

## 2019-02-16 ENCOUNTER — Encounter: Payer: Self-pay | Admitting: Family Medicine

## 2019-02-16 ENCOUNTER — Other Ambulatory Visit: Payer: Self-pay

## 2019-02-16 ENCOUNTER — Ambulatory Visit (INDEPENDENT_AMBULATORY_CARE_PROVIDER_SITE_OTHER): Payer: Medicare Other | Admitting: Family Medicine

## 2019-02-16 DIAGNOSIS — G5630 Lesion of radial nerve, unspecified upper limb: Secondary | ICD-10-CM

## 2019-02-16 DIAGNOSIS — R748 Abnormal levels of other serum enzymes: Secondary | ICD-10-CM

## 2019-02-16 NOTE — Patient Instructions (Signed)
It was great seeing you again today! After being able to perform an exam I actually think that you have something called radial tunnel syndrome. This mimics tennis elbow quite well but unfortunately sometimes the treatment for tennis elbow can make this condition worse. Treatment for this is a stretching program which I gave you, applying ice and heat which should help with the pain and decrease the inflammation. On top of that we do anti-inflammatory medication. I think the best fit is going to be naproxen which is an over-the-counter medication. I gave you a handout for this. I would eat your food and then about 10 or 15 minutes later take the naproxen. You can take it 2 times per day for a week and then as needed. I would like for you to let me know how things are going near the end of next week.  Your blood type is O+. I am not concerned about the alkaline phosphatase as it has been trending down over the last several checks and is only just slightly above the normal range.  Lastly I will work on getting you an albuterol nebulizer sent to your house. I will send in the albuterol medication to the pharmacy, but you will be able to use it until the nebulizer gets to your house. I feel all of the paperwork I need to and will hopefully get it to you sometime next week.

## 2019-02-19 ENCOUNTER — Encounter: Payer: Self-pay | Admitting: Family Medicine

## 2019-02-19 NOTE — Assessment & Plan Note (Signed)
Upon further examination the patient symptoms fit more closely with radial tunnel syndrome.  This explained the lack of improvement with the brace.  Ultimately NSAIDs and specific stretching exercises will be helpful for her.  She is willing to try naproxen as she does not think that she has had any allergies to this.  Also gave patient a number of extensor compartment stretches which should help alleviate this compressive neuropathy. -Naproxen 500 mg twice daily for 7 days, then as needed -Recommended stopping the Celebrex if taking the naproxen -Gave extensor compartment stretches -Follow-up as needed

## 2019-02-19 NOTE — Assessment & Plan Note (Addendum)
Very very minimally elevated alkaline phosphatase.  Has had normal GGT in the last few months.  Reassuring given that it is so close to the normal range and has been downtrending for multiple years on subsequent checks, will watch for now and can consider further work-up if needed in the future.  Could also be related to amitriptyline use as TCAs have a known side effect profile which includes alteration of alkaline phosphatase levels. -Likely secondary to amitriptyline -Reassuring giving downtrending, and very close to normal range -Can consider recheck in 6 months.

## 2019-02-19 NOTE — Progress Notes (Signed)
   CHIEF COMPLAINT / HPI: 59 year old female who presents for right elbow pain.  Patient well-known to me as I saw her as a telemedicine visit on 01/29/2019.  Patient had lateral elbow pain which was felt to be consistent with tennis elbow.  In the meantime the patient was unable to tolerate ibuprofen.  Stated that it gave her an upset stomach and constipated her.  She also feels like the elbow brace made her symptoms worse.  The patient now describes pain with any sort of use of her extensor muscles, any form of supination.  States it hurts worse when she extends her middle finger.  She describes the pain as a burning sensation that affects the majority of her extensor compartment.  She is also following up at the discretion of her GI doctor due to persistently elevated alkaline phosphatase.  Has had a normal GGT in the past.  Upon chart review she has a very very mild elevation in her alk phos and this has been persistently trending downwards for the last few years.  Was 155 at last check, 159 at check before that.   PERTINENT  PMH / PSH:    OBJECTIVE: BP 130/80   Pulse 92   Wt 185 lb 12.8 oz (84.3 kg)   SpO2 97%   BMI 29.99 kg/m   Gen: Very pleasant 59 year old African-American female, no acute distress CV: Skin warm and dry Resp: No accessory muscle use Neuro: Alert and oriented, Speech clear, No gross deficits  Right arm: Tenderness over lateral epicondyles.  Tenderness to palpation over extensor compartment muscles.  Apparent burning, "electric sensation" with deep palpation over extensor compartment.  Very tender to supination and extension of forearm.  ASSESSMENT / PLAN:  Elevated alkaline phosphatase level Very very minimally elevated alkaline phosphatase.  Has had normal GGT in the last few months.  Reassuring given that it is so close to the normal range and has been downtrending for multiple years on subsequent checks, will watch for now and can consider further work-up if needed  in the future.  Could also be related to amitriptyline use as TCAs have a known side effect profile which includes alteration of alkaline phosphatase levels. -Likely secondary to amitriptyline -Reassuring giving downtrending, and very close to normal range -Can consider recheck in 6 months.  Radial tunnel syndrome Upon further examination the patient symptoms fit more closely with radial tunnel syndrome.  This explained the lack of improvement with the brace.  Ultimately NSAIDs and specific stretching exercises will be helpful for her.  She is willing to try naproxen as she does not think that she has had any allergies to this.  Also gave patient a number of extensor compartment stretches which should help alleviate this compressive neuropathy. -Naproxen 500 mg twice daily for 7 days, then as needed -Recommended stopping the Celebrex if taking the naproxen -Gave extensor compartment stretches -Follow-up as needed     Guadalupe Dawn MD PGY-3 Family Medicine Resident [place]

## 2019-02-20 ENCOUNTER — Other Ambulatory Visit (INDEPENDENT_AMBULATORY_CARE_PROVIDER_SITE_OTHER): Payer: Self-pay | Admitting: Specialist

## 2019-02-20 ENCOUNTER — Other Ambulatory Visit: Payer: Self-pay | Admitting: Gastroenterology

## 2019-02-22 ENCOUNTER — Telehealth: Payer: Self-pay | Admitting: *Deleted

## 2019-02-22 DIAGNOSIS — G5630 Lesion of radial nerve, unspecified upper limb: Secondary | ICD-10-CM

## 2019-02-22 DIAGNOSIS — J441 Chronic obstructive pulmonary disease with (acute) exacerbation: Secondary | ICD-10-CM

## 2019-02-22 NOTE — Telephone Encounter (Signed)
Patient calls for 2 reasons:  1. Her arm is still hurting and she is requesting an xray  2. She has yet to receive her nebulizer.  Will forward to PCP because I am unsure of the type of paperwork he mentions in [employer], [place]

## 2019-02-23 NOTE — Telephone Encounter (Signed)
Pt calls again to check on status.   Talbot Grumbling, RN

## 2019-02-26 NOTE — Telephone Encounter (Signed)
Spoke with pt and told her she can go to GI to get the x-ray. I also explained that the nebulizer machine has been requested and she should hear something from that in the next couple of days. Ottis Stain, CMA

## 2019-02-26 NOTE — Telephone Encounter (Signed)
I have placed xrays to look at the patient's right elbow. She can walk in at any time to [facility] to get these done. I have also placed a sports medicine referral. I will give her a call this afternoon to discuss these steps.  I have placed an order for dme nebulizer machine for her albuterol.  Guadalupe Dawn MD PGY-3 Family Medicine Resident

## 2019-02-26 NOTE — Telephone Encounter (Signed)
Pt calling again to check on status of nebulizer and X-Ray.   Paged PCP.  Talbot Grumbling, RN

## 2019-02-28 ENCOUNTER — Other Ambulatory Visit: Payer: Self-pay

## 2019-02-28 ENCOUNTER — Telehealth (INDEPENDENT_AMBULATORY_CARE_PROVIDER_SITE_OTHER): Payer: Medicare Other | Admitting: Family Medicine

## 2019-02-28 DIAGNOSIS — J441 Chronic obstructive pulmonary disease with (acute) exacerbation: Secondary | ICD-10-CM | POA: Diagnosis not present

## 2019-02-28 NOTE — Progress Notes (Signed)
[place] Telemedicine Visit  Patient consented to have virtual visit. Method of visit: Telephone  Encounter participants: Patient: [name] - located at home Provider: [facility] - [facility] at [facility]  Chief Complaint: albuterol nebulzier  HPI: 59 year old female who presents as a telemedicine visit to discuss albuterol nebulizer. Order previously placed for the nebulizer, patient states that she needs a doctor's approval to get nebulizer per supply company.  ROS: per HPI  Pertinent PMHx: copd  Exam:  General: no distress, very pleasant Resp: no wheezing or distress  Assessment/Plan:  COPD Will talk to advanced home care/ medicare to prove medical necessity for albuterol nebulizer. Patient states that she has tried inhaler in the past and has not gotten good results and feels like she cant use them.   Time spent during visit with patient: 6 minutes

## 2019-02-28 NOTE — Telephone Encounter (Signed)
See below message from [place] regarding providing nebulizer:  She needs to have a face to face or televisit discussing the need for the nebulizer in the last 6 months. I went back and I do not see any notes addressing the need for the nebulizer. Please advise once she has the televisit or face to face visit         Scheduled virtual visit this AM with Dr. Danton Sewer, RN

## 2019-03-01 ENCOUNTER — Ambulatory Visit (HOSPITAL_COMMUNITY)
Admission: RE | Admit: 2019-03-01 | Discharge: 2019-03-01 | Disposition: A | Discharge status: Home / Self Care | Payer: Medicare Other | Source: Ambulatory Visit | Attending: Family Medicine | Admitting: Family Medicine

## 2019-03-01 ENCOUNTER — Other Ambulatory Visit: Payer: Self-pay

## 2019-03-01 DIAGNOSIS — Z1231 Encounter for screening mammogram for malignant neoplasm of breast: Secondary | ICD-10-CM

## 2019-03-01 DIAGNOSIS — G5630 Lesion of radial nerve, unspecified upper limb: Secondary | ICD-10-CM | POA: Diagnosis not present

## 2019-03-01 DIAGNOSIS — M79601 Pain in right arm: Secondary | ICD-10-CM | POA: Diagnosis not present

## 2019-03-01 DIAGNOSIS — M79631 Pain in right forearm: Secondary | ICD-10-CM | POA: Diagnosis not present

## 2019-03-01 NOTE — Telephone Encounter (Signed)
Patient calling nurse line requesting X-Ray results from today.   To PCP  Talbot Grumbling, RN

## 2019-03-02 ENCOUNTER — Telehealth: Payer: Self-pay | Admitting: Family Medicine

## 2019-03-02 NOTE — Telephone Encounter (Signed)
Pt is calling and would like for Dr. Kris Mouton to call her. She said she thought he was calling yesterday. She needs to discuss her results from her Xray and about her nebulizer.   Please call pt to discuss the following.  The best call back number is [phone]

## 2019-03-05 NOTE — Progress Notes (Signed)
Visit addendum  The patient has a demonstrated need for a nebulizer machine as she has frequent wheezing from copd. Albuterol inhalers have not been useful for her in the past. The only route of administration of the albuterol which gets good results is the nebulizer.  Guadalupe Dawn MD PGY-3 Family Medicine Resident

## 2019-03-05 NOTE — Telephone Encounter (Signed)
Pt informed but stated that she wont be going to sports medicine because she doesn't have a $200 deductible. Jamarious Febo Kennon Holter, CMA

## 2019-03-05 NOTE — Telephone Encounter (Signed)
Please let the patient know that her xray results came back negative for findings that would explain her elbow pain. I would encourage her to follow up with sports medicine as scheduled. I have documented the need for the albuterol nebulizer in our most recent visit. If she has not heard back in regards to the nebulizer, she can let me know and I will look into it but I believe I have done what I needed to to get her this.  Guadalupe Dawn MD PGY-3 Family Medicine Resident

## 2019-03-05 NOTE — Telephone Encounter (Signed)
Pt calling again for test results and nebulizer. Please call her back ASAP. Amador Cunas

## 2019-03-05 NOTE — Telephone Encounter (Signed)
Please see other telephone encounter for more detail.  Essentially the xrays were negative, encourage follow up at sports medicine clinic on 2/17. I have documented what was needed to get her the nebulizer. Has been routed to red team.  Guadalupe Dawn MD PGY-3 Family Medicine Resident

## 2019-03-06 ENCOUNTER — Other Ambulatory Visit: Payer: Self-pay | Admitting: Family Medicine

## 2019-03-06 DIAGNOSIS — M48062 Spinal stenosis, lumbar region with neurogenic claudication: Secondary | ICD-10-CM

## 2019-03-07 ENCOUNTER — Telehealth: Payer: Self-pay

## 2019-03-07 ENCOUNTER — Ambulatory Visit: Payer: Medicare Other | Admitting: Sports Medicine

## 2019-03-07 NOTE — Telephone Encounter (Signed)
Patient calls nurse line requesting albuterol nebulizer solution for nebulizer. Patient just picked up nebulizer today and now needs solution. Medication not on current medication list.   To PCP  Talbot Grumbling, RN

## 2019-03-09 MED ORDER — ALBUTEROL SULFATE (2.5 MG/3ML) 0.083% IN NEBU: 2.5000 mg | INHALATION_SOLUTION | Freq: Four times a day (QID) | RESPIRATORY_TRACT | 0 refills | Status: DC | PRN

## 2019-03-09 NOTE — Telephone Encounter (Signed)
I sent in a prescription for the albuterol nebulizer solution to the pharmacy.  Guadalupe Dawn MD PGY-3 Family Medicine Resident

## 2019-03-09 NOTE — Telephone Encounter (Signed)
Patient calling again about this nebulizer solution. She's not sure if she's supposed to get this through a pharmacy or not. She uses [facility] in [place].

## 2019-03-19 ENCOUNTER — Other Ambulatory Visit: Payer: Self-pay | Admitting: Family Medicine

## 2019-03-19 DIAGNOSIS — E7800 Pure hypercholesterolemia, unspecified: Secondary | ICD-10-CM

## 2019-03-25 ENCOUNTER — Other Ambulatory Visit: Payer: Self-pay | Admitting: Family Medicine

## 2019-04-11 ENCOUNTER — Other Ambulatory Visit: Payer: Self-pay | Admitting: Family Medicine

## 2019-04-12 ENCOUNTER — Other Ambulatory Visit: Payer: Self-pay | Admitting: *Deleted

## 2019-04-12 MED ORDER — ALBUTEROL SULFATE (2.5 MG/3ML) 0.083% IN NEBU: INHALATION_SOLUTION | RESPIRATORY_TRACT | 0 refills | Status: DC

## 2019-04-22 ENCOUNTER — Other Ambulatory Visit (INDEPENDENT_AMBULATORY_CARE_PROVIDER_SITE_OTHER): Payer: Self-pay | Admitting: Specialist

## 2019-04-26 ENCOUNTER — Telehealth (INDEPENDENT_AMBULATORY_CARE_PROVIDER_SITE_OTHER): Payer: Medicare Other | Admitting: Family Medicine

## 2019-04-26 ENCOUNTER — Other Ambulatory Visit: Payer: Self-pay

## 2019-04-26 DIAGNOSIS — R300 Dysuria: Secondary | ICD-10-CM | POA: Diagnosis not present

## 2019-04-26 MED ORDER — CEPHALEXIN 500 MG PO CAPS: 500.0000 mg | ORAL_CAPSULE | Freq: Four times a day (QID) | ORAL | 0 refills | Status: AC

## 2019-04-26 NOTE — Progress Notes (Signed)
[place] Telemedicine Visit  Patient consented to have virtual visit. Method of visit: Video was attempted, but technology challenges prevented patient from using video, so visit was conducted via telephone.  Encounter participants: Patient: [name] - located at home Provider: Rory Percy - located at [facility] Others (if applicable): n/a  Chief Complaint: pain with urination  HPI:  DYSURIA  Pain urinating started 3 days ago.  States symptoms are consistent with prior urinary tract infections. Pain is: burning Medications tried: none Any antibiotics in the last 30 days: no More than 3 UTIs in the last 12 months: no STD exposure: no, not sexually active Possibly pregnant: no, perimenopausal  Symptoms Urgency: yes Frequency: yes Blood in urine: no Pain in back: no Fever: no Vaginal discharge: no Mouth Ulcers: no  ROS: per HPI  Pertinent PMHx: HTN, COPD, h/o lung cancer, GERD, h/o SBO, OA, HSV2, obesity, prediabetes  Exam:  Respiratory: Simple sentences, no respiratory distress  Assessment/Plan:  Dysuria Consistent with UTI.  Will provide Keflex x5 days.  Strict return precautions discussed including no relief with antibiotic, worsened or changed symptoms. Patient verbalized understanding.     Time spent during visit with patient: 8 minutes

## 2019-04-26 NOTE — Assessment & Plan Note (Addendum)
Consistent with UTI.  Will provide Keflex x5 days.  Strict return precautions discussed including no relief with antibiotic, worsened or changed symptoms. Patient verbalized understanding.

## 2019-04-27 ENCOUNTER — Other Ambulatory Visit: Payer: Self-pay | Admitting: Family Medicine

## 2019-05-08 ENCOUNTER — Other Ambulatory Visit: Payer: Self-pay | Admitting: Family Medicine

## 2019-05-10 NOTE — Telephone Encounter (Signed)
Covering for Dr. Kris Mouton.  PDMP reviewed and appropriate.

## 2019-05-30 ENCOUNTER — Other Ambulatory Visit: Payer: Self-pay | Admitting: Family Medicine

## 2019-05-30 DIAGNOSIS — M48062 Spinal stenosis, lumbar region with neurogenic claudication: Secondary | ICD-10-CM

## 2019-06-11 ENCOUNTER — Other Ambulatory Visit: Payer: Self-pay | Admitting: Family Medicine

## 2019-06-11 DIAGNOSIS — E7800 Pure hypercholesterolemia, unspecified: Secondary | ICD-10-CM

## 2019-06-19 ENCOUNTER — Telehealth: Payer: Self-pay | Admitting: Internal Medicine

## 2019-06-19 NOTE — Telephone Encounter (Signed)
Called [facility] to see what was going on.  Was informed that pt should have enough pills (Dexilant)  until 06/30/19.  They said her insurance will not cover any more until then.  Called pt back and confirmed that she was only taking one pill ( Dexilant) a day.  She said that she is going to run out because she only has one pill left.  She was made aware that her insurance won't cover until 06/30/19.  She says that she is taking Linzess as well and wants to know if she will be ok until 06/30/19 taking that alone without the [place].

## 2019-06-19 NOTE — Telephone Encounter (Signed)
Pt said she has one Dexilant left and [facility] in [place] told her they couldn't fill her prescription until 06/30/2019. Please advise. [phone]

## 2019-06-19 NOTE — Telephone Encounter (Addendum)
Sorry we don't have any samples to help her out with. I would suggest she take an over the counter alternative until she is able to get her Dexilant filled again.   She could use any of the following: Lansoprazole 15mg  once to twice daily. Omeprazole 20mg  once to twice daily. esomeprazole 20mg  once to twice daily.

## 2019-06-19 NOTE — Telephone Encounter (Signed)
Called pt and informed her that we were sorry that we didn't have any samples right now to give her.  She was advised that she could use one of the following: Lansoprazole 15 mg once to twice daily, Omeprazole 20 mg once to twice daily, or Esomeprazole 20 mg once to twice daily.  Pt voiced understanding.

## 2019-06-22 ENCOUNTER — Other Ambulatory Visit: Payer: Self-pay | Admitting: Family Medicine

## 2019-06-25 ENCOUNTER — Other Ambulatory Visit (INDEPENDENT_AMBULATORY_CARE_PROVIDER_SITE_OTHER): Payer: Self-pay | Admitting: Specialist

## 2019-06-25 ENCOUNTER — Other Ambulatory Visit: Payer: Self-pay | Admitting: Family Medicine

## 2019-07-19 ENCOUNTER — Other Ambulatory Visit: Payer: Self-pay | Admitting: Family Medicine

## 2019-07-19 ENCOUNTER — Telehealth: Payer: Self-pay | Admitting: Family Medicine

## 2019-07-19 DIAGNOSIS — A609 Anogenital herpesviral infection, unspecified: Secondary | ICD-10-CM

## 2019-07-19 NOTE — Telephone Encounter (Signed)
Problem with mouth on both sides not getting well. Please call pt [phone]

## 2019-07-26 NOTE — Telephone Encounter (Signed)
Second request from pharmacy regarding refill. Please advise.

## 2019-07-27 NOTE — Telephone Encounter (Signed)
I called patient to discuss mouth problems. Patient reports she has a current outbreak of cold sores. Patient has been trying for a week to get a refill on acyclovir (separate encounter.) Patient advised to use Abreva for relief until medication can be called in. I apologized for delay, explained Kris Mouton graduated recently and she now has a new provider.

## 2019-07-28 ENCOUNTER — Other Ambulatory Visit: Payer: Self-pay | Admitting: Family Medicine

## 2019-08-20 ENCOUNTER — Other Ambulatory Visit: Payer: Self-pay

## 2019-08-20 DIAGNOSIS — E7800 Pure hypercholesterolemia, unspecified: Secondary | ICD-10-CM

## 2019-08-20 DIAGNOSIS — M48062 Spinal stenosis, lumbar region with neurogenic claudication: Secondary | ICD-10-CM

## 2019-08-20 NOTE — Telephone Encounter (Signed)
Patient calls nurse line stating she needs pended prescriptions called into pharmacy with new PCP name attach ted to them. Please advise.

## 2019-08-21 ENCOUNTER — Other Ambulatory Visit: Payer: Self-pay | Admitting: Family Medicine

## 2019-08-22 NOTE — Telephone Encounter (Signed)
Pt is out of Albuterol neb and pregabalin for 2 weeks ( pharmacy was sending to [facility]).  She is requesting refill on these, if not all.  PCP paged, no response. Christen Bame, CMA

## 2019-08-23 ENCOUNTER — Other Ambulatory Visit: Payer: Self-pay

## 2019-08-23 ENCOUNTER — Other Ambulatory Visit: Payer: Self-pay | Admitting: Family Medicine

## 2019-08-23 MED ORDER — CELECOXIB 200 MG PO CAPS: 200.0000 mg | ORAL_CAPSULE | Freq: Every day | ORAL | 0 refills | Status: DC

## 2019-08-23 MED ORDER — AMITRIPTYLINE HCL 150 MG PO TABS: ORAL_TABLET | ORAL | 0 refills | Status: DC

## 2019-08-23 MED ORDER — ATORVASTATIN CALCIUM 40 MG PO TABS: 40.0000 mg | ORAL_TABLET | Freq: Every day | ORAL | 0 refills | Status: DC

## 2019-08-23 MED ORDER — ALBUTEROL SULFATE (2.5 MG/3ML) 0.083% IN NEBU: INHALATION_SOLUTION | RESPIRATORY_TRACT | 0 refills | Status: DC

## 2019-08-23 MED ORDER — PREGABALIN 100 MG PO CAPS: 100.0000 mg | ORAL_CAPSULE | Freq: Three times a day (TID) | ORAL | 0 refills | Status: DC

## 2019-08-23 MED ORDER — DULOXETINE HCL 60 MG PO CPEP: 60.0000 mg | ORAL_CAPSULE | Freq: Two times a day (BID) | ORAL | 0 refills | Status: DC

## 2019-08-23 MED ORDER — LINACLOTIDE 290 MCG PO CAPS: ORAL_CAPSULE | ORAL | 11 refills | Status: DC

## 2019-08-23 NOTE — Telephone Encounter (Signed)
Already filled cymbalta and lyrica

## 2019-08-23 NOTE — Telephone Encounter (Signed)
Patient calling again to check the status of refills.

## 2019-09-19 ENCOUNTER — Ambulatory Visit: Payer: Medicare Other | Admitting: Family Medicine

## 2019-09-27 ENCOUNTER — Ambulatory Visit: Payer: Medicare Other | Admitting: Family Medicine

## 2019-10-02 IMAGING — US US ABDOMEN LIMITED
1 series · 14 of 25 positions shown · non-contrast
Comparison: CT abdomen and pelvis 12/10/2017

CLINICAL DATA: Hyperbilirubinemia, history GERD, hypertension,
hyperlipidemia, former smoker

EXAM:
ULTRASOUND ABDOMEN LIMITED RIGHT UPPER QUADRANT

[Series 1: us abdomen limited · 14 of 43 slices shown]
[im 1/43]
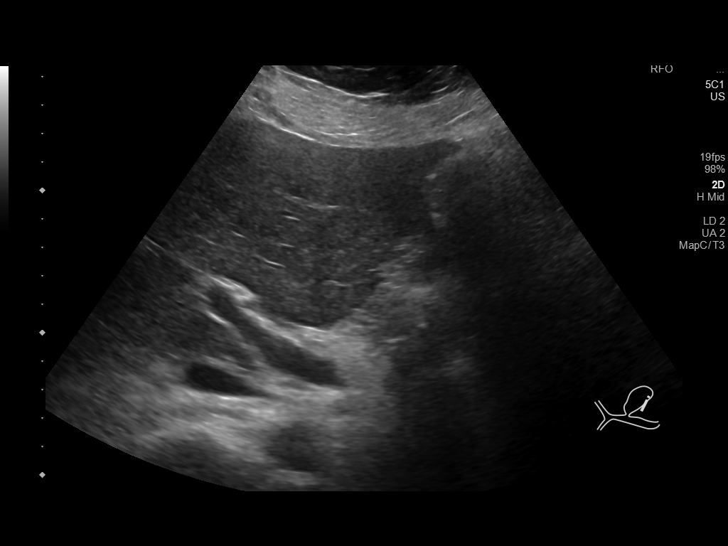
[im 4/43]
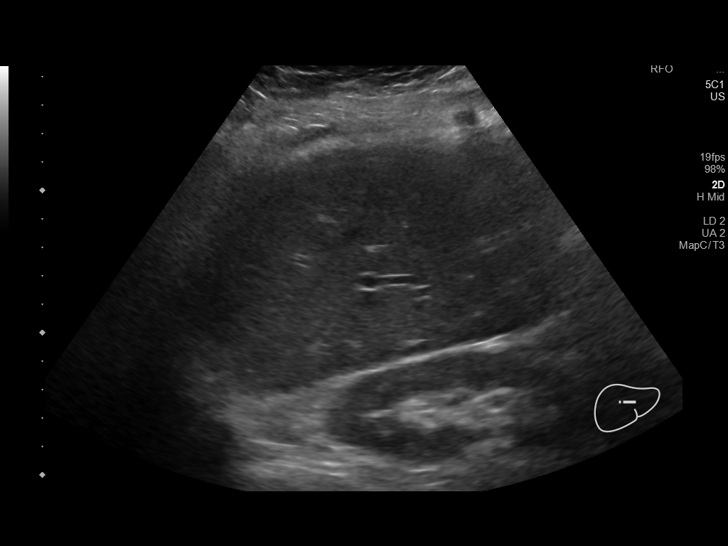
[im 8/43]
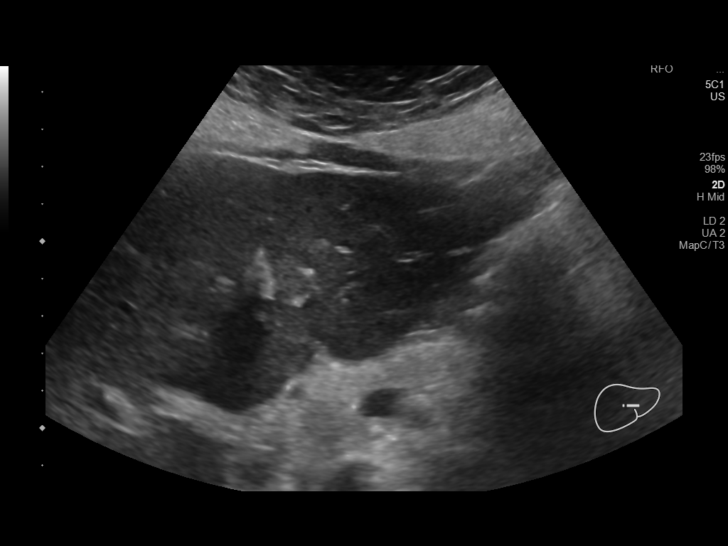
[im 11/43]
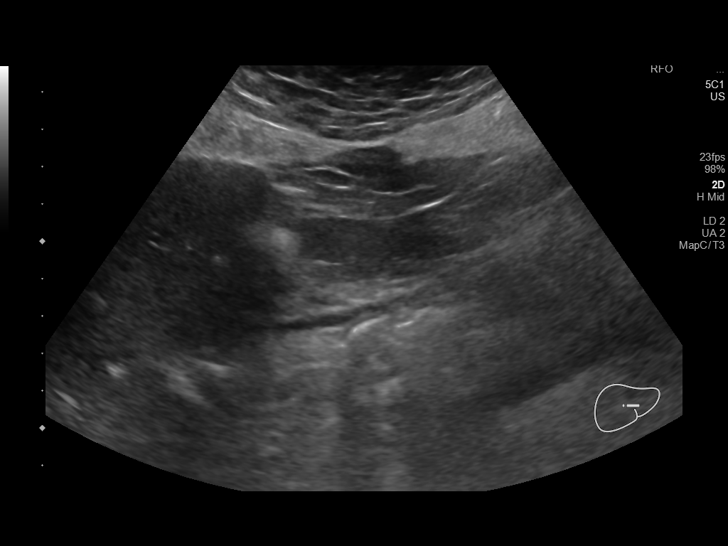
[im 15/43]
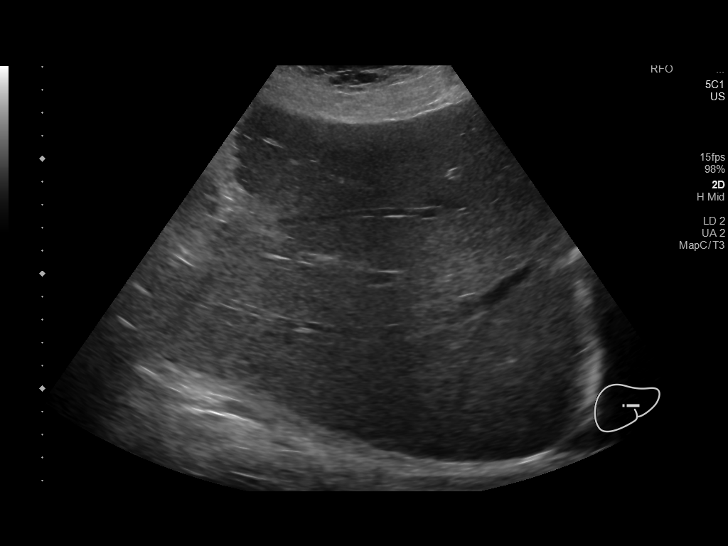
[im 16/43]
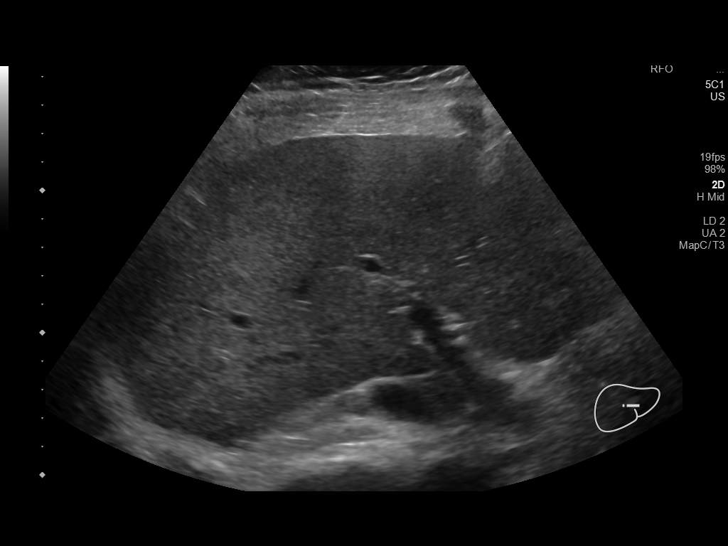
[im 20/43]
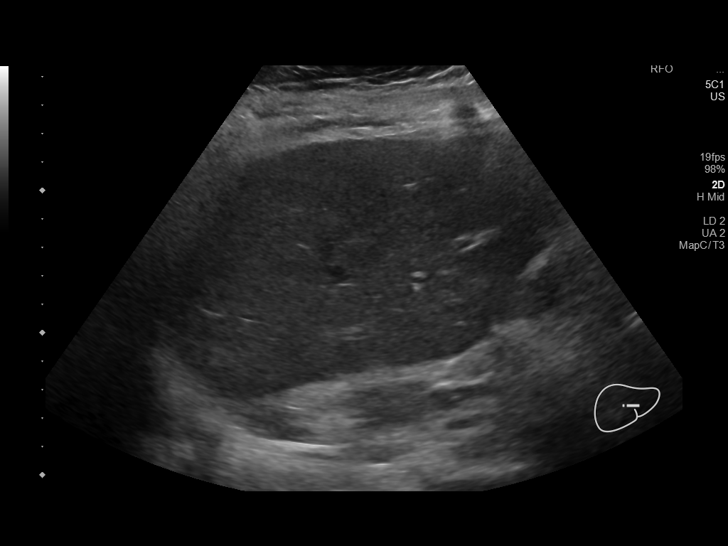
[im 23/43]
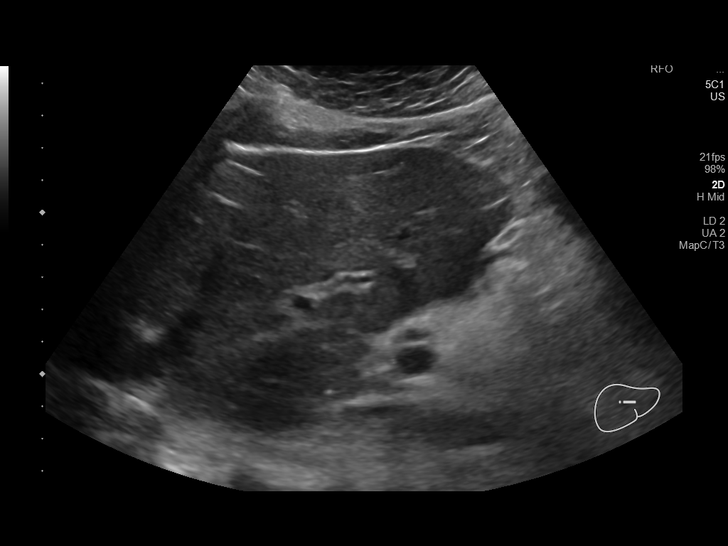
[im 27/43]
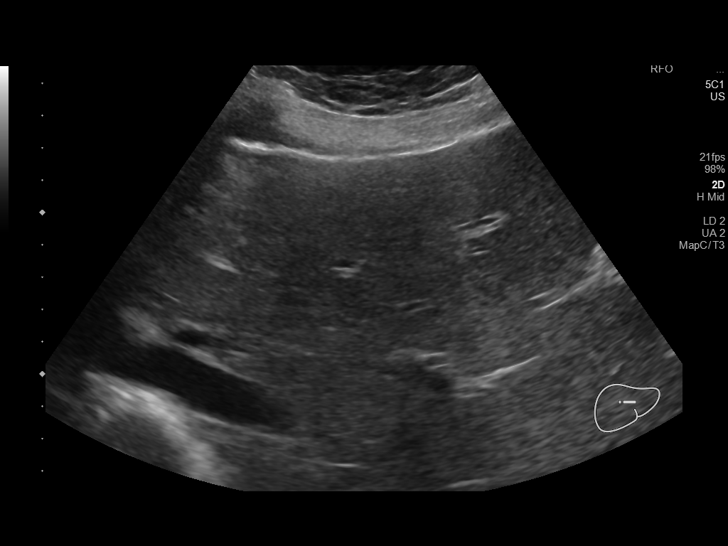
[im 29/43]
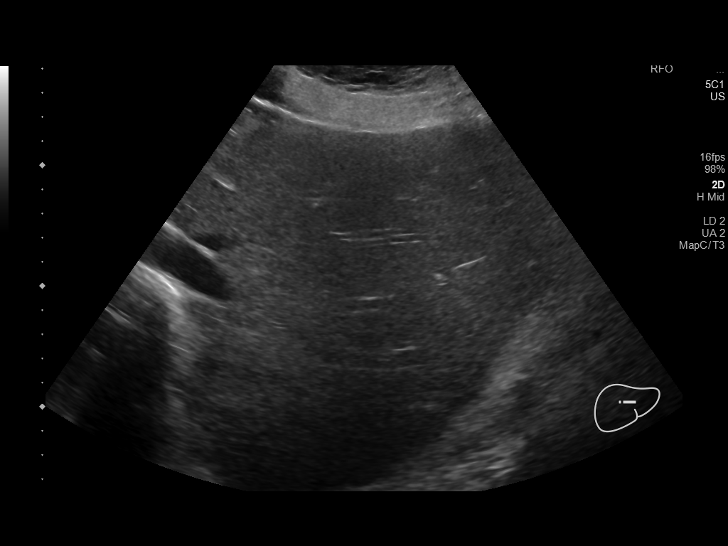
[im 32/43]
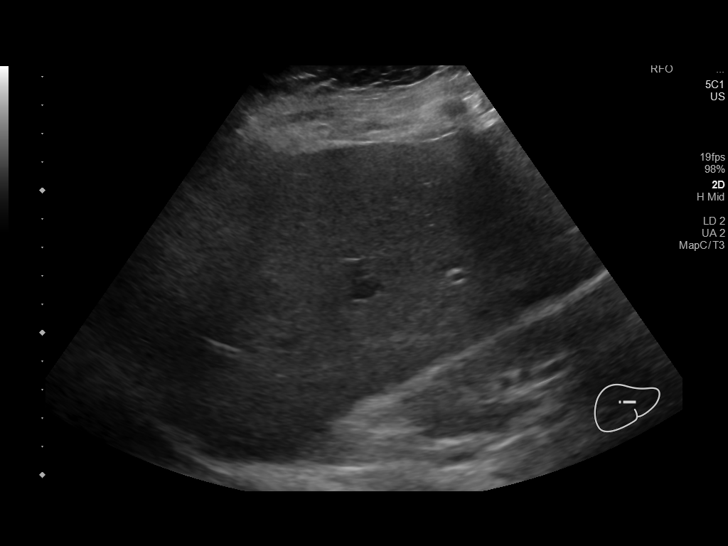
[im 36/43]
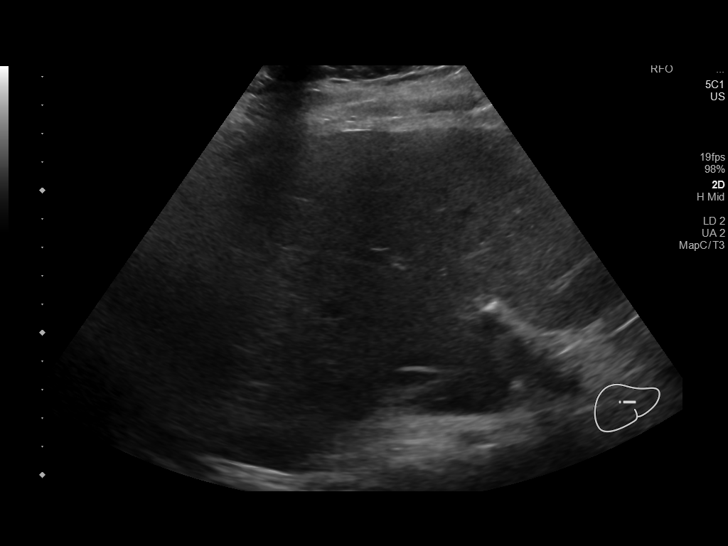
[im 39/43]
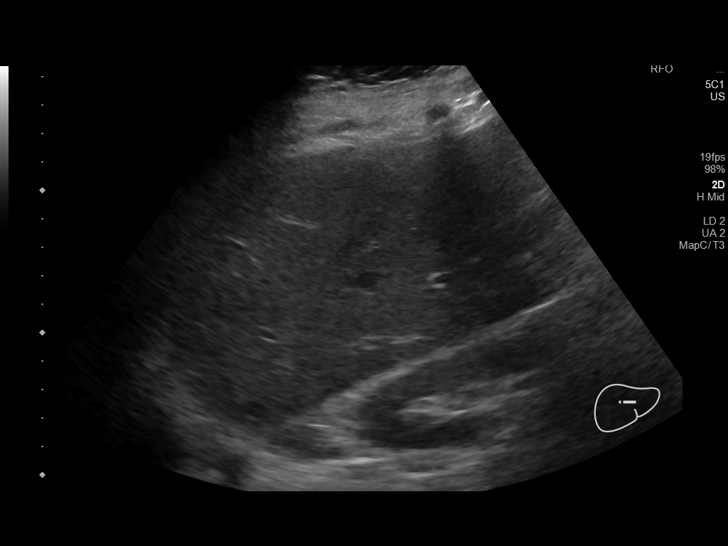
[im 43/43]
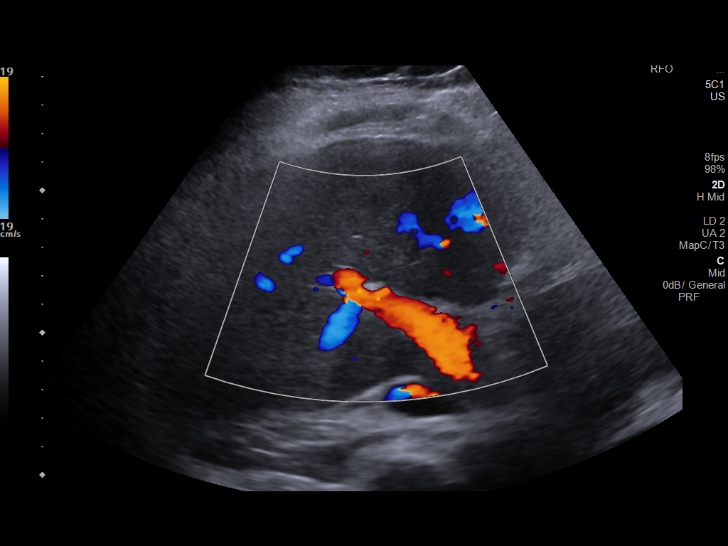

[14 of 25 positions shown; findings below may reference images not displayed]

FINDINGS: Gallbladder:

Surgically absent

Common bile duct:

Diameter: 4 mm diameter, normal

Liver:

Normal morphology. Smooth contours without mass or nodularity.
Slightly heterogeneous echogenicity which could represent subtle
geographic fatty infiltration. Portal vein is patent on color
Doppler imaging with normal direction of blood flow towards the
liver.

No RIGHT upper quadrant free fluid.
IMPRESSION: Mildly heterogeneous fatty parenchyma which could reflect areas of
mild geographic fatty infiltration.

No discrete hepatic mass or nodularity seen.

Post cholecystectomy without biliary dilatation.

## 2019-10-03 ENCOUNTER — Ambulatory Visit: Payer: Medicare Other | Admitting: Family Medicine

## 2019-10-04 IMAGING — CR DG CHEST 1V PORT
1 series · 1 of 1 positions shown · non-contrast
Comparison: CT chest of 12/10/2017 and chest x-ray of the same day

CLINICAL DATA: Respiratory distress, shortness of breath

EXAM:
PORTABLE CHEST 1 VIEW

[portable]
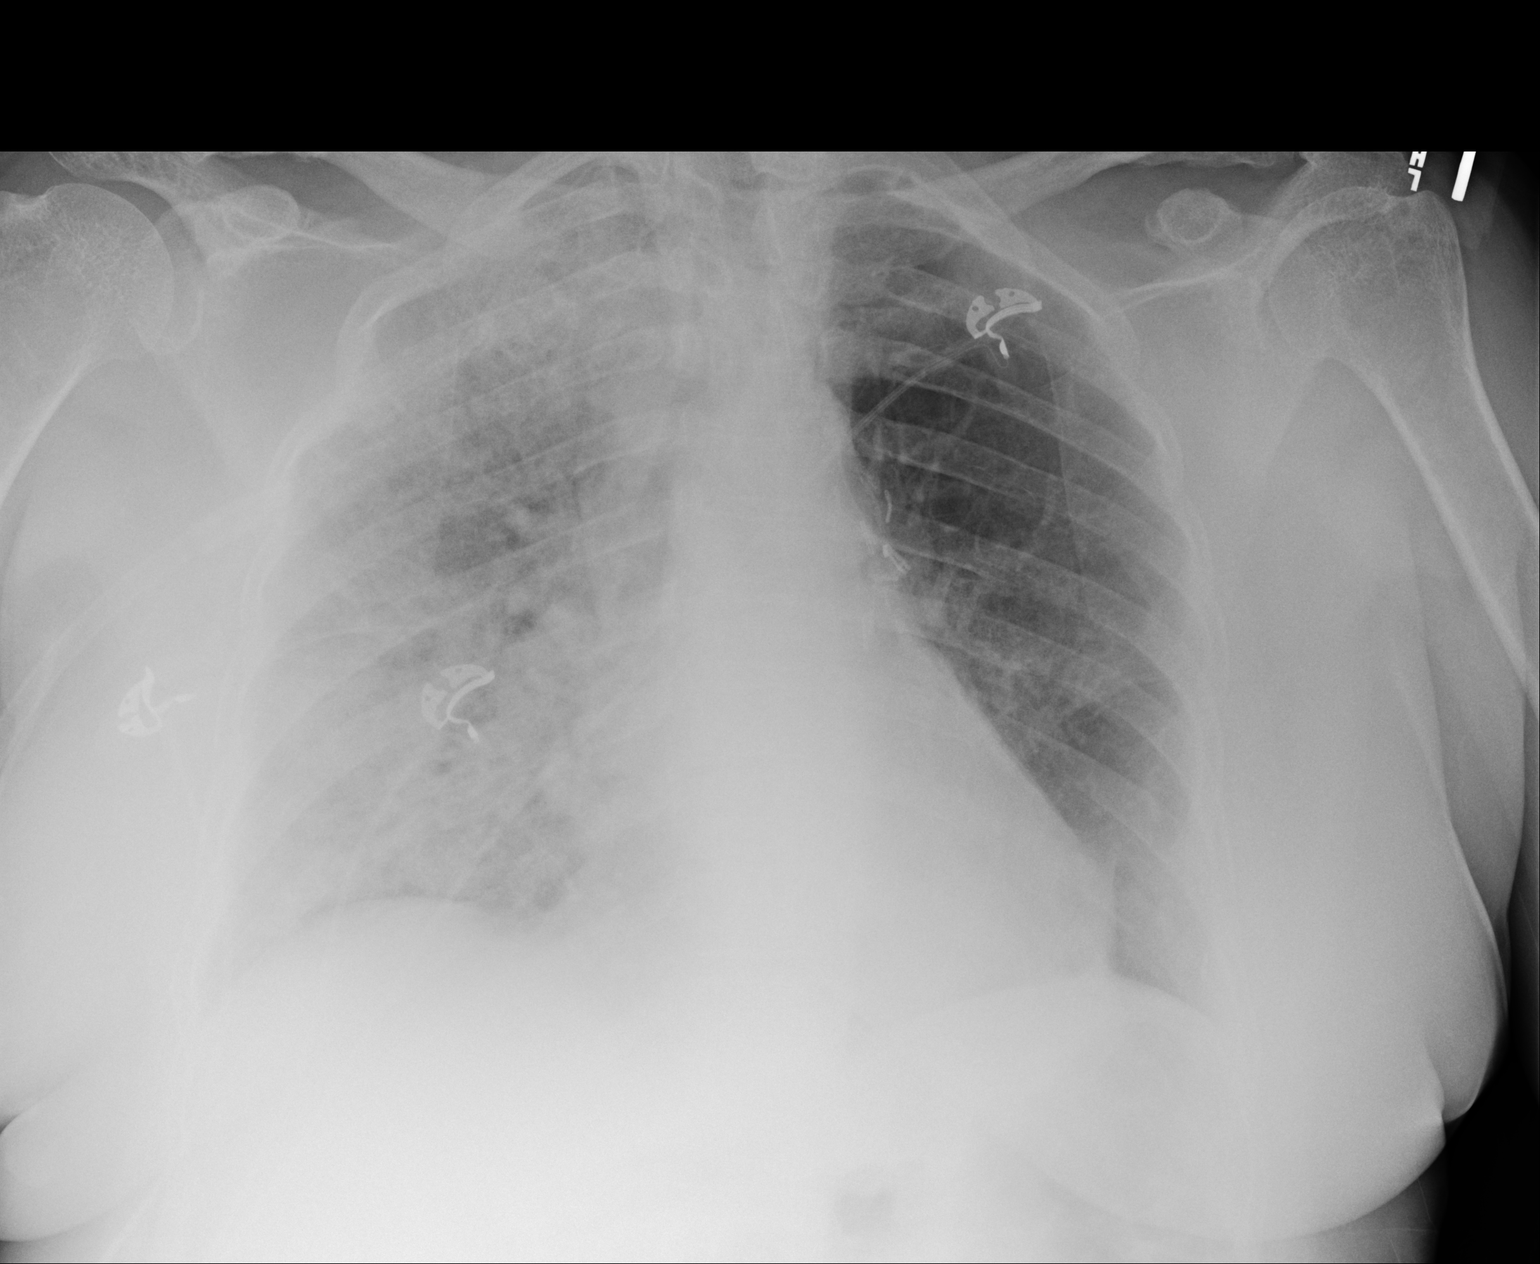

[1 of 1 positions shown; findings below may reference images not displayed]

FINDINGS: There is worsening of airspace disease throughout the entire right
lung, again most consistent with progression of pneumonia. The left
lung is clear. The heart is within upper limits normal. No bony
abnormality is seen.
IMPRESSION: Worsening of airspace disease now throughout the entire right lung
most consistent with pneumonia. The left lung is clear.

## 2019-10-08 ENCOUNTER — Other Ambulatory Visit: Payer: Self-pay | Admitting: Family Medicine

## 2019-10-10 DIAGNOSIS — Z20828 Contact with and (suspected) exposure to other viral communicable diseases: Secondary | ICD-10-CM | POA: Diagnosis not present

## 2019-10-11 ENCOUNTER — Other Ambulatory Visit (HOSPITAL_COMMUNITY): Payer: Self-pay | Admitting: Family Medicine

## 2019-10-11 DIAGNOSIS — Z1231 Encounter for screening mammogram for malignant neoplasm of breast: Secondary | ICD-10-CM

## 2019-10-17 ENCOUNTER — Other Ambulatory Visit: Payer: Self-pay

## 2019-10-17 ENCOUNTER — Encounter: Payer: Self-pay | Admitting: Family Medicine

## 2019-10-17 ENCOUNTER — Ambulatory Visit (INDEPENDENT_AMBULATORY_CARE_PROVIDER_SITE_OTHER): Payer: Medicare Other | Admitting: Family Medicine

## 2019-10-17 VITALS — BP 130/62 | HR 88 | Ht 66.0 in | Wt 180.0 lb

## 2019-10-17 DIAGNOSIS — Z23 Encounter for immunization: Secondary | ICD-10-CM | POA: Diagnosis not present

## 2019-10-17 DIAGNOSIS — K130 Diseases of lips: Secondary | ICD-10-CM

## 2019-10-17 MED ORDER — HYDROCORTISONE 1 % EX OINT
1.0000 | TOPICAL_OINTMENT | Freq: Two times a day (BID) | CUTANEOUS | 0 refills | Status: DC
Start: 2019-10-17 — End: 2020-04-10

## 2019-10-17 MED ORDER — NYSTATIN 100000 UNIT/ML MT SUSP
5.0000 mL | Freq: Four times a day (QID) | OROMUCOSAL | 0 refills | Status: DC
Start: 2019-10-17 — End: 2020-04-10

## 2019-10-17 MED ORDER — CLOTRIMAZOLE 1 % EX CREA: 1.0000 "application " | TOPICAL_CREAM | Freq: Two times a day (BID) | CUTANEOUS | 0 refills | Status: DC

## 2019-10-17 MED ORDER — BOOSTRIX 5-2.5-18.5 LF-MCG/0.5 IM SUSP: 0.5000 mL | Freq: Once | INTRAMUSCULAR | 0 refills | Status: AC

## 2019-10-17 NOTE — Progress Notes (Signed)
    SUBJECTIVE:   CHIEF COMPLAINT / HPI:   Patient presents with concerns of oral herpes at corners of her mouth. She states she has been dealing with these lesions for about 4 months and it hurts to open her mouth. States that when she would experience this prior it would come and go, but in the last several months it has not subsided. She takes acyclovir daily and she has tried Abreva, carmex and neosporin without relief. Patient also states she has been going through a lot of stress these past few months due to health issues with her family.   PERTINENT  PMH / PSH:  Genital and oral herpes, HTN, COPD, depression   OBJECTIVE:   BP 130/62   Pulse 88   Ht 5\' 6"  (1.676 m)   Wt 180 lb (81.6 kg)   SpO2 95%   BMI 29.05 kg/m    Physical Exam HENT:     Mouth/Throat:     Comments: Dry cracked corners of lips. No visible bumps or other lesions around mouth  Cardiovascular:     Rate and Rhythm: Normal rate and regular rhythm.  Pulmonary:     Breath sounds: Wheezing and rales present.  Abdominal:     General: There is no distension.     Palpations: Abdomen is soft.  Skin:    General: Skin is warm and dry.    ASSESSMENT/PLAN:   No problem-specific Assessment & Plan notes found for this encounter.   Angular cheilitis Patient experiencing dry and cracked corner of lips. Most likely due to a fungal infection. Anticholinergic medications may be contributing to this such as Amitriptyline and Bentyl.  Also consider vitamin deficiency as a potential cause  - Clotrimazole BID - petroleum jelly daily - nystatin mouth wash daily  - f/u in about 3 weeks if no change. Can consider adjusting anticholinergic medication and checking for vitamin deficiency   [place]

## 2019-10-17 NOTE — Patient Instructions (Addendum)
It was great seeing you today! It appears that you have cheilitis (dry, cracked lips) that can be difficult to treat. I am prescribing an antifungal cream to be used twice a day.  Please use this with an emollient such as petroleum jelly. Please avoid lip balms that contain flavors or preservatives. I am also prescribing oral nystatin that should help as well.   Please return in the next 3-4 weeks if this treatment does not help and we can consider adjusting the medication that could be contributing, and also check for vitamin deficiency.   Visit Reminders: - Stop by the pharmacy to pick up your prescriptions   Feel free to call with any questions or concerns at any time, at [phone].   Take care,  Dr. Shary Key [facility]

## 2019-10-24 ENCOUNTER — Other Ambulatory Visit: Payer: Self-pay | Admitting: Family Medicine

## 2019-10-24 ENCOUNTER — Other Ambulatory Visit: Payer: Self-pay | Admitting: Gastroenterology

## 2019-10-25 ENCOUNTER — Encounter: Payer: Self-pay | Admitting: Internal Medicine

## 2019-10-25 NOTE — Telephone Encounter (Signed)
Refilled X 1. Needs office visit.

## 2019-10-31 ENCOUNTER — Other Ambulatory Visit: Payer: Self-pay | Admitting: Family Medicine

## 2019-10-31 DIAGNOSIS — A609 Anogenital herpesviral infection, unspecified: Secondary | ICD-10-CM

## 2019-11-01 ENCOUNTER — Other Ambulatory Visit: Payer: Self-pay | Admitting: *Deleted

## 2019-11-01 DIAGNOSIS — A609 Anogenital herpesviral infection, unspecified: Secondary | ICD-10-CM

## 2019-11-05 ENCOUNTER — Other Ambulatory Visit: Payer: Self-pay | Admitting: Family Medicine

## 2019-11-05 DIAGNOSIS — A609 Anogenital herpesviral infection, unspecified: Secondary | ICD-10-CM

## 2019-11-12 ENCOUNTER — Inpatient Hospital Stay (HOSPITAL_COMMUNITY): Admission: RE | Admit: 2019-11-12 | Payer: Medicare Other | Source: Ambulatory Visit

## 2019-11-15 ENCOUNTER — Other Ambulatory Visit: Payer: Self-pay | Admitting: Family Medicine

## 2019-11-15 DIAGNOSIS — I10 Essential (primary) hypertension: Secondary | ICD-10-CM

## 2019-11-20 ENCOUNTER — Other Ambulatory Visit: Payer: Self-pay

## 2019-11-20 ENCOUNTER — Ambulatory Visit (INDEPENDENT_AMBULATORY_CARE_PROVIDER_SITE_OTHER): Payer: Medicare Other | Admitting: Internal Medicine

## 2019-11-20 ENCOUNTER — Encounter: Payer: Self-pay | Admitting: Internal Medicine

## 2019-11-20 ENCOUNTER — Telehealth: Payer: Self-pay | Admitting: *Deleted

## 2019-11-20 VITALS — BP 130/69 | HR 81 | Temp 97.1°F | Ht 66.0 in | Wt 182.8 lb

## 2019-11-20 DIAGNOSIS — R1310 Dysphagia, unspecified: Secondary | ICD-10-CM

## 2019-11-20 DIAGNOSIS — K219 Gastro-esophageal reflux disease without esophagitis: Secondary | ICD-10-CM

## 2019-11-20 NOTE — Patient Instructions (Signed)
Schedule an EGD with esophageal dilation (esophageal dysphagia. Hx of schatzkis ring) - propofol ASA 2  Continue Dexilant and Linzess daily  Further recommendations to follow

## 2019-11-20 NOTE — Progress Notes (Signed)
Primary Care Physician:  Shary Key, DO Primary Gastroenterologist:  Dr. Gala Romney  Pre-Procedure History & Physical: HPI:  [name] is a 59 y.o. female here for evaluation of recurrent dysphagia.  Had a somewhat of a thick Schatzki's ring seen at EGD last year which underwent dilation up to a 61 French size and then subsequent biopsy forcep disruption.  Dysphagia improved until past couple of months when she has had recurrence of similar symptoms.  Reflux, however, well controlled on Dexilant 60 mg daily.  Likewise, Linzess 290 once daily has been a "miracle drug" for her as far as combating constipation.  Has 1 good bowel movement daily.  Has not seen any blood.  Only hemorrhoids found at colonoscopy 2 years ago; scheduled for average screening in  8 years.  History of chronically mildly elevated alkaline phosphatase in the 150 range.  AMA previously -2019.  Past Medical History:  Diagnosis Date  . Anxiety   . Asthma    every now and then.  Wheezing and coughing  . Cancer ([place]) 1997   left lung  . Chronic abdominal pain   . Chronic back pain   . Chronic joint pain   . Depression   . HLD (hyperlipidemia)   . Palpitations 01/2013   chronic, intermittent  . Shortness of breath dyspnea    with exertion    Past Surgical History:  Procedure Laterality Date  . ABDOMINAL HYSTERECTOMY    . ABDOMINAL SURGERY     ovarian cyst removal  . BIOPSY N/A 11/22/2013   Procedure: GASTRIC BIOPSY;  Surgeon: Daneil Dolin, MD;  Location: [facility];  Service: Endoscopy;  Laterality: N/A;  . BLADDER SURGERY  suspension x4   x 4  . CARPAL TUNNEL RELEASE Right 07/11/2015   Procedure: RIGHT CARPAL TUNNEL RELEASE;  Surgeon: Jessy Oto, MD;  Location: [place];  Service: Orthopedics;  Laterality: Right;  . CHOLECYSTECTOMY N/A 04/05/2014   Procedure: LAPAROSCOPIC CHOLECYSTECTOMY;  Surgeon: Aviva Signs Md, MD;  Location: [facility];  Service: General;  Laterality: N/A;  .  COCCYGECTOMY N/A 04/11/2015   Procedure: COCCYGECTOMY WITH OSTEOTOMY THROUGH AREA OF DEFORMITY;  Surgeon: Jessy Oto, MD;  Location: [place];  Service: Orthopedics;  Laterality: N/A;  . COLONOSCOPY    . COLONOSCOPY WITH PROPOFOL N/A 11/22/2013   Dr. Gala Romney: Grade 3 and 4/internal hemorrhoids?"likely source of hematochezia Normal colonoscopy otherwise.   Marland Kitchen COLONOSCOPY WITH PROPOFOL N/A 09/15/2017   Dr. Emerson Monte: Hemorrhoids, next colonoscopy 10 years  . ESOPHAGOGASTRODUODENOSCOPY (EGD) WITH PROPOFOL N/A 11/22/2013   Dr. Gala Romney: Incomplete Schatzki's ring dilated and disrupted as described above.  Small hiatal hernia. Focally abnormal gastric mucosa of uncertain significance status post biopsy, reactive gastropathy, negative H.pylori  . ESOPHAGOGASTRODUODENOSCOPY (EGD) WITH PROPOFOL N/A 08/04/2015   Dr. Gala Romney: mild Schatzki's ring s/p dilation, small hiatal hernia   . ESOPHAGOGASTRODUODENOSCOPY (EGD) WITH PROPOFOL N/A 03/17/2017   Mild Schatki's ring s/p dilation, small hiatal hernia, otherwise normal  . ESOPHAGOGASTRODUODENOSCOPY (EGD) WITH PROPOFOL N/A 09/18/2018   Dr. Gala Romney: Schatzki ring status post dilation and disruption.  Hiatal hernia.  Marland Kitchen EXCISION VAGINAL CYST Left 06/20/2012   Procedure: EXCISION LEFT LABIAL CYST;  Surgeon: Jonnie Kind, MD;  Location: [facility];  Service: Gynecology;  Laterality: Left;  . LOBECTOMY Left   . LUNG CANCER SURGERY  1997   age 41, resection only  . MALONEY DILATION N/A 11/22/2013   Procedure: [name] DILATION;  Surgeon: Daneil Dolin, MD;  Location: [facility];  Service: Endoscopy;  Laterality: N/A;  56  . MALONEY DILATION N/A 08/04/2015   Procedure: [name] DILATION;  Surgeon: Daneil Dolin, MD;  [facility];  Service: Endoscopy;  Laterality: N/A;  [name] DILATION N/A 03/17/2017   Procedure: [name] DILATION;  Surgeon: Daneil Dolin, MD;  [facility];  Service: Endoscopy;  Laterality: N/A;  [name] DILATION N/A 09/18/2018   Procedure:  [name] DILATION;  Surgeon: Daneil Dolin, MD;  [facility];  Service: Endoscopy;  Laterality: N/A;  . MASS EXCISION N/A 07/23/2014   Procedure: EXCISIONAL BIOPSY NODULE LEFT PARACOCCYGEAL AREA;  Surgeon: Jessy Oto, MD;  Location: [place];  Service: Orthopedics;  Laterality: N/A;  . MASS EXCISION Left 10/10/2015   Procedure: EXCISIONAL BIOPSY OF LEFT DORSORADIAL FOREARM MASS LIPOMA;  Surgeon: Jessy Oto, MD;  Location: [place];  Service: Orthopedics;  Laterality: Left;  . TUBAL LIGATION      Prior to Admission medications   Medication Sig Start Date End Date Taking? Authorizing Provider  acyclovir (ZOVIRAX) 400 MG tablet Take 1 tablet by mouth twice daily 11/06/19  Yes Paige, [place] J, DO  albuterol (PROVENTIL) (2.5 MG/3ML) 0.083% nebulizer solution USE 1 VIAL IN NEBULIZER EVERY 6 HOURS AS NEEDED FOR WHEEZING AND FOR SHORTNESS OF BREATH 08/23/19  Yes Paige, [place] J, DO  amitriptyline (ELAVIL) 150 MG tablet TAKE 1 TABLET BY MOUTH ONCE DAILY AT NIGHT 08/23/19  Yes Paige, Victoria J, DO  amLODipine (NORVASC) 10 MG tablet Take 1 tablet by mouth once daily 11/15/19  Yes Paige, Victoria J, DO  atorvastatin (LIPITOR) 40 MG tablet Take 1 tablet (40 mg total) by mouth daily. 08/23/19  Yes Paige, Victoria J, DO  budesonide-formoterol (SYMBICORT) 160-4.5 MCG/ACT inhaler Inhale 2 puffs into the lungs 2 (two) times daily. Patient taking differently: Inhale 2 puffs into the lungs 2 (two) times daily as needed (respiratory issues.).  12/15/17  Yes Barton Dubois, MD  celecoxib (CELEBREX) 200 MG capsule Take 1 capsule by mouth once daily 10/24/19  Yes Paige, Victoria J, DO  clotrimazole (CLOTRIMAZOLE ANTI-FUNGAL) 1 % cream Apply 1 application topically 2 (two) times daily. 10/17/19  Yes Paige, Weldon Picking, DO  DEXILANT 60 MG capsule TAKE 1 CAPSULE BY MOUTH ONCE DAILY WITH BREAKFAST 10/25/19  Yes Annitta Needs, NP  dicyclomine (BENTYL) 10 MG capsule Take 1 capsule (10 mg total) by  mouth as needed for spasms (30 mins before meals and at bedtime as needed for diarrhea and abdominal cramps, no more than four per day. hold for constipation). 12/29/18  Yes Mahala Menghini, PA-C  DULoxetine (CYMBALTA) 60 MG capsule TAKE 1 CAPSULE BY MOUTH BY MOUTH TWICE DAILY 10/24/19  Yes Paige, Victoria J, DO  hydrocortisone 1 % ointment Apply 1 application topically 2 (two) times daily. Patient taking differently: Apply 1 application topically as needed.  10/17/19  Yes Paige, Weldon Picking, DO  linaclotide (LINZESS) 290 MCG CAPS capsule TAKE 1 CAPSULE BY MOUTH ONCE DAILY BEFORE BREAKFAST 08/23/19  Yes Paige, Victoria J, DO  nystatin (MYCOSTATIN) 100000 UNIT/ML suspension Take 5 mLs (500,000 Units total) by mouth 4 (four) times daily. 10/17/19  Yes Paige, Maurice, DO  pregabalin (LYRICA) 100 MG capsule TAKE 1 CAPSULE BY MOUTH THREE TIMES DAILY 10/08/19  Yes Shary Key, DO    Allergies as of 11/20/2019 - Review Complete 11/20/2019  Allergen Reaction Noted  . Promethazine hcl Nausea And Vomiting and Other (See  Comments) 07/26/2008    Family History  Problem Relation Age of Onset  . Diabetes Other   . Heart disease Other        has Psychologist, forensic  . Hypertension Mother   . Kidney disease Mother   . Hypertension Father   . Kidney disease Father   . Heart disease Father   . Cancer Father        leukemia  . Hypertension Sister   . Hypertension Sister   . Colon cancer Neg Hx     Social History   Socioeconomic History  . Marital status: Married    Spouse name: Not on file  . Number of children: 3  . Years of education: Not on file  . Highest education level: Not on file  Occupational History  . Not on file  Tobacco Use  . Smoking status: Former Smoker    Packs/day: 1.50    Years: 25.00    Pack years: 37.50    Types: Cigarettes    Quit date: 04/07/1995    Years since quitting: 24.6  . Smokeless tobacco: Never Used  Vaping Use  . Vaping Use: Never used  Substance and Sexual  Activity  . Alcohol use: No  . Drug use: No  . Sexual activity: Not Currently    Birth control/protection: Surgical  Other Topics Concern  . Not on file  Social History Narrative  . Not on file   Social Determinants of Health   Financial Resource Strain:   . Difficulty of Paying Living Expenses: Not on file  Food Insecurity:   . Worried About Charity fundraiser in the Last Year: Not on file  . Ran Out of Food in the Last Year: Not on file  Transportation Needs:   . Lack of Transportation (Medical): Not on file  . Lack of Transportation (Non-Medical): Not on file  Physical Activity:   . Days of Exercise per Week: Not on file  . Minutes of Exercise per Session: Not on file  Stress:   . Feeling of Stress : Not on file  Social Connections:   . Frequency of Communication with Friends and Family: Not on file  . Frequency of Social Gatherings with Friends and Family: Not on file  . Attends Religious Services: Not on file  . Active Member of Clubs or Organizations: Not on file  . Attends Archivist Meetings: Not on file  . Marital Status: Not on file  Intimate Partner Violence:   . Fear of Current or Ex-Partner: Not on file  . Emotionally Abused: Not on file  . Physically Abused: Not on file  . Sexually Abused: Not on file    Review of Systems: See HPI, otherwise negative ROS  Physical Exam: BP 130/69   Pulse 81   Temp (!) 97.1 F (36.2 C) (Temporal)   Ht 5\' 6"  (1.676 m)   Wt 182 lb 12.8 oz (82.9 kg)   BMI 29.50 kg/m  General:   Alert,  pleasant and cooperative in NAD Neck:  Supple; no masses or thyromegaly. No significant cervical adenopathy. Lungs:  Clear throughout to auscultation.   No wheezes, crackles, or rhonchi. No acute distress. Heart:  Regular rate and rhythm; no murmurs, clicks, rubs,  or gallops. Abdomen: Non-distended, normal bowel sounds.  Soft and nontender without appreciable mass or hepatosplenomegaly.  Pulses:  Normal pulses  noted. Extremities:  Without clubbing or edema.  Impression/Plan: Pleasant 59 year old lady with longstanding GERD.  Thick Schatzki's ring found  last year which underwent relatively large bore dilation biopsy disruption.  Marked improvement in dysphagia symptoms until just recently.  I suspect she has recurrence/persistence of the Schatzki's ring.   Mildly elevated alkaline phosphatase-nonspecific.  AMA -2019. We will go ahead and plan to set up a repeat EGD with esophageal dilation as feasible/appropriate in the near future. The risks, benefits, limitations, alternatives and imponderables have been reviewed with the patient. Potential for esophageal dilation, biopsy, etc. have also been reviewed.  Questions have been answered. All parties agreeable.  We will go ahead and schedule an EGD with esophageal dilation (esophageal dysphagia. Hx of schatzkis ring) - propofol ASA 2  Continue Dexilant and Linzess daily  Further recommendations to follow      Notice: This dictation was prepared with Dragon dictation along with smaller phrase technology. Any transcriptional errors that result from this process are unintentional and may not be corrected upon review.

## 2019-11-20 NOTE — Telephone Encounter (Signed)
Called pt. She has been scheduled for EGD/DIL with propofol, ASA 2 w/ Dr. Gala Romney on 12/2 at 7:30am. Aware will mail prep instructions with covid test appt.

## 2019-11-20 NOTE — H&P (View-Only) (Signed)
Primary Care Physician:  Shary Key, DO Primary Gastroenterologist:  Dr. Gala Romney  Pre-Procedure History & Physical: HPI:  [name] is a 59 y.o. female here for evaluation of recurrent dysphagia.  Had a somewhat of a thick Schatzki's ring seen at EGD last year which underwent dilation up to a 23 French size and then subsequent biopsy forcep disruption.  Dysphagia improved until past couple of months when she has had recurrence of similar symptoms.  Reflux, however, well controlled on Dexilant 60 mg daily.  Likewise, Linzess 290 once daily has been a "miracle drug" for her as far as combating constipation.  Has 1 good bowel movement daily.  Has not seen any blood.  Only hemorrhoids found at colonoscopy 2 years ago; scheduled for average screening in  8 years.  History of chronically mildly elevated alkaline phosphatase in the 150 range.  AMA previously -2019.  Past Medical History:  Diagnosis Date  . Anxiety   . Asthma    every now and then.  Wheezing and coughing  . Cancer ([place]) 1997   left lung  . Chronic abdominal pain   . Chronic back pain   . Chronic joint pain   . Depression   . HLD (hyperlipidemia)   . Palpitations 01/2013   chronic, intermittent  . Shortness of breath dyspnea    with exertion    Past Surgical History:  Procedure Laterality Date  . ABDOMINAL HYSTERECTOMY    . ABDOMINAL SURGERY     ovarian cyst removal  . BIOPSY N/A 11/22/2013   Procedure: GASTRIC BIOPSY;  Surgeon: Daneil Dolin, MD;  Location: [facility];  Service: Endoscopy;  Laterality: N/A;  . BLADDER SURGERY  suspension x4   x 4  . CARPAL TUNNEL RELEASE Right 07/11/2015   Procedure: RIGHT CARPAL TUNNEL RELEASE;  Surgeon: Jessy Oto, MD;  Location: [place];  Service: Orthopedics;  Laterality: Right;  . CHOLECYSTECTOMY N/A 04/05/2014   Procedure: LAPAROSCOPIC CHOLECYSTECTOMY;  Surgeon: Aviva Signs Md, MD;  Location: [facility];  Service: General;  Laterality: N/A;  .  COCCYGECTOMY N/A 04/11/2015   Procedure: COCCYGECTOMY WITH OSTEOTOMY THROUGH AREA OF DEFORMITY;  Surgeon: Jessy Oto, MD;  Location: [place];  Service: Orthopedics;  Laterality: N/A;  . COLONOSCOPY    . COLONOSCOPY WITH PROPOFOL N/A 11/22/2013   Dr. Gala Romney: Grade 3 and 4/internal hemorrhoids?"likely source of hematochezia Normal colonoscopy otherwise.   Marland Kitchen COLONOSCOPY WITH PROPOFOL N/A 09/15/2017   Dr. Emerson Monte: Hemorrhoids, next colonoscopy 10 years  . ESOPHAGOGASTRODUODENOSCOPY (EGD) WITH PROPOFOL N/A 11/22/2013   Dr. Gala Romney: Incomplete Schatzki's ring dilated and disrupted as described above.  Small hiatal hernia. Focally abnormal gastric mucosa of uncertain significance status post biopsy, reactive gastropathy, negative H.pylori  . ESOPHAGOGASTRODUODENOSCOPY (EGD) WITH PROPOFOL N/A 08/04/2015   Dr. Gala Romney: mild Schatzki's ring s/p dilation, small hiatal hernia   . ESOPHAGOGASTRODUODENOSCOPY (EGD) WITH PROPOFOL N/A 03/17/2017   Mild Schatki's ring s/p dilation, small hiatal hernia, otherwise normal  . ESOPHAGOGASTRODUODENOSCOPY (EGD) WITH PROPOFOL N/A 09/18/2018   Dr. Gala Romney: Schatzki ring status post dilation and disruption.  Hiatal hernia.  Marland Kitchen EXCISION VAGINAL CYST Left 06/20/2012   Procedure: EXCISION LEFT LABIAL CYST;  Surgeon: Jonnie Kind, MD;  Location: [facility];  Service: Gynecology;  Laterality: Left;  . LOBECTOMY Left   . LUNG CANCER SURGERY  1997   age 48, resection only  . MALONEY DILATION N/A 11/22/2013   Procedure: [name] DILATION;  Surgeon: Daneil Dolin, MD;  Location: [facility];  Service: Endoscopy;  Laterality: N/A;  56  . MALONEY DILATION N/A 08/04/2015   Procedure: [name] DILATION;  Surgeon: Daneil Dolin, MD;  [facility];  Service: Endoscopy;  Laterality: N/A;  [name] DILATION N/A 03/17/2017   Procedure: [name] DILATION;  Surgeon: Daneil Dolin, MD;  [facility];  Service: Endoscopy;  Laterality: N/A;  [name] DILATION N/A 09/18/2018   Procedure:  [name] DILATION;  Surgeon: Daneil Dolin, MD;  [facility];  Service: Endoscopy;  Laterality: N/A;  . MASS EXCISION N/A 07/23/2014   Procedure: EXCISIONAL BIOPSY NODULE LEFT PARACOCCYGEAL AREA;  Surgeon: Jessy Oto, MD;  Location: [place];  Service: Orthopedics;  Laterality: N/A;  . MASS EXCISION Left 10/10/2015   Procedure: EXCISIONAL BIOPSY OF LEFT DORSORADIAL FOREARM MASS LIPOMA;  Surgeon: Jessy Oto, MD;  Location: [place];  Service: Orthopedics;  Laterality: Left;  . TUBAL LIGATION      Prior to Admission medications   Medication Sig Start Date End Date Taking? Authorizing Provider  acyclovir (ZOVIRAX) 400 MG tablet Take 1 tablet by mouth twice daily 11/06/19  Yes Paige, [place] J, DO  albuterol (PROVENTIL) (2.5 MG/3ML) 0.083% nebulizer solution USE 1 VIAL IN NEBULIZER EVERY 6 HOURS AS NEEDED FOR WHEEZING AND FOR SHORTNESS OF BREATH 08/23/19  Yes Paige, [place] J, DO  amitriptyline (ELAVIL) 150 MG tablet TAKE 1 TABLET BY MOUTH ONCE DAILY AT NIGHT 08/23/19  Yes Paige, Victoria J, DO  amLODipine (NORVASC) 10 MG tablet Take 1 tablet by mouth once daily 11/15/19  Yes Paige, Victoria J, DO  atorvastatin (LIPITOR) 40 MG tablet Take 1 tablet (40 mg total) by mouth daily. 08/23/19  Yes Paige, Victoria J, DO  budesonide-formoterol (SYMBICORT) 160-4.5 MCG/ACT inhaler Inhale 2 puffs into the lungs 2 (two) times daily. Patient taking differently: Inhale 2 puffs into the lungs 2 (two) times daily as needed (respiratory issues.).  12/15/17  Yes Barton Dubois, MD  celecoxib (CELEBREX) 200 MG capsule Take 1 capsule by mouth once daily 10/24/19  Yes Paige, Victoria J, DO  clotrimazole (CLOTRIMAZOLE ANTI-FUNGAL) 1 % cream Apply 1 application topically 2 (two) times daily. 10/17/19  Yes Paige, Weldon Picking, DO  DEXILANT 60 MG capsule TAKE 1 CAPSULE BY MOUTH ONCE DAILY WITH BREAKFAST 10/25/19  Yes Annitta Needs, NP  dicyclomine (BENTYL) 10 MG capsule Take 1 capsule (10 mg total) by  mouth as needed for spasms (30 mins before meals and at bedtime as needed for diarrhea and abdominal cramps, no more than four per day. hold for constipation). 12/29/18  Yes Mahala Menghini, PA-C  DULoxetine (CYMBALTA) 60 MG capsule TAKE 1 CAPSULE BY MOUTH BY MOUTH TWICE DAILY 10/24/19  Yes Paige, Victoria J, DO  hydrocortisone 1 % ointment Apply 1 application topically 2 (two) times daily. Patient taking differently: Apply 1 application topically as needed.  10/17/19  Yes Paige, Weldon Picking, DO  linaclotide (LINZESS) 290 MCG CAPS capsule TAKE 1 CAPSULE BY MOUTH ONCE DAILY BEFORE BREAKFAST 08/23/19  Yes Paige, Victoria J, DO  nystatin (MYCOSTATIN) 100000 UNIT/ML suspension Take 5 mLs (500,000 Units total) by mouth 4 (four) times daily. 10/17/19  Yes Paige, [place], DO  pregabalin (LYRICA) 100 MG capsule TAKE 1 CAPSULE BY MOUTH THREE TIMES DAILY 10/08/19  Yes Shary Key, DO    Allergies as of 11/20/2019 - Review Complete 11/20/2019  Allergen Reaction Noted  . Promethazine hcl Nausea And Vomiting and Other (See  Comments) 07/26/2008    Family History  Problem Relation Age of Onset  . Diabetes Other   . Heart disease Other        has Psychologist, forensic  . Hypertension Mother   . Kidney disease Mother   . Hypertension Father   . Kidney disease Father   . Heart disease Father   . Cancer Father        leukemia  . Hypertension Sister   . Hypertension Sister   . Colon cancer Neg Hx     Social History   Socioeconomic History  . Marital status: Married    Spouse name: Not on file  . Number of children: 3  . Years of education: Not on file  . Highest education level: Not on file  Occupational History  . Not on file  Tobacco Use  . Smoking status: Former Smoker    Packs/day: 1.50    Years: 25.00    Pack years: 37.50    Types: Cigarettes    Quit date: 04/07/1995    Years since quitting: 24.6  . Smokeless tobacco: Never Used  Vaping Use  . Vaping Use: Never used  Substance and Sexual  Activity  . Alcohol use: No  . Drug use: No  . Sexual activity: Not Currently    Birth control/protection: Surgical  Other Topics Concern  . Not on file  Social History Narrative  . Not on file   Social Determinants of Health   Financial Resource Strain:   . Difficulty of Paying Living Expenses: Not on file  Food Insecurity:   . Worried About Charity fundraiser in the Last Year: Not on file  . Ran Out of Food in the Last Year: Not on file  Transportation Needs:   . Lack of Transportation (Medical): Not on file  . Lack of Transportation (Non-Medical): Not on file  Physical Activity:   . Days of Exercise per Week: Not on file  . Minutes of Exercise per Session: Not on file  Stress:   . Feeling of Stress : Not on file  Social Connections:   . Frequency of Communication with Friends and Family: Not on file  . Frequency of Social Gatherings with Friends and Family: Not on file  . Attends Religious Services: Not on file  . Active Member of Clubs or Organizations: Not on file  . Attends Archivist Meetings: Not on file  . Marital Status: Not on file  Intimate Partner Violence:   . Fear of Current or Ex-Partner: Not on file  . Emotionally Abused: Not on file  . Physically Abused: Not on file  . Sexually Abused: Not on file    Review of Systems: See HPI, otherwise negative ROS  Physical Exam: BP 130/69   Pulse 81   Temp (!) 97.1 F (36.2 C) (Temporal)   Ht 5\' 6"  (1.676 m)   Wt 182 lb 12.8 oz (82.9 kg)   BMI 29.50 kg/m  General:   Alert,  pleasant and cooperative in NAD Neck:  Supple; no masses or thyromegaly. No significant cervical adenopathy. Lungs:  Clear throughout to auscultation.   No wheezes, crackles, or rhonchi. No acute distress. Heart:  Regular rate and rhythm; no murmurs, clicks, rubs,  or gallops. Abdomen: Non-distended, normal bowel sounds.  Soft and nontender without appreciable mass or hepatosplenomegaly.  Pulses:  Normal pulses  noted. Extremities:  Without clubbing or edema.  Impression/Plan: Pleasant 59 year old lady with longstanding GERD.  Thick Schatzki's ring found  last year which underwent relatively large bore dilation biopsy disruption.  Marked improvement in dysphagia symptoms until just recently.  I suspect she has recurrence/persistence of the Schatzki's ring.   Mildly elevated alkaline phosphatase-nonspecific.  AMA -2019. We will go ahead and plan to set up a repeat EGD with esophageal dilation as feasible/appropriate in the near future. The risks, benefits, limitations, alternatives and imponderables have been reviewed with the patient. Potential for esophageal dilation, biopsy, etc. have also been reviewed.  Questions have been answered. All parties agreeable.  We will go ahead and schedule an EGD with esophageal dilation (esophageal dysphagia. Hx of schatzkis ring) - propofol ASA 2  Continue Dexilant and Linzess daily  Further recommendations to follow      Notice: This dictation was prepared with Dragon dictation along with smaller phrase technology. Any transcriptional errors that result from this process are unintentional and may not be corrected upon review.

## 2019-11-23 ENCOUNTER — Other Ambulatory Visit: Payer: Self-pay | Admitting: Family Medicine

## 2019-12-03 ENCOUNTER — Other Ambulatory Visit: Payer: Self-pay | Admitting: Family Medicine

## 2019-12-03 DIAGNOSIS — M48062 Spinal stenosis, lumbar region with neurogenic claudication: Secondary | ICD-10-CM

## 2019-12-06 ENCOUNTER — Other Ambulatory Visit: Payer: Self-pay | Admitting: *Deleted

## 2019-12-06 DIAGNOSIS — M48062 Spinal stenosis, lumbar region with neurogenic claudication: Secondary | ICD-10-CM

## 2019-12-14 ENCOUNTER — Other Ambulatory Visit: Payer: Self-pay | Admitting: Family Medicine

## 2019-12-14 DIAGNOSIS — E7800 Pure hypercholesterolemia, unspecified: Secondary | ICD-10-CM

## 2019-12-18 ENCOUNTER — Encounter (HOSPITAL_COMMUNITY): Payer: Self-pay | Admitting: Internal Medicine

## 2019-12-19 ENCOUNTER — Other Ambulatory Visit (HOSPITAL_COMMUNITY)
Admission: RE | Admit: 2019-12-19 | Discharge: 2019-12-19 | Disposition: A | Discharge status: Home / Self Care | Payer: Medicare Other | Source: Ambulatory Visit | Attending: Internal Medicine | Admitting: Internal Medicine

## 2019-12-19 ENCOUNTER — Other Ambulatory Visit: Payer: Self-pay

## 2019-12-19 DIAGNOSIS — Z01812 Encounter for preprocedural laboratory examination: Secondary | ICD-10-CM | POA: Diagnosis not present

## 2019-12-19 DIAGNOSIS — Z20822 Contact with and (suspected) exposure to covid-19: Secondary | ICD-10-CM | POA: Insufficient documentation

## 2019-12-19 LAB — SARS CORONAVIRUS 2 (TAT 6-24 HRS): SARS Coronavirus 2: NEGATIVE

## 2019-12-20 ENCOUNTER — Encounter (HOSPITAL_COMMUNITY): Payer: Self-pay | Admitting: Internal Medicine

## 2019-12-20 ENCOUNTER — Encounter (HOSPITAL_COMMUNITY)
Admission: RE | Disposition: A | Discharge status: Home / Self Care | Payer: Self-pay | Source: Home / Self Care | Attending: Internal Medicine

## 2019-12-20 ENCOUNTER — Ambulatory Visit (HOSPITAL_COMMUNITY): Payer: Medicare Other | Admitting: Anesthesiology

## 2019-12-20 ENCOUNTER — Ambulatory Visit (HOSPITAL_COMMUNITY)
Admission: RE | Admit: 2019-12-20 | Discharge: 2019-12-20 | Disposition: A | Discharge status: Home / Self Care | Payer: Medicare Other | Attending: Internal Medicine | Admitting: Internal Medicine

## 2019-12-20 ENCOUNTER — Other Ambulatory Visit: Payer: Self-pay

## 2019-12-20 DIAGNOSIS — Z79899 Other long term (current) drug therapy: Secondary | ICD-10-CM | POA: Diagnosis not present

## 2019-12-20 DIAGNOSIS — J449 Chronic obstructive pulmonary disease, unspecified: Secondary | ICD-10-CM | POA: Diagnosis not present

## 2019-12-20 DIAGNOSIS — K219 Gastro-esophageal reflux disease without esophagitis: Secondary | ICD-10-CM | POA: Insufficient documentation

## 2019-12-20 DIAGNOSIS — N189 Chronic kidney disease, unspecified: Secondary | ICD-10-CM | POA: Diagnosis not present

## 2019-12-20 DIAGNOSIS — K449 Diaphragmatic hernia without obstruction or gangrene: Secondary | ICD-10-CM | POA: Insufficient documentation

## 2019-12-20 DIAGNOSIS — Z7951 Long term (current) use of inhaled steroids: Secondary | ICD-10-CM | POA: Insufficient documentation

## 2019-12-20 DIAGNOSIS — Z791 Long term (current) use of non-steroidal anti-inflammatories (NSAID): Secondary | ICD-10-CM | POA: Diagnosis not present

## 2019-12-20 DIAGNOSIS — K222 Esophageal obstruction: Secondary | ICD-10-CM

## 2019-12-20 DIAGNOSIS — Z87891 Personal history of nicotine dependence: Secondary | ICD-10-CM | POA: Insufficient documentation

## 2019-12-20 DIAGNOSIS — I129 Hypertensive chronic kidney disease with stage 1 through stage 4 chronic kidney disease, or unspecified chronic kidney disease: Secondary | ICD-10-CM | POA: Diagnosis not present

## 2019-12-20 DIAGNOSIS — R1310 Dysphagia, unspecified: Secondary | ICD-10-CM | POA: Diagnosis not present

## 2019-12-20 HISTORY — PX: MALONEY DILATION: SHX5535

## 2019-12-20 HISTORY — PX: ESOPHAGOGASTRODUODENOSCOPY (EGD) WITH PROPOFOL: SHX5813

## 2019-12-20 SURGERY — ESOPHAGOGASTRODUODENOSCOPY (EGD) WITH PROPOFOL
Anesthesia: General

## 2019-12-20 MED ORDER — LIDOCAINE HCL (PF) 2 % IJ SOLN
INTRAMUSCULAR | Status: AC
  Filled 2019-12-20: qty 25

## 2019-12-20 MED ORDER — PROPOFOL 10 MG/ML IV BOLUS
INTRAVENOUS | Status: AC
  Filled 2019-12-20: qty 160

## 2019-12-20 MED ORDER — GLYCOPYRROLATE 0.2 MG/ML IJ SOLN
INTRAMUSCULAR | Status: AC
  Filled 2019-12-20: qty 1

## 2019-12-20 MED ORDER — PROPOFOL 500 MG/50ML IV EMUL
INTRAVENOUS | Status: DC | PRN
  Administered 2019-12-20: 125 ug/kg/min via INTRAVENOUS

## 2019-12-20 MED ORDER — KETAMINE HCL 10 MG/ML IJ SOLN
INTRAMUSCULAR | Status: DC | PRN
  Administered 2019-12-20 (×2): 10 mg via INTRAVENOUS

## 2019-12-20 MED ORDER — LACTATED RINGERS IV SOLN: INTRAVENOUS | Status: DC

## 2019-12-20 MED ORDER — LIDOCAINE VISCOUS HCL 2 % MT SOLN
OROMUCOSAL | Status: AC
  Filled 2019-12-20: qty 15

## 2019-12-20 MED ORDER — LIDOCAINE HCL (CARDIAC) PF 100 MG/5ML IV SOSY
PREFILLED_SYRINGE | INTRAVENOUS | Status: DC | PRN
  Administered 2019-12-20: 20 mg via INTRAVENOUS

## 2019-12-20 MED ORDER — PROPOFOL 10 MG/ML IV BOLUS
INTRAVENOUS | Status: DC | PRN
  Administered 2019-12-20 (×4): 40 mg via INTRAVENOUS

## 2019-12-20 MED ORDER — KETAMINE HCL 50 MG/5ML IJ SOSY
PREFILLED_SYRINGE | INTRAMUSCULAR | Status: AC
  Filled 2019-12-20: qty 5

## 2019-12-20 MED ORDER — LIDOCAINE VISCOUS HCL 2 % MT SOLN
15.0000 mL | Freq: Once | OROMUCOSAL | Status: AC
  Administered 2019-12-20: 15 mL via OROMUCOSAL

## 2019-12-20 MED ORDER — GLYCOPYRROLATE 0.2 MG/ML IJ SOLN
0.2000 mg | Freq: Once | INTRAMUSCULAR | Status: AC
  Administered 2019-12-20: 0.2 mg via INTRAVENOUS

## 2019-12-20 NOTE — Anesthesia Postprocedure Evaluation (Signed)
Anesthesia Post Note  Patient: [name]  Procedure(s) Performed: ESOPHAGOGASTRODUODENOSCOPY (EGD) WITH PROPOFOL (N/A ) MALONEY DILATION (N/A )  Patient location during evaluation: Endoscopy Anesthesia Type: General and MAC Level of consciousness: awake, patient cooperative and oriented Pain management: pain level controlled Vital Signs Assessment: post-procedure vital signs reviewed and stable Respiratory status: spontaneous breathing Cardiovascular status: stable Postop Assessment: no apparent nausea or vomiting Anesthetic complications: no   No complications documented.   Last Vitals:  Vitals:   12/20/19 0641 12/20/19 0802  BP: (!) 131/57   Pulse: 79 (!) 102  Resp: 18 (!) 21  Temp: 36.8 C 36.6 C  SpO2: 95% 94%    Last Pain:  Vitals:   12/20/19 0802  TempSrc: Oral  PainSc: 0-No pain                 Everette Rank

## 2019-12-20 NOTE — Interval H&P Note (Signed)
History and Physical Interval Note:  12/20/2019 7:24 AM  [name]  has presented today for surgery, with the diagnosis of dysphagia, hx of schatzkis ring.  The various methods of treatment have been discussed with the patient and family. After consideration of risks, benefits and other options for treatment, the patient has consented to  Procedure(s) with comments: ESOPHAGOGASTRODUODENOSCOPY (EGD) WITH PROPOFOL (N/A) - 7:30am MALONEY DILATION (N/A) as a surgical intervention.  The patient's history has been reviewed, patient examined, no change in status, stable for surgery.  I have reviewed the patient's chart and labs.  Questions were answered to the patient's satisfaction.     [name]  No change.  EGD with eating as feasible/appropriate today per plan. The risks, benefits, limitations, alternatives and imponderables have been reviewed with the patient. Potential for esophageal dilation, biopsy, etc. have also been reviewed.  Questions have been answered. All parties agreeable.

## 2019-12-20 NOTE — Discharge Instructions (Signed)
EGD Discharge instructions Please read the instructions outlined below and refer to this sheet in the next few weeks. These discharge instructions provide you with general information on caring for yourself after you leave the hospital. Your doctor may also give you specific instructions. While your treatment has been planned according to the most current medical practices available, unavoidable complications occasionally occur. If you have any problems or questions after discharge, please call your doctor. ACTIVITY  You may resume your regular activity but move at a slower pace for the next 24 hours.   Take frequent rest periods for the next 24 hours.   Walking will help expel (get rid of) the air and reduce the bloated feeling in your abdomen.   No driving for 24 hours (because of the anesthesia (medicine) used during the test).   You may shower.   Do not sign any important legal documents or operate any machinery for 24 hours (because of the anesthesia used during the test).  NUTRITION  Drink plenty of fluids.   You may resume your normal diet.   Begin with a light meal and progress to your normal diet.   Avoid alcoholic beverages for 24 hours or as instructed by your caregiver.  MEDICATIONS  You may resume your normal medications unless your caregiver tells you otherwise.  WHAT YOU CAN EXPECT TODAY  You may experience abdominal discomfort such as a feeling of fullness or gas pains.  FOLLOW-UP  Your doctor will discuss the results of your test with you.  SEEK IMMEDIATE MEDICAL ATTENTION IF ANY OF THE FOLLOWING OCCUR:  Excessive nausea (feeling sick to your stomach) and/or vomiting.   Severe abdominal pain and distention (swelling).   Trouble swallowing.   Temperature over 101 F (37.8 C).   Rectal bleeding or vomiting of blood.   Your esophagus was stretched today  Continue Dexilant every day  Use Chloraseptic spray for any transient sore throat you may  experience  Office visit with [place] in 6 months if not already scheduled  Need to see the dentist to improve chewing abilities  At patient request, I called Melvyn Neth at [phone]-reviewed impression and recommendations

## 2019-12-20 NOTE — Anesthesia Preprocedure Evaluation (Signed)
Anesthesia Evaluation  Patient identified by MRN, date of birth, ID band Patient awake    Reviewed: Allergy & Precautions, H&P , NPO status , Patient's Chart, lab work & pertinent test results, reviewed documented beta blocker date and time   Airway Mallampati: II  TM Distance: >3 FB Neck ROM: full    Dental no notable dental hx.    Pulmonary shortness of breath, asthma , sleep apnea , COPD,  COPD inhaler, former smoker,    Pulmonary exam normal breath sounds clear to auscultation       Cardiovascular Exercise Tolerance: Good hypertension,  Rhythm:regular Rate:Normal     Neuro/Psych PSYCHIATRIC DISORDERS Anxiety Depression  Neuromuscular disease    GI/Hepatic Neg liver ROS, GERD  Medicated,  Endo/Other  negative endocrine ROS  Renal/GU ARFRenal disease  negative genitourinary   Musculoskeletal   Abdominal   Peds  Hematology  (+) Blood dyscrasia, anemia ,   Anesthesia Other Findings   Reproductive/Obstetrics negative OB ROS                             Anesthesia Physical Anesthesia Plan  ASA: III  Anesthesia Plan: General   Post-op Pain Management:    Induction:   PONV Risk Score and Plan: Propofol infusion  Airway Management Planned:   Additional Equipment:   Intra-op Plan:   Post-operative Plan:   Informed Consent: I have reviewed the patients History and Physical, chart, labs and discussed the procedure including the risks, benefits and alternatives for the proposed anesthesia with the patient or authorized representative who has indicated his/her understanding and acceptance.     Dental Advisory Given  Plan Discussed with: CRNA  Anesthesia Plan Comments:         Anesthesia Quick Evaluation

## 2019-12-20 NOTE — Transfer of Care (Signed)
Immediate Anesthesia Transfer of Care Note  Patient: [name]  Procedure(s) Performed: ESOPHAGOGASTRODUODENOSCOPY (EGD) WITH PROPOFOL (N/A ) MALONEY DILATION (N/A )  Patient Location: Endoscopy Unit  Anesthesia Type:MAC and General  Level of Consciousness: awake, alert  and patient cooperative  Airway & Oxygen Therapy: Patient Spontanous Breathing  Post-op Assessment: Report given to RN and Post -op Vital signs reviewed and stable  Post vital signs: Reviewed and stable  Last Vitals:  Vitals Value Taken Time  BP    Temp    Pulse    Resp    SpO2      Last Pain:  Vitals:   12/20/19 0730  TempSrc:   PainSc: 0-No pain      Patients Stated Pain Goal: 9 (80/03/49 1791)  Complications: No complications documented.

## 2019-12-20 NOTE — Op Note (Signed)
[facility] Patient Name: [name] Procedure Date: 12/20/2019 7:02 AM MRN: [id number] Date of Birth: [birth year] Attending MD: Norvel Richards , MD CSN: [id number] Age: 59 Admit Type: Outpatient Procedure:                Upper GI endoscopy Indications:              Dysphagia Providers:                Norvel Richards, MD, Jeanann Lewandowsky. Sharon Seller, RN,                            Raphael Gibney, Technician Referring MD:              Medicines:                Propofol per Anesthesia Complications:            No immediate complications. Estimated Blood Loss:     Estimated blood loss was minimal. Procedure:                Pre-Anesthesia Assessment:                           - Prior to the procedure, a History and Physical                            was performed, and patient medications and                            allergies were reviewed. The patient's tolerance of                            previous anesthesia was also reviewed. The risks                            and benefits of the procedure and the sedation                            options and risks were discussed with the patient.                            All questions were answered, and informed consent                            was obtained. Prior Anticoagulants: The patient has                            taken no previous anticoagulant or antiplatelet                            agents. ASA Grade Assessment: III - A patient with                            severe systemic disease. After reviewing the risks  and benefits, the patient was deemed in                            satisfactory condition to undergo the procedure.                           After obtaining informed consent, the endoscope was                            passed under direct vision. Throughout the                            procedure, the patient's blood pressure, pulse, and                            oxygen saturations  were monitored continuously. The                            Endoscope was introduced through the mouth, and                            advanced to the second part of duodenum. The upper                            GI endoscopy was accomplished without difficulty.                            The patient tolerated the procedure well. Scope In: 7:36:32 AM Scope Out: 7:48:43 AM Total Procedure Duration: 0 hours 12 minutes 11 seconds  Findings:      A non-obstructing Schatzki ring was found at the gastroesophageal       junction. Esophagus otherwise normal; The scope was withdrawn. Dilation       was performed with a Maloney dilator with mild resistance at 56 Fr. .       Dilation was performed with a Maloney dilator 58. Dilation was performed       with a Maloney dilator with mild resistance at 60 Fr. Alook back showe       improvement with minimla blleding; no apparent complicatio0n      A small hiatal hernia was present. Impression:               - Non-obstructing Schatzki ring. Dilated.                           - Small hiatal hernia.                           - No specimens collected. Moderate Sedation:      Moderate (conscious) sedation was personally administered by an       anesthesia professional. The following parameters were monitored: oxygen       saturation, heart rate, blood pressure, respiratory rate, EKG, adequacy       of pulmonary ventilation, and response to care. Recommendation:           - Patient has a contact number available for  emergencies. The signs and symptoms of potential                            delayed complications were discussed with the                            patient. Return to normal activities tomorrow.                            Written discharge instructions were provided to the                            patient.                           - Advance diet as tolerated today. C ontiinue                            Dexilant 60  mg daily Procedure Code(s):        --- Professional ---                           [phone], Esophagogastroduodenoscopy, flexible,                            transoral; diagnostic, including collection of                            specimen(s) by brushing or washing, when performed                            (separate procedure)                           43450, Dilation of esophagus, by unguided sound or                            bougie, single or multiple passes Diagnosis Code(s):        --- Professional ---                           K22.2, Esophageal obstruction                           K44.9, Diaphragmatic hernia without obstruction or                            gangrene                           R13.10, Dysphagia, unspecified CPT copyright 2019 American Medical Association. All rights reserved. The codes documented in this report are preliminary and upon coder review may  be revised to meet current compliance requirements. [name]. [name], MD Norvel Richards, MD 12/20/2019 8:08:09 AM This report has been signed electronically. Number of Addenda: 0

## 2019-12-24 ENCOUNTER — Other Ambulatory Visit: Payer: Self-pay | Admitting: Gastroenterology

## 2019-12-25 ENCOUNTER — Other Ambulatory Visit: Payer: Self-pay | Admitting: Family Medicine

## 2019-12-26 ENCOUNTER — Encounter (HOSPITAL_COMMUNITY): Payer: Self-pay | Admitting: Internal Medicine

## 2019-12-27 ENCOUNTER — Other Ambulatory Visit: Payer: Self-pay

## 2019-12-27 ENCOUNTER — Emergency Department (HOSPITAL_COMMUNITY)
Admission: EM | Admit: 2019-12-27 | Discharge: 2019-12-27 | Disposition: A | Discharge status: Home / Self Care | Payer: Medicare Other | Attending: Medical | Admitting: Medical

## 2019-12-27 ENCOUNTER — Encounter (HOSPITAL_COMMUNITY): Payer: Self-pay

## 2019-12-27 DIAGNOSIS — R109 Unspecified abdominal pain: Secondary | ICD-10-CM | POA: Insufficient documentation

## 2019-12-27 DIAGNOSIS — Z5321 Procedure and treatment not carried out due to patient leaving prior to being seen by health care provider: Secondary | ICD-10-CM | POA: Insufficient documentation

## 2019-12-27 LAB — CBC
HCT: 43.2 % (ref 36.0–46.0)
Hemoglobin: 14.2 g/dL (ref 12.0–15.0)
MCH: 31.4 pg (ref 26.0–34.0)
MCHC: 32.9 g/dL (ref 30.0–36.0)
MCV: 95.6 fL (ref 80.0–100.0)
Platelets: 316 10*3/uL (ref 150–400)
RBC: 4.52 MIL/uL (ref 3.87–5.11)
RDW: 12.2 % (ref 11.5–15.5)
WBC: 5.5 10*3/uL (ref 4.0–10.5)
nRBC: 0 % (ref 0.0–0.2)

## 2019-12-27 LAB — BASIC METABOLIC PANEL WITH GFR
Anion gap: 10 (ref 5–15)
BUN: 7 mg/dL (ref 6–20)
CO2: 28 mmol/L (ref 22–32)
Calcium: 9.2 mg/dL (ref 8.9–10.3)
Chloride: 98 mmol/L (ref 98–111)
Creatinine, Ser: 0.8 mg/dL (ref 0.44–1.00)
GFR, Estimated: >60 mL/min
Glucose, Bld: 98 mg/dL (ref 70–99)
Potassium: 3.2 mmol/L — ABNORMAL LOW (ref 3.5–5.1)
Sodium: 136 mmol/L (ref 135–145)

## 2019-12-27 NOTE — ED Triage Notes (Signed)
Left flank pain since yesterday that radiates along left abd, hx of lumbar spinal. Sharp shooting pain, states swelling. No med intervention. No hx of renal stones. NAD

## 2019-12-28 DIAGNOSIS — M545 Low back pain, unspecified: Secondary | ICD-10-CM | POA: Diagnosis not present

## 2019-12-31 ENCOUNTER — Other Ambulatory Visit: Payer: Self-pay | Admitting: Family Medicine

## 2020-01-08 DIAGNOSIS — R0981 Nasal congestion: Secondary | ICD-10-CM | POA: Diagnosis not present

## 2020-01-08 DIAGNOSIS — R059 Cough, unspecified: Secondary | ICD-10-CM | POA: Diagnosis not present

## 2020-01-08 DIAGNOSIS — J0100 Acute maxillary sinusitis, unspecified: Secondary | ICD-10-CM | POA: Diagnosis not present

## 2020-01-08 DIAGNOSIS — R6883 Chills (without fever): Secondary | ICD-10-CM | POA: Diagnosis not present

## 2020-01-08 DIAGNOSIS — R0602 Shortness of breath: Secondary | ICD-10-CM | POA: Diagnosis not present

## 2020-01-08 DIAGNOSIS — J40 Bronchitis, not specified as acute or chronic: Secondary | ICD-10-CM | POA: Diagnosis not present

## 2020-01-10 ENCOUNTER — Other Ambulatory Visit: Payer: Self-pay | Admitting: Family Medicine

## 2020-01-28 ENCOUNTER — Ambulatory Visit: Payer: Medicare Other | Admitting: Specialist

## 2020-02-15 ENCOUNTER — Telehealth: Payer: Self-pay | Admitting: Specialist

## 2020-02-15 NOTE — Telephone Encounter (Signed)
I called and sched her with Jeneen Rinks for 02/20/20 @ 945

## 2020-02-15 NOTE — Telephone Encounter (Signed)
Pt called and said she's having neck pain. She said when she turns her neck to the right her tongue tingles. CB [phone] she wants to know if she should come here or her PCP

## 2020-02-17 DIAGNOSIS — R197 Diarrhea, unspecified: Secondary | ICD-10-CM | POA: Diagnosis not present

## 2020-02-17 DIAGNOSIS — R1110 Vomiting, unspecified: Secondary | ICD-10-CM | POA: Diagnosis not present

## 2020-02-17 DIAGNOSIS — M791 Myalgia, unspecified site: Secondary | ICD-10-CM | POA: Diagnosis not present

## 2020-02-17 DIAGNOSIS — Z20822 Contact with and (suspected) exposure to covid-19: Secondary | ICD-10-CM | POA: Diagnosis not present

## 2020-02-18 ENCOUNTER — Other Ambulatory Visit: Payer: Self-pay | Admitting: Family Medicine

## 2020-02-18 DIAGNOSIS — I10 Essential (primary) hypertension: Secondary | ICD-10-CM

## 2020-02-19 DIAGNOSIS — R531 Weakness: Secondary | ICD-10-CM | POA: Diagnosis not present

## 2020-02-19 DIAGNOSIS — Z20822 Contact with and (suspected) exposure to covid-19: Secondary | ICD-10-CM | POA: Diagnosis not present

## 2020-02-19 DIAGNOSIS — Z79899 Other long term (current) drug therapy: Secondary | ICD-10-CM | POA: Diagnosis not present

## 2020-02-19 DIAGNOSIS — R509 Fever, unspecified: Secondary | ICD-10-CM | POA: Diagnosis not present

## 2020-02-19 DIAGNOSIS — R059 Cough, unspecified: Secondary | ICD-10-CM | POA: Diagnosis not present

## 2020-02-19 DIAGNOSIS — R197 Diarrhea, unspecified: Secondary | ICD-10-CM | POA: Diagnosis not present

## 2020-02-20 ENCOUNTER — Ambulatory Visit: Payer: Medicare HMO | Admitting: Surgery

## 2020-02-26 ENCOUNTER — Other Ambulatory Visit: Payer: Self-pay | Admitting: Family Medicine

## 2020-02-27 ENCOUNTER — Ambulatory Visit: Payer: Medicare HMO | Admitting: Surgery

## 2020-03-04 ENCOUNTER — Other Ambulatory Visit: Payer: Self-pay | Admitting: Family Medicine

## 2020-03-06 ENCOUNTER — Other Ambulatory Visit: Payer: Self-pay

## 2020-03-06 DIAGNOSIS — M48062 Spinal stenosis, lumbar region with neurogenic claudication: Secondary | ICD-10-CM

## 2020-03-07 MED ORDER — CELECOXIB 200 MG PO CAPS
200.0000 mg | ORAL_CAPSULE | Freq: Every day | ORAL | 0 refills | Status: DC
Start: 2020-03-07 — End: 2020-05-04

## 2020-03-07 MED ORDER — AMITRIPTYLINE HCL 150 MG PO TABS: ORAL_TABLET | ORAL | 0 refills | Status: DC

## 2020-03-10 ENCOUNTER — Other Ambulatory Visit: Payer: Self-pay | Admitting: *Deleted

## 2020-03-10 DIAGNOSIS — M48062 Spinal stenosis, lumbar region with neurogenic claudication: Secondary | ICD-10-CM

## 2020-03-10 MED ORDER — AMITRIPTYLINE HCL 150 MG PO TABS: ORAL_TABLET | ORAL | 0 refills | Status: DC

## 2020-03-19 ENCOUNTER — Other Ambulatory Visit: Payer: Self-pay | Admitting: Family Medicine

## 2020-03-19 DIAGNOSIS — E7800 Pure hypercholesterolemia, unspecified: Secondary | ICD-10-CM

## 2020-03-20 DIAGNOSIS — H5789 Other specified disorders of eye and adnexa: Secondary | ICD-10-CM | POA: Diagnosis not present

## 2020-03-20 DIAGNOSIS — H5712 Ocular pain, left eye: Secondary | ICD-10-CM | POA: Diagnosis not present

## 2020-03-25 ENCOUNTER — Other Ambulatory Visit: Payer: Self-pay

## 2020-03-25 ENCOUNTER — Encounter: Payer: Self-pay | Admitting: Family Medicine

## 2020-03-25 ENCOUNTER — Ambulatory Visit (INDEPENDENT_AMBULATORY_CARE_PROVIDER_SITE_OTHER): Payer: Medicare HMO | Admitting: Family Medicine

## 2020-03-25 VITALS — BP 138/58 | HR 83 | Ht 65.0 in | Wt 181.6 lb

## 2020-03-25 DIAGNOSIS — H1033 Unspecified acute conjunctivitis, bilateral: Secondary | ICD-10-CM | POA: Insufficient documentation

## 2020-03-25 NOTE — Assessment & Plan Note (Signed)
Ongoing for approximately 6 days.  Was given a antibiotic eyedrop that has not improved symptoms.  Given the patient's concerns of blurred vision occasional pain with EOM, and the significant erythema and swelling noted on exam, we have scheduled the patient appointment with an ophthalmologist to [facility] ophthalmology for 10 AM today.  Advised patient to follow-up with [place] after the ophthalmology appointment in the next 1 to 2 weeks.  Differential is broad including keratoconjunctivitis, uveitis, glaucoma,ocular rosacea shingles unlikely due to the sudden onset of the bilateral nature and the lack of rash elsewhere.  Unlikely to be related to her HSV as well due to her chronic suppressive therapy.

## 2020-03-25 NOTE — Patient Instructions (Signed)
It was nice to meet today,  I have talked to [facility] ophthalmology.  They said they can see you at 10.  I would go over there right now.  The address is listed below.  This is something that should get addressed sooner rather than later so please try to make this appointment today.  [facility] ophthalmology [place], [place],  [id number]  Have a great day,  Clemetine Marker, MD

## 2020-03-25 NOTE — Progress Notes (Signed)
    SUBJECTIVE:   CHIEF COMPLAINT / HPI:   Eye pain: Patient states that last Wednesday her left eye had redness and swelling when she woke up.  The next day she had redness and swelling on the right eye as well.  She went to the urgent care Thursday.  That day she also had a left-sided facial droop that she has had in the past multiple times.  This has always resolved in 1 day and it resolved in less than 1 day this time.  She has never had issues like this regarding her eye in the past.  Patient endorses pinkish discharge (right more than left), pain with occasional extraocular movement, tenderness to palpation of the periorbital region, swelling of the eyes with generalized blurred vision both eyes.  She states it feels like there is a "film" over her eyes.  Does not wear contacts.  Has not used eyedrops other than what was prescribed to her at the urgent care last Thursday (sulfacetamide).  She is resting not had any insect bites.  Has a history of oral herpes for which she takes acyclovir daily.  No history of shingles.  No rash on her face or scalp that she has noticed.   PERTINENT  PMH / PSH: HSV  OBJECTIVE:   BP (!) 138/58   Pulse 83   Ht 5\' 5"  (1.651 m)   Wt 181 lb 9.6 oz (82.4 kg)   SpO2 96%   BMI 30.22 kg/m   General: Alert and oriented HEENT: Bilateral erythema of the conjunctiva bilaterally covering the entirety of the visible sclera.  Right is worse than left side.  There is periorbital edema in both eyes.  Periorbital region is tender to palpation.  Extraocular movements are intact.  Pupils are equal and reactive.  There is some noticeable erythematous tissue on the medial aspect of the right eye visible when the patient looks to the right.  See picture below.       ASSESSMENT/PLAN:   Acute conjunctivitis of both eyes Ongoing for approximately 6 days.  Was given a antibiotic eyedrop that has not improved symptoms.  Given the patient's concerns of blurred vision  occasional pain with EOM, and the significant erythema and swelling noted on exam, we have scheduled the patient appointment with an ophthalmologist to [facility] ophthalmology for 10 AM today.  Advised patient to follow-up with [place] after the ophthalmology appointment in the next 1 to 2 weeks.  Differential is broad including keratoconjunctivitis, uveitis, glaucoma,ocular rosacea shingles unlikely due to the sudden onset of the bilateral nature and the lack of rash elsewhere.  Unlikely to be related to her HSV as well due to her chronic suppressive therapy.     Benay Pike, MD [place]

## 2020-03-27 ENCOUNTER — Other Ambulatory Visit: Payer: Self-pay | Admitting: Family Medicine

## 2020-03-27 DIAGNOSIS — A609 Anogenital herpesviral infection, unspecified: Secondary | ICD-10-CM

## 2020-03-28 NOTE — Telephone Encounter (Signed)
Patient calls nurse line to check on status of rx refill. Patient reports that pharmacy has been faxing our office since Monday. Informed patient that we just received initial request yesterday.   Advised patient of rx refill policy.   Talbot Grumbling, RN

## 2020-04-10 ENCOUNTER — Ambulatory Visit (INDEPENDENT_AMBULATORY_CARE_PROVIDER_SITE_OTHER): Payer: Medicare HMO | Admitting: Cardiology

## 2020-04-10 ENCOUNTER — Encounter: Payer: Self-pay | Admitting: Cardiology

## 2020-04-10 ENCOUNTER — Other Ambulatory Visit: Payer: Self-pay

## 2020-04-10 VITALS — BP 140/60 | HR 84 | Ht 65.0 in | Wt 180.0 lb

## 2020-04-10 DIAGNOSIS — I351 Nonrheumatic aortic (valve) insufficiency: Secondary | ICD-10-CM

## 2020-04-10 DIAGNOSIS — I10 Essential (primary) hypertension: Secondary | ICD-10-CM

## 2020-04-10 DIAGNOSIS — Z87898 Personal history of other specified conditions: Secondary | ICD-10-CM | POA: Diagnosis not present

## 2020-04-10 NOTE — Patient Instructions (Signed)
Medication Instructions:  Your physician recommends that you continue on your current medications as directed. Please refer to the Current Medication list given to you today.  *If you need a refill on your cardiac medications before your next appointment, please call your pharmacy*   Lab Work: Requested lab work from [facility] If you have labs (blood work) drawn today and your tests are completely normal, you will receive your results only by: Marland Kitchen MyChart Message (if you have MyChart) OR . A paper copy in the mail If you have any lab test that is abnormal or we need to change your treatment, we will call you to review the results.   Testing/Procedures: Your physician has requested that you have an echocardiogram. Echocardiography is a painless test that uses sound waves to create images of your heart. It provides your doctor with information about the size and shape of your heart and how well your heart's chambers and valves are working. This procedure takes approximately one hour. There are no restrictions for this procedure.     Follow-Up: At [facility], you and your health needs are our priority.  As part of our continuing mission to provide you with exceptional heart care, we have created designated Provider Care Teams.  These Care Teams include your primary Cardiologist (physician) and Advanced Practice Providers (APPs -  Physician Assistants and Nurse Practitioners) who all work together to provide you with the care you need, when you need it.  We recommend signing up for the patient portal called "MyChart".  Sign up information is provided on this After Visit Summary.  MyChart is used to connect with patients for Virtual Visits (Telemedicine).  Patients are able to view lab/test results, encounter notes, upcoming appointments, etc.  Non-urgent messages can be sent to your provider as well.   To learn more about what you can do with MyChart, go to [email or web address].     Your next appointment:   12 month(s)  The format for your next appointment:   In Person  Provider:   Carlyle Dolly, MD   Other Instructions   Echocardiogram An echocardiogram is a test that uses sound waves (ultrasound) to produce images of the heart. Images from an echocardiogram can provide important information about:  Heart size and shape.  The size and thickness and movement of your heart's walls.  Heart muscle function and strength.  Heart valve function or if you have stenosis. Stenosis is when the heart valves are too narrow.  If blood is flowing backward through the heart valves (regurgitation).  A tumor or infectious growth around the heart valves.  Areas of heart muscle that are not working well because of poor blood flow or injury from a heart attack.  Aneurysm detection. An aneurysm is a weak or damaged part of an artery wall. The wall bulges out from the normal force of blood pumping through the body. Tell a health care provider about:  Any allergies you have.  All medicines you are taking, including vitamins, herbs, eye drops, creams, and over-the-counter medicines.  Any blood disorders you have.  Any surgeries you have had.  Any medical conditions you have.  Whether you are pregnant or may be pregnant. What are the risks? Generally, this is a safe test. However, problems may occur, including an allergic reaction to dye (contrast) that may be used during the test. What happens before the test? No specific preparation is needed. You may eat and drink normally. What happens during the test?  You will take off your clothes from the waist up and put on a hospital gown.  Electrodes or electrocardiogram (ECG)patches may be placed on your chest. The electrodes or patches are then connected to a device that monitors your heart rate and rhythm.  You will lie down on a table for an ultrasound exam. A gel will be applied to your chest to help sound  waves pass through your skin.  A handheld device, called a transducer, will be pressed against your chest and moved over your heart. The transducer produces sound waves that travel to your heart and bounce back (or "echo" back) to the transducer. These sound waves will be captured in real-time and changed into images of your heart that can be viewed on a video monitor. The images will be recorded on a computer and reviewed by your health care provider.  You may be asked to change positions or hold your breath for a short time. This makes it easier to get different views or better views of your heart.  In some cases, you may receive contrast through an IV in one of your veins. This can improve the quality of the pictures from your heart. The procedure may vary among health care providers and hospitals.   What can I expect after the test? You may return to your normal, everyday life, including diet, activities, and medicines, unless your health care provider tells you not to do that. Follow these instructions at home:  It is up to you to get the results of your test. Ask your health care provider, or the department that is doing the test, when your results will be ready.  Keep all follow-up visits. This is important. Summary  An echocardiogram is a test that uses sound waves (ultrasound) to produce images of the heart.  Images from an echocardiogram can provide important information about the size and shape of your heart, heart muscle function, heart valve function, and other possible heart problems.  You do not need to do anything to prepare before this test. You may eat and drink normally.  After the echocardiogram is completed, you may return to your normal, everyday life, unless your health care provider tells you not to do that. This information is not intended to replace advice given to you by your health care provider. Make sure you discuss any questions you have with your health care  provider. Document Revised: 08/28/2019 Document Reviewed: 08/28/2019 Elsevier Patient Education  2021 [employer].

## 2020-04-10 NOTE — Progress Notes (Signed)
Clinical Summary Ms. Onnen is a 60 y.o.female  1. Syncope 10/2017 echo LVEF 60-65%, grade II diastoilc dysfunction, - 10/2017 holter: PACs, short runs atach. No sustained arrhythmias  - recent episodes started in 10/2017 - last episode 3 weeks ago at home watching tv. Mild lightheadedness before, next thing she knew her husband was waking her up - her sister recalls an episode while watching tv while and talking to family. No prodrome, just slumped down in chair. Out for 5-6 minutes.  - has had some prior episodes with falls.  - episodes have not resolved since completing treatment for severe pneumonia in November   02/2018 event monitor was benign  - no recent syncope or presyncope   2. Diastolic dysfunction - 56/2130 echo LVEF 60-65%, grade II diastolic dysfunction -- no recent edema. No SOB/DOE   3. Aortic regurgitation - 10/2017 moderate AI - denies any symptoms.     4. HTN - has not taken meds yet today. - has had some orthostaitc changes in clinic, avoiding being too aggressive with bp  Past Medical History:  Diagnosis Date  . Anxiety   . Asthma    every now and then.  Wheezing and coughing  . Cancer ([place]) 1997   left lung  . Chronic abdominal pain   . Chronic back pain   . Chronic joint pain   . Depression   . HLD (hyperlipidemia)   . Palpitations 01/2013   chronic, intermittent  . Shortness of breath dyspnea    with exertion     Allergies  Allergen Reactions  . Promethazine Hcl Nausea And Vomiting and Other (See Comments)    "Makes my head hurt really bad too"     Current Outpatient Medications  Medication Sig Dispense Refill  . acyclovir (ZOVIRAX) 400 MG tablet Take 1 tablet by mouth twice daily 180 tablet 0  . albuterol (PROVENTIL) (2.5 MG/3ML) 0.083% nebulizer solution USE 1 VIAL IN NEBULIZER EVERY 6 HOURS AS NEEDED FOR WHEEZING AND FOR SHORTNESS OF BREATH 75 mL 0  . amitriptyline (ELAVIL) 150 MG tablet Take 1 tablet by mouth once  daily 90 tablet 0  . amLODipine (NORVASC) 10 MG tablet Take 1 tablet by mouth once daily 90 tablet 0  . atorvastatin (LIPITOR) 40 MG tablet Take 1 tablet by mouth once daily 90 tablet 0  . budesonide-formoterol (SYMBICORT) 160-4.5 MCG/ACT inhaler Inhale 2 puffs into the lungs 2 (two) times daily. (Patient taking differently: Inhale 2 puffs into the lungs 2 (two) times daily as needed (respiratory issues.). ) 1 Inhaler 3  . celecoxib (CELEBREX) 200 MG capsule Take 1 capsule (200 mg total) by mouth daily. 60 capsule 0  . clotrimazole (CLOTRIMAZOLE ANTI-FUNGAL) 1 % cream Apply 1 application topically 2 (two) times daily. 30 g 0  . DEXILANT 60 MG capsule TAKE 1 CAPSULE BY MOUTH ONCE DAILY WITH BREAKFAST 90 capsule 1  . dicyclomine (BENTYL) 10 MG capsule Take 1 capsule (10 mg total) by mouth as needed for spasms (30 mins before meals and at bedtime as needed for diarrhea and abdominal cramps, no more than four per day. hold for constipation). 60 capsule 1  . DULoxetine (CYMBALTA) 60 MG capsule Take 1 capsule by mouth twice daily 120 capsule 0  . hydrocortisone 1 % ointment Apply 1 application topically 2 (two) times daily. (Patient taking differently: Apply 1 application topically as needed. ) 30 g 0  . linaclotide (LINZESS) 290 MCG CAPS capsule TAKE 1 CAPSULE BY MOUTH ONCE  DAILY BEFORE BREAKFAST 30 capsule 11  . nystatin (MYCOSTATIN) 100000 UNIT/ML suspension Take 5 mLs (500,000 Units total) by mouth 4 (four) times daily. 60 mL 0  . pregabalin (LYRICA) 100 MG capsule TAKE 1 CAPSULE BY MOUTH THREE TIMES DAILY 90 capsule 0   No current facility-administered medications for this visit.     Past Surgical History:  Procedure Laterality Date  . ABDOMINAL HYSTERECTOMY    . ABDOMINAL SURGERY     ovarian cyst removal  . BIOPSY N/A 11/22/2013   Procedure: GASTRIC BIOPSY;  Surgeon: Daneil Dolin, MD;  Location: [facility];  Service: Endoscopy;  Laterality: N/A;  . BLADDER SURGERY  suspension x4   x 4  .  CARPAL TUNNEL RELEASE Right 07/11/2015   Procedure: RIGHT CARPAL TUNNEL RELEASE;  Surgeon: Jessy Oto, MD;  Location: [facility];  Service: Orthopedics;  Laterality: Right;  . CHOLECYSTECTOMY N/A 04/05/2014   Procedure: LAPAROSCOPIC CHOLECYSTECTOMY;  Surgeon: Aviva Signs Md, MD;  Location: [facility];  Service: General;  Laterality: N/A;  . COCCYGECTOMY N/A 04/11/2015   Procedure: COCCYGECTOMY WITH OSTEOTOMY THROUGH AREA OF DEFORMITY;  Surgeon: Jessy Oto, MD;  Location: [facility];  Service: Orthopedics;  Laterality: N/A;  . COLONOSCOPY    . COLONOSCOPY WITH PROPOFOL N/A 11/22/2013   Dr. Gala Romney: Grade 3 and 4/internal hemorrhoids?"likely source of hematochezia Normal colonoscopy otherwise.   Marland Kitchen COLONOSCOPY WITH PROPOFOL N/A 09/15/2017   Dr. Emerson Monte: Hemorrhoids, next colonoscopy 10 years  . ESOPHAGOGASTRODUODENOSCOPY (EGD) WITH PROPOFOL N/A 11/22/2013   Dr. Gala Romney: Incomplete Schatzki's ring dilated and disrupted as described above.  Small hiatal hernia. Focally abnormal gastric mucosa of uncertain significance status post biopsy, reactive gastropathy, negative H.pylori  . ESOPHAGOGASTRODUODENOSCOPY (EGD) WITH PROPOFOL N/A 08/04/2015   Dr. Gala Romney: mild Schatzki's ring s/p dilation, small hiatal hernia   . ESOPHAGOGASTRODUODENOSCOPY (EGD) WITH PROPOFOL N/A 03/17/2017   Mild Schatki's ring s/p dilation, small hiatal hernia, otherwise normal  . ESOPHAGOGASTRODUODENOSCOPY (EGD) WITH PROPOFOL N/A 09/18/2018   Dr. Gala Romney: Schatzki ring status post dilation and disruption.  Hiatal hernia.  Marland Kitchen ESOPHAGOGASTRODUODENOSCOPY (EGD) WITH PROPOFOL N/A 12/20/2019   Procedure: ESOPHAGOGASTRODUODENOSCOPY (EGD) WITH PROPOFOL;  Surgeon: Daneil Dolin, MD;  [facility];  Service: Endoscopy;  Laterality: N/A;  7:30am  . EXCISION VAGINAL CYST Left 06/20/2012   Procedure: EXCISION LEFT LABIAL CYST;  Surgeon: Jonnie Kind, MD;  Location: [facility];  Service: Gynecology;  Laterality: Left;  . LOBECTOMY Left    . LUNG CANCER SURGERY  1997   age 83, resection only  . MALONEY DILATION N/A 11/22/2013   Procedure: [name] DILATION;  Surgeon: Daneil Dolin, MD;  Location: [facility];  Service: Endoscopy;  Laterality: N/A;  56  . MALONEY DILATION N/A 08/04/2015   Procedure: [name] DILATION;  Surgeon: Daneil Dolin, MD;  [facility];  Service: Endoscopy;  Laterality: N/A;  [name] DILATION N/A 03/17/2017   Procedure: [name] DILATION;  Surgeon: Daneil Dolin, MD;  [facility];  Service: Endoscopy;  Laterality: N/A;  [name] DILATION N/A 09/18/2018   Procedure: [name] DILATION;  Surgeon: Daneil Dolin, MD;  [facility];  Service: Endoscopy;  Laterality: N/A;  [name] DILATION N/A 12/20/2019   Procedure: [name] DILATION;  Surgeon: Daneil Dolin, MD;  [facility];  Service: Endoscopy;  Laterality: N/A;  . MASS EXCISION N/A 07/23/2014   Procedure: EXCISIONAL BIOPSY NODULE LEFT PARACOCCYGEAL AREA;  Surgeon: Jessy Oto,  MD;  Location: [facility];  Service: Orthopedics;  Laterality: N/A;  . MASS EXCISION Left 10/10/2015   Procedure: EXCISIONAL BIOPSY OF LEFT DORSORADIAL FOREARM MASS LIPOMA;  Surgeon: Jessy Oto, MD;  Location: [facility];  Service: Orthopedics;  Laterality: Left;  . TUBAL LIGATION       Allergies  Allergen Reactions  . Promethazine Hcl Nausea And Vomiting and Other (See Comments)    "Makes my head hurt really bad too"      Family History  Problem Relation Age of Onset  . Diabetes Other   . Heart disease Other        has Psychologist, forensic  . Hypertension Mother   . Kidney disease Mother   . Hypertension Father   . Kidney disease Father   . Heart disease Father   . Cancer Father        leukemia  . Hypertension Sister   . Hypertension Sister   . Colon cancer Neg Hx      Social History Ms. Alamillo reports that she quit smoking about 25 years ago. Her smoking use included cigarettes. She has a 37.50 pack-year smoking  history. She has never used smokeless tobacco. Ms. Pennie reports no history of alcohol use.   Review of Systems CONSTITUTIONAL: No weight loss, fever, chills, weakness or fatigue.  HEENT: Eyes: No visual loss, blurred vision, double vision or yellow sclerae.No hearing loss, sneezing, congestion, runny nose or sore throat.  SKIN: No rash or itching.  CARDIOVASCULAR: per hpi RESPIRATORY: No shortness of breath, cough or sputum.  GASTROINTESTINAL: No anorexia, nausea, vomiting or diarrhea. No abdominal pain or blood.  GENITOURINARY: No burning on urination, no polyuria NEUROLOGICAL: No headache, dizziness, syncope, paralysis, ataxia, numbness or tingling in the extremities. No change in bowel or bladder control.  MUSCULOSKELETAL: No muscle, back pain, joint pain or stiffness.  LYMPHATICS: No enlarged nodes. No history of splenectomy.  PSYCHIATRIC: No history of depression or anxiety.  ENDOCRINOLOGIC: No reports of sweating, cold or heat intolerance. No polyuria or polydipsia.  Marland Kitchen   Physical Examination Today's Vitals   04/10/20 0913  BP: 140/60  Pulse: 84  SpO2: 95%  Weight: 180 lb (81.6 kg)  Height: 5\' 5"  (1.651 m)   Body mass index is 29.95 kg/m.  Gen: resting comfortably, no acute distress HEENT: no scleral icterus, pupils equal round and reactive, no palptable cervical adenopathy,  CV: RRR, no m/r/g, no jvd Resp: Clear to auscultation bilaterally GI: abdomen is soft, non-tender, non-distended, normal bowel sounds, no hepatosplenomegaly MSK: extremities are warm, no edema.  Skin: warm, no rash Neuro:  no focal deficits Psych: appropriate affect   Diagnostic Studies   02/2018 heart monitor  30 day event monitor. Data only available from 64% of planned monitored time  Min HR 64, Max HR 135, Avg HR 90  No symptoms reported  Tracings show sinus rhythm, no significant arrhythmias       Assessment and Plan  1. Syncope - unclear etiology - no evidence of cardiac  etiology by echo or monitor -if recurrent episodes would refer to neuro - EKG today shows NSR  2. Aortic regurgitation - will repeat echo, was moderate by 2019 echo  3. HTN -mildly elevated today, has not taken meds yet - continue current meds  F/u 1year     Arnoldo Lenis, M.D.

## 2020-04-15 ENCOUNTER — Other Ambulatory Visit: Payer: Self-pay | Admitting: Family Medicine

## 2020-04-22 NOTE — Telephone Encounter (Signed)
PCP paged to address. Christen Bame, CMA

## 2020-04-23 ENCOUNTER — Ambulatory Visit: Payer: Self-pay

## 2020-04-23 ENCOUNTER — Ambulatory Visit (INDEPENDENT_AMBULATORY_CARE_PROVIDER_SITE_OTHER): Payer: Medicare HMO | Admitting: Surgery

## 2020-04-23 ENCOUNTER — Telehealth: Payer: Self-pay | Admitting: *Deleted

## 2020-04-23 ENCOUNTER — Encounter: Payer: Self-pay | Admitting: Surgery

## 2020-04-23 VITALS — Ht 65.0 in | Wt 180.0 lb

## 2020-04-23 DIAGNOSIS — G8929 Other chronic pain: Secondary | ICD-10-CM

## 2020-04-23 DIAGNOSIS — M533 Sacrococcygeal disorders, not elsewhere classified: Secondary | ICD-10-CM

## 2020-04-23 DIAGNOSIS — M47812 Spondylosis without myelopathy or radiculopathy, cervical region: Secondary | ICD-10-CM

## 2020-04-23 DIAGNOSIS — M545 Low back pain, unspecified: Secondary | ICD-10-CM | POA: Diagnosis not present

## 2020-04-23 DIAGNOSIS — M542 Cervicalgia: Secondary | ICD-10-CM | POA: Diagnosis not present

## 2020-04-23 MED ORDER — METHYLPREDNISOLONE ACETATE 40 MG/ML IJ SUSP
40.0000 mg | Freq: Once | INTRAMUSCULAR | Status: DC
Start: 2020-04-23 — End: 2020-11-11

## 2020-04-23 MED ORDER — KETOROLAC TROMETHAMINE 30 MG/ML IJ SOLN: 30.0000 mg | Freq: Once | INTRAMUSCULAR | Status: AC

## 2020-04-23 NOTE — Progress Notes (Signed)
Patient was sent for bilateral SI injections but she changed her mind and wants to see whether the IM steroid she was given today will help.  If it doesn't, she will call and we'll do SI injections.

## 2020-04-23 NOTE — Telephone Encounter (Signed)
Called pt and advised her that since she refused the injection with Dr. Junius Roads that Benjiman Core is recommending her to change her appt to Dr. Louanne Skye in 4 weeks to follow up. Pt states she will call to reschedule with Nitka.

## 2020-04-23 NOTE — Progress Notes (Signed)
Office Visit Note   Patient: [name]           Date of Birth: 1960/05/14           MRN: [id number] Visit Date: 04/23/2020              Requested by: Shary Key, DO [place],  [place] [address] PCP: Shary Key, DO   Assessment & Plan: Visit Diagnoses:  1. Neck pain   2. Chronic low back pain, unspecified back pain laterality, unspecified whether sciatica present   3. Chronic SI joint pain   4. Spondylosis without myelopathy or radiculopathy, cervical region     Plan: Since patient states that she just had a prednisone taper recently from her primary care physician for issue with her eye and I want to give her another today.  She was given a Depo-Medrol 40 mg and Toradol 30 mg IM injection today by my assistant.  I asked [name] if he would perform bilateral ultrasound-guided SI joint Marcaine/Depo-Medrol injections and he stated that he could do that this morning.  Patient will follow with me in 4 weeks for recheck.  Advised her to pay close attention to how she feels after [name] injections.  If she does get good relief she may be a candidate for radiofrequency SI joint ablation in the future.  If no improvement with the injections may need further work-up of her lumbar spine with MRI.  All questions answered.   ADDENDUM: After patient had left the office I was advised by [name] that patient changed her mind and decided not to have the bilateral ultrasound-guided SI joint injections today.  I changed her follow-up appointment to come back and see [name] instead of me in 4 weeks and he can discuss next course of treatment.  Follow-Up Instructions: Return in about 4 weeks (around 05/21/2020) for [place].   Orders:  Orders Placed This Encounter  Procedures  . XR Cervical Spine 2 or 3 views  . XR Lumbar Spine 2-3 Views  . US Guided Needle Placement - No Linked Charges   Meds ordered this encounter  Medications  . methylPREDNISolone  acetate (DEPO-MEDROL) injection 40 mg  . ketorolac (TORADOL) 30 MG/ML injection 30 mg      Procedures: No procedures performed   Clinical Data: No additional findings.   Subjective: Chief Complaint  Patient presents with  . Neck - Pain  . Lower Back - Pain    HPI 60 year old white female comes in today with complaints of neck pain and low back pain.  Status post coccygectomy April 11, 2015 and status post L3-4 fusion December 2016 both done by [name].  Patient comes in with neck pain x1 month.  Localized to the left side.  No injury.  No previous problems for onset.  Denies upper extremity radicular symptoms.  States that when she turns her head to the right she has pain in the left side of her neck into the left trapezius muscle.  She also says that she gets some tingling in her tongue.  States that she has been treated for Bell's palsy in the past by her primary care physician.  She is not discussed the tongue issues with her PCP.  Increased low back pain x3 months.  States that pain can be constant and aggravated with activity.  No lower extremity radicular symptoms.  Localizes pain to around the bilateral SI joints and bilateral lateral  hips.    Review of Systems No current cardiopulmonary GI GU issues  Objective: Vital Signs: Ht 5\' 5"  (1.651 m)   Wt 180 lb (81.6 kg)   BMI 29.95 kg/m   Physical Exam HENT:     Head: Normocephalic.  Eyes:     Extraocular Movements: Extraocular movements intact.  Musculoskeletal:     Comments: Cervical spine limited range of motion.  She has moderate to marked left greater than right brachial plexus tenderness.  Moderate to marked left trapezius and scapular tenderness/spasm.  Bilateral shoulder exam unremarkable.  Gait is normal.  Marked bilateral SI joint tenderness.  Negative sciatic notch tenderness.  Mild tenderness over the bilateral hip greater trochanter bursa.  Negative logroll bilateral hips.  Negative straight leg raise.   Neurologically intact.  Neurological:     Mental Status: She is alert and oriented to person, place, and time.  Psychiatric:        Mood and Affect: Mood normal.     Ortho Exam  Specialty Comments:  No specialty comments available.  Imaging: No results found.   PMFS History: Patient Active Problem List   Diagnosis Date Noted  . Acute conjunctivitis of both eyes 03/25/2020  . Radial tunnel syndrome 02/06/2019  . Sacroiliac inflammation ([place]) 05/23/2018  . Poor sleep hygiene 05/23/2018  . Chronic obstructive pulmonary disease with (acute) exacerbation ([place]) 05/23/2018  . Orthostatic hypotension 05/23/2018  . Syncope and collapse 05/23/2018  . Elevated fasting glucose 05/23/2018  . Nocturia more than twice per night 05/23/2018  . Snoring 05/23/2018  . Hemorrhoid 02/15/2018  . Abdominal distention 02/15/2018  . Anemia 01/09/2018  . Pain of upper abdomen 01/09/2018  . Healthcare maintenance 12/30/2017  . Antibiotic-associated diarrhea 12/22/2017  . Vagina itching 12/22/2017  . Sepsis ([place])   . Respiratory distress   . Partial small bowel obstruction ([place]) 12/11/2017  . COPD with acute exacerbation ([place]) 12/11/2017  . Hypophosphatemia 12/11/2017  . Sepsis due to undetermined organism ([place]) 12/10/2017  . AKI (acute kidney injury) ([place]) 12/10/2017  . Hyperbilirubinemia 12/10/2017  . Aortic regurgitation 10/25/2017  . Diastolic dysfunction 32/44/0102  . Dysphagia 01/20/2017  . Left hip pain 01/19/2017  . Right hand pain 01/19/2017  . Diarrhea 10/11/2016  . Right hip pain 06/07/2016  . Dysuria 04/14/2016  . Malaise and fatigue 04/14/2016  . Breast pain, left 11/06/2015  . Mass of left forearm 10/10/2015    Class: Chronic  . Dysphagia, oropharyngeal   . Gastroesophageal reflux disease without esophagitis   . Carpal tunnel syndrome, right 07/11/2015    Class: Chronic  . Prediabetes 07/09/2015  . Lumbar stenosis with neurogenic claudication 01/05/2015  .  Spondylolisthesis of lumbar region 01/03/2015    Class: Chronic  . Soft tissue mass 07/23/2014    Class: Acute  . Coccygodynia 07/23/2014    Class: Acute  . Constipation 02/15/2014  . Dysphagia, pharyngoesophageal phase   . Elevated alkaline phosphatase level 11/06/2013  . Rectal bleeding 11/06/2013  . Obesity, unspecified 02/15/2013  . Genital herpes 10/12/2012  . HSV (herpes simplex virus) anogenital infection 09/29/2012  . UNSPECIFIED SLEEP APNEA 12/04/2008  . ADENOCARCINOMA, LUNG 10/09/2008  . Osteoarthritis of multiple joints 07/13/2006  . HYPERCHOLESTEROLEMIA 03/17/2006  . DEPRESSION, MAJOR, RECURRENT 03/17/2006  . HYPERTENSION, BENIGN SYSTEMIC 03/17/2006  . GASTROESOPHAGEAL REFLUX, NO ESOPHAGITIS 03/17/2006  . Insomnia due to medical condition 03/17/2006   Past Medical History:  Diagnosis Date  . Anxiety   . Asthma    every now and then.  Wheezing  and coughing  . Cancer ([place]) 1997   left lung  . Chronic abdominal pain   . Chronic back pain   . Chronic joint pain   . Depression   . HLD (hyperlipidemia)   . Palpitations 01/2013   chronic, intermittent  . Shortness of breath dyspnea    with exertion    Family History  Problem Relation Age of Onset  . Diabetes Other   . Heart disease Other        has Psychologist, forensic  . Hypertension Mother   . Kidney disease Mother   . Hypertension Father   . Kidney disease Father   . Heart disease Father   . Cancer Father        leukemia  . Hypertension Sister   . Hypertension Sister   . Colon cancer Neg Hx     Past Surgical History:  Procedure Laterality Date  . ABDOMINAL HYSTERECTOMY    . ABDOMINAL SURGERY     ovarian cyst removal  . BIOPSY N/A 11/22/2013   Procedure: GASTRIC BIOPSY;  Surgeon: Daneil Dolin, MD;  Location: [facility];  Service: Endoscopy;  Laterality: N/A;  . BLADDER SURGERY  suspension x4   x 4  . CARPAL TUNNEL RELEASE Right 07/11/2015   Procedure: RIGHT CARPAL TUNNEL RELEASE;  Surgeon: Jessy Oto, MD;   Location: [facility];  Service: Orthopedics;  Laterality: Right;  . CHOLECYSTECTOMY N/A 04/05/2014   Procedure: LAPAROSCOPIC CHOLECYSTECTOMY;  Surgeon: Aviva Signs Md, MD;  Location: [facility];  Service: General;  Laterality: N/A;  . COCCYGECTOMY N/A 04/11/2015   Procedure: COCCYGECTOMY WITH OSTEOTOMY THROUGH AREA OF DEFORMITY;  Surgeon: Jessy Oto, MD;  Location: [facility];  Service: Orthopedics;  Laterality: N/A;  . COLONOSCOPY    . COLONOSCOPY WITH PROPOFOL N/A 11/22/2013   Dr. Gala Romney: Grade 3 and 4/internal hemorrhoids?"likely source of hematochezia Normal colonoscopy otherwise.   Marland Kitchen COLONOSCOPY WITH PROPOFOL N/A 09/15/2017   Dr. Emerson Monte: Hemorrhoids, next colonoscopy 10 years  . ESOPHAGOGASTRODUODENOSCOPY (EGD) WITH PROPOFOL N/A 11/22/2013   Dr. Gala Romney: Incomplete Schatzki's ring dilated and disrupted as described above.  Small hiatal hernia. Focally abnormal gastric mucosa of uncertain significance status post biopsy, reactive gastropathy, negative H.pylori  . ESOPHAGOGASTRODUODENOSCOPY (EGD) WITH PROPOFOL N/A 08/04/2015   Dr. Gala Romney: mild Schatzki's ring s/p dilation, small hiatal hernia   . ESOPHAGOGASTRODUODENOSCOPY (EGD) WITH PROPOFOL N/A 03/17/2017   Mild Schatki's ring s/p dilation, small hiatal hernia, otherwise normal  . ESOPHAGOGASTRODUODENOSCOPY (EGD) WITH PROPOFOL N/A 09/18/2018   Dr. Gala Romney: Schatzki ring status post dilation and disruption.  Hiatal hernia.  Marland Kitchen ESOPHAGOGASTRODUODENOSCOPY (EGD) WITH PROPOFOL N/A 12/20/2019   Procedure: ESOPHAGOGASTRODUODENOSCOPY (EGD) WITH PROPOFOL;  Surgeon: Daneil Dolin, MD;  [facility];  Service: Endoscopy;  Laterality: N/A;  7:30am  . EXCISION VAGINAL CYST Left 06/20/2012   Procedure: EXCISION LEFT LABIAL CYST;  Surgeon: Jonnie Kind, MD;  Location: [facility];  Service: Gynecology;  Laterality: Left;  . LOBECTOMY Left   . LUNG CANCER SURGERY  1997   age 85, resection only  . MALONEY DILATION N/A 11/22/2013   Procedure: [name]  DILATION;  Surgeon: Daneil Dolin, MD;  Location: [facility];  Service: Endoscopy;  Laterality: N/A;  56  . MALONEY DILATION N/A 08/04/2015   Procedure: [name] DILATION;  Surgeon: Daneil Dolin, MD;  [facility];  Service: Endoscopy;  Laterality: N/A;  . MALONEY DILATION N/A 03/17/2017   Procedure: [name] DILATION;  Surgeon: Manus Rudd  M, MD;  [facility];  Service: Endoscopy;  Laterality: N/A;  [name] DILATION N/A 09/18/2018   Procedure: [name] DILATION;  Surgeon: Daneil Dolin, MD;  [facility];  Service: Endoscopy;  Laterality: N/A;  [name] DILATION N/A 12/20/2019   Procedure: [name] DILATION;  Surgeon: Daneil Dolin, MD;  [facility];  Service: Endoscopy;  Laterality: N/A;  . MASS EXCISION N/A 07/23/2014   Procedure: EXCISIONAL BIOPSY NODULE LEFT PARACOCCYGEAL AREA;  Surgeon: Jessy Oto, MD;  Location: [facility];  Service: Orthopedics;  Laterality: N/A;  . MASS EXCISION Left 10/10/2015   Procedure: EXCISIONAL BIOPSY OF LEFT DORSORADIAL FOREARM MASS LIPOMA;  Surgeon: Jessy Oto, MD;  Location: [facility];  Service: Orthopedics;  Laterality: Left;  . TUBAL LIGATION     Social History   Occupational History  . Not on file  Tobacco Use  . Smoking status: Former Smoker    Packs/day: 1.50    Years: 25.00    Pack years: 37.50    Types: Cigarettes    Quit date: 04/07/1995    Years since quitting: 25.0  . Smokeless tobacco: Never Used  Vaping Use  . Vaping Use: Never used  Substance and Sexual Activity  . Alcohol use: No  . Drug use: No  . Sexual activity: Not Currently    Birth control/protection: Surgical

## 2020-04-24 ENCOUNTER — Telehealth: Payer: Self-pay | Admitting: Specialist

## 2020-04-24 NOTE — Telephone Encounter (Signed)
Per Jeneen Rinks' office note he wanted her to follow up with Dr. Louanne Skye in 4 weeks, nothing was said about ASAP

## 2020-04-24 NOTE — Telephone Encounter (Signed)
Pt states that Ricard Dillon told her she needs to get in with Dr.NItka asap so is there anyway she can be worked in before may 5?

## 2020-04-26 NOTE — Telephone Encounter (Signed)
It has been called in. Sorry for the delay, I was having issues sending electronically. Thank you!

## 2020-04-29 ENCOUNTER — Other Ambulatory Visit: Payer: Self-pay | Admitting: Family Medicine

## 2020-04-29 ENCOUNTER — Other Ambulatory Visit: Payer: Self-pay

## 2020-04-29 ENCOUNTER — Ambulatory Visit (HOSPITAL_COMMUNITY)
Admission: RE | Admit: 2020-04-29 | Discharge: 2020-04-29 | Disposition: A | Discharge status: Home / Self Care | Payer: Medicare HMO | Source: Ambulatory Visit | Attending: Cardiology | Admitting: Cardiology

## 2020-04-29 DIAGNOSIS — I351 Nonrheumatic aortic (valve) insufficiency: Secondary | ICD-10-CM | POA: Diagnosis not present

## 2020-04-29 DIAGNOSIS — I10 Essential (primary) hypertension: Secondary | ICD-10-CM

## 2020-04-29 LAB — ECHOCARDIOGRAM COMPLETE
AR max vel: 2.78 cm2
AV Area VTI: 2.78 cm2
AV Area mean vel: 2.73 cm2
AV Mean grad: 5.5 mmHg
AV Peak grad: 12.5 mmHg
Ao pk vel: 1.77 m/s
Area-P 1/2: 1.81 cm2
P 1/2 time: 438 msec
S' Lateral: 2.8 cm

## 2020-04-29 NOTE — Progress Notes (Signed)
*  PRELIMINARY RESULTS* Echocardiogram 2D Echocardiogram has been performed.  [name] 04/29/2020, 12:07 PM

## 2020-05-02 ENCOUNTER — Telehealth: Payer: Self-pay | Admitting: *Deleted

## 2020-05-02 NOTE — Telephone Encounter (Signed)
Pt requesting echo results done 4/12

## 2020-05-05 NOTE — Telephone Encounter (Signed)
Echo shows heart pumping function remains normal. Her aortic valve leak has progresed to moderate, still to to a degree of concern and just something to continue to monitor with echo every few years    Zandra Abts MD

## 2020-05-05 NOTE — Telephone Encounter (Signed)
Pt voiced understanding

## 2020-05-05 NOTE — Telephone Encounter (Signed)
Just resulted  J Asher Torpey MD

## 2020-05-09 ENCOUNTER — Other Ambulatory Visit: Payer: Self-pay | Admitting: Family Medicine

## 2020-05-09 DIAGNOSIS — I10 Essential (primary) hypertension: Secondary | ICD-10-CM

## 2020-05-12 ENCOUNTER — Other Ambulatory Visit: Payer: Self-pay | Admitting: Family Medicine

## 2020-05-23 ENCOUNTER — Ambulatory Visit: Payer: Medicare HMO | Admitting: Specialist

## 2020-05-30 ENCOUNTER — Other Ambulatory Visit: Payer: Self-pay | Admitting: Family Medicine

## 2020-05-30 DIAGNOSIS — M48062 Spinal stenosis, lumbar region with neurogenic claudication: Secondary | ICD-10-CM

## 2020-06-09 ENCOUNTER — Other Ambulatory Visit: Payer: Self-pay | Admitting: Family Medicine

## 2020-06-18 ENCOUNTER — Other Ambulatory Visit: Payer: Self-pay | Admitting: Family Medicine

## 2020-06-18 DIAGNOSIS — E7800 Pure hypercholesterolemia, unspecified: Secondary | ICD-10-CM

## 2020-06-20 ENCOUNTER — Other Ambulatory Visit: Payer: Self-pay

## 2020-06-20 ENCOUNTER — Encounter: Payer: Self-pay | Admitting: Specialist

## 2020-06-20 ENCOUNTER — Ambulatory Visit (INDEPENDENT_AMBULATORY_CARE_PROVIDER_SITE_OTHER): Payer: Medicare HMO

## 2020-06-20 ENCOUNTER — Ambulatory Visit: Payer: Self-pay

## 2020-06-20 ENCOUNTER — Ambulatory Visit (INDEPENDENT_AMBULATORY_CARE_PROVIDER_SITE_OTHER): Payer: Medicare HMO | Admitting: Specialist

## 2020-06-20 VITALS — BP 145/75 | HR 90 | Ht 65.0 in | Wt 180.0 lb

## 2020-06-20 DIAGNOSIS — M47812 Spondylosis without myelopathy or radiculopathy, cervical region: Secondary | ICD-10-CM

## 2020-06-20 DIAGNOSIS — G8929 Other chronic pain: Secondary | ICD-10-CM | POA: Diagnosis not present

## 2020-06-20 DIAGNOSIS — M533 Sacrococcygeal disorders, not elsewhere classified: Secondary | ICD-10-CM

## 2020-06-20 DIAGNOSIS — R2232 Localized swelling, mass and lump, left upper limb: Secondary | ICD-10-CM

## 2020-06-20 DIAGNOSIS — M545 Low back pain, unspecified: Secondary | ICD-10-CM | POA: Diagnosis not present

## 2020-06-20 MED ORDER — BIOFREEZE 4 % EX GEL: CUTANEOUS | 3 refills | Status: DC

## 2020-06-20 MED ORDER — DICLOFENAC SODIUM 1 % EX GEL: 2.0000 g | Freq: Four times a day (QID) | CUTANEOUS | 3 refills | Status: DC
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
## 2020-06-20 NOTE — Patient Instructions (Addendum)
Avoid overhead lifting and overhead use of the arms. Do not lift greater than 5 lbs. Adjust head rest in vehicle to prevent hyperextension if rear ended. Take extra precautions to avoid falling. May need to move TV downwards to decrease pain due to staring upward this stresses the joints on the posterior neck. Voltaren gel applied to the left posterior neck 3-4 time per day. Biofreeze roll on application of menthal is helpful Will order for therapy for the cervical spondylosis at [facility]. Ultra sound of the left inner elbow

## 2020-06-30 ENCOUNTER — Telehealth: Payer: Self-pay | Admitting: Specialist

## 2020-06-30 NOTE — Telephone Encounter (Signed)
I will have to ask Dr. Louanne Skye about this when he comes back on Wednesday

## 2020-06-30 NOTE — Telephone Encounter (Signed)
There is no order placed

## 2020-06-30 NOTE — Telephone Encounter (Signed)
Pt states she nees ultrasound and nobody has called.  CB [phone]

## 2020-07-02 ENCOUNTER — Other Ambulatory Visit: Payer: Self-pay | Admitting: Radiology

## 2020-07-02 ENCOUNTER — Ambulatory Visit: Payer: Self-pay

## 2020-07-02 ENCOUNTER — Other Ambulatory Visit: Payer: Self-pay | Admitting: Specialist

## 2020-07-02 DIAGNOSIS — R2232 Localized swelling, mass and lump, left upper limb: Secondary | ICD-10-CM

## 2020-07-02 NOTE — Telephone Encounter (Signed)
I have called and lmom for pt to let her know that it was closed out at some point and a new order has been placed and they should be calling her in the next couple of days

## 2020-07-03 ENCOUNTER — Other Ambulatory Visit: Payer: Self-pay | Admitting: Family Medicine

## 2020-07-09 ENCOUNTER — Other Ambulatory Visit: Payer: Self-pay | Admitting: Specialist

## 2020-07-09 ENCOUNTER — Other Ambulatory Visit: Payer: Self-pay

## 2020-07-09 ENCOUNTER — Ambulatory Visit (INDEPENDENT_AMBULATORY_CARE_PROVIDER_SITE_OTHER): Payer: Medicare HMO | Admitting: Gastroenterology

## 2020-07-09 ENCOUNTER — Encounter: Payer: Self-pay | Admitting: Gastroenterology

## 2020-07-09 VITALS — BP 143/73 | HR 87 | Temp 97.3°F | Ht 65.0 in | Wt 179.0 lb

## 2020-07-09 DIAGNOSIS — Z139 Encounter for screening, unspecified: Secondary | ICD-10-CM | POA: Diagnosis not present

## 2020-07-09 DIAGNOSIS — Z0000 Encounter for general adult medical examination without abnormal findings: Secondary | ICD-10-CM

## 2020-07-09 DIAGNOSIS — R2232 Localized swelling, mass and lump, left upper limb: Secondary | ICD-10-CM

## 2020-07-09 DIAGNOSIS — R1010 Upper abdominal pain, unspecified: Secondary | ICD-10-CM

## 2020-07-09 DIAGNOSIS — E782 Mixed hyperlipidemia: Secondary | ICD-10-CM | POA: Diagnosis not present

## 2020-07-09 DIAGNOSIS — Z1329 Encounter for screening for other suspected endocrine disorder: Secondary | ICD-10-CM | POA: Diagnosis not present

## 2020-07-09 NOTE — Patient Instructions (Signed)
Please add Miralax one capful each evening to help with constipation. Let me know if this doesn't help!  I have ordered blood work to be done!  We will see you in 3 months!  I enjoyed seeing you again today! As you know, I value our relationship and want to provide genuine, compassionate, and quality care. I welcome your feedback. If you receive a survey regarding your visit,  I greatly appreciate you taking time to fill this out. See you next time!  Annitta Needs, PhD, ANP-BC [facility]

## 2020-07-09 NOTE — Progress Notes (Signed)
Referring Provider: Shary Key, DO Primary Care Physician:  Shary Key, DO Primary GI: Dr. Gala Romney  Chief Complaint  Patient presents with   [facility]    All day long, worse at night   Abdominal Pain    Upper abd   Constipation    Can go 3-4 days without BM    HPI:   [name] is a 60 y.o. female presenting today with a history of chronic constipation, dysphagia s/p multiple dilations with most recent EGD Dec 2021: non-obstructing Schatzki's ring s/p dilation with 56, 71, and 31 [place]. Chronically isolated elevation of Alk phos with GGT normal and AMA negative. ASMA negative. No CBD dilation, transaminases remaining normal. Chronic abdominal pain.   Abdominal pain: Upper abdominal pain, feels bloated. Worsened with constipation.   Constipation: Linzess 290 mcg daily but not as effective for past month. Going every 3 days. Sometimes straining with BM. Small amount coming out. Last colonoscopy in 2019. No rectal bleeding. Had been on a new BP medication and started to feel constipated then stopped this. Can't remember what it was called but hasn't been on it for a month. ?HCTZ. Has been under a lot of stress lately with family. Does not want to change Linzess as this has worked well for her in the past.   GERD: Dexilant working best compared to any other PPI.   Past Medical History:  Diagnosis Date   Anxiety    Asthma    every now and then.  Wheezing and coughing   Cancer ([place]) 1997   left lung   Chronic abdominal pain    Chronic back pain    Chronic joint pain    Depression    HLD (hyperlipidemia)    Palpitations 01/2013   chronic, intermittent   Shortness of breath dyspnea    with exertion    Past Surgical History:  Procedure Laterality Date   ABDOMINAL HYSTERECTOMY     ABDOMINAL SURGERY     ovarian cyst removal   BIOPSY N/A 11/22/2013   Procedure: GASTRIC BIOPSY;  Surgeon: Daneil Dolin, MD;  [facility];  Service: Endoscopy;  Laterality:  N/A;   BLADDER SURGERY  suspension x4   x 4   CARPAL TUNNEL RELEASE Right 07/11/2015   Procedure: RIGHT CARPAL TUNNEL RELEASE;  Surgeon: Jessy Oto, MD;  Location: [place];  Service: Orthopedics;  Laterality: Right;   CHOLECYSTECTOMY N/A 04/05/2014   Procedure: LAPAROSCOPIC CHOLECYSTECTOMY;  Surgeon: Aviva Signs Md, MD;  Location[facility];  Service: General;  Laterality: N/A;   COCCYGECTOMY N/A 04/11/2015   Procedure: COCCYGECTOMY WITH OSTEOTOMY THROUGH AREA OF DEFORMITY;  Surgeon: Jessy Oto, MD;  Location: [place];  Service: Orthopedics;  Laterality: N/A;   COLONOSCOPY     COLONOSCOPY WITH PROPOFOL N/A 11/22/2013   Dr. Gala Romney: Grade 3 and 4/internal hemorrhoids?"likely source of hematochezia Normal colonoscopy otherwise.    COLONOSCOPY WITH PROPOFOL N/A 09/15/2017   Dr. Emerson Monte: Hemorrhoids, next colonoscopy 10 years   ESOPHAGOGASTRODUODENOSCOPY (EGD) WITH PROPOFOL N/A 11/22/2013   Dr. Gala Romney: Incomplete Schatzki's ring dilated and disrupted as described above.  Small hiatal hernia. Focally abnormal gastric mucosa of uncertain significance status post biopsy, reactive gastropathy, negative H.pylori   ESOPHAGOGASTRODUODENOSCOPY (EGD) WITH PROPOFOL N/A 08/04/2015   Dr. Gala Romney: mild Schatzki's ring s/p dilation, small hiatal hernia    ESOPHAGOGASTRODUODENOSCOPY (EGD) WITH PROPOFOL N/A 03/17/2017   Mild Schatki's ring s/p dilation, small hiatal hernia, otherwise normal  ESOPHAGOGASTRODUODENOSCOPY (EGD) WITH PROPOFOL N/A 09/18/2018   Dr. Gala Romney: Schatzki ring status post dilation and disruption.  Hiatal hernia.   ESOPHAGOGASTRODUODENOSCOPY (EGD) WITH PROPOFOL N/A 12/20/2019   non-obstructing Schatzki's ring s/p dilation with 56, 58, and 74 F.   EXCISION VAGINAL CYST Left 06/20/2012   Procedure: EXCISION LEFT LABIAL CYST;  Surgeon: Jonnie Kind, MD;  [facility];  Service: Gynecology;  Laterality: Left;   LOBECTOMY Left    LUNG CANCER SURGERY  1997   age 25,  resection only   MALONEY DILATION N/A 11/22/2013   Procedure: MALONEY DILATION;  Surgeon: Daneil Dolin, MD;  [facility];  Service: Endoscopy;  Laterality: N/A;  [place] N/A 08/04/2015   Procedure: [name] DILATION;  Surgeon: Daneil Dolin, MD;  [facility];  Service: Endoscopy;  Laterality: N/A;   MALONEY DILATION N/A 03/17/2017   Procedure: [name] DILATION;  Surgeon: Daneil Dolin, MD;  [facility];  Service: Endoscopy;  Laterality: N/A;   MALONEY DILATION N/A 09/18/2018   Procedure: [name] DILATION;  Surgeon: Daneil Dolin, MD;  [facility];  Service: Endoscopy;  Laterality: N/A;   MALONEY DILATION N/A 12/20/2019   Procedure: [name] DILATION;  Surgeon: Daneil Dolin, MD;  [facility];  Service: Endoscopy;  Laterality: N/A;   MASS EXCISION N/A 07/23/2014   Procedure: EXCISIONAL BIOPSY NODULE LEFT PARACOCCYGEAL AREA;  Surgeon: Jessy Oto, MD;  Location: [place];  Service: Orthopedics;  Laterality: N/A;   MASS EXCISION Left 10/10/2015   Procedure: EXCISIONAL BIOPSY OF LEFT DORSORADIAL FOREARM MASS LIPOMA;  Surgeon: Jessy Oto, MD;  Location: [place];  Service: Orthopedics;  Laterality: Left;   TUBAL LIGATION      Current Outpatient Medications  Medication Sig Dispense Refill   acyclovir (ZOVIRAX) 400 MG tablet Take 1 tablet by mouth twice daily 180 tablet 0   albuterol (PROVENTIL) (2.5 MG/3ML) 0.083% nebulizer solution USE 1 VIAL IN NEBULIZER EVERY 6 HOURS AS NEEDED FOR WHEEZING AND FOR SHORTNESS OF BREATH 75 mL 0   amitriptyline (ELAVIL) 150 MG tablet Take 1 tablet by mouth once daily 90 tablet 0   amLODipine (NORVASC) 10 MG tablet Take 1 tablet by mouth once daily 90 tablet 0   atorvastatin (LIPITOR) 40 MG tablet Take 1 tablet by mouth once daily 90 tablet 0   budesonide-formoterol (SYMBICORT) 160-4.5 MCG/ACT inhaler Inhale 2 puffs into the lungs 2 (two) times daily. (Patient taking  differently: Inhale 2 puffs into the lungs 2 (two) times daily as needed (respiratory issues.).) 1 Inhaler 3   celecoxib (CELEBREX) 200 MG capsule Take 1 capsule by mouth once daily 60 capsule 0   DEXILANT 60 MG capsule TAKE 1 CAPSULE BY MOUTH ONCE DAILY WITH BREAKFAST 90 capsule 1   diclofenac Sodium (VOLTAREN) 1 % GEL Apply 2 g topically 4 (four) times daily. 350 g 3   DULoxetine (CYMBALTA) 60 MG capsule Take 1 capsule by mouth twice daily 120 capsule 0   linaclotide (LINZESS) 290 MCG CAPS capsule TAKE 1 CAPSULE BY MOUTH ONCE DAILY BEFORE BREAKFAST 30 capsule 11   Menthol, Topical Analgesic, (BIOFREEZE) 4 % GEL Apaply to area of discomfort left posterior upper neck. 74 mL 3   pregabalin (LYRICA) 100 MG capsule TAKE 1 CAPSULE BY MOUTH THREE TIMES DAILY 90 capsule 0   Current Facility-Administered Medications  Medication Dose Route Frequency Provider Last Rate Last Admin   methylPREDNISolone acetate (DEPO-MEDROL) injection  40 mg  40 mg Intramuscular Once Lanae Crumbly, PA-C        Allergies as of 07/09/2020 - Review Complete 07/09/2020  Allergen Reaction Noted   Promethazine hcl Nausea And Vomiting and Other (See Comments) 07/26/2008    Family History  Problem Relation Age of Onset   Diabetes Other    Heart disease Other        has pace maker   Hypertension Mother    Kidney disease Mother    Hypertension Father    Kidney disease Father    Heart disease Father    Cancer Father        leukemia   Hypertension Sister    Hypertension Sister    Colon cancer Neg Hx     Social History   Socioeconomic History   Marital status: Married    Spouse name: Not on file   Number of children: 3   Years of education: Not on file   Highest education level: Not on file  Occupational History   Not on file  Tobacco Use   Smoking status: Former    Packs/day: 1.50    Years: 25.00    Pack years: 37.50    Types: Cigarettes    Quit date: 04/07/1995    Years since quitting: 25.2   Smokeless  tobacco: Never  Vaping Use   Vaping Use: Never used  Substance and Sexual Activity   Alcohol use: No   Drug use: No   Sexual activity: Not Currently    Birth control/protection: Surgical  Other Topics Concern   Not on file  Social History Narrative   Not on file   Social Determinants of Health   Financial Resource Strain: Not on file  Food Insecurity: Not on file  Transportation Needs: Not on file  Physical Activity: Not on file  Stress: Not on file  Social Connections: Not on file    Review of Systems: Gen: Denies fever, chills, anorexia. Denies fatigue, weakness, weight loss.  CV: Denies chest pain, palpitations, syncope, peripheral edema, and claudication. Resp: Denies dyspnea at rest, cough, wheezing, coughing up blood, and pleurisy. GI:see HPI Derm: Denies rash, itching, dry skin Psych: Denies depression, anxiety, memory loss, confusion. No homicidal or suicidal ideation.  Heme: Denies bruising, bleeding, and enlarged lymph nodes.  Physical Exam: BP (!) 143/73   Pulse 87   Temp (!) 97.3 F (36.3 C)   Ht _0  (1.651 m)   Wt 179 lb (81.2 kg)   BMI 29.79 kg/m  General:   Alert and oriented. No distress noted. Pleasant and cooperative.  Head:  Normocephalic and atraumatic. Eyes:  Conjuctiva clear without scleral icterus. Mouth:  mask in place Abdomen:  +BS, soft, mild TTP upper abdomen and non-distended. No rebound or guarding. No HSM or masses noted. Msk:  Symmetrical without gross deformities. Normal posture. Extremities:  Without edema. Neurologic:  Alert and  oriented x4 Psych:  Alert and cooperative. Normal mood and affect.  ASSESSMENT: ANAIYA WISINSKI is a 60 y.o. female presenting today in routine follow-up with history of chronic abdominal pain, constipation, dysphagia, fatty liver, now with worsening constipation and associated abdominal pain.   Abdominal pain: onset with worsening constipation despite Linzess 290 mcg daily. She does not want to trial  another agent at this point. No alarm signs/symptoms. I feel this is multifactorial in setting of diet, decreased water intake, and stress. Will continue Linzess 290 mcg daily and add Miralax daily as needed. Check TSH.  Dysphagia: resolved. Multiple prior dilations with most recent Dec 2021. Continue Dexilant daily.   Isolated elevation of alk phos: thorough eval as noted in HPI. GGT normal. AMA negative.   PLAN:  Continue Linzess 290 mcg daily and add Miralax 1 capful daily Call if this does not improve abdominal discomfort Check TSH Return in 3 months   Annitta Needs, PhD, [facility]

## 2020-07-10 LAB — CBC WITH DIFFERENTIAL/PLATELET
Absolute Monocytes: 347 cells/uL (ref 200–950)
Basophils Absolute: 41 cells/uL (ref 0–200)
Basophils Relative: 0.9 %
Eosinophils Absolute: 149 cells/uL (ref 15–500)
Eosinophils Relative: 3.3 %
HCT: 40.8 % (ref 35.0–45.0)
Hemoglobin: 13.8 g/dL (ref 11.7–15.5)
Lymphs Abs: 1472 cells/uL (ref 850–3900)
MCH: 31.7 pg (ref 27.0–33.0)
MCHC: 33.8 g/dL (ref 32.0–36.0)
MCV: 93.6 fL (ref 80.0–100.0)
MPV: 9.4 fL (ref 7.5–12.5)
Monocytes Relative: 7.7 %
Neutro Abs: 2493 cells/uL (ref 1500–7800)
Neutrophils Relative %: 55.4 %
Platelets: 290 10*3/uL (ref 140–400)
RBC: 4.36 10*6/uL (ref 3.80–5.10)
RDW: 12.3 % (ref 11.0–15.0)
Total Lymphocyte: 32.7 %
WBC: 4.5 10*3/uL (ref 3.8–10.8)

## 2020-07-10 LAB — COMPLETE METABOLIC PANEL WITH GFR
AG Ratio: 1.5 (calc) (ref 1.0–2.5)
ALT: 13 U/L (ref 6–29)
AST: 12 U/L (ref 10–35)
Albumin: 4.4 g/dL (ref 3.6–5.1)
Alkaline phosphatase (APISO): 141 U/L (ref 37–153)
BUN: 9 mg/dL (ref 7–25)
CO2: 29 mmol/L (ref 20–32)
Calcium: 9.4 mg/dL (ref 8.6–10.4)
Chloride: 103 mmol/L (ref 98–110)
Creat: 0.68 mg/dL (ref 0.50–1.05)
GFR, Est African American: 113 mL/min/{1.73_m2} (ref >=60)
GFR, Est Non African American: 97 mL/min/{1.73_m2} (ref >=60)
Globulin: 3 g/dL (calc) (ref 1.9–3.7)
Glucose, Bld: 100 mg/dL — ABNORMAL HIGH (ref 65–99)
Potassium: 3.8 mmol/L (ref 3.5–5.3)
Sodium: 142 mmol/L (ref 135–146)
Total Bilirubin: 0.5 mg/dL (ref 0.2–1.2)
Total Protein: 7.4 g/dL (ref 6.1–8.1)

## 2020-07-10 LAB — TSH: TSH: 0.83 mIU/L (ref 0.40–4.50)

## 2020-07-14 ENCOUNTER — Other Ambulatory Visit: Payer: Self-pay

## 2020-07-14 ENCOUNTER — Ambulatory Visit (HOSPITAL_COMMUNITY)
Admission: RE | Admit: 2020-07-14 | Discharge: 2020-07-14 | Disposition: A | Discharge status: Home / Self Care | Payer: Medicare HMO | Source: Ambulatory Visit | Attending: Specialist | Admitting: Specialist

## 2020-07-14 ENCOUNTER — Telehealth: Payer: Self-pay

## 2020-07-14 DIAGNOSIS — R2232 Localized swelling, mass and lump, left upper limb: Secondary | ICD-10-CM | POA: Diagnosis not present

## 2020-07-14 DIAGNOSIS — Z85118 Personal history of other malignant neoplasm of bronchus and lung: Secondary | ICD-10-CM | POA: Diagnosis not present

## 2020-07-14 NOTE — Telephone Encounter (Signed)
Has not been resulted at this time.

## 2020-07-14 NOTE — Telephone Encounter (Signed)
Patient called she is requesting a phone call regarding her ultrasound results when Dr.Nitka receives them call [phone]

## 2020-07-14 NOTE — Telephone Encounter (Signed)
Patient calls nurse line regarding painful urination and vaginal itching. Patient is requesting abx for UTI. Informed patient that she would need an appointment for further evaluation. Patient states that [facility] will go to [facility], as [facility] is closer to her home.   Talbot Grumbling, RN

## 2020-07-15 DIAGNOSIS — R350 Frequency of micturition: Secondary | ICD-10-CM | POA: Diagnosis not present

## 2020-07-15 DIAGNOSIS — N3001 Acute cystitis with hematuria: Secondary | ICD-10-CM | POA: Diagnosis not present

## 2020-07-16 NOTE — Telephone Encounter (Signed)
Pt called back asking about the ultrasound results.  She stated that at this time she cant afford to make a follow up appt.

## 2020-07-16 NOTE — Telephone Encounter (Signed)
Pt called back asking about the ultrasound results. She stated that at this time she cant afford to make a follow up appt.

## 2020-07-16 NOTE — Telephone Encounter (Signed)
I called and read the impression to the patient, but I advised that as far as the next step I did not know what Dr. Louanne Skye wanted to do about it and that I have sent him this message and I am waiting for him to advised on the next step.  She states that is fine.

## 2020-07-22 ENCOUNTER — Other Ambulatory Visit: Payer: Self-pay | Admitting: Family Medicine

## 2020-07-22 ENCOUNTER — Other Ambulatory Visit: Payer: Self-pay | Admitting: Gastroenterology

## 2020-07-22 DIAGNOSIS — A609 Anogenital herpesviral infection, unspecified: Secondary | ICD-10-CM

## 2020-07-24 ENCOUNTER — Telehealth: Payer: Self-pay | Admitting: Specialist

## 2020-07-24 ENCOUNTER — Other Ambulatory Visit: Payer: Self-pay | Admitting: Specialist

## 2020-07-24 DIAGNOSIS — R2232 Localized swelling, mass and lump, left upper limb: Secondary | ICD-10-CM

## 2020-07-24 NOTE — Telephone Encounter (Signed)
Patient called. Would like Dr. Louanne Skye to call her. [phone] wants to know what her next steps are.

## 2020-07-24 NOTE — Telephone Encounter (Signed)
I called and spoke with patient and advised that we are going to order the MRI that was recommended by the Radiologist.  I scheduled her an appt for 09/03/20 @ 845 to come in and review the MRI, she is aware that they will be calling her to schedule it.

## 2020-07-24 NOTE — Telephone Encounter (Signed)
We are going to order an MRI at the recommendation of the Radiologist.

## 2020-07-25 ENCOUNTER — Other Ambulatory Visit: Payer: Self-pay | Admitting: Family Medicine

## 2020-08-06 ENCOUNTER — Ambulatory Visit (HOSPITAL_COMMUNITY)
Admission: RE | Admit: 2020-08-06 | Discharge: 2020-08-06 | Disposition: A | Discharge status: Home / Self Care | Payer: Medicare HMO | Source: Ambulatory Visit | Attending: Specialist | Admitting: Specialist

## 2020-08-06 ENCOUNTER — Other Ambulatory Visit: Payer: Self-pay

## 2020-08-06 DIAGNOSIS — R2232 Localized swelling, mass and lump, left upper limb: Secondary | ICD-10-CM | POA: Insufficient documentation

## 2020-08-06 MED ORDER — GADOBUTROL 1 MMOL/ML IV SOLN
7.5000 mL | Freq: Once | INTRAVENOUS | Status: AC | PRN
  Administered 2020-08-06: 7.5 mL via INTRAVENOUS

## 2020-08-11 ENCOUNTER — Other Ambulatory Visit: Payer: Self-pay | Admitting: Family Medicine

## 2020-08-11 DIAGNOSIS — I10 Essential (primary) hypertension: Secondary | ICD-10-CM

## 2020-08-12 ENCOUNTER — Telehealth: Payer: Self-pay | Admitting: Internal Medicine

## 2020-08-12 DIAGNOSIS — R140 Abdominal distension (gaseous): Secondary | ICD-10-CM

## 2020-08-12 DIAGNOSIS — R109 Unspecified abdominal pain: Secondary | ICD-10-CM

## 2020-08-12 NOTE — Telephone Encounter (Signed)
Pt made aware of recommendations.  She was made aware to go to ED if severe pain.  She said she would like to do stat CT first.  Routing to Roseanne Kaufman, NP.

## 2020-08-12 NOTE — Addendum Note (Signed)
Addended by: Cheron Every on: 08/12/2020 01:35 PM   Modules accepted: Orders

## 2020-08-12 NOTE — Telephone Encounter (Signed)
Patient asked to speak with nurse about her side hurting with swelling. Please call [phone]

## 2020-08-12 NOTE — Telephone Encounter (Signed)
Lmom for pt to call me back. 

## 2020-08-12 NOTE — Telephone Encounter (Signed)
Pt stated her stomach feels tight.  Bulging where hernia is located.  Hurts really bad to the touch.  Pain in stomach getting worse.  Takes Linzess 290 mcg daily and Miralax every evening.  Having some constipation.  Went 3 days without bm but finally had one yesterday after eating cherries.  Still straining and not emptying out completely.  No fever, n/v, or rectal bleeding.  Would like referral to f/u on hernia.

## 2020-08-12 NOTE — Telephone Encounter (Signed)
CT A/P requires PA via [employer]. PA done and requires clinical review. Clinicals faxed in. Will await approval/denial  Tracking # [id number]

## 2020-08-12 NOTE — Telephone Encounter (Signed)
IF she is referring to a hiatal hernia, this doesn't protrude from abdomen where you can see it. If severe pain, go to ED>  Otherwise, we can order stat CT abd/pelvis with contrast stat due to worsening abdominal pain, abdominal distension.

## 2020-08-13 NOTE — Telephone Encounter (Signed)
Checked PA and it is still pending review 

## 2020-08-13 NOTE — Telephone Encounter (Signed)
PA approved via [employer]. Auth# [id number] DOS 08/12/2020-09/11/2020  Called pt. She states she is not able to go today for CT. She needs to go tomorrow.   Called central scheduling. Scheduled for tomorrow at 1:30pm, arrival 1:15pm, npo 4 hours, p/u oral contrast. Called pt and made aware. She voiced understanding

## 2020-08-14 ENCOUNTER — Ambulatory Visit (HOSPITAL_COMMUNITY)
Admission: RE | Admit: 2020-08-14 | Discharge: 2020-08-14 | Disposition: A | Discharge status: Home / Self Care | Payer: Medicare HMO | Source: Ambulatory Visit | Attending: Gastroenterology | Admitting: Gastroenterology

## 2020-08-14 ENCOUNTER — Other Ambulatory Visit: Payer: Self-pay

## 2020-08-14 DIAGNOSIS — R140 Abdominal distension (gaseous): Secondary | ICD-10-CM | POA: Diagnosis not present

## 2020-08-14 DIAGNOSIS — R109 Unspecified abdominal pain: Secondary | ICD-10-CM | POA: Diagnosis not present

## 2020-08-14 LAB — POCT I-STAT CREATININE: Creatinine, Ser: 0.8 mg/dL (ref 0.44–1.00)

## 2020-08-14 MED ORDER — IOHEXOL 300 MG/ML  SOLN
100.0000 mL | Freq: Once | INTRAMUSCULAR | Status: AC | PRN
  Administered 2020-08-14: 100 mL via INTRAVENOUS

## 2020-08-26 ENCOUNTER — Other Ambulatory Visit: Payer: Self-pay | Admitting: Family Medicine

## 2020-09-01 ENCOUNTER — Other Ambulatory Visit: Payer: Self-pay | Admitting: Family Medicine

## 2020-09-01 DIAGNOSIS — M48062 Spinal stenosis, lumbar region with neurogenic claudication: Secondary | ICD-10-CM

## 2020-09-03 ENCOUNTER — Other Ambulatory Visit: Payer: Self-pay

## 2020-09-03 ENCOUNTER — Encounter: Payer: Self-pay | Admitting: Specialist

## 2020-09-03 ENCOUNTER — Ambulatory Visit (INDEPENDENT_AMBULATORY_CARE_PROVIDER_SITE_OTHER): Payer: Medicare HMO | Admitting: Specialist

## 2020-09-03 VITALS — BP 147/70 | HR 80 | Ht 65.0 in | Wt 171.0 lb

## 2020-09-03 DIAGNOSIS — R2232 Localized swelling, mass and lump, left upper limb: Secondary | ICD-10-CM | POA: Diagnosis not present

## 2020-09-03 DIAGNOSIS — G8929 Other chronic pain: Secondary | ICD-10-CM

## 2020-09-03 DIAGNOSIS — M545 Low back pain, unspecified: Secondary | ICD-10-CM | POA: Diagnosis not present

## 2020-09-03 DIAGNOSIS — M542 Cervicalgia: Secondary | ICD-10-CM

## 2020-09-03 DIAGNOSIS — M47812 Spondylosis without myelopathy or radiculopathy, cervical region: Secondary | ICD-10-CM

## 2020-09-03 NOTE — Patient Instructions (Signed)
Plan: Baby aspirin 81 mg po daily, Use heal moist compress when uncomfortable. Elevation helps decrease vein swelling , above the heart. The MRI shows a confluence of veins that is normal but can cause swelling when  They are distended. Surgery is not indicated.    Follow-Up Instructions: No follow-ups on file.

## 2020-09-03 NOTE — Progress Notes (Signed)
Office Visit Note   Patient: [name]           Date of Birth: [birth year]           MRN: [id number] Visit Date: 09/03/2020              Requested by: Shary Key, DO [place],  [place] [id number] PCP: Shary Key, DO   Assessment & Plan: Visit Diagnoses:  1. Elbow mass, left   2. Spondylosis without myelopathy or radiculopathy, cervical region   3. Neck pain   4. Chronic low back pain, unspecified back pain laterality, unspecified whether sciatica present     Plan: Baby aspirin 81 mg po daily, Use heal moist compress when uncomfortable. Elevation helps decrease vein swelling , above the heart. The MRI shows a confluence of veins that is normal but can cause swelling when  They are distended. Surgery is not indicated.   Follow-Up Instructions: No follow-ups on file.   Orders:  No orders of the defined types were placed in this encounter.  No orders of the defined types were placed in this encounter.     Procedures: No procedures performed   Clinical Data: No additional findings.   Subjective: Chief Complaint  Patient presents with  . Left Elbow - Follow-up    MRI Review    HPI  Review of Systems  Constitutional: Negative.   HENT: Negative.    Eyes: Negative.   Respiratory: Negative.    Cardiovascular: Negative.   Gastrointestinal: Negative.   Endocrine: Negative.   Genitourinary: Negative.   Musculoskeletal: Negative.   Skin: Negative.   Allergic/Immunologic: Negative.   Neurological: Negative.   Hematological: Negative.   Psychiatric/Behavioral: Negative.      Objective: Vital Signs: BP (!) 147/70 (BP Location: Right Arm, Patient Position: Sitting)   Pulse 80   Ht 5\' 5"  (1.651 m)   Wt 171 lb (77.6 kg)   BMI 28.46 kg/m   Physical Exam Constitutional:      Appearance: She is well-developed.  HENT:     Head: Normocephalic and atraumatic.  Eyes:     Pupils: Pupils are equal, round, and reactive to light.   Pulmonary:     Effort: Pulmonary effort is normal.     Breath sounds: Normal breath sounds.  Abdominal:     General: Bowel sounds are normal.     Palpations: Abdomen is soft.  Musculoskeletal:        General: Normal range of motion.     Cervical back: Normal range of motion and neck supple.  Skin:    General: Skin is warm and dry.  Neurological:     Mental Status: She is alert and oriented to person, place, and time.  Psychiatric:        Behavior: Behavior normal.        Thought Content: Thought content normal.        Judgment: Judgment normal.   Back Exam   Tenderness  The patient is experiencing tenderness in the lumbar.   Right Elbow Exam   Tenderness  The patient is experiencing tenderness in the medial epicondyle.     Specialty Comments:  No specialty comments available.  Imaging: No results found.   PMFS History: Patient Active Problem List   Diagnosis Date Noted  . Mass of left forearm 10/10/2015    Priority: High    Class: Chronic  . Carpal tunnel syndrome, right 07/11/2015    Priority: High  Class: Chronic  . Spondylolisthesis of lumbar region 01/03/2015    Priority: High    Class: Chronic  . Soft tissue mass 07/23/2014    Priority: High    Class: Acute  . Acute conjunctivitis of both eyes 03/25/2020  . Radial tunnel syndrome 02/06/2019  . Sacroiliac inflammation ([place]) 05/23/2018  . Poor sleep hygiene 05/23/2018  . Chronic obstructive pulmonary disease with (acute) exacerbation ([place]) 05/23/2018  . Orthostatic hypotension 05/23/2018  . Syncope and collapse 05/23/2018  . Elevated fasting glucose 05/23/2018  . Nocturia more than twice per night 05/23/2018  . Snoring 05/23/2018  . Hemorrhoid 02/15/2018  . Abdominal distention 02/15/2018  . Anemia 01/09/2018  . Pain of upper abdomen 01/09/2018  . Healthcare maintenance 12/30/2017  . Antibiotic-associated diarrhea 12/22/2017  . Vagina itching 12/22/2017  . Sepsis ([place])   . Respiratory  distress   . Partial small bowel obstruction ([place]) 12/11/2017  . COPD with acute exacerbation ([place]) 12/11/2017  . Hypophosphatemia 12/11/2017  . Sepsis due to undetermined organism ([place]) 12/10/2017  . AKI (acute kidney injury) ([place]) 12/10/2017  . Hyperbilirubinemia 12/10/2017  . Aortic regurgitation 10/25/2017  . Diastolic dysfunction 96/29/5284  . Dysphagia 01/20/2017  . Left hip pain 01/19/2017  . Right hand pain 01/19/2017  . Diarrhea 10/11/2016  . Right hip pain 06/07/2016  . Dysuria 04/14/2016  . Malaise and fatigue 04/14/2016  . Breast pain, left 11/06/2015  . Dysphagia, oropharyngeal   . Gastroesophageal reflux disease without esophagitis   . Prediabetes 07/09/2015  . Lumbar stenosis with neurogenic claudication 01/05/2015  . Coccygodynia 07/23/2014    Class: Acute  . Constipation 02/15/2014  . Dysphagia, pharyngoesophageal phase   . Elevated alkaline phosphatase level 11/06/2013  . Rectal bleeding 11/06/2013  . Obesity, unspecified 02/15/2013  . Genital herpes 10/12/2012  . HSV (herpes simplex virus) anogenital infection 09/29/2012  . UNSPECIFIED SLEEP APNEA 12/04/2008  . ADENOCARCINOMA, LUNG 10/09/2008  . Osteoarthritis of multiple joints 07/13/2006  . HYPERCHOLESTEROLEMIA 03/17/2006  . DEPRESSION, MAJOR, RECURRENT 03/17/2006  . HYPERTENSION, BENIGN SYSTEMIC 03/17/2006  . GASTROESOPHAGEAL REFLUX, NO ESOPHAGITIS 03/17/2006  . Insomnia due to medical condition 03/17/2006   Past Medical History:  Diagnosis Date  . Anxiety   . Asthma    every now and then.  Wheezing and coughing  . Cancer ([place]) 1997   left lung  . Chronic abdominal pain   . Chronic back pain   . Chronic joint pain   . Depression   . HLD (hyperlipidemia)   . Palpitations 01/2013   chronic, intermittent  . Shortness of breath dyspnea    with exertion    Family History  Problem Relation Age of Onset  . Diabetes Other   . Heart disease Other        has Psychologist, forensic  . Hypertension Mother    . Kidney disease Mother   . Hypertension Father   . Kidney disease Father   . Heart disease Father   . Cancer Father        leukemia  . Hypertension Sister   . Hypertension Sister   . Colon cancer Neg Hx     Past Surgical History:  Procedure Laterality Date  . ABDOMINAL HYSTERECTOMY    . ABDOMINAL SURGERY     ovarian cyst removal  . BIOPSY N/A 11/22/2013   Procedure: GASTRIC BIOPSY;  Surgeon: Daneil Dolin, MD;  Location: [facility];  Service: Endoscopy;  Laterality: N/A;  . BLADDER SURGERY  suspension x4   x 4  .  CARPAL TUNNEL RELEASE Right 07/11/2015   Procedure: RIGHT CARPAL TUNNEL RELEASE;  Surgeon: Jessy Oto, MD;  Location: [facility];  Service: Orthopedics;  Laterality: Right;  . CHOLECYSTECTOMY N/A 04/05/2014   Procedure: LAPAROSCOPIC CHOLECYSTECTOMY;  Surgeon: Aviva Signs Md, MD;  Location: [facility];  Service: General;  Laterality: N/A;  . COCCYGECTOMY N/A 04/11/2015   Procedure: COCCYGECTOMY WITH OSTEOTOMY THROUGH AREA OF DEFORMITY;  Surgeon: Jessy Oto, MD;  Location: [facility];  Service: Orthopedics;  Laterality: N/A;  . COLONOSCOPY    . COLONOSCOPY WITH PROPOFOL N/A 11/22/2013   Dr. Gala Romney: Grade 3 and 4/internal hemorrhoids?"likely source of hematochezia Normal colonoscopy otherwise.   Marland Kitchen COLONOSCOPY WITH PROPOFOL N/A 09/15/2017   Dr. Emerson Monte: Hemorrhoids, next colonoscopy 10 years  . ESOPHAGOGASTRODUODENOSCOPY (EGD) WITH PROPOFOL N/A 11/22/2013   Dr. Gala Romney: Incomplete Schatzki's ring dilated and disrupted as described above.  Small hiatal hernia. Focally abnormal gastric mucosa of uncertain significance status post biopsy, reactive gastropathy, negative H.pylori  . ESOPHAGOGASTRODUODENOSCOPY (EGD) WITH PROPOFOL N/A 08/04/2015   Dr. Gala Romney: mild Schatzki's ring s/p dilation, small hiatal hernia   . ESOPHAGOGASTRODUODENOSCOPY (EGD) WITH PROPOFOL N/A 03/17/2017   Mild Schatki's ring s/p dilation, small hiatal hernia, otherwise normal  .  ESOPHAGOGASTRODUODENOSCOPY (EGD) WITH PROPOFOL N/A 09/18/2018   Dr. Gala Romney: Schatzki ring status post dilation and disruption.  Hiatal hernia.  Marland Kitchen ESOPHAGOGASTRODUODENOSCOPY (EGD) WITH PROPOFOL N/A 12/20/2019   non-obstructing Schatzki's ring s/p dilation with 56, 58, and 34 F.  Marland Kitchen EXCISION VAGINAL CYST Left 06/20/2012   Procedure: EXCISION LEFT LABIAL CYST;  Surgeon: Jonnie Kind, MD;  Location: [facility];  Service: Gynecology;  Laterality: Left;  . LOBECTOMY Left   . LUNG CANCER SURGERY  1997   age 63, resection only  . MALONEY DILATION N/A 11/22/2013   Procedure: [name] DILATION;  Surgeon: Daneil Dolin, MD;  Location: [facility];  Service: Endoscopy;  Laterality: N/A;  56  . MALONEY DILATION N/A 08/04/2015   Procedure: [name] DILATION;  Surgeon: Daneil Dolin, MD;  [facility];  Service: Endoscopy;  Laterality: N/A;  . [name] DILATION N/A 03/17/2017   Procedure: [name] DILATION;  Surgeon: Daneil Dolin, MD;  [facility];  Service: Endoscopy;  Laterality: N/A;  . [name] DILATION N/A 09/18/2018   Procedure: [name] DILATION;  Surgeon: Daneil Dolin, MD;  [facility];  Service: Endoscopy;  Laterality: N/A;  [name] DILATION N/A 12/20/2019   Procedure: [name] DILATION;  Surgeon: Daneil Dolin, MD;  [facility];  Service: Endoscopy;  Laterality: N/A;  . MASS EXCISION N/A 07/23/2014   Procedure: EXCISIONAL BIOPSY NODULE LEFT PARACOCCYGEAL AREA;  Surgeon: Jessy Oto, MD;  Location: [facility];  Service: Orthopedics;  Laterality: N/A;  . MASS EXCISION Left 10/10/2015   Procedure: EXCISIONAL BIOPSY OF LEFT DORSORADIAL FOREARM MASS LIPOMA;  Surgeon: Jessy Oto, MD;  Location: [facility];  Service: Orthopedics;  Laterality: Left;  . TUBAL LIGATION     Social History   Occupational History  . Not on file  Tobacco Use  . Smoking status: Former    Packs/day: 1.50    Years: 25.00    Pack years: 37.50    Types: Cigarettes     Quit date: 04/07/1995    Years since quitting: 25.4  . Smokeless tobacco: Never  Vaping Use  . Vaping Use: Never used  Substance and Sexual Activity  . Alcohol use: No  . Drug use:  No  . Sexual activity: Not Currently    Birth control/protection: Surgical

## 2020-09-08 ENCOUNTER — Other Ambulatory Visit: Payer: Self-pay | Admitting: Family Medicine

## 2020-09-22 ENCOUNTER — Other Ambulatory Visit: Payer: Self-pay | Admitting: Family Medicine

## 2020-09-22 DIAGNOSIS — E7800 Pure hypercholesterolemia, unspecified: Secondary | ICD-10-CM

## 2020-10-16 ENCOUNTER — Other Ambulatory Visit: Payer: Self-pay | Admitting: Family Medicine

## 2020-10-24 ENCOUNTER — Other Ambulatory Visit: Payer: Self-pay | Admitting: Family Medicine

## 2020-10-24 DIAGNOSIS — A609 Anogenital herpesviral infection, unspecified: Secondary | ICD-10-CM

## 2020-11-01 DIAGNOSIS — N750 Cyst of Bartholin's gland: Secondary | ICD-10-CM | POA: Diagnosis not present

## 2020-11-01 DIAGNOSIS — R309 Painful micturition, unspecified: Secondary | ICD-10-CM | POA: Diagnosis not present

## 2020-11-01 DIAGNOSIS — K649 Unspecified hemorrhoids: Secondary | ICD-10-CM | POA: Diagnosis not present

## 2020-11-04 ENCOUNTER — Ambulatory Visit: Payer: Medicare HMO | Admitting: Gastroenterology

## 2020-11-04 ENCOUNTER — Encounter: Payer: Self-pay | Admitting: Internal Medicine

## 2020-11-11 ENCOUNTER — Emergency Department (HOSPITAL_COMMUNITY)
Admission: EM | Admit: 2020-11-11 | Discharge: 2020-11-11 | Disposition: A | Discharge status: Home / Self Care | Payer: Medicare HMO | Attending: Emergency Medicine | Admitting: Emergency Medicine

## 2020-11-11 ENCOUNTER — Other Ambulatory Visit: Payer: Self-pay

## 2020-11-11 ENCOUNTER — Encounter (HOSPITAL_COMMUNITY): Payer: Self-pay | Admitting: Emergency Medicine

## 2020-11-11 DIAGNOSIS — J45909 Unspecified asthma, uncomplicated: Secondary | ICD-10-CM | POA: Diagnosis not present

## 2020-11-11 DIAGNOSIS — R509 Fever, unspecified: Secondary | ICD-10-CM | POA: Diagnosis not present

## 2020-11-11 DIAGNOSIS — D72819 Decreased white blood cell count, unspecified: Secondary | ICD-10-CM | POA: Insufficient documentation

## 2020-11-11 DIAGNOSIS — J441 Chronic obstructive pulmonary disease with (acute) exacerbation: Secondary | ICD-10-CM | POA: Diagnosis not present

## 2020-11-11 DIAGNOSIS — R21 Rash and other nonspecific skin eruption: Secondary | ICD-10-CM | POA: Insufficient documentation

## 2020-11-11 DIAGNOSIS — Z20822 Contact with and (suspected) exposure to covid-19: Secondary | ICD-10-CM | POA: Diagnosis not present

## 2020-11-11 DIAGNOSIS — Z7951 Long term (current) use of inhaled steroids: Secondary | ICD-10-CM | POA: Diagnosis not present

## 2020-11-11 DIAGNOSIS — Z87891 Personal history of nicotine dependence: Secondary | ICD-10-CM | POA: Diagnosis not present

## 2020-11-11 DIAGNOSIS — Z85118 Personal history of other malignant neoplasm of bronchus and lung: Secondary | ICD-10-CM | POA: Insufficient documentation

## 2020-11-11 DIAGNOSIS — Z79899 Other long term (current) drug therapy: Secondary | ICD-10-CM | POA: Diagnosis not present

## 2020-11-11 DIAGNOSIS — R0602 Shortness of breath: Secondary | ICD-10-CM | POA: Diagnosis not present

## 2020-11-11 DIAGNOSIS — I10 Essential (primary) hypertension: Secondary | ICD-10-CM | POA: Diagnosis not present

## 2020-11-11 DIAGNOSIS — R059 Cough, unspecified: Secondary | ICD-10-CM | POA: Insufficient documentation

## 2020-11-11 LAB — URINALYSIS, ROUTINE W REFLEX MICROSCOPIC
Bilirubin Urine: NEGATIVE
Glucose, UA: NEGATIVE mg/dL
Ketones, ur: NEGATIVE mg/dL
Leukocytes,Ua: NEGATIVE
Nitrite: NEGATIVE
Protein, ur: 30 mg/dL — AB
Specific Gravity, Urine: 1.02 (ref 1.005–1.030)
pH: 5 (ref 5.0–8.0)

## 2020-11-11 LAB — CBC WITH DIFFERENTIAL/PLATELET
Basophils Absolute: 0 10*3/uL (ref 0.0–0.1)
Basophils Relative: 0 %
Eosinophils Absolute: 0.1 10*3/uL (ref 0.0–0.5)
Eosinophils Relative: 3 %
HCT: 38.8 % (ref 36.0–46.0)
Hemoglobin: 12.5 g/dL (ref 12.0–15.0)
Lymphocytes Relative: 55 %
Lymphs Abs: 1.7 10*3/uL (ref 0.7–4.0)
MCH: 29.5 pg (ref 26.0–34.0)
MCHC: 32.2 g/dL (ref 30.0–36.0)
MCV: 91.5 fL (ref 80.0–100.0)
Monocytes Absolute: 0.3 10*3/uL (ref 0.1–1.0)
Monocytes Relative: 10 %
Neutro Abs: 1 10*3/uL — ABNORMAL LOW (ref 1.7–7.7)
Neutrophils Relative %: 32 %
Platelets: 202 10*3/uL (ref 150–400)
RBC: 4.24 MIL/uL (ref 3.87–5.11)
RDW: 13.9 % (ref 11.5–15.5)
WBC: 3.1 10*3/uL — ABNORMAL LOW (ref 4.0–10.5)
nRBC: 0 % (ref 0.0–0.2)

## 2020-11-11 LAB — COMPREHENSIVE METABOLIC PANEL
ALT: 25 U/L (ref 0–44)
AST: 22 U/L (ref 15–41)
Albumin: 3.9 g/dL (ref 3.5–5.0)
Alkaline Phosphatase: 199 U/L — ABNORMAL HIGH (ref 38–126)
Anion gap: 8 (ref 5–15)
BUN: 9 mg/dL (ref 6–20)
CO2: 27 mmol/L (ref 22–32)
Calcium: 8.6 mg/dL — ABNORMAL LOW (ref 8.9–10.3)
Chloride: 101 mmol/L (ref 98–111)
Creatinine, Ser: 0.67 mg/dL (ref 0.44–1.00)
GFR, Estimated: >60 mL/min (ref >60)
Glucose, Bld: 106 mg/dL — ABNORMAL HIGH (ref 70–99)
Potassium: 3.6 mmol/L (ref 3.5–5.1)
Sodium: 136 mmol/L (ref 135–145)
Total Bilirubin: 0.5 mg/dL (ref 0.3–1.2)
Total Protein: 7.9 g/dL (ref 6.5–8.1)

## 2020-11-11 LAB — SEDIMENTATION RATE: Sed Rate: 37 mm/hr — ABNORMAL HIGH (ref 0–22)

## 2020-11-11 LAB — RESP PANEL BY RT-PCR (FLU A&B, COVID) ARPGX2
Influenza A by PCR: NEGATIVE
Influenza B by PCR: NEGATIVE
SARS Coronavirus 2 by RT PCR: NEGATIVE

## 2020-11-11 LAB — C-REACTIVE PROTEIN: CRP: 0.6 mg/dL (ref <1.0)

## 2020-11-11 LAB — HIV ANTIBODY (ROUTINE TESTING W REFLEX): HIV Screen 4th Generation wRfx: NONREACTIVE

## 2020-11-11 MED ORDER — ACETAMINOPHEN 325 MG PO TABS
650.0000 mg | ORAL_TABLET | Freq: Once | ORAL | Status: AC
  Administered 2020-11-11: 650 mg via ORAL
  Filled 2020-11-11: qty 2

## 2020-11-11 MED ORDER — CEPHALEXIN 500 MG PO CAPS: 500.0000 mg | ORAL_CAPSULE | Freq: Four times a day (QID) | ORAL | 0 refills | Status: AC

## 2020-11-11 MED ORDER — PREDNISONE 10 MG (21) PO TBPK: ORAL_TABLET | Freq: Every day | ORAL | 0 refills | Status: DC

## 2020-11-11 NOTE — ED Provider Notes (Signed)
[facility] EMERGENCY DEPARTMENT Provider Note   CSN: [id number] Arrival date & time: 11/11/20  0935     History Chief Complaint  Patient presents with   Rash    Rash to right neck and left neck and small area to abdomen.  Blisters to right and left foot about 50 cent size.  Pt reports being on antibiotics for UTI about 2 weeks ago.      [name] is a 60 y.o. female.  HPI   60 y/o female with a h/o anxiety, asthma, left lung cancer, chronic abdominal pain, chronic back pain, depression, hyperlipidemia, palpitations, shortness of breath, who presents to the emergency department today for evaluation of a rash.  States she has had a rash for the last several days.  She has 2 areas of erythema to her feet which have both blistered.  She has several other areas to her feet, neck and abdomen that are painful to touch.  She has had some body aches and chills at home.  She has also had a mild cough which is somewhat chronic in nature due to her COPD.  She took a COVID test at home which was negative.  She further reports that she was diagnosed with a UTI about a week and a half ago and started on Bactrim.  When her rash started she contacted the emergency department and was told to stop the Bactrim which she did about 2 to 3 days ago.  Denies any recent tick bites.  Past Medical History:  Diagnosis Date   Anxiety    Asthma    every now and then.  Wheezing and coughing   Cancer ([place]) 1997   left lung   Chronic abdominal pain    Chronic back pain    Chronic joint pain    Depression    HLD (hyperlipidemia)    Palpitations 01/2013   chronic, intermittent   Shortness of breath dyspnea    with exertion    Patient Active Problem List   Diagnosis Date Noted   Acute conjunctivitis of both eyes 03/25/2020   Radial tunnel syndrome 02/06/2019   Sacroiliac inflammation ([place]) 05/23/2018   Poor sleep hygiene 05/23/2018   Chronic obstructive pulmonary disease with (acute) exacerbation  (HCC) 05/23/2018   Orthostatic hypotension 05/23/2018   Syncope and collapse 05/23/2018   Elevated fasting glucose 05/23/2018   Nocturia more than twice per night 05/23/2018   Snoring 05/23/2018   Hemorrhoid 02/15/2018   Abdominal distention 02/15/2018   Anemia 01/09/2018   Pain of upper abdomen 01/09/2018   Healthcare maintenance 12/30/2017   Antibiotic-associated diarrhea 12/22/2017   Vagina itching 12/22/2017   Sepsis ([place])    Respiratory distress    Partial small bowel obstruction ([place]) 12/11/2017   COPD with acute exacerbation ([place]) 12/11/2017   Hypophosphatemia 12/11/2017   Sepsis due to undetermined organism ([place]) 12/10/2017   AKI (acute kidney injury) ([place]) 12/10/2017   Hyperbilirubinemia 12/10/2017   Aortic regurgitation 62/69/4854   Diastolic dysfunction 62/70/3500   Dysphagia 01/20/2017   Left hip pain 01/19/2017   Right hand pain 01/19/2017   Diarrhea 10/11/2016   Right hip pain 06/07/2016   Dysuria 04/14/2016   Malaise and fatigue 04/14/2016   Breast pain, left 11/06/2015   Mass of left forearm 10/10/2015    Class: Chronic   Dysphagia, oropharyngeal    Gastroesophageal reflux disease without esophagitis    Carpal tunnel syndrome, right 07/11/2015    Class: Chronic   Prediabetes 07/09/2015   Lumbar  stenosis with neurogenic claudication 01/05/2015   Spondylolisthesis of lumbar region 01/03/2015    Class: Chronic   Soft tissue mass 07/23/2014    Class: Acute   Coccygodynia 07/23/2014    Class: Acute   Constipation 02/15/2014   Dysphagia, pharyngoesophageal phase    Elevated alkaline phosphatase level 11/06/2013   Rectal bleeding 11/06/2013   Obesity, unspecified 02/15/2013   Genital herpes 10/12/2012   HSV (herpes simplex virus) anogenital infection 09/29/2012   UNSPECIFIED SLEEP APNEA 12/04/2008   ADENOCARCINOMA, LUNG 10/09/2008   Osteoarthritis of multiple joints 07/13/2006   HYPERCHOLESTEROLEMIA 03/17/2006   DEPRESSION, MAJOR, RECURRENT 03/17/2006    HYPERTENSION, BENIGN SYSTEMIC 03/17/2006   GASTROESOPHAGEAL REFLUX, NO ESOPHAGITIS 03/17/2006   Insomnia due to medical condition 03/17/2006    Past Surgical History:  Procedure Laterality Date   ABDOMINAL HYSTERECTOMY     ABDOMINAL SURGERY     ovarian cyst removal   BIOPSY N/A 11/22/2013   Procedure: GASTRIC BIOPSY;  Surgeon: Daneil Dolin, MD;  Location: [facility];  Service: Endoscopy;  Laterality: N/A;   BLADDER SURGERY  suspension x4   x 4   CARPAL TUNNEL RELEASE Right 07/11/2015   Procedure: RIGHT CARPAL TUNNEL RELEASE;  Surgeon: Jessy Oto, MD;  Location: [place];  Service: Orthopedics;  Laterality: Right;   CHOLECYSTECTOMY N/A 04/05/2014   Procedure: LAPAROSCOPIC CHOLECYSTECTOMY;  Surgeon: Aviva Signs Md, MD;  Location: [facility];  Service: General;  Laterality: N/A;   COCCYGECTOMY N/A 04/11/2015   Procedure: COCCYGECTOMY WITH OSTEOTOMY THROUGH AREA OF DEFORMITY;  Surgeon: Jessy Oto, MD;  Location: [place];  Service: Orthopedics;  Laterality: N/A;   COLONOSCOPY     COLONOSCOPY WITH PROPOFOL N/A 11/22/2013   Dr. Gala Romney: Grade 3 and 4/internal hemorrhoids?"likely source of hematochezia Normal colonoscopy otherwise.    COLONOSCOPY WITH PROPOFOL N/A 09/15/2017   Dr. Emerson Monte: Hemorrhoids, next colonoscopy 10 years   ESOPHAGOGASTRODUODENOSCOPY (EGD) WITH PROPOFOL N/A 11/22/2013   Dr. Gala Romney: Incomplete Schatzki's ring dilated and disrupted as described above.  Small hiatal hernia. Focally abnormal gastric mucosa of uncertain significance status post biopsy, reactive gastropathy, negative H.pylori   ESOPHAGOGASTRODUODENOSCOPY (EGD) WITH PROPOFOL N/A 08/04/2015   Dr. Gala Romney: mild Schatzki's ring s/p dilation, small hiatal hernia    ESOPHAGOGASTRODUODENOSCOPY (EGD) WITH PROPOFOL N/A 03/17/2017   Mild Schatki's ring s/p dilation, small hiatal hernia, otherwise normal   ESOPHAGOGASTRODUODENOSCOPY (EGD) WITH PROPOFOL N/A 09/18/2018   Dr. Gala Romney: Schatzki ring status post  dilation and disruption.  Hiatal hernia.   ESOPHAGOGASTRODUODENOSCOPY (EGD) WITH PROPOFOL N/A 12/20/2019   non-obstructing Schatzki's ring s/p dilation with 56, 58, and 35 F.   EXCISION VAGINAL CYST Left 06/20/2012   Procedure: EXCISION LEFT LABIAL CYST;  Surgeon: Jonnie Kind, MD;  Location: [facility];  Service: Gynecology;  Laterality: Left;   LOBECTOMY Left    LUNG CANCER SURGERY  1997   age 48, resection only   MALONEY DILATION N/A 11/22/2013   Procedure: MALONEY DILATION;  Surgeon: Daneil Dolin, MD;  Location: [facility];  Service: Endoscopy;  Laterality: N/A;  [place] N/A 08/04/2015   Procedure: [name] DILATION;  Surgeon: Daneil Dolin, MD;  [facility];  Service: Endoscopy;  Laterality: N/A;   MALONEY DILATION N/A 03/17/2017   Procedure: [name] DILATION;  Surgeon: Daneil Dolin, MD;  [facility];  Service: Endoscopy;  Laterality: N/A;   MALONEY DILATION N/A 09/18/2018   Procedure: [name] DILATION;  Surgeon: Daneil Dolin, MD;  [facility];  Service: Endoscopy;  Laterality: N/A;   MALONEY DILATION N/A 12/20/2019   Procedure: [name] DILATION;  Surgeon: Daneil Dolin, MD;  [facility];  Service: Endoscopy;  Laterality: N/A;   MASS EXCISION N/A 07/23/2014   Procedure: EXCISIONAL BIOPSY NODULE LEFT PARACOCCYGEAL AREA;  Surgeon: Jessy Oto, MD;  Location: [place];  Service: Orthopedics;  Laterality: N/A;   MASS EXCISION Left 10/10/2015   Procedure: EXCISIONAL BIOPSY OF LEFT DORSORADIAL FOREARM MASS LIPOMA;  Surgeon: Jessy Oto, MD;  Location: [place];  Service: Orthopedics;  Laterality: Left;   TUBAL LIGATION       OB History     Gravida  3   Para  3   Term  3   Preterm      AB      Living  3      SAB      IAB      Ectopic      Multiple      Live Births              Family History  Problem Relation Age of Onset   Diabetes Other    Heart disease Other        has  pace maker   Hypertension Mother    Kidney disease Mother    Hypertension Father    Kidney disease Father    Heart disease Father    Cancer Father        leukemia   Hypertension Sister    Hypertension Sister    Colon cancer Neg Hx     Social History   Tobacco Use   Smoking status: Former    Packs/day: 1.50    Years: 25.00    Pack years: 37.50    Types: Cigarettes    Quit date: 04/07/1995    Years since quitting: 25.6   Smokeless tobacco: Never  Vaping Use   Vaping Use: Never used  Substance Use Topics   Alcohol use: No   Drug use: No    Home Medications Prior to Admission medications   Medication Sig Start Date End Date Taking? Authorizing Provider  cephALEXin (KEFLEX) 500 MG capsule Take 1 capsule (500 mg total) by mouth 4 (four) times daily for 7 days. 11/11/20 11/18/20 Yes Lexus Barletta S, PA-C  predniSONE (STERAPRED UNI-PAK 21 TAB) 10 MG (21) TBPK tablet Take by mouth daily. Take 6 tabs by mouth daily  for 2 days, then 5 tabs for 2 days, then 4 tabs for 2 days, then 3 tabs for 2 days, 2 tabs for 2 days, then 1 tab by mouth daily for 2 days 11/11/20  Yes Lindon Kiel S, PA-C  acyclovir (ZOVIRAX) 400 MG tablet Take 1 tablet by mouth twice daily 10/25/20   Ezequiel Essex, MD  albuterol (PROVENTIL) (2.5 MG/3ML) 0.083% nebulizer solution USE 1 VIAL IN NEBULIZER EVERY 6 HOURS AS NEEDED FOR WHEEZING AND FOR SHORTNESS OF BREATH 05/13/20   Shary Key, DO  amitriptyline (ELAVIL) 150 MG tablet Take 1 tablet by mouth once daily 09/03/20   Shary Key, DO  amLODipine (NORVASC) 10 MG tablet Take 1 tablet by mouth once daily 08/11/20   Shary Key, DO  atorvastatin (LIPITOR) 40 MG tablet Take 1 tablet by mouth once daily 09/23/20   Shary Key, DO  budesonide-formoterol (SYMBICORT) 160-4.5 MCG/ACT inhaler Inhale 2 puffs into the lungs 2 (two) times daily. Patient taking differently: Inhale 2  puffs into the lungs 2 (two) times daily as needed (respiratory  issues.). 12/15/17   Barton Dubois, MD  celecoxib (CELEBREX) 200 MG capsule Take 1 capsule by mouth once daily 09/02/20   Shary Key, DO  DEXILANT 60 MG capsule TAKE 1 CAPSULE BY MOUTH ONCE DAILY WITH BREAKFAST 07/22/20   Annitta Needs, NP  diclofenac Sodium (VOLTAREN) 1 % GEL Apply 2 g topically 4 (four) times daily. 06/20/20   Jessy Oto, MD  DULoxetine (CYMBALTA) 60 MG capsule Take 1 capsule by mouth twice daily 09/09/20   Shary Key, DO  LINZESS 290 MCG CAPS capsule TAKE 1 CAPSULE BY MOUTH ONCE DAILY BEFORE BREAKFAST 10/17/20   Shary Key, DO  Menthol, Topical Analgesic, (BIOFREEZE) 4 % GEL [place] to area of discomfort left posterior upper neck. 06/20/20   Jessy Oto, MD  pregabalin (LYRICA) 100 MG capsule TAKE 1 CAPSULE BY MOUTH THREE TIMES DAILY 10/25/20   Ezequiel Essex, MD    Allergies    Promethazine hcl  Review of Systems   Review of Systems  Constitutional:  Positive for fever.  HENT:  Negative for ear pain and sore throat.   Eyes:  Negative for visual disturbance.  Respiratory:  Positive for cough and shortness of breath (chronic).   Cardiovascular:  Negative for chest pain.  Gastrointestinal:  Negative for abdominal pain, constipation, diarrhea, nausea and vomiting.  Genitourinary:  Negative for dysuria and hematuria.  Musculoskeletal:  Negative for back pain.  Skin:  Positive for rash.  Neurological:  Negative for headaches.  All other systems reviewed and are negative.  Physical Exam Updated Vital Signs BP 129/66 (BP Location: Right Arm)   Pulse 79   Temp 97.7 F (36.5 C) (Oral)   Resp 14   Ht 5\' 5"  (1.651 m)   Wt 81.2 kg   SpO2 94%   BMI 29.79 kg/m   Physical Exam Vitals and nursing note reviewed.  Constitutional:      General: She is not in acute distress.    Appearance: She is well-developed.  HENT:     Head: Normocephalic and atraumatic.  Eyes:     Conjunctiva/sclera: Conjunctivae normal.  Cardiovascular:     Rate and Rhythm:  Normal rate and regular rhythm.     Heart sounds: No murmur heard. Pulmonary:     Effort: Pulmonary effort is normal. No respiratory distress.     Breath sounds: Wheezing present.  Abdominal:     General: Bowel sounds are normal.     Palpations: Abdomen is soft.     Tenderness: There is no abdominal tenderness.  Musculoskeletal:        General: Normal range of motion.     Cervical back: Neck supple.  Skin:    General: Skin is warm and dry.     Findings: Rash present.     Comments: Papular skin colored areas noted to the bilat feet, rue, abdomen. She has a scab to the right neck. All of the areas of concern are tender to palpation. I do not see any lesions to the palms or soles.  Neurological:     Mental Status: She is alert.        ED Results / Procedures / Treatments   Labs (all labs ordered are listed, but only abnormal results are displayed) Labs Reviewed  CBC WITH DIFFERENTIAL/PLATELET - Abnormal; Notable for the following components:      Result Value   WBC 3.1 (*)    Neutro  Abs 1.0 (*)    All other components within normal limits  COMPREHENSIVE METABOLIC PANEL - Abnormal; Notable for the following components:   Glucose, Bld 106 (*)    Calcium 8.6 (*)    Alkaline Phosphatase 199 (*)    All other components within normal limits  URINALYSIS, ROUTINE W REFLEX MICROSCOPIC - Abnormal; Notable for the following components:   APPearance HAZY (*)    Hgb urine dipstick SMALL (*)    Protein, ur 30 (*)    Bacteria, UA RARE (*)    All other components within normal limits  SEDIMENTATION RATE - Abnormal; Notable for the following components:   Sed Rate 37 (*)    All other components within normal limits  RESP PANEL BY RT-PCR (FLU A&B, COVID) ARPGX2  C-REACTIVE PROTEIN  RPR  HIV ANTIBODY (ROUTINE TESTING W REFLEX)    EKG None  Radiology No results found.  Procedures Procedures   Medications Ordered in ED Medications  acetaminophen (TYLENOL) tablet 650 mg (650 mg  Oral Given 11/11/20 1325)    ED Course  I have reviewed the triage vital signs and the nursing notes.  Pertinent labs & imaging results that were available during my care of the patient were reviewed by me and considered in my medical decision making (see chart for details).    MDM Rules/Calculators/A&P                          Cause of rash is currently unknown however my suspicion is that this could be a drug related vasculitis. She has discontinued taking the bactrim. She has no signs of anaphylaxis at this time. no warmth, no draining sinus tracts, no superficial abscesses, no bullous impetigo, no vesicles, no desquamation, no target lesions with dusky purpura or a central bulla. Not tender to touch.No concern for SJS, TEN, TSS, tick borne illness, or other life-threatening condition. Her lab work is significant only for a mild leukopenia and an elevated sed rate. Her rpr/hiv and covid are pending at the time of discharge. She will f/u covid on her mychart. Will discharge home with short course of steroids and will cover her with keflex for possible infection and because she did not finish her full treatment for her uti. She is given information to f/u with dermatology and is advised to return to the ed for reassessment in 48 hours if she has no improvement of her symptoms or if symptoms are worsening. She voices understanding of the plan and reasons to return. All questions answered, pt stable for discharge.   Pt was seen in conjunction with Dr. Dina Rich who personally evaluated the patient and is in agreement with the plan.   Final Clinical Impression(s) / ED Diagnoses Final diagnoses:  Rash    Rx / DC Orders ED Discharge Orders          Ordered    cephALEXin (KEFLEX) 500 MG capsule  4 times daily        11/11/20 1347    predniSONE (STERAPRED UNI-PAK 21 TAB) 10 MG (21) TBPK tablet  Daily        11/11/20 1347             Tiawanna Luchsinger S, PA-C 11/11/20 [place],  [place], DO 11/12/20 [phone]

## 2020-11-11 NOTE — Discharge Instructions (Signed)
Take antibiotics and steroids as directed   You should call the dermatology office to schedule an appointment within the next week.   You will need to follow up in 48 hours if your symptoms are not improving or are worsening.

## 2020-11-12 LAB — RPR: RPR Ser Ql: NONREACTIVE

## 2020-11-14 ENCOUNTER — Other Ambulatory Visit: Payer: Self-pay | Admitting: Family Medicine

## 2020-11-14 DIAGNOSIS — I10 Essential (primary) hypertension: Secondary | ICD-10-CM

## 2020-11-19 ENCOUNTER — Other Ambulatory Visit: Payer: Self-pay | Admitting: Family Medicine

## 2020-11-20 ENCOUNTER — Encounter: Payer: Self-pay | Admitting: Family Medicine

## 2020-11-20 ENCOUNTER — Other Ambulatory Visit: Payer: Self-pay

## 2020-11-20 ENCOUNTER — Ambulatory Visit (INDEPENDENT_AMBULATORY_CARE_PROVIDER_SITE_OTHER): Payer: Medicare HMO | Admitting: Family Medicine

## 2020-11-20 VITALS — BP 153/62 | HR 88 | Ht 65.0 in | Wt 177.8 lb

## 2020-11-20 DIAGNOSIS — E663 Overweight: Secondary | ICD-10-CM

## 2020-11-20 DIAGNOSIS — R7303 Prediabetes: Secondary | ICD-10-CM

## 2020-11-20 DIAGNOSIS — R300 Dysuria: Secondary | ICD-10-CM | POA: Diagnosis not present

## 2020-11-20 DIAGNOSIS — N750 Cyst of Bartholin's gland: Secondary | ICD-10-CM | POA: Diagnosis not present

## 2020-11-20 LAB — POCT UA - MICROSCOPIC ONLY

## 2020-11-20 LAB — POCT URINALYSIS DIP (MANUAL ENTRY)
Bilirubin, UA: NEGATIVE
Glucose, UA: NEGATIVE mg/dL
Ketones, POC UA: NEGATIVE mg/dL
Leukocytes, UA: NEGATIVE
Nitrite, UA: NEGATIVE
Protein Ur, POC: NEGATIVE mg/dL
Spec Grav, UA: 1.015 (ref 1.010–1.025)
Urobilinogen, UA: 0.2 E.U./dL
pH, UA: 7 (ref 5.0–8.0)

## 2020-11-20 LAB — POCT GLYCOSYLATED HEMOGLOBIN (HGB A1C): Hemoglobin A1C: 5.8 % — AB (ref 4.0–5.6)

## 2020-11-20 NOTE — Progress Notes (Signed)
    SUBJECTIVE:   CHIEF COMPLAINT / HPI:   Endorses vaginal cyst that started over a month that seems to be getting bigger. Denies drainage. Denies fever, chills. No history of vaginal cyst prior. Has not tried anything at home. She also endorses some burning when she urinates. Denies frequency or urgency.    OBJECTIVE:   BP (!) 153/62   Pulse 88   Ht 5\' 5"  (1.651 m)   Wt 177 lb 12.8 oz (80.6 kg)   SpO2 100%   BMI 29.59 kg/m    Physical exam  General: well appearing, NAD Cardiovascular: RRR, no murmurs Lungs: CTAB. Normal WOB Abdomen: soft, non-distended, non-tender Skin: warm, dry. No edema GU: bartholin cyst on R side of labia minora, approx 8 o clock position. Tender to palpation. No drainage appreciated.   ASSESSMENT/PLAN:   No problem-specific Assessment & Plan notes found for this encounter.   Bartholin cyst  Present for about 1 month that is progressing in size. Located on R side of labia minora, approx 8 o clock position. Tender to palpation, without drainage. Discussed trying conservative management first with sitz baths multiple times a day. If does not improve or drain on its own, recommend Gyn referral for drainage.   Dysuria Without urgency or frequency. Likely cyst contributing to burning but will check UA to rule out UTI.   Prediabetes A1c 5.8 today. Discussed maintaining healthy diet and physical exercise.   [place]

## 2020-11-20 NOTE — Patient Instructions (Addendum)
It was great seeing you today!  Today you came in for vaginal cyst, which I believe is Bartholin cyst (information below).  I recommend soaking in a sitz bath a few times a day over the next week.  If the cyst does not drain on its own please call us at the clinic and we can either refer you to Gynecology or our Derm clinic to drain the cyst.   Today we also checked your urine for infection and your A1c to check for diabetes.  Your urine did not show infection.   Your A1c is 5.8 so in the prediabetic range. It is important to maintain a healthy diet of lean meats, vegetables, and complex carbs (fruit, brown breads and rice, etc) cutting out on sugary beverages and foods, simple carbs, fried foods.   Feel free to call with any questions or concerns at any time, at [phone].   Take care,  Dr. Shary Key [facility]   Bartholin's Cyst A Bartholin's cyst is a fluid-filled sac that forms on a Bartholin's gland. Bartholin's glands are small glands in the folds of skin near the opening of the vagina (labia). This type of cyst causes a bulge or lump near the opening of the vagina. If you have a cyst that is small and not infected, you may be able to take care of it at home. If your cyst gets infected, it may cause pain and your doctor may need to drain it. What are the causes? This condition may be caused by a blocked Bartholin's gland. Germs (bacteria) inside of the cyst can cause an infection. What are the signs or symptoms? A bulge or lump near the opening of the vagina. Discomfort or pain. Redness, swelling, or fluid draining from the area. How is this treated? You may not need treatment if your cyst is not causing symptoms. The cyst can go away on its own with home care. Home care includes hot baths or heat therapy. Large cysts or cysts that are infected may be treated with: Antibiotic medicine. A procedure to drain the fluid. Cysts that keep coming back  will need to be drained many times. Your doctor may talk to you about surgery to remove the cyst. Follow these instructions at home: Medicines Take over-the-counter and prescription medicines only as told by your doctor. If you were prescribed an antibiotic medicine, take it as told by your doctor. Do not stop taking it even if you start to feel better. Managing pain and swelling Try sitz baths to help with pain and swelling. A sitz bath is a warm water bath in which the water only comes up to your hips and should cover your buttocks. You may take sitz baths a few times a day. If told, put heat on the affected area as often as needed. Use the heat source that your doctor recommends, such as a moist heat pack or a heating pad. Place a towel between your skin and the heat source. Leave the heat on for 20-30 minutes. Take off the heat if your skin turns bright red. This is very important. If you cannot feel pain, heat, or cold, you have a greater risk of getting burned. General instructions If your cyst was drained: Follow instructions from your doctor about how to take care of your wound. Use feminine pads to absorb any fluid. Do not push on or squeeze your cyst. Do not have sex until the cyst has gone away or your wound  from drainage has healed. Take these steps to help prevent a cyst from returning, and to prevent other cysts from forming: Take a bath or shower once a day. Clean the area around your vagina with mild soap and water when you bathe. Practice safe sex to prevent STIs. Talk with your doctor about how to prevent STIs and which forms of birth control to use. Keep all follow-up visits. Contact a doctor if: You have a fever. You get more redness, swelling, or pain around your cyst. You have fluid, blood, pus, or a bad smell coming from your cyst. You have a cyst that gets larger or a cyst that comes back. Summary A Bartholin's cyst is a fluid-filled sac that forms on a Bartholin's  gland. These small glands are found in the folds of skin near the opening of the vagina (labia). This type of cyst causes a bulge or lump near the opening of the vagina. Try sitz baths a few times a day to help with pain and swelling. Do not push on or squeeze your cyst. This information is not intended to replace advice given to you by your health care provider. Make sure you discuss any questions you have with your health care provider. Document Revised: 06/04/2019 Document Reviewed: 06/04/2019 Elsevier Patient Education  [place].

## 2020-12-03 ENCOUNTER — Ambulatory Visit: Payer: Medicare HMO | Admitting: Cardiology

## 2020-12-17 ENCOUNTER — Other Ambulatory Visit: Payer: Self-pay | Admitting: Family Medicine

## 2020-12-19 ENCOUNTER — Other Ambulatory Visit: Payer: Self-pay | Admitting: Family Medicine

## 2020-12-26 ENCOUNTER — Telehealth: Payer: Self-pay | Admitting: *Deleted

## 2020-12-26 NOTE — Telephone Encounter (Signed)
Patient made an appt with access to care on Tuesday 12/30/20.  She wanted to let her pcp know that she has been experiencing vaginal itching since taking an abx prior to thanksgiving.  She would like to try medication for a yeast infection but made the appt because she knows that she needed to evaluated.  Will forward to MD as an FYI.  Tamario Heal,CMA

## 2020-12-30 ENCOUNTER — Other Ambulatory Visit: Payer: Self-pay

## 2020-12-30 ENCOUNTER — Ambulatory Visit (INDEPENDENT_AMBULATORY_CARE_PROVIDER_SITE_OTHER): Payer: Medicare HMO | Admitting: Family Medicine

## 2020-12-30 VITALS — BP 138/79 | HR 89 | Ht 65.0 in | Wt 179.6 lb

## 2020-12-30 DIAGNOSIS — N898 Other specified noninflammatory disorders of vagina: Secondary | ICD-10-CM

## 2020-12-30 MED ORDER — FLUCONAZOLE 150 MG PO TABS: 150.0000 mg | ORAL_TABLET | Freq: Once | ORAL | 0 refills | Status: AC

## 2020-12-30 NOTE — Progress Notes (Signed)
     SUBJECTIVE:   CHIEF COMPLAINT / HPI:   [name] is a 60 year old who presents for vaginal itching for the past month.  She states it started after taking antibiotics.  Endorses having a yeast infection before after taking an antibiotic.  Denies discharge, and denies any concern for STDs.  Denies rash or bumps. Not currently sexually active.   OBJECTIVE:   BP 138/79   Pulse 89   Ht 5\' 5"  (1.651 m)   Wt 179 lb 9.6 oz (81.5 kg)   SpO2 97%   BMI 29.89 kg/m    Physical exam  General: well appearing, NAD Cardiovascular: RRR, no murmurs Lungs: mild expiratory wheezing diffusely. Normal WOB Abdomen: soft, non-distended  Skin: warm, dry.   ASSESSMENT/PLAN:   No problem-specific Assessment & Plan notes found for this encounter.   Vaginal itching For 1 month, occurring after taking an antibiotic.  She declines wanting a pelvic exam today, stating that she has had a yeast infection after taking antibiotic in the past.  We will treat with Diflucan, but gave strict return precautions if this does not improve that we would do a pelvic exam to explore other potential causes such as vaginal atrophy, STIs, vulvar dermatitis. Patient agreeable with plan.    [place]

## 2020-12-30 NOTE — Patient Instructions (Addendum)
It was great seeing you today!  You came in for vaginal itching after taking antibiotics and I am prescribing Diflucan to take once for yeast infection. If you still have itching after taking it please come back in so we can do a full exam.   Feel free to call with any questions or concerns at any time, at [phone].   Take care,  Dr. Shary Key [facility]

## 2021-01-16 ENCOUNTER — Other Ambulatory Visit: Payer: Self-pay | Admitting: Family Medicine

## 2021-01-22 ENCOUNTER — Telehealth: Payer: Self-pay | Admitting: Family Medicine

## 2021-01-22 NOTE — Telephone Encounter (Signed)
Pt states the pharmacy at [facility] in [place] said they have faxed several times and no response.  Out of Linzess has one pill left for tomorrow.  Please call patient @ [phone]

## 2021-01-22 NOTE — Telephone Encounter (Signed)
Pt stated the pharmacy at [facility] in [place] said they have faxed several times and no response. Pt stated she is out of Linzess only on pill left for tomorrow Please call Pt. @ [phone]

## 2021-01-29 ENCOUNTER — Ambulatory Visit (INDEPENDENT_AMBULATORY_CARE_PROVIDER_SITE_OTHER): Payer: Medicare PPO | Admitting: Cardiology

## 2021-01-29 ENCOUNTER — Encounter: Payer: Self-pay | Admitting: Cardiology

## 2021-01-29 VITALS — BP 128/60 | Temp 92.0°F | Ht 65.0 in | Wt 174.4 lb

## 2021-01-29 DIAGNOSIS — I10 Essential (primary) hypertension: Secondary | ICD-10-CM | POA: Diagnosis not present

## 2021-01-29 DIAGNOSIS — Z87898 Personal history of other specified conditions: Secondary | ICD-10-CM | POA: Diagnosis not present

## 2021-01-29 DIAGNOSIS — R002 Palpitations: Secondary | ICD-10-CM | POA: Diagnosis not present

## 2021-01-29 DIAGNOSIS — I351 Nonrheumatic aortic (valve) insufficiency: Secondary | ICD-10-CM | POA: Diagnosis not present

## 2021-01-29 MED ORDER — FUROSEMIDE 20 MG PO TABS: 20.0000 mg | ORAL_TABLET | ORAL | 3 refills | Status: DC | PRN

## 2021-01-29 NOTE — Patient Instructions (Signed)
Medication Instructions:  Begin Lasix 20mg  as needed for swelling Continue all other medications.     Labwork: none  Testing/Procedures: none  Follow-Up: 6 months   Any Other Special Instructions Will Be Listed Below (If Applicable).   If you need a refill on your cardiac medications before your next appointment, please call your pharmacy.

## 2021-01-29 NOTE — Progress Notes (Signed)
Clinical Summary [name] is a 61 y.o.female seen today for follow up of the following medical problems.   1. Syncope 10/2017 echo LVEF 60-65%, grade II diastoilc dysfunction, - 10/2017 holter: PACs, short runs atach. No sustained arrhythmias   - recent episodes started in 10/2017 - last episode 3 weeks ago at home watching tv. Mild lightheadedness before, next thing she knew her husband was waking her up - her sister recalls an episode while watching tv while and talking to family. No prodrome, just slumped down in chair. Out for 5-6 minutes.  - has had some prior episodes with falls.       02/2018 30 day event monitor was benign   - no recent episodes     2. Diastolic dysfunction - 81/0175 echo LVEF 60-65%, grade II diastolic dysfunction   01/256 echo LVEF 60-65%, grade I dd, normal RV function - some LE edema at times. Comes and goes.      3. Aortic regurgitation - 10/2017 moderate AI    04/2020 echo moderate AI   4. HTN - compliant with meds but has not taken yet this morning.  - has had some orthostaitc changes in clinic, avoiding being too aggressive with bp   5. Palpitations - history of anxiety - Palpitations last about 1 minute. Occurs 1-2 times per week - mountain dew up to 6 cans or more daily   Past Medical History:  Diagnosis Date   Anxiety    Asthma    every now and then.  Wheezing and coughing   Cancer ([place]) 1997   left lung   Chronic abdominal pain    Chronic back pain    Chronic joint pain    Depression    HLD (hyperlipidemia)    Palpitations 01/2013   chronic, intermittent   Shortness of breath dyspnea    with exertion     Allergies  Allergen Reactions   Promethazine Hcl Nausea And Vomiting and Other (See Comments)    "Makes my head hurt really bad too"     Current Outpatient Medications  Medication Sig Dispense Refill   acyclovir (ZOVIRAX) 400 MG tablet Take 1 tablet by mouth twice daily 180 tablet 0   albuterol  (PROVENTIL) (2.5 MG/3ML) 0.083% nebulizer solution USE 1 VIAL IN NEBULIZER EVERY 6 HOURS AS NEEDED FOR WHEEZING AND FOR SHORTNESS OF BREATH 75 mL 0   amitriptyline (ELAVIL) 150 MG tablet Take 1 tablet by mouth once daily 90 tablet 0   amLODipine (NORVASC) 10 MG tablet Take 1 tablet by mouth once daily 90 tablet 0   atorvastatin (LIPITOR) 40 MG tablet Take 1 tablet by mouth once daily 90 tablet 3   budesonide-formoterol (SYMBICORT) 160-4.5 MCG/ACT inhaler Inhale 2 puffs into the lungs 2 (two) times daily. (Patient taking differently: Inhale 2 puffs into the lungs 2 (two) times daily as needed (respiratory issues.).) 1 Inhaler 3   celecoxib (CELEBREX) 200 MG capsule Take 1 capsule by mouth once daily 60 capsule 0   DEXILANT 60 MG capsule TAKE 1 CAPSULE BY MOUTH ONCE DAILY WITH BREAKFAST 90 capsule 3   diclofenac Sodium (VOLTAREN) 1 % GEL Apply 2 g topically 4 (four) times daily. 350 g 3   DULoxetine (CYMBALTA) 60 MG capsule Take 1 capsule by mouth twice daily 120 capsule 0   LINZESS 290 MCG CAPS capsule TAKE 1 CAPSULE BY MOUTH ONCE DAILY BEFORE BREAKFAST 30 capsule 0   Menthol, Topical Analgesic, (BIOFREEZE) 4 % GEL [place] to  area of discomfort left posterior upper neck. 74 mL 3   predniSONE (STERAPRED UNI-PAK 21 TAB) 10 MG (21) TBPK tablet Take by mouth daily. Take 6 tabs by mouth daily  for 2 days, then 5 tabs for 2 days, then 4 tabs for 2 days, then 3 tabs for 2 days, 2 tabs for 2 days, then 1 tab by mouth daily for 2 days 42 tablet 0   pregabalin (LYRICA) 100 MG capsule TAKE 1 CAPSULE BY MOUTH THREE TIMES DAILY 90 capsule 0   No current facility-administered medications for this visit.     Past Surgical History:  Procedure Laterality Date   ABDOMINAL HYSTERECTOMY     ABDOMINAL SURGERY     ovarian cyst removal   BIOPSY N/A 11/22/2013   Procedure: GASTRIC BIOPSY;  Surgeon: Daneil Dolin, MD;  Location: [facility];  Service: Endoscopy;  Laterality: N/A;   BLADDER SURGERY  suspension x4   x 4    CARPAL TUNNEL RELEASE Right 07/11/2015   Procedure: RIGHT CARPAL TUNNEL RELEASE;  Surgeon: Jessy Oto, MD;  Location: [facility];  Service: Orthopedics;  Laterality: Right;   CHOLECYSTECTOMY N/A 04/05/2014   Procedure: LAPAROSCOPIC CHOLECYSTECTOMY;  Surgeon: Aviva Signs Md, MD;  Location: [facility];  Service: General;  Laterality: N/A;   COCCYGECTOMY N/A 04/11/2015   Procedure: COCCYGECTOMY WITH OSTEOTOMY THROUGH AREA OF DEFORMITY;  Surgeon: Jessy Oto, MD;  Location: [facility];  Service: Orthopedics;  Laterality: N/A;   COLONOSCOPY     COLONOSCOPY WITH PROPOFOL N/A 11/22/2013   Dr. Gala Romney: Grade 3 and 4/internal hemorrhoidslikely source of hematochezia Normal colonoscopy otherwise.    COLONOSCOPY WITH PROPOFOL N/A 09/15/2017   Dr. Emerson Monte: Hemorrhoids, next colonoscopy 10 years   ESOPHAGOGASTRODUODENOSCOPY (EGD) WITH PROPOFOL N/A 11/22/2013   Dr. Gala Romney: Incomplete Schatzki's ring dilated and disrupted as described above.  Small hiatal hernia. Focally abnormal gastric mucosa of uncertain significance status post biopsy, reactive gastropathy, negative H.pylori   ESOPHAGOGASTRODUODENOSCOPY (EGD) WITH PROPOFOL N/A 08/04/2015   Dr. Gala Romney: mild Schatzki's ring s/p dilation, small hiatal hernia    ESOPHAGOGASTRODUODENOSCOPY (EGD) WITH PROPOFOL N/A 03/17/2017   Mild Schatki's ring s/p dilation, small hiatal hernia, otherwise normal   ESOPHAGOGASTRODUODENOSCOPY (EGD) WITH PROPOFOL N/A 09/18/2018   Dr. Gala Romney: Schatzki ring status post dilation and disruption.  Hiatal hernia.   ESOPHAGOGASTRODUODENOSCOPY (EGD) WITH PROPOFOL N/A 12/20/2019   non-obstructing Schatzki's ring s/p dilation with 56, 58, and 68 F.   EXCISION VAGINAL CYST Left 06/20/2012   Procedure: EXCISION LEFT LABIAL CYST;  Surgeon: Jonnie Kind, MD;  Location: [facility];  Service: Gynecology;  Laterality: Left;   LOBECTOMY Left    LUNG CANCER SURGERY  1997   age 72, resection only   MALONEY DILATION N/A 11/22/2013    Procedure: MALONEY DILATION;  Surgeon: Daneil Dolin, MD;  Location: [facility];  Service: Endoscopy;  Laterality: N/A;  [place] N/A 08/04/2015   Procedure: [name] DILATION;  Surgeon: Daneil Dolin, MD;  [facility];  Service: Endoscopy;  Laterality: N/A;   MALONEY DILATION N/A 03/17/2017   Procedure: [name] DILATION;  Surgeon: Daneil Dolin, MD;  [facility];  Service: Endoscopy;  Laterality: N/A;   MALONEY DILATION N/A 09/18/2018   Procedure: [name] DILATION;  Surgeon: Daneil Dolin, MD;  [facility];  Service: Endoscopy;  Laterality: N/A;   MALONEY DILATION N/A 12/20/2019   Procedure: [name] DILATION;  Surgeon: Daneil Dolin, MD;  [facility];  Service: Endoscopy;  Laterality: N/A;   MASS EXCISION N/A 07/23/2014   Procedure: EXCISIONAL BIOPSY NODULE LEFT PARACOCCYGEAL AREA;  Surgeon: Jessy Oto, MD;  Location: [facility];  Service: Orthopedics;  Laterality: N/A;   MASS EXCISION Left 10/10/2015   Procedure: EXCISIONAL BIOPSY OF LEFT DORSORADIAL FOREARM MASS LIPOMA;  Surgeon: Jessy Oto, MD;  Location: [facility];  Service: Orthopedics;  Laterality: Left;   TUBAL LIGATION       Allergies  Allergen Reactions   Promethazine Hcl Nausea And Vomiting and Other (See Comments)    "Makes my head hurt really bad too"      Family History  Problem Relation Age of Onset   Diabetes Other    Heart disease Other        has pace maker   Hypertension Mother    Kidney disease Mother    Hypertension Father    Kidney disease Father    Heart disease Father    Cancer Father        leukemia   Hypertension Sister    Hypertension Sister    Colon cancer Neg Hx      Social History [name] reports that she quit smoking about 25 years ago. Her smoking use included cigarettes. She has a 37.50 pack-year smoking history. She has never used smokeless tobacco. [name] reports no history of alcohol use.   Review of  Systems CONSTITUTIONAL: No weight loss, fever, chills, weakness or fatigue.  HEENT: Eyes: No visual loss, blurred vision, double vision or yellow sclerae.No hearing loss, sneezing, congestion, runny nose or sore throat.  SKIN: No rash or itching.  CARDIOVASCULAR: per hpi RESPIRATORY: No shortness of breath, cough or sputum.  GASTROINTESTINAL: No anorexia, nausea, vomiting or diarrhea. No abdominal pain or blood.  GENITOURINARY: No burning on urination, no polyuria NEUROLOGICAL: No headache, dizziness, syncope, paralysis, ataxia, numbness or tingling in the extremities. No change in bowel or bladder control.  MUSCULOSKELETAL: No muscle, back pain, joint pain or stiffness.  LYMPHATICS: No enlarged nodes. No history of splenectomy.  PSYCHIATRIC: No history of depression or anxiety.  ENDOCRINOLOGIC: No reports of sweating, cold or heat intolerance. No polyuria or polydipsia.  Marland Kitchen   Physical Examination Today's Vitals   01/29/21 0815  BP: 140/60  Temp: (!) 92 F (33.3 C)  SpO2: 99%  Weight: 174 lb 6.4 oz (79.1 kg)  Height: 5\' 5"  (1.651 m)   Body mass index is 29.02 kg/m.  Gen: resting comfortably, no acute distress HEENT: no scleral icterus, pupils equal round and reactive, no palptable cervical adenopathy,  CV: RRR, no m/r/g no jvd Resp: Clear to auscultation bilaterally GI: abdomen is soft, non-tender, non-distended, normal bowel sounds, no hepatosplenomegaly MSK: extremities are warm, no edema.  Skin: warm, no rash Neuro:  no focal deficits Psych: appropriate affect   Diagnostic Studies 02/2018 heart monitor 30 day event monitor. Data only available from 64% of planned monitored time Min HR 64, Max HR 135, Avg HR 90 No symptoms reported Tracings show sinus rhythm, no significant arrhythmias  04/2020 echo IMPRESSIONS     1. Left ventricular ejection fraction, by estimation, is 60 to 65%. The  left ventricle has normal function. The left ventricle has no regional  wall  motion abnormalities. Left ventricular diastolic parameters are  consistent with Grade I diastolic  dysfunction (impaired relaxation).   2. Right ventricular systolic function is normal. The right ventricular  size is normal.   3.  The mitral valve is normal in structure. No evidence of mitral valve  regurgitation. No evidence of mitral stenosis.   4. The aortic valve is tricuspid. Aortic valve regurgitation is moderate.  No aortic stenosis is present.   5. The inferior vena cava is normal in size with greater than 50%  respiratory variability, suggesting right atrial pressure of 3 mmHg.   Assessment and Plan  1. Syncope - unclear etiology - no evidence of cardiac etiology by echo or monitor  -no recent episodes, continue to monitor. If recurrence depending on description could consider loop recorder.    2. Aortic regurgitation -moderate by recent echo, reopeat echo 2 years   3. HTN -manual recheck is at goal, continue current meds  4. Palpitations - monitor symptoms with weaning caffeine, if ongoign could add low dose beta blocker - 2020 monitor was benign.  - anxiety likely playing some role, she is going to discuss management with pcp - EKG today shows NSR  5. Diastoilc dysfunction - mild LE edema at times, start lasix 20mg  prn     Arnoldo Lenis, M.D

## 2021-01-30 ENCOUNTER — Telehealth: Payer: Self-pay

## 2021-01-30 NOTE — Telephone Encounter (Signed)
Patient calls nurse line reporting continued vaginal irritation. Patient reports she took #1 Diflucan in December, however feels that never "really" worked. Patient reports she has been scratching so much her vulva is swollen. Patient denies any sores or pain in the area. Patient is requesting an external cream to help alleviate symptoms. Patient advised she may need to be reevaluated, however will forward to PCP.

## 2021-02-02 ENCOUNTER — Other Ambulatory Visit: Payer: Self-pay | Admitting: Family Medicine

## 2021-02-02 DIAGNOSIS — A609 Anogenital herpesviral infection, unspecified: Secondary | ICD-10-CM

## 2021-02-03 ENCOUNTER — Other Ambulatory Visit: Payer: Self-pay

## 2021-02-05 NOTE — Telephone Encounter (Signed)
Patient calling again to check the status of external cream or second round of diflucan.

## 2021-02-06 NOTE — Patient Instructions (Addendum)
It was wonderful to see you today.  Please bring ALL of your medications with you to every visit.   Today we talked about:  We swabbed you to check for a yeast infection and bacterial vaginosis.   We will treat you with a strong steroid cream which should relieve the itching sensation.  He uses twice a day for 2 weeks.  After that, we can consider transitioning you to a less potent steroid cream.   Thank you for choosing [facility].   Please call [phone] with any questions about today's appointment.  Please be sure to schedule follow up at the front  desk before you leave today.   Sharion Settler, DO PGY-2 Family Medicine

## 2021-02-06 NOTE — Progress Notes (Signed)
° ° °  SUBJECTIVE:   CHIEF COMPLAINT / HPI:   Vaginal Itching  [name] is a 61 y.o. female who presents to the [facility] clinic today for concerns of vaginal itching since October 2022.  Patient notes that she started on antibiotic for urinary tract infection in October.  She is unsure if her itching started after that.  She denies any vaginal discharge.  She was previously seen for this in December, treated with Diflucan.  She declined pelvic exam at that time.  Did not feel any improvement with the Diflucan.  She is not currently sexually active and declines STI testing.  States she has history of total hysterectomy.  PERTINENT  PMH / PSH:  Past Medical History:  Diagnosis Date   Anxiety    Asthma    every now and then.  Wheezing and coughing   Cancer ([place]) 1997   left lung   Chronic abdominal pain    Chronic back pain    Chronic joint pain    Depression    HLD (hyperlipidemia)    Palpitations 01/2013   chronic, intermittent   Shortness of breath dyspnea    with exertion    OBJECTIVE:   BP 140/60    Pulse 88    Ht 5\' 5"  (1.651 m)    Wt 180 lb (81.6 kg)    SpO2 98%    BMI 29.95 kg/m    General: NAD, pleasant, able to participate in exam Respiratory: Normal effort, breathing comfortably on room air Abdomen: Bowel sounds present, nontender, nondistended, no hepatosplenomegaly. GU: Labia majora and minora appear atrophic, discomfort with insertion of speculum. Cyst to right labia minora (chronic). Unable to visualize cervix. Scant white/clear discharge at vaginal vault. No lesions.  Psych: Normal affect and mood  GU exam chaperoned by [name] CMA   ASSESSMENT/PLAN:   Lichen planus atrophicus Persistent symptoms for the last few months.  Did not improve with Diflucan in the past.  Wet prep negative today.  Given atrophic findings, in description, favor lichen planus.  Doubt STI on a as patient is not sexually active, did not appreciate much discharge at all. -Clobetasol  ointment BID x 14 days to break itch cycle currently -Can switch to a less potent steroid ointment afterwards -Return if not improving     Sharion Settler, [place]

## 2021-02-09 ENCOUNTER — Other Ambulatory Visit: Payer: Self-pay

## 2021-02-09 ENCOUNTER — Ambulatory Visit (INDEPENDENT_AMBULATORY_CARE_PROVIDER_SITE_OTHER): Payer: Medicare PPO | Admitting: Family Medicine

## 2021-02-09 VITALS — BP 140/60 | HR 88 | Ht 65.0 in | Wt 180.0 lb

## 2021-02-09 DIAGNOSIS — L439 Lichen planus, unspecified: Secondary | ICD-10-CM | POA: Diagnosis not present

## 2021-02-09 DIAGNOSIS — N898 Other specified noninflammatory disorders of vagina: Secondary | ICD-10-CM

## 2021-02-09 LAB — POCT WET PREP (WET MOUNT)
Clue Cells Wet Prep Whiff POC: NEGATIVE
Trichomonas Wet Prep HPF POC: ABSENT

## 2021-02-09 MED ORDER — CLOBETASOL PROPIONATE 0.05 % EX OINT: 1.0000 "application " | TOPICAL_OINTMENT | Freq: Two times a day (BID) | CUTANEOUS | 0 refills | Status: AC

## 2021-02-09 NOTE — Assessment & Plan Note (Signed)
Persistent symptoms for the last few months.  Did not improve with Diflucan in the past.  Wet prep negative today.  Given atrophic findings, in description, favor lichen planus.  Doubt STI on a as patient is not sexually active, did not appreciate much discharge at all. -Clobetasol ointment BID x 14 days to break itch cycle currently -Can switch to a less potent steroid ointment afterwards -Return if not improving

## 2021-02-10 ENCOUNTER — Other Ambulatory Visit: Payer: Self-pay | Admitting: Family Medicine

## 2021-02-10 DIAGNOSIS — I10 Essential (primary) hypertension: Secondary | ICD-10-CM

## 2021-02-13 NOTE — Telephone Encounter (Signed)
Patient was evaluated on 1/23.

## 2021-02-18 ENCOUNTER — Other Ambulatory Visit: Payer: Self-pay | Admitting: Family Medicine

## 2021-02-24 ENCOUNTER — Other Ambulatory Visit: Payer: Self-pay

## 2021-02-24 ENCOUNTER — Encounter: Payer: Self-pay | Admitting: Gastroenterology

## 2021-02-24 ENCOUNTER — Encounter: Payer: Self-pay | Admitting: *Deleted

## 2021-02-24 ENCOUNTER — Telehealth: Payer: Self-pay | Admitting: *Deleted

## 2021-02-24 ENCOUNTER — Ambulatory Visit (INDEPENDENT_AMBULATORY_CARE_PROVIDER_SITE_OTHER): Payer: Medicare PPO | Admitting: Gastroenterology

## 2021-02-24 VITALS — BP 134/74 | HR 86 | Temp 97.5°F | Ht 65.0 in | Wt 176.4 lb

## 2021-02-24 DIAGNOSIS — K5900 Constipation, unspecified: Secondary | ICD-10-CM

## 2021-02-24 DIAGNOSIS — R1013 Epigastric pain: Secondary | ICD-10-CM | POA: Diagnosis not present

## 2021-02-24 DIAGNOSIS — R1319 Other dysphagia: Secondary | ICD-10-CM

## 2021-02-24 NOTE — H&P (View-Only) (Signed)
Referring Provider: Shary Key, DO Primary Care Physician:  Shary Key, DO Primary GI: Dr. Gala Romney  Chief Complaint  Patient presents with   Dysphagia   Abdominal Pain    Upper abd    HPI:   [name] is a 61 y.o. female presenting today with a history of chronic constipation, dysphagia s/p multiple dilations with most recent EGD Dec 2021: non-obstructing Schatzki's ring s/p dilation with 56, 105, and 23 [place]. Chronically isolated elevation of Alk phos with GGT normal and AMA negative. ASMA negative. No CBD dilation, transaminases remaining normal. Chronic abdominal pain.   Constipation: Linzess 290 mcg daily. Linzess is miracle drug. BM daily. No straining. No OTC agents needed.   GERD: Dexilant daily  Epigastric pain: constant. History of the same but now constant. No NSAIDs. No fever/chills. No worsening or relieving factors. No distress today.   Dysphagia: for about a month now. Recurrent solid food dysphagia.   Past Medical History:  Diagnosis Date   Anxiety    Asthma    every now and then.  Wheezing and coughing   Cancer ([place]) 1997   left lung   Chronic abdominal pain    Chronic back pain    Chronic joint pain    Depression    HLD (hyperlipidemia)    Palpitations 01/2013   chronic, intermittent   Shortness of breath dyspnea    with exertion    Past Surgical History:  Procedure Laterality Date   ABDOMINAL HYSTERECTOMY     ABDOMINAL SURGERY     ovarian cyst removal   BIOPSY N/A 11/22/2013   Procedure: GASTRIC BIOPSY;  Surgeon: Daneil Dolin, MD;  [facility]: [facility];  Service: Endoscopy;  Laterality: N/A;   BLADDER SURGERY  suspension x4   x 4   CARPAL TUNNEL RELEASE Right 07/11/2015   Procedure: RIGHT CARPAL TUNNEL RELEASE;  Surgeon: Jessy Oto, MD;  Location: [place];  Service: Orthopedics;  Laterality: Right;   CHOLECYSTECTOMY N/A 04/05/2014   Procedure: LAPAROSCOPIC CHOLECYSTECTOMY;  Surgeon: Aviva Signs Md, MD;   Location: [facility];  Service: General;  Laterality: N/A;   COCCYGECTOMY N/A 04/11/2015   Procedure: COCCYGECTOMY WITH OSTEOTOMY THROUGH AREA OF DEFORMITY;  Surgeon: Jessy Oto, MD;  Location: [place];  Service: Orthopedics;  Laterality: N/A;   COLONOSCOPY     COLONOSCOPY WITH PROPOFOL N/A 11/22/2013   Dr. Gala Romney: Grade 3 and 4/internal hemorrhoidslikely source of hematochezia Normal colonoscopy otherwise.    COLONOSCOPY WITH PROPOFOL N/A 09/15/2017   Dr. Emerson Monte: Hemorrhoids, next colonoscopy 10 years   ESOPHAGOGASTRODUODENOSCOPY (EGD) WITH PROPOFOL N/A 11/22/2013   Dr. Gala Romney: Incomplete Schatzki's ring dilated and disrupted as described above.  Small hiatal hernia. Focally abnormal gastric mucosa of uncertain significance status post biopsy, reactive gastropathy, negative H.pylori   ESOPHAGOGASTRODUODENOSCOPY (EGD) WITH PROPOFOL N/A 08/04/2015   Dr. Gala Romney: mild Schatzki's ring s/p dilation, small hiatal hernia    ESOPHAGOGASTRODUODENOSCOPY (EGD) WITH PROPOFOL N/A 03/17/2017   Mild Schatki's ring s/p dilation, small hiatal hernia, otherwise normal   ESOPHAGOGASTRODUODENOSCOPY (EGD) WITH PROPOFOL N/A 09/18/2018   Dr. Gala Romney: Schatzki ring status post dilation and disruption.  Hiatal hernia.   ESOPHAGOGASTRODUODENOSCOPY (EGD) WITH PROPOFOL N/A 12/20/2019   non-obstructing Schatzki's ring s/p dilation with 56, 58, and 59 F.   EXCISION VAGINAL CYST Left 06/20/2012   Procedure: EXCISION LEFT LABIAL CYST;  Surgeon: Jonnie Kind, MD;  [facility];  Service: Gynecology;  Laterality: Left;   LOBECTOMY Left  LUNG CANCER SURGERY  1997   age 62, resection only   MALONEY DILATION N/A 11/22/2013   Procedure: [name] DILATION;  Surgeon: Daneil Dolin, MD;  [facility];  Service: Endoscopy;  Laterality: N/A;  Friend N/A 08/04/2015   Procedure: [name] DILATION;  Surgeon: Daneil Dolin, MD;  [facility];  Service: Endoscopy;  Laterality: N/A;   MALONEY DILATION  N/A 03/17/2017   Procedure: [name] DILATION;  Surgeon: Daneil Dolin, MD;  [facility];  Service: Endoscopy;  Laterality: N/A;   MALONEY DILATION N/A 09/18/2018   Procedure: [name] DILATION;  Surgeon: Daneil Dolin, MD;  [facility];  Service: Endoscopy;  Laterality: N/A;   MALONEY DILATION N/A 12/20/2019   Procedure: [name] DILATION;  Surgeon: Daneil Dolin, MD;  [facility];  Service: Endoscopy;  Laterality: N/A;   MASS EXCISION N/A 07/23/2014   Procedure: EXCISIONAL BIOPSY NODULE LEFT PARACOCCYGEAL AREA;  Surgeon: Jessy Oto, MD;  Location: [place];  Service: Orthopedics;  Laterality: N/A;   MASS EXCISION Left 10/10/2015   Procedure: EXCISIONAL BIOPSY OF LEFT DORSORADIAL FOREARM MASS LIPOMA;  Surgeon: Jessy Oto, MD;  Location: [place];  Service: Orthopedics;  Laterality: Left;   TUBAL LIGATION      Current Outpatient Medications  Medication Sig Dispense Refill   acyclovir (ZOVIRAX) 400 MG tablet Take 1 tablet by mouth twice daily 180 tablet 0   albuterol (PROVENTIL) (2.5 MG/3ML) 0.083% nebulizer solution USE 1 VIAL IN NEBULIZER EVERY 6 HOURS AS NEEDED FOR WHEEZING AND FOR SHORTNESS OF BREATH 75 mL 0   amitriptyline (ELAVIL) 150 MG tablet Take 1 tablet by mouth once daily 90 tablet 0   amLODipine (NORVASC) 10 MG tablet Take 1 tablet by mouth once daily 90 tablet 0   atorvastatin (LIPITOR) 40 MG tablet Take 1 tablet by mouth once daily 90 tablet 3   budesonide-formoterol (SYMBICORT) 160-4.5 MCG/ACT inhaler Inhale 2 puffs into the lungs 2 (two) times daily. (Patient taking differently: Inhale 2 puffs into the lungs 2 (two) times daily as needed (respiratory issues.).) 1 Inhaler 3   celecoxib (CELEBREX) 200 MG capsule Take 1 capsule by mouth once daily 60 capsule 0   DEXILANT 60 MG capsule TAKE 1 CAPSULE BY MOUTH ONCE DAILY WITH BREAKFAST 90 capsule 3   diclofenac Sodium (VOLTAREN) 1 % GEL Apply 2 g topically 4 (four) times  daily. 350 g 3   DULoxetine (CYMBALTA) 60 MG capsule Take 1 capsule by mouth twice daily 120 capsule 0   furosemide (LASIX) 20 MG tablet Take 1 tablet (20 mg total) by mouth as needed for edema (swelling). 30 tablet 3   LINZESS 290 MCG CAPS capsule TAKE 1 CAPSULE BY MOUTH ONCE DAILY BEFORE BREAKFAST 30 capsule 0   Menthol, Topical Analgesic, (BIOFREEZE) 4 % GEL Apaply to area of discomfort left posterior upper neck. 74 mL 3   pregabalin (LYRICA) 100 MG capsule TAKE 1 CAPSULE BY MOUTH THREE TIMES DAILY 90 capsule 0   No current facility-administered medications for this visit.    Allergies as of 02/24/2021 - Review Complete 02/24/2021  Allergen Reaction Noted   Promethazine hcl Nausea And Vomiting and Other (See Comments) 07/26/2008    Family History  Problem Relation Age of Onset   Diabetes Other    Heart disease Other        has pace maker   Hypertension Mother    Kidney disease Mother  Hypertension Father    Kidney disease Father    Heart disease Father    Cancer Father        leukemia   Hypertension Sister    Hypertension Sister    Colon cancer Neg Hx     Social History   Socioeconomic History   Marital status: Married    Spouse name: Not on file   Number of children: 3   Years of education: Not on file   Highest education level: Not on file  Occupational History   Not on file  Tobacco Use   Smoking status: Former    Packs/day: 1.50    Years: 25.00    Pack years: 37.50    Types: Cigarettes    Quit date: 04/07/1995    Years since quitting: 25.9   Smokeless tobacco: Never  Vaping Use   Vaping Use: Never used  Substance and Sexual Activity   Alcohol use: No   Drug use: No   Sexual activity: Not Currently    Birth control/protection: Surgical  Other Topics Concern   Not on file  Social History Narrative   Not on file   Social Determinants of Health   Financial Resource Strain: Not on file  Food Insecurity: Not on file  Transportation Needs: Not on  file  Physical Activity: Not on file  Stress: Not on file  Social Connections: Not on file    Review of Systems: Gen: Denies fever, chills, anorexia. Denies fatigue, weakness, weight loss.  CV: Denies chest pain, palpitations, syncope, peripheral edema, and claudication. Resp: Denies dyspnea at rest, cough, wheezing, coughing up blood, and pleurisy. GI: see HPI Derm: Denies rash, itching, dry skin Psych: Denies depression, anxiety, memory loss, confusion. No homicidal or suicidal ideation.  Heme: Denies bruising, bleeding, and enlarged lymph nodes.  Physical Exam: BP 134/74    Pulse 86    Temp (!) 97.5 F (36.4 C) (Temporal)    Ht _0  (1.651 m)    Wt 176 lb 6.4 oz (80 kg)    BMI 29.35 kg/m  General:   Alert and oriented. No distress noted. Pleasant and cooperative.  Head:  Normocephalic and atraumatic. Eyes:  Conjuctiva clear without scleral icterus. Mouth:  mask in place Cardiac: S1 S2 present  Lungs: mild expiratory wheeze bilaterally Abdomen:  +BS, soft, mild TTP epigastric and non-distended. No rebound or guarding. No HSM or masses noted. Msk:  Symmetrical without gross deformities. Normal posture. Extremities:  Without edema. Neurologic:  Alert and  oriented x4 Psych:  Alert and cooperative. Normal mood and affect.  ASSESSMENT: SHIVA KARIS is a 61 y.o. female presenting today with a history of chronic constipation, dysphagia s/p multiple dilations with most recent EGD Dec 2021: non-obstructing Schatzki's ring s/p dilation with 77, 51, and 60 [place]. Chronically isolated elevation of Alk phos with GGT normal and AMA negative. ASMA negative. No CBD dilation, transaminases remaining normal. Chronic abdominal pain.   Constipation: well-managed with Linzess 290 mcg daily.   GERD: controlled with Dexilant daily.  Now with epigastric pain that is worsened from baseline. Recurrent dysphagia for approximately a month. She has noted improvement with dilation in the past. No  distress today or concerning physical exams. Will update labs. Arrange EGD/dilation.  Isolated elevated AP: chronic. Negative AMA. Negative GGT. Update CMP now.   PLAN:  Proceed with upper endoscopy/dilation by Dr. Abbey Chatters in near future: the risks, benefits, and alternatives have been discussed with the patient in detail. The patient states understanding  and desires to proceed.   Continue Dexilant daily  Continue Linzess 290 mcg daily  CBC, CMP, lipase today  Further recommendations to follow   Annitta Needs, PhD, ANP-BC [facility]

## 2021-02-24 NOTE — Patient Instructions (Signed)
We are arranging an upper endoscopy with dilation by Dr. Abbey Chatters.  Please have blood work done when you are able.  It was good to see you!   I enjoyed seeing you again today! As you know, I value our relationship and want to provide genuine, compassionate, and quality care. I welcome your feedback. If you receive a survey regarding your visit,  I greatly appreciate you taking time to fill this out. See you next time!  Annitta Needs, PhD, ANP-BC [facility]

## 2021-02-24 NOTE — Progress Notes (Signed)
Referring Provider: Shary Key, DO Primary Care Physician:  Shary Key, DO Primary GI: Dr. Gala Romney  Chief Complaint  Patient presents with   Dysphagia   Abdominal Pain    Upper abd    HPI:   [name] is a 61 y.o. female presenting today with a history of chronic constipation, dysphagia s/p multiple dilations with most recent EGD Dec 2021: non-obstructing Schatzki's ring s/p dilation with 56, 105, and 23 [place]. Chronically isolated elevation of Alk phos with GGT normal and AMA negative. ASMA negative. No CBD dilation, transaminases remaining normal. Chronic abdominal pain.   Constipation: Linzess 290 mcg daily. Linzess is miracle drug. BM daily. No straining. No OTC agents needed.   GERD: Dexilant daily  Epigastric pain: constant. History of the same but now constant. No NSAIDs. No fever/chills. No worsening or relieving factors. No distress today.   Dysphagia: for about a month now. Recurrent solid food dysphagia.   Past Medical History:  Diagnosis Date   Anxiety    Asthma    every now and then.  Wheezing and coughing   Cancer ([place]) 1997   left lung   Chronic abdominal pain    Chronic back pain    Chronic joint pain    Depression    HLD (hyperlipidemia)    Palpitations 01/2013   chronic, intermittent   Shortness of breath dyspnea    with exertion    Past Surgical History:  Procedure Laterality Date   ABDOMINAL HYSTERECTOMY     ABDOMINAL SURGERY     ovarian cyst removal   BIOPSY N/A 11/22/2013   Procedure: GASTRIC BIOPSY;  Surgeon: Daneil Dolin, MD;  [facility]: [facility];  Service: Endoscopy;  Laterality: N/A;   BLADDER SURGERY  suspension x4   x 4   CARPAL TUNNEL RELEASE Right 07/11/2015   Procedure: RIGHT CARPAL TUNNEL RELEASE;  Surgeon: Jessy Oto, MD;  Location: [place];  Service: Orthopedics;  Laterality: Right;   CHOLECYSTECTOMY N/A 04/05/2014   Procedure: LAPAROSCOPIC CHOLECYSTECTOMY;  Surgeon: Aviva Signs Md, MD;   Location: [facility];  Service: General;  Laterality: N/A;   COCCYGECTOMY N/A 04/11/2015   Procedure: COCCYGECTOMY WITH OSTEOTOMY THROUGH AREA OF DEFORMITY;  Surgeon: Jessy Oto, MD;  Location: [place];  Service: Orthopedics;  Laterality: N/A;   COLONOSCOPY     COLONOSCOPY WITH PROPOFOL N/A 11/22/2013   Dr. Gala Romney: Grade 3 and 4/internal hemorrhoidslikely source of hematochezia Normal colonoscopy otherwise.    COLONOSCOPY WITH PROPOFOL N/A 09/15/2017   Dr. Emerson Monte: Hemorrhoids, next colonoscopy 10 years   ESOPHAGOGASTRODUODENOSCOPY (EGD) WITH PROPOFOL N/A 11/22/2013   Dr. Gala Romney: Incomplete Schatzki's ring dilated and disrupted as described above.  Small hiatal hernia. Focally abnormal gastric mucosa of uncertain significance status post biopsy, reactive gastropathy, negative H.pylori   ESOPHAGOGASTRODUODENOSCOPY (EGD) WITH PROPOFOL N/A 08/04/2015   Dr. Gala Romney: mild Schatzki's ring s/p dilation, small hiatal hernia    ESOPHAGOGASTRODUODENOSCOPY (EGD) WITH PROPOFOL N/A 03/17/2017   Mild Schatki's ring s/p dilation, small hiatal hernia, otherwise normal   ESOPHAGOGASTRODUODENOSCOPY (EGD) WITH PROPOFOL N/A 09/18/2018   Dr. Gala Romney: Schatzki ring status post dilation and disruption.  Hiatal hernia.   ESOPHAGOGASTRODUODENOSCOPY (EGD) WITH PROPOFOL N/A 12/20/2019   non-obstructing Schatzki's ring s/p dilation with 56, 58, and 59 F.   EXCISION VAGINAL CYST Left 06/20/2012   Procedure: EXCISION LEFT LABIAL CYST;  Surgeon: Jonnie Kind, MD;  [facility];  Service: Gynecology;  Laterality: Left;   LOBECTOMY Left  LUNG CANCER SURGERY  1997   age 62, resection only   MALONEY DILATION N/A 11/22/2013   Procedure: [name] DILATION;  Surgeon: Daneil Dolin, MD;  [facility];  Service: Endoscopy;  Laterality: N/A;  Friend N/A 08/04/2015   Procedure: [name] DILATION;  Surgeon: Daneil Dolin, MD;  [facility];  Service: Endoscopy;  Laterality: N/A;   MALONEY DILATION  N/A 03/17/2017   Procedure: [name] DILATION;  Surgeon: Daneil Dolin, MD;  [facility];  Service: Endoscopy;  Laterality: N/A;   MALONEY DILATION N/A 09/18/2018   Procedure: [name] DILATION;  Surgeon: Daneil Dolin, MD;  [facility];  Service: Endoscopy;  Laterality: N/A;   MALONEY DILATION N/A 12/20/2019   Procedure: [name] DILATION;  Surgeon: Daneil Dolin, MD;  [facility];  Service: Endoscopy;  Laterality: N/A;   MASS EXCISION N/A 07/23/2014   Procedure: EXCISIONAL BIOPSY NODULE LEFT PARACOCCYGEAL AREA;  Surgeon: Jessy Oto, MD;  Location: [place];  Service: Orthopedics;  Laterality: N/A;   MASS EXCISION Left 10/10/2015   Procedure: EXCISIONAL BIOPSY OF LEFT DORSORADIAL FOREARM MASS LIPOMA;  Surgeon: Jessy Oto, MD;  Location: [place];  Service: Orthopedics;  Laterality: Left;   TUBAL LIGATION      Current Outpatient Medications  Medication Sig Dispense Refill   acyclovir (ZOVIRAX) 400 MG tablet Take 1 tablet by mouth twice daily 180 tablet 0   albuterol (PROVENTIL) (2.5 MG/3ML) 0.083% nebulizer solution USE 1 VIAL IN NEBULIZER EVERY 6 HOURS AS NEEDED FOR WHEEZING AND FOR SHORTNESS OF BREATH 75 mL 0   amitriptyline (ELAVIL) 150 MG tablet Take 1 tablet by mouth once daily 90 tablet 0   amLODipine (NORVASC) 10 MG tablet Take 1 tablet by mouth once daily 90 tablet 0   atorvastatin (LIPITOR) 40 MG tablet Take 1 tablet by mouth once daily 90 tablet 3   budesonide-formoterol (SYMBICORT) 160-4.5 MCG/ACT inhaler Inhale 2 puffs into the lungs 2 (two) times daily. (Patient taking differently: Inhale 2 puffs into the lungs 2 (two) times daily as needed (respiratory issues.).) 1 Inhaler 3   celecoxib (CELEBREX) 200 MG capsule Take 1 capsule by mouth once daily 60 capsule 0   DEXILANT 60 MG capsule TAKE 1 CAPSULE BY MOUTH ONCE DAILY WITH BREAKFAST 90 capsule 3   diclofenac Sodium (VOLTAREN) 1 % GEL Apply 2 g topically 4 (four) times  daily. 350 g 3   DULoxetine (CYMBALTA) 60 MG capsule Take 1 capsule by mouth twice daily 120 capsule 0   furosemide (LASIX) 20 MG tablet Take 1 tablet (20 mg total) by mouth as needed for edema (swelling). 30 tablet 3   LINZESS 290 MCG CAPS capsule TAKE 1 CAPSULE BY MOUTH ONCE DAILY BEFORE BREAKFAST 30 capsule 0   Menthol, Topical Analgesic, (BIOFREEZE) 4 % GEL Apaply to area of discomfort left posterior upper neck. 74 mL 3   pregabalin (LYRICA) 100 MG capsule TAKE 1 CAPSULE BY MOUTH THREE TIMES DAILY 90 capsule 0   No current facility-administered medications for this visit.    Allergies as of 02/24/2021 - Review Complete 02/24/2021  Allergen Reaction Noted   Promethazine hcl Nausea And Vomiting and Other (See Comments) 07/26/2008    Family History  Problem Relation Age of Onset   Diabetes Other    Heart disease Other        has pace maker   Hypertension Mother    Kidney disease Mother  Hypertension Father    Kidney disease Father    Heart disease Father    Cancer Father        leukemia   Hypertension Sister    Hypertension Sister    Colon cancer Neg Hx     Social History   Socioeconomic History   Marital status: Married    Spouse name: Not on file   Number of children: 3   Years of education: Not on file   Highest education level: Not on file  Occupational History   Not on file  Tobacco Use   Smoking status: Former    Packs/day: 1.50    Years: 25.00    Pack years: 37.50    Types: Cigarettes    Quit date: 04/07/1995    Years since quitting: 25.9   Smokeless tobacco: Never  Vaping Use   Vaping Use: Never used  Substance and Sexual Activity   Alcohol use: No   Drug use: No   Sexual activity: Not Currently    Birth control/protection: Surgical  Other Topics Concern   Not on file  Social History Narrative   Not on file   Social Determinants of Health   Financial Resource Strain: Not on file  Food Insecurity: Not on file  Transportation Needs: Not on  file  Physical Activity: Not on file  Stress: Not on file  Social Connections: Not on file    Review of Systems: Gen: Denies fever, chills, anorexia. Denies fatigue, weakness, weight loss.  CV: Denies chest pain, palpitations, syncope, peripheral edema, and claudication. Resp: Denies dyspnea at rest, cough, wheezing, coughing up blood, and pleurisy. GI: see HPI Derm: Denies rash, itching, dry skin Psych: Denies depression, anxiety, memory loss, confusion. No homicidal or suicidal ideation.  Heme: Denies bruising, bleeding, and enlarged lymph nodes.  Physical Exam: BP 134/74    Pulse 86    Temp (!) 97.5 F (36.4 C) (Temporal)    Ht _0  (1.651 m)    Wt 176 lb 6.4 oz (80 kg)    BMI 29.35 kg/m  General:   Alert and oriented. No distress noted. Pleasant and cooperative.  Head:  Normocephalic and atraumatic. Eyes:  Conjuctiva clear without scleral icterus. Mouth:  mask in place Cardiac: S1 S2 present  Lungs: mild expiratory wheeze bilaterally Abdomen:  +BS, soft, mild TTP epigastric and non-distended. No rebound or guarding. No HSM or masses noted. Msk:  Symmetrical without gross deformities. Normal posture. Extremities:  Without edema. Neurologic:  Alert and  oriented x4 Psych:  Alert and cooperative. Normal mood and affect.  ASSESSMENT: SHIVA KARIS is a 61 y.o. female presenting today with a history of chronic constipation, dysphagia s/p multiple dilations with most recent EGD Dec 2021: non-obstructing Schatzki's ring s/p dilation with 77, 51, and 60 [place]. Chronically isolated elevation of Alk phos with GGT normal and AMA negative. ASMA negative. No CBD dilation, transaminases remaining normal. Chronic abdominal pain.   Constipation: well-managed with Linzess 290 mcg daily.   GERD: controlled with Dexilant daily.  Now with epigastric pain that is worsened from baseline. Recurrent dysphagia for approximately a month. She has noted improvement with dilation in the past. No  distress today or concerning physical exams. Will update labs. Arrange EGD/dilation.  Isolated elevated AP: chronic. Negative AMA. Negative GGT. Update CMP now.   PLAN:  Proceed with upper endoscopy/dilation by Dr. Abbey Chatters in near future: the risks, benefits, and alternatives have been discussed with the patient in detail. The patient states understanding  and desires to proceed.   Continue Dexilant daily  Continue Linzess 290 mcg daily  CBC, CMP, lipase today  Further recommendations to follow   Annitta Needs, PhD, ANP-BC [facility]

## 2021-02-24 NOTE — Telephone Encounter (Signed)
PA approved via [employer] website for EGD/DIL. Auth# [id number], DOS: 02/27/2021-05/28/2021

## 2021-02-26 ENCOUNTER — Other Ambulatory Visit: Payer: Self-pay | Admitting: Family Medicine

## 2021-02-26 DIAGNOSIS — M48062 Spinal stenosis, lumbar region with neurogenic claudication: Secondary | ICD-10-CM

## 2021-02-27 ENCOUNTER — Encounter (HOSPITAL_COMMUNITY): Payer: Self-pay

## 2021-02-27 ENCOUNTER — Ambulatory Visit (HOSPITAL_COMMUNITY)
Admission: RE | Admit: 2021-02-27 | Discharge: 2021-02-27 | Disposition: A | Discharge status: Home / Self Care | Payer: Medicare PPO | Attending: Internal Medicine | Admitting: Internal Medicine

## 2021-02-27 ENCOUNTER — Ambulatory Visit (HOSPITAL_COMMUNITY): Payer: Medicare PPO | Admitting: Anesthesiology

## 2021-02-27 ENCOUNTER — Ambulatory Visit (HOSPITAL_BASED_OUTPATIENT_CLINIC_OR_DEPARTMENT_OTHER): Payer: Medicare PPO | Admitting: Anesthesiology

## 2021-02-27 ENCOUNTER — Encounter (HOSPITAL_COMMUNITY)
Admission: RE | Disposition: A | Discharge status: Home / Self Care | Payer: Self-pay | Source: Home / Self Care | Attending: Internal Medicine

## 2021-02-27 ENCOUNTER — Other Ambulatory Visit: Payer: Self-pay

## 2021-02-27 DIAGNOSIS — F32A Depression, unspecified: Secondary | ICD-10-CM | POA: Insufficient documentation

## 2021-02-27 DIAGNOSIS — R1310 Dysphagia, unspecified: Secondary | ICD-10-CM | POA: Insufficient documentation

## 2021-02-27 DIAGNOSIS — K219 Gastro-esophageal reflux disease without esophagitis: Secondary | ICD-10-CM | POA: Insufficient documentation

## 2021-02-27 DIAGNOSIS — Z85118 Personal history of other malignant neoplasm of bronchus and lung: Secondary | ICD-10-CM | POA: Insufficient documentation

## 2021-02-27 DIAGNOSIS — K2289 Other specified disease of esophagus: Secondary | ICD-10-CM | POA: Diagnosis not present

## 2021-02-27 DIAGNOSIS — K5900 Constipation, unspecified: Secondary | ICD-10-CM | POA: Diagnosis not present

## 2021-02-27 DIAGNOSIS — G709 Myoneural disorder, unspecified: Secondary | ICD-10-CM | POA: Diagnosis not present

## 2021-02-27 DIAGNOSIS — K222 Esophageal obstruction: Secondary | ICD-10-CM | POA: Insufficient documentation

## 2021-02-27 DIAGNOSIS — I5189 Other ill-defined heart diseases: Secondary | ICD-10-CM | POA: Diagnosis not present

## 2021-02-27 DIAGNOSIS — K2970 Gastritis, unspecified, without bleeding: Secondary | ICD-10-CM | POA: Diagnosis not present

## 2021-02-27 DIAGNOSIS — J449 Chronic obstructive pulmonary disease, unspecified: Secondary | ICD-10-CM | POA: Diagnosis not present

## 2021-02-27 DIAGNOSIS — K319 Disease of stomach and duodenum, unspecified: Secondary | ICD-10-CM | POA: Insufficient documentation

## 2021-02-27 DIAGNOSIS — Z87891 Personal history of nicotine dependence: Secondary | ICD-10-CM | POA: Insufficient documentation

## 2021-02-27 DIAGNOSIS — I10 Essential (primary) hypertension: Secondary | ICD-10-CM

## 2021-02-27 DIAGNOSIS — F419 Anxiety disorder, unspecified: Secondary | ICD-10-CM | POA: Diagnosis not present

## 2021-02-27 HISTORY — PX: BALLOON DILATION: SHX5330

## 2021-02-27 HISTORY — PX: ESOPHAGOGASTRODUODENOSCOPY (EGD) WITH PROPOFOL: SHX5813

## 2021-02-27 HISTORY — PX: BIOPSY: SHX5522

## 2021-02-27 SURGERY — ESOPHAGOGASTRODUODENOSCOPY (EGD) WITH PROPOFOL
Anesthesia: General

## 2021-02-27 MED ORDER — PROPOFOL 10 MG/ML IV BOLUS
INTRAVENOUS | Status: DC | PRN
  Administered 2021-02-27: 50 mg via INTRAVENOUS
  Administered 2021-02-27: 20 mg via INTRAVENOUS
  Administered 2021-02-27: 100 mg via INTRAVENOUS
  Administered 2021-02-27: 40 mg via INTRAVENOUS

## 2021-02-27 MED ORDER — LIDOCAINE HCL (CARDIAC) PF 100 MG/5ML IV SOSY
PREFILLED_SYRINGE | INTRAVENOUS | Status: DC | PRN
  Administered 2021-02-27: 50 mg via INTRAVENOUS

## 2021-02-27 MED ORDER — LACTATED RINGERS IV SOLN
INTRAVENOUS | Status: DC | PRN
Start: 2021-02-27 — End: 2021-02-27

## 2021-02-27 NOTE — Discharge Instructions (Addendum)
EGD Discharge instructions Please read the instructions outlined below and refer to this sheet in the next few weeks. These discharge instructions provide you with general information on caring for yourself after you leave the hospital. Your doctor may also give you specific instructions. While your treatment has been planned according to the most current medical practices available, unavoidable complications occasionally occur. If you have any problems or questions after discharge, please call your doctor. ACTIVITY You may resume your regular activity but move at a slower pace for the next 24 hours.  Take frequent rest periods for the next 24 hours.  Walking will help expel (get rid of) the air and reduce the bloated feeling in your abdomen.  No driving for 24 hours (because of the anesthesia (medicine) used during the test).  You may shower.  Do not sign any important legal documents or operate any machinery for 24 hours (because of the anesthesia used during the test).  NUTRITION Drink plenty of fluids.  You may resume your normal diet.  Begin with a light meal and progress to your normal diet.  Avoid alcoholic beverages for 24 hours or as instructed by your caregiver.  MEDICATIONS You may resume your normal medications unless your caregiver tells you otherwise.  WHAT YOU CAN EXPECT TODAY You may experience abdominal discomfort such as a feeling of fullness or gas pains.  FOLLOW-UP Your doctor will discuss the results of your test with you.  SEEK IMMEDIATE MEDICAL ATTENTION IF ANY OF THE FOLLOWING OCCUR: Excessive nausea (feeling sick to your stomach) and/or vomiting.  Severe abdominal pain and distention (swelling).  Trouble swallowing.  Temperature over 101 F (37.8 C).  Rectal bleeding or vomiting of blood.    Your EGD revealed mild amount inflammation in your stomach.  I took biopsies of this to rule out infection with a bacteria called H. pylori.  Await pathology results, my  office will contact you.  I found a Schatzki's ring in the distal portion of your esophagus.  I stretched this today.  I also obliterated it with biopsy forceps.  Hopefully this helps with your swallowing.  Continue on Dexilant daily.  Follow-up with GI in 4 months.    MESSAGE LEFT ON VOICEMAIL AT OFFICE TO NOTIFY PATIENT OF FOLLOW UP APPOINTMENT WITH ANNA  I hope you have a great rest of your week!  [name]. [name], D.O. Gastroenterology and Hepatology [facility]

## 2021-02-27 NOTE — Anesthesia Preprocedure Evaluation (Signed)
Anesthesia Evaluation  Patient identified by MRN, date of birth, ID band Patient awake    Reviewed: Allergy & Precautions, H&P , NPO status , Patient's Chart, lab work & pertinent test results, reviewed documented beta blocker date and time   Airway Mallampati: II  TM Distance: >3 FB Neck ROM: full    Dental no notable dental hx. (+) Missing   Pulmonary shortness of breath, asthma , sleep apnea , COPD, former smoker,    Pulmonary exam normal breath sounds clear to auscultation       Cardiovascular Exercise Tolerance: Good hypertension,  Rhythm:regular Rate:Normal     Neuro/Psych PSYCHIATRIC DISORDERS Anxiety Depression  Neuromuscular disease    GI/Hepatic Neg liver ROS, GERD  Medicated,  Endo/Other  negative endocrine ROS  Renal/GU negative Renal ROS  negative genitourinary   Musculoskeletal   Abdominal   Peds  Hematology  (+) Blood dyscrasia, anemia ,   Anesthesia Other Findings H/o lung ca One tooth  Reproductive/Obstetrics negative OB ROS                             Anesthesia Physical Anesthesia Plan  ASA: 3  Anesthesia Plan: General   Post-op Pain Management:    Induction:   PONV Risk Score and Plan: Propofol infusion  Airway Management Planned:   Additional Equipment:   Intra-op Plan:   Post-operative Plan:   Informed Consent: I have reviewed the patients History and Physical, chart, labs and discussed the procedure including the risks, benefits and alternatives for the proposed anesthesia with the patient or authorized representative who has indicated his/her understanding and acceptance.     Dental Advisory Given  Plan Discussed with: CRNA  Anesthesia Plan Comments:         Anesthesia Quick Evaluation

## 2021-02-27 NOTE — Interval H&P Note (Signed)
History and Physical Interval Note:  02/27/2021 11:50 AM  [name]  has presented today for surgery, with the diagnosis of dysphagia.  The various methods of treatment have been discussed with the patient and family. After consideration of risks, benefits and other options for treatment, the patient has consented to  Procedure(s) with comments: ESOPHAGOGASTRODUODENOSCOPY (EGD) WITH PROPOFOL (N/A) - 11:30am BALLOON DILATION (N/A) as a surgical intervention.  The patient's history has been reviewed, patient examined, no change in status, stable for surgery.  I have reviewed the patient's chart and labs.  Questions were answered to the patient's satisfaction.     Eloise Harman

## 2021-02-27 NOTE — Op Note (Signed)
[facility] Patient Name: [name] Procedure Date: 02/27/2021 11:49 AM MRN: [id number] Date of Birth: Apr 16, 1960 Attending MD: Elon Alas. Abbey Chatters DO CSN: [id number] Age: 61 Admit Type: Outpatient Procedure:                Upper GI endoscopy Indications:              Dysphagia Providers:                Elon Alas. Abbey Chatters, DO, Crystal Page, Wynonia Musty Tech, Technician Referring MD:              Medicines:                See the Anesthesia note for documentation of the                            administered medications Complications:            No immediate complications. Estimated Blood Loss:     Estimated blood loss was minimal. Procedure:                Pre-Anesthesia Assessment:                           - The anesthesia plan was to use monitored                            anesthesia care (MAC).                           After obtaining informed consent, the endoscope was                            passed under direct vision. Throughout the                            procedure, the patient's blood pressure, pulse, and                            oxygen saturations were monitored continuously. The                            GIF-H190 ([id number]) scope was introduced through the                            mouth, and advanced to the second part of duodenum.                            The upper GI endoscopy was accomplished without                            difficulty. The patient tolerated the procedure                            well. Scope In: 12:00:02 PM Scope Out:  12:07:37 PM Total Procedure Duration: 0 hours 7 minutes 35 seconds  Findings:      A mild Schatzki ring was found in the distal esophagus. A TTS dilator       was passed through the scope. Dilation with an 18-19-20 mm balloon       dilator was performed to 20 mm. The dilation site was examined and       showed moderate improvement in luminal narrowing. Ring was then        obliterated with biopsy forceps.      The Z-line was variable and was found 40 cm from the incisors. Biopsies       were taken with a cold forceps for histology.      Patchy mild inflammation characterized by erythema was found in the       gastric antrum. Biopsies were taken with a cold forceps for Helicobacter       pylori testing.      The duodenal bulb, first portion of the duodenum and second portion of       the duodenum were normal. Impression:               - Mild Schatzki ring. Dilated.                           - Z-line variable, 40 cm from the incisors.                            Biopsied.                           - Gastritis. Biopsied.                           - Normal duodenal bulb, first portion of the                            duodenum and second portion of the duodenum. Moderate Sedation:      Per Anesthesia Care Recommendation:           - Patient has a contact number available for                            emergencies. The signs and symptoms of potential                            delayed complications were discussed with the                            patient. Return to normal activities tomorrow.                            Written discharge instructions were provided to the                            patient.                           - Resume previous diet.                           -  Continue present medications.                           - Await pathology results.                           - Repeat upper endoscopy PRN for retreatment.                           - Use Dexilant (dexlansoprazole) 60 mg PO daily. Procedure Code(s):        --- Professional ---                           [phone], Esophagogastroduodenoscopy, flexible,                            transoral; with transendoscopic balloon dilation of                            esophagus (less than 30 mm diameter)                           43239, 59, Esophagogastroduodenoscopy, flexible,                             transoral; with biopsy, single or multiple Diagnosis Code(s):        --- Professional ---                           K22.2, Esophageal obstruction                           K22.8, Other specified diseases of esophagus                           K29.70, Gastritis, unspecified, without bleeding                           R13.10, Dysphagia, unspecified CPT copyright 2019 American Medical Association. All rights reserved. The codes documented in this report are preliminary and upon coder review may  be revised to meet current compliance requirements. Elon Alas. Abbey Chatters, DO [place] Abbey Chatters, DO 02/27/2021 12:10:59 PM This report has been signed electronically. Number of Addenda: 0

## 2021-02-27 NOTE — Anesthesia Postprocedure Evaluation (Signed)
Anesthesia Post Note  Patient: [name]  Procedure(s) Performed: ESOPHAGOGASTRODUODENOSCOPY (EGD) WITH PROPOFOL BALLOON DILATION BIOPSY  Patient location during evaluation: Phase II Anesthesia Type: General Level of consciousness: awake Pain management: pain level controlled Vital Signs Assessment: post-procedure vital signs reviewed and stable Respiratory status: spontaneous breathing and respiratory function stable Cardiovascular status: blood pressure returned to baseline and stable Postop Assessment: no headache and no apparent nausea or vomiting Anesthetic complications: no Comments: Late entry   No notable events documented.   Last Vitals:  Vitals:   02/27/21 1208 02/27/21 1211  BP: 116/65 118/68  Pulse: 80 78  Resp: 14 18  Temp: 36.6 C   SpO2: 94% 96%    Last Pain:  Vitals:   02/27/21 1211  TempSrc:   PainSc: 0-No pain                 Louann Sjogren

## 2021-02-27 NOTE — Transfer of Care (Signed)
Immediate Anesthesia Transfer of Care Note  Patient: [name]  Procedure(s) Performed: ESOPHAGOGASTRODUODENOSCOPY (EGD) WITH PROPOFOL BALLOON DILATION BIOPSY  Patient Location: Endoscopy Unit  Anesthesia Type:General  Level of Consciousness: drowsy  Airway & Oxygen Therapy: Patient Spontanous Breathing  Post-op Assessment: Report given to RN and Post -op Vital signs reviewed and stable  Post vital signs: Reviewed and stable  Last Vitals:  Vitals Value Taken Time  BP    Temp    Pulse 75   Resp 14   SpO2 93%     Last Pain:  Vitals:   02/27/21 1157  TempSrc:   PainSc: 0-No pain      Patients Stated Pain Goal: 9 (09/98/33 8250)  Complications: No notable events documented.

## 2021-03-02 LAB — SURGICAL PATHOLOGY

## 2021-03-03 ENCOUNTER — Encounter (HOSPITAL_COMMUNITY): Payer: Self-pay | Admitting: Internal Medicine

## 2021-03-16 DIAGNOSIS — R1013 Epigastric pain: Secondary | ICD-10-CM | POA: Diagnosis not present

## 2021-03-17 LAB — CBC WITH DIFFERENTIAL/PLATELET
Absolute Monocytes: 262 cells/uL (ref 200–950)
Basophils Absolute: 30 cells/uL (ref 0–200)
Basophils Relative: 0.7 %
Eosinophils Absolute: 194 cells/uL (ref 15–500)
Eosinophils Relative: 4.5 %
HCT: 38.9 % (ref 35.0–45.0)
Hemoglobin: 13.3 g/dL (ref 11.7–15.5)
Lymphs Abs: 1810 cells/uL (ref 850–3900)
MCH: 31.4 pg (ref 27.0–33.0)
MCHC: 34.2 g/dL (ref 32.0–36.0)
MCV: 92 fL (ref 80.0–100.0)
MPV: 9.9 fL (ref 7.5–12.5)
Monocytes Relative: 6.1 %
Neutro Abs: 2004 cells/uL (ref 1500–7800)
Neutrophils Relative %: 46.6 %
Platelets: 287 10*3/uL (ref 140–400)
RBC: 4.23 10*6/uL (ref 3.80–5.10)
RDW: 13 % (ref 11.0–15.0)
Total Lymphocyte: 42.1 %
WBC: 4.3 10*3/uL (ref 3.8–10.8)

## 2021-03-17 LAB — COMPLETE METABOLIC PANEL WITH GFR
AG Ratio: 1.4 (calc) (ref 1.0–2.5)
ALT: 9 U/L (ref 6–29)
AST: 10 U/L (ref 10–35)
Albumin: 4.2 g/dL (ref 3.6–5.1)
Alkaline phosphatase (APISO): 139 U/L (ref 37–153)
BUN: 8 mg/dL (ref 7–25)
CO2: 30 mmol/L (ref 20–32)
Calcium: 9.1 mg/dL (ref 8.6–10.4)
Chloride: 104 mmol/L (ref 98–110)
Creat: 0.85 mg/dL (ref 0.50–1.03)
Globulin: 2.9 g/dL (calc) (ref 1.9–3.7)
Glucose, Bld: 108 mg/dL — ABNORMAL HIGH (ref 65–99)
Potassium: 3.7 mmol/L (ref 3.5–5.3)
Sodium: 141 mmol/L (ref 135–146)
Total Bilirubin: 0.5 mg/dL (ref 0.2–1.2)
Total Protein: 7.1 g/dL (ref 6.1–8.1)
eGFR: 79 mL/min/{1.73_m2} (ref >=60)

## 2021-03-17 LAB — LIPASE: Lipase: 11 U/L (ref 7–60)

## 2021-03-20 ENCOUNTER — Other Ambulatory Visit: Payer: Self-pay | Admitting: Family Medicine

## 2021-03-25 ENCOUNTER — Other Ambulatory Visit: Payer: Self-pay | Admitting: Family Medicine

## 2021-04-01 ENCOUNTER — Ambulatory Visit: Payer: Medicare HMO | Admitting: Gastroenterology

## 2021-04-08 ENCOUNTER — Encounter: Payer: Self-pay | Admitting: Internal Medicine

## 2021-04-21 ENCOUNTER — Telehealth: Payer: Self-pay

## 2021-04-21 ENCOUNTER — Other Ambulatory Visit: Payer: Self-pay | Admitting: Family Medicine

## 2021-04-21 MED ORDER — DEXLANSOPRAZOLE 60 MG PO CPDR: 60.0000 mg | DELAYED_RELEASE_CAPSULE | Freq: Every day | ORAL | 3 refills | Status: DC

## 2021-04-21 NOTE — Telephone Encounter (Signed)
Documentation from [place] [phone] regarding new Rx being needed without DAW=1 for Dexilant so insurance will pay or would you rather just start with the PA for Dexilant to see what happens. The pt's insurance will not cover the brand name. Please advise ?

## 2021-04-21 NOTE — Telephone Encounter (Signed)
Noted, thanks!

## 2021-04-21 NOTE — Telephone Encounter (Signed)
I sent prescription for generic. Thanks! ?

## 2021-04-21 NOTE — Addendum Note (Signed)
Addended by: Annitta Needs on: 04/21/2021 02:52 PM ? ? Modules accepted: Orders ? ?

## 2021-04-23 ENCOUNTER — Telehealth: Payer: Self-pay | Admitting: Internal Medicine

## 2021-04-23 NOTE — Telephone Encounter (Signed)
PATIENT CALLED AND SAID THAT SHE HAD ONE MORE REFILL LEFT ON HER REFLUX MEDICATION BUT THE PHARMACY WOULD NOT FILL IT AND TOLD HER TO CALL HERE.  SHE USES [facility] IN [place]  ?

## 2021-04-23 NOTE — Telephone Encounter (Signed)
Noted  ?Waiting on a PA form to be sent  ? ? ?Pt wants something else to try until medication is approved/denied. Pt aware  you are gone for the holiday  ?

## 2021-04-23 NOTE — Telephone Encounter (Signed)
Pt said her acid reflux medicine was going to be $1000 and the pharmacy was going to fax over PA forms and she is asking what can she take in the meantime. Please call [phone] ?

## 2021-04-23 NOTE — Telephone Encounter (Signed)
See other phone note

## 2021-04-27 ENCOUNTER — Telehealth: Payer: Self-pay | Admitting: Internal Medicine

## 2021-04-27 MED ORDER — PANTOPRAZOLE SODIUM 40 MG PO TBEC: 40.0000 mg | DELAYED_RELEASE_TABLET | Freq: Every day | ORAL | 3 refills | Status: DC

## 2021-04-27 NOTE — Telephone Encounter (Signed)
Lmom notifying patient that medication was sent in to her pharmacy.  ?

## 2021-04-27 NOTE — Telephone Encounter (Signed)
Lmom for pt to return my call.  

## 2021-04-27 NOTE — Telephone Encounter (Signed)
Pt returning call. [phone] ?

## 2021-04-27 NOTE — Telephone Encounter (Signed)
Looks like she is on Dexilant. I have sent in pantoprazole to take once daily, 30 minutes before breakfast.  ?

## 2021-04-27 NOTE — Addendum Note (Signed)
Addended by: Annitta Needs on: 04/27/2021 11:34 AM ? ? Modules accepted: Orders ? ?

## 2021-04-27 NOTE — Telephone Encounter (Signed)
See other note

## 2021-05-05 ENCOUNTER — Telehealth: Payer: Self-pay

## 2021-05-05 NOTE — Telephone Encounter (Signed)
The PA and appeal for Dexilant has been denied. Insurance company is requesting a letter stating why esomeprazole or famotidine is not suitable for this patient. I explained to them that this patient has been well controlled on this medication since 2016.  ?

## 2021-05-11 ENCOUNTER — Ambulatory Visit: Payer: Medicare PPO | Admitting: Gastroenterology

## 2021-05-11 NOTE — Telephone Encounter (Signed)
Noted. Patient will be seen 4/26. We can discuss further PPI therapy then.  ?

## 2021-05-11 NOTE — Progress Notes (Signed)
? ?GI Office Note   ? ?Referring Provider: Shary Key, DO ?Primary Care Physician:  Shary Key, DO ?Primary GI: Dr. Gala Romney ? ?Date:  05/13/2021  ?ID:  [name], DOB 21-Mar-1960, MRN [id number] ? ? ?Chief Complaint  ? ?Chief Complaint  ?Patient presents with  ? Dysphagia  ?  Throat sore, food getting stuck, choking   ? ? ? ?History of Present Illness  ?BETZY BARBIER is a 61 y.o. female with a history of anxiety, asthma, lung cancer, depression, HLD, GERD, chronic constipation, and chronic abdominal pain presenting today with complaint of sore throat, GERD, and dysphagia. ? ?Last seen in our clinic to 02/24/2021 -constipation controlled with Linzess 290 mcg daily, GERD also well controlled on Dexilant 60 mg daily.  Her chronic intermittent epigastric pain had become more constant despite avoiding NSAIDs, also had recurrent solid food dysphagia.  She was scheduled for EGD/dilation, CBC, CMP, lipase and advised to continue current medications.  It was also noted that she has chronic elevation in her alk phos with normal GGT, negative AMA and ASMA, normal transaminases and no CBD dilation. ? ?Last EGD 02/27/2021 -mild Schatzki's ring s/p dilation and biopsy, gastritis s/p biopsy, normal duodenum.  Pathology revealing reactive gastropathy. Prior EGD Dec 2021 also with Schatzki ring and dilation. ? ?Labs 03/16/2028 -CBC, CMP, lipase all normal except for mildly elevated glucose to 103. ? ?She called earlier this month stating that the pharmacy would not refill her Dexilant, generic Dexilant was sent into the pharmacy but reported it was going to be over thousand dollars so a PA was started for Dexilant which was denied as well as the appeal.  She was sent in pantoprazole 40 mg daily. ? ?Today: ? ?GERD: Was on dexilant. Insurance would not cover and appeal denied. Started on pantoprazole 40 mg daily on 4/10.  Continues to have upper abdominal discomfort, dysphagia, sour taste, burning in her throat.  Reports that  the pantoprazole has not been helpful at all.  She is open to trying the alternatives that her insurance did not only appeal/denial letter.  She does not believe she has tried famotidine or Nexium in the past.  Denies frequent belching, nausea/vomiting, hematemesis. ? ?Dysphagia: Reports that she continues to have some p.o., food, liquid dysphagia.  Reports pills her symptoms feel like they are hanging in her esophagus, she has left feels like choking episodes while in her saliva.  She reports she sometimes feels strangled with liquids.  No specific foods or liquids are more bothersome than others, she has been trying to cut up her foods into smaller bites.  It is not with every meal however she does report that symptoms food comes back up into her esophagus and she feels like she can feel it in her nose.   ? ?She has been complaining of allergy type symptoms and has had a cough with clear phlegm that sometimes makes her gag.  Has also been feeling stuffy ? ? ? ?Past Medical History:  ?Diagnosis Date  ? Anxiety   ? Asthma   ? every now and then.  Wheezing and coughing  ? [facility]) 1997  ? left lung  ? Chronic abdominal pain   ? Chronic back pain   ? Chronic joint pain   ? Depression   ? HLD (hyperlipidemia)   ? Palpitations 01/2013  ? chronic, intermittent  ? Shortness of breath dyspnea   ? with exertion  ? ? ?Past Surgical History:  ?Procedure Laterality  Date  ? ABDOMINAL HYSTERECTOMY    ? ABDOMINAL SURGERY    ? ovarian cyst removal  ? BALLOON DILATION N/A 02/27/2021  ? Procedure: BALLOON DILATION;  Surgeon: Eloise Harman, DO;  [facility];  Service: Endoscopy;  Laterality: N/A;  ? BIOPSY N/A 11/22/2013  ? Procedure: GASTRIC BIOPSY;  Surgeon: Daneil Dolin, MD;  Location: [facility];  Service: Endoscopy;  Laterality: N/A;  ? BIOPSY  02/27/2021  ? Procedure: BIOPSY;  Surgeon: Eloise Harman, DO;  [facility];  Service: Endoscopy;;  ? BLADDER SURGERY  suspension x4  ? x 4  ? CARPAL TUNNEL  RELEASE Right 07/11/2015  ? Procedure: RIGHT CARPAL TUNNEL RELEASE;  Surgeon: Jessy Oto, MD;  Location: [place];  Service: Orthopedics;  Laterality: Right;  ? CHOLECYSTECTOMY N/A 04/05/2014  ? Procedure: LAPAROSCOPIC CHOLECYSTECTOMY;  Surgeon: Aviva Signs Md, MD;  Location: [facility];  Service: General;  Laterality: N/A;  ? COCCYGECTOMY N/A 04/11/2015  ? Procedure: COCCYGECTOMY WITH OSTEOTOMY THROUGH AREA OF DEFORMITY;  Surgeon: Jessy Oto, MD;  Location: [place];  Service: Orthopedics;  Laterality: N/A;  ? COLONOSCOPY    ? COLONOSCOPY WITH PROPOFOL N/A 11/22/2013  ? Dr. Gala Romney: Grade 3 and 4/internal hemorrhoids???likely source of hematochezia Normal colonoscopy otherwise.   ? COLONOSCOPY WITH PROPOFOL N/A 09/15/2017  ? Dr. Emerson Monte: Hemorrhoids, next colonoscopy 10 years  ? ESOPHAGOGASTRODUODENOSCOPY (EGD) WITH PROPOFOL N/A 11/22/2013  ? Dr. Gala Romney: Incomplete Schatzki's ring dilated and disrupted as described above.  Small hiatal hernia. Focally abnormal gastric mucosa of uncertain significance status post biopsy, reactive gastropathy, negative H.pylori  ? ESOPHAGOGASTRODUODENOSCOPY (EGD) WITH PROPOFOL N/A 08/04/2015  ? Dr. Gala Romney: mild Schatzki's ring s/p dilation, small hiatal hernia   ? ESOPHAGOGASTRODUODENOSCOPY (EGD) WITH PROPOFOL N/A 03/17/2017  ? Mild Schatki's ring s/p dilation, small hiatal hernia, otherwise normal  ? ESOPHAGOGASTRODUODENOSCOPY (EGD) WITH PROPOFOL N/A 09/18/2018  ? Dr. Gala Romney: Schatzki ring status post dilation and disruption.  Hiatal hernia.  ? ESOPHAGOGASTRODUODENOSCOPY (EGD) WITH PROPOFOL N/A 12/20/2019  ? non-obstructing Schatzki's ring s/p dilation with 56, 58, and 60 F.  ? ESOPHAGOGASTRODUODENOSCOPY (EGD) WITH PROPOFOL N/A 02/27/2021  ? Procedure: ESOPHAGOGASTRODUODENOSCOPY (EGD) WITH PROPOFOL;  Surgeon: Eloise Harman, DO;  [facility];  Service: Endoscopy;  Laterality: N/A;  11:30am  ? EXCISION VAGINAL CYST Left 06/20/2012  ? Procedure: EXCISION LEFT  LABIAL CYST;  Surgeon: Jonnie Kind, MD;  [facility];  Service: Gynecology;  Laterality: Left;  ? LOBECTOMY Left   ? LUNG CANCER SURGERY  1997  ? age 48, resection only  ? MALONEY DILATION N/A 11/22/2013  ? Procedure: MALONEY DILATION;  Surgeon: Daneil Dolin, MD;  [facility]: [facility];  Service: Endoscopy;  Laterality: N/A;  56  ? MALONEY DILATION N/A 08/04/2015  ? Procedure: MALONEY DILATION;  Surgeon: Daneil Dolin, MD;  [facility];  Service: Endoscopy;  Laterality: N/A;  ? MALONEY DILATION N/A 03/17/2017  ? Procedure: MALONEY DILATION;  Surgeon: Daneil Dolin, MD;  [facility];  Service: Endoscopy;  Laterality: N/A;  ? MALONEY DILATION N/A 09/18/2018  ? Procedure: MALONEY DILATION;  Surgeon: Daneil Dolin, MD;  [facility];  Service: Endoscopy;  Laterality: N/A;  ? MALONEY DILATION N/A 12/20/2019  ? Procedure: MALONEY DILATION;  Surgeon: Daneil Dolin, MD;  [facility];  Service: Endoscopy;  Laterality: N/A;  ? MASS EXCISION N/A 07/23/2014  ? Procedure: EXCISIONAL BIOPSY NODULE LEFT  PARACOCCYGEAL AREA;  Surgeon: Jessy Oto, MD;  Location: [place];  Service: Orthopedics;  Laterality: N/A;  ? MASS EXCISION Left 10/10/2015  ? Procedure: EXCISIONAL BIOPSY OF LEFT DORSORADIAL FOREARM MASS LIPOMA;  Surgeon: Jessy Oto, MD;  Location: [place];  Service: Orthopedics;  Laterality: Left;  ? TUBAL LIGATION    ? ? ?Current Outpatient Medications  ?Medication Sig Dispense Refill  ? acyclovir (ZOVIRAX) 400 MG tablet Take 1 tablet by mouth twice daily 180 tablet 0  ? albuterol (PROVENTIL) (2.5 MG/3ML) 0.083% nebulizer solution USE 1 VIAL IN NEBULIZER EVERY 6 HOURS AS NEEDED FOR WHEEZING AND FOR SHORTNESS OF BREATH 75 mL 0  ? amitriptyline (ELAVIL) 150 MG tablet Take 1 tablet by mouth once daily 90 tablet 0  ? amLODipine (NORVASC) 10 MG tablet Take 1 tablet by mouth once daily 90 tablet 0  ? atorvastatin (LIPITOR) 40 MG tablet Take 1 tablet by  mouth once daily 90 tablet 3  ? budesonide-formoterol (SYMBICORT) 160-4.5 MCG/ACT inhaler Inhale 2 puffs into the lungs 2 (two) times daily. (Patient taking differently: Inhale 2 puffs into the lungs 2

## 2021-05-12 ENCOUNTER — Other Ambulatory Visit: Payer: Self-pay | Admitting: Family Medicine

## 2021-05-12 DIAGNOSIS — I10 Essential (primary) hypertension: Secondary | ICD-10-CM

## 2021-05-12 DIAGNOSIS — A609 Anogenital herpesviral infection, unspecified: Secondary | ICD-10-CM

## 2021-05-13 ENCOUNTER — Ambulatory Visit (INDEPENDENT_AMBULATORY_CARE_PROVIDER_SITE_OTHER): Payer: Medicare PPO | Admitting: Gastroenterology

## 2021-05-13 ENCOUNTER — Encounter: Payer: Self-pay | Admitting: Gastroenterology

## 2021-05-13 VITALS — BP 128/68 | HR 90 | Temp 97.4°F | Ht 65.0 in | Wt 175.0 lb

## 2021-05-13 DIAGNOSIS — K219 Gastro-esophageal reflux disease without esophagitis: Secondary | ICD-10-CM | POA: Diagnosis not present

## 2021-05-13 DIAGNOSIS — K5900 Constipation, unspecified: Secondary | ICD-10-CM | POA: Diagnosis not present

## 2021-05-13 DIAGNOSIS — R1319 Other dysphagia: Secondary | ICD-10-CM | POA: Diagnosis not present

## 2021-05-13 MED ORDER — ESOMEPRAZOLE MAGNESIUM 40 MG PO CPDR: 40.0000 mg | DELAYED_RELEASE_CAPSULE | Freq: Every day | ORAL | 2 refills | Status: DC

## 2021-05-13 NOTE — Patient Instructions (Signed)
I am sending in esomeprazole (Nexium) 40 mg for you to take daily.  You may pick up Pepcid (famotidine) over-the-counter, start with 10 mg once a day for breakthrough symptoms (save for later in the day). ? ?Also advise to pick up nondrowsy allergy medication such as Claritin, Allegra, or Zyrtec (generic is fine).  I believe this will help with your allergies and asthma symptoms as well as the production of phlegm that you have been having.  I also feel like this may help your sore throat some along with control of your GERD. ? ?Since you did not have any improvement of swallowing after EGD and dilation I suspect you may have some sort of motility issue with the esophagus or it could be from uncontrolled GERD.  We will trial esomeprazole and allergy medications as stated and can pursue further testing if symptoms do not improve.  Continue to cut up your foods into small bites and alternate with sips of liquids.  I would try to stick to a softer diet and avoid fatty/greasy foods. ? ?It was a pleasure to see you today. I want to create trusting relationships with patients. If you receive a survey regarding your visit,  I greatly appreciate you taking time to fill this out on paper or through your MyChart. I value your feedback. ? ?Venetia Night, MSN, FNP-BC, AGACNP-BC ?[facility] ? ? ?

## 2021-05-18 ENCOUNTER — Telehealth: Payer: Self-pay

## 2021-05-18 ENCOUNTER — Ambulatory Visit (INDEPENDENT_AMBULATORY_CARE_PROVIDER_SITE_OTHER): Payer: Medicare PPO | Admitting: Family Medicine

## 2021-05-18 ENCOUNTER — Other Ambulatory Visit: Payer: Self-pay

## 2021-05-18 ENCOUNTER — Other Ambulatory Visit: Payer: Self-pay | Admitting: Family Medicine

## 2021-05-18 ENCOUNTER — Encounter: Payer: Self-pay | Admitting: Family Medicine

## 2021-05-18 VITALS — BP 141/70 | HR 93 | Wt 177.0 lb

## 2021-05-18 DIAGNOSIS — K130 Diseases of lips: Secondary | ICD-10-CM | POA: Diagnosis not present

## 2021-05-18 DIAGNOSIS — I10 Essential (primary) hypertension: Secondary | ICD-10-CM | POA: Diagnosis not present

## 2021-05-18 DIAGNOSIS — R208 Other disturbances of skin sensation: Secondary | ICD-10-CM

## 2021-05-18 DIAGNOSIS — M79674 Pain in right toe(s): Secondary | ICD-10-CM

## 2021-05-18 DIAGNOSIS — R7303 Prediabetes: Secondary | ICD-10-CM | POA: Diagnosis not present

## 2021-05-18 LAB — POCT GLYCOSYLATED HEMOGLOBIN (HGB A1C): HbA1c, POC (prediabetic range): 5.9 % (ref 5.7–6.4)

## 2021-05-18 MED ORDER — CAPSAICIN 0.035 % EX CREA: TOPICAL_CREAM | CUTANEOUS | 0 refills | Status: DC

## 2021-05-18 MED ORDER — TERBINAFINE HCL 1 % EX CREA: 1.0000 "application " | TOPICAL_CREAM | Freq: Two times a day (BID) | CUTANEOUS | 0 refills | Status: DC

## 2021-05-18 NOTE — Patient Instructions (Addendum)
It was great seeing you today! ? ?You came in for toes burning and we checked your sugars which is still in a good range at 5.9. You can use capsaicin cream as needed on your foot and toes to help with the tingling. Keep area well moisturized with vaseline or moisturizing lotion.  ? ?I have also placed a referral to podiatry for your toe pain. They will call you in the next 1-2 weeks to schedule an appointment.  ? ?For your lips I have prescribed Lamisil to use twice a day.  ? ?Return if numbness and tingling worsens  ? ?Feel free to call with any questions or concerns at any time, at [phone]. ?  ?Take care,  ?Dr. Shary Key ?[place]  ?

## 2021-05-18 NOTE — Telephone Encounter (Signed)
Patient calls nurse line reporting she forgot to get handicap placard form back before she left.  ? ?Patient would like this mailed to her.  ? ?Will forward to PCP.  ?

## 2021-05-18 NOTE — Progress Notes (Addendum)
? ? ?  SUBJECTIVE:  ? ?CHIEF COMPLAINT / HPI:  ? ?[name] is a 61 yo who presents for burning at bottom of foot and toes. States she has to walk on heel because she cant put pressure on her foot due to the pain. Has occurred for the past 2 months. Sometimes will go up leg. Does take Lyrica and Cymbalta for her neuropathy.  ? ?Also endorses cracking at the corners of lips. [name], Not drinking much water. Denies feeling thirsty or urinating a lot.  ? ? ?OBJECTIVE:  ? ?BP (!) 141/70   Pulse 93   Wt 177 lb (80.3 kg)   SpO2 98%   BMI 29.45 kg/m?   ? ?General: alert, pleasant, NAD ?HEENT: dry and cracked corners of mouth bilaterally ?CV: RRR no murmurs  ?Resp: CTAB normal WOB ?GI: soft, non distended  ? ?R and L Foot: ?Inspection:  hallux valgus of 1st toe b/l. !st R toe with thickened and hyperpigmented nail. No swelling, erythema, or bruising b/l.  Normal arch b/l ?Palpation: Mild tenderness to palpation at base of 1st toes b/l. Decreased sensation of plantar aspect of R foot  ?ROM: Full  ROM of the ankle b/l. Normal midfoot flexibility b/l ?Strength: 5/5 strength ankle in all planes b/l ?Neurovascular: N/V intact distally in the lower extremity b/l ?Special tests: Negative anterior drawer. Negative squeeze. normal midfoot flexibility.  ? ?ASSESSMENT/PLAN:  ? ?HYPERTENSION, BENIGN SYSTEMIC ?BP 141/70. Asymptomatic. Does not take BP at home. Currently taking Amlodipine 10mg  daily, lasix 20mg  prn for swelling. Advised patient to take some blood pressure measurements at home to see if we need to adjust medication.  ? ?Cheilosis ?Patient with dryness and cracking at the corners of her mouth bilaterally. Not relieved with chapstick or abreva. Likely cheilosis and prescribed Lamisil cream to use twice a day for a week.  ? ?Burning sensation of toe and foot ?Patient presents with bilateral foot burning and numbness. Given bony deformities on exam including hallux valgus of 1st toe bilaterally pain likely due to  arthritis as well as peripheral neuropathy given decreased sensation (R>L). Also with thickened and hyperpigmented R 1st toe. Less likely gout given given no 1st toe erythema. Advised to continue Cymbalta, Lyrica, Amitriptyline. Checked a1c Prescribed capsaicin cream to see if she can get additional relief. Referred to podiatry.  ?  ? ?Shary Key, DO ?[place]   ?

## 2021-05-19 ENCOUNTER — Other Ambulatory Visit: Payer: Self-pay | Admitting: Family Medicine

## 2021-05-19 NOTE — Telephone Encounter (Signed)
Placed in ongoing mail.  ?

## 2021-05-21 DIAGNOSIS — K130 Diseases of lips: Secondary | ICD-10-CM | POA: Insufficient documentation

## 2021-05-21 DIAGNOSIS — R208 Other disturbances of skin sensation: Secondary | ICD-10-CM | POA: Insufficient documentation

## 2021-05-21 NOTE — Assessment & Plan Note (Signed)
BP 141/70. Asymptomatic. Does not take BP at home. Currently taking Amlodipine 10mg  daily, lasix 20mg  prn for swelling. Advised patient to take some blood pressure measurements at home to see if we need to adjust medication.  ?

## 2021-05-21 NOTE — Assessment & Plan Note (Addendum)
Patient with dryness and cracking at the corners of her mouth bilaterally. Not relieved with chapstick or abreva. Likely cheilosis and prescribed Lamisil cream to use twice a day for a week.  ?

## 2021-05-21 NOTE — Assessment & Plan Note (Addendum)
Patient presents with bilateral foot burning and numbness. Given bony deformities on exam including hallux valgus of 1st toe bilaterally pain likely due to arthritis as well as peripheral neuropathy given decreased sensation (R>L). Also with thickened and hyperpigmented R 1st toe. Less likely gout given given no 1st toe erythema. Advised to continue Cymbalta, Lyrica, Amitriptyline. Checked a1c Prescribed capsaicin cream to see if she can get additional relief. Referred to podiatry.  ?

## 2021-05-26 ENCOUNTER — Other Ambulatory Visit: Payer: Self-pay | Admitting: Family Medicine

## 2021-05-26 DIAGNOSIS — M48062 Spinal stenosis, lumbar region with neurogenic claudication: Secondary | ICD-10-CM

## 2021-06-05 ENCOUNTER — Telehealth: Payer: Self-pay | Admitting: Internal Medicine

## 2021-06-05 NOTE — Telephone Encounter (Signed)
Spoke with patient and she states that she was given another patient's lab orders at her last office visit on 05/13/2021 when she was in to see Venetia Night NP. Instructed patient to shred them up and discard of them.

## 2021-06-05 NOTE — Telephone Encounter (Signed)
Please call patient, she was sent the wrong persons lab order

## 2021-06-08 NOTE — Telephone Encounter (Signed)
She did, but I did not record the pt's name. They were more than likely printed around the same time that her AVS printed and the nurse picked them up together not realizing it.

## 2021-06-22 ENCOUNTER — Other Ambulatory Visit: Payer: Self-pay | Admitting: Family Medicine

## 2021-06-22 ENCOUNTER — Ambulatory Visit: Payer: Medicare PPO | Admitting: Podiatry

## 2021-06-23 ENCOUNTER — Encounter: Payer: Self-pay | Admitting: *Deleted

## 2021-06-23 ENCOUNTER — Other Ambulatory Visit: Payer: Self-pay | Admitting: Family Medicine

## 2021-06-25 ENCOUNTER — Telehealth: Payer: Self-pay

## 2021-06-25 NOTE — Telephone Encounter (Signed)
Patient calls nurse line reporting UTI symptoms for ~ 2 days.   Patient reports dysuria, urinary frequency and little output.   Patient denies fevers, chills, hematuria, abdominal pain or flank pain.   Apt offered for patient. Patient reports she has no car and no way to come in.   Red flags discussed with patient.   Will froward to PCP.

## 2021-06-26 ENCOUNTER — Telehealth: Payer: Self-pay | Admitting: Family Medicine

## 2021-06-26 MED ORDER — CEPHALEXIN 500 MG PO CAPS: 500.0000 mg | ORAL_CAPSULE | Freq: Two times a day (BID) | ORAL | 0 refills | Status: AC

## 2021-06-26 NOTE — Telephone Encounter (Signed)
Patient called nurse line with concern for UTI. Not able to come to the clinic due to transportation issues. Has urinary frequency, burning, feels like she is not emptying out all the way for the past week. This is how her UTIs have felt in the past. Denies allergies to abx. Gave strict return precautions. Pharmacy [facility] in [place]. Will send in Keflex BID for 7 days. If symptoms persist advised patient to come in for evaluation and UA/culture

## 2021-06-29 ENCOUNTER — Telehealth: Payer: Self-pay | Admitting: *Deleted

## 2021-06-29 NOTE — Telephone Encounter (Signed)
Received approval letter for Esomeprazole for pt. Copy placed on providers desk and sent to the scan center.

## 2021-07-01 NOTE — Telephone Encounter (Signed)
Returned the pt's call from message left on my vm. Pt was asking about her reflux medication. I advised the pt that it was approved and to go to her pharmacy and pick it up. It was approved on 06-29-2021

## 2021-07-02 ENCOUNTER — Ambulatory Visit: Payer: Medicare PPO | Admitting: Gastroenterology

## 2021-07-22 ENCOUNTER — Other Ambulatory Visit: Payer: Self-pay | Admitting: Family Medicine

## 2021-07-27 ENCOUNTER — Ambulatory Visit: Payer: Medicare PPO | Admitting: Podiatry

## 2021-08-03 ENCOUNTER — Telehealth: Payer: Self-pay

## 2021-08-03 NOTE — Telephone Encounter (Signed)
Patient LVM on nurse line regarding positive COVID results.   Returned call to patient.  Patient reports that she tested positive yesterday, 7/16. She says that symptoms "started sometime last week."   Patient reports body aches, headache and diarrhea. Denies fever, cough, nasal congestion.  Advised of supportive measures. Patient reports that she feels improvement of symptoms.   Patient will return call if symptoms worsen or if she does not improve.   Talbot Grumbling, RN

## 2021-08-12 ENCOUNTER — Other Ambulatory Visit: Payer: Self-pay | Admitting: Family Medicine

## 2021-08-12 DIAGNOSIS — A609 Anogenital herpesviral infection, unspecified: Secondary | ICD-10-CM

## 2021-08-12 DIAGNOSIS — I10 Essential (primary) hypertension: Secondary | ICD-10-CM

## 2021-08-13 ENCOUNTER — Ambulatory Visit: Payer: Medicare PPO | Admitting: Gastroenterology

## 2021-08-26 ENCOUNTER — Other Ambulatory Visit: Payer: Self-pay | Admitting: Gastroenterology

## 2021-08-26 DIAGNOSIS — K219 Gastro-esophageal reflux disease without esophagitis: Secondary | ICD-10-CM

## 2021-09-16 ENCOUNTER — Other Ambulatory Visit: Payer: Self-pay | Admitting: Gastroenterology

## 2021-09-16 ENCOUNTER — Other Ambulatory Visit: Payer: Self-pay | Admitting: Family Medicine

## 2021-09-16 DIAGNOSIS — K219 Gastro-esophageal reflux disease without esophagitis: Secondary | ICD-10-CM

## 2021-09-16 DIAGNOSIS — E7800 Pure hypercholesterolemia, unspecified: Secondary | ICD-10-CM

## 2021-09-24 ENCOUNTER — Other Ambulatory Visit: Payer: Self-pay | Admitting: Family Medicine

## 2021-10-26 ENCOUNTER — Other Ambulatory Visit: Payer: Self-pay | Admitting: Family Medicine

## 2021-10-26 ENCOUNTER — Other Ambulatory Visit: Payer: Self-pay | Admitting: Gastroenterology

## 2021-10-26 DIAGNOSIS — K219 Gastro-esophageal reflux disease without esophagitis: Secondary | ICD-10-CM

## 2021-11-10 ENCOUNTER — Other Ambulatory Visit: Payer: Self-pay | Admitting: Family Medicine

## 2021-11-10 DIAGNOSIS — I10 Essential (primary) hypertension: Secondary | ICD-10-CM

## 2021-11-18 ENCOUNTER — Other Ambulatory Visit: Payer: Self-pay | Admitting: Family Medicine

## 2021-11-18 DIAGNOSIS — A609 Anogenital herpesviral infection, unspecified: Secondary | ICD-10-CM

## 2021-11-22 ENCOUNTER — Other Ambulatory Visit: Payer: Self-pay | Admitting: Family Medicine

## 2021-11-22 DIAGNOSIS — M48062 Spinal stenosis, lumbar region with neurogenic claudication: Secondary | ICD-10-CM

## 2021-11-23 ENCOUNTER — Other Ambulatory Visit: Payer: Self-pay | Admitting: Family Medicine

## 2021-11-23 ENCOUNTER — Other Ambulatory Visit: Payer: Self-pay | Admitting: Gastroenterology

## 2021-11-23 DIAGNOSIS — K219 Gastro-esophageal reflux disease without esophagitis: Secondary | ICD-10-CM

## 2021-11-23 DIAGNOSIS — M48062 Spinal stenosis, lumbar region with neurogenic claudication: Secondary | ICD-10-CM

## 2021-12-18 ENCOUNTER — Other Ambulatory Visit: Payer: Self-pay | Admitting: Family Medicine

## 2021-12-18 DIAGNOSIS — E7800 Pure hypercholesterolemia, unspecified: Secondary | ICD-10-CM

## 2021-12-24 ENCOUNTER — Other Ambulatory Visit: Payer: Self-pay | Admitting: Family Medicine

## 2022-01-04 ENCOUNTER — Ambulatory Visit: Payer: Medicare PPO | Admitting: Family Medicine

## 2022-01-21 ENCOUNTER — Encounter: Payer: Self-pay | Admitting: Family Medicine

## 2022-01-21 ENCOUNTER — Ambulatory Visit (INDEPENDENT_AMBULATORY_CARE_PROVIDER_SITE_OTHER): Payer: Medicare PPO | Admitting: Family Medicine

## 2022-01-21 ENCOUNTER — Other Ambulatory Visit: Payer: Self-pay | Admitting: Family Medicine

## 2022-01-21 VITALS — BP 152/69 | HR 88 | Wt 171.0 lb

## 2022-01-21 DIAGNOSIS — R300 Dysuria: Secondary | ICD-10-CM

## 2022-01-21 DIAGNOSIS — F329 Major depressive disorder, single episode, unspecified: Secondary | ICD-10-CM

## 2022-01-21 DIAGNOSIS — F339 Major depressive disorder, recurrent, unspecified: Secondary | ICD-10-CM | POA: Diagnosis not present

## 2022-01-21 LAB — POCT URINALYSIS DIP (MANUAL ENTRY)
Bilirubin, UA: NEGATIVE
Glucose, UA: NEGATIVE mg/dL
Ketones, POC UA: NEGATIVE mg/dL
Nitrite, UA: NEGATIVE
Protein Ur, POC: NEGATIVE mg/dL
Spec Grav, UA: 1.015 (ref 1.010–1.025)
Urobilinogen, UA: 1 E.U./dL
pH, UA: 7 (ref 5.0–8.0)

## 2022-01-21 LAB — POCT UA - MICROSCOPIC ONLY

## 2022-01-21 MED ORDER — MIRTAZAPINE 7.5 MG PO TABS: 7.5000 mg | ORAL_TABLET | Freq: Every day | ORAL | 0 refills | Status: DC

## 2022-01-21 MED ORDER — AMITRIPTYLINE HCL 75 MG PO TABS: 75.0000 mg | ORAL_TABLET | Freq: Every day | ORAL | 0 refills | Status: DC

## 2022-01-21 NOTE — Progress Notes (Signed)
    SUBJECTIVE:   CHIEF COMPLAINT / HPI:   Patient presents for increased anxiety and depression. States nerves are messed up can't sleep or concentrate for the past 6 or 7 months. States she cries a lot. States people come to her for their problems. Symptoms worsened because sister has dementia and was placed in a care facility and she feels sad that she can't see her because she doesn't have a car which is really affecting her. States she may sleep for 2 hours and then is up for the rest of the night. Denies SI/HI. Currently on Amitriptyline 150mg  daily and Cymbalta 60mg  daily. She wanted to start therapy but she called her insurance and it wasn't in her plan   Additionally, endorses dysuria, itching, and frequency for the past 3-4 months. Denies fevers/chills. Not sexually active  Open to care management referral for resources - transportation, therapy, medication assistance   Requests refills of cymbalta, symbicort   PERTINENT  PMH / PSH: Reviewed   OBJECTIVE:   BP (!) 152/69   Pulse 88   Wt 171 lb (77.6 kg)   SpO2 99%   BMI 28.46 kg/m    Physical exam General: well appearing, NAD Cardiovascular: RRR, no murmurs Lungs: CTAB. Normal WOB Abdomen: soft, non-distended, non-tender Skin: warm, dry. No edema Psych: mood down, tearful. Affect normal. Speech on tangential. Thought content normal   ASSESSMENT/PLAN:   Dysuria Patient has been experiencing dysuria, itching, and frequency for the past 3-4 months. States she did not come in sooner due to lack of transportation. Similar to previous UTIs. UA with trace leukocytes. Denies being sexually active so will not check for STIs at this time. Will treat for UTI with Keflex 500mg  BID x 5 days.   Major depressive disorder, recurrent episode (HCC) PHQ9 score of 18 and GAD score of 18. Experiencing increased anxiety and sadness for the past 6-7 months affecting her sleep and overall quality of life. A lot of this is worsened by the fact  that she does not have transportation to see her sister who had dementia. Denies SI/HI. Currently on Amitriptyline 150mg  daily and Cymbalta 60mg  daily. She would like to start therapy but was told by her insurance that it is not covered. Will adjust her medication and add low dose Mirtazapine 7.5mg  tablet at bedtime to also help with sleep, reduce Amitriptyline to 75mg  daily and continue Cymbalta 60mg . Placed referral (patient agreeable to) for community care coordination for assistance finding counseling, transportation and medication assistance.     [place]

## 2022-01-21 NOTE — Patient Instructions (Addendum)
It was great seeing you today! Im sorry you have been gong through so much! I have placed a referral for someone from our care management team to assist you with therapy, medications and transportation.   We will start you on a low dose medication to help with your symptoms called Remeron to take at night. Also cut your Amitriptyline in half we are changing the dose to 75. Continue your Cymbalta.   We will follow up on the results from your urine. If positive I will call you and sent in treatment.,   Please check-out at the front desk before leaving the clinic. I'd like to see you back in 2-3 weeks to check on the medication (you can also set up a virtual visit)  but if you need to be seen earlier than that for any new issues we're happy to fit you in, just give [place] a call!  Visit Reminders: - Stop by the pharmacy to pick up your prescriptions  - Continue to work on your healthy eating habits and incorporating exercise into your daily life. Feel free to call with any questions or concerns at any time, at [phone].   Take care,  Dr. Shary Key [facility]

## 2022-01-22 ENCOUNTER — Telehealth: Payer: Self-pay

## 2022-01-22 ENCOUNTER — Telehealth: Payer: Self-pay | Admitting: *Deleted

## 2022-01-22 MED ORDER — DULOXETINE HCL 60 MG PO CPEP: 60.0000 mg | ORAL_CAPSULE | Freq: Two times a day (BID) | ORAL | 0 refills | Status: DC

## 2022-01-22 MED ORDER — BUDESONIDE-FORMOTEROL FUMARATE 160-4.5 MCG/ACT IN AERO: 2.0000 | INHALATION_SPRAY | Freq: Two times a day (BID) | RESPIRATORY_TRACT | 3 refills | Status: DC

## 2022-01-22 MED ORDER — CEPHALEXIN 500 MG PO CAPS: 500.0000 mg | ORAL_CAPSULE | Freq: Two times a day (BID) | ORAL | 0 refills | Status: AC

## 2022-01-22 NOTE — Assessment & Plan Note (Signed)
PHQ9 score of 18 and GAD score of 18. Experiencing increased anxiety and sadness for the past 6-7 months affecting her sleep and overall quality of life. A lot of this is worsened by the fact that she does not have transportation to see her sister who had dementia. Denies SI/HI. Currently on Amitriptyline 150mg  daily and Cymbalta 60mg  daily. She would like to start therapy but was told by her insurance that it is not covered. Will adjust her medication and add low dose Mirtazapine 7.5mg  tablet at bedtime to also help with sleep, reduce Amitriptyline to 75mg  daily and continue Cymbalta 60mg . Placed referral (patient agreeable to) for community care coordination for assistance finding counseling, transportation and medication assistance.

## 2022-01-22 NOTE — Telephone Encounter (Signed)
   Telephone encounter was:  Unsuccessful.  01/22/2022 Name: [name] MRN: [id number] DOB: March 18, 1960  Unsuccessful outbound call made today to assist with:  Transportation Needs  and medication.  Outreach Attempt:  1st Attempt  A HIPAA compliant voice message was left requesting a return call.  Instructed patient to call back at [phone].  [place] Resource Care Guide   [email or web address]  ?? [phone]   Website: [email or web address]  [email or web address]

## 2022-01-22 NOTE — Assessment & Plan Note (Signed)
Patient has been experiencing dysuria, itching, and frequency for the past 3-4 months. States she did not come in sooner due to lack of transportation. Similar to previous UTIs. UA with trace leukocytes. Denies being sexually active so will not check for STIs at this time. Will treat for UTI with Keflex 500mg  BID x 5 days.

## 2022-01-22 NOTE — Progress Notes (Signed)
  Care Coordination   Note   01/22/2022 Name: DESERE GWIN MRN: [id number] DOB: 02-26-1960  [name] is a 62 y.o. year old female who sees Shary Key, DO for primary care. I reached out to [name] by phone today to offer care coordination services.  Ms. Kovack was given information about Care Coordination services today including:   The Care Coordination services include support from the care team which includes your Nurse Coordinator, Clinical Social Worker, or Pharmacist.  The Care Coordination team is here to help remove barriers to the health concerns and goals most important to you. Care Coordination services are voluntary, and the patient may decline or stop services at any time by request to their care team member.   Care Coordination Consent Status: Patient agreed to services and verbal consent obtained.   Follow up plan:  Telephone appointment with care coordination team member scheduled for:  01/29/22  Encounter Outcome:  Pt. Scheduled  [place]  Direct Dial: [phone]

## 2022-01-23 LAB — URINE CULTURE

## 2022-01-25 ENCOUNTER — Other Ambulatory Visit: Payer: Self-pay | Admitting: Family Medicine

## 2022-01-25 ENCOUNTER — Telehealth: Payer: Self-pay

## 2022-01-25 NOTE — Telephone Encounter (Signed)
Reached out regarding medication affordability issues. Left message requesting call back at the office.

## 2022-01-25 NOTE — Telephone Encounter (Signed)
   Telephone encounter was:  Successful.  01/25/2022 Name: VETRA SHINALL MRN: [id number] DOB: 1960/07/16  Elisabeth Cara Pincock is a 62 y.o. year old female who is a primary care patient of Shary Key, DO . The community resource team was consulted for assistance with Transportation Needs  and medication.  Care guide performed the following interventions: Spoke with patient about sending a referral to [facility] transportation, patient gave permission. Sent [id number] referral to [facility] transportation. Gave patient contact information for [facility] [place] [phone].  Follow Up Plan:  No further follow up planned at this time. The patient has been provided with needed resources.  [place] Resource Care Guide   [email or web address]  ?? [phone]   Website: [email or web address]  [email or web address]

## 2022-01-29 ENCOUNTER — Encounter: Payer: Self-pay | Admitting: *Deleted

## 2022-01-29 ENCOUNTER — Telehealth: Payer: Self-pay | Admitting: *Deleted

## 2022-01-29 NOTE — Patient Outreach (Signed)
  Care Coordination   01/29/2022 Name: SHANDREKA DANTE MRN: [id number] DOB: 01/01/1961   Care Coordination Outreach Attempts:  An unsuccessful telephone outreach was attempted today to offer the patient information about available care coordination services as a benefit of their health plan.   Follow Up Plan:  Additional outreach attempts will be made to offer the patient care coordination information and services.   Encounter Outcome:  No Answer   Care Coordination Interventions:  No, not indicated    Reece Levy MSW, LCSW Licensed Clinical Social Worker    [phone]

## 2022-02-01 ENCOUNTER — Telehealth: Payer: Self-pay | Admitting: *Deleted

## 2022-02-01 NOTE — Progress Notes (Unsigned)
  Care Coordination Note  02/01/2022 Name: [name] MRN: [id number] DOB: [birth year]  [name] is a 62 y.o. year old female who is a primary care patient of Cora Collum, DO and is actively engaged with the care management team. I reached out to [name] by phone today to assist with re-scheduling an initial visit with the Licensed Clinical Social Worker  Follow up plan: Unsuccessful telephone outreach attempt made. A HIPAA compliant phone message was left for the patient providing contact information and requesting a return call.  [facility]  Care Coordination Care Guide  Direct Dial: [phone]

## 2022-02-02 ENCOUNTER — Other Ambulatory Visit: Payer: Self-pay | Admitting: Family Medicine

## 2022-02-02 ENCOUNTER — Ambulatory Visit: Payer: Self-pay | Admitting: Licensed Clinical Social Worker

## 2022-02-02 DIAGNOSIS — I10 Essential (primary) hypertension: Secondary | ICD-10-CM

## 2022-02-02 NOTE — Patient Outreach (Signed)
  Care Coordination   02/02/2022 Name: [name] MRN: [id number] DOB: 24-Nov-1960   Care Coordination Outreach Attempts:  An unsuccessful telephone outreach was attempted today to offer the patient information about available care coordination services as a benefit of their health plan.   Follow Up Plan:  Additional outreach attempts will be made to offer the patient care coordination information and services.   Encounter Outcome:  No Answer   Care Coordination Interventions:  No, not indicated    Kelton Pillar.Ociel Retherford MSW, LCSW Licensed Visual merchandiser [facility] Care Management [phone]

## 2022-02-02 NOTE — Progress Notes (Signed)
  Care Coordination Note  02/02/2022 Name: [name] MRN: [id number] DOB: 12-28-1960  [name] is a 62 y.o. year old female who is a primary care patient of Cora Collum, DO and is actively engaged with the care management team. I reached out to [name] by phone today to assist with re-scheduling an initial visit with the Licensed Clinical Social Worker  Follow up plan: Telephone appointment with care management team member scheduled for:02/02/22  [facility]  Care Coordination Care Guide  Direct Dial: [phone]

## 2022-02-03 ENCOUNTER — Telehealth: Payer: Self-pay | Admitting: *Deleted

## 2022-02-03 NOTE — Progress Notes (Signed)
  Care Coordination Note  02/03/2022 Name: [name] MRN: [id number] DOB: [birth year]  [name] is a 62 y.o. year old female who is a primary care patient of Cora Collum, DO and is actively engaged with the care management team. I reached out to [name] by phone today to assist with re-scheduling an initial visit with the Licensed Clinical Social Worker  Follow up plan: A second unsuccessful telephone outreach attempt made. A HIPAA compliant phone message was left for the patient providing contact information and requesting a return call.   [facility]  Care Coordination Care Guide  Direct Dial: [phone]

## 2022-02-05 NOTE — Progress Notes (Signed)
  Care Coordination Note  02/05/2022 Name: NAILEA WHITEHORN MRN: [id number] DOB: 05/17/1960  Judi Cong is a 62 y.o. year old female who is a primary care patient of Cora Collum, DO and is actively engaged with the care management team. I reached out to Judi Cong by phone today to assist with re-scheduling an initial visit with the Licensed Clinical Social Worker  Follow up plan: Patient declines further follow up and engagement by the care management team. Appropriate care team members and provider have been notified via electronic communication.   [facility]  Care Coordination Care Guide  Direct Dial: [phone]

## 2022-02-07 DIAGNOSIS — Z79899 Other long term (current) drug therapy: Secondary | ICD-10-CM | POA: Diagnosis not present

## 2022-02-07 DIAGNOSIS — J45909 Unspecified asthma, uncomplicated: Secondary | ICD-10-CM | POA: Diagnosis not present

## 2022-02-07 DIAGNOSIS — I10 Essential (primary) hypertension: Secondary | ICD-10-CM | POA: Diagnosis not present

## 2022-02-07 DIAGNOSIS — L989 Disorder of the skin and subcutaneous tissue, unspecified: Secondary | ICD-10-CM | POA: Diagnosis not present

## 2022-02-08 ENCOUNTER — Telehealth (INDEPENDENT_AMBULATORY_CARE_PROVIDER_SITE_OTHER): Payer: Medicare PPO | Admitting: Family Medicine

## 2022-02-08 DIAGNOSIS — F329 Major depressive disorder, single episode, unspecified: Secondary | ICD-10-CM

## 2022-02-08 MED ORDER — MIRTAZAPINE 15 MG PO TABS: 15.0000 mg | ORAL_TABLET | Freq: Every day | ORAL | 0 refills | Status: DC

## 2022-02-08 NOTE — Progress Notes (Signed)
[facility] Telemedicine Visit  Patient consented to have virtual visit and was identified by name and date of birth. Method of visit: Telephone  Encounter participants: Patient: [name] - located at home Provider: Cora Collum - located at [facility] clinic  Others (if applicable): n/a  Chief Complaint: follow up on new medication  HPI:  Patient presents on the for follow up on medication started at last visit.  She was been in clinic on 1/4 for worsening of her depression, and we added mirtazapine 7.5 mg, decrease her amitriptyline to total 75 mg daily, and continued on Cymbalta 60 mg.  Today she notes no difference in mood.  Does endorse being able to tolerate it well without any side effects.  She is asking if she can take a higher dose.  Additionally, she states that she has been sick for 2 days. Has a sore on her side. Husband thought it was a mole. States it was a tick and sister pulled most of it out but had to stop because it hurt so bad. Went to urgent care yesterday and was given an antibiotic cream to use twice a day for 7 days.    ROS: per HPI  Pertinent PMHx: Reviewed   Exam:  There were no vitals taken for this visit.  Did not perform   Assessment/Plan:  No problem-specific Assessment & Plan notes found for this encounter.   MDD Started on mirtazapine 7.5mg  on 1/4, in addition to lowering her amitriptyline to 75mg  and continuing Cymbalta 60mg . She is tolerating the addition well without any side effects and would like to increase Mirtazapine. Will increase to 15mg  daily.  Vies her to let me know if she has any side effects including nausea, palpitations, GI symptoms, etc.   Scheduled follow-up appointment with me on 2/2 to check on mood, and also to look at her skin to check if the tick has been removed.  Time spent during visit with patient: 10 minutes

## 2022-02-19 ENCOUNTER — Ambulatory Visit: Payer: Self-pay | Admitting: Family Medicine

## 2022-02-19 ENCOUNTER — Telehealth: Payer: Self-pay | Admitting: Family Medicine

## 2022-02-19 NOTE — Telephone Encounter (Signed)
Patient called to let her doctor know that she had to stop taking her new medication because it was making her stomach hurt. Asks if the doctor could call her please

## 2022-02-20 ENCOUNTER — Other Ambulatory Visit: Payer: Self-pay | Admitting: Family Medicine

## 2022-02-20 DIAGNOSIS — A609 Anogenital herpesviral infection, unspecified: Secondary | ICD-10-CM

## 2022-02-22 ENCOUNTER — Other Ambulatory Visit: Payer: Self-pay | Admitting: Family Medicine

## 2022-03-03 ENCOUNTER — Telehealth: Payer: Self-pay | Admitting: *Deleted

## 2022-03-03 NOTE — Telephone Encounter (Signed)
Pt called and states food is getting stuck in her throat. She thinks it's time to have her throat stretched. I have informed her that she needs an OV before we can schedule a EGD.

## 2022-03-05 ENCOUNTER — Ambulatory Visit (INDEPENDENT_AMBULATORY_CARE_PROVIDER_SITE_OTHER): Payer: Medicare PPO | Admitting: Family Medicine

## 2022-03-05 ENCOUNTER — Encounter: Payer: Self-pay | Admitting: Family Medicine

## 2022-03-05 VITALS — BP 140/75 | HR 95 | Ht 65.0 in | Wt 170.6 lb

## 2022-03-05 DIAGNOSIS — F339 Major depressive disorder, recurrent, unspecified: Secondary | ICD-10-CM

## 2022-03-05 MED ORDER — BUPROPION HCL ER (XL) 150 MG PO TB24: 150.0000 mg | ORAL_TABLET | Freq: Every day | ORAL | 0 refills | Status: DC
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
## 2022-03-09 ENCOUNTER — Encounter: Payer: Self-pay | Admitting: Internal Medicine

## 2022-03-11 ENCOUNTER — Ambulatory Visit: Payer: Medicare PPO | Admitting: Physician Assistant

## 2022-03-15 ENCOUNTER — Telehealth: Payer: Self-pay

## 2022-03-15 NOTE — Telephone Encounter (Signed)
Patient calls nurse line in regards to Bupropion.  She reports she has been taking the medication since 2/17, however the medication started causing "a terrible taste in my mouth." She reports a bitter taste. She reports this has not been an issue until starting this medication. She reports she stopped taking the medication on Friday.   Patient advised to FU with scheduled an apt with PCP on 3/4.  Advised if symptoms worsen or fail to improve to schedule a clinic visit sooner. Encouraged hydration.

## 2022-03-15 NOTE — Telephone Encounter (Signed)
Covering for Dr. Arby Barrette. Noted and agree. Thankfully, since it is a new medication, low risk for adverse side effects of withdrawal.   Ezequiel Essex, MD

## 2022-03-16 ENCOUNTER — Ambulatory Visit (INDEPENDENT_AMBULATORY_CARE_PROVIDER_SITE_OTHER): Payer: Medicare PPO | Admitting: Physician Assistant

## 2022-03-16 ENCOUNTER — Ambulatory Visit (INDEPENDENT_AMBULATORY_CARE_PROVIDER_SITE_OTHER): Payer: Medicare PPO

## 2022-03-16 ENCOUNTER — Encounter: Payer: Self-pay | Admitting: Physician Assistant

## 2022-03-16 DIAGNOSIS — M25522 Pain in left elbow: Secondary | ICD-10-CM

## 2022-03-16 DIAGNOSIS — M25532 Pain in left wrist: Secondary | ICD-10-CM | POA: Diagnosis not present

## 2022-03-16 NOTE — Progress Notes (Signed)
Office Visit Note   Patient: [name]           Date of Birth: 1960-03-31           MRN: [id number] Visit Date: 03/16/2022              Requested by: Shary Key, DO [place],  [place] [id number] PCP: Shary Key, DO  Chief Complaint  Patient presents with   Left Wrist - Pain   Left Elbow - Pain      HPI: Patient is a pleasant 62 year old woman who is a former patient of Dr. Louanne Skye and also has had surgery with Dr. Lorin Mercy on her back before.  She is status post right carpal tunnel release with Dr. Merrilee Seashore on 2017.  She is also had a forearm lipoma removed on the volar surface of her left arm.  She comes in today complaining of numbness and weakness in her left hand.  Also a small painful cyst that is become prominent.  She also complains of a mass on her medial elbow.  She feels her hand is her biggest issue.  Assessment & Plan: Visit Diagnoses:  1. Pain in left elbow   2. Pain in left wrist     Plan: Exam consistent with carpal tunnel syndrome left hand.  She did have previous right carpal tunnel release in 2017 and did well.  She does believe that studies were done at that time around 2016 of both upper extremities.  She thought she had carpal tunnel in both but that the right was worse.  Unfortunately were unable to access the studies to confirm this.  We will go forward with a new study of the left.  She can go forward and follow-up with Dr. Lorin Mercy since he has seen her in the past for consideration of carpal tunnel release on the left.  With regards to her left elbow mass this is stable and has been studied with ultrasound and MRI 2 years ago with Dr. Louanne Skye and no surgical intervention was recommended.  The cyst on the volar radial side of her wrist is somewhat painful to her but would not consider aspiration considering its proximity to the radial artery.  Could discuss this with Dr. Lorin Mercy as well.  In the meantime we will give her a splint to use.  Follow-Up  Instructions: No follow-ups on file.   Ortho Exam  Patient is alert, oriented, no adenopathy, well-dressed, normal affect, normal respiratory effort. Examination of her left arm she has no redness no erythema she has palpable mass medially on the elbow very similar in exam to previous.  No real pain she has good active extension and flexion of her elbow.  She does have altered sensation especially in the thumb index and long finger.  Of the left wrist.  Grip strength is decreased on the left.  She does have a small volar cystic mass on the radial side of her wrist.  This is in close approximation to the radial artery.  There is no surrounding erythema.  Imaging: XR Elbow 2 Views Left  Result Date: 03/16/2022 2 views of the elbow were taken today.  She does have some mild degenerative changes.  No acute fractures are noted.  X-rays were compared to those of 2022 very similar.  XR Wrist Complete Left  Result Date: 03/16/2022 Radiographs of her left wrist were taken today no evidence of any acute injuries.  Some degenerative changes in the  base of the thumb.  No images are attached to the encounter.  Labs: Lab Results  Component Value Date   HGBA1C 5.9 05/18/2021   HGBA1C 5.8 (A) 11/20/2020   HGBA1C 5.5 12/10/2017   ESRSEDRATE 37 (H) 11/11/2020   ESRSEDRATE 13 10/10/2008   CRP 0.6 11/11/2020   REPTSTATUS 12/12/2017 FINAL 12/10/2017   GRAMSTAIN  07/23/2014    RARE WBC PRESENT, PREDOMINANTLY PMN NO ORGANISMS SEEN Performed at [facility]  07/23/2014    RARE WBC PRESENT, PREDOMINANTLY PMN NO ORGANISMS SEEN Performed at [facility], SUGGEST RECOLLECTION (A) 12/10/2017   LABORGA NO GROWTH 09/27/2012     Lab Results  Component Value Date   ALBUMIN 3.9 11/11/2020   ALBUMIN 4.0 04/01/2018   ALBUMIN 3.8 02/22/2018    Lab Results  Component Value Date   MG 2.6 (H) 12/12/2017   MG 1.8 12/11/2017   MG 1.2 (L)  12/10/2017   No results found for: "VD25OH"  No results found for: "PREALBUMIN"    Latest Ref Rng & Units 03/16/2021    7:47 AM 11/11/2020   12:12 PM 07/09/2020    9:47 AM  CBC EXTENDED  WBC 3.8 - 10.8 Thousand/uL 4.3  3.1  4.5   RBC 3.80 - 5.10 Million/uL 4.23  4.24  4.36   Hemoglobin 11.7 - 15.5 g/dL 13.3  12.5  13.8   HCT 35.0 - 45.0 % 38.9  38.8  40.8   Platelets 140 - 400 Thousand/uL 287  202  290   NEUT# 1,500 - 7,800 cells/uL 2,004  1.0  2,493   Lymph# 850 - 3,900 cells/uL 1,810  1.7  1,472      There is no height or weight on file to calculate BMI.  Orders:  Orders Placed This Encounter  Procedures   XR Wrist Complete Left   XR Elbow 2 Views Left   No orders of the defined types were placed in this encounter.    Procedures: No procedures performed  Clinical Data: No additional findings.  ROS:  All other systems negative, except as noted in the HPI. Review of Systems  Objective: Vital Signs: There were no vitals taken for this visit.  Specialty Comments:  No specialty comments available.  PMFS History: Patient Active Problem List   Diagnosis Date Noted   Cheilosis 05/21/2021   Burning sensation of toe and foot 05/21/2021   Abdominal pain, epigastric 123456   Lichen planus atrophicus 02/09/2021   Acute conjunctivitis of both eyes 03/25/2020   Radial tunnel syndrome 02/06/2019   Sacroiliac inflammation ([place]) 05/23/2018   Poor sleep hygiene 05/23/2018   Chronic obstructive pulmonary disease with (acute) exacerbation ([place]) 05/23/2018   Orthostatic hypotension 05/23/2018   Syncope and collapse 05/23/2018   Elevated fasting glucose 05/23/2018   Nocturia more than twice per night 05/23/2018   Snoring 05/23/2018   Hemorrhoid 02/15/2018   Abdominal distention 02/15/2018   Anemia 01/09/2018   Pain of upper abdomen 01/09/2018   Healthcare maintenance 12/30/2017   Antibiotic-associated diarrhea 12/22/2017   Vagina itching 12/22/2017   Sepsis ([place])     Respiratory distress    Partial small bowel obstruction ([place]) 12/11/2017   COPD with acute exacerbation ([place]) 12/11/2017   Hypophosphatemia 12/11/2017   Sepsis due to undetermined organism ([place]) 12/10/2017   AKI (acute kidney injury) ([place]) 12/10/2017   Hyperbilirubinemia 12/10/2017   Aortic regurgitation 123456   Diastolic dysfunction 123456   Dysphagia 01/20/2017  Left hip pain 01/19/2017   Right hand pain 01/19/2017   Diarrhea 10/11/2016   Right hip pain 06/07/2016   Dysuria 04/14/2016   Malaise and fatigue 04/14/2016   Breast pain, left 11/06/2015   Mass of left forearm 10/10/2015    Class: Chronic   Dysphagia, oropharyngeal    Gastroesophageal reflux disease without esophagitis    Carpal tunnel syndrome, right 07/11/2015    Class: Chronic   Prediabetes 07/09/2015   Lumbar stenosis with neurogenic claudication 01/05/2015   Spondylolisthesis of lumbar region 01/03/2015    Class: Chronic   Soft tissue mass 07/23/2014    Class: Acute   Coccygodynia 07/23/2014    Class: Acute   Constipation 02/15/2014   Dysphagia, pharyngoesophageal phase    Elevated alkaline phosphatase level 11/06/2013   Rectal bleeding 11/06/2013   Obesity, unspecified 02/15/2013   Genital herpes 10/12/2012   HSV (herpes simplex virus) anogenital infection 09/29/2012   UNSPECIFIED SLEEP APNEA 12/04/2008   ADENOCARCINOMA, LUNG 10/09/2008   Osteoarthritis of multiple joints 07/13/2006   HYPERCHOLESTEROLEMIA 03/17/2006   Major depressive disorder, recurrent episode ([place]) 03/17/2006   HYPERTENSION, BENIGN SYSTEMIC 03/17/2006   GASTROESOPHAGEAL REFLUX, NO ESOPHAGITIS 03/17/2006   Insomnia due to medical condition 03/17/2006   Past Medical History:  Diagnosis Date   Anxiety    Asthma    every now and then.  Wheezing and coughing   Cancer ([place]) 1997   left lung   Chronic abdominal pain    Chronic back pain    Chronic joint pain    Depression    HLD (hyperlipidemia)    Palpitations  01/2013   chronic, intermittent   Shortness of breath dyspnea    with exertion    Family History  Problem Relation Age of Onset   Diabetes Other    Heart disease Other        has pace maker   Hypertension Mother    Kidney disease Mother    Hypertension Father    Kidney disease Father    Heart disease Father    Cancer Father        leukemia   Hypertension Sister    Hypertension Sister    Colon cancer Neg Hx     Past Surgical History:  Procedure Laterality Date   ABDOMINAL HYSTERECTOMY     ABDOMINAL SURGERY     ovarian cyst removal   BALLOON DILATION N/A 02/27/2021   Procedure: BALLOON DILATION;  Surgeon: Eloise Harman, DO;  [facility];  Service: Endoscopy;  Laterality: N/A;   BIOPSY N/A 11/22/2013   Procedure: GASTRIC BIOPSY;  Surgeon: Daneil Dolin, MD;  Location: [facility];  Service: Endoscopy;  Laterality: N/A;   BIOPSY  02/27/2021   Procedure: BIOPSY;  Surgeon: Eloise Harman, DO;  [facility];  Service: Endoscopy;;   BLADDER SURGERY  suspension x4   x 4   CARPAL TUNNEL RELEASE Right 07/11/2015   Procedure: RIGHT CARPAL TUNNEL RELEASE;  Surgeon: Jessy Oto, MD;  Location: [facility];  Service: Orthopedics;  Laterality: Right;   CHOLECYSTECTOMY N/A 04/05/2014   Procedure: LAPAROSCOPIC CHOLECYSTECTOMY;  Surgeon: Aviva Signs Md, MD;  Location: [facility];  Service: General;  Laterality: N/A;   COCCYGECTOMY N/A 04/11/2015   Procedure: COCCYGECTOMY WITH OSTEOTOMY THROUGH AREA OF DEFORMITY;  Surgeon: Jessy Oto, MD;  Location: [facility];  Service: Orthopedics;  Laterality: N/A;   COLONOSCOPY     COLONOSCOPY WITH PROPOFOL N/A 11/22/2013   Dr. Gala Romney:  Grade 3 and 4/internal hemorrhoids?"likely source of hematochezia Normal colonoscopy otherwise.    COLONOSCOPY WITH PROPOFOL N/A 09/15/2017   Dr. Emerson Monte: Hemorrhoids, next colonoscopy 10 years   ESOPHAGOGASTRODUODENOSCOPY (EGD) WITH PROPOFOL N/A 11/22/2013   Dr. Gala Romney: Incomplete  Schatzki's ring dilated and disrupted as described above.  Small hiatal hernia. Focally abnormal gastric mucosa of uncertain significance status post biopsy, reactive gastropathy, negative H.pylori   ESOPHAGOGASTRODUODENOSCOPY (EGD) WITH PROPOFOL N/A 08/04/2015   Dr. Gala Romney: mild Schatzki's ring s/p dilation, small hiatal hernia    ESOPHAGOGASTRODUODENOSCOPY (EGD) WITH PROPOFOL N/A 03/17/2017   Mild Schatki's ring s/p dilation, small hiatal hernia, otherwise normal   ESOPHAGOGASTRODUODENOSCOPY (EGD) WITH PROPOFOL N/A 09/18/2018   Dr. Gala Romney: Schatzki ring status post dilation and disruption.  Hiatal hernia.   ESOPHAGOGASTRODUODENOSCOPY (EGD) WITH PROPOFOL N/A 12/20/2019   non-obstructing Schatzki's ring s/p dilation with 56, 58, and 60 F.   ESOPHAGOGASTRODUODENOSCOPY (EGD) WITH PROPOFOL N/A 02/27/2021   Procedure: ESOPHAGOGASTRODUODENOSCOPY (EGD) WITH PROPOFOL;  Surgeon: Eloise Harman, DO;  [facility];  Service: Endoscopy;  Laterality: N/A;  11:30am   EXCISION VAGINAL CYST Left 06/20/2012   Procedure: EXCISION LEFT LABIAL CYST;  Surgeon: Jonnie Kind, MD;  [facility];  Service: Gynecology;  Laterality: Left;   LOBECTOMY Left    LUNG CANCER SURGERY  1997   age 46, resection only   MALONEY DILATION N/A 11/22/2013   Procedure: MALONEY DILATION;  Surgeon: Daneil Dolin, MD;  [facility];  Service: Endoscopy;  Laterality: N/A;  Joseph N/A 08/04/2015   Procedure: [name] DILATION;  Surgeon: Daneil Dolin, MD;  [facility];  Service: Endoscopy;  Laterality: N/A;   MALONEY DILATION N/A 03/17/2017   Procedure: [name] DILATION;  Surgeon: Daneil Dolin, MD;  [facility];  Service: Endoscopy;  Laterality: N/A;   MALONEY DILATION N/A 09/18/2018   Procedure: [name] DILATION;  Surgeon: Daneil Dolin, MD;  [facility];  Service: Endoscopy;  Laterality: N/A;   MALONEY DILATION N/A 12/20/2019   Procedure: [name] DILATION;   Surgeon: Daneil Dolin, MD;  [facility];  Service: Endoscopy;  Laterality: N/A;   MASS EXCISION N/A 07/23/2014   Procedure: EXCISIONAL BIOPSY NODULE LEFT PARACOCCYGEAL AREA;  Surgeon: Jessy Oto, MD;  Location: [facility];  Service: Orthopedics;  Laterality: N/A;   MASS EXCISION Left 10/10/2015   Procedure: EXCISIONAL BIOPSY OF LEFT DORSORADIAL FOREARM MASS LIPOMA;  Surgeon: Jessy Oto, MD;  Location: [facility];  Service: Orthopedics;  Laterality: Left;   TUBAL LIGATION     Social History   Occupational History   Not on file  Tobacco Use   Smoking status: Former    Packs/day: 1.50    Years: 25.00    Total pack years: 37.50    Types: Cigarettes    Quit date: 04/07/1995    Years since quitting: 26.9   Smokeless tobacco: Never  Vaping Use   Vaping Use: Never used  Substance and Sexual Activity   Alcohol use: No   Drug use: No   Sexual activity: Not Currently    Birth control/protection: Surgical

## 2022-03-16 NOTE — Addendum Note (Signed)
Addended byYong Channel, Leita Lindbloom L on: 03/16/2022 10:08 AM   Modules accepted: Orders

## 2022-03-17 ENCOUNTER — Other Ambulatory Visit: Payer: Self-pay | Admitting: Family Medicine

## 2022-03-17 DIAGNOSIS — E7800 Pure hypercholesterolemia, unspecified: Secondary | ICD-10-CM

## 2022-03-22 ENCOUNTER — Telehealth (INDEPENDENT_AMBULATORY_CARE_PROVIDER_SITE_OTHER): Payer: Medicare PPO | Admitting: Family Medicine

## 2022-03-22 DIAGNOSIS — F339 Major depressive disorder, recurrent, unspecified: Secondary | ICD-10-CM
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
## 2022-03-25 ENCOUNTER — Ambulatory Visit (INDEPENDENT_AMBULATORY_CARE_PROVIDER_SITE_OTHER): Payer: Medicare PPO | Admitting: Nurse Practitioner

## 2022-03-25 ENCOUNTER — Other Ambulatory Visit
Admission: RE | Admit: 2022-03-25 | Discharge: 2022-03-25 | Disposition: A | Discharge status: Home / Self Care | Payer: Medicare PPO | Source: Ambulatory Visit | Attending: Nurse Practitioner | Admitting: Nurse Practitioner

## 2022-03-25 ENCOUNTER — Encounter: Payer: Self-pay | Admitting: Nurse Practitioner

## 2022-03-25 VITALS — BP 144/68 | HR 83 | Ht 64.0 in | Wt 174.0 lb

## 2022-03-25 DIAGNOSIS — I10 Essential (primary) hypertension: Secondary | ICD-10-CM | POA: Diagnosis not present

## 2022-03-25 DIAGNOSIS — Z87898 Personal history of other specified conditions: Secondary | ICD-10-CM

## 2022-03-25 DIAGNOSIS — I351 Nonrheumatic aortic (valve) insufficiency: Secondary | ICD-10-CM | POA: Diagnosis not present

## 2022-03-25 DIAGNOSIS — R072 Precordial pain: Secondary | ICD-10-CM | POA: Diagnosis not present

## 2022-03-25 DIAGNOSIS — R002 Palpitations: Secondary | ICD-10-CM

## 2022-03-25 DIAGNOSIS — J449 Chronic obstructive pulmonary disease, unspecified: Secondary | ICD-10-CM | POA: Diagnosis not present

## 2022-03-25 DIAGNOSIS — E782 Mixed hyperlipidemia: Secondary | ICD-10-CM

## 2022-03-25 LAB — BASIC METABOLIC PANEL
Anion gap: 8 (ref 5–15)
BUN: 6 mg/dL (ref 6–20)
CO2: 27 mmol/L (ref 22–32)
Calcium: 8.5 mg/dL — ABNORMAL LOW (ref 8.9–10.3)
Chloride: 102 mmol/L (ref 98–111)
Creatinine, Ser: 0.8 mg/dL (ref 0.44–1.00)
GFR, Estimated: >60 mL/min (ref >60)
Glucose, Bld: 97 mg/dL (ref 70–99)
Potassium: 4.1 mmol/L (ref 3.5–5.1)
Sodium: 137 mmol/L (ref 135–145)

## 2022-03-25 MED ORDER — FUROSEMIDE 20 MG PO TABS: 20.0000 mg | ORAL_TABLET | Freq: Every day | ORAL | 3 refills | Status: DC | PRN

## 2022-03-25 MED ORDER — DILTIAZEM HCL ER COATED BEADS 120 MG PO CP24: 120.0000 mg | ORAL_CAPSULE | Freq: Every day | ORAL | 3 refills | Status: DC

## 2022-03-25 NOTE — Patient Instructions (Signed)
Medication Instructions:  Your physician has recommended you make the following change in your medication:   Take Lasix 20 mg daily as needed.  Start Diltiazem 120 mg Daily   *If you need a refill on your cardiac medications before your next appointment, please call your pharmacy*   Lab Work: Your physician recommends that you return for lab work today.   If you have labs (blood work) drawn today and your tests are completely normal, you will receive your results only by: [place] (if you have MyChart) OR A paper copy in the mail If you have any lab test that is abnormal or we need to change your treatment, we will call you to review the results.   Testing/Procedures: Your physician has requested that you have an echocardiogram. Echocardiography is a painless test that uses sound waves to create images of your heart. It provides your doctor with information about the size and shape of your heart and how well your heart's chambers and valves are working. This procedure takes approximately one hour. There are no restrictions for this procedure. Please do NOT wear cologne, perfume, aftershave, or lotions (deodorant is allowed). Please arrive 15 minutes prior to your appointment time.    Follow-Up: At [facility], you and your health needs are our priority.  As part of our continuing mission to provide you with exceptional heart care, we have created designated Provider Care Teams.  These Care Teams include your primary Cardiologist (physician) and Advanced Practice Providers (APPs -  Physician Assistants and Nurse Practitioners) who all work together to provide you with the care you need, when you need it.  We recommend signing up for the patient portal called "MyChart".  Sign up information is provided on this After Visit Summary.  MyChart is used to connect with patients for Virtual Visits (Telemedicine).  Patients are able to view lab/test results, encounter notes,  upcoming appointments, etc.  Non-urgent messages can be sent to your provider as well.   To learn more about what you can do with MyChart, go to [email or web address].    Your next appointment:   1 month(s)  Provider:   Bernerd Pho, PA-C    Other Instructions Thank you for choosing [facility]!

## 2022-03-25 NOTE — Progress Notes (Signed)
Office Visit    Patient Name: [name] Date of Encounter: 03/25/2022  Primary Care Provider:  Shary Key, DO Primary Cardiologist:  Carlyle Dolly, MD  Chief Complaint    62 year old female with a history of moderate aortic insufficiency, palpitations, diastolic dysfunction, syncope, and hypertension, who presents for follow-up related to chest pain and palpitations.  Past Medical History    Past Medical History:  Diagnosis Date   Anxiety    Aortic insufficiency    a. 10/2017 Echo: Mod AI; b. 04/2020 Echo: Mod AI.   Asthma    every now and then.  Wheezing and coughing   Cancer ([place]) 1997   left lung   Chronic abdominal pain    Chronic back pain    Chronic joint pain    Depression    Diastolic dysfunction    a. 10/2017 Echo: EF 60-65%, GrII DD, mod AI; b. 04/2020 Echo: EF 60-65%, no rwma, GrI DD, nl RV fxn, mod AI.   Essential hypertension    HLD (hyperlipidemia)    Palpitations 01/2013   a. 11/2017 Holter: Occas PACs, rare PVCs. Rare nonsustained atrial tachycardia; b. 01/2018 Event monitor: Predominantly sinus rhythm @ 90 (64-135). No significant arrhythmias.   Shortness of breath dyspnea    with exertion   Past Surgical History:  Procedure Laterality Date   ABDOMINAL HYSTERECTOMY     ABDOMINAL SURGERY     ovarian cyst removal   BALLOON DILATION N/A 02/27/2021   Procedure: BALLOON DILATION;  Surgeon: Eloise Harman, DO;  [facility];  Service: Endoscopy;  Laterality: N/A;   BIOPSY N/A 11/22/2013   Procedure: GASTRIC BIOPSY;  Surgeon: Daneil Dolin, MD;  Location[facility];  Service: Endoscopy;  Laterality: N/A;   BIOPSY  02/27/2021   Procedure: BIOPSY;  Surgeon: Eloise Harman, DO;  [facility];  Service: Endoscopy;;   BLADDER SURGERY  suspension x4   x 4   CARPAL TUNNEL RELEASE Right 07/11/2015   Procedure: RIGHT CARPAL TUNNEL RELEASE;  Surgeon: Jessy Oto, MD;  Location: [place];  Service: Orthopedics;   Laterality: Right;   CHOLECYSTECTOMY N/A 04/05/2014   Procedure: LAPAROSCOPIC CHOLECYSTECTOMY;  Surgeon: Aviva Signs Md, MD;  [facility];  Service: General;  Laterality: N/A;   COCCYGECTOMY N/A 04/11/2015   Procedure: COCCYGECTOMY WITH OSTEOTOMY THROUGH AREA OF DEFORMITY;  Surgeon: Jessy Oto, MD;  Location: [place];  Service: Orthopedics;  Laterality: N/A;   COLONOSCOPY     COLONOSCOPY WITH PROPOFOL N/A 11/22/2013   Dr. Gala Romney: Grade 3 and 4/internal hemorrhoids?"likely source of hematochezia Normal colonoscopy otherwise.    COLONOSCOPY WITH PROPOFOL N/A 09/15/2017   Dr. Emerson Monte: Hemorrhoids, next colonoscopy 10 years   ESOPHAGOGASTRODUODENOSCOPY (EGD) WITH PROPOFOL N/A 11/22/2013   Dr. Gala Romney: Incomplete Schatzki's ring dilated and disrupted as described above.  Small hiatal hernia. Focally abnormal gastric mucosa of uncertain significance status post biopsy, reactive gastropathy, negative H.pylori   ESOPHAGOGASTRODUODENOSCOPY (EGD) WITH PROPOFOL N/A 08/04/2015   Dr. Gala Romney: mild Schatzki's ring s/p dilation, small hiatal hernia    ESOPHAGOGASTRODUODENOSCOPY (EGD) WITH PROPOFOL N/A 03/17/2017   Mild Schatki's ring s/p dilation, small hiatal hernia, otherwise normal   ESOPHAGOGASTRODUODENOSCOPY (EGD) WITH PROPOFOL N/A 09/18/2018   Dr. Gala Romney: Schatzki ring status post dilation and disruption.  Hiatal hernia.   ESOPHAGOGASTRODUODENOSCOPY (EGD) WITH PROPOFOL N/A 12/20/2019   non-obstructing Schatzki's ring s/p dilation with 56, 58, and 60 F.   ESOPHAGOGASTRODUODENOSCOPY (EGD) WITH PROPOFOL N/A  02/27/2021   Procedure: ESOPHAGOGASTRODUODENOSCOPY (EGD) WITH PROPOFOL;  Surgeon: Eloise Harman, DO;  [facility];  Service: Endoscopy;  Laterality: N/A;  11:30am   EXCISION VAGINAL CYST Left 06/20/2012   Procedure: EXCISION LEFT LABIAL CYST;  Surgeon: Jonnie Kind, MD;  [facility];  Service: Gynecology;  Laterality: Left;   LOBECTOMY Left    LUNG CANCER SURGERY  1997   age  89, resection only   MALONEY DILATION N/A 11/22/2013   Procedure: MALONEY DILATION;  Surgeon: Daneil Dolin, MD;  [facility];  Service: Endoscopy;  Laterality: N/A;  [place] N/A 08/04/2015   Procedure: [name] DILATION;  Surgeon: Daneil Dolin, MD;  [facility];  Service: Endoscopy;  Laterality: N/A;   MALONEY DILATION N/A 03/17/2017   Procedure: [name] DILATION;  Surgeon: Daneil Dolin, MD;  [facility];  Service: Endoscopy;  Laterality: N/A;   MALONEY DILATION N/A 09/18/2018   Procedure: [name] DILATION;  Surgeon: Daneil Dolin, MD;  [facility];  Service: Endoscopy;  Laterality: N/A;   MALONEY DILATION N/A 12/20/2019   Procedure: [name] DILATION;  Surgeon: Daneil Dolin, MD;  [facility];  Service: Endoscopy;  Laterality: N/A;   MASS EXCISION N/A 07/23/2014   Procedure: EXCISIONAL BIOPSY NODULE LEFT PARACOCCYGEAL AREA;  Surgeon: Jessy Oto, MD;  Location: [place];  Service: Orthopedics;  Laterality: N/A;   MASS EXCISION Left 10/10/2015   Procedure: EXCISIONAL BIOPSY OF LEFT DORSORADIAL FOREARM MASS LIPOMA;  Surgeon: Jessy Oto, MD;  Location: [place];  Service: Orthopedics;  Laterality: Left;   TUBAL LIGATION      Allergies  Allergies  Allergen Reactions   Promethazine Hcl Nausea And Vomiting and Other (See Comments)    "Makes my head hurt really bad too"    History of Present Illness    62 year old female with above past medical history including moderate aortic insufficiency, palpitations, diastolic dysfunction, syncope, and hypertension.  She previously underwent evaluation for syncope in late 2019 with echocardiogram showing normal LV function.  Holter monitoring showed short runs of atrial tachycardia but no sustained arrhythmias.  This was followed by a 30-day event monitor in early 2020 showing predominantly sinus rhythm at an average rate of 90 bpm without significant  arrhythmias.  Most recent echocardiogram in April 2022 showed an EF of 60 to 65% with grade 1 diastolic dysfunction, and stable, moderate aortic insufficiency.  Ms. Wiltse was last seen in cardiology clinic in January 2023, at which time she was doing well.  She notes that over the past year, she has had a lot of life stress.  About once every other month or so, she notes an episode of palpitations, which caused a lot of anxiety for her.  Symptoms occur randomly and typically only last a few seconds, prior to resolving spontaneously.  She has also been experiencing intermittent rest and exertional chest pressure.  This also occurs about once a month, is unrelated to palpitations, might be associated with dyspnea, and resolves within a minute, either with deep breathing or rest.  No predictable pattern to her symptoms.  She has chronic dyspnea on exertion in the setting of COPD, which overall has been stable.  She notes that she stopped taking amlodipine several months ago stating she was just tired of taking it.  She does not think she had any ill effects related to taking it.  She has been taking  Lasix 20 mg daily to manage her blood pressure, but feels that it makes her urinate too frequently.  This was previously prescribed as needed for lower extremity edema.  She denies PND, orthopnea, dizziness, syncope, or early satiety.  Home Medications    Current Outpatient Medications  Medication Sig Dispense Refill   acyclovir (ZOVIRAX) 400 MG tablet Take 1 tablet by mouth twice daily 180 tablet 0   albuterol (PROVENTIL) (2.5 MG/3ML) 0.083% nebulizer solution USE 1 VIAL IN NEBULIZER EVERY 6 HOURS AS NEEDED FOR WHEEZING AND FOR SHORTNESS OF BREATH 75 mL 0   amitriptyline (ELAVIL) 75 MG tablet Take 1 tablet (75 mg total) by mouth at bedtime. 90 tablet 0   atorvastatin (LIPITOR) 40 MG tablet Take 1 tablet (40 mg total) by mouth daily. [facility] check March 2024. 90 tablet 0    budesonide-formoterol (SYMBICORT) 160-4.5 MCG/ACT inhaler Inhale 2 puffs into the lungs 2 (two) times daily. 1 each 3   buPROPion (WELLBUTRIN XL) 150 MG 24 hr tablet Take 1 tablet (150 mg total) by mouth daily. 30 tablet 0   Capsaicin 0.035 % CREA Use on foot as needed for tingling 42.5 g 0   celecoxib (CELEBREX) 200 MG capsule Take 1 capsule by mouth once daily 60 capsule 0   diclofenac Sodium (VOLTAREN) 1 % GEL Apply 2 g topically 4 (four) times daily. 350 g 3   diltiazem (CARDIZEM CD) 120 MG 24 hr capsule Take 1 capsule (120 mg total) by mouth daily. 90 capsule 3   DULoxetine (CYMBALTA) 60 MG capsule Take 1 capsule by mouth twice daily 120 capsule 0   esomeprazole (NEXIUM) 40 MG capsule TAKE 1 CAPSULE BY MOUTH ONCE DAILY AT NOON 30 capsule 5   furosemide (LASIX) 20 MG tablet Take 1 tablet (20 mg total) by mouth daily as needed for edema. 90 tablet 3   LINZESS 290 MCG CAPS capsule TAKE 1 CAPSULE BY MOUTH ONCE DAILY BEFORE BREAKFAST 30 capsule 0   pregabalin (LYRICA) 100 MG capsule TAKE 1 CAPSULE BY MOUTH THREE TIMES DAILY 90 capsule 0   terbinafine (LAMISIL) 1 % cream Apply 1 application. topically 2 (two) times daily. 30 g 0   No current facility-administered medications for this visit.     Review of Systems    Palpitations associated with anxiety and intermittent chest pain as outlined above.  She has chronic, stable dyspnea on exertion.  Frequent urination and nocturia since taking Lasix daily.  Occasional lower extremity edema for which she uses Lasix.  She denies PND, orthopnea, dizziness, syncope, or early satiety.  All other systems reviewed and are otherwise negative except as noted above.    Physical Exam    VS:  BP (!) 144/68   Pulse 83   Ht '5\' 4"'$  (1.626 m)   Wt 174 lb (78.9 kg)   SpO2 95%   BMI 29.87 kg/m  , BMI Body mass index is 29.87 kg/m.     Vitals:   03/25/22 1359 03/25/22 1435  BP: (!) 142/70 (!) 144/68  Pulse: 83   SpO2: 95%     GEN: Well nourished, well  developed, in no acute distress. HEENT: normal. Neck: Supple, no JVD, carotid bruits, or masses. Cardiac: RRR, no murmurs, rubs, or gallops. No clubbing, cyanosis, edema.  Radials 2+/PT 2+ and equal bilaterally.  Respiratory:  Respirations regular and unlabored, inspiratory and expiratory wheezing throughout. GI: Soft, nontender, nondistended, BS + x 4. MS: no deformity or atrophy.  Skin: warm and dry, no rash. Neuro:  Strength and sensation are intact. Psych: Normal affect.  Accessory Clinical Findings    ECG personally reviewed by me today -regular sinus rhythm, 76 - no acute changes.  Lab Results  Component Value Date   WBC 4.3 03/16/2021   HGB 13.3 03/16/2021   HCT 38.9 03/16/2021   MCV 92.0 03/16/2021   PLT 287 03/16/2021   Lab Results  Component Value Date   CREATININE 0.85 03/16/2021   BUN 8 03/16/2021   NA 141 03/16/2021   K 3.7 03/16/2021   CL 104 03/16/2021   CO2 30 03/16/2021   Lab Results  Component Value Date   ALT 9 03/16/2021   AST 10 03/16/2021   ALKPHOS 199 (H) 11/11/2020   BILITOT 0.5 03/16/2021   Lab Results  Component Value Date   CHOL 226 (H) 02/15/2013   HDL 34 (L) 02/15/2013   LDLCALC 154 (H) 02/15/2013   TRIG 189 (H) 02/15/2013   CHOLHDL 6.6 02/15/2013    Lab Results  Component Value Date   HGBA1C 5.9 05/18/2021    Assessment & Plan    1.  Precordial chest pain/pressure: Over the past year, patient has been experiencing intermittent precordial chest pressure and heaviness occurring about once every other month, at rest or with exertion, sometimes associated with dyspnea, lasting generally less than 1 minute, and resolving either spontaneously or with rest.  She notes that the symptoms are separate from what she experiences when she has fluttering or palpitations (see below).  She has chronic, stable dyspnea on exertion in the setting of COPD.  She is due for a repeat echocardiogram in the setting of moderate aortic insufficiency.  I will  arrange for that and provided that EF and AI are stable, we will plan on a Myoview versus coronary CT angiogram to rule out ischemia.  If EF down or AI significantly worse (no significant murmur on exam) likely to need cath.  2.  Palpitations/symptomatic PACs/PSVT: Patient with symptomatic palpitations and fluttering occurring about once every other month, lasting few seconds, resolving spontaneously.  As she is no longer taking amlodipine (she discontinued on her own), I will add diltiazem CD 120 mg daily which will hopefully address both her palpitations and her hypertension.  We discussed event monitoring but given relative lack of symptoms, monitoring is likely to be low yield.  ECG shows sinus rhythm today.  3.  Essential hypertension: Blood pressure elevated on 2 consecutive checks today-142/70 and 144/68.  She previously was on amlodipine but discontinued this on her own.  She is currently only taking Lasix 20 mg daily and prefers not to have to take this, she does have frequent urination and nocturia.  I have asked her to drop her Lasix back to 20 mg once a day as needed for lower extremity edema only.  Given more frequent palpitations, adding diltiazem CD1 120 mg daily which will hopefully address both palpitations and hypertension.  4.  Moderate aortic insufficiency: Due for follow-up echo.  Will arrange.  5.  History of syncope: No recurrence.  Prior monitoring unremarkable.  6.  COPD: Actively wheezing today.  She has not smoked in many years.  Denies dyspnea at this time.  Encouraged her to use her inhalers as prescribed.  7.  Hyperlipidemia: She is on atorvastatin therapy with plan for follow-up lipids in March through primary care.  8.  Disposition: Follow-up basic metabolic panel given frequent urination and Lasix use.  Follow-up 2D echocardiogram  with plan for ischemic evaluation pending echo results.  Follow-up in clinic in approximately 1 month or sooner if necessary.  Murray Hodgkins, NP 03/25/2022, 2:50 PM

## 2022-03-26 ENCOUNTER — Ambulatory Visit (INDEPENDENT_AMBULATORY_CARE_PROVIDER_SITE_OTHER): Payer: Medicare PPO | Admitting: Physical Medicine and Rehabilitation

## 2022-03-26 ENCOUNTER — Other Ambulatory Visit: Payer: Self-pay | Admitting: Family Medicine

## 2022-03-26 DIAGNOSIS — M67432 Ganglion, left wrist: Secondary | ICD-10-CM

## 2022-03-26 DIAGNOSIS — R202 Paresthesia of skin: Secondary | ICD-10-CM

## 2022-03-26 DIAGNOSIS — M79642 Pain in left hand: Secondary | ICD-10-CM | POA: Diagnosis not present

## 2022-03-26 NOTE — Progress Notes (Signed)
  Functional Pain Scale - descriptive words and definitions  Moderate (4)   Constantly aware of pain, can complete ADLs with modification/sleep marginally affected at times/passive distraction is of no use, but active distraction gives some relief. Moderate range order  Average Pain 3   Left wrist cyst causing pain

## 2022-03-29 NOTE — Procedures (Signed)
EMG & NCV Findings: Evaluation of the left median (across palm) sensory nerve showed prolonged distal peak latency (Wrist, 4.0 ms) and prolonged distal peak latency (Palm, 2.1 ms).  All remaining nerves (as indicated in the following tables) were within normal limits.    All examined muscles (as indicated in the following table) showed no evidence of electrical instability.    Impression: The above electrodiagnostic study is ABNORMAL and reveals evidence of a mild left median nerve entrapment at the wrist (carpal tunnel syndrome) affecting sensory components.   There is no significant electrodiagnostic evidence of any other focal nerve entrapment, brachial plexopathy or cervical radiculopathy  Recommendations: 1.  Follow-up with referring physician. 2.  Continue current management of symptoms. 3.  Continue use of resting splint at night-time and as needed during the day.  ___________________________ [employer] Board Certified, American Board of Physical Medicine and Rehabilitation    Nerve Conduction Studies Anti Sensory Summary Table   Stim Site NR Peak (ms) Norm Peak (ms) P-T Amp (V) Norm P-T Amp Site1 Site2 Delta-P (ms) Dist (cm) Vel (m/s) Norm Vel (m/s)  Left Median Acr Palm Anti Sensory (2nd Digit)  30.8C  Wrist    *4.0 <3.6 19.5 >10 Wrist Palm 1.9 0.0    Palm    *2.1 <2.0 16.6         Left Radial Anti Sensory (Base 1st Digit)  31.3C  Wrist    2.3 <3.1 20.8  Wrist Base 1st Digit 2.3 0.0    Left Ulnar Anti Sensory (5th Digit)  31.4C  Wrist    3.1 <3.7 19.9 >15.0 Wrist 5th Digit 3.1 14.0 45 >38   Motor Summary Table   Stim Site NR Onset (ms) Norm Onset (ms) O-P Amp (mV) Norm O-P Amp Site1 Site2 Delta-0 (ms) Dist (cm) Vel (m/s) Norm Vel (m/s)  Left Median Motor (Abd Poll Brev)  31.6C  Wrist    3.6 <4.2 7.8 >5 Elbow Wrist 4.8 24.0 50 >50  Elbow    8.4  7.6         Left Ulnar Motor (Abd Dig Min)  31.7C  Wrist    2.7 <4.2 13.3 >3 B Elbow Wrist 4.0 25.0 63 >53  B  Elbow    6.7  12.7  A Elbow B Elbow 1.2 10.0 83 >53  A Elbow    7.9  11.9          EMG   Side Muscle Nerve Root Ins Act Fibs Psw Amp Dur Poly Recrt Int Fraser Din Comment  Left Abd Poll Brev Median C8-T1 Nml Nml Nml Nml Nml 0 Nml Nml   Left 1stDorInt Ulnar C8-T1 Nml Nml Nml Nml Nml 0 Nml Nml   Left PronatorTeres Median C6-7 Nml Nml Nml Nml Nml 0 Nml Nml   Left Biceps Musculocut C5-6 Nml Nml Nml Nml Nml 0 Nml Nml   Left Deltoid Axillary C5-6 Nml Nml Nml Nml Nml 0 Nml Nml     Nerve Conduction Studies Anti Sensory Left/Right Comparison   Stim Site L Lat (ms) R Lat (ms) L-R Lat (ms) L Amp (V) R Amp (V) L-R Amp (%) Site1 Site2 L Vel (m/s) R Vel (m/s) L-R Vel (m/s)  Median Acr Palm Anti Sensory (2nd Digit)  30.8C  Wrist *4.0   19.5   Wrist Palm     Palm *2.1   16.6         Radial Anti Sensory (Base 1st Digit)  31.3C  Wrist 2.3   20.8  Wrist Base 1st Digit     Ulnar Anti Sensory (5th Digit)  31.4C  Wrist 3.1   19.9   Wrist 5th Digit 45     Motor Left/Right Comparison   Stim Site L Lat (ms) R Lat (ms) L-R Lat (ms) L Amp (mV) R Amp (mV) L-R Amp (%) Site1 Site2 L Vel (m/s) R Vel (m/s) L-R Vel (m/s)  Median Motor (Abd Poll Brev)  31.6C  Wrist 3.6   7.8   Elbow Wrist 50    Elbow 8.4   7.6         Ulnar Motor (Abd Dig Min)  31.7C  Wrist 2.7   13.3   B Elbow Wrist 63    B Elbow 6.7   12.7   A Elbow B Elbow 83    A Elbow 7.9   11.9            Waveforms:

## 2022-04-01 NOTE — Progress Notes (Signed)
GI Office Note    Referring Provider: Shary Key, DO Primary Care Physician:  Shary Key, DO Primary Gastroenterologist: [name].Rourk, MD  Date:  04/05/2022  ID:  [name], DOB October 09, 1960, MRN [id number]   Chief Complaint   Chief Complaint  Patient presents with   Dysphagia    Trouble swallowing   History of Present Illness  [name] is a 63 y.o. female with a history of anxiety, asthma, lung cancer, depression, HLD, GERD, chronic constipation, and chronic abdominal pain presenting today with request to schedule EGD.   Colonoscopy August 2019: -Grade 3 internal hemorrhoids -External hemorrhoids -Repeat colonoscopy in 10 years  Last EGD 02/27/2021 -mild Schatzki's ring s/p dilation and biopsy, gastritis s/p biopsy, normal duodenum.  Pathology revealing reactive gastropathy. Prior EGD Dec 2021 also with Schatzki ring and dilation.   Labs 03/16/2028 -CBC, CMP, lipase all normal except for mildly elevated glucose to 103.   Last office visit 05/13/21. GERD well-controlled previously on Dexilant.  Insurance would not cover.  Appeal denied.  She was started on pantoprazole 40 mg daily.  Continue to have upper abdominal discomfort, dysphagia, sour taste, and burning in her throat.  Open to trying other alternatives.  Noted to have tried famotidine and Nexium in the past.  Reporting dysphagia issues with food and liquids.  Sometimes feels feel like they are hanging in her esophagus.  Also noted choking episodes with her saliva does not occur with every meal but does report symptoms that food comes back into her esophagus at times stating she can feel in her nose.  Allergy symptoms also also present.  Recommended nondrowsy allergy medication to patient.  Trial Nexium 40 mg once daily.  Continue Linzess 290 mcg daily.  Add famotidine 10 mg as needed.  Dysphagia precautions reinforced.  Today: GERD - Having some epigastric pain, burning, and bitter taste. Happens even  without eating. When she tries to eat she has stomach pain. Taking Nexium once per day. Reports the Nexium helped somewhat. Not taking anything over the counter. No N/V on a regular basis.  Patient would like to have a repeat upper endoscopy.  Dysphagia - Has been having issues for months with swallowing. Having issues multiple times per week. Unable to distinguish If just solids or liquids or both. No coughing. Does occasionally have pain with swallowing and reports food feels like it is stuck mid throat and has to drink lots of water to get it to go down.   Constipation - Having good results with the Linzess.   Occasional shortness of breath and chest pain. Chest pain comes with being out of breath. Does have COPD.   Just lost her sister in the last week. Going to have consultation for surgery about her arm next week.   Takes lasix just as needed - usually once per week.   Current Outpatient Medications  Medication Sig Dispense Refill   acyclovir (ZOVIRAX) 400 MG tablet Take 1 tablet by mouth twice daily 180 tablet 0   albuterol (PROVENTIL) (2.5 MG/3ML) 0.083% nebulizer solution USE 1 VIAL IN NEBULIZER EVERY 6 HOURS AS NEEDED FOR WHEEZING AND FOR SHORTNESS OF BREATH 75 mL 0   amitriptyline (ELAVIL) 75 MG tablet Take 1 tablet (75 mg total) by mouth at bedtime. 90 tablet 0   atorvastatin (LIPITOR) 40 MG tablet Take 1 tablet (40 mg total) by mouth daily. [facility] 2024. 90 tablet 0   budesonide-formoterol (  SYMBICORT) 160-4.5 MCG/ACT inhaler Inhale 2 puffs into the lungs 2 (two) times daily. 1 each 3   buPROPion (WELLBUTRIN XL) 150 MG 24 hr tablet Take 1 tablet (150 mg total) by mouth daily. 30 tablet 0   Capsaicin 0.035 % CREA Use on foot as needed for tingling 42.5 g 0   celecoxib (CELEBREX) 200 MG capsule Take 1 capsule by mouth once daily 60 capsule 0   diclofenac Sodium (VOLTAREN) 1 % GEL Apply 2 g topically 4 (four) times daily. 350 g 3    diltiazem (CARDIZEM CD) 120 MG 24 hr capsule Take 1 capsule (120 mg total) by mouth daily. 90 capsule 3   DULoxetine (CYMBALTA) 60 MG capsule Take 1 capsule by mouth twice daily 120 capsule 0   esomeprazole (NEXIUM) 40 MG capsule TAKE 1 CAPSULE BY MOUTH ONCE DAILY AT NOON 30 capsule 5   furosemide (LASIX) 20 MG tablet Take 1 tablet (20 mg total) by mouth daily as needed for edema. 90 tablet 3   LINZESS 290 MCG CAPS capsule TAKE 1 CAPSULE BY MOUTH ONCE DAILY BEFORE BREAKFAST 30 capsule 0   pregabalin (LYRICA) 100 MG capsule TAKE 1 CAPSULE BY MOUTH THREE TIMES DAILY 90 capsule 0   terbinafine (LAMISIL) 1 % cream Apply 1 application. topically 2 (two) times daily. 30 g 0   No current facility-administered medications for this visit.    Past Medical History:  Diagnosis Date   Anxiety    Aortic insufficiency    a. 10/2017 Echo: Mod AI; b. 04/2020 Echo: Mod AI.   Asthma    every now and then.  Wheezing and coughing   Cancer ([place]) 1997   left lung   Chronic abdominal pain    Chronic back pain    Chronic joint pain    Depression    Diastolic dysfunction    a. 10/2017 Echo: EF 60-65%, GrII DD, mod AI; b. 04/2020 Echo: EF 60-65%, no rwma, GrI DD, nl RV fxn, mod AI.   Essential hypertension    HLD (hyperlipidemia)    Palpitations 01/2013   a. 11/2017 Holter: Occas PACs, rare PVCs. Rare nonsustained atrial tachycardia; b. 01/2018 Event monitor: Predominantly sinus rhythm @ 90 (64-135). No significant arrhythmias.   Shortness of breath dyspnea    with exertion    Past Surgical History:  Procedure Laterality Date   ABDOMINAL HYSTERECTOMY     ABDOMINAL SURGERY     ovarian cyst removal   BALLOON DILATION N/A 02/27/2021   Procedure: BALLOON DILATION;  Surgeon: Eloise Harman, DO;  [facility];  Service: Endoscopy;  Laterality: N/A;   BIOPSY N/A 11/22/2013   Procedure: GASTRIC BIOPSY;  Surgeon: Daneil Dolin, MD;  [facility]: [facility];  Service: Endoscopy;  Laterality: N/A;    BIOPSY  02/27/2021   Procedure: BIOPSY;  Surgeon: Eloise Harman, DO;  [facility];  Service: Endoscopy;;   BLADDER SURGERY  suspension x4   x 4   CARPAL TUNNEL RELEASE Right 07/11/2015   Procedure: RIGHT CARPAL TUNNEL RELEASE;  Surgeon: Jessy Oto, MD;  Location: [place];  Service: Orthopedics;  Laterality: Right;   CHOLECYSTECTOMY N/A 04/05/2014   Procedure: LAPAROSCOPIC CHOLECYSTECTOMY;  Surgeon: Aviva Signs Md, MD;  Location: [facility];  Service: General;  Laterality: N/A;   COCCYGECTOMY N/A 04/11/2015   Procedure: COCCYGECTOMY WITH OSTEOTOMY THROUGH AREA OF DEFORMITY;  Surgeon: Jessy Oto, MD;  Location: [place];  Service: Orthopedics;  Laterality: N/A;  COLONOSCOPY     COLONOSCOPY WITH PROPOFOL N/A 11/22/2013   Dr. Gala Romney: Grade 3 and 4/internal hemorrhoids?"likely source of hematochezia Normal colonoscopy otherwise.    COLONOSCOPY WITH PROPOFOL N/A 09/15/2017   Dr. Emerson Monte: Hemorrhoids, next colonoscopy 10 years   ESOPHAGOGASTRODUODENOSCOPY (EGD) WITH PROPOFOL N/A 11/22/2013   Dr. Gala Romney: Incomplete Schatzki's ring dilated and disrupted as described above.  Small hiatal hernia. Focally abnormal gastric mucosa of uncertain significance status post biopsy, reactive gastropathy, negative H.pylori   ESOPHAGOGASTRODUODENOSCOPY (EGD) WITH PROPOFOL N/A 08/04/2015   Dr. Gala Romney: mild Schatzki's ring s/p dilation, small hiatal hernia    ESOPHAGOGASTRODUODENOSCOPY (EGD) WITH PROPOFOL N/A 03/17/2017   Mild Schatki's ring s/p dilation, small hiatal hernia, otherwise normal   ESOPHAGOGASTRODUODENOSCOPY (EGD) WITH PROPOFOL N/A 09/18/2018   Dr. Gala Romney: Schatzki ring status post dilation and disruption.  Hiatal hernia.   ESOPHAGOGASTRODUODENOSCOPY (EGD) WITH PROPOFOL N/A 12/20/2019   non-obstructing Schatzki's ring s/p dilation with 56, 58, and 60 F.   ESOPHAGOGASTRODUODENOSCOPY (EGD) WITH PROPOFOL N/A 02/27/2021   Procedure: ESOPHAGOGASTRODUODENOSCOPY (EGD) WITH  PROPOFOL;  Surgeon: Eloise Harman, DO;  [facility];  Service: Endoscopy;  Laterality: N/A;  11:30am   EXCISION VAGINAL CYST Left 06/20/2012   Procedure: EXCISION LEFT LABIAL CYST;  Surgeon: Jonnie Kind, MD;  [facility];  Service: Gynecology;  Laterality: Left;   LOBECTOMY Left    LUNG CANCER SURGERY  1997   age 18, resection only   MALONEY DILATION N/A 11/22/2013   Procedure: MALONEY DILATION;  Surgeon: Daneil Dolin, MD;  [facility];  Service: Endoscopy;  Laterality: N/A;  [place] N/A 08/04/2015   Procedure: [name] DILATION;  Surgeon: Daneil Dolin, MD;  [facility];  Service: Endoscopy;  Laterality: N/A;   MALONEY DILATION N/A 03/17/2017   Procedure: [name] DILATION;  Surgeon: Daneil Dolin, MD;  [facility];  Service: Endoscopy;  Laterality: N/A;   MALONEY DILATION N/A 09/18/2018   Procedure: [name] DILATION;  Surgeon: Daneil Dolin, MD;  [facility];  Service: Endoscopy;  Laterality: N/A;   MALONEY DILATION N/A 12/20/2019   Procedure: [name] DILATION;  Surgeon: Daneil Dolin, MD;  [facility];  Service: Endoscopy;  Laterality: N/A;   MASS EXCISION N/A 07/23/2014   Procedure: EXCISIONAL BIOPSY NODULE LEFT PARACOCCYGEAL AREA;  Surgeon: Jessy Oto, MD;  Location: [place];  Service: Orthopedics;  Laterality: N/A;   MASS EXCISION Left 10/10/2015   Procedure: EXCISIONAL BIOPSY OF LEFT DORSORADIAL FOREARM MASS LIPOMA;  Surgeon: Jessy Oto, MD;  Location: [place];  Service: Orthopedics;  Laterality: Left;   TUBAL LIGATION      Family History  Problem Relation Age of Onset   Diabetes Other    Heart disease Other        has pace maker   Hypertension Mother    Kidney disease Mother    Hypertension Father    Kidney disease Father    Heart disease Father    Cancer Father        leukemia   Hypertension Sister    Hypertension Sister    Colon cancer Neg Hx      Allergies as of 04/05/2022 - Review Complete 04/05/2022  Allergen Reaction Noted   Promethazine hcl Nausea And Vomiting and Other (See Comments) 07/26/2008    Social History   Socioeconomic History   Marital status: Married    Spouse name: Not on file  Number of children: 3   Years of education: Not on file   Highest education level: Not on file  Occupational History   Not on file  Tobacco Use   Smoking status: Former    Packs/day: 1.50    Years: 25.00    Additional pack years: 0.00    Total pack years: 37.50    Types: Cigarettes    Quit date: 04/07/1995    Years since quitting: 27.0   Smokeless tobacco: Never  Vaping Use   Vaping Use: Never used  Substance and Sexual Activity   Alcohol use: No   Drug use: No   Sexual activity: Not Currently    Birth control/protection: Surgical  Other Topics Concern   Not on file  Social History Narrative   Not on file   Social Determinants of Health   Financial Resource Strain: Low Risk  (12/10/2017)   Overall Financial Resource Strain (CARDIA)    Difficulty of Paying Living Expenses: Not hard at all  Food Insecurity: No Food Insecurity (12/10/2017)   Hunger Vital Sign    Worried About Running Out of Food in the Last Year: Never true    Ran Out of Food in the Last Year: Never true  Transportation Needs: No Transportation Needs (01/25/2022)   PRAPARE - Hydrologist (Medical): No    Lack of Transportation (Non-Medical): No  Physical Activity: Inactive (12/10/2017)   Exercise Vital Sign    Days of Exercise per Week: 0 days    Minutes of Exercise per Session: 0 min  Stress: No Stress Concern Present (12/10/2017)   [place]    Feeling of Stress : Not at all  Social Connections: Moderately Integrated (12/10/2017)   Social Connection and Isolation Panel [NHANES]    Frequency of Communication with Friends and Family: More than three  times a week    Frequency of Social Gatherings with Friends and Family: Three times a week    Attends Religious Services: 1 to 4 times per year    Active Member of Clubs or Organizations: No    Attends Archivist Meetings: Never    Marital Status: Married     Review of Systems   Gen: Denies fever, chills, anorexia. Denies fatigue, weakness, weight loss.  CV: Denies chest pain, palpitations, syncope, peripheral edema, and claudication. Resp: Denies dyspnea at rest, cough, wheezing, coughing up blood, and pleurisy. GI: See HPI Derm: Denies rash, itching, dry skin Psych: Denies depression, anxiety, memory loss, confusion. No homicidal or suicidal ideation.  Heme: Denies bruising, bleeding, and enlarged lymph nodes.   Physical Exam   BP 131/64 (BP Location: Right Arm, Patient Position: Sitting, Cuff Size: Normal)   Pulse 83   Temp 97.6 F (36.4 C) (Temporal)   Ht 5\' 4"  (1.626 m)   Wt 172 lb 9.6 oz (78.3 kg)   BMI 29.63 kg/m   General:   Alert and oriented. No distress noted. Pleasant and cooperative.  Head:  Normocephalic and atraumatic. Eyes:  Conjuctiva clear without scleral icterus. Mouth:  Oral mucosa pink and moist. Good dentition. No lesions. Lungs: Expiratory wheezing bilaterally, clear but diminished in the bases. Heart:  S1, S2 present without murmurs appreciated.  Abdomen:  +BS, soft, non-distended. Mild TTP to epigastrium no rebound or guarding. No HSM or masses noted. Rectal: deferred Msk:  Symmetrical without gross deformities. Normal posture. Extremities:  Without edema. Neurologic:  Alert and  oriented x4 Psych:  Alert and cooperative. Normal mood and affect.   Assessment  [name] is a 62 y.o. female with a history of anxiety, asthma, lung cancer, depression, HLD, GERD, chronic constipation, and chronic abdominal pain presenting today with dysphagia, epigastric pain, and GERD.  GERD, epigastric pain, dysphagia: Has been having increased  epigastric pain, burning, and bitter taste.  With that without eating.  Has pains at times with eating.  Has been taking Nexium once daily which reports it has helped some since her last visit.  Not taking anything for breakthrough.  Denies any nausea or vomiting.  Has been having dysphagia issues for many months.  This occurs multiple times per week and occasionally does have pain with swallowing reports his food and medications like it gets stuck mid throat and has to drink lots of liquids to get it to go down.  She does have occasional shortness of breath and chest pain but this is related to her COPD.  Advised that we may need to perform BPE versus esophageal manometry if EGD unrevealing and continuing to have dysphagia.  Constipation: Well-controlled on Linzess 200 mcg daily.   She does report history of hemorrhoids as well and was curious about hemorrhoid banding versus surgery.  We discussed that banding is for grade 1 through 3 hemorrhoids and then if her internal hemorrhoids are unable to be reduced in size that she would not be a candidate for hemorrhoid banding.  Provided her pamphlet today.  PLAN   Proceed with upper endoscopy with dilation with propofol by Dr. Gala Romney in near future: the risks, benefits, and alternatives have been discussed with the patient in detail. The patient states understanding and desires to proceed. ASA 3 Increase Nexium to 40 mg BID.  GERD  diet/lifestyle modifications Continue Linzess 290 mcg daily Pamphlet for hemorrhoid banding.  May need further evaluation with BP or esophageal manometry post EGD if normal. Follow-up in 3 months.    Venetia Night, MSN, FNP-BC, AGACNP-BC [facility]

## 2022-04-05 ENCOUNTER — Ambulatory Visit (INDEPENDENT_AMBULATORY_CARE_PROVIDER_SITE_OTHER): Payer: Medicare PPO | Admitting: Gastroenterology

## 2022-04-05 ENCOUNTER — Encounter: Payer: Self-pay | Admitting: *Deleted

## 2022-04-05 ENCOUNTER — Encounter: Payer: Self-pay | Admitting: Gastroenterology

## 2022-04-05 VITALS — BP 131/64 | HR 83 | Temp 97.6°F | Ht 64.0 in | Wt 172.6 lb

## 2022-04-05 DIAGNOSIS — R1319 Other dysphagia: Secondary | ICD-10-CM | POA: Diagnosis not present

## 2022-04-05 DIAGNOSIS — K642 Third degree hemorrhoids: Secondary | ICD-10-CM

## 2022-04-05 DIAGNOSIS — K5900 Constipation, unspecified: Secondary | ICD-10-CM | POA: Diagnosis not present

## 2022-04-05 DIAGNOSIS — K219 Gastro-esophageal reflux disease without esophagitis: Secondary | ICD-10-CM | POA: Diagnosis not present

## 2022-04-05 DIAGNOSIS — R1013 Epigastric pain: Secondary | ICD-10-CM | POA: Diagnosis not present

## 2022-04-05 MED ORDER — LINACLOTIDE 290 MCG PO CAPS: ORAL_CAPSULE | ORAL | 11 refills | Status: DC

## 2022-04-05 MED ORDER — ESOMEPRAZOLE MAGNESIUM 40 MG PO CPDR: 40.0000 mg | DELAYED_RELEASE_CAPSULE | Freq: Two times a day (BID) | ORAL | 3 refills | Status: DC

## 2022-04-05 NOTE — Patient Instructions (Addendum)
We are scheduling you for an upper endoscopy with possible dilation with Dr. Gala Romney in the near future.  Please begin increasing your Nexium from 40 mg daily to 40 mg twice daily, 30 minutes prior to breakfast and dinner. Follow a GERD diet:  Avoid fried, fatty, greasy, spicy, citrus foods. Avoid caffeine and carbonated beverages. Avoid chocolate. Try eating 4-6 small meals a day rather than 3 large meals. Do not eat within 3 hours of laying down. Prop head of bed up on wood or bricks to create a 6 inch incline.  I am providing you with a handout regarding hemorrhoid banding.  We will plan to follow-up in 3 months, sooner if needed.  It was a pleasure to see you today. I want to create trusting relationships with patients. If you receive a survey regarding your visit,  I greatly appreciate you taking time to fill this out on paper or through your MyChart. I value your feedback.  Venetia Night, MSN, FNP-BC, AGACNP-BC [facility]

## 2022-04-06 NOTE — Progress Notes (Signed)
[name] - 62 y.o. female MRN [id number]  Date of birth: Jul 15, 1960  Office Visit Note: Visit Date: 03/26/2022 PCP: Shary Key, DO Referred by: Persons, Bevely Palmer, PA  Subjective: Chief Complaint  Patient presents with   Left Wrist - Pain    cyst   HPI: [name] is a 62 y.o. female who comes in today at the request of [place], PA-C for evaluation and management of chronic, worsening and severe pain, numbness and tingling in the Left upper extremities.  Patient is Right hand dominant.  She reports moderate constant pain with paresthesia in the left hand particularly wrist.  She feels like she has a cyst that is causing her left wrist pain and she is unsure if this is causing the numbness.  She also reports a mass on the medial elbow.  She feels like her hand is the biggest problem.  Her symptoms are somewhat nondermatomal.  She does feel like they are very similar to what she had on the right.  Mary and saw her and documented that she was a former patient of Dr. Louanne Skye and Dr. Lorin Mercy.  She has had prior right carpal tunnel release.  She has had prior electrodiagnostic study in our office on 2 occasions.  Those are reviewed below in detail but basically in 2016 had a normal study of the upper arm on the right and right lower limb.  However the next year she actually had mild to moderate median nerve entrapment and underwent carpal tunnel release that did help her quite a bit.  She denies any frank radicular symptoms but does have neck pain and generalized joint pain.  She has been followed by Dr. Estanislado Pandy in rheumatology.  She is not diabetic.  She has had a history of chronic pain syndrome.  She can have some nocturnal pain but really more pain with movement.  She rates her pain as a 4 out of 10.  Functional that she is able to complete activities of daily living but it is definitely a problem       Review of Systems  Musculoskeletal:  Positive for back pain and joint  pain.  Neurological:  Positive for tingling.  All other systems reviewed and are negative.  Otherwise per HPI.  Assessment & Plan: Visit Diagnoses:    ICD-10-CM   1. Paresthesia of skin  R20.2 NCV with EMG (electromyography)    2. Pain in left hand  M79.642     3. Ganglion cyst of dorsum of left wrist  M67.432        Plan: Impression: Clinically her symptoms are fairly vague and not really specific for carpal tunnel syndrome although she reports it is very similar to what she felt on the right side.  When we did the prior right studies back in 2017 it was interesting that there was worsening over 1 year time span.  No left-sided studies were done at the time.  Her symptoms then were vague as well but she did seem to get relief with carpal tunnel release.  She clearly has some pain from the cyst itself and some pain from arthritis particular the CMC joints.  The above electrodiagnostic study is ABNORMAL and reveals evidence of a mild left median nerve entrapment at the wrist (carpal tunnel syndrome) affecting sensory components.   There is no significant electrodiagnostic evidence of any other focal nerve entrapment, brachial plexopathy or cervical radiculopathy  Recommendations: 1.  Follow-up with referring physician.  2.  Continue current management of symptoms. 3.  Continue use of resting splint at night-time and as needed during the day.  Meds & Orders: No orders of the defined types were placed in this encounter.   Orders Placed This Encounter  Procedures   NCV with EMG (electromyography)    Follow-up: Return for [place], PA-C.   Procedures: No procedures performed  EMG & NCV Findings: Evaluation of the left median (across palm) sensory nerve showed prolonged distal peak latency (Wrist, 4.0 ms) and prolonged distal peak latency (Palm, 2.1 ms).  All remaining nerves (as indicated in the following tables) were within normal limits.    All examined muscles (as  indicated in the following table) showed no evidence of electrical instability.    Impression: The above electrodiagnostic study is ABNORMAL and reveals evidence of a mild left median nerve entrapment at the wrist (carpal tunnel syndrome) affecting sensory components.   There is no significant electrodiagnostic evidence of any other focal nerve entrapment, brachial plexopathy or cervical radiculopathy  Recommendations: 1.  Follow-up with referring physician. 2.  Continue current management of symptoms. 3.  Continue use of resting splint at night-time and as needed during the day.  ___________________________ Wonda Olds Board Certified, American Board of Physical Medicine and Rehabilitation    Nerve Conduction Studies Anti Sensory Summary Table   Stim Site NR Peak (ms) Norm Peak (ms) P-T Amp (V) Norm P-T Amp Site1 Site2 Delta-P (ms) Dist (cm) Vel (m/s) Norm Vel (m/s)  Left Median Acr Palm Anti Sensory (2nd Digit)  30.8C  Wrist    *4.0 <3.6 19.5 >10 Wrist Palm 1.9 0.0    Palm    *2.1 <2.0 16.6         Left Radial Anti Sensory (Base 1st Digit)  31.3C  Wrist    2.3 <3.1 20.8  Wrist Base 1st Digit 2.3 0.0    Left Ulnar Anti Sensory (5th Digit)  31.4C  Wrist    3.1 <3.7 19.9 >15.0 Wrist 5th Digit 3.1 14.0 45 >38   Motor Summary Table   Stim Site NR Onset (ms) Norm Onset (ms) O-P Amp (mV) Norm O-P Amp Site1 Site2 Delta-0 (ms) Dist (cm) Vel (m/s) Norm Vel (m/s)  Left Median Motor (Abd Poll Brev)  31.6C  Wrist    3.6 <4.2 7.8 >5 Elbow Wrist 4.8 24.0 50 >50  Elbow    8.4  7.6         Left Ulnar Motor (Abd Dig Min)  31.7C  Wrist    2.7 <4.2 13.3 >3 B Elbow Wrist 4.0 25.0 63 >53  B Elbow    6.7  12.7  A Elbow B Elbow 1.2 10.0 83 >53  A Elbow    7.9  11.9          EMG   Side Muscle Nerve Root Ins Act Fibs Psw Amp Dur Poly Recrt Int Fraser Din Comment  Left Abd Poll Brev Median C8-T1 Nml Nml Nml Nml Nml 0 Nml Nml   Left 1stDorInt Ulnar C8-T1 Nml Nml Nml Nml Nml 0 Nml Nml   Left  PronatorTeres Median C6-7 Nml Nml Nml Nml Nml 0 Nml Nml   Left Biceps Musculocut C5-6 Nml Nml Nml Nml Nml 0 Nml Nml   Left Deltoid Axillary C5-6 Nml Nml Nml Nml Nml 0 Nml Nml     Nerve Conduction Studies Anti Sensory Left/Right Comparison   Stim Site L Lat (ms) R Lat (ms) L-R Lat (ms) L Amp (  V) R Amp (V) L-R Amp (%) Site1 Site2 L Vel (m/s) R Vel (m/s) L-R Vel (m/s)  Median Acr Palm Anti Sensory (2nd Digit)  30.8C  Wrist *4.0   19.5   Wrist Palm     Palm *2.1   16.6         Radial Anti Sensory (Base 1st Digit)  31.3C  Wrist 2.3   20.8   Wrist Base 1st Digit     Ulnar Anti Sensory (5th Digit)  31.4C  Wrist 3.1   19.9   Wrist 5th Digit 45     Motor Left/Right Comparison   Stim Site L Lat (ms) R Lat (ms) L-R Lat (ms) L Amp (mV) R Amp (mV) L-R Amp (%) Site1 Site2 L Vel (m/s) R Vel (m/s) L-R Vel (m/s)  Median Motor (Abd Poll Brev)  31.6C  Wrist 3.6   7.8   Elbow Wrist 50    Elbow 8.4   7.6         Ulnar Motor (Abd Dig Min)  31.7C  Wrist 2.7   13.3   B Elbow Wrist 63    B Elbow 6.7   12.7   A Elbow B Elbow 83    A Elbow 7.9   11.9            Waveforms:             Clinical History: 06/06/2015 electrodiagnostic study of the right upper and lower limb  Impression:  Essentially ABNORMAL electrodiagnostic study and reveals evidence of a mild to moderate right median nerve entrapment at the wrist (carpal tunnel syndrome) affecting sensory and motor components.  There is no significant electrical diagnostic evidence of nerve entrapment, cervical radiculopathy, lumbar radiculopathy or generalized peripheral polyneuropathy.   As you know, purely sensory or demyelinating radiculopathy radiculitis may this particularly electrodiagnostic study.  Careful clinical correlation to her symptoms is paramount.  Einar Gip. Ernestina Patches, MD ---  08/09/2014 electrodiagnostic study of the right upper and lower limb  Impression:  Essentially NORMAL electrodiagnostic study of the right.  There is  no significant electrical diagnostic evidence of nerve entrapment, cervical radiculopathy, lumbar radiculopathy or generalized peripheral polyneuropathy.  As you know, purely sensory or demyelinating radiculopathy radiculitis may this particularly electrodiagnostic study.  Einar Gip. Ernestina Patches, MD   She reports that she quit smoking about 27 years ago. Her smoking use included cigarettes. She has a 37.50 pack-year smoking history. She has never used smokeless tobacco.  Recent Labs    05/18/21 0944  HGBA1C 5.9    Objective:  VS:  HT:    WT:   BMI:     BP:   HR: bpm  TEMP: ( )  RESP:  Physical Exam Vitals and nursing note reviewed.  Constitutional:      General: She is not in acute distress.    Appearance: Normal appearance. She is well-developed. She is not ill-appearing.  HENT:     Head: Normocephalic and atraumatic.  Eyes:     Conjunctiva/sclera: Conjunctivae normal.     Pupils: Pupils are equal, round, and reactive to light.  Cardiovascular:     Rate and Rhythm: Normal rate.     Pulses: Normal pulses.  Pulmonary:     Effort: Pulmonary effort is normal.  Musculoskeletal:        General: No swelling, tenderness or deformity.     Right lower leg: No edema.     Left lower leg: No edema.     Comments: Inspection  reveals well-healed carpal tunnel release scar on the right but no atrophy of the bilateral APB or FDI or hand intrinsics. There is no swelling, color changes, allodynia or dystrophic changes. There is 5 out of 5 strength in the bilateral wrist extension, finger abduction and long finger flexion. There is intact sensation to light touch in all dermatomal and peripheral nerve distributions. There is a negative Froment's test bilaterally. There is a negative Tinel's test at the bilateral wrist and elbow. There is a positive Phalen's test on the right. There is a negative Hoffmann's test bilaterally.  Skin:    General: Skin is warm and dry.     Findings: No erythema or rash.   Neurological:     General: No focal deficit present.     Mental Status: She is alert and oriented to person, place, and time.     Sensory: No sensory deficit.     Motor: No weakness or abnormal muscle tone.     Coordination: Coordination normal.     Gait: Gait normal.  Psychiatric:        Mood and Affect: Mood normal.        Behavior: Behavior normal.     Ortho Exam  Imaging: No results found.  Past Medical/Family/Surgical/Social History: Medications & Allergies reviewed per EMR, new medications updated. Patient Active Problem List   Diagnosis Date Noted   Cheilosis 05/21/2021   Burning sensation of toe and foot 05/21/2021   Abdominal pain, epigastric 123456   Lichen planus atrophicus 02/09/2021   Acute conjunctivitis of both eyes 03/25/2020   Radial tunnel syndrome 02/06/2019   Sacroiliac inflammation ([place]) 05/23/2018   Poor sleep hygiene 05/23/2018   Chronic obstructive pulmonary disease with (acute) exacerbation (HCC) 05/23/2018   Orthostatic hypotension 05/23/2018   Syncope and collapse 05/23/2018   Elevated fasting glucose 05/23/2018   Nocturia more than twice per night 05/23/2018   Snoring 05/23/2018   Hemorrhoid 02/15/2018   Abdominal distention 02/15/2018   Anemia 01/09/2018   Pain of upper abdomen 01/09/2018   Healthcare maintenance 12/30/2017   Antibiotic-associated diarrhea 12/22/2017   Vagina itching 12/22/2017   Sepsis ([place])    Respiratory distress    Partial small bowel obstruction ([place]) 12/11/2017   COPD with acute exacerbation ([place]) 12/11/2017   Hypophosphatemia 12/11/2017   Sepsis due to undetermined organism ([place]) 12/10/2017   AKI (acute kidney injury) ([place]) 12/10/2017   Hyperbilirubinemia 12/10/2017   Aortic regurgitation 123456   Diastolic dysfunction 123456   Dysphagia 01/20/2017   Left hip pain 01/19/2017   Right hand pain 01/19/2017   Diarrhea 10/11/2016   Right hip pain 06/07/2016   Dysuria 04/14/2016   Malaise and  fatigue 04/14/2016   Breast pain, left 11/06/2015   Mass of left forearm 10/10/2015    Class: Chronic   Dysphagia, oropharyngeal    Gastroesophageal reflux disease without esophagitis    Carpal tunnel syndrome, right 07/11/2015    Class: Chronic   Prediabetes 07/09/2015   Lumbar stenosis with neurogenic claudication 01/05/2015   Spondylolisthesis of lumbar region 01/03/2015    Class: Chronic   Soft tissue mass 07/23/2014    Class: Acute   Coccygodynia 07/23/2014    Class: Acute   Constipation 02/15/2014   Dysphagia, pharyngoesophageal phase    Elevated alkaline phosphatase level 11/06/2013   Rectal bleeding 11/06/2013   Obesity, unspecified 02/15/2013   Genital herpes 10/12/2012   HSV (herpes simplex virus) anogenital infection 09/29/2012   UNSPECIFIED SLEEP APNEA 12/04/2008   ADENOCARCINOMA, LUNG 10/09/2008  Osteoarthritis of multiple joints 07/13/2006   HYPERCHOLESTEROLEMIA 03/17/2006   Major depressive disorder, recurrent episode ([place]) 03/17/2006   HYPERTENSION, BENIGN SYSTEMIC 03/17/2006   GASTROESOPHAGEAL REFLUX, NO ESOPHAGITIS 03/17/2006   Insomnia due to medical condition 03/17/2006   Past Medical History:  Diagnosis Date   Anxiety    Aortic insufficiency    a. 10/2017 Echo: Mod AI; b. 04/2020 Echo: Mod AI.   Asthma    every now and then.  Wheezing and coughing   Cancer ([place]) 1997   left lung   Chronic abdominal pain    Chronic back pain    Chronic joint pain    Depression    Diastolic dysfunction    a. 10/2017 Echo: EF 60-65%, GrII DD, mod AI; b. 04/2020 Echo: EF 60-65%, no rwma, GrI DD, nl RV fxn, mod AI.   Essential hypertension    HLD (hyperlipidemia)    Palpitations 01/2013   a. 11/2017 Holter: Occas PACs, rare PVCs. Rare nonsustained atrial tachycardia; b. 01/2018 Event monitor: Predominantly sinus rhythm @ 90 (64-135). No significant arrhythmias.   Shortness of breath dyspnea    with exertion   Family History  Problem Relation Age of Onset    Diabetes Other    Heart disease Other        has pace maker   Hypertension Mother    Kidney disease Mother    Hypertension Father    Kidney disease Father    Heart disease Father    Cancer Father        leukemia   Hypertension Sister    Hypertension Sister    Colon cancer Neg Hx    Past Surgical History:  Procedure Laterality Date   ABDOMINAL HYSTERECTOMY     ABDOMINAL SURGERY     ovarian cyst removal   BALLOON DILATION N/A 02/27/2021   Procedure: BALLOON DILATION;  Surgeon: Eloise Harman, DO;  [facility];  Service: Endoscopy;  Laterality: N/A;   BIOPSY N/A 11/22/2013   Procedure: GASTRIC BIOPSY;  Surgeon: Daneil Dolin, MD;  Location: [facility];  Service: Endoscopy;  Laterality: N/A;   BIOPSY  02/27/2021   Procedure: BIOPSY;  Surgeon: Eloise Harman, DO;  [facility];  Service: Endoscopy;;   BLADDER SURGERY  suspension x4   x 4   CARPAL TUNNEL RELEASE Right 07/11/2015   Procedure: RIGHT CARPAL TUNNEL RELEASE;  Surgeon: Jessy Oto, MD;  Location: [facility];  Service: Orthopedics;  Laterality: Right;   CHOLECYSTECTOMY N/A 04/05/2014   Procedure: LAPAROSCOPIC CHOLECYSTECTOMY;  Surgeon: Aviva Signs Md, MD;  Location: [facility];  Service: General;  Laterality: N/A;   COCCYGECTOMY N/A 04/11/2015   Procedure: COCCYGECTOMY WITH OSTEOTOMY THROUGH AREA OF DEFORMITY;  Surgeon: Jessy Oto, MD;  Location: [facility];  Service: Orthopedics;  Laterality: N/A;   COLONOSCOPY     COLONOSCOPY WITH PROPOFOL N/A 11/22/2013   Dr. Gala Romney: Grade 3 and 4/internal hemorrhoids?"likely source of hematochezia Normal colonoscopy otherwise.    COLONOSCOPY WITH PROPOFOL N/A 09/15/2017   Dr. Emerson Monte: Hemorrhoids, next colonoscopy 10 years   ESOPHAGOGASTRODUODENOSCOPY (EGD) WITH PROPOFOL N/A 11/22/2013   Dr. Gala Romney: Incomplete Schatzki's ring dilated and disrupted as described above.  Small hiatal hernia. Focally abnormal gastric mucosa of uncertain significance status  post biopsy, reactive gastropathy, negative H.pylori   ESOPHAGOGASTRODUODENOSCOPY (EGD) WITH PROPOFOL N/A 08/04/2015   Dr. Gala Romney: mild Schatzki's ring s/p dilation, small hiatal hernia    ESOPHAGOGASTRODUODENOSCOPY (EGD) WITH PROPOFOL N/A 03/17/2017  Mild Schatki's ring s/p dilation, small hiatal hernia, otherwise normal   ESOPHAGOGASTRODUODENOSCOPY (EGD) WITH PROPOFOL N/A 09/18/2018   Dr. Gala Romney: Schatzki ring status post dilation and disruption.  Hiatal hernia.   ESOPHAGOGASTRODUODENOSCOPY (EGD) WITH PROPOFOL N/A 12/20/2019   non-obstructing Schatzki's ring s/p dilation with 56, 58, and 60 F.   ESOPHAGOGASTRODUODENOSCOPY (EGD) WITH PROPOFOL N/A 02/27/2021   Procedure: ESOPHAGOGASTRODUODENOSCOPY (EGD) WITH PROPOFOL;  Surgeon: Eloise Harman, DO;  [facility];  Service: Endoscopy;  Laterality: N/A;  11:30am   EXCISION VAGINAL CYST Left 06/20/2012   Procedure: EXCISION LEFT LABIAL CYST;  Surgeon: Jonnie Kind, MD;  [facility];  Service: Gynecology;  Laterality: Left;   LOBECTOMY Left    LUNG CANCER SURGERY  1997   age 84, resection only   MALONEY DILATION N/A 11/22/2013   Procedure: MALONEY DILATION;  Surgeon: Daneil Dolin, MD;  [facility];  Service: Endoscopy;  Laterality: N/A;  [place] N/A 08/04/2015   Procedure: [name] DILATION;  Surgeon: Daneil Dolin, MD;  [facility];  Service: Endoscopy;  Laterality: N/A;   MALONEY DILATION N/A 03/17/2017   Procedure: [name] DILATION;  Surgeon: Daneil Dolin, MD;  [facility];  Service: Endoscopy;  Laterality: N/A;   MALONEY DILATION N/A 09/18/2018   Procedure: [name] DILATION;  Surgeon: Daneil Dolin, MD;  [facility];  Service: Endoscopy;  Laterality: N/A;   MALONEY DILATION N/A 12/20/2019   Procedure: [name] DILATION;  Surgeon: Daneil Dolin, MD;  [facility];  Service: Endoscopy;  Laterality: N/A;   MASS EXCISION N/A 07/23/2014   Procedure:  EXCISIONAL BIOPSY NODULE LEFT PARACOCCYGEAL AREA;  Surgeon: Jessy Oto, MD;  Location: [place] [facility];  Service: Orthopedics;  Laterality: N/A;   MASS EXCISION Left 10/10/2015   Procedure: EXCISIONAL BIOPSY OF LEFT DORSORADIAL FOREARM MASS LIPOMA;  Surgeon: Jessy Oto, MD;  Location: [place];  Service: Orthopedics;  Laterality: Left;   TUBAL LIGATION     Social History   Occupational History   Not on file  Tobacco Use   Smoking status: Former    Packs/day: 1.50    Years: 25.00    Additional pack years: 0.00    Total pack years: 37.50    Types: Cigarettes    Quit date: 04/07/1995    Years since quitting: 27.0   Smokeless tobacco: Never  Vaping Use   Vaping Use: Never used  Substance and Sexual Activity   Alcohol use: No   Drug use: No   Sexual activity: Not Currently    Birth control/protection: Surgical

## 2022-04-07 ENCOUNTER — Telehealth: Payer: Self-pay | Admitting: *Deleted

## 2022-04-07 ENCOUNTER — Telehealth: Payer: Self-pay | Admitting: Family Medicine

## 2022-04-07 NOTE — Telephone Encounter (Signed)
Contacted [name] to schedule their annual wellness visit. Appointment made for 04/13/2022.  Thank you,  [place] Direct dial  [phone]

## 2022-04-07 NOTE — Telephone Encounter (Signed)
[place] PA: Approved Authorization [id number]  Tracking [id number] Dates of service 05/12/2022 - 07/30/2022

## 2022-04-12 ENCOUNTER — Other Ambulatory Visit: Payer: Self-pay | Admitting: Family Medicine

## 2022-04-12 NOTE — Patient Instructions (Signed)

## 2022-04-12 NOTE — Progress Notes (Unsigned)
I connected with  [name] on 04/13/2022 by a audio enabled telemedicine application and verified that I am speaking with the correct person using two identifiers.  Patient Location: Home  Provider Location: Home Office  I discussed the limitations of evaluation and management by telemedicine. The patient expressed understanding and agreed to proceed.   Subjective:   [name] is a 62 y.o. female who presents for an Initial Medicare Annual Wellness Visit.  Review of Systems    Per HPI unless specifically indicated below.            Objective:      04/05/2022    1:56 PM 03/25/2022    2:35 PM 03/25/2022    1:59 PM  Vitals with BMI  Height 5\' 4"   5\' 4"   Weight 172 lbs 10 oz  174 lbs  BMI [id number]  [id number]  Systolic [id number]  Diastolic 64 68 70  Pulse 83  83     Today's Vitals   04/13/22 0840  PainSc: 7    There is no height or weight on file to calculate BMI.     04/13/2022    8:55 AM 03/05/2022    8:33 AM 05/18/2021    9:09 AM 02/27/2021   10:22 AM 02/09/2021    8:37 AM 11/11/2020   11:09 AM 12/27/2019   10:53 AM  Advanced Directives  Does Patient Have a Medical Advance Directive? No No No No No No No  Would patient like information on creating a medical advance directive? No - Patient declined  No - Patient declined No - Patient declined No - Patient declined No - Patient declined No - Patient declined    Current Medications (verified) Outpatient Encounter Medications as of 04/13/2022  Medication Sig   acyclovir (ZOVIRAX) 400 MG tablet Take 1 tablet by mouth twice daily   albuterol (PROVENTIL) (2.5 MG/3ML) 0.083% nebulizer solution USE 1 VIAL IN NEBULIZER EVERY 6 HOURS AS NEEDED FOR WHEEZING AND FOR SHORTNESS OF BREATH   amitriptyline (ELAVIL) 75 MG tablet Take 1 tablet (75 mg total) by mouth at bedtime. (Patient taking differently: Take 150 mg by mouth at bedtime.)   atorvastatin (LIPITOR) 40 MG tablet Take 1 tablet (40 mg total) by mouth daily. [facility] for cholesterol check March 2024.   budesonide-formoterol (SYMBICORT) 160-4.5 MCG/ACT inhaler Inhale 2 puffs into the lungs 2 (two) times daily.   buPROPion (WELLBUTRIN XL) 150 MG 24 hr tablet Take 1 tablet (150 mg total) by mouth daily.   Capsaicin 0.035 % CREA Use on foot as needed for tingling   celecoxib (CELEBREX) 200 MG capsule Take 1 capsule by mouth once daily   diclofenac Sodium (VOLTAREN) 1 % GEL Apply 2 g topically 4 (four) times daily.   diltiazem (CARDIZEM CD) 120 MG 24 hr capsule Take 1 capsule (120 mg total) by mouth daily.   DULoxetine (CYMBALTA) 60 MG capsule Take 1 capsule by mouth twice daily   esomeprazole (NEXIUM) 40 MG capsule Take 1 capsule (40 mg total) by mouth 2 (two) times daily before a meal.   furosemide (LASIX) 20 MG tablet Take 1 tablet (20 mg total) by mouth daily as needed for edema.   linaclotide (LINZESS) 290 MCG CAPS capsule TAKE 1 CAPSULE BY MOUTH ONCE DAILY BEFORE BREAKFAST   pregabalin (LYRICA) 100 MG capsule TAKE 1 CAPSULE BY MOUTH THREE TIMES DAILY   terbinafine (LAMISIL) 1 % cream Apply 1 application. topically 2 (two) times  daily.   No facility-administered encounter medications on file as of 04/13/2022.    Allergies (verified) Promethazine hcl   History: Past Medical History:  Diagnosis Date   Anxiety    Aortic insufficiency    a. 10/2017 Echo: Mod AI; b. 04/2020 Echo: Mod AI.   Asthma    every now and then.  Wheezing and coughing   Cancer ([place]) 1997   left lung   Chronic abdominal pain    Chronic back pain    Chronic joint pain    Depression    Diastolic dysfunction    a. 10/2017 Echo: EF 60-65%, GrII DD, mod AI; b. 04/2020 Echo: EF 60-65%, no rwma, GrI DD, nl RV fxn, mod AI.   Essential hypertension    HLD (hyperlipidemia)    Palpitations 01/2013   a. 11/2017 Holter: Occas PACs, rare PVCs. Rare nonsustained atrial tachycardia; b. 01/2018 Event monitor: Predominantly sinus rhythm @ 90 (64-135). No significant  arrhythmias.   Shortness of breath dyspnea    with exertion   Past Surgical History:  Procedure Laterality Date   ABDOMINAL HYSTERECTOMY     ABDOMINAL SURGERY     ovarian cyst removal   BALLOON DILATION N/A 02/27/2021   Procedure: BALLOON DILATION;  Surgeon: Eloise Harman, DO;  [facility];  Service: Endoscopy;  Laterality: N/A;   BIOPSY N/A 11/22/2013   Procedure: GASTRIC BIOPSY;  Surgeon: Daneil Dolin, MD;  [facility]: [facility];  Service: Endoscopy;  Laterality: N/A;   BIOPSY  02/27/2021   Procedure: BIOPSY;  Surgeon: Eloise Harman, DO;  [facility];  Service: Endoscopy;;   BLADDER SURGERY  suspension x4   x 4   CARPAL TUNNEL RELEASE Right 07/11/2015   Procedure: RIGHT CARPAL TUNNEL RELEASE;  Surgeon: Jessy Oto, MD;  Location: [place];  Service: Orthopedics;  Laterality: Right;   CHOLECYSTECTOMY N/A 04/05/2014   Procedure: LAPAROSCOPIC CHOLECYSTECTOMY;  Surgeon: Aviva Signs Md, MD;  Location: [facility];  Service: General;  Laterality: N/A;   COCCYGECTOMY N/A 04/11/2015   Procedure: COCCYGECTOMY WITH OSTEOTOMY THROUGH AREA OF DEFORMITY;  Surgeon: Jessy Oto, MD;  Location: [place];  Service: Orthopedics;  Laterality: N/A;   COLONOSCOPY     COLONOSCOPY WITH PROPOFOL N/A 11/22/2013   Dr. Gala Romney: Grade 3 and 4/internal hemorrhoids?"likely source of hematochezia Normal colonoscopy otherwise.    COLONOSCOPY WITH PROPOFOL N/A 09/15/2017   Dr. Emerson Monte: Hemorrhoids, next colonoscopy 10 years   ESOPHAGOGASTRODUODENOSCOPY (EGD) WITH PROPOFOL N/A 11/22/2013   Dr. Gala Romney: Incomplete Schatzki's ring dilated and disrupted as described above.  Small hiatal hernia. Focally abnormal gastric mucosa of uncertain significance status post biopsy, reactive gastropathy, negative H.pylori   ESOPHAGOGASTRODUODENOSCOPY (EGD) WITH PROPOFOL N/A 08/04/2015   Dr. Gala Romney: mild Schatzki's ring s/p dilation, small hiatal hernia    ESOPHAGOGASTRODUODENOSCOPY (EGD) WITH  PROPOFOL N/A 03/17/2017   Mild Schatki's ring s/p dilation, small hiatal hernia, otherwise normal   ESOPHAGOGASTRODUODENOSCOPY (EGD) WITH PROPOFOL N/A 09/18/2018   Dr. Gala Romney: Schatzki ring status post dilation and disruption.  Hiatal hernia.   ESOPHAGOGASTRODUODENOSCOPY (EGD) WITH PROPOFOL N/A 12/20/2019   non-obstructing Schatzki's ring s/p dilation with 56, 58, and 60 F.   ESOPHAGOGASTRODUODENOSCOPY (EGD) WITH PROPOFOL N/A 02/27/2021   Procedure: ESOPHAGOGASTRODUODENOSCOPY (EGD) WITH PROPOFOL;  Surgeon: Eloise Harman, DO;  [facility];  Service: Endoscopy;  Laterality: N/A;  11:30am   EXCISION VAGINAL CYST Left 06/20/2012   Procedure: EXCISION LEFT LABIAL CYST;  Surgeon: Jonnie Kind,  MD;  [facility];  Service: Gynecology;  Laterality: Left;   LOBECTOMY Left    LUNG CANCER SURGERY  1997   age 36, resection only   MALONEY DILATION N/A 11/22/2013   Procedure: MALONEY DILATION;  Surgeon: Daneil Dolin, MD;  [facility];  Service: Endoscopy;  Laterality: N/A;  [place] N/A 08/04/2015   Procedure: [name] DILATION;  Surgeon: Daneil Dolin, MD;  [facility];  Service: Endoscopy;  Laterality: N/A;   MALONEY DILATION N/A 03/17/2017   Procedure: [name] DILATION;  Surgeon: Daneil Dolin, MD;  [facility];  Service: Endoscopy;  Laterality: N/A;   MALONEY DILATION N/A 09/18/2018   Procedure: [name] DILATION;  Surgeon: Daneil Dolin, MD;  [facility];  Service: Endoscopy;  Laterality: N/A;   MALONEY DILATION N/A 12/20/2019   Procedure: [name] DILATION;  Surgeon: Daneil Dolin, MD;  [facility];  Service: Endoscopy;  Laterality: N/A;   MASS EXCISION N/A 07/23/2014   Procedure: EXCISIONAL BIOPSY NODULE LEFT PARACOCCYGEAL AREA;  Surgeon: Jessy Oto, MD;  Location: [place];  Service: Orthopedics;  Laterality: N/A;   MASS EXCISION Left 10/10/2015   Procedure: EXCISIONAL BIOPSY OF LEFT DORSORADIAL FOREARM  MASS LIPOMA;  Surgeon: Jessy Oto, MD;  Location: [employer];  Service: Orthopedics;  Laterality: Left;   TUBAL LIGATION     Family History  Problem Relation Age of Onset   Diabetes Other    Heart disease Other        has pace maker   Hypertension Mother    Kidney disease Mother    Hypertension Father    Kidney disease Father    Heart disease Father    Cancer Father        leukemia   Hypertension Sister    Hypertension Sister    Colon cancer Neg Hx    Social History   Socioeconomic History   Marital status: Married    Spouse name: Carlo   Number of children: 3   Years of education: Not on file   Highest education level: Not on file  Occupational History   Occupation: Disability  Tobacco Use   Smoking status: Former    Packs/day: 1.50    Years: 25.00    Additional pack years: 0.00    Total pack years: 37.50    Types: Cigarettes    Quit date: 04/07/1995    Years since quitting: 27.0   Smokeless tobacco: Never  Vaping Use   Vaping Use: Never used  Substance and Sexual Activity   Alcohol use: No   Drug use: No   Sexual activity: Not Currently    Birth control/protection: Surgical  Other Topics Concern   Not on file  Social History Narrative   Not on file   Social Determinants of Health   Financial Resource Strain: Low Risk  (04/13/2022)   Overall Financial Resource Strain (CARDIA)    Difficulty of Paying Living Expenses: Not hard at all  Food Insecurity: No Food Insecurity (04/13/2022)   Hunger Vital Sign    Worried About Running Out of Food in the Last Year: Never true    Ran Out of Food in the Last Year: Never true  Transportation Needs: No Transportation Needs (04/13/2022)   PRAPARE - Hydrologist (Medical): No    Lack of Transportation (Non-Medical): No  Physical Activity: Insufficiently Active (04/13/2022)   Exercise Vital Sign  Days of Exercise per Week: 3 days    Minutes of Exercise per Session: 20 min   Stress: No Stress Concern Present (04/13/2022)   [place]    Feeling of Stress : Only a little  Social Connections: Moderately Isolated (04/13/2022)   Social Connection and Isolation Panel [NHANES]    Frequency of Communication with Friends and Family: More than three times a week    Frequency of Social Gatherings with Friends and Family: Three times a week    Attends Religious Services: Never    Active Member of Clubs or Organizations: No    Attends Music therapist: Never    Marital Status: Married    Tobacco Counseling Counseling given: No   Clinical Intake:     Pain : 0-10 Pain Score: 7  Pain Type: Acute pain Pain Location: Wrist Pain Orientation: Left Pain Descriptors / Indicators: Aching, Throbbing Pain Onset: More than a month ago Pain Frequency: Intermittent     Nutritional Risks: None Diabetes: No  How often do you need to have someone help you when you read instructions, pamphlets, or other written materials from your doctor or pharmacy?: 1 - Never  Diabetic? No  Interpreter Needed?: No  Information entered by :: Joanne Chars, CMA   Activities of Daily Living    04/13/2022    8:39 AM  In your present state of health, do you have any difficulty performing the following activities:  Hearing? 0  Vision? 0  Difficulty concentrating or making decisions? 1  Walking or climbing stairs? 1  Dressing or bathing? 0  Doing errands, shopping? 0    Patient Care Team: Shary Key, DO as PCP - General (Family Medicine) Harl Bowie Alphonse Guild, MD as PCP - Cardiology (Cardiology) Gala Romney [name], MD as Consulting Physician (Gastroenterology)  Indicate any recent Medical Services you may have received from other than [facility] providers in the past year (date may be approximate).     Assessment:   This is a routine wellness examination for [name].   Hearing/Vision screen Denies  any hearing issues. Denies any change to her vision. Annual Eye Exam.   Dietary issues and exercise activities discussed: Current Exercise Habits: Structured exercise class, Type of exercise: walking, Time (Minutes): 15, Frequency (Times/Week): 3, Weekly Exercise (Minutes/Week): 45, Intensity: Mild, Exercise limited by: respiratory conditions(s)   Goals Addressed   None    Depression Screen    04/13/2022    8:38 AM 03/05/2022    8:34 AM 01/21/2022    8:27 AM 05/18/2021    9:08 AM 02/09/2021    8:37 AM 12/30/2020    9:10 AM 11/20/2020   10:43 AM  PHQ 2/9 Scores  PHQ - 2 Score 6 6 6 2  0 0 4  PHQ- 9 Score  18 18 2 1  0 10    Fall Risk    04/13/2022    8:39 AM 01/21/2022    8:27 AM 05/18/2021    9:08 AM 10/03/2017    8:24 AM 08/18/2017    8:40 AM  Fall Risk   Falls in the past year? 0 0 0 No No  Number falls in past yr: 0 0 0    Injury with Fall? 0 0 0    Risk for fall due to : No Fall Risks      Follow up Falls evaluation completed        FALL RISK PREVENTION PERTAINING TO THE HOME:  Any  stairs in or around the home? No  If so, are there any without handrails? No  Home free of loose throw rugs in walkways, pet beds, electrical cords, etc? Yes  Adequate lighting in your home to reduce risk of falls? Yes   ASSISTIVE DEVICES UTILIZED TO PREVENT FALLS:  Life alert? No  Use of a cane, walker or w/c? No  Grab bars in the bathroom? No  Shower chair or bench in shower? No  Elevated toilet seat or a handicapped toilet? No   TIMED UP AND GO:  Was the test performed? Unable to perform, virtual appointment   Cognitive Function:        04/13/2022    8:44 AM  6CIT Screen  What Year? 0 points  What month? 0 points  What time? 0 points  Count back from 20 0 points  Months in reverse 0 points  Repeat phrase 2 points  Total Score 2 points    Immunizations Immunization History  Administered Date(s) Administered   Influenza Whole 10/24/2007   Td 09/19/2002    TDAP status: Due,  Education has been provided regarding the importance of this vaccine. Advised may receive this vaccine at local pharmacy or Health Dept. Aware to provide a copy of the vaccination record if obtained from local pharmacy or Health Dept. Verbalized acceptance and understanding.  Flu Vaccine status: Due, Education has been provided regarding the importance of this vaccine. Advised may receive this vaccine at local pharmacy or Health Dept. Aware to provide a copy of the vaccination record if obtained from local pharmacy or Health Dept. Verbalized acceptance and understanding.  Pneumococcal vaccine: not applicable   0000000 vaccine status: Information provided on how to obtain vaccines.   Qualifies for Shingles Vaccine? Yes   Zostavax completed No   Shingrix Completed?: No.    Education has been provided regarding the importance of this vaccine. Patient has been advised to call insurance company to determine out of pocket expense if they have not yet received this vaccine. Advised may also receive vaccine at local pharmacy or Health Dept. Verbalized acceptance and understanding.  Screening Tests Health Maintenance  Topic Date Due   COVID-19 Vaccine (1) Never done   Zoster Vaccines- Shingrix (1 of 2) Never done   DTaP/Tdap/Td (2 - Tdap) 09/18/2012   MAMMOGRAM  11/09/2020   INFLUENZA VACCINE  04/18/2022 (Originally 08/18/2021)   Medicare Annual Wellness (AWV)  04/13/2023   COLONOSCOPY (Pts 45-50yrs Insurance coverage will need to be confirmed)  09/16/2027   Hepatitis C Screening  Completed   HIV Screening  Completed   HPV VACCINES  Aged Out    Health Maintenance  Health Maintenance Due  Topic Date Due   COVID-19 Vaccine (1) Never done   Zoster Vaccines- Shingrix (1 of 2) Never done   DTaP/Tdap/Td (2 - Tdap) 09/18/2012   MAMMOGRAM  11/09/2020    Colorectal cancer screening: Type of screening: Colonoscopy. Completed 09/15/2017. Repeat every 10 years  Mammogram status: Ordered  . Pt  provided with contact info and advised to call to schedule appt.   DEXA Scan: not applicable   Lung Cancer Screening: (Low Dose CT Chest recommended if Age 69-80 years, 30 pack-year currently smoking OR have quit w/in 15years.) does not qualify.   Lung Cancer Screening Referral: not applicable   Additional Screening:  Hepatitis C Screening: does qualify; Completed 12/12/2017  Vision Screening: Recommended annual ophthalmology exams for early detection of glaucoma and other disorders of the eye. Is the patient up  to date with their annual eye exam?  Yes  Who is the provider or what is the name of the office in which the patient attends annual eye exams?  If pt is not established with a provider, would they like to be referred to a provider to establish care? No .   Dental Screening: Recommended annual dental exams for proper oral hygiene  Community Resource Referral / Chronic Care Management: CRR required this visit?  No   CCM required this visit?  No      Plan:     I have personally reviewed and noted the following in the patient's chart:   Medical and social history Use of alcohol, tobacco or illicit drugs  Current medications and supplements including opioid prescriptions. Patient is not currently taking opioid prescriptions. Functional ability and status Nutritional status Physical activity Advanced directives List of other physicians Hospitalizations, surgeries, and ER visits in previous 12 months Vitals Screenings to include cognitive, depression, and falls Referrals and appointments  In addition, I have reviewed and discussed with patient certain preventive protocols, quality metrics, and best practice recommendations. A written personalized care plan for preventive services as well as general preventive health recommendations were provided to patient.     Wilson Singer, [place]   04/13/2022  Nurse Notes: Approximately 30 minute Non-Face -To-Face Medicare  Wellness Visit

## 2022-04-13 ENCOUNTER — Ambulatory Visit (INDEPENDENT_AMBULATORY_CARE_PROVIDER_SITE_OTHER): Payer: Medicare PPO

## 2022-04-13 DIAGNOSIS — Z0000 Encounter for general adult medical examination without abnormal findings: Secondary | ICD-10-CM

## 2022-04-13 DIAGNOSIS — Z1231 Encounter for screening mammogram for malignant neoplasm of breast: Secondary | ICD-10-CM

## 2022-04-13 NOTE — Addendum Note (Signed)
Addended by: Wilson Singer on: 04/13/2022 09:30 AM   Modules accepted: Orders

## 2022-04-16 ENCOUNTER — Other Ambulatory Visit: Payer: Self-pay | Admitting: Family Medicine

## 2022-04-19 ENCOUNTER — Other Ambulatory Visit: Payer: Self-pay | Admitting: Family Medicine

## 2022-04-19 DIAGNOSIS — M48062 Spinal stenosis, lumbar region with neurogenic claudication: Secondary | ICD-10-CM

## 2022-04-20 ENCOUNTER — Ambulatory Visit: Payer: Medicare PPO | Admitting: Physician Assistant

## 2022-04-21 ENCOUNTER — Encounter: Payer: Self-pay | Admitting: Physician Assistant

## 2022-04-21 ENCOUNTER — Ambulatory Visit: Payer: Self-pay | Admitting: Licensed Clinical Social Worker

## 2022-04-21 ENCOUNTER — Ambulatory Visit (INDEPENDENT_AMBULATORY_CARE_PROVIDER_SITE_OTHER): Payer: Medicare PPO | Admitting: Physician Assistant

## 2022-04-21 DIAGNOSIS — G5601 Carpal tunnel syndrome, right upper limb: Secondary | ICD-10-CM

## 2022-04-21 NOTE — Progress Notes (Signed)
Office Visit Note   Patient: [name]           Date of Birth: 06-28-1960           MRN: [id number] Visit Date: 04/21/2022              Requested by: Shary Key, DO [place],  [place] [address] PCP: Shary Key, DO  Chief Complaint  Patient presents with   Left Wrist - Follow-up      HPI: [name] comes in today for follow-up on her left wrist.  She has had a history of cyst on her elbow before and now has a very painful cyst on the volar surface of her left wrist.  She also had some findings consistent with carpal tunnel syndrome.  She has had this on the right side but did not have a cyst.  She had successful carpal tunnel release a few years ago  Assessment & Plan: Visit Diagnoses: Left wrist pain  Plan: Electrodiagnostic studies were consistent with mild medial nerve entrapment.  Also she is more painful over the volar cyst on her wrist difficult to say how much this contributes to her symptoms.  Discussed with her that I think it would be appropriate to refer her to a hand surgeon for possible surgical management of both of these problems.  She may follow-up as needed.  Follow-Up Instructions: No follow-ups on file.   Ortho Exam  Patient is alert, oriented, no adenopathy, well-dressed, normal affect, normal respiratory effort. Left wrist she has a volar cyst on the radial aspect of her wrist.  There is no redness no erythema but it is somewhat tender to palpation.  She has altered sensation in her thumb index and long digit.  Grip strength is slightly Dyke decreased.  No redness or indication of infection she has brisk capillary refill in all of her digits  Imaging: No results found. No images are attached to the encounter.  Labs: Lab Results  Component Value Date   HGBA1C 5.9 05/18/2021   HGBA1C 5.8 (A) 11/20/2020   HGBA1C 5.5 12/10/2017   ESRSEDRATE 37 (H) 11/11/2020   ESRSEDRATE 13 10/10/2008   CRP 0.6 11/11/2020   REPTSTATUS  12/12/2017 FINAL 12/10/2017   GRAMSTAIN  07/23/2014    RARE WBC PRESENT, PREDOMINANTLY PMN NO ORGANISMS SEEN Performed at [facility]  07/23/2014    RARE WBC PRESENT, PREDOMINANTLY PMN NO ORGANISMS SEEN Performed at [place], SUGGEST RECOLLECTION (A) 12/10/2017   LABORGA NO GROWTH 09/27/2012     Lab Results  Component Value Date   ALBUMIN 3.9 11/11/2020   ALBUMIN 4.0 04/01/2018   ALBUMIN 3.8 02/22/2018    Lab Results  Component Value Date   MG 2.6 (H) 12/12/2017   MG 1.8 12/11/2017   MG 1.2 (L) 12/10/2017   No results found for: "VD25OH"  No results found for: "PREALBUMIN"    Latest Ref Rng & Units 03/16/2021    7:47 AM 11/11/2020   12:12 PM 07/09/2020    9:47 AM  CBC EXTENDED  WBC 3.8 - 10.8 Thousand/uL 4.3  3.1  4.5   RBC 3.80 - 5.10 Million/uL 4.23  4.24  4.36   Hemoglobin 11.7 - 15.5 g/dL 13.3  12.5  13.8   HCT 35.0 - 45.0 % 38.9  38.8  40.8   Platelets 140 - 400 Thousand/uL 287  202  290  NEUT# 1,500 - 7,800 cells/uL 2,004  1.0  2,493   Lymph# 850 - 3,900 cells/uL 1,810  1.7  1,472      There is no height or weight on file to calculate BMI.  Orders:  No orders of the defined types were placed in this encounter.  No orders of the defined types were placed in this encounter.    Procedures: No procedures performed  Clinical Data: No additional findings.  ROS:  All other systems negative, except as noted in the HPI. Review of Systems  Objective: Vital Signs: There were no vitals taken for this visit.  Specialty Comments:  06/06/2015 electrodiagnostic study of the right upper and lower limb  Impression:  Essentially ABNORMAL electrodiagnostic study and reveals evidence of a mild to moderate right median nerve entrapment at the wrist (carpal tunnel syndrome) affecting sensory and motor components.  There is no significant electrical diagnostic evidence of nerve entrapment,  cervical radiculopathy, lumbar radiculopathy or generalized peripheral polyneuropathy.   As you know, purely sensory or demyelinating radiculopathy radiculitis may this particularly electrodiagnostic study.  Careful clinical correlation to her symptoms is paramount.  Einar Gip. Ernestina Patches, MD ---  08/09/2014 electrodiagnostic study of the right upper and lower limb  Impression:  Essentially NORMAL electrodiagnostic study of the right.  There is no significant electrical diagnostic evidence of nerve entrapment, cervical radiculopathy, lumbar radiculopathy or generalized peripheral polyneuropathy.  As you know, purely sensory or demyelinating radiculopathy radiculitis may this particularly electrodiagnostic study.  Einar Gip. Ernestina Patches, MD  PMFS History: Patient Active Problem List   Diagnosis Date Noted   Cheilosis 05/21/2021   Burning sensation of toe and foot 05/21/2021   Abdominal pain, epigastric 123456   Lichen planus atrophicus 02/09/2021   Acute conjunctivitis of both eyes 03/25/2020   Radial tunnel syndrome 02/06/2019   Sacroiliac inflammation 05/23/2018   Poor sleep hygiene 05/23/2018   Chronic obstructive pulmonary disease with (acute) exacerbation 05/23/2018   Orthostatic hypotension 05/23/2018   Syncope and collapse 05/23/2018   Elevated fasting glucose 05/23/2018   Nocturia more than twice per night 05/23/2018   Snoring 05/23/2018   Hemorrhoid 02/15/2018   Abdominal distention 02/15/2018   Anemia 01/09/2018   Pain of upper abdomen 01/09/2018   Healthcare maintenance 12/30/2017   Antibiotic-associated diarrhea 12/22/2017   Vagina itching 12/22/2017   Sepsis    Respiratory distress    Partial small bowel obstruction 12/11/2017   COPD with acute exacerbation 12/11/2017   Hypophosphatemia 12/11/2017   Sepsis due to undetermined organism 12/10/2017   AKI (acute kidney injury) 12/10/2017   Hyperbilirubinemia 12/10/2017   Aortic regurgitation 123456   Diastolic  dysfunction 123456   Dysphagia 01/20/2017   Left hip pain 01/19/2017   Right hand pain 01/19/2017   Diarrhea 10/11/2016   Right hip pain 06/07/2016   Dysuria 04/14/2016   Malaise and fatigue 04/14/2016   Breast pain, left 11/06/2015   Mass of left forearm 10/10/2015    Class: Chronic   Dysphagia, oropharyngeal    Gastroesophageal reflux disease without esophagitis    Carpal tunnel syndrome, right 07/11/2015    Class: Chronic   Prediabetes 07/09/2015   Lumbar stenosis with neurogenic claudication 01/05/2015   Spondylolisthesis of lumbar region 01/03/2015    Class: Chronic   Soft tissue mass 07/23/2014    Class: Acute   Coccygodynia 07/23/2014    Class: Acute   Constipation 02/15/2014   Dysphagia, pharyngoesophageal phase    Elevated alkaline phosphatase level 11/06/2013   Rectal bleeding 11/06/2013  Obesity, unspecified 02/15/2013   Genital herpes 10/12/2012   HSV (herpes simplex virus) anogenital infection 09/29/2012   UNSPECIFIED SLEEP APNEA 12/04/2008   ADENOCARCINOMA, LUNG 10/09/2008   Osteoarthritis of multiple joints 07/13/2006   HYPERCHOLESTEROLEMIA 03/17/2006   Major depressive disorder, recurrent episode 03/17/2006   HYPERTENSION, BENIGN SYSTEMIC 03/17/2006   GASTROESOPHAGEAL REFLUX, NO ESOPHAGITIS 03/17/2006   Insomnia due to medical condition 03/17/2006   Past Medical History:  Diagnosis Date   Anxiety    Aortic insufficiency    a. 10/2017 Echo: Mod AI; b. 04/2020 Echo: Mod AI.   Asthma    every now and then.  Wheezing and coughing   Cancer 1997   left lung   Chronic abdominal pain    Chronic back pain    Chronic joint pain    Depression    Diastolic dysfunction    a. 10/2017 Echo: EF 60-65%, GrII DD, mod AI; b. 04/2020 Echo: EF 60-65%, no rwma, GrI DD, nl RV fxn, mod AI.   Essential hypertension    HLD (hyperlipidemia)    Palpitations 01/2013   a. 11/2017 Holter: Occas PACs, rare PVCs. Rare nonsustained atrial tachycardia; b. 01/2018 Event  monitor: Predominantly sinus rhythm @ 90 (64-135). No significant arrhythmias.   Shortness of breath dyspnea    with exertion    Family History  Problem Relation Age of Onset   Diabetes Other    Heart disease Other        has pace maker   Hypertension Mother    Kidney disease Mother    Hypertension Father    Kidney disease Father    Heart disease Father    Cancer Father        leukemia   Hypertension Sister    Hypertension Sister    Colon cancer Neg Hx     Past Surgical History:  Procedure Laterality Date   ABDOMINAL HYSTERECTOMY     ABDOMINAL SURGERY     ovarian cyst removal   BALLOON DILATION N/A 02/27/2021   Procedure: BALLOON DILATION;  Surgeon: Eloise Harman, DO;  [facility];  Service: Endoscopy;  Laterality: N/A;   BIOPSY N/A 11/22/2013   Procedure: GASTRIC BIOPSY;  Surgeon: Daneil Dolin, MD;  [facility]: [facility];  Service: Endoscopy;  Laterality: N/A;   BIOPSY  02/27/2021   Procedure: BIOPSY;  Surgeon: Eloise Harman, DO;  [facility];  Service: Endoscopy;;   BLADDER SURGERY  suspension x4   x 4   CARPAL TUNNEL RELEASE Right 07/11/2015   Procedure: RIGHT CARPAL TUNNEL RELEASE;  Surgeon: Jessy Oto, MD;  Location: [facility];  Service: Orthopedics;  Laterality: Right;   CHOLECYSTECTOMY N/A 04/05/2014   Procedure: LAPAROSCOPIC CHOLECYSTECTOMY;  Surgeon: Aviva Signs Md, MD;  Location: [facility];  Service: General;  Laterality: N/A;   COCCYGECTOMY N/A 04/11/2015   Procedure: COCCYGECTOMY WITH OSTEOTOMY THROUGH AREA OF DEFORMITY;  Surgeon: Jessy Oto, MD;  Location: [facility];  Service: Orthopedics;  Laterality: N/A;   COLONOSCOPY     COLONOSCOPY WITH PROPOFOL N/A 11/22/2013   Dr. Gala Romney: Grade 3 and 4/internal hemorrhoids?"likely source of hematochezia Normal colonoscopy otherwise.    COLONOSCOPY WITH PROPOFOL N/A 09/15/2017   Dr. Emerson Monte: Hemorrhoids, next colonoscopy 10 years   ESOPHAGOGASTRODUODENOSCOPY (EGD) WITH PROPOFOL  N/A 11/22/2013   Dr. Gala Romney: Incomplete Schatzki's ring dilated and disrupted as described above.  Small hiatal hernia. Focally abnormal gastric mucosa of uncertain significance status post biopsy, reactive gastropathy, negative H.pylori  ESOPHAGOGASTRODUODENOSCOPY (EGD) WITH PROPOFOL N/A 08/04/2015   Dr. Gala Romney: mild Schatzki's ring s/p dilation, small hiatal hernia    ESOPHAGOGASTRODUODENOSCOPY (EGD) WITH PROPOFOL N/A 03/17/2017   Mild Schatki's ring s/p dilation, small hiatal hernia, otherwise normal   ESOPHAGOGASTRODUODENOSCOPY (EGD) WITH PROPOFOL N/A 09/18/2018   Dr. Gala Romney: Schatzki ring status post dilation and disruption.  Hiatal hernia.   ESOPHAGOGASTRODUODENOSCOPY (EGD) WITH PROPOFOL N/A 12/20/2019   non-obstructing Schatzki's ring s/p dilation with 56, 58, and 60 F.   ESOPHAGOGASTRODUODENOSCOPY (EGD) WITH PROPOFOL N/A 02/27/2021   Procedure: ESOPHAGOGASTRODUODENOSCOPY (EGD) WITH PROPOFOL;  Surgeon: Eloise Harman, DO;  [facility];  Service: Endoscopy;  Laterality: N/A;  11:30am   EXCISION VAGINAL CYST Left 06/20/2012   Procedure: EXCISION LEFT LABIAL CYST;  Surgeon: Jonnie Kind, MD;  [facility];  Service: Gynecology;  Laterality: Left;   LOBECTOMY Left    LUNG CANCER SURGERY  1997   age 11, resection only   MALONEY DILATION N/A 11/22/2013   Procedure: MALONEY DILATION;  Surgeon: Daneil Dolin, MD;  [facility];  Service: Endoscopy;  Laterality: N/A;  [place] N/A 08/04/2015   Procedure: [name] DILATION;  Surgeon: Daneil Dolin, MD;  [facility];  Service: Endoscopy;  Laterality: N/A;   MALONEY DILATION N/A 03/17/2017   Procedure: [name] DILATION;  Surgeon: Daneil Dolin, MD;  [facility];  Service: Endoscopy;  Laterality: N/A;   MALONEY DILATION N/A 09/18/2018   Procedure: [name] DILATION;  Surgeon: Daneil Dolin, MD;  [facility];  Service: Endoscopy;  Laterality: N/A;   MALONEY DILATION N/A  12/20/2019   Procedure: [name] DILATION;  Surgeon: Daneil Dolin, MD;  [facility];  Service: Endoscopy;  Laterality: N/A;   MASS EXCISION N/A 07/23/2014   Procedure: EXCISIONAL BIOPSY NODULE LEFT PARACOCCYGEAL AREA;  Surgeon: Jessy Oto, MD;  Location: [facility];  Service: Orthopedics;  Laterality: N/A;   MASS EXCISION Left 10/10/2015   Procedure: EXCISIONAL BIOPSY OF LEFT DORSORADIAL FOREARM MASS LIPOMA;  Surgeon: Jessy Oto, MD;  Location: [facility];  Service: Orthopedics;  Laterality: Left;   TUBAL LIGATION     Social History   Occupational History   Occupation: Disability  Tobacco Use   Smoking status: Former    Packs/day: 1.50    Years: 25.00    Additional pack years: 0.00    Total pack years: 37.50    Types: Cigarettes    Quit date: 04/07/1995    Years since quitting: 27.0   Smokeless tobacco: Never  Vaping Use   Vaping Use: Never used  Substance and Sexual Activity   Alcohol use: No   Drug use: No   Sexual activity: Not Currently    Birth control/protection: Surgical

## 2022-04-21 NOTE — Addendum Note (Signed)
Addended by: Erich Montane on: 04/21/2022 09:57 AM   Modules accepted: Orders

## 2022-04-21 NOTE — Patient Outreach (Signed)
  Care Coordination   Follow Up Visit Note   04/21/2022 Name: [name] MRN: [id number] DOB: 1960-12-06  [name] is a 62 y.o. year old female who sees Shary Key, DO for primary care. I spoke with  [name] , sister of client, via phone today about client needs.   What matters to the patients health and wellness today?   LCSW spoke via phone today with [name], sister of client, and witih [name], client, about client needs. Name and phone number  of LCSW given to [facility] and client for return calls as needed to [place]             This Visit's Progress    LCSW spoke via phone today with [name], sister of client, and witih [name], client, about client needs. Name and phone number  of LCSW given to [facility] and client for return calls as needed to [facility]        Interventions: LCSW spoke via phone today with [name], sister of client and with [name], client, about client needs LCSW gave [name] and client, information about program support with RN, LCSW, and pharmacist LCSW provided [name] and client with LCSW name and phone number for return calls as needed Encouraged client to talk with PCP about program support Client agreed for LCSW to call her back in 2 months to further discuss program support. She said she would talk with PCP about program support .        SDOH assessments and interventions completed:  Yes  SDOH Interventions Today    Flowsheet Row Most Recent Value  SDOH Interventions   Depression Interventions/Treatment  Medication  Physical Activity Interventions Other (Comments)  Stress Interventions Other (Comment)  [has stress in managing medical needs]        Care Coordination Interventions:  Yes, provided   Interventions Today    Flowsheet Row Most Recent Value  Chronic Disease   Chronic disease during today's visit Other  [LCSW spoke with [name], sister of client, about client needs]  General  Interventions   General Interventions Discussed/Reviewed General Interventions Discussed, Community Resources  [discussed program support]  Education Interventions   Education Provided Provided Education  Provided Orthoptist On [place] Discussed/Reviewed Anxiety, Coping Strategies        Follow up plan: Follow up call scheduled for 06/21/22 at 1:00 PM     Encounter Outcome:  Pt. Visit Completed   Norva Riffle.Issa Kosmicki MSW, [place] [facility] representative [facility] Care Management [phone]

## 2022-04-21 NOTE — Patient Instructions (Signed)
Visit Information  Thank you for taking time to visit with me today. Please don't hesitate to contact me if I can be of assistance to you.   Following are the goals we discussed today:   Goals Addressed             This Visit's Progress    LCSW spoke via phone today with [name], sister of client, and witih [name], client, about client needs. ame and phone number and will give this number to client to call       Interventions: LCSW spoke via phone today with [name], sister of client and with [name], client, about client needs LCSW gave [name] and client, information about program support with RN, LCSW, and pharmacist LCSW provided [name] and client with LCSW name and phone number for return calls as needed Encouraged client to talk with PCP about program support Client agreed for LCSW to call her back in 2 months to further discuss program support. She said she would talk with PCP about program support .      Our next appointment is by telephone on 06/21/22  at 1:00 PM   Please call the care guide team at [phone] if you need to cancel or reschedule your appointment.   If you are experiencing a Mental Health or [place] or need someone to talk to, please go to [facility] [phone])   The patient verbalized understanding of instructions, educational materials, and care plan provided today and DECLINED offer to receive copy of patient instructions, educational materials, and care plan.   The patient has been provided with contact information for the care management team and has been advised to call with any health related questions or concerns.   [name].Codee Bloodworth MSW, [place] [employer] representative [facility] Care Management [phone]

## 2022-04-22 ENCOUNTER — Other Ambulatory Visit (HOSPITAL_COMMUNITY): Payer: Medicare PPO

## 2022-04-26 DIAGNOSIS — G5601 Carpal tunnel syndrome, right upper limb: Secondary | ICD-10-CM | POA: Diagnosis not present

## 2022-04-26 DIAGNOSIS — M67431 Ganglion, right wrist: Secondary | ICD-10-CM | POA: Diagnosis not present

## 2022-04-29 ENCOUNTER — Ambulatory Visit: Payer: Medicare PPO | Admitting: Student

## 2022-05-03 ENCOUNTER — Telehealth: Payer: Self-pay | Admitting: Cardiology

## 2022-05-03 ENCOUNTER — Telehealth: Payer: Self-pay

## 2022-05-03 MED ORDER — AMLODIPINE BESYLATE 10 MG PO TABS: 10.0000 mg | ORAL_TABLET | Freq: Every day | ORAL | 1 refills | Status: DC

## 2022-05-03 NOTE — Telephone Encounter (Signed)
Pt c/o BP issue: STAT if pt c/o blurred vision, one-sided weakness or slurred speech  1. What are your last 5 BP readings?  171/?? today   2. Are you having any other symptoms (ex. Dizziness, headache, blurred vision, passed out)?  Headache, fatigue  3. What is your BP issue?   Systolic has been elevated, in the 170's-190's. Patient would like to know if she can start back on her BP medication.

## 2022-05-03 NOTE — Telephone Encounter (Signed)
Spoke to patient who stated that last week her bp systolic had ranged from 191-199, and is now coming down into the 170's. Pt stated that she has been having headaches, fatigue, and nauseated. Pt stated she is currently taking Diltiazem 120 mg capsules daily as prescribed, but would like to restart Amlodipine.   *Pt had no other bp readings**   Per last OV note with Flavia Shipper, NP:  Essential hypertension: Blood pressure elevated on 2 consecutive checks today-142/70 and 144/68. She previously was on amlodipine but discontinued this on her own. She is currently only taking Lasix 20 mg daily and prefers not to have to take this, she does have frequent urination and nocturia. I have asked her to drop her Lasix back to 20 mg once a day as needed for lower extremity edema only. Given more frequent palpitations, adding diltiazem CD1 120 mg daily which will hopefully address both palpitations and hypertension.   Please advise

## 2022-05-03 NOTE — Telephone Encounter (Signed)
Patient calls nurse line reporting elevated blood pressures over the last week.   She reports her blood pressure medication was recently changed. Patient was unable to give me complete pressure readings. She reported "171" systolic, however did not know diastolic.   She denies any vision changes, chest pains or SOB at this time. She did report a headache last week.   Patient advised to call her Cardiologist for further management.   Precautions discussed.

## 2022-05-03 NOTE — Telephone Encounter (Signed)
Left message to return call 

## 2022-05-03 NOTE — Telephone Encounter (Signed)
She may resume taking amlodipine 10 mg daily.  As this is the same class as diltiazem, she should stop the diltiazem.  More importantly, if BPs are trending that high, she will  need an office visit w/in the week to re-eval and adjust her antihypertensive regimen.

## 2022-05-03 NOTE — Telephone Encounter (Signed)
I spoke with patient and she will stop diltiazem.She requests 90 day supply of amlodipine 10 mg qd sent to [facility]. She will take BP 2 hours after taking amlodipine each day and keep BP log.

## 2022-05-04 ENCOUNTER — Other Ambulatory Visit: Payer: Self-pay | Admitting: Family Medicine

## 2022-05-04 ENCOUNTER — Other Ambulatory Visit: Payer: Self-pay | Admitting: Gastroenterology

## 2022-05-04 DIAGNOSIS — K219 Gastro-esophageal reflux disease without esophagitis: Secondary | ICD-10-CM

## 2022-05-10 ENCOUNTER — Other Ambulatory Visit: Payer: Self-pay

## 2022-05-10 ENCOUNTER — Encounter (HOSPITAL_COMMUNITY)
Admission: RE | Admit: 2022-05-10 | Discharge: 2022-05-10 | Disposition: A | Discharge status: Home / Self Care | Payer: Medicare PPO | Source: Ambulatory Visit | Attending: Internal Medicine | Admitting: Internal Medicine

## 2022-05-10 ENCOUNTER — Encounter (HOSPITAL_COMMUNITY): Payer: Self-pay

## 2022-05-10 NOTE — Progress Notes (Signed)
Pt completed PAT phone call. Pt verbalized understanding of instructions and arrival time

## 2022-05-12 ENCOUNTER — Encounter (HOSPITAL_COMMUNITY)
Admission: RE | Disposition: A | Discharge status: Home / Self Care | Payer: Self-pay | Source: Home / Self Care | Attending: Internal Medicine

## 2022-05-12 ENCOUNTER — Encounter (HOSPITAL_COMMUNITY): Payer: Self-pay | Admitting: Internal Medicine

## 2022-05-12 ENCOUNTER — Ambulatory Visit (HOSPITAL_COMMUNITY): Payer: Medicare PPO | Admitting: Anesthesiology

## 2022-05-12 ENCOUNTER — Other Ambulatory Visit: Payer: Self-pay

## 2022-05-12 ENCOUNTER — Encounter (HOSPITAL_BASED_OUTPATIENT_CLINIC_OR_DEPARTMENT_OTHER): Payer: Self-pay | Admitting: Orthopedic Surgery

## 2022-05-12 ENCOUNTER — Ambulatory Visit (HOSPITAL_COMMUNITY)
Admission: RE | Admit: 2022-05-12 | Discharge: 2022-05-12 | Disposition: A | Discharge status: Home / Self Care | Payer: Medicare PPO | Attending: Internal Medicine | Admitting: Internal Medicine

## 2022-05-12 ENCOUNTER — Ambulatory Visit (HOSPITAL_BASED_OUTPATIENT_CLINIC_OR_DEPARTMENT_OTHER): Payer: Medicare PPO | Admitting: Anesthesiology

## 2022-05-12 DIAGNOSIS — R1310 Dysphagia, unspecified: Secondary | ICD-10-CM | POA: Insufficient documentation

## 2022-05-12 DIAGNOSIS — Z87891 Personal history of nicotine dependence: Secondary | ICD-10-CM

## 2022-05-12 DIAGNOSIS — I351 Nonrheumatic aortic (valve) insufficiency: Secondary | ICD-10-CM | POA: Insufficient documentation

## 2022-05-12 DIAGNOSIS — D759 Disease of blood and blood-forming organs, unspecified: Secondary | ICD-10-CM | POA: Insufficient documentation

## 2022-05-12 DIAGNOSIS — J45909 Unspecified asthma, uncomplicated: Secondary | ICD-10-CM

## 2022-05-12 DIAGNOSIS — J449 Chronic obstructive pulmonary disease, unspecified: Secondary | ICD-10-CM | POA: Diagnosis not present

## 2022-05-12 DIAGNOSIS — M1990 Unspecified osteoarthritis, unspecified site: Secondary | ICD-10-CM | POA: Diagnosis not present

## 2022-05-12 DIAGNOSIS — K2100 Gastro-esophageal reflux disease with esophagitis, without bleeding: Secondary | ICD-10-CM

## 2022-05-12 DIAGNOSIS — G4730 Sleep apnea, unspecified: Secondary | ICD-10-CM | POA: Diagnosis not present

## 2022-05-12 DIAGNOSIS — D649 Anemia, unspecified: Secondary | ICD-10-CM | POA: Insufficient documentation

## 2022-05-12 DIAGNOSIS — K219 Gastro-esophageal reflux disease without esophagitis: Secondary | ICD-10-CM | POA: Diagnosis not present

## 2022-05-12 DIAGNOSIS — I10 Essential (primary) hypertension: Secondary | ICD-10-CM | POA: Diagnosis not present

## 2022-05-12 DIAGNOSIS — F419 Anxiety disorder, unspecified: Secondary | ICD-10-CM | POA: Insufficient documentation

## 2022-05-12 DIAGNOSIS — F32A Depression, unspecified: Secondary | ICD-10-CM | POA: Diagnosis not present

## 2022-05-12 DIAGNOSIS — R1013 Epigastric pain: Secondary | ICD-10-CM

## 2022-05-12 HISTORY — PX: MALONEY DILATION: SHX5535

## 2022-05-12 HISTORY — PX: ESOPHAGOGASTRODUODENOSCOPY (EGD) WITH PROPOFOL: SHX5813

## 2022-05-12 HISTORY — PX: BIOPSY: SHX5522

## 2022-05-12 SURGERY — ESOPHAGOGASTRODUODENOSCOPY (EGD) WITH PROPOFOL
Anesthesia: General

## 2022-05-12 MED ORDER — LACTATED RINGERS IV SOLN: INTRAVENOUS | Status: DC

## 2022-05-12 MED ORDER — PROPOFOL 500 MG/50ML IV EMUL
INTRAVENOUS | Status: AC
  Filled 2022-05-12: qty 50

## 2022-05-12 MED ORDER — LIDOCAINE HCL (CARDIAC) PF 100 MG/5ML IV SOSY
PREFILLED_SYRINGE | INTRAVENOUS | Status: DC | PRN
  Administered 2022-05-12: 50 mg via INTRAVENOUS

## 2022-05-12 MED ORDER — PROPOFOL 10 MG/ML IV BOLUS
INTRAVENOUS | Status: DC | PRN
  Administered 2022-05-12: 80 mg via INTRAVENOUS

## 2022-05-12 MED ORDER — PROPOFOL 500 MG/50ML IV EMUL
INTRAVENOUS | Status: DC | PRN
  Administered 2022-05-12: 180 ug/kg/min via INTRAVENOUS

## 2022-05-12 MED ORDER — LIDOCAINE HCL (PF) 2 % IJ SOLN
INTRAMUSCULAR | Status: AC
  Filled 2022-05-12: qty 5

## 2022-05-12 NOTE — Transfer of Care (Signed)
Immediate Anesthesia Transfer of Care Note  Patient: [name]  Procedure(s) Performed: ESOPHAGOGASTRODUODENOSCOPY (EGD) WITH PROPOFOL MALONEY DILATION BIOPSY  Patient Location: Short Stay  Anesthesia Type:General  Level of Consciousness: awake, alert , and oriented  Airway & Oxygen Therapy: Patient Spontanous Breathing  Post-op Assessment: Report given to RN, Post -op Vital signs reviewed and stable, Patient moving all extremities X 4, and Patient able to stick tongue midline  Post vital signs: Reviewed  Last Vitals:  Vitals Value Taken Time  BP 115/52   Temp 97.8   Pulse 88   Resp 19   SpO2 96     Last Pain:  Vitals:   05/12/22 0835  PainSc: 0-No pain         Complications: No notable events documented.

## 2022-05-12 NOTE — H&P (Incomplete)
@LOGO @   Primary Care Physician:  Cora Collum, DO Primary Gastroenterologist:  Dr. Jena Gauss  Pre-Procedure History & Physical: HPI:  [name] is a 62 y.o. female here for further evaluation of dysphagia.  EGD with esophageal dilation is feasible/appropriate.  Past Medical History:  Diagnosis Date  . Anxiety   . Aortic insufficiency    a. 10/2017 Echo: Mod AI; b. 04/2020 Echo: Mod AI.  Marland Kitchen Asthma    every now and then.  Wheezing and coughing  . Cancer 1997   left lung  . Chronic abdominal pain   . Chronic back pain   . Chronic joint pain   . Depression   . Diastolic dysfunction    a. 10/2017 Echo: EF 60-65%, GrII DD, mod AI; b. 04/2020 Echo: EF 60-65%, no rwma, GrI DD, nl RV fxn, mod AI.  Marland Kitchen Essential hypertension   . HLD (hyperlipidemia)   . Palpitations 01/2013   a. 11/2017 Holter: Occas PACs, rare PVCs. Rare nonsustained atrial tachycardia; b. 01/2018 Event monitor: Predominantly sinus rhythm @ 90 (64-135). No significant arrhythmias.  . Shortness of breath dyspnea    with exertion    Past Surgical History:  Procedure Laterality Date  . ABDOMINAL HYSTERECTOMY    . ABDOMINAL SURGERY     ovarian cyst removal  . BALLOON DILATION N/A 02/27/2021   Procedure: BALLOON DILATION;  Surgeon: [name], DO;  [facility];  Service: Endoscopy;  Laterality: N/A;  . BIOPSY N/A 11/22/2013   Procedure: GASTRIC BIOPSY;  Surgeon: [name], MD;  Location: [facility];  Service: Endoscopy;  Laterality: N/A;  . BIOPSY  02/27/2021   Procedure: BIOPSY;  Surgeon: [name], DO;  [facility];  Service: Endoscopy;;  . BLADDER SURGERY  suspension x4   x 4  . CARPAL TUNNEL RELEASE Right 07/11/2015   Procedure: RIGHT CARPAL TUNNEL RELEASE;  Surgeon: Kerrin Champagne, MD;  Location: [facility];  Service: Orthopedics;  Laterality: Right;  . CHOLECYSTECTOMY N/A 04/05/2014   Procedure: LAPAROSCOPIC CHOLECYSTECTOMY;  Surgeon: Franky Macho Md, MD;   Location: [facility];  Service: General;  Laterality: N/A;  . COCCYGECTOMY N/A 04/11/2015   Procedure: COCCYGECTOMY WITH OSTEOTOMY THROUGH AREA OF DEFORMITY;  Surgeon: Kerrin Champagne, MD;  Location: [facility];  Service: Orthopedics;  Laterality: N/A;  . COLONOSCOPY    . COLONOSCOPY WITH PROPOFOL N/A 11/22/2013   Dr. Jena Gauss: Grade 3 and 4/internal hemorrhoids?"likely source of hematochezia Normal colonoscopy otherwise.   Marland Kitchen COLONOSCOPY WITH PROPOFOL N/A 09/15/2017   Dr. Lovena Neighbours: Hemorrhoids, next colonoscopy 10 years  . ESOPHAGOGASTRODUODENOSCOPY (EGD) WITH PROPOFOL N/A 11/22/2013   Dr. Jena Gauss: Incomplete Schatzki's ring dilated and disrupted as described above.  Small hiatal hernia. Focally abnormal gastric mucosa of uncertain significance status post biopsy, reactive gastropathy, negative H.pylori  . ESOPHAGOGASTRODUODENOSCOPY (EGD) WITH PROPOFOL N/A 08/04/2015   Dr. Jena Gauss: mild Schatzki's ring s/p dilation, small hiatal hernia   . ESOPHAGOGASTRODUODENOSCOPY (EGD) WITH PROPOFOL N/A 03/17/2017   Mild Schatki's ring s/p dilation, small hiatal hernia, otherwise normal  . ESOPHAGOGASTRODUODENOSCOPY (EGD) WITH PROPOFOL N/A 09/18/2018   Dr. Jena Gauss: Schatzki ring status post dilation and disruption.  Hiatal hernia.  Marland Kitchen ESOPHAGOGASTRODUODENOSCOPY (EGD) WITH PROPOFOL N/A 12/20/2019   non-obstructing Schatzki's ring s/p dilation with 56, 58, and 60 F.  . ESOPHAGOGASTRODUODENOSCOPY (EGD) WITH PROPOFOL N/A 02/27/2021   Procedure: ESOPHAGOGASTRODUODENOSCOPY (EGD) WITH PROPOFOL;  Surgeon: [name], DO;  [facility];  Service: Endoscopy;  Laterality:  N/A;  11:30am  . EXCISION VAGINAL CYST Left 06/20/2012   Procedure: EXCISION LEFT LABIAL CYST;  Surgeon: [name], MD;  Location: [facility];  Service: Gynecology;  Laterality: Left;  . LOBECTOMY Left   . LUNG CANCER SURGERY  1997   age 35, resection only  . MALONEY DILATION N/A 11/22/2013   Procedure: Elease Hashimoto DILATION;  Surgeon: [name], MD;   Location: [facility];  Service: Endoscopy;  Laterality: N/A;  56  . MALONEY DILATION N/A 08/04/2015   Procedure: Elease Hashimoto DILATION;  Surgeon: [name], MD;  [facility];  Service: Endoscopy;  Laterality: N/A;  . Elease Hashimoto DILATION N/A 03/17/2017   Procedure: Elease Hashimoto DILATION;  Surgeon: [name], MD;  [facility];  Service: Endoscopy;  Laterality: N/A;  . Elease Hashimoto DILATION N/A 09/18/2018   Procedure: Elease Hashimoto DILATION;  Surgeon: [name], MD;  [facility];  Service: Endoscopy;  Laterality: N/A;  Elease Hashimoto DILATION N/A 12/20/2019   Procedure: Elease Hashimoto DILATION;  Surgeon: [name], MD;  [facility];  Service: Endoscopy;  Laterality: N/A;  . MASS EXCISION N/A 07/23/2014   Procedure: EXCISIONAL BIOPSY NODULE LEFT PARACOCCYGEAL AREA;  Surgeon: Kerrin Champagne, MD;  Location[facility];  Service: Orthopedics;  Laterality: N/A;  . MASS EXCISION Left 10/10/2015   Procedure: EXCISIONAL BIOPSY OF LEFT DORSORADIAL FOREARM MASS LIPOMA;  Surgeon: Kerrin Champagne, MD;  Location: [facility];  Service: Orthopedics;  Laterality: Left;  . TUBAL LIGATION      Prior to Admission medications   Medication Sig Start Date End Date Taking? Authorizing Provider  acyclovir (ZOVIRAX) 400 MG tablet Take 1 tablet by mouth twice daily 02/22/22  Yes Paige, Victoria J, DO  amitriptyline (ELAVIL) 150 MG tablet Take 1 tablet by mouth once daily 04/21/22  Yes Paige, Victoria J, DO  amLODipine (NORVASC) 10 MG tablet Take 1 tablet (10 mg total) by mouth daily. 05/03/22 05/03/23 Yes Creig Hines, NP  atorvastatin (LIPITOR) 40 MG tablet Take 1 tablet (40 mg total) by mouth daily. [facility] March 2024. 03/17/22  Yes Fayette Pho, MD  budesonide-formoterol [facility]) 160-4.5 MCG/ACT inhaler Inhale 2 puffs into the lungs 2 (two) times daily. Patient taking differently: Inhale 2 puffs into the lungs 2 (two)  times daily as needed (shortness of breath). 01/22/22  Yes Paige, Lucas Mallow, DO  celecoxib (CELEBREX) 200 MG capsule Take 1 capsule by mouth once daily 03/29/22  Yes Paige, Victoria J, DO  DULoxetine (CYMBALTA) 60 MG capsule Take 1 capsule by mouth twice daily 01/26/22  Yes Paige, Victoria J, DO  esomeprazole (NEXIUM) 40 MG capsule TAKE 1 CAPSULE BY MOUTH ONCE DAILY AT NOON 05/04/22  Yes Mahon, Frederik Schmidt, NP  furosemide (LASIX) 20 MG tablet Take 1 tablet (20 mg total) by mouth daily as needed for edema. 03/25/22  Yes Creig Hines, NP  linaclotide [facility]) 290 MCG CAPS capsule TAKE 1 CAPSULE BY MOUTH ONCE DAILY BEFORE BREAKFAST 04/05/22  Yes Mahon, Courtney L, NP  pregabalin (LYRICA) 100 MG capsule TAKE 1 CAPSULE BY MOUTH THREE TIMES DAILY Patient taking differently: Take 100 mg by mouth 2 (two) times daily. 04/13/22  Yes Paige, Turkey J, DO  terbinafine (LAMISIL) 1 % cream Apply 1 application. topically 2 (two) times daily. 05/18/21  Yes Paige, Victoria J, DO  buPROPion (WELLBUTRIN XL) 150 MG 24 hr tablet Take 1 tablet (150 mg total) by  mouth daily. Patient not taking: Reported on 05/05/2022 03/05/22   Cora Collum, DO    Allergies as of 04/05/2022 - Review Complete 04/05/2022  Allergen Reaction Noted  . Promethazine hcl Nausea And Vomiting and Other (See Comments) 07/26/2008    Family History  Problem Relation Age of Onset  . Diabetes Other   . Heart disease Other        has Visual merchandiser  . Hypertension Mother   . Kidney disease Mother   . Hypertension Father   . Kidney disease Father   . Heart disease Father   . Cancer Father        leukemia  . Hypertension Sister   . Hypertension Sister   . Colon cancer Neg Hx     Social History   Socioeconomic History  . Marital status: Married    Spouse name: Carlo  . Number of children: 3  . Years of education: Not on file  . Highest education level: Not on file  Occupational History  . Occupation: Disability  Tobacco Use  .  Smoking status: Former    Packs/day: 1.50    Years: 25.00    Additional pack years: 0.00    Total pack years: 37.50    Types: Cigarettes    Quit date: 04/07/1995    Years since quitting: 27.1  . Smokeless tobacco: Never  Vaping Use  . Vaping Use: Never used  Substance and Sexual Activity  . Alcohol use: No  . Drug use: No  . Sexual activity: Not Currently    Birth control/protection: Surgical  Other Topics Concern  . Not on file  Social History Narrative  . Not on file   Social Determinants of Health   Financial Resource Strain: Low Risk  (04/13/2022)   Overall Financial Resource Strain (CARDIA)   . Difficulty of Paying Living Expenses: Not hard at all  Food Insecurity: No Food Insecurity (04/13/2022)   Hunger Vital Sign   . Worried About Programme researcher, broadcasting/film/video in the Last Year: Never true   . Ran Out of Food in the Last Year: Never true  Transportation Needs: No Transportation Needs (04/13/2022)   PRAPARE - Transportation   . Lack of Transportation (Medical): No   . Lack of Transportation (Non-Medical): No  Physical Activity: Insufficiently Active (04/21/2022)   Exercise Vital Sign   . Days of Exercise per Week: 3 days   . Minutes of Exercise per Session: 10 min  Stress: Stress Concern Present (04/21/2022)   [employer] of Occupational Health - Occupational Stress Questionnaire   . Feeling of Stress : To some extent  Social Connections: Moderately Isolated (04/13/2022)   Social Connection and Isolation Panel [NHANES]   . Frequency of Communication with Friends and Family: More than three times a week   . Frequency of Social Gatherings with Friends and Family: Three times a week   . Attends Religious Services: Never   . Active Member of Clubs or Organizations: No   . Attends Banker Meetings: Never   . Marital Status: Married  Catering manager Violence: Not At Risk (04/13/2022)   Humiliation, Afraid, Rape, and Kick questionnaire   . Fear of Current or  Ex-Partner: No   . Emotionally Abused: No   . Physically Abused: No   . Sexually Abused: No    Review of Systems: See HPI, otherwise negative ROS  Physical Exam: BP (!) 150/60 (BP Location: Right Arm)   Pulse 84   Temp 98.5 F (  36.9 C)   Resp 18   Ht  (1.626 m)   Wt 78.9 kg   SpO2 98%   BMI 29.86 kg/m  General:   Alert,  Well-developed, well-nourished, pleasant and cooperative in NAD Skin:  Intact without significant lesions or rashes. Eyes:  Sclera clear, no icterus.   Conjunctiva pink. Ears:  Normal auditory acuity. Nose:  No deformity, discharge,  or lesions. Mouth:  No deformity or lesions. Neck:  Supple; no masses or thyromegaly. No significant cervical adenopathy. Lungs:  Clear throughout to auscultation.   No wheezes, crackles, or rhonchi. No acute distress. Heart:  Regular rate and rhythm; no murmurs, clicks, rubs,  or gallops. Abdomen: Non-distended, normal bowel sounds.  Soft and nontender without appreciable mass or hepatosplenomegaly.  Pulses:  Normal pulses noted. Extremities:  Without clubbing or edema.  Impression/Plan:  ***     Notice: This dictation was prepared with Dragon dictation along with smaller phrase technology. Any transcriptional errors that result from this process are unintentional and may not be corrected upon review.

## 2022-05-12 NOTE — Discharge Instructions (Addendum)
EGD Discharge instructions Please read the instructions outlined below and refer to this sheet in the next few weeks. These discharge instructions provide you with general information on caring for yourself after you leave the hospital. Your doctor may also give you specific instructions. While your treatment has been planned according to the most current medical practices available, unavoidable complications occasionally occur. If you have any problems or questions after discharge, please call your doctor. ACTIVITY You may resume your regular activity but move at a slower pace for the next 24 hours.  Take frequent rest periods for the next 24 hours.  Walking will help expel (get rid of) the air and reduce the bloated feeling in your abdomen.  No driving for 24 hours (because of the anesthesia (medicine) used during the test).  You may shower.  Do not sign any important legal documents or operate any machinery for 24 hours (because of the anesthesia used during the test).  NUTRITION Drink plenty of fluids.  You may resume your normal diet.  Begin with a light meal and progress to your normal diet.  Avoid alcoholic beverages for 24 hours or as instructed by your caregiver.  MEDICATIONS You may resume your normal medications unless your caregiver tells you otherwise.  WHAT YOU CAN EXPECT TODAY You may experience abdominal discomfort such as a feeling of fullness or "gas" pains.  FOLLOW-UP Your doctor will discuss the results of your test with you.  SEEK IMMEDIATE MEDICAL ATTENTION IF ANY OF THE FOLLOWING OCCUR: Excessive nausea (feeling sick to your stomach) and/or vomiting.  Severe abdominal pain and distention (swelling).  Trouble swallowing.  Temperature over 101 F (37.8 C).  Rectal bleeding or vomiting of blood.     You underwent a nice esophageal dilation today.  I did take samples of your esophagus.  Further recommendations to follow once I get the laboratory result  back  Office visit with [place] in 6 to 8 weeks-Courtney my own  Patient request, I called Johnathan Hausen at [phone] -rolled to voicemail.  Left a message.

## 2022-05-12 NOTE — Op Note (Signed)
[facility] Patient Name: [name] Procedure Date: 05/12/2022 7:59 AM MRN: [id number] Date of Birth: 09-30-1960 Attending MD: Gennette Pac , MD, [id number] CSN: [id number] Age: 62 Admit Type: Outpatient Procedure:                Upper GI endoscopy Indications:              Dysphagia Providers:                Gennette Pac, MD, Sheran Fava,                            Pandora Leiter, Technician Referring MD:              Medicines:                Propofol per Anesthesia Complications:            No immediate complications. Estimated Blood Loss:     Estimated blood loss was minimal. Estimated blood                            loss was minimal. Procedure:                Pre-Anesthesia Assessment:                           - Prior to the procedure, a History and Physical                            was performed, and patient medications and                            allergies were reviewed. The patient's tolerance of                            previous anesthesia was also reviewed. The risks                            and benefits of the procedure and the sedation                            options and risks were discussed with the patient.                            All questions were answered, and informed consent                            was obtained. Prior Anticoagulants: The patient has                            taken no anticoagulant or antiplatelet agents. ASA                            Grade Assessment: III - A patient with severe  systemic disease. After reviewing the risks and                            benefits, the patient was deemed in satisfactory                            condition to undergo the procedure.                           After obtaining informed consent, the endoscope was                            passed under direct vision. Throughout the                            procedure, the patient's blood pressure,  pulse, and                            oxygen saturations were monitored continuously. The                            [id number]-[id number] ([id number]) scope was introduced through the                            mouth, and advanced to the second part of duodenum.                            The upper GI endoscopy was accomplished without                            difficulty. The patient tolerated the procedure                            well. Scope In: 8:41:05 AM Scope Out: 8:51:36 AM Total Procedure Duration: 0 hours 10 minutes 31 seconds  Findings:      The examined esophagus was normal.      The entire examined stomach was normal.      The duodenal bulb and second portion of the duodenum were normal. The       scope was withdrawn. Dilation was performed with a Maloney dilator with       mild resistance at 54 Fr. The scope was withdrawn. Dilation was       performed with a Maloney dilator with mild resistance at 56 Fr. The       dilation site was examined following endoscope reinsertion and showed no       change. Estimated blood loss was minimal. finally, the mid and distal       esophagus was biopsied for histologic study. A superficial tear noted       through the UES mucosa. Impression:               - Normal esophagus. Dilated and biopsy.. Query                            dilation of a cervical web occurred with passage of  a Maloney dilator.                           - Normal stomach.                           - Normal duodenal bulb and second portion of the                            duodenum. Moderate Sedation:      Moderate (conscious) sedation was personally administered by an       anesthesia professional. The following parameters were monitored: oxygen       saturation, heart rate, blood pressure, respiratory rate, EKG, adequacy       of pulmonary ventilation, and response to care. Recommendation:           - Patient has a contact number available for                             emergencies. The signs and symptoms of potential                            delayed complications were discussed with the                            patient. Return to normal activities tomorrow.                            Written discharge instructions were provided to the                            patient.                           - Advance diet as tolerated.                           - Continue present medications. Follow-up on                            pathology.                           - Return to my office in 6 weeks. Procedure Code(s):        --- Professional ---                           [phone], Esophagogastroduodenoscopy, flexible,                            transoral; diagnostic, including collection of                            specimen(s) by brushing or washing, when performed                            (separate procedure)  43450, Dilation of esophagus, by unguided sound or                            bougie, single or multiple passes Diagnosis Code(s):        --- Professional ---                           R13.10, Dysphagia, unspecified CPT copyright 2022 American Medical Association. All rights reserved. The codes documented in this report are preliminary and upon coder review may  be revised to meet current compliance requirements. Gerrit Friends. Ly Bacchi, MD Gennette Pac, MD 05/12/2022 10:38:28 AM This report has been signed electronically. Number of Addenda: 0

## 2022-05-12 NOTE — Anesthesia Preprocedure Evaluation (Addendum)
Anesthesia Evaluation  Patient identified by MRN, date of birth, ID band Patient awake    Reviewed: Allergy & Precautions, H&P , NPO status , Patient's Chart, lab work & pertinent test results  Airway Mallampati: II  TM Distance: >3 FB Neck ROM: Full    Dental  (+) Dental Advisory Given, Missing   Pulmonary shortness of breath and with exertion, asthma , sleep apnea , COPD, former smoker   Pulmonary exam normal breath sounds clear to auscultation       Cardiovascular hypertension, Pt. on medications + DOE  + Valvular Problems/Murmurs AI  Rhythm:Regular Rate:Normal + Diastolic murmurs Antoine Poche, MD 05/05/2020  3:46 PM EDT   Echo shows heart pumping function remains normal. Her aortic valve leak has progresed to moderate, still to to a degree of concern and just something to continue to monitor with echo every few years     Dominga Ferry MD     Neuro/Psych  PSYCHIATRIC DISORDERS Anxiety Depression     Neuromuscular disease    GI/Hepatic Neg liver ROS,GERD  Medicated,,  Endo/Other  negative endocrine ROS    Renal/GU Renal disease  negative genitourinary   Musculoskeletal  (+) Arthritis , Osteoarthritis,    Abdominal   Peds negative pediatric ROS (+)  Hematology  (+) Blood dyscrasia, anemia   Anesthesia Other Findings   Reproductive/Obstetrics negative OB ROS                             Anesthesia Physical Anesthesia Plan  ASA: 3  Anesthesia Plan: General   Post-op Pain Management: Minimal or no pain anticipated   Induction: Intravenous  PONV Risk Score and Plan: 0 and Propofol infusion  Airway Management Planned: Nasal Cannula and Natural Airway  Additional Equipment:   Intra-op Plan:   Post-operative Plan:   Informed Consent: I have reviewed the patients History and Physical, chart, labs and discussed the procedure including the risks, benefits and alternatives for the  proposed anesthesia with the patient or authorized representative who has indicated his/her understanding and acceptance.     Dental advisory given  Plan Discussed with: CRNA and Surgeon  Anesthesia Plan Comments:         Anesthesia Quick Evaluation

## 2022-05-12 NOTE — H&P (Signed)
@LOGO @   Primary Care Physician:  Cora Collum, DO Primary Gastroenterologist:  Dr. Jena Gauss  Pre-Procedure History & Physical: HPI:  [name] is a 62 y.o. female here for further evaluation of esophageal dysphagia history of Schatzki's ring dilated multiple times.  For further evaluation and intervention.  Past Medical History:  Diagnosis Date   Anxiety    Aortic insufficiency    a. 10/2017 Echo: Mod AI; b. 04/2020 Echo: Mod AI.   Asthma    every now and then.  Wheezing and coughing   Cancer 1997   left lung   Chronic abdominal pain    Chronic back pain    Chronic joint pain    Depression    Diastolic dysfunction    a. 10/2017 Echo: EF 60-65%, GrII DD, mod AI; b. 04/2020 Echo: EF 60-65%, no rwma, GrI DD, nl RV fxn, mod AI.   Essential hypertension    HLD (hyperlipidemia)    Palpitations 01/2013   a. 11/2017 Holter: Occas PACs, rare PVCs. Rare nonsustained atrial tachycardia; b. 01/2018 Event monitor: Predominantly sinus rhythm @ 90 (64-135). No significant arrhythmias.   Shortness of breath dyspnea    with exertion    Past Surgical History:  Procedure Laterality Date   ABDOMINAL HYSTERECTOMY     ABDOMINAL SURGERY     ovarian cyst removal   BALLOON DILATION N/A 02/27/2021   Procedure: BALLOON DILATION;  Surgeon: [name], DO;  [facility];  Service: Endoscopy;  Laterality: N/A;   BIOPSY N/A 11/22/2013   Procedure: GASTRIC BIOPSY;  Surgeon: [name], MD;  Location: [facility];  Service: Endoscopy;  Laterality: N/A;   BIOPSY  02/27/2021   Procedure: BIOPSY;  Surgeon: [name], DO;  [facility];  Service: Endoscopy;;   BLADDER SURGERY  suspension x4   x 4   CARPAL TUNNEL RELEASE Right 07/11/2015   Procedure: RIGHT CARPAL TUNNEL RELEASE;  Surgeon: Kerrin Champagne, MD;  Location: [facility];  Service: Orthopedics;  Laterality: Right;   CHOLECYSTECTOMY N/A 04/05/2014   Procedure: LAPAROSCOPIC CHOLECYSTECTOMY;   Surgeon: Franky Macho Md, MD;  Location: [facility];  Service: General;  Laterality: N/A;   COCCYGECTOMY N/A 04/11/2015   Procedure: COCCYGECTOMY WITH OSTEOTOMY THROUGH AREA OF DEFORMITY;  Surgeon: Kerrin Champagne, MD;  Location: [facility];  Service: Orthopedics;  Laterality: N/A;   COLONOSCOPY     COLONOSCOPY WITH PROPOFOL N/A 11/22/2013   Dr. Jena Gauss: Grade 3 and 4/internal hemorrhoids?"likely source of hematochezia Normal colonoscopy otherwise.    COLONOSCOPY WITH PROPOFOL N/A 09/15/2017   Dr. Lovena Neighbours: Hemorrhoids, next colonoscopy 10 years   ESOPHAGOGASTRODUODENOSCOPY (EGD) WITH PROPOFOL N/A 11/22/2013   Dr. Jena Gauss: Incomplete Schatzki's ring dilated and disrupted as described above.  Small hiatal hernia. Focally abnormal gastric mucosa of uncertain significance status post biopsy, reactive gastropathy, negative H.pylori   ESOPHAGOGASTRODUODENOSCOPY (EGD) WITH PROPOFOL N/A 08/04/2015   Dr. Jena Gauss: mild Schatzki's ring s/p dilation, small hiatal hernia    ESOPHAGOGASTRODUODENOSCOPY (EGD) WITH PROPOFOL N/A 03/17/2017   Mild Schatki's ring s/p dilation, small hiatal hernia, otherwise normal   ESOPHAGOGASTRODUODENOSCOPY (EGD) WITH PROPOFOL N/A 09/18/2018   Dr. Jena Gauss: Schatzki ring status post dilation and disruption.  Hiatal hernia.   ESOPHAGOGASTRODUODENOSCOPY (EGD) WITH PROPOFOL N/A 12/20/2019   non-obstructing Schatzki's ring s/p dilation with 56, 58, and 60 F.   ESOPHAGOGASTRODUODENOSCOPY (EGD) WITH PROPOFOL N/A 02/27/2021   Procedure: ESOPHAGOGASTRODUODENOSCOPY (EGD) WITH PROPOFOL;  Surgeon: [name], DO;  [facility];  Service: Endoscopy;  Laterality: N/A;  11:30am   EXCISION VAGINAL CYST Left 06/20/2012   Procedure: EXCISION LEFT LABIAL CYST;  Surgeon: [name], MD;  Location: [facility];  Service: Gynecology;  Laterality: Left;   LOBECTOMY Left    LUNG CANCER SURGERY  1997   age 5, resection only   MALONEY DILATION N/A 11/22/2013   Procedure: MALONEY DILATION;  Surgeon:  [name], MD;  Location: [facility];  Service: Endoscopy;  Laterality: N/A;  56   MALONEY DILATION N/A 08/04/2015   Procedure: Elease Hashimoto DILATION;  Surgeon: [name], MD;  [facility];  Service: Endoscopy;  Laterality: N/A;   MALONEY DILATION N/A 03/17/2017   Procedure: Elease Hashimoto DILATION;  Surgeon: [name], MD;  [facility];  Service: Endoscopy;  Laterality: N/A;   MALONEY DILATION N/A 09/18/2018   Procedure: Elease Hashimoto DILATION;  Surgeon: [name], MD;  [facility];  Service: Endoscopy;  Laterality: N/A;   MALONEY DILATION N/A 12/20/2019   Procedure: Elease Hashimoto DILATION;  Surgeon: [name], MD;  [facility];  Service: Endoscopy;  Laterality: N/A;   MASS EXCISION N/A 07/23/2014   Procedure: EXCISIONAL BIOPSY NODULE LEFT PARACOCCYGEAL AREA;  Surgeon: Kerrin Champagne, MD;  Location: [facility];  Service: Orthopedics;  Laterality: N/A;   MASS EXCISION Left 10/10/2015   Procedure: EXCISIONAL BIOPSY OF LEFT DORSORADIAL FOREARM MASS LIPOMA;  Surgeon: Kerrin Champagne, MD;  Location:  [facility];  Service: Orthopedics;  Laterality: Left;   TUBAL LIGATION      Prior to Admission medications   Medication Sig Start Date End Date Taking? Authorizing Provider  acyclovir (ZOVIRAX) 400 MG tablet Take 1 tablet by mouth twice daily 02/22/22  Yes Paige, Victoria J, DO  amitriptyline (ELAVIL) 150 MG tablet Take 1 tablet by mouth once daily 04/21/22  Yes Paige, Victoria J, DO  amLODipine (NORVASC) 10 MG tablet Take 1 tablet (10 mg total) by mouth daily. 05/03/22 05/03/23 Yes Creig Hines, NP  atorvastatin (LIPITOR) 40 MG tablet Take 1 tablet (40 mg total) by mouth daily. [facility] March 2024. 03/17/22  Yes Fayette Pho, MD  budesonide-formoterol [facility]) 160-4.5 MCG/ACT inhaler Inhale 2 puffs into the lungs 2 (two) times daily. Patient taking differently: Inhale 2 puffs into the  lungs 2 (two) times daily as needed (shortness of breath). 01/22/22  Yes Paige, Lucas Mallow, DO  celecoxib (CELEBREX) 200 MG capsule Take 1 capsule by mouth once daily 03/29/22  Yes Paige, Victoria J, DO  DULoxetine (CYMBALTA) 60 MG capsule Take 1 capsule by mouth twice daily 01/26/22  Yes Paige, Victoria J, DO  esomeprazole (NEXIUM) 40 MG capsule TAKE 1 CAPSULE BY MOUTH ONCE DAILY AT NOON 05/04/22  Yes Mahon, Frederik Schmidt, NP  furosemide (LASIX) 20 MG tablet Take 1 tablet (20 mg total) by mouth daily as needed for edema. 03/25/22  Yes Creig Hines, NP  linaclotide [facility]) 290 MCG CAPS capsule TAKE 1 CAPSULE BY MOUTH ONCE DAILY BEFORE BREAKFAST 04/05/22  Yes Mahon, Courtney L, NP  pregabalin (LYRICA) 100 MG capsule TAKE 1 CAPSULE BY MOUTH THREE TIMES DAILY Patient taking differently: Take 100 mg by mouth 2 (two) times daily. 04/13/22  Yes Paige, Turkey J, DO  terbinafine (LAMISIL) 1 % cream Apply 1 application. topically 2 (two) times daily. 05/18/21  Yes Paige, Victoria J, DO  buPROPion (WELLBUTRIN XL) 150 MG 24 hr tablet  Take 1 tablet (150 mg total) by mouth daily. Patient not taking: Reported on 05/05/2022 03/05/22   Cora Collum, DO    Allergies as of 04/05/2022 - Review Complete 04/05/2022  Allergen Reaction Noted   Promethazine hcl Nausea And Vomiting and Other (See Comments) 07/26/2008    Family History  Problem Relation Age of Onset   Diabetes Other    Heart disease Other        has pace maker   Hypertension Mother    Kidney disease Mother    Hypertension Father    Kidney disease Father    Heart disease Father    Cancer Father        leukemia   Hypertension Sister    Hypertension Sister    Colon cancer Neg Hx     Social History   Socioeconomic History   Marital status: Married    Spouse name: Carlo   Number of children: 3   Years of education: Not on file   Highest education level: Not on file  Occupational History   Occupation: Disability  Tobacco Use    Smoking status: Former    Packs/day: 1.50    Years: 25.00    Additional pack years: 0.00    Total pack years: 37.50    Types: Cigarettes    Quit date: 04/07/1995    Years since quitting: 27.1   Smokeless tobacco: Never  Vaping Use   Vaping Use: Never used  Substance and Sexual Activity   Alcohol use: No   Drug use: No   Sexual activity: Not Currently    Birth control/protection: Surgical  Other Topics Concern   Not on file  Social History Narrative   Not on file   Social Determinants of Health   Financial Resource Strain: Low Risk  (04/13/2022)   Overall Financial Resource Strain (CARDIA)    Difficulty of Paying Living Expenses: Not hard at all  Food Insecurity: No Food Insecurity (04/13/2022)   Hunger Vital Sign    Worried About Running Out of Food in the Last Year: Never true    Ran Out of Food in the Last Year: Never true  Transportation Needs: No Transportation Needs (04/13/2022)   PRAPARE - Administrator, Civil Service (Medical): No    Lack of Transportation (Non-Medical): No  Physical Activity: Insufficiently Active (04/21/2022)   Exercise Vital Sign    Days of Exercise per Week: 3 days    Minutes of Exercise per Session: 10 min  Stress: Stress Concern Present (04/21/2022)   [employer] of Occupational Health - Occupational Stress Questionnaire    Feeling of Stress : To some extent  Social Connections: Moderately Isolated (04/13/2022)   Social Connection and Isolation Panel [NHANES]    Frequency of Communication with Friends and Family: More than three times a week    Frequency of Social Gatherings with Friends and Family: Three times a week    Attends Religious Services: Never    Active Member of Clubs or Organizations: No    Attends Banker Meetings: Never    Marital Status: Married  Catering manager Violence: Not At Risk (04/13/2022)   Humiliation, Afraid, Rape, and Kick questionnaire    Fear of Current or Ex-Partner: No    Emotionally  Abused: No    Physically Abused: No    Sexually Abused: No    Review of Systems: See HPI, otherwise negative ROS  Physical Exam: BP (!) 150/60 (BP Location: Right Arm)  Pulse 84   Temp 98.5 F (36.9 C)   Resp 18   Ht 5\' 4"  (1.626 m)   Wt 78.9 kg   SpO2 98%   BMI 29.86 kg/m  General:   Alert,  Well-developed, well-nourished, pleasant and cooperative in NAD Lungs:  Clear throughout to auscultation.   No wheezes, crackles, or rhonchi. No acute distress. Heart:  Regular rate and rhythm; no murmurs, clicks, rubs,  or gallops. Abdomen: Non-distended, normal bowel sounds.  Soft and nontender without appreciable mass or hepatosplenomegaly.  Pulses:  Normal pulses noted. Extremities:  Without clubbing or edema.  Impression/Plan: 62 year old lady with recurrent esophageal dysphagia.  Here for EGD with possible esophageal dilation. The risks, benefits, limitations, alternatives and imponderables have been reviewed with the patient. Potential for esophageal dilation, biopsy, etc. have also been reviewed.  Questions have been answered. All parties agreeable.      Notice: This dictation was prepared with Dragon dictation along with smaller phrase technology. Any transcriptional errors that result from this process are unintentional and may not be corrected upon review.

## 2022-05-12 NOTE — Anesthesia Postprocedure Evaluation (Signed)
Anesthesia Post Note  Patient: Judi Cong  Procedure(s) Performed: ESOPHAGOGASTRODUODENOSCOPY (EGD) WITH PROPOFOL MALONEY DILATION BIOPSY  Patient location during evaluation: Phase II Anesthesia Type: General Level of consciousness: awake and alert and oriented Pain management: pain level controlled Vital Signs Assessment: post-procedure vital signs reviewed and stable Respiratory status: spontaneous breathing, nonlabored ventilation and respiratory function stable Cardiovascular status: blood pressure returned to baseline and stable Postop Assessment: no apparent nausea or vomiting Anesthetic complications: no  No notable events documented.   Last Vitals:  Vitals:   05/12/22 0630 05/12/22 0855  BP: (!) 150/60 (!) 115/52  Pulse: 84 88  Resp: 18 18  Temp: 36.9 C 37 C  SpO2: 98% 96%    Last Pain:  Vitals:   05/12/22 0900  TempSrc:   PainSc: 0-No pain                 Jerusalem Wert C Kiondra Caicedo

## 2022-05-13 ENCOUNTER — Encounter: Payer: Self-pay | Admitting: Internal Medicine

## 2022-05-13 LAB — SURGICAL PATHOLOGY

## 2022-05-17 ENCOUNTER — Encounter (HOSPITAL_COMMUNITY): Payer: Self-pay | Admitting: Internal Medicine

## 2022-05-18 NOTE — Anesthesia Preprocedure Evaluation (Signed)
Anesthesia Evaluation  Patient identified by MRN, date of birth, ID band Patient awake    Reviewed: Allergy & Precautions, H&P , NPO status , Patient's Chart, lab work & pertinent test results  Airway Mallampati: II  TM Distance: >3 FB Neck ROM: Full    Dental no notable dental hx. (+) Edentulous Upper, Missing,    Pulmonary asthma , sleep apnea , COPD, former smoker   Pulmonary exam normal breath sounds clear to auscultation       Cardiovascular hypertension, Normal cardiovascular exam Rhythm:Regular Rate:Normal     Neuro/Psych  Neuromuscular disease negative neurological ROS  negative psych ROS   GI/Hepatic negative GI ROS, Neg liver ROS,GERD  ,,  Endo/Other  negative endocrine ROS    Renal/GU Renal disease  negative genitourinary   Musculoskeletal  (+) Arthritis ,    Abdominal   Peds negative pediatric ROS (+)  Hematology negative hematology ROS (+)   Anesthesia Other Findings All: promethazine  Reproductive/Obstetrics negative OB ROS                             Anesthesia Physical Anesthesia Plan  ASA: 3  Anesthesia Plan: General   Post-op Pain Management: Precedex and Tylenol PO (pre-op)*   Induction: Intravenous  PONV Risk Score and Plan: Treatment may vary due to age or medical condition, Midazolam, Propofol infusion, Ondansetron and TIVA  Airway Management Planned: LMA  Additional Equipment: None  Intra-op Plan:   Post-operative Plan:   Informed Consent: I have reviewed the patients History and Physical, chart, labs and discussed the procedure including the risks, benefits and alternatives for the proposed anesthesia with the patient or authorized representative who has indicated his/her understanding and acceptance.     Dental advisory given  Plan Discussed with:   Anesthesia Plan Comments: (LMA TIVA)       Anesthesia Quick Evaluation

## 2022-05-19 ENCOUNTER — Ambulatory Visit (HOSPITAL_BASED_OUTPATIENT_CLINIC_OR_DEPARTMENT_OTHER): Payer: Medicare PPO | Admitting: Anesthesiology

## 2022-05-19 ENCOUNTER — Ambulatory Visit (HOSPITAL_BASED_OUTPATIENT_CLINIC_OR_DEPARTMENT_OTHER)
Admission: RE | Admit: 2022-05-19 | Discharge: 2022-05-19 | Disposition: A | Discharge status: Home / Self Care | Payer: Medicare PPO | Attending: Orthopedic Surgery | Admitting: Orthopedic Surgery

## 2022-05-19 ENCOUNTER — Encounter (HOSPITAL_BASED_OUTPATIENT_CLINIC_OR_DEPARTMENT_OTHER): Payer: Self-pay | Admitting: Orthopedic Surgery

## 2022-05-19 ENCOUNTER — Encounter (HOSPITAL_BASED_OUTPATIENT_CLINIC_OR_DEPARTMENT_OTHER)
Admission: RE | Disposition: A | Discharge status: Home / Self Care | Payer: Self-pay | Source: Home / Self Care | Attending: Orthopedic Surgery

## 2022-05-19 ENCOUNTER — Other Ambulatory Visit: Payer: Self-pay

## 2022-05-19 DIAGNOSIS — G4730 Sleep apnea, unspecified: Secondary | ICD-10-CM | POA: Insufficient documentation

## 2022-05-19 DIAGNOSIS — G5602 Carpal tunnel syndrome, left upper limb: Secondary | ICD-10-CM

## 2022-05-19 DIAGNOSIS — Z01818 Encounter for other preprocedural examination: Secondary | ICD-10-CM

## 2022-05-19 DIAGNOSIS — M67432 Ganglion, left wrist: Secondary | ICD-10-CM | POA: Insufficient documentation

## 2022-05-19 DIAGNOSIS — Z87891 Personal history of nicotine dependence: Secondary | ICD-10-CM | POA: Diagnosis not present

## 2022-05-19 DIAGNOSIS — I10 Essential (primary) hypertension: Secondary | ICD-10-CM | POA: Insufficient documentation

## 2022-05-19 DIAGNOSIS — K219 Gastro-esophageal reflux disease without esophagitis: Secondary | ICD-10-CM | POA: Insufficient documentation

## 2022-05-19 DIAGNOSIS — M67442 Ganglion, left hand: Secondary | ICD-10-CM | POA: Diagnosis not present

## 2022-05-19 DIAGNOSIS — M1990 Unspecified osteoarthritis, unspecified site: Secondary | ICD-10-CM | POA: Insufficient documentation

## 2022-05-19 DIAGNOSIS — J449 Chronic obstructive pulmonary disease, unspecified: Secondary | ICD-10-CM

## 2022-05-19 HISTORY — PX: CARPAL TUNNEL RELEASE: SHX101

## 2022-05-19 HISTORY — PX: GANGLION CYST EXCISION: SHX1691

## 2022-05-19 SURGERY — CARPAL TUNNEL RELEASE
Anesthesia: General | Site: Wrist | Laterality: Left

## 2022-05-19 MED ORDER — FENTANYL CITRATE (PF) 100 MCG/2ML IJ SOLN
INTRAMUSCULAR | Status: AC
  Filled 2022-05-19: qty 2

## 2022-05-19 MED ORDER — OXYCODONE HCL 5 MG PO TABS
ORAL_TABLET | ORAL | Status: AC
  Filled 2022-05-19: qty 1

## 2022-05-19 MED ORDER — ONDANSETRON HCL 4 MG/2ML IJ SOLN: 4.0000 mg | Freq: Once | INTRAMUSCULAR | Status: DC | PRN

## 2022-05-19 MED ORDER — OXYCODONE HCL 5 MG PO TABS
5.0000 mg | ORAL_TABLET | Freq: Once | ORAL | Status: AC
  Administered 2022-05-19: 5 mg via ORAL

## 2022-05-19 MED ORDER — OXYCODONE HCL 5 MG PO TABS: 5.0000 mg | ORAL_TABLET | Freq: Four times a day (QID) | ORAL | 0 refills | Status: AC | PRN

## 2022-05-19 MED ORDER — FENTANYL CITRATE (PF) 100 MCG/2ML IJ SOLN: 100.0000 ug | Freq: Once | INTRAMUSCULAR | Status: DC

## 2022-05-19 MED ORDER — ONDANSETRON HCL 4 MG/2ML IJ SOLN
INTRAMUSCULAR | Status: DC | PRN
  Administered 2022-05-19: 4 mg via INTRAVENOUS

## 2022-05-19 MED ORDER — FENTANYL CITRATE (PF) 100 MCG/2ML IJ SOLN
25.0000 ug | INTRAMUSCULAR | Status: DC | PRN
  Administered 2022-05-19 (×3): 50 ug via INTRAVENOUS

## 2022-05-19 MED ORDER — 0.9 % SODIUM CHLORIDE (POUR BTL) OPTIME
TOPICAL | Status: DC | PRN
  Administered 2022-05-19: 1000 mL

## 2022-05-19 MED ORDER — LACTATED RINGERS IV SOLN: INTRAVENOUS | Status: DC

## 2022-05-19 MED ORDER — MIDAZOLAM HCL 2 MG/2ML IJ SOLN
INTRAMUSCULAR | Status: AC
  Filled 2022-05-19: qty 2

## 2022-05-19 MED ORDER — FENTANYL CITRATE (PF) 250 MCG/5ML IJ SOLN
INTRAMUSCULAR | Status: DC | PRN
  Administered 2022-05-19: 25 ug via INTRAVENOUS
  Administered 2022-05-19: 50 ug via INTRAVENOUS
  Administered 2022-05-19: 25 ug via INTRAVENOUS

## 2022-05-19 MED ORDER — LIDOCAINE 2% (20 MG/ML) 5 ML SYRINGE
INTRAMUSCULAR | Status: AC
  Filled 2022-05-19: qty 5

## 2022-05-19 MED ORDER — MIDAZOLAM HCL 2 MG/2ML IJ SOLN
2.0000 mg | Freq: Once | INTRAMUSCULAR | Status: AC
  Administered 2022-05-19: 2 mg via INTRAVENOUS

## 2022-05-19 MED ORDER — DEXAMETHASONE SODIUM PHOSPHATE 10 MG/ML IJ SOLN
INTRAMUSCULAR | Status: DC | PRN
  Administered 2022-05-19: 4 mg via INTRAVENOUS

## 2022-05-19 MED ORDER — LIDOCAINE 2% (20 MG/ML) 5 ML SYRINGE
INTRAMUSCULAR | Status: DC | PRN
  Administered 2022-05-19: 100 mg via INTRAVENOUS

## 2022-05-19 MED ORDER — DEXAMETHASONE SODIUM PHOSPHATE 10 MG/ML IJ SOLN
INTRAMUSCULAR | Status: AC
  Filled 2022-05-19: qty 1

## 2022-05-19 MED ORDER — ACETAMINOPHEN 10 MG/ML IV SOLN: 1000.0000 mg | Freq: Once | INTRAVENOUS | Status: DC | PRN

## 2022-05-19 MED ORDER — ONDANSETRON HCL 4 MG/2ML IJ SOLN
INTRAMUSCULAR | Status: AC
  Filled 2022-05-19: qty 2

## 2022-05-19 MED ORDER — PROPOFOL 500 MG/50ML IV EMUL
INTRAVENOUS | Status: DC | PRN
  Administered 2022-05-19: 200 ug/kg/min via INTRAVENOUS

## 2022-05-19 MED ORDER — PROPOFOL 10 MG/ML IV BOLUS
INTRAVENOUS | Status: DC | PRN
  Administered 2022-05-19: 160 mg via INTRAVENOUS

## 2022-05-19 SURGICAL SUPPLY — 40 items
APL PRP STRL LF DISP 70% ISPRP (MISCELLANEOUS) ×2
BLADE SURG 15 STRL LF DISP TIS (BLADE) ×2 IMPLANT
BLADE SURG 15 STRL SS (BLADE) ×2
BNDG CMPR 5X3 KNIT ELC UNQ LF (GAUZE/BANDAGES/DRESSINGS) ×2
BNDG CMPR 9X4 STRL LF SNTH (GAUZE/BANDAGES/DRESSINGS) ×2
BNDG ELASTIC 3INX 5YD STR LF (GAUZE/BANDAGES/DRESSINGS) ×2 IMPLANT
BNDG ESMARK 4X9 LF (GAUZE/BANDAGES/DRESSINGS) ×2 IMPLANT
BNDG GAUZE DERMACEA FLUFF 4 (GAUZE/BANDAGES/DRESSINGS) ×2 IMPLANT
BNDG GZE DERMACEA 4 6PLY (GAUZE/BANDAGES/DRESSINGS) ×2
BNDG PLASTER X FAST 3X3 WHT LF (CAST SUPPLIES) IMPLANT
BNDG PLSTR 9X3 FST ST WHT (CAST SUPPLIES)
CHLORAPREP W/TINT 26 (MISCELLANEOUS) ×2 IMPLANT
CORD BIPOLAR FORCEPS 12FT (ELECTRODE) ×2 IMPLANT
COVER BACK TABLE 60X90IN (DRAPES) ×2 IMPLANT
CUFF TOURN SGL QUICK 18X4 (TOURNIQUET CUFF) ×2 IMPLANT
CUFF TOURN SGL QUICK 24 (TOURNIQUET CUFF)
CUFF TRNQT CYL 24X4X16.5-23 (TOURNIQUET CUFF) IMPLANT
DRAPE EXTREMITY T 121X128X90 (DISPOSABLE) ×2 IMPLANT
DRAPE SURG 17X23 STRL (DRAPES) ×2 IMPLANT
GAUZE 4X4 16PLY ~~LOC~~+RFID DBL (SPONGE) IMPLANT
GAUZE XEROFORM 1X8 LF (GAUZE/BANDAGES/DRESSINGS) ×2 IMPLANT
GLOVE BIO SURGEON STRL SZ7 (GLOVE) ×2 IMPLANT
GLOVE BIOGEL PI IND STRL 7.0 (GLOVE) ×2 IMPLANT
GOWN STRL REUS W/ TWL LRG LVL3 (GOWN DISPOSABLE) ×4 IMPLANT
GOWN STRL REUS W/TWL LRG LVL3 (GOWN DISPOSABLE) ×4
NDL HYPO 25X1 1.5 SAFETY (NEEDLE) ×2 IMPLANT
NEEDLE HYPO 25X1 1.5 SAFETY (NEEDLE) ×2 IMPLANT
NS IRRIG 1000ML POUR BTL (IV SOLUTION) ×2 IMPLANT
PACK BASIN DAY SURGERY FS (CUSTOM PROCEDURE TRAY) ×2 IMPLANT
PAD CAST 3X4 CTTN HI CHSV (CAST SUPPLIES) IMPLANT
PADDING CAST COTTON 3X4 STRL (CAST SUPPLIES)
SHEET MEDIUM DRAPE 40X70 STRL (DRAPES) ×2 IMPLANT
SLEEVE SCD COMPRESS KNEE MED (STOCKING) IMPLANT
SUT ETHILON 4 0 PS 2 18 (SUTURE) ×2 IMPLANT
SUT MNCRL AB 3-0 PS2 18 (SUTURE) ×2 IMPLANT
SUT VIC AB 4-0 PS2 18 (SUTURE) IMPLANT
SYR BULB EAR ULCER 3OZ GRN STR (SYRINGE) ×2 IMPLANT
SYR CONTROL 10ML LL (SYRINGE) ×2 IMPLANT
TOWEL GREEN STERILE FF (TOWEL DISPOSABLE) ×4 IMPLANT
UNDERPAD 30X36 HEAVY ABSORB (UNDERPADS AND DIAPERS) ×2 IMPLANT

## 2022-05-19 NOTE — Discharge Instructions (Addendum)
Post Anesthesia Home Care Instructions  Activity: Get plenty of rest for the remainder of the day. A responsible individual must stay with you for 24 hours following the procedure.  For the next 24 hours, DO NOT: -Drive a car -Advertising copywriter -Drink alcoholic beverages -Take any medication unless instructed by your physician -Make any legal decisions or sign important papers.  Meals: Start with liquid foods such as gelatin or soup. Progress to regular foods as tolerated. Avoid greasy, spicy, heavy foods. If nausea and/or vomiting occur, drink only clear liquids until the nausea and/or vomiting subsides. Call your physician if vomiting continues.  Special Instructions/Symptoms: Your throat may feel dry or sore from the anesthesia or the breathing tube placed in your throat during surgery. If this causes discomfort, gargle with warm salt water. The discomfort should disappear within 24 hours.  If you had a scopolamine patch placed behind your ear for the management of post- operative nausea and/or vomiting:  1. The medication in the patch is effective for 72 hours, after which it should be removed.  Wrap patch in a tissue and discard in the trash. Wash hands thoroughly with soap and water. 2. You may remove the patch earlier than 72 hours if you experience unpleasant side effects which may include dry mouth, dizziness or visual disturbances. 3. Avoid touching the patch. Wash your hands with soap and water after contact with the patch.    Waylan Rocher, M.D. Hand Surgery  POST-OPERATIVE DISCHARGE INSTRUCTIONS   PRESCRIPTIONS: - You may have been given a prescription to be taken as directed for post-operative pain control.  You may also take over the counter ibuprofen/aleve and tylenol for pain. Take this as directed on the packaging. Do not exceed 3000 mg tylenol/acetaminophen in 24 hours.  Ibuprofen 600-800 mg (3-4) tablets by mouth every 6 hours as needed for pain.    OR  Aleve 2 tablets by mouth every 12 hours (twice daily) as needed for pain.   AND/OR  Tylenol 1000 mg (2 tablets) every 8 hours as needed for pain.  - Please use your pain medication carefully, as refills are limited and you may not be provided with one.  As stated above, please use over the counter pain medicine - it will also be helpful with decreasing your swelling.    ANESTHESIA: -After your surgery, post-surgical discomfort or pain is likely. This discomfort can last several days to a few weeks. At certain times of the day your discomfort may be more intense.   Did you receive a nerve block?   - A nerve block can provide pain relief for one hour to two days after your surgery. As long as the nerve block is working, you will experience little or no sensation in the area the surgeon operated on.  - As the nerve block wears off, you will begin to experience pain or discomfort. It is very important that you begin taking your prescribed pain medication before the nerve block fully wears off. Treating your pain at the first sign of the block wearing off will ensure your pain is better controlled and more tolerable when full-sensation returns. Do not wait until the pain is intolerable, as the medicine will be less effective. It is better to treat pain in advance than to try and catch up.   General Anesthesia:  If you did not receive a nerve block during your surgery, you will need to start taking your pain medication shortly after your surgery and should continue to  do so as prescribed by your surgeon.     ICE AND ELEVATION: - You may use ice for the first 48-72 hours, but it is not critical.   - Motion of your fingers is very important to decrease the swelling.  - Elevation, as much as possible for the next 48 hours, is critical for decreasing swelling as well as for pain relief. Elevation means when you are seated or lying down, you hand should be at or above your heart. When walking,  the hand needs to be at or above the level of your elbow.  - If the bandage gets too tight, it may need to be loosened. Please contact our office and we will instruct you in how to do this.    SURGICAL BANDAGES:  - Keep your dressing and/or splint clean and dry at all times.  You can remove your dressing 7 days from now and change with a dry dressing or Band-Aids as needed thereafter. - You may place a plastic bag over your bandage to shower, but be careful, do not get your bandages wet.  - After the bandages have been removed, it is OK to get the stitches wet in a shower or with hand washing. Do Not soak or submerge the wound yet. Please do not use lotions or creams on the stitches.      HAND THERAPY:  - You may not need any. If you do, we will begin this at your follow up visit in the clinic.    ACTIVITY AND WORK: - You are encouraged to move any fingers which are not in the bandage.  - Light use of the fingers is allowed to assist the other hand with daily hygiene and eating, but strong gripping or lifting is often uncomfortable and should be avoided.  - You might miss a variable period of time from work and hopefully this issue has been discussed prior to surgery. You may not do any heavy work with your affected hand for about 2 weeks.    [facility] Second Floor, [address] [place], [place] [address] [phone]      Oxycodone given at 12:15. Next dose of Oxycodone may be given at 6:15pm if needed.

## 2022-05-19 NOTE — Interval H&P Note (Signed)
History and Physical Interval Note:  05/19/2022 8:36 AM  [name]  has presented today for surgery, with the diagnosis of Right carpal tunnel syndrome, right volar carpal ganglion cyst.  The various methods of treatment have been discussed with the patient and family. After consideration of risks, benefits and other options for treatment, the patient has consented to  Procedure(s) with comments: CARPAL TUNNEL RELEASE (Left) - or MAC with regional 60 left volar carpal ganglion cyst excision (Left) - or MAC with regional 60 as a surgical intervention.  The patient's history has been reviewed, patient examined, no change in status, stable for surgery.  I have reviewed the patient's chart and labs.  Questions were answered to the patient's satisfaction.     Ajahni Nay Thurza Kwiecinski

## 2022-05-19 NOTE — H&P (Signed)
HAND SURGERY   HPI: Patient is a 62 y.o. female who presents with left carpal tunnel syndrome that was confirmed on EMG/nerve conduction study and has failed conservative management with activity modification and nocturnal bracing.  She also has a mass at the volar and radial aspect of the wrist that has enlarged over time and is quite bothersome to her.  The mass is limiting her wrist flexion secondary to pain..  Patient denies any changes to their medical history or new systemic symptoms today.    Past Medical History:  Diagnosis Date   Anxiety    Aortic insufficiency    a. 10/2017 Echo: Mod AI; b. 04/2020 Echo: Mod AI.   Asthma    every now and then.  Wheezing and coughing   Cancer (HCC) 1997   left lung   Chronic abdominal pain    Chronic back pain    Chronic joint pain    Depression    Diastolic dysfunction    a. 10/2017 Echo: EF 60-65%, GrII DD, mod AI; b. 04/2020 Echo: EF 60-65%, no rwma, GrI DD, nl RV fxn, mod AI.   Essential hypertension    GERD (gastroesophageal reflux disease)    HLD (hyperlipidemia)    Palpitations 01/2013   a. 11/2017 Holter: Occas PACs, rare PVCs. Rare nonsustained atrial tachycardia; b. 01/2018 Event monitor: Predominantly sinus rhythm @ 90 (64-135). No significant arrhythmias.   Shortness of breath dyspnea    with exertion   Past Surgical History:  Procedure Laterality Date   ABDOMINAL HYSTERECTOMY     ABDOMINAL SURGERY     ovarian cyst removal   BALLOON DILATION N/A 02/27/2021   Procedure: BALLOON DILATION;  Surgeon: [name], DO;  [facility];  Service: Endoscopy;  Laterality: N/A;   BIOPSY N/A 11/22/2013   Procedure: GASTRIC BIOPSY;  Surgeon: [name], MD;  Location: [facility];  Service: Endoscopy;  Laterality: N/A;   BIOPSY  02/27/2021   Procedure: BIOPSY;  Surgeon: [name], DO;  [facility];  Service: Endoscopy;;   BIOPSY  05/12/2022   Procedure: BIOPSY;  Surgeon: [name], MD;  [facility];  Service: Endoscopy;;   BLADDER SURGERY  suspension x4   x 4   CARPAL TUNNEL RELEASE Right 07/11/2015   Procedure: RIGHT CARPAL TUNNEL RELEASE;  Surgeon: Kerrin Champagne, MD;  Location: [facility];  Service: Orthopedics;  Laterality: Right;   CHOLECYSTECTOMY N/A 04/05/2014   Procedure: LAPAROSCOPIC CHOLECYSTECTOMY;  Surgeon: Franky Macho Md, MD;  Location: [facility];  Service: General;  Laterality: N/A;   COCCYGECTOMY N/A 04/11/2015   Procedure: COCCYGECTOMY WITH OSTEOTOMY THROUGH AREA OF DEFORMITY;  Surgeon: Kerrin Champagne, MD;  Location: [facility];  Service: Orthopedics;  Laterality: N/A;   COLONOSCOPY     COLONOSCOPY WITH PROPOFOL N/A 11/22/2013   Dr. Jena Gauss: Grade 3 and 4/internal hemorrhoids?"likely source of hematochezia Normal colonoscopy otherwise.    COLONOSCOPY WITH PROPOFOL N/A 09/15/2017   Dr. Lovena Neighbours: Hemorrhoids, next colonoscopy 10 years   ESOPHAGOGASTRODUODENOSCOPY (EGD) WITH PROPOFOL N/A 11/22/2013   Dr. Jena Gauss: Incomplete Schatzki's ring dilated and disrupted as described above.  Small hiatal hernia. Focally abnormal gastric mucosa of uncertain significance status post biopsy, reactive gastropathy, negative H.pylori   ESOPHAGOGASTRODUODENOSCOPY (EGD) WITH PROPOFOL N/A 08/04/2015   Dr. Jena Gauss: mild Schatzki's ring s/p dilation, small hiatal hernia    ESOPHAGOGASTRODUODENOSCOPY (EGD) WITH PROPOFOL N/A 03/17/2017   Mild Schatki's ring s/p dilation, small hiatal hernia, otherwise  normal   ESOPHAGOGASTRODUODENOSCOPY (EGD) WITH PROPOFOL N/A 09/18/2018   Dr. Jena Gauss: Schatzki ring status post dilation and disruption.  Hiatal hernia.   ESOPHAGOGASTRODUODENOSCOPY (EGD) WITH PROPOFOL N/A 12/20/2019   non-obstructing Schatzki's ring s/p dilation with 56, 58, and 60 F.   ESOPHAGOGASTRODUODENOSCOPY (EGD) WITH PROPOFOL N/A 02/27/2021   Procedure: ESOPHAGOGASTRODUODENOSCOPY (EGD) WITH PROPOFOL;  Surgeon: [name], DO;  [facility];  Service: Endoscopy;   Laterality: N/A;  11:30am   ESOPHAGOGASTRODUODENOSCOPY (EGD) WITH PROPOFOL N/A 05/12/2022   Procedure: ESOPHAGOGASTRODUODENOSCOPY (EGD) WITH PROPOFOL;  Surgeon: [name], MD;  [facility];  Service: Endoscopy;  Laterality: N/A;  8:30 am   EXCISION VAGINAL CYST Left 06/20/2012   Procedure: EXCISION LEFT LABIAL CYST;  Surgeon: [name], MD;  Location: [facility];  Service: Gynecology;  Laterality: Left;   LOBECTOMY Left    LUNG CANCER SURGERY  1997   age 89, resection only   MALONEY DILATION N/A 11/22/2013   Procedure: MALONEY DILATION;  Surgeon: [name], MD;  Location: [facility];  Service: Endoscopy;  Laterality: N/A;  56   MALONEY DILATION N/A 08/04/2015   Procedure: Elease Hashimoto DILATION;  Surgeon: [name], MD;  [facility];  Service: Endoscopy;  Laterality: N/A;   MALONEY DILATION N/A 03/17/2017   Procedure: Elease Hashimoto DILATION;  Surgeon: [name], MD;  [facility];  Service: Endoscopy;  Laterality: N/A;   MALONEY DILATION N/A 09/18/2018   Procedure: Elease Hashimoto DILATION;  Surgeon: [name], MD;  [facility];  Service: Endoscopy;  Laterality: N/A;   MALONEY DILATION N/A 12/20/2019   Procedure: Elease Hashimoto DILATION;  Surgeon: [name], MD;  [facility];  Service: Endoscopy;  Laterality: N/A;   MALONEY DILATION N/A 05/12/2022   Procedure: Elease Hashimoto DILATION;  Surgeon: [name], MD;  [facility];  Service: Endoscopy;  Laterality: N/A;   MASS EXCISION N/A 07/23/2014   Procedure: EXCISIONAL BIOPSY NODULE LEFT PARACOCCYGEAL AREA;  Surgeon: Kerrin Champagne, MD;  Location[facility];  Service: Orthopedics;  Laterality: N/A;   MASS EXCISION Left 10/10/2015   Procedure: EXCISIONAL BIOPSY OF LEFT DORSORADIAL FOREARM MASS LIPOMA;  Surgeon: Kerrin Champagne, MD;  Location: [facility];  Service: Orthopedics;  Laterality: Left;   TUBAL LIGATION     Social History   Socioeconomic History    Marital status: Married    Spouse name: Carlo   Number of children: 3   Years of education: Not on file   Highest education level: Not on file  Occupational History   Occupation: Disability  Tobacco Use   Smoking status: Former    Packs/day: 1.50    Years: 25.00    Additional pack years: 0.00    Total pack years: 37.50    Types: Cigarettes    Quit date: 04/07/1995    Years since quitting: 27.1   Smokeless tobacco: Never  Vaping Use   Vaping Use: Never used  Substance and Sexual Activity   Alcohol use: No   Drug use: No   Sexual activity: Not Currently    Birth control/protection: Surgical    Comment: hyst  Other Topics Concern   Not on file  Social History Narrative   Not on file   Social Determinants of Health   Financial Resource Strain: Low Risk  (04/13/2022)   Overall Financial Resource Strain (CARDIA)    Difficulty of Paying Living Expenses: Not hard at all  Food Insecurity: No Food Insecurity (04/13/2022)   Hunger Vital Sign    Worried About Running Out of Food in the Last Year: Never true    Ran Out of Food in the Last Year: Never true  Transportation Needs: No Transportation Needs (04/13/2022)   PRAPARE - Administrator, Civil Service (Medical): No    Lack of Transportation (Non-Medical): No  Physical Activity: Insufficiently Active (04/21/2022)   Exercise Vital Sign    Days of Exercise per Week: 3 days    Minutes of Exercise per Session: 10 min  Stress: Stress Concern Present (04/21/2022)   [employer] of Occupational Health - Occupational Stress Questionnaire    Feeling of Stress : To some extent  Social Connections: Moderately Isolated (04/13/2022)   Social Connection and Isolation Panel [NHANES]    Frequency of Communication with Friends and Family: More than three times a week    Frequency of Social Gatherings with Friends and Family: Three times a week    Attends Religious Services: Never    Active Member of Clubs or Organizations: No     Attends Engineer, structural: Never    Marital Status: Married   Family History  Problem Relation Age of Onset   Diabetes Other    Heart disease Other        has Visual merchandiser   Hypertension Mother    Kidney disease Mother    Hypertension Father    Kidney disease Father    Heart disease Father    Cancer Father        leukemia   Hypertension Sister    Hypertension Sister    Colon cancer Neg Hx    - negative except otherwise stated in the family history section Allergies  Allergen Reactions   Promethazine Hcl Nausea And Vomiting and Other (See Comments)    "Makes my head hurt really bad too"   Prior to Admission medications   Medication Sig Start Date End Date Taking? Authorizing Provider  acyclovir (ZOVIRAX) 400 MG tablet Take 1 tablet by mouth twice daily 02/22/22  Yes Paige, Victoria J, DO  amitriptyline (ELAVIL) 150 MG tablet Take 1 tablet by mouth once daily 04/21/22  Yes Paige, Victoria J, DO  amLODipine (NORVASC) 10 MG tablet Take 1 tablet (10 mg total) by mouth daily. 05/03/22 05/03/23 Yes Creig Hines, NP  atorvastatin (LIPITOR) 40 MG tablet Take 1 tablet (40 mg total) by mouth daily. [facility] March 2024. 03/17/22  Yes Fayette Pho, MD  budesonide-formoterol [facility]) 160-4.5 MCG/ACT inhaler Inhale 2 puffs into the lungs 2 (two) times daily. Patient taking differently: Inhale 2 puffs into the lungs 2 (two) times daily as needed (shortness of breath). 01/22/22  Yes Paige, Lucas Mallow, DO  celecoxib (CELEBREX) 200 MG capsule Take 1 capsule by mouth once daily 03/29/22  Yes Paige, Victoria J, DO  DULoxetine (CYMBALTA) 60 MG capsule Take 1 capsule by mouth twice daily 01/26/22  Yes Paige, Victoria J, DO  esomeprazole (NEXIUM) 40 MG capsule TAKE 1 CAPSULE BY MOUTH ONCE DAILY AT NOON 05/04/22  Yes Mahon, Frederik Schmidt, NP  furosemide (LASIX) 20 MG tablet Take 1 tablet (20 mg total) by mouth daily as needed for edema. 03/25/22   Yes Creig Hines, NP  linaclotide [facility]) 290 MCG CAPS capsule TAKE 1 CAPSULE BY MOUTH ONCE DAILY BEFORE BREAKFAST 04/05/22  Yes Mahon, Courtney L, NP  pregabalin (LYRICA) 100 MG capsule TAKE 1 CAPSULE  BY MOUTH THREE TIMES DAILY Patient taking differently: Take 100 mg by mouth 2 (two) times daily. 04/13/22  Yes Paige, [place] J, DO   No results found. - Positive ROS: All other systems have been reviewed and were otherwise negative with the exception of those mentioned in the HPI and as above.  Physical Exam: General: No acute distress, resting comfortably Cardiovascular: BUE warm and well perfused, normal rate Respiratory: Normal WOB on RA Skin: Warm and dry Neurologic: Sensation intact distally Psychiatric: Patient is at baseline mood and affect  Left upper Extremity  Skin is warm and dry.  There are no overlying lesions at the wrist or hand.  She has a positive Tinel and Durkan sign.  There is a firm, round, mobile, subcutaneous mass at the volar radial aspect of the wrist.  The mass is approximately 1 x 1 cm in size.  The radial artery is palpable adjacent to the mass.  Her hand is warm and well-perfused with brisk capillary refill.   Assessment: Patient is a 62 year old female with left carpal tunnel syndrome and a left volar carpal ganglion cyst both of which have failed conservative management and are significantly interfering with her quality of life.  Plan: OR today for left carpal tunnel release and excision of left volar carpal ganglion cyst. We again reviewed the risks of surgery which include bleeding, infection, damage to neurovascular structures, persistent symptoms, pillar pain, delayed wound healing, complex regional pain syndrome, mass recurrence, need for additional surgery.  Informed consent was signed.  All questions were answered.   Marlyne Beards, M.D. [facility] 8:34 AM

## 2022-05-19 NOTE — Op Note (Signed)
Date of Surgery: 05/19/2022  INDICATIONS: Patient is a 62 y.o.-year-old female with left carpal tunnel syndrome that was confirmed with electrodiagnostic studies and has failed conservative management with activity modification and nocturnal bracing.  She also has a mass at the volar and radial aspect of the wrist that has gradually enlarged in size and is becoming more symptomatic.  Risks, benefits, and alternatives to surgery were again discussed with the patient in the preoperative area. The patient wishes to proceed with surgery.  Informed consent was signed after our discussion.   PREOPERATIVE DIAGNOSIS:  Left carpal tunnel syndrome Left volar carpal ganglion cyst  POSTOPERATIVE DIAGNOSIS: Same.  PROCEDURE:  Left carpal tunnel syndrome Left volar carpal ganglion cyst excision   SURGEON: Waylan Rocher, M.D.  ASSIST: None  ANESTHESIA:  General  IV FLUIDS AND URINE: See anesthesia.  ESTIMATED BLOOD LOSS: 15 mL.  IMPLANTS: * No implants in log *   DRAINS: None  COMPLICATIONS: None  DESCRIPTION OF PROCEDURE: The patient was met in the preoperative holding area where the surgical site was marked and the consent form was signed.  The patient was then taken to the operating room and transferred to the operating table.  All bony prominences were well padded.  A tourniquet was applied to the left upper arm.  General endotracheal anesthesia was induced.  The operative extremity was prepped and draped in the usual and sterile fashion.  A formal time-out was performed to confirm that this was the correct patient, surgery, side, and site.   Following the formal timeout, the limb was exsanguinated and the tourniquet inflated to 250 mmHg.  A longitudinal incision was made in line with the radial border of the ring finger from distal to the wrist flexion crease to the intersection of Kaplan's cardinal line.  The skin and subcutaneous tissue was sharply divided.  The longitudinally running  palmar fascia was incised.  The thenar musculature was bluntly swept off of the transverse carpal ligament.  The ligament was divided from proximal to distal until the fat surrounding the palmar arch was encountered. Retractors were then placed in the proximal aspect of the wound to visualize the distal antebrachial fascia.  The fascia was sharply divided under direct visualization.   The wound was then thoroughly irrigated with sterile saline.  The tourniquet was deflated.  The wound was then closed with 4-0 nylon sutures in a horizontal mattress fashion.   I then turned my attention to the volar radial wrist mass.  A longitudinal incision was made directly over the mass.  The skin was incised.  Blunt dissection was used to identify the mass.  The mass was firm, round, and well circumscribed.  There were numerous vessels around the radial and ulnar aspect of the mass.  Careful blunt dissection and bipolar electrocautery was used to circumferentially dissect the lesion.  During the course of this superficial dissection, the cystic sac was inadvertently entered. There was copious clear, mucinous fluid within the cyst.  The dissection of this mass was particularly difficult owing to the degree of vascularity surrounding the lesion.  Following further dissection, I was able to identify the radial artery which was directly deep to the lesion.  The cystic sac was carefully dissected free of the underlying radial artery.  I did encounter some bleeding during this portion of the procedure.  Hemostasis was achieved with bipolar electrocautery.  The radial artery was intact without evidence of injury.  I was unable to clearly identify the capsular defect  or capsular origin of the cyst.  The cyst was carefully excised in it's entirety but I was unable to excise any portion of the wrist capsule. Following cyst excision, the tourniquet was deflated.  The wound was hemostatic.  The radial artery was pulsatile without evidence  of injury.  Her fingers were all warm and well perfused with brisk capillary refill with the tourniquet deflated.  The wound was then thoroughly irrigated with copious sterile saline.  It was closed using a 4-0 nylon suture in horizontal mattress fashion.  Both wounds were then dressed with xeroform, folded kerlix, and an ace wrap.  The patient was reversed from anesthesia and extubated uneventfully.  They were transferred from the operating table to the postoperative bed.  All counts were correct x 2 at the end of the procedure.  The patient was then taken to the PACU in stable condition.   POSTOPERATIVE PLAN: She will be discharged to home with appropriate pain medication and discharge instructions.  I will see her back in the office in 10-14 days for her first postop visit.   Waylan Rocher, MD 11:05 AM

## 2022-05-19 NOTE — Transfer of Care (Signed)
Immediate Anesthesia Transfer of Care Note  Patient: [name]  Procedure(s) Performed: CARPAL TUNNEL RELEASE (Left: Hand) left volar carpal ganglion cyst excision (Left: Wrist)  Patient Location: PACU  Anesthesia Type:General  Level of Consciousness: sedated and patient cooperative  Airway & Oxygen Therapy: Patient Spontanous Breathing and Patient connected to face mask oxygen  Post-op Assessment: Report given to RN and Post -op Vital signs reviewed and stable  Post vital signs: Reviewed and stable  Last Vitals:  Vitals Value Taken Time  BP 132/57 05/19/22 1055  Temp    Pulse 90 05/19/22 1057  Resp 24 05/19/22 1057  SpO2 93 % 05/19/22 1057  Vitals shown include unvalidated device data.  Last Pain:  Vitals:   05/19/22 0837  TempSrc: Oral  PainSc: 4       Patients Stated Pain Goal: 3 (05/19/22 0837)  Complications: No notable events documented.

## 2022-05-19 NOTE — Anesthesia Postprocedure Evaluation (Signed)
Anesthesia Post Note  Patient: [name]  Procedure(s) Performed: CARPAL TUNNEL RELEASE (Left: Hand) left volar carpal ganglion cyst excision (Left: Wrist)     Patient location during evaluation: PACU Anesthesia Type: General Level of consciousness: awake and alert Pain management: pain level controlled Vital Signs Assessment: post-procedure vital signs reviewed and stable Respiratory status: spontaneous breathing, nonlabored ventilation, respiratory function stable and patient connected to nasal cannula oxygen Cardiovascular status: blood pressure returned to baseline and stable Postop Assessment: no apparent nausea or vomiting Anesthetic complications: no  No notable events documented.  Last Vitals:  Vitals:   05/19/22 1145 05/19/22 1221  BP: 128/71 130/61  Pulse: 86 87  Resp: 17 16  Temp:  (!) 36.4 C  SpO2: 90% 93%    Last Pain:  Vitals:   05/19/22 1221  TempSrc: Temporal  PainSc: 4                  Trevor Iha

## 2022-05-19 NOTE — Anesthesia Procedure Notes (Signed)
Procedure Name: LMA Insertion Date/Time: 05/19/2022 9:37 AM  Performed by: Demetrio Lapping, CRNAPre-anesthesia Checklist: Patient identified, Emergency Drugs available, Suction available and Patient being monitored Patient Re-evaluated:Patient Re-evaluated prior to induction Oxygen Delivery Method: Circle System Utilized Preoxygenation: Pre-oxygenation with 100% oxygen Induction Type: IV induction Ventilation: Mask ventilation without difficulty LMA: LMA inserted LMA Size: 4.0 Number of attempts: 1 Airway Equipment and Method: Bite block Placement Confirmation: positive ETCO2 Tube secured with: Tape Dental Injury: Teeth and Oropharynx as per pre-operative assessment

## 2022-05-20 ENCOUNTER — Encounter (HOSPITAL_BASED_OUTPATIENT_CLINIC_OR_DEPARTMENT_OTHER): Payer: Self-pay | Admitting: Orthopedic Surgery

## 2022-05-20 ENCOUNTER — Ambulatory Visit (HOSPITAL_COMMUNITY): Admission: RE | Admit: 2022-05-20 | Payer: Medicare PPO | Source: Ambulatory Visit

## 2022-05-28 ENCOUNTER — Other Ambulatory Visit: Payer: Self-pay | Admitting: Family Medicine

## 2022-05-28 DIAGNOSIS — A609 Anogenital herpesviral infection, unspecified: Secondary | ICD-10-CM

## 2022-06-02 ENCOUNTER — Other Ambulatory Visit: Payer: Self-pay | Admitting: Family Medicine

## 2022-06-11 ENCOUNTER — Other Ambulatory Visit: Payer: Self-pay | Admitting: Family Medicine

## 2022-06-11 DIAGNOSIS — E7800 Pure hypercholesterolemia, unspecified: Secondary | ICD-10-CM

## 2022-06-19 NOTE — Progress Notes (Deleted)
GI Office Note    Referring Provider: Cora Collum, DO Primary Care Physician:  Cora Collum, DO  Primary Gastroenterologist: Roetta Sessions, MD   Chief Complaint   No chief complaint on file.   History of Present Illness   [name] is a 62 y.o. female presenting today for follow up. Last seen in 03/2022. H/o chronic constipation, chronic abdominal pain, GERD, dysphagia.     EGD 04/2022: -normal esophagus s/p dilation and biopsy. Query dilation of cervical web occurred with passage of maloney dilator. -reactive squamous mucosa with changes c/p reflux esophagitis.   Colonoscopy August 2019: -Grade 3 internal hemorrhoids -External hemorrhoids -Repeat colonoscopy in 10 years   Last EGD 02/27/2021 -mild Schatzki's ring s/p dilation and biopsy, gastritis s/p biopsy, normal duodenum.  Pathology revealing reactive gastropathy. Prior EGD Dec 2021 also with Schatzki ring and dilation. Medications   Current Outpatient Medications  Medication Sig Dispense Refill   acyclovir (ZOVIRAX) 400 MG tablet Take 1 tablet by mouth twice daily 180 tablet 0   amitriptyline (ELAVIL) 150 MG tablet Take 1 tablet by mouth once daily 90 tablet 0   amLODipine (NORVASC) 10 MG tablet Take 1 tablet (10 mg total) by mouth daily. 90 tablet 1   atorvastatin (LIPITOR) 40 MG tablet TAKE 1 TABLET BY MOUTH ONCE DAILY. PLEASE RETURN TO FAMILY MED CLINIC FOR CHOLESTEROL CHECK. 90 tablet 0   budesonide-formoterol (SYMBICORT) 160-4.5 MCG/ACT inhaler Inhale 2 puffs into the lungs 2 (two) times daily. (Patient taking differently: Inhale 2 puffs into the lungs 2 (two) times daily as needed (shortness of breath).) 1 each 3   celecoxib (CELEBREX) 200 MG capsule Take 1 capsule by mouth once daily 60 capsule 0   DULoxetine (CYMBALTA) 60 MG capsule Take 1 capsule by mouth twice daily 120 capsule 0   esomeprazole (NEXIUM) 40 MG capsule TAKE 1 CAPSULE BY MOUTH ONCE DAILY AT NOON 90 capsule 0   furosemide  (LASIX) 20 MG tablet Take 1 tablet (20 mg total) by mouth daily as needed for edema. 90 tablet 3   linaclotide (LINZESS) 290 MCG CAPS capsule TAKE 1 CAPSULE BY MOUTH ONCE DAILY BEFORE BREAKFAST 30 capsule 11   pregabalin (LYRICA) 100 MG capsule Take 1 capsule (100 mg total) by mouth 2 (two) times daily. 60 capsule 2   No current facility-administered medications for this visit.    Allergies   Allergies as of 06/21/2022 - Review Complete 05/19/2022  Allergen Reaction Noted   Promethazine hcl Nausea And Vomiting and Other (See Comments) 07/26/2008     Past Medical History   Past Medical History:  Diagnosis Date   Anxiety    Aortic insufficiency    a. 10/2017 Echo: Mod AI; b. 04/2020 Echo: Mod AI.   Asthma    every now and then.  Wheezing and coughing   Cancer (HCC) 1997   left lung   Chronic abdominal pain    Chronic back pain    Chronic joint pain    Depression    Diastolic dysfunction    a. 10/2017 Echo: EF 60-65%, GrII DD, mod AI; b. 04/2020 Echo: EF 60-65%, no rwma, GrI DD, nl RV fxn, mod AI.   Essential hypertension    GERD (gastroesophageal reflux disease)    HLD (hyperlipidemia)    Palpitations 01/2013   a. 11/2017 Holter: Occas PACs, rare PVCs. Rare nonsustained atrial tachycardia; b. 01/2018 Event monitor: Predominantly sinus rhythm @ 90 (64-135). No significant arrhythmias.   Shortness of  breath dyspnea    with exertion    Past Surgical History   Past Surgical History:  Procedure Laterality Date   ABDOMINAL HYSTERECTOMY     ABDOMINAL SURGERY     ovarian cyst removal   BALLOON DILATION N/A 02/27/2021   Procedure: BALLOON DILATION;  Surgeon: [name], DO;  [facility];  Service: Endoscopy;  Laterality: N/A;   BIOPSY N/A 11/22/2013   Procedure: GASTRIC BIOPSY;  Surgeon: [name], MD;  [facility]: [facility];  Service: Endoscopy;  Laterality: N/A;   BIOPSY  02/27/2021   Procedure: BIOPSY;  Surgeon: [name], DO;  [facility];  Service: Endoscopy;;   BIOPSY  05/12/2022   Procedure: BIOPSY;  Surgeon: [name], MD;  [facility];  Service: Endoscopy;;   BLADDER SURGERY  suspension x4   x 4   CARPAL TUNNEL RELEASE Right 07/11/2015   Procedure: RIGHT CARPAL TUNNEL RELEASE;  Surgeon: Kerrin Champagne, MD;  Location: [facility];  Service: Orthopedics;  Laterality: Right;   CARPAL TUNNEL RELEASE Left 05/19/2022   Procedure: CARPAL TUNNEL RELEASE;  Surgeon: Marlyne Beards, MD;  Location: [facility];  Service: Orthopedics;  Laterality: Left;  or MAC with regional 60   CHOLECYSTECTOMY N/A 04/05/2014   Procedure: LAPAROSCOPIC CHOLECYSTECTOMY;  Surgeon: Franky Macho Md, MD;  [facility]: [facility];  Service: General;  Laterality: N/A;   COCCYGECTOMY N/A 04/11/2015   Procedure: COCCYGECTOMY WITH OSTEOTOMY THROUGH AREA OF DEFORMITY;  Surgeon: Kerrin Champagne, MD;  [facility];  Service: Orthopedics;  Laterality: N/A;   COLONOSCOPY     COLONOSCOPY WITH PROPOFOL N/A 11/22/2013   Dr. Jena Gauss: Grade 3 and 4/internal hemorrhoids?"likely source of hematochezia Normal colonoscopy otherwise.    COLONOSCOPY WITH PROPOFOL N/A 09/15/2017   Dr. Lovena Neighbours: Hemorrhoids, next colonoscopy 10 years   ESOPHAGOGASTRODUODENOSCOPY (EGD) WITH PROPOFOL N/A 11/22/2013   Dr. Jena Gauss: Incomplete Schatzki's ring dilated and disrupted as described above.  Small hiatal hernia. Focally abnormal gastric mucosa of uncertain significance status post biopsy, reactive gastropathy, negative H.pylori   ESOPHAGOGASTRODUODENOSCOPY (EGD) WITH PROPOFOL N/A 08/04/2015   Dr. Jena Gauss: mild Schatzki's ring s/p dilation, small hiatal hernia    ESOPHAGOGASTRODUODENOSCOPY (EGD) WITH PROPOFOL N/A 03/17/2017   Mild Schatki's ring s/p dilation, small hiatal hernia, otherwise normal   ESOPHAGOGASTRODUODENOSCOPY (EGD) WITH PROPOFOL N/A 09/18/2018   Dr. Jena Gauss: Schatzki ring status post dilation and disruption.  Hiatal hernia.    ESOPHAGOGASTRODUODENOSCOPY (EGD) WITH PROPOFOL N/A 12/20/2019   non-obstructing Schatzki's ring s/p dilation with 56, 58, and 60 F.   ESOPHAGOGASTRODUODENOSCOPY (EGD) WITH PROPOFOL N/A 02/27/2021   Procedure: ESOPHAGOGASTRODUODENOSCOPY (EGD) WITH PROPOFOL;  Surgeon: [name], DO;  [facility];  Service: Endoscopy;  Laterality: N/A;  11:30am   ESOPHAGOGASTRODUODENOSCOPY (EGD) WITH PROPOFOL N/A 05/12/2022   Procedure: ESOPHAGOGASTRODUODENOSCOPY (EGD) WITH PROPOFOL;  Surgeon: [name], MD;  [facility];  Service: Endoscopy;  Laterality: N/A;  8:30 am   EXCISION VAGINAL CYST Left 06/20/2012   Procedure: EXCISION LEFT LABIAL CYST;  Surgeon: [name], MD;  [facility];  Service: Gynecology;  Laterality: Left;   GANGLION CYST EXCISION Left 05/19/2022   Procedure: left volar carpal ganglion cyst excision;  Surgeon: Marlyne Beards, MD;  Location: [facility];  Service: Orthopedics;  Laterality: Left;  or MAC with regional 60   LOBECTOMY Left    LUNG CANCER SURGERY  1997   age 47, resection only  MALONEY DILATION N/A 11/22/2013   Procedure: Elease Hashimoto DILATION;  Surgeon: [name], MD;  [facility];  Service: Endoscopy;  Laterality: N/A;  56   MALONEY DILATION N/A 08/04/2015   Procedure: Elease Hashimoto DILATION;  Surgeon: [name], MD;  [facility];  Service: Endoscopy;  Laterality: N/A;   MALONEY DILATION N/A 03/17/2017   Procedure: Elease Hashimoto DILATION;  Surgeon: [name], MD;  [facility];  Service: Endoscopy;  Laterality: N/A;   MALONEY DILATION N/A 09/18/2018   Procedure: Elease Hashimoto DILATION;  Surgeon: [name], MD;  [facility];  Service: Endoscopy;  Laterality: N/A;   MALONEY DILATION N/A 12/20/2019   Procedure: Elease Hashimoto DILATION;  Surgeon: [name], MD;  [facility];  Service: Endoscopy;  Laterality: N/A;   MALONEY DILATION N/A 05/12/2022   Procedure: Elease Hashimoto  DILATION;  Surgeon: [name], MD;  [facility];  Service: Endoscopy;  Laterality: N/A;   MASS EXCISION N/A 07/23/2014   Procedure: EXCISIONAL BIOPSY NODULE LEFT PARACOCCYGEAL AREA;  Surgeon: Kerrin Champagne, MD;  [facility];  Service: Orthopedics;  Laterality: N/A;   MASS EXCISION Left 10/10/2015   Procedure: EXCISIONAL BIOPSY OF LEFT DORSORADIAL FOREARM MASS LIPOMA;  Surgeon: Kerrin Champagne, MD;  Location: [facility];  Service: Orthopedics;  Laterality: Left;   TUBAL LIGATION      Past Family History   Family History  Problem Relation Age of Onset   Diabetes Other    Heart disease Other        has pace maker   Hypertension Mother    Kidney disease Mother    Hypertension Father    Kidney disease Father    Heart disease Father    Cancer Father        leukemia   Hypertension Sister    Hypertension Sister    Colon cancer Neg Hx     Past Social History   Social History   Socioeconomic History   Marital status: Married    Spouse name: Carlo   Number of children: 3   Years of education: Not on file   Highest education level: Not on file  Occupational History   Occupation: Disability  Tobacco Use   Smoking status: Former    Packs/day: 1.50    Years: 25.00    Additional pack years: 0.00    Total pack years: 37.50    Types: Cigarettes    Quit date: 04/07/1995    Years since quitting: 27.2   Smokeless tobacco: Never  Vaping Use   Vaping Use: Never used  Substance and Sexual Activity   Alcohol use: No   Drug use: No   Sexual activity: Not Currently    Birth control/protection: Surgical    Comment: hyst  Other Topics Concern   Not on file  Social History Narrative   Not on file   Social Determinants of Health   Financial Resource Strain: Low Risk  (04/13/2022)   Overall Financial Resource Strain (CARDIA)    Difficulty of Paying Living Expenses: Not hard at all  Food Insecurity: No Food Insecurity (04/13/2022)   Hunger Vital Sign     Worried About Running Out of Food in the Last Year: Never true    Ran Out of Food in the Last Year: Never true  Transportation Needs: No Transportation Needs (04/13/2022)   PRAPARE - Transportation    Lack of Transportation (Medical): No    Lack  of Transportation (Non-Medical): No  Physical Activity: Insufficiently Active (04/21/2022)   Exercise Vital Sign    Days of Exercise per Week: 3 days    Minutes of Exercise per Session: 10 min  Stress: Stress Concern Present (04/21/2022)   [employer] of Occupational Health - Occupational Stress Questionnaire    Feeling of Stress : To some extent  Social Connections: Moderately Isolated (04/13/2022)   Social Connection and Isolation Panel [NHANES]    Frequency of Communication with Friends and Family: More than three times a week    Frequency of Social Gatherings with Friends and Family: Three times a week    Attends Religious Services: Never    Active Member of Clubs or Organizations: No    Attends Banker Meetings: Never    Marital Status: Married  Catering manager Violence: Not At Risk (04/13/2022)   Humiliation, Afraid, Rape, and Kick questionnaire    Fear of Current or Ex-Partner: No    Emotionally Abused: No    Physically Abused: No    Sexually Abused: No    Review of Systems   General: Negative for anorexia, weight loss, fever, chills, fatigue, weakness. ENT: Negative for hoarseness, difficulty swallowing , nasal congestion. CV: Negative for chest pain, angina, palpitations, dyspnea on exertion, peripheral edema.  Respiratory: Negative for dyspnea at rest, dyspnea on exertion, cough, sputum, wheezing.  GI: See history of present illness. GU:  Negative for dysuria, hematuria, urinary incontinence, urinary frequency, nocturnal urination.  Endo: Negative for unusual weight change.     Physical Exam   There were no vitals taken for this visit.   General: Well-nourished, well-developed in no acute distress.  Eyes:  No icterus. Mouth: Oropharyngeal mucosa moist and pink , no lesions erythema or exudate. Lungs: Clear to auscultation bilaterally.  Heart: Regular rate and rhythm, no murmurs rubs or gallops.  Abdomen: Bowel sounds are normal, nontender, nondistended, no hepatosplenomegaly or masses,  no abdominal bruits or hernia , no rebound or guarding.  Rectal: ***  Extremities: No lower extremity edema. No clubbing or deformities. Neuro: Alert and oriented x 4   Skin: Warm and dry, no jaundice.   Psych: Alert and cooperative, normal mood and affect.  Labs   *** Imaging Studies   No results found.  Assessment       PLAN   ***   Leanna Battles. Melvyn Neth, MHS, PA-C [facility]

## 2022-06-21 ENCOUNTER — Ambulatory Visit: Payer: Medicare PPO | Admitting: Gastroenterology

## 2022-06-21 ENCOUNTER — Ambulatory Visit: Payer: Self-pay | Admitting: Licensed Clinical Social Worker

## 2022-06-21 NOTE — Patient Outreach (Signed)
  Care Coordination   06/21/2022 Name: [name] MRN: [id number] DOB: [birth year]   Care Coordination Outreach Attempts:  An unsuccessful telephone outreach was attempted today to offer the patient information about available care coordination services.  Follow Up Plan:  Additional outreach attempts will be made to offer the patient care coordination information and services.   Encounter Outcome:  No Answer   Care Coordination Interventions:  No, not indicated    Kelton Pillar.Escarlet Saathoff MSW, LCSW Licensed Visual merchandiser [facility] Care Management [phone]

## 2022-06-29 ENCOUNTER — Telehealth: Payer: Self-pay | Admitting: *Deleted

## 2022-06-29 NOTE — Progress Notes (Signed)
  Care Coordination Note  06/29/2022 Name: [name] MRN: [id number] DOB: 1960-08-20  [name] is a 62 y.o. year old female who is a primary care patient of Cora Collum, DO and is actively engaged with the care management team. I reached out to [name] by phone today to assist with re-scheduling a follow up visit with the Licensed Clinical Social Worker  Follow up plan: Unsuccessful telephone outreach attempt made. A HIPAA compliant phone message was left for the patient providing contact information and requesting a return call.   [facility]  Care Coordination Care Guide  Direct Dial: [phone]

## 2022-07-06 ENCOUNTER — Ambulatory Visit (HOSPITAL_COMMUNITY)
Admission: RE | Admit: 2022-07-06 | Discharge: 2022-07-06 | Disposition: A | Discharge status: Home / Self Care | Payer: Medicare PPO | Source: Ambulatory Visit | Attending: Nurse Practitioner | Admitting: Nurse Practitioner

## 2022-07-06 DIAGNOSIS — I351 Nonrheumatic aortic (valve) insufficiency: Secondary | ICD-10-CM | POA: Diagnosis not present

## 2022-07-06 LAB — ECHOCARDIOGRAM COMPLETE
AR max vel: 2.88 cm2
AV Area VTI: 3.1 cm2
AV Area mean vel: 2.81 cm2
AV Mean grad: 6 mmHg
AV Peak grad: 11.7 mmHg
Ao pk vel: 1.71 m/s
Area-P 1/2: 3.03 cm2
P 1/2 time: 302 msec
S' Lateral: 3 cm

## 2022-07-06 NOTE — Progress Notes (Signed)
*  PRELIMINARY RESULTS* Echocardiogram 2D Echocardiogram has been performed.  Stacey Drain 07/06/2022, 10:14 AM

## 2022-07-07 NOTE — Progress Notes (Signed)
  Care Coordination Note  07/07/2022 Name: [name] MRN: [id number] DOB: 06/25/1960  [name] is a 62 y.o. year old female who is a primary care patient of Cora Collum, DO and is actively engaged with the care management team. I reached out to [name] by phone today to assist with re-scheduling a follow up visit with the Licensed Clinical Social Worker  Follow up plan: Unsuccessful telephone outreach attempt made. A HIPAA compliant phone message was left for the patient providing contact information and requesting a return call.  We have been unable to make contact with the patient for follow up. The care management team is available to follow up with the patient after provider conversation with the patient regarding recommendation for care management engagement and subsequent re-referral to the care management team.   [facility] Coordination Care Guide  Direct Dial: [phone]

## 2022-07-13 ENCOUNTER — Ambulatory Visit: Payer: Medicare PPO | Admitting: Gastroenterology

## 2022-07-29 ENCOUNTER — Other Ambulatory Visit: Payer: Self-pay

## 2022-07-29 DIAGNOSIS — F339 Major depressive disorder, recurrent, unspecified: Secondary | ICD-10-CM

## 2022-07-29 MED ORDER — DULOXETINE HCL 60 MG PO CPEP: 60.0000 mg | ORAL_CAPSULE | Freq: Two times a day (BID) | ORAL | 1 refills | Status: DC

## 2022-08-05 ENCOUNTER — Other Ambulatory Visit: Payer: Self-pay

## 2022-08-05 MED ORDER — CELECOXIB 200 MG PO CAPS: 200.0000 mg | ORAL_CAPSULE | Freq: Every day | ORAL | 0 refills | Status: DC | PRN

## 2022-08-23 ENCOUNTER — Telehealth: Payer: Self-pay | Admitting: Gastroenterology

## 2022-08-23 NOTE — Telephone Encounter (Signed)
Patient cancelled her post-procedure f/u appt for 8/6 and said she wanted to be scheduled for a colonoscopy.

## 2022-08-23 NOTE — Progress Notes (Signed)
GI Office Note    Referring Provider: Cora Collum, DO Primary Care Physician:  Lincoln Brigham, MD  Primary Gastroenterologist: Roetta Sessions, MD   Chief Complaint   Chief Complaint  Patient presents with   Follow-up    Follow up EGD and abd pain. Pt states she is not feeling well today. States she gets like this when she is stressed. Abd pain is no better    History of Present Illness   [name] is a 62 y.o. female presenting today for follow up. Last seen in 03/2022. She has h/o GERD, chronic constipation, chronic abdominal pain.   At time of last office visit she was complaining of epigastric pain, burning, bitter taste in her mouth.  Symptoms worse with meals but also happening without food.  Using Nexium with mild relief. She also had dysphagia. Nexium increased to BID. Patient completed upper endoscopy as outlined below.   Patient states she does not feel any better. She is under a lot of stress. Feels like this could play a role in her symptoms. She has abdominal pain every day. Mostly epigastric pain but also lower abdomen. Not necessarily meal related. Has nausea and dry heaves but nothing ever comes up. Dysphagia better with esophageal dilation. No heartburn. She has BM every day on Linzess, has found this to be her miracle drug. Some days she may have runny stools, but not more than a couple of times and then stools return to normal. No melena, brbpr. She has a lot of issues with her hemorrhoids. She has both internal and external. Sometimes hurts to have BM and wipe. She ran out of hemorrhoid cream. Weight is down about 7 pounds since 04/2022.   No FH colon cancer. Niece had colon polyps.      Wt Readings from Last 10 Encounters:  08/24/22 167 lb (75.8 kg)  05/19/22 167 lb 12.3 oz (76.1 kg)  05/12/22 173 lb 15.1 oz (78.9 kg)  05/10/22 174 lb (78.9 kg)  04/05/22 172 lb 9.6 oz (78.3 kg)  03/25/22 174 lb (78.9 kg)  03/05/22 170 lb 9.6 oz (77.4 kg)  01/21/22  171 lb (77.6 kg)  05/18/21 177 lb (80.3 kg)  05/13/21 175 lb (79.4 kg)     Colonoscopy August 2019: -Grade 3 internal hemorrhoids -External hemorrhoids -Repeat colonoscopy in 10 years   Last EGD 02/27/2021 -mild Schatzki's ring s/p dilation and biopsy, gastritis s/p biopsy, normal duodenum.  Pathology revealing reactive gastropathy. Prior EGD Dec 2021 also with Schatzki ring and dilation.  EGD 04/2022: -normal esophagus. Dilated and biopsy. Reflux esophagitis. -query dilation of a cervical web occurred with passed of maloney dilator.   Medications   Current Outpatient Medications  Medication Sig Dispense Refill   acyclovir (ZOVIRAX) 400 MG tablet Take 1 tablet by mouth twice daily 180 tablet 0   amitriptyline (ELAVIL) 150 MG tablet Take 1 tablet by mouth once daily 90 tablet 0   amLODipine (NORVASC) 10 MG tablet Take 1 tablet (10 mg total) by mouth daily. 90 tablet 1   atorvastatin (LIPITOR) 40 MG tablet TAKE 1 TABLET BY MOUTH ONCE DAILY. PLEASE RETURN TO FAMILY MED CLINIC FOR CHOLESTEROL CHECK. 90 tablet 0   budesonide-formoterol (SYMBICORT) 160-4.5 MCG/ACT inhaler Inhale 2 puffs into the lungs 2 (two) times daily. (Patient taking differently: Inhale 2 puffs into the lungs 2 (two) times daily as needed (shortness of breath).) 1 each 3   celecoxib (CELEBREX) 200 MG capsule Take 1 capsule (200 mg  total) by mouth daily as needed. 60 capsule 0   DULoxetine (CYMBALTA) 60 MG capsule Take 1 capsule (60 mg total) by mouth 2 (two) times daily. 180 capsule 1   esomeprazole (NEXIUM) 40 MG capsule TAKE 1 CAPSULE BY MOUTH ONCE DAILY AT NOON 90 capsule 0   furosemide (LASIX) 20 MG tablet Take 1 tablet (20 mg total) by mouth daily as needed for edema. 90 tablet 3   linaclotide (LINZESS) 290 MCG CAPS capsule TAKE 1 CAPSULE BY MOUTH ONCE DAILY BEFORE BREAKFAST 30 capsule 11   pregabalin (LYRICA) 100 MG capsule Take 1 capsule (100 mg total) by mouth 2 (two) times daily. 60 capsule 2   No current  facility-administered medications for this visit.    Allergies   Allergies as of 08/24/2022 - Review Complete 08/24/2022  Allergen Reaction Noted   Promethazine hcl Nausea And Vomiting and Other (See Comments) 07/26/2008     Review of Systems   General: Negative for anorexia, weight loss, fever, chills, fatigue, weakness. ENT: Negative for hoarseness, difficulty swallowing , nasal congestion. CV: Negative for chest pain, angina, palpitations, dyspnea on exertion, peripheral edema.  Respiratory: Negative for dyspnea at rest, dyspnea on exertion, cough, sputum, wheezing.  GI: See history of present illness. GU:  Negative for dysuria, hematuria, urinary incontinence, urinary frequency, nocturnal urination.  Endo: Negative for unusual weight change.     Physical Exam   BP (!) 148/71   Pulse 81   Temp 97.8 F (36.6 C)   Ht 5\' 4"  (1.626 m)   Wt 167 lb (75.8 kg)   BMI 28.67 kg/m    General: Well-nourished, well-developed in no acute distress. Tearful throughout visit.  Eyes: No icterus. Mouth: Oropharyngeal mucosa moist and pink , no lesions erythema or exudate. Lungs: Clear to auscultation bilaterally.  Heart: Regular rate and rhythm, no murmurs rubs or gallops.  Abdomen: Bowel sounds are normal,  nondistended, no hepatosplenomegaly or masses, no abdominal bruits or hernia , no rebound or guarding. Moderate epigastric tenderness, mild lower abdominal tenderness. Rectal: not performed  Extremities: No lower extremity edema. No clubbing or deformities. Neuro: Alert and oriented x 4   Skin: Warm and dry, no jaundice.   Psych: Alert and cooperative, normal mood and affect.  Labs   No recent labs  Imaging Studies   No results found.  Assessment   *Abdominal pain *GERD *Constipation *Weight loss *Hemorrhoids  Patient continues to have significant abdominal pain both upper and lower abdomen not explained by recent EGD. Her typical reflux symptoms are adequately  controlled, her dysphagia has resolved. Ongoing abdominal pain with and without meals. Her constipation is adequately managed on Linzess. Her symptoms may be exacerbated by stress as she has also mentioned today however we need to rule out other sources. She is interested in a colonoscopy, at this time CT will likely be more beneficial. We will also update labs, as none done recently. She is having issues with hemorrhoids, her internal hemorrhoids may be eligible for banding but would need to be assess first. External hemorrhoids would require surgical intervention to directly treat and she is not interested in this.    PLAN   CMET, CBC, Lipase. CT A/P with contrast. Continue Nexium 40mg  daily. Continue Linzess daily. RX for anusol cream to apply bid for 2 weeks at a time.  We discussed criteria for hemorrhoid banding for internal hemorrhoids. External hemorrhoids not directly treated with banding. Hand out provided. If she wants to be evaluated and potentially  banded she will call back for hemorrhoid banding appt with Lewie Loron, NP or [name], NP.   Leanna Battles. Melvyn Neth, MHS, PA-C [facility]

## 2022-08-23 NOTE — Telephone Encounter (Signed)
Pt was put back on the schedule for 8/6, explained to pt that she is not due for a colonoscopy until 2029 and that provider will discuss next steps in trying to find out what is going on with pt. Pt verbalized understanding.

## 2022-08-24 ENCOUNTER — Encounter: Payer: Self-pay | Admitting: Gastroenterology

## 2022-08-24 ENCOUNTER — Telehealth: Payer: Self-pay | Admitting: *Deleted

## 2022-08-24 ENCOUNTER — Encounter: Payer: Self-pay | Admitting: *Deleted

## 2022-08-24 ENCOUNTER — Ambulatory Visit (INDEPENDENT_AMBULATORY_CARE_PROVIDER_SITE_OTHER): Payer: Medicare PPO | Admitting: Gastroenterology

## 2022-08-24 ENCOUNTER — Ambulatory Visit: Payer: Medicare PPO | Admitting: Gastroenterology

## 2022-08-24 VITALS — BP 148/71 | HR 81 | Temp 97.8°F | Ht 64.0 in | Wt 167.0 lb

## 2022-08-24 DIAGNOSIS — K2100 Gastro-esophageal reflux disease with esophagitis, without bleeding: Secondary | ICD-10-CM

## 2022-08-24 DIAGNOSIS — K649 Unspecified hemorrhoids: Secondary | ICD-10-CM

## 2022-08-24 DIAGNOSIS — R1010 Upper abdominal pain, unspecified: Secondary | ICD-10-CM

## 2022-08-24 DIAGNOSIS — K219 Gastro-esophageal reflux disease without esophagitis: Secondary | ICD-10-CM | POA: Insufficient documentation

## 2022-08-24 DIAGNOSIS — R1030 Lower abdominal pain, unspecified: Secondary | ICD-10-CM

## 2022-08-24 DIAGNOSIS — R109 Unspecified abdominal pain: Secondary | ICD-10-CM | POA: Insufficient documentation

## 2022-08-24 DIAGNOSIS — R634 Abnormal weight loss: Secondary | ICD-10-CM | POA: Insufficient documentation

## 2022-08-24 DIAGNOSIS — K5900 Constipation, unspecified: Secondary | ICD-10-CM

## 2022-08-24 MED ORDER — HYDROCORTISONE (PERIANAL) 2.5 % EX CREA: 1.0000 | TOPICAL_CREAM | Freq: Two times a day (BID) | CUTANEOUS | 0 refills | Status: DC

## 2022-08-24 NOTE — Patient Instructions (Addendum)
Continue Nexium 40mg  daily before breakfast.  Continue Linzess daily before breakfast.  Complete labs and CT scan.  I have sent in RX for anusol cream to use on your hemorrhoids up to twice daily for 2 weeks at a time. If you decide you would like to try hemorrhoid banding, please call and request an appointment with Lewie Loron, NP for "hemorrhoid banding".

## 2022-08-24 NOTE — Telephone Encounter (Signed)
[facility]  CT A/P scheduled for 08/25/22, arrive at 12:45 pm to check in, NPO 4 hours prior.

## 2022-08-24 NOTE — Telephone Encounter (Signed)
[facility] PA: CT A/P w/contrast, Approved Authorization #[id number]  Tracking #[id number] Dates of service 08/25/2022 - 11/05/2022

## 2022-08-24 NOTE — Telephone Encounter (Signed)
Pt informed of CT appointment date, arrival time and instructions. Verbalized understanding.

## 2022-08-25 ENCOUNTER — Ambulatory Visit (HOSPITAL_COMMUNITY)
Admission: RE | Admit: 2022-08-25 | Discharge: 2022-08-25 | Disposition: A | Discharge status: Home / Self Care | Payer: Medicare PPO | Source: Ambulatory Visit | Attending: Gastroenterology | Admitting: Gastroenterology

## 2022-08-25 ENCOUNTER — Encounter (HOSPITAL_COMMUNITY): Payer: Self-pay | Admitting: Radiology

## 2022-08-25 DIAGNOSIS — K5900 Constipation, unspecified: Secondary | ICD-10-CM | POA: Insufficient documentation

## 2022-08-25 DIAGNOSIS — K649 Unspecified hemorrhoids: Secondary | ICD-10-CM | POA: Diagnosis not present

## 2022-08-25 DIAGNOSIS — R1030 Lower abdominal pain, unspecified: Secondary | ICD-10-CM | POA: Insufficient documentation

## 2022-08-25 DIAGNOSIS — R1010 Upper abdominal pain, unspecified: Secondary | ICD-10-CM | POA: Diagnosis not present

## 2022-08-25 DIAGNOSIS — K2100 Gastro-esophageal reflux disease with esophagitis, without bleeding: Secondary | ICD-10-CM | POA: Insufficient documentation

## 2022-08-25 DIAGNOSIS — R1013 Epigastric pain: Secondary | ICD-10-CM | POA: Diagnosis not present

## 2022-08-25 DIAGNOSIS — R634 Abnormal weight loss: Secondary | ICD-10-CM | POA: Diagnosis not present

## 2022-08-25 MED ORDER — IOHEXOL 300 MG/ML  SOLN
100.0000 mL | Freq: Once | INTRAMUSCULAR | Status: AC | PRN
  Administered 2022-08-25: 100 mL via INTRAVENOUS

## 2022-08-26 ENCOUNTER — Other Ambulatory Visit: Payer: Self-pay | Admitting: Gastroenterology

## 2022-08-26 DIAGNOSIS — K219 Gastro-esophageal reflux disease without esophagitis: Secondary | ICD-10-CM

## 2022-08-30 ENCOUNTER — Other Ambulatory Visit: Payer: Self-pay | Admitting: *Deleted

## 2022-08-30 DIAGNOSIS — M48062 Spinal stenosis, lumbar region with neurogenic claudication: Secondary | ICD-10-CM

## 2022-08-31 ENCOUNTER — Encounter: Payer: Self-pay | Admitting: Gastroenterology

## 2022-08-31 ENCOUNTER — Ambulatory Visit (INDEPENDENT_AMBULATORY_CARE_PROVIDER_SITE_OTHER): Payer: Medicare PPO | Admitting: Gastroenterology

## 2022-08-31 VITALS — BP 109/66 | HR 88 | Temp 97.9°F | Ht 67.0 in | Wt 166.0 lb

## 2022-08-31 DIAGNOSIS — K642 Third degree hemorrhoids: Secondary | ICD-10-CM

## 2022-08-31 DIAGNOSIS — K641 Second degree hemorrhoids: Secondary | ICD-10-CM | POA: Diagnosis not present

## 2022-08-31 NOTE — Patient Instructions (Addendum)
Continue to avoid straining. Limit toilet time to 2-3 minutes at the most.   Avoid constipation. Take 2 tablespoons of natural wheat bran, natural oat bran, flax, Benefiber or any over the counter fiber supplement and increase your water intake to 7-8 glasses daily.  Continue Linzess 290 mcg daily.  Occasionally, you may have more bleeding than usual after the banding procedure. This is often from the untreated hemorrhoids rather than the treated one. Don't be concerned if there is a tablespoon or so of blood. If there is more blood than this, lie flat with your bottom higher than your head and apply an ice pack to the area. If the bleeding does not stop within a half an hour or if you feel faint, have severe pain, chills, fever or difficulty passing urine (very rare) or other problems, you should call us at [phone] or report to the nearest emergency room. Please call me with any concerns!  The procedure you have had should have been relatively painless since the banding of the area involved does not have nerve endings and there is no pain sensation. The rubber band cuts off the blood supply to the hemorrhoid and the band may fall off as soon as 48 hours after the banding (the band may occasionally be seen in the toilet bowl following a bowel movement). You may notice a temporary feeling of fullness in the rectum which should respond adequately to plain Tylenol or Motrin.  I will see you back in 3 weeks for additional banding.  [name], MSN, FNP-BC, AGACNP-BC [facility]

## 2022-08-31 NOTE — Progress Notes (Signed)
   [facility] Banding Procedure Note:   [name] is a 62 y.o. female presenting today for consideration of hemorrhoid banding. Last colonoscopy August 2019 with grade 3 internal hemorrhoids and external hemorrhoids.  Latex Allergy: no  Interval History: Last seen 08/24/2022 where she reported constipation was well-controlled with Linzess.  Given abdominal pain CT will be updated and patient was interested in colonoscopy.  Noted have issues with hemorrhoids.  She was advised that external hemorrhoids would require surgical intervention but banding could be considered for internal hemorrhoids.  The patient presents with symptomatic grade 2/3 hemorrhoids, unresponsive to maximal medical therapy, requesting rubber band ligation of his/her hemorrhoidal disease. All risks, benefits, and alternative forms of therapy were described and informed consent was obtained.  In the left lateral decubitus position anoscopic examination revealed grade 2 hemorrhoids in the right posterior and right anterior positions. DRE with good rectal tone and mild external hemorrhoid to 4 o'clock position  The decision was made to band the right posterior internal hemorrhoid, and the [facility] O'Regan System was used to perform band ligation without complication. Digital anorectal examination was then performed to assure proper positioning of the band, and to adjust the banded tissue as required. The patient was discharged home without pain or other issues. Dietary and behavioral recommendations were given and (if necessary prescriptions were given), along with follow-up instructions. The patient will return in 3 weeks for additional banding as required.  No complications were encountered and the patient tolerated the procedure well.   [name], MSN, FNP-BC, AGACNP-BC [facility]

## 2022-09-02 ENCOUNTER — Other Ambulatory Visit: Payer: Self-pay

## 2022-09-02 MED ORDER — AMITRIPTYLINE HCL 150 MG PO TABS
ORAL_TABLET | ORAL | 0 refills | Status: DC
Start: 2022-09-02 — End: 2022-09-03

## 2022-09-03 ENCOUNTER — Other Ambulatory Visit: Payer: Self-pay

## 2022-09-03 ENCOUNTER — Other Ambulatory Visit: Payer: Self-pay | Admitting: *Deleted

## 2022-09-03 DIAGNOSIS — M48062 Spinal stenosis, lumbar region with neurogenic claudication: Secondary | ICD-10-CM

## 2022-09-03 NOTE — Telephone Encounter (Signed)
Patient returns call to nurse line regarding prescription refill.   She states that pharmacy has been faxing our office for 3 days with no response.   I am not able to find any previous fax requests for this medication.   Veronda Prude, RN

## 2022-09-06 ENCOUNTER — Telehealth: Payer: Self-pay | Admitting: Family Medicine

## 2022-09-06 ENCOUNTER — Telehealth: Payer: Self-pay

## 2022-09-06 MED ORDER — AMITRIPTYLINE HCL 150 MG PO TABS
ORAL_TABLET | ORAL | 0 refills | Status: DC
Start: 2022-09-06 — End: 2023-02-18

## 2022-09-06 MED ORDER — PREGABALIN 100 MG PO CAPS: 100.0000 mg | ORAL_CAPSULE | Freq: Two times a day (BID) | ORAL | 2 refills | Status: DC

## 2022-09-06 NOTE — Telephone Encounter (Signed)
Patient called fussing about medication not being called in and being sent to the nurse line numerous times and getting VM. She is wanting doctor to call her.   Please advise.   Thanks!

## 2022-09-06 NOTE — Telephone Encounter (Signed)
Returned pt call. Informed her that medication refills have already been sent to her pharmacy. Pt expressed appreciation.

## 2022-09-06 NOTE — Telephone Encounter (Signed)
Pt called wanting to know the results to her recent CT. Pt was made aware that the provider is out of the office and will return on 09/07/22.

## 2022-09-07 ENCOUNTER — Other Ambulatory Visit: Payer: Self-pay

## 2022-09-07 DIAGNOSIS — A609 Anogenital herpesviral infection, unspecified: Secondary | ICD-10-CM

## 2022-09-07 DIAGNOSIS — E7800 Pure hypercholesterolemia, unspecified: Secondary | ICD-10-CM

## 2022-09-08 ENCOUNTER — Telehealth: Payer: Self-pay | Admitting: Gastroenterology

## 2022-09-08 MED ORDER — ACYCLOVIR 400 MG PO TABS
400.0000 mg | ORAL_TABLET | Freq: Two times a day (BID) | ORAL | 0 refills | Status: DC
Start: 2022-09-08 — End: 2022-12-08

## 2022-09-08 MED ORDER — ATORVASTATIN CALCIUM 40 MG PO TABS
40.0000 mg | ORAL_TABLET | Freq: Every day | ORAL | 0 refills | Status: DC
Start: 2022-09-08 — End: 2022-12-08

## 2022-09-08 NOTE — Telephone Encounter (Signed)
Message has already been sent to provider, see other telephone message.

## 2022-09-08 NOTE — Telephone Encounter (Signed)
Patient left a message asking to get her CT scan results

## 2022-09-08 NOTE — Telephone Encounter (Signed)
Pt is calling back today wanting to know her results.

## 2022-09-09 NOTE — Telephone Encounter (Addendum)
See result note  Also can you remind pt to complete labs

## 2022-09-10 ENCOUNTER — Other Ambulatory Visit: Payer: Self-pay | Admitting: Gastroenterology

## 2022-09-10 MED ORDER — SUCRALFATE 1 GM/10ML PO SUSP: 1.0000 g | Freq: Three times a day (TID) | ORAL | 0 refills | Status: DC

## 2022-09-10 NOTE — Telephone Encounter (Signed)
Pt was made aware of results. Pt states that she will have labs done next week. Pt would like a course of carafate sent to the pharmacy.

## 2022-09-13 DIAGNOSIS — R634 Abnormal weight loss: Secondary | ICD-10-CM | POA: Diagnosis not present

## 2022-09-13 DIAGNOSIS — R1010 Upper abdominal pain, unspecified: Secondary | ICD-10-CM | POA: Diagnosis not present

## 2022-09-13 DIAGNOSIS — R1030 Lower abdominal pain, unspecified: Secondary | ICD-10-CM | POA: Diagnosis not present

## 2022-09-13 DIAGNOSIS — K5900 Constipation, unspecified: Secondary | ICD-10-CM | POA: Diagnosis not present

## 2022-09-13 DIAGNOSIS — K2100 Gastro-esophageal reflux disease with esophagitis, without bleeding: Secondary | ICD-10-CM | POA: Diagnosis not present

## 2022-09-13 DIAGNOSIS — K649 Unspecified hemorrhoids: Secondary | ICD-10-CM | POA: Diagnosis not present

## 2022-09-13 NOTE — Telephone Encounter (Signed)
Rx sent 

## 2022-09-14 LAB — COMPREHENSIVE METABOLIC PANEL
ALT: 11 IU/L (ref 0–32)
BUN/Creatinine Ratio: 9 (ref 9–23)
Bilirubin Total: 0.4 mg/dL (ref 0.0–1.2)
Sodium: 142 mmol/L (ref 134–144)

## 2022-09-14 LAB — CBC WITH DIFFERENTIAL/PLATELET
Basophils Absolute: 0 10*3/uL (ref 0.0–0.2)
Hematocrit: 41.3 % (ref 34.0–46.6)
Lymphocytes Absolute: 1.7 10*3/uL (ref 0.7–3.1)
RBC: 4.55 x10E6/uL (ref 3.77–5.28)

## 2022-09-23 ENCOUNTER — Other Ambulatory Visit: Payer: Self-pay | Admitting: Family Medicine

## 2022-09-27 ENCOUNTER — Other Ambulatory Visit: Payer: Self-pay

## 2022-09-27 DIAGNOSIS — R7989 Other specified abnormal findings of blood chemistry: Secondary | ICD-10-CM

## 2022-09-27 DIAGNOSIS — R748 Abnormal levels of other serum enzymes: Secondary | ICD-10-CM

## 2022-09-28 ENCOUNTER — Ambulatory Visit (INDEPENDENT_AMBULATORY_CARE_PROVIDER_SITE_OTHER): Payer: Medicare HMO | Admitting: Gastroenterology

## 2022-09-28 ENCOUNTER — Other Ambulatory Visit: Payer: Self-pay | Admitting: *Deleted

## 2022-09-28 ENCOUNTER — Encounter: Payer: Self-pay | Admitting: Gastroenterology

## 2022-09-28 VITALS — BP 136/70 | HR 86 | Temp 97.7°F | Ht 64.0 in | Wt 165.4 lb

## 2022-09-28 DIAGNOSIS — K642 Third degree hemorrhoids: Secondary | ICD-10-CM

## 2022-09-28 NOTE — Progress Notes (Signed)
   [facility] Banding Procedure Note:   [name] is a 62 y.o. female presenting today for consideration of hemorrhoid banding. Last colonoscopy August 2019 with grade 3 internal hemorrhoids and external hemorrhoids.  Latex Allergy: no  Patient reports lots of discomfort with last banding however her sister who accompanies her said this only lasted a few hours.   The patient presents with symptomatic grade 2/3 hemorrhoids, unresponsive to maximal medical therapy, requesting rubber band ligation of his/her hemorrhoidal disease. All risks, benefits, and alternative forms of therapy were described and informed consent was obtained.  The decision was made to band the right anterior internal hemorrhoid, and the [facility] O'Regan System was used to perform band ligation without complication. Digital anorectal examination was then performed to assure proper positioning of the band, and to adjust the banded tissue as required.   She has presence of some very small external hemorrhoids however she would like to consider removal of these and additional internal hemorrhoids via surgical approach. We did discuss the long recovery and likelihood of pain and importance of controlling constipation.   The patient was discharged home without pain or other issues. Dietary and behavioral recommendations were given and (if necessary prescriptions were given), along with follow-up instructions. The patient will return in 6-8 weeks for  general follow up.  No complications were encountered and the patient tolerated the procedure well.   Will send general surgery referral for removal of other internal hemorrhoid tissue and external hemorrhoids per patient request.   [name], MSN, FNP-BC, AGACNP-BC [facility]

## 2022-09-28 NOTE — Patient Instructions (Addendum)
Continue to avoid straining. Limit toilet time to 2-3 minutes at the most. Avoid constipation. Take 2 tablespoons of natural wheat bran, natural oat bran, flax, Benefiber or any over the counter fiber supplement and increase your water intake to 7-8 glasses daily.  Continue Linzess.   Occasionally, you may have more bleeding than usual after the banding procedure. This is often from the untreated hemorrhoids rather than the treated one. Don't be concerned if there is a tablespoon or so of blood. If there is more blood than this, lie flat with your bottom higher than your head and apply an ice pack to the area. If the bleeding does not stop within a half an hour or if you feel faint, have severe pain, chills, fever or difficulty passing urine (very rare) or other problems, you should call us at [phone] or report to the nearest emergency room. Please call me with any concerns!  The procedure you have had should have been relatively painless since the banding of the area involved does not have nerve endings and there is no pain sensation. The rubber band cuts off the blood supply to the hemorrhoid and the band may fall off as soon as 48 hours after the banding (the band may occasionally be seen in the toilet bowl following a bowel movement). You may notice a temporary feeling of fullness in the rectum which should respond adequately to plain Tylenol or Motrin.  Follow up in 6-8 weeks.   I will send a referral to general surgery for you today to discuss removal of other internal hemorrhoids and external hemorrhoids.  You should expect lab slips in the mail the first week of October. Once you receive these labs you should have them performed.    [name], MSN, FNP-BC, AGACNP-BC [facility]

## 2022-10-01 ENCOUNTER — Other Ambulatory Visit: Payer: Self-pay

## 2022-10-04 MED ORDER — BUDESONIDE-FORMOTEROL FUMARATE 160-4.5 MCG/ACT IN AERO: 2.0000 | INHALATION_SPRAY | Freq: Two times a day (BID) | RESPIRATORY_TRACT | 3 refills | Status: DC

## 2022-10-13 ENCOUNTER — Other Ambulatory Visit: Payer: Self-pay

## 2022-10-13 DIAGNOSIS — R7989 Other specified abnormal findings of blood chemistry: Secondary | ICD-10-CM

## 2022-10-13 DIAGNOSIS — R748 Abnormal levels of other serum enzymes: Secondary | ICD-10-CM

## 2022-10-25 ENCOUNTER — Encounter: Payer: Self-pay | Admitting: Family Medicine

## 2022-10-25 DIAGNOSIS — R748 Abnormal levels of other serum enzymes: Secondary | ICD-10-CM | POA: Diagnosis not present

## 2022-10-25 DIAGNOSIS — Z1231 Encounter for screening mammogram for malignant neoplasm of breast: Secondary | ICD-10-CM

## 2022-10-25 DIAGNOSIS — R7989 Other specified abnormal findings of blood chemistry: Secondary | ICD-10-CM | POA: Diagnosis not present

## 2022-10-27 ENCOUNTER — Other Ambulatory Visit: Payer: Self-pay | Admitting: Family Medicine

## 2022-10-27 DIAGNOSIS — Z1231 Encounter for screening mammogram for malignant neoplasm of breast: Secondary | ICD-10-CM

## 2022-10-30 LAB — HEPATIC FUNCTION PANEL
ALT: 11 [IU]/L (ref 0–32)
AST: 15 [IU]/L (ref 0–40)
Albumin: 4.1 g/dL (ref 3.8–4.9)
Alkaline Phosphatase: 177 [IU]/L — ABNORMAL HIGH (ref 44–121)
Bilirubin Total: 0.4 mg/dL (ref 0.0–1.2)
Bilirubin, Direct: <0.1 mg/dL (ref 0.00–0.40)
Total Protein: 7 g/dL (ref 6.0–8.5)

## 2022-10-30 LAB — ALKALINE PHOSPHATASE, ISOENZYMES
BONE FRACTION: 42 % (ref 14–68)
INTESTINAL FRAC.: 1 % (ref 0–18)
LIVER FRACTION: 57 % (ref 18–85)

## 2022-10-30 LAB — MITOCHONDRIAL ANTIBODIES: Mitochondrial Ab: <20 U (ref 0.0–20.0)

## 2022-10-30 LAB — GAMMA GT: GGT: 33 [IU]/L (ref 0–60)

## 2022-11-12 ENCOUNTER — Ambulatory Visit
Admission: RE | Admit: 2022-11-12 | Discharge: 2022-11-12 | Disposition: A | Discharge status: Home / Self Care | Payer: No Typology Code available for payment source | Source: Ambulatory Visit | Attending: Family Medicine

## 2022-11-12 DIAGNOSIS — Z1231 Encounter for screening mammogram for malignant neoplasm of breast: Secondary | ICD-10-CM | POA: Diagnosis not present

## 2022-11-15 ENCOUNTER — Other Ambulatory Visit: Payer: Self-pay | Admitting: *Deleted

## 2022-11-15 ENCOUNTER — Telehealth: Payer: Self-pay

## 2022-11-15 ENCOUNTER — Other Ambulatory Visit: Payer: Self-pay

## 2022-11-15 DIAGNOSIS — R748 Abnormal levels of other serum enzymes: Secondary | ICD-10-CM

## 2022-11-15 NOTE — Telephone Encounter (Signed)
error 

## 2022-11-16 ENCOUNTER — Encounter: Payer: Self-pay | Admitting: General Surgery

## 2022-11-16 ENCOUNTER — Ambulatory Visit (INDEPENDENT_AMBULATORY_CARE_PROVIDER_SITE_OTHER): Payer: No Typology Code available for payment source | Admitting: General Surgery

## 2022-11-16 ENCOUNTER — Ambulatory Visit: Payer: Self-pay | Admitting: Gastroenterology

## 2022-11-16 ENCOUNTER — Telehealth: Payer: Self-pay | Admitting: *Deleted

## 2022-11-16 VITALS — BP 114/73 | HR 79 | Temp 98.4°F | Resp 16 | Ht 64.0 in | Wt 161.0 lb

## 2022-11-16 DIAGNOSIS — K648 Other hemorrhoids: Secondary | ICD-10-CM | POA: Diagnosis not present

## 2022-11-16 NOTE — Telephone Encounter (Signed)
Pt called and wanted her lab results mailed to her, because she could not hear when they were being explained. Mailed lab results.

## 2022-11-17 NOTE — Progress Notes (Signed)
[name]; [id number]; 21-Aug-1960   HPI Patient is a 62 year old black female who was referred to my care by Dr. Sherrilee Gilles and [name] for evaluation and treatment of hemorrhoidal disease.  Patient has had multiple internal hemorrhoids treated with banding and she was concerned about a possible external hemorrhoid that she wanted removed.  She knows she needs another internal hemorrhoid band placed.  She denies any blood per rectum.  Her bowel movements are regular. Past Medical History:  Diagnosis Date   Anxiety    Aortic insufficiency    a. 10/2017 Echo: Mod AI; b. 04/2020 Echo: Mod AI.   Asthma    every now and then.  Wheezing and coughing   Cancer (HCC) 1997   left lung   Chronic abdominal pain    Chronic back pain    Chronic joint pain    Depression    Diastolic dysfunction    a. 10/2017 Echo: EF 60-65%, GrII DD, mod AI; b. 04/2020 Echo: EF 60-65%, no rwma, GrI DD, nl RV fxn, mod AI.   Essential hypertension    GERD (gastroesophageal reflux disease)    HLD (hyperlipidemia)    Palpitations 01/2013   a. 11/2017 Holter: Occas PACs, rare PVCs. Rare nonsustained atrial tachycardia; b. 01/2018 Event monitor: Predominantly sinus rhythm @ 90 (64-135). No significant arrhythmias.   Shortness of breath dyspnea    with exertion    Past Surgical History:  Procedure Laterality Date   ABDOMINAL HYSTERECTOMY     ABDOMINAL SURGERY     ovarian cyst removal   BALLOON DILATION N/A 02/27/2021   Procedure: BALLOON DILATION;  Surgeon: [name], DO;  [facility];  Service: Endoscopy;  Laterality: N/A;   BIOPSY N/A 11/22/2013   Procedure: GASTRIC BIOPSY;  Surgeon: [name], MD;  Location: [facility];  Service: Endoscopy;  Laterality: N/A;   BIOPSY  02/27/2021   Procedure: BIOPSY;  Surgeon: [name], DO;  [facility];  Service: Endoscopy;;   BIOPSY  05/12/2022   Procedure: BIOPSY;  Surgeon: [name], MD;  [facility];  Service:  Endoscopy;;   BLADDER SURGERY  suspension x4   x 4   CARPAL TUNNEL RELEASE Right 07/11/2015   Procedure: RIGHT CARPAL TUNNEL RELEASE;  Surgeon: Kerrin Champagne, MD;  Location: [facility];  Service: Orthopedics;  Laterality: Right;   CARPAL TUNNEL RELEASE Left 05/19/2022   Procedure: CARPAL TUNNEL RELEASE;  Surgeon: Marlyne Beards, MD;  Location: [facility];  Service: Orthopedics;  Laterality: Left;  or MAC with regional 60   CHOLECYSTECTOMY N/A 04/05/2014   Procedure: LAPAROSCOPIC CHOLECYSTECTOMY;  Surgeon: Franky Macho Md, MD;  Location: [facility];  Service: General;  Laterality: N/A;   COCCYGECTOMY N/A 04/11/2015   Procedure: COCCYGECTOMY WITH OSTEOTOMY THROUGH AREA OF DEFORMITY;  Surgeon: Kerrin Champagne, MD;  Location: [facility];  Service: Orthopedics;  Laterality: N/A;   COLONOSCOPY     COLONOSCOPY WITH PROPOFOL N/A 11/22/2013   Dr. Jena Gauss: Grade 3 and 4/internal hemorrhoids?"likely source of hematochezia Normal colonoscopy otherwise.    COLONOSCOPY WITH PROPOFOL N/A 09/15/2017   Dr. Lovena Neighbours: Hemorrhoids, next colonoscopy 10 years   ESOPHAGOGASTRODUODENOSCOPY (EGD) WITH PROPOFOL N/A 11/22/2013   Dr. Jena Gauss: Incomplete Schatzki's ring dilated and disrupted as described above.  Small hiatal hernia. Focally abnormal gastric mucosa of uncertain significance status post biopsy, reactive gastropathy, negative H.pylori   ESOPHAGOGASTRODUODENOSCOPY (EGD) WITH PROPOFOL N/A 08/04/2015   Dr. Jena Gauss: mild Schatzki's  ring s/p dilation, small hiatal hernia    ESOPHAGOGASTRODUODENOSCOPY (EGD) WITH PROPOFOL N/A 03/17/2017   Mild Schatki's ring s/p dilation, small hiatal hernia, otherwise normal   ESOPHAGOGASTRODUODENOSCOPY (EGD) WITH PROPOFOL N/A 09/18/2018   Dr. Jena Gauss: Schatzki ring status post dilation and disruption.  Hiatal hernia.   ESOPHAGOGASTRODUODENOSCOPY (EGD) WITH PROPOFOL N/A 12/20/2019   non-obstructing Schatzki's ring s/p dilation with 56, 58, and 60 F.    ESOPHAGOGASTRODUODENOSCOPY (EGD) WITH PROPOFOL N/A 02/27/2021   Procedure: ESOPHAGOGASTRODUODENOSCOPY (EGD) WITH PROPOFOL;  Surgeon: [name], DO;  [facility];  Service: Endoscopy;  Laterality: N/A;  11:30am   ESOPHAGOGASTRODUODENOSCOPY (EGD) WITH PROPOFOL N/A 05/12/2022   Procedure: ESOPHAGOGASTRODUODENOSCOPY (EGD) WITH PROPOFOL;  Surgeon: [name], MD;  [facility];  Service: Endoscopy;  Laterality: N/A;  8:30 am   EXCISION VAGINAL CYST Left 06/20/2012   Procedure: EXCISION LEFT LABIAL CYST;  Surgeon: [name], MD;  Location: [facility];  Service: Gynecology;  Laterality: Left;   GANGLION CYST EXCISION Left 05/19/2022   Procedure: left volar carpal ganglion cyst excision;  Surgeon: Marlyne Beards, MD;  Location: [facility];  Service: Orthopedics;  Laterality: Left;  or MAC with regional 60   LOBECTOMY Left    LUNG CANCER SURGERY  1997   age 4, resection only   MALONEY DILATION N/A 11/22/2013   Procedure: MALONEY DILATION;  Surgeon: [name], MD;  [facility]: [facility];  Service: Endoscopy;  Laterality: N/A;  56   MALONEY DILATION N/A 08/04/2015   Procedure: Elease Hashimoto DILATION;  Surgeon: [name], MD;  [facility];  Service: Endoscopy;  Laterality: N/A;   MALONEY DILATION N/A 03/17/2017   Procedure: Elease Hashimoto DILATION;  Surgeon: [name], MD;  [facility];  Service: Endoscopy;  Laterality: N/A;   MALONEY DILATION N/A 09/18/2018   Procedure: Elease Hashimoto DILATION;  Surgeon: [name], MD;  [facility];  Service: Endoscopy;  Laterality: N/A;   MALONEY DILATION N/A 12/20/2019   Procedure: Elease Hashimoto DILATION;  Surgeon: [name], MD;  [facility];  Service: Endoscopy;  Laterality: N/A;   MALONEY DILATION N/A 05/12/2022   Procedure: Elease Hashimoto DILATION;  Surgeon: [name], MD;  [facility];  Service: Endoscopy;  Laterality: N/A;   MASS EXCISION N/A 07/23/2014    Procedure: EXCISIONAL BIOPSY NODULE LEFT PARACOCCYGEAL AREA;  Surgeon: Kerrin Champagne, MD;  Location[facility];  Service: Orthopedics;  Laterality: N/A;   MASS EXCISION Left 10/10/2015   Procedure: EXCISIONAL BIOPSY OF LEFT DORSORADIAL FOREARM MASS LIPOMA;  Surgeon: Kerrin Champagne, MD;  Location: [facility];  Service: Orthopedics;  Laterality: Left;   TUBAL LIGATION      Family History  Problem Relation Age of Onset   Diabetes Other    Heart disease Other        has pace maker   Hypertension Mother    Kidney disease Mother    Hypertension Father    Kidney disease Father    Heart disease Father    Cancer Father        leukemia   Hypertension Sister    Hypertension Sister    Colon cancer Neg Hx     Current Outpatient Medications on File Prior to Visit  Medication Sig Dispense Refill   acyclovir (ZOVIRAX) 400 MG tablet Take 1 tablet (400 mg total) by mouth 2 (two) times daily. 180 tablet 0   amitriptyline (ELAVIL) 150  MG tablet Take 1 tablet by mouth once daily 90 tablet 0   amLODipine (NORVASC) 10 MG tablet Take 1 tablet (10 mg total) by mouth daily. 90 tablet 1   atorvastatin (LIPITOR) 40 MG tablet Take 1 tablet (40 mg total) by mouth daily. Please make appointment with PCP to have your cholesterol checked 90 tablet 0   budesonide-formoterol (SYMBICORT) 160-4.5 MCG/ACT inhaler Inhale 2 puffs into the lungs 2 (two) times daily. 1 each 3   celecoxib (CELEBREX) 200 MG capsule TAKE 1 CAPSULE BY MOUTH ONCE DAILY AS NEEDED 60 capsule 1   DULoxetine (CYMBALTA) 60 MG capsule Take 1 capsule (60 mg total) by mouth 2 (two) times daily. 180 capsule 1   esomeprazole (NEXIUM) 40 MG capsule Take 1 capsule (40 mg total) by mouth daily after breakfast. 90 capsule 3   furosemide (LASIX) 20 MG tablet Take 1 tablet (20 mg total) by mouth daily as needed for edema. 90 tablet 3   hydrocortisone (ANUSOL-HC) 2.5 % rectal cream Place 1 Application rectally 2 (two) times daily. Up to 14 days at a  time. 30 g 0   linaclotide (LINZESS) 290 MCG CAPS capsule TAKE 1 CAPSULE BY MOUTH ONCE DAILY BEFORE BREAKFAST 30 capsule 11   pregabalin (LYRICA) 100 MG capsule Take 1 capsule (100 mg total) by mouth 2 (two) times daily. 60 capsule 2   sucralfate (CARAFATE) 1 GM/10ML suspension Take 10 mLs (1 g total) by mouth 4 (four) times daily -  with meals and at bedtime. 420 mL 0   No current facility-administered medications on file prior to visit.    Allergies  Allergen Reactions   Promethazine Hcl Nausea And Vomiting and Other (See Comments)    "Makes my head hurt really bad too"    Social History   Substance and Sexual Activity  Alcohol Use No    Social History   Tobacco Use  Smoking Status Former   Current packs/day: 0.00   Average packs/day: 1.5 packs/day for 25.0 years (37.5 ttl pk-yrs)   Types: Cigarettes   Start date: 04/07/1970   Quit date: 04/07/1995   Years since quitting: 27.6  Smokeless Tobacco Never    Review of Systems  Constitutional:  Positive for malaise/fatigue.  HENT: Negative.    Eyes:  Positive for blurred vision.  Respiratory:  Positive for shortness of breath and wheezing.   Cardiovascular: Negative.   Gastrointestinal:  Positive for nausea.  Genitourinary:  Positive for frequency.  Musculoskeletal:  Positive for back pain and joint pain.  Skin: Negative.   Neurological: Negative.   Endo/Heme/Allergies: Negative.   Psychiatric/Behavioral: Negative.      Objective   Vitals:   11/16/22 1037  BP: 114/73  Pulse: 79  Resp: 16  Temp: 98.4 F (36.9 C)  SpO2: 93%    Physical Exam Vitals reviewed.  Constitutional:      Appearance: Normal appearance. She is normal weight. She is not ill-appearing.  HENT:     Head: Normocephalic and atraumatic.  Cardiovascular:     Rate and Rhythm: Normal rate and regular rhythm.     Heart sounds: Murmur heard.     No friction rub. No gallop.     Comments: 2 out of 6 systolic ejection murmur. Pulmonary:      Effort: Pulmonary effort is normal. No respiratory distress.     Breath sounds: Normal breath sounds. No stridor. No wheezing, rhonchi or rales.  Abdominal:     General: There is no distension.  Palpations: Abdomen is soft. There is no mass.     Tenderness: There is no abdominal tenderness. There is no guarding or rebound.     Hernia: No hernia is present.  Genitourinary:    Comments: No significant external hemorrhoids present.  Vague internal hemorrhoid on digital examination, though this was limited secondary to patient discomfort.  No blood present.  Normal sphincter tone. Skin:    General: Skin is warm and dry.  Neurological:     Mental Status: She is alert and oriented to person, place, and time.   GI notes reviewed  Assessment  History of internal hemorrhoidal disease present.  No external hemorrhoids are present to warrant surgical excision. Plan  I told the patient that she should follow-up with gastroenterology for internal hemorrhoidal banding.  She does not need surgical intervention.  She understands and agrees.  Follow-up here as needed.

## 2022-11-30 ENCOUNTER — Ambulatory Visit (INDEPENDENT_AMBULATORY_CARE_PROVIDER_SITE_OTHER): Payer: No Typology Code available for payment source | Admitting: Gastroenterology

## 2022-11-30 ENCOUNTER — Encounter: Payer: Self-pay | Admitting: Gastroenterology

## 2022-11-30 VITALS — BP 134/80 | HR 80 | Temp 97.8°F | Ht 66.0 in | Wt 164.6 lb

## 2022-11-30 DIAGNOSIS — K642 Third degree hemorrhoids: Secondary | ICD-10-CM

## 2022-11-30 DIAGNOSIS — K641 Second degree hemorrhoids: Secondary | ICD-10-CM

## 2022-11-30 DIAGNOSIS — R748 Abnormal levels of other serum enzymes: Secondary | ICD-10-CM

## 2022-11-30 NOTE — Progress Notes (Signed)
   [facility] Banding Procedure Note:   [name] is a 62 y.o. female presenting today for consideration of hemorrhoid banding. Last colonoscopy August 2019 with grade 3 internal hemorrhoids and external hemorrhoids. .  Latex Allergy: no  Interval History: [name] any pain or bleeding. Has some occasional itching. Denies any recent issues with constipation. States she is going to have blood work done in 3 months to recheck her LFTs.   The patient presents with symptomatic grade 2/3 hemorrhoids, unresponsive to maximal medical therapy, requesting rubber band ligation of his/her hemorrhoidal disease. All risks, benefits, and alternative forms of therapy were described and informed consent was obtained.  The decision was made to band the left lateral internal hemorrhoid, and the [facility] O'Regan System was used to perform band ligation without complication. Digital anorectal examination was then performed to assure proper positioning of the band, and to adjust the banded tissue as required. The patient was discharged home without pain or other issues. Dietary and behavioral recommendations were given and (if necessary prescriptions were given), along with follow-up instructions. The patient will return in 2 months for follow up.   No complications were encountered and the patient tolerated the procedure well.    [name], MSN, FNP-BC, AGACNP-BC [facility]

## 2022-11-30 NOTE — Patient Instructions (Signed)
Continue to avoid straining. Limit toilet time to 2-3 minutes at the most.   Avoid constipation. Take 2 tablespoons of natural wheat bran, natural oat bran, flax, Benefiber or any over the counter fiber supplement and increase your water intake to 7-8 glasses daily.  Occasionally, you may have more bleeding than usual after the banding procedure. This is often from the untreated hemorrhoids rather than the treated one. Don't be concerned if there is a tablespoon or so of blood. If there is more blood than this, lie flat with your bottom higher than your head and apply an ice pack to the area. If the bleeding does not stop within a half an hour or if you feel faint, have severe pain, chills, fever or difficulty passing urine (very rare) or other problems, you should call us at [phone] or report to the nearest emergency room. Please call me with any concerns!  The procedure you have had should have been relatively painless since the banding of the area involved does not have nerve endings and there is no pain sensation. The rubber band cuts off the blood supply to the hemorrhoid and the band may fall off as soon as 48 hours after the banding (the band may occasionally be seen in the toilet bowl following a bowel movement). You may notice a temporary feeling of fullness in the rectum which should respond adequately to plain Tylenol or Motrin.  Follow up in 3 months. Please complete your blood work as ordered by Tana [place].    [name], MSN, FNP-BC, AGACNP-BC [facility]

## 2022-12-04 ENCOUNTER — Other Ambulatory Visit: Payer: Self-pay | Admitting: Family Medicine

## 2022-12-04 DIAGNOSIS — E7800 Pure hypercholesterolemia, unspecified: Secondary | ICD-10-CM

## 2022-12-06 ENCOUNTER — Other Ambulatory Visit: Payer: Self-pay | Admitting: Family Medicine

## 2022-12-06 DIAGNOSIS — A609 Anogenital herpesviral infection, unspecified: Secondary | ICD-10-CM

## 2022-12-08 MED ORDER — PREGABALIN 100 MG PO CAPS: 100.0000 mg | ORAL_CAPSULE | Freq: Two times a day (BID) | ORAL | 0 refills | Status: DC

## 2022-12-08 NOTE — Telephone Encounter (Signed)
Patient calls nurse line checking status of medication refills.   Will forward to PCP.

## 2022-12-14 ENCOUNTER — Other Ambulatory Visit: Payer: Self-pay | Admitting: Nurse Practitioner

## 2023-01-03 ENCOUNTER — Encounter: Payer: Self-pay | Admitting: Gastroenterology

## 2023-01-05 ENCOUNTER — Other Ambulatory Visit: Payer: Self-pay | Admitting: Family Medicine

## 2023-01-15 ENCOUNTER — Other Ambulatory Visit: Payer: Self-pay | Admitting: Cardiology

## 2023-01-22 ENCOUNTER — Other Ambulatory Visit: Payer: Self-pay | Admitting: Family Medicine

## 2023-01-22 DIAGNOSIS — F339 Major depressive disorder, recurrent, unspecified: Secondary | ICD-10-CM

## 2023-01-24 ENCOUNTER — Other Ambulatory Visit: Payer: Self-pay | Admitting: Family Medicine

## 2023-01-26 ENCOUNTER — Other Ambulatory Visit: Payer: Self-pay

## 2023-01-26 DIAGNOSIS — R748 Abnormal levels of other serum enzymes: Secondary | ICD-10-CM

## 2023-01-28 DIAGNOSIS — M79642 Pain in left hand: Secondary | ICD-10-CM | POA: Diagnosis not present

## 2023-02-02 ENCOUNTER — Other Ambulatory Visit: Payer: Self-pay | Admitting: Family Medicine

## 2023-02-15 ENCOUNTER — Other Ambulatory Visit: Payer: Self-pay | Admitting: Cardiology

## 2023-02-15 ENCOUNTER — Encounter: Payer: Self-pay | Admitting: Cardiology

## 2023-02-18 ENCOUNTER — Other Ambulatory Visit: Payer: Self-pay | Admitting: Family Medicine

## 2023-02-18 DIAGNOSIS — M48062 Spinal stenosis, lumbar region with neurogenic claudication: Secondary | ICD-10-CM

## 2023-02-21 DIAGNOSIS — R748 Abnormal levels of other serum enzymes: Secondary | ICD-10-CM | POA: Diagnosis not present

## 2023-02-23 ENCOUNTER — Other Ambulatory Visit: Payer: Self-pay | Admitting: Family Medicine

## 2023-02-23 DIAGNOSIS — M48062 Spinal stenosis, lumbar region with neurogenic claudication: Secondary | ICD-10-CM

## 2023-03-01 NOTE — Progress Notes (Unsigned)
GI Office Note    Referring Provider: Lincoln Brigham, MD Primary Care Physician:  Lincoln Brigham, MD  Primary Gastroenterologist: Roetta Sessions, MD   Chief Complaint   No chief complaint on file.   History of Present Illness   [name] is a 63 y.o. female presenting today for follow up. H/o GERD, chronic constipation, chronic abdominal pain,  Labs 10/2022:***  Labs 02/2023:***  CT A/P with contrast 08/2022: IMPRESSION: 1. Gas fluid levels in the proximal colon, nonspecific but can be seen in the setting of diarrheal illness or recent laxative use. 2. Prominent gastrohepatic ligament lymph nodes measure up to 7 mm in short axis, nonspecific but likely reactive.  Colonoscopy August 2019: -Grade 3 internal hemorrhoids -External hemorrhoids -Repeat colonoscopy in 10 years   Last EGD 02/27/2021 -mild Schatzki's ring s/p dilation and biopsy, gastritis s/p biopsy, normal duodenum.  Pathology revealing reactive gastropathy. Prior EGD Dec 2021 also with Schatzki ring and dilation.   EGD 04/2022: -normal esophagus. Dilated and biopsy. Reflux esophagitis. -query dilation of a cervical web occurred with passed of maloney dilator.     Medications   Current Outpatient Medications  Medication Sig Dispense Refill   acyclovir (ZOVIRAX) 400 MG tablet Take 1 tablet by mouth twice daily 180 tablet 0   amitriptyline (ELAVIL) 150 MG tablet Take 1 tablet by mouth once daily 90 tablet 0   amLODipine (NORVASC) 10 MG tablet TAKE 1 TABLET BY MOUTH ONCE DAILY . APPOINTMENT REQUIRED FOR FUTURE REFILLS STOP  DILTIAZEM 14 tablet 0   atorvastatin (LIPITOR) 40 MG tablet TAKE 1 TABLET BY MOUTH ONCE DAILY. NEEDS APPOINTMENT WITH PCP TO HAVE CHOLESTEROL CHECKED. 90 tablet 0   budesonide-formoterol (SYMBICORT) 160-4.5 MCG/ACT inhaler Inhale 2 puffs into the lungs 2 (two) times daily. 1 each 3   celecoxib (CELEBREX) 200 MG capsule TAKE 1 CAPSULE BY MOUTH ONCE DAILY AS NEEDED 60 capsule 0   DULoxetine  (CYMBALTA) 60 MG capsule Take 1 capsule by mouth twice daily 180 capsule 0   esomeprazole (NEXIUM) 40 MG capsule Take 1 capsule (40 mg total) by mouth daily after breakfast. 90 capsule 3   furosemide (LASIX) 20 MG tablet Take 1 tablet (20 mg total) by mouth daily as needed for edema. 90 tablet 3   hydrocortisone (ANUSOL-HC) 2.5 % rectal cream Place 1 Application rectally 2 (two) times daily. Up to 14 days at a time. 30 g 0   linaclotide (LINZESS) 290 MCG CAPS capsule TAKE 1 CAPSULE BY MOUTH ONCE DAILY BEFORE BREAKFAST 30 capsule 11   pregabalin (LYRICA) 100 MG capsule Take 1 capsule by mouth twice daily 60 capsule 0   sucralfate (CARAFATE) 1 GM/10ML suspension Take 10 mLs (1 g total) by mouth 4 (four) times daily -  with meals and at bedtime. 420 mL 0   No current facility-administered medications for this visit.    Allergies   Allergies as of 03/02/2023 - Review Complete 11/30/2022  Allergen Reaction Noted   Promethazine hcl Nausea And Vomiting and Other (See Comments) 07/26/2008     Past Medical History   Past Medical History:  Diagnosis Date   Anxiety    Aortic insufficiency    a. 10/2017 Echo: Mod AI; b. 04/2020 Echo: Mod AI.   Asthma    every now and then.  Wheezing and coughing   Cancer (HCC) 1997   left lung   Chronic abdominal pain    Chronic back pain    Chronic joint pain  Depression    Diastolic dysfunction    a. 10/2017 Echo: EF 60-65%, GrII DD, mod AI; b. 04/2020 Echo: EF 60-65%, no rwma, GrI DD, nl RV fxn, mod AI.   Essential hypertension    GERD (gastroesophageal reflux disease)    HLD (hyperlipidemia)    Palpitations 01/2013   a. 11/2017 Holter: Occas PACs, rare PVCs. Rare nonsustained atrial tachycardia; b. 01/2018 Event monitor: Predominantly sinus rhythm @ 90 (64-135). No significant arrhythmias.   Shortness of breath dyspnea    with exertion    Past Surgical History   Past Surgical History:  Procedure Laterality Date   ABDOMINAL HYSTERECTOMY      ABDOMINAL SURGERY     ovarian cyst removal   BALLOON DILATION N/A 02/27/2021   Procedure: BALLOON DILATION;  Surgeon: [name], DO;  [facility]: [facility];  Service: Endoscopy;  Laterality: N/A;   BIOPSY N/A 11/22/2013   Procedure: GASTRIC BIOPSY;  Surgeon: [name], MD;  Location: [facility];  Service: Endoscopy;  Laterality: N/A;   BIOPSY  02/27/2021   Procedure: BIOPSY;  Surgeon: [name], DO;  [facility];  Service: Endoscopy;;   BIOPSY  05/12/2022   Procedure: BIOPSY;  Surgeon: [name], MD;  [facility]: [facility];  Service: Endoscopy;;   BLADDER SURGERY  suspension x4   x 4   CARPAL TUNNEL RELEASE Right 07/11/2015   Procedure: RIGHT CARPAL TUNNEL RELEASE;  Surgeon: Kerrin Champagne, MD;  Location: [facility];  Service: Orthopedics;  Laterality: Right;   CARPAL TUNNEL RELEASE Left 05/19/2022   Procedure: CARPAL TUNNEL RELEASE;  Surgeon: Marlyne Beards, MD;  Location:  SUR[facility]ERY CENTER;  Service: Orthopedics;  Laterality: Left;  or MAC with regional 60   CHOLECYSTECTOMY N/A 04/05/2014   Procedure: LAPAROSCOPIC CHOLECYSTECTOMY;  Surgeon: Franky Macho Md, MD;  Location: [facility];  Service: General;  Laterality: N/A;   COCCYGECTOMY N/A 04/11/2015   Procedure: COCCYGECTOMY WITH OSTEOTOMY THROUGH AREA OF DEFORMITY;  Surgeon: Kerrin Champagne, MD;  Location: [facility];  Service: Orthopedics;  Laterality: N/A;   COLONOSCOPY     COLONOSCOPY WITH PROPOFOL N/A 11/22/2013   Dr. Jena Gauss: Grade 3 and 4/internal hemorrhoids?"likely source of hematochezia Normal colonoscopy otherwise.    COLONOSCOPY WITH PROPOFOL N/A 09/15/2017   Dr. Lovena Neighbours: Hemorrhoids, next colonoscopy 10 years   ESOPHAGOGASTRODUODENOSCOPY (EGD) WITH PROPOFOL N/A 11/22/2013   Dr. Jena Gauss: Incomplete Schatzki's ring dilated and disrupted as described above.  Small hiatal hernia. Focally abnormal gastric mucosa of uncertain significance status post biopsy, reactive gastropathy,  negative H.pylori   ESOPHAGOGASTRODUODENOSCOPY (EGD) WITH PROPOFOL N/A 08/04/2015   Dr. Jena Gauss: mild Schatzki's ring s/p dilation, small hiatal hernia    ESOPHAGOGASTRODUODENOSCOPY (EGD) WITH PROPOFOL N/A 03/17/2017   Mild Schatki's ring s/p dilation, small hiatal hernia, otherwise normal   ESOPHAGOGASTRODUODENOSCOPY (EGD) WITH PROPOFOL N/A 09/18/2018   Dr. Jena Gauss: Schatzki ring status post dilation and disruption.  Hiatal hernia.   ESOPHAGOGASTRODUODENOSCOPY (EGD) WITH PROPOFOL N/A 12/20/2019   non-obstructing Schatzki's ring s/p dilation with 56, 58, and 60 F.   ESOPHAGOGASTRODUODENOSCOPY (EGD) WITH PROPOFOL N/A 02/27/2021   Procedure: ESOPHAGOGASTRODUODENOSCOPY (EGD) WITH PROPOFOL;  Surgeon: [name], DO;  [facility];  Service: Endoscopy;  Laterality: N/A;  11:30am   ESOPHAGOGASTRODUODENOSCOPY (EGD) WITH PROPOFOL N/A 05/12/2022   Procedure: ESOPHAGOGASTRODUODENOSCOPY (EGD) WITH PROPOFOL;  Surgeon: [name], MD;  [facility];  Service: Endoscopy;  Laterality: N/A;  8:30 am   EXCISION VAGINAL  CYST Left 06/20/2012   Procedure: EXCISION LEFT LABIAL CYST;  Surgeon: [name], MD;  Location: [facility];  Service: Gynecology;  Laterality: Left;   GANGLION CYST EXCISION Left 05/19/2022   Procedure: left volar carpal ganglion cyst excision;  Surgeon: Marlyne Beards, MD;  Location: [facility];  Service: Orthopedics;  Laterality: Left;  or MAC with regional 60   LOBECTOMY Left    LUNG CANCER SURGERY  1997   age 32, resection only   MALONEY DILATION N/A 11/22/2013   Procedure: MALONEY DILATION;  Surgeon: [name], MD;  Location: [facility];  Service: Endoscopy;  Laterality: N/A;  56   MALONEY DILATION N/A 08/04/2015   Procedure: Elease Hashimoto DILATION;  Surgeon: [name], MD;  [facility];  Service: Endoscopy;  Laterality: N/A;   MALONEY DILATION N/A 03/17/2017   Procedure: Elease Hashimoto DILATION;  Surgeon: [name], MD;   [facility];  Service: Endoscopy;  Laterality: N/A;   MALONEY DILATION N/A 09/18/2018   Procedure: Elease Hashimoto DILATION;  Surgeon: [name], MD;  [facility];  Service: Endoscopy;  Laterality: N/A;   MALONEY DILATION N/A 12/20/2019   Procedure: Elease Hashimoto DILATION;  Surgeon: [name], MD;  [facility];  Service: Endoscopy;  Laterality: N/A;   MALONEY DILATION N/A 05/12/2022   Procedure: Elease Hashimoto DILATION;  Surgeon: [name], MD;  [facility];  Service: Endoscopy;  Laterality: N/A;   MASS EXCISION N/A 07/23/2014   Procedure: EXCISIONAL BIOPSY NODULE LEFT PARACOCCYGEAL AREA;  Surgeon: Kerrin Champagne, MD;  Location[facility];  Service: Orthopedics;  Laterality: N/A;   MASS EXCISION Left 10/10/2015   Procedure: EXCISIONAL BIOPSY OF LEFT DORSORADIAL FOREARM MASS LIPOMA;  Surgeon: Kerrin Champagne, MD;  Location: [facility];  Service: Orthopedics;  Laterality: Left;   TUBAL LIGATION      Past Family History   Family History  Problem Relation Age of Onset   Diabetes Other    Heart disease Other        has pace maker   Hypertension Mother    Kidney disease Mother    Hypertension Father    Kidney disease Father    Heart disease Father    Cancer Father        leukemia   Hypertension Sister    Hypertension Sister    Colon cancer Neg Hx     Past Social History   Social History   Socioeconomic History   Marital status: Married    Spouse name: Carlo   Number of children: 3   Years of education: Not on file   Highest education level: Not on file  Occupational History   Occupation: Disability  Tobacco Use   Smoking status: Former    Current packs/day: 0.00    Average packs/day: 1.5 packs/day for 25.0 years (37.5 ttl pk-yrs)    Types: Cigarettes    Start date: 04/07/1970    Quit date: 04/07/1995    Years since quitting: 27.9   Smokeless tobacco: Never  Vaping Use   Vaping status: Never Used  Substance and Sexual  Activity   Alcohol use: No   Drug use: No   Sexual activity: Not Currently    Birth control/protection: Surgical    Comment: hyst  Other Topics Concern   Not on file  Social History Narrative   Not on file   Social Drivers of Health   Financial Resource Strain: Low  Risk  (04/13/2022)   Overall Financial Resource Strain (CARDIA)    Difficulty of Paying Living Expenses: Not hard at all  Food Insecurity: No Food Insecurity (04/13/2022)   Hunger Vital Sign    Worried About Running Out of Food in the Last Year: Never true    Ran Out of Food in the Last Year: Never true  Transportation Needs: No Transportation Needs (04/13/2022)   PRAPARE - Administrator, Civil Service (Medical): No    Lack of Transportation (Non-Medical): No  Physical Activity: Insufficiently Active (04/21/2022)   Exercise Vital Sign    Days of Exercise per Week: 3 days    Minutes of Exercise per Session: 10 min  Stress: Stress Concern Present (04/21/2022)   [employer] of Occupational Health - Occupational Stress Questionnaire    Feeling of Stress : To some extent  Social Connections: Moderately Isolated (04/13/2022)   Social Connection and Isolation Panel [NHANES]    Frequency of Communication with Friends and Family: More than three times a week    Frequency of Social Gatherings with Friends and Family: Three times a week    Attends Religious Services: Never    Active Member of Clubs or Organizations: No    Attends Banker Meetings: Never    Marital Status: Married  Catering manager Violence: Not At Risk (04/13/2022)   Humiliation, Afraid, Rape, and Kick questionnaire    Fear of Current or Ex-Partner: No    Emotionally Abused: No    Physically Abused: No    Sexually Abused: No    Review of Systems   General: Negative for anorexia, weight loss, fever, chills, fatigue, weakness. ENT: Negative for hoarseness, difficulty swallowing , nasal congestion. CV: Negative for chest pain,  angina, palpitations, dyspnea on exertion, peripheral edema.  Respiratory: Negative for dyspnea at rest, dyspnea on exertion, cough, sputum, wheezing.  GI: See history of present illness. GU:  Negative for dysuria, hematuria, urinary incontinence, urinary frequency, nocturnal urination.  Endo: Negative for unusual weight change.     Physical Exam   There were no vitals taken for this visit.   General: Well-nourished, well-developed in no acute distress.  Eyes: No icterus. Mouth: Oropharyngeal mucosa moist and pink , no lesions erythema or exudate. Lungs: Clear to auscultation bilaterally.  Heart: Regular rate and rhythm, no murmurs rubs or gallops.  Abdomen: Bowel sounds are normal, nontender, nondistended, no hepatosplenomegaly or masses,  no abdominal bruits or hernia , no rebound or guarding.  Rectal: ***  Extremities: No lower extremity edema. No clubbing or deformities. Neuro: Alert and oriented x 4   Skin: Warm and dry, no jaundice.   Psych: Alert and cooperative, normal mood and affect.  Labs   *** Imaging Studies   No results found.  Assessment       PLAN   ***   Leanna Battles. Melvyn Neth, MHS, PA-C [facility]

## 2023-03-02 ENCOUNTER — Encounter: Payer: Self-pay | Admitting: Gastroenterology

## 2023-03-02 ENCOUNTER — Telehealth: Payer: Self-pay | Admitting: *Deleted

## 2023-03-02 ENCOUNTER — Telehealth (INDEPENDENT_AMBULATORY_CARE_PROVIDER_SITE_OTHER): Payer: No Typology Code available for payment source | Admitting: Gastroenterology

## 2023-03-02 ENCOUNTER — Encounter: Payer: Self-pay | Admitting: *Deleted

## 2023-03-02 VITALS — Ht 64.0 in | Wt 160.0 lb

## 2023-03-02 DIAGNOSIS — R748 Abnormal levels of other serum enzymes: Secondary | ICD-10-CM

## 2023-03-02 DIAGNOSIS — K219 Gastro-esophageal reflux disease without esophagitis: Secondary | ICD-10-CM | POA: Diagnosis not present

## 2023-03-02 DIAGNOSIS — R1010 Upper abdominal pain, unspecified: Secondary | ICD-10-CM | POA: Diagnosis not present

## 2023-03-02 DIAGNOSIS — R1319 Other dysphagia: Secondary | ICD-10-CM

## 2023-03-02 DIAGNOSIS — K5900 Constipation, unspecified: Secondary | ICD-10-CM

## 2023-03-02 DIAGNOSIS — R1310 Dysphagia, unspecified: Secondary | ICD-10-CM | POA: Diagnosis not present

## 2023-03-02 NOTE — Patient Instructions (Signed)
Increase your esomeprazole to 40mg  before breakfast and evening meal.  We will be in touch with lab results as available. We will follow up on what endocrinologist has to say about the elevated alkaline phosphatase, it may be bone related. Upper endoscopy to be scheduled in near future.

## 2023-03-02 NOTE — Telephone Encounter (Signed)
Pt called in and needed to change time for procedure to a little later in the morning. She has been moved down to 11:30am.

## 2023-03-02 NOTE — Progress Notes (Signed)
Primary Care Physician:  Lincoln Brigham, MD Primary GI:  Roetta Sessions, MD Patient Location: Home Provider Location: [facility] office  Reason for Visit:  Chief Complaint  Patient presents with   Follow-up    States that she is still having abdominal pain and that food is starting to feel like it is getting stuck again.    Persons present on the virtual encounter, with roles: Patient, myself (provider),Tammy Clifton, CMA(updated meds and allergies)  Total time (minutes) spent on medical discussion: 15  Due to COVID-19, visit was conducted using Mychart video method.  Visit was requested by patient.  Virtual Visit via Mychart video  I connected with Judi Cong on 03/02/23 at 11:30 AM EST by Mychart video and verified that I am speaking with the correct person using two identifiers.   I discussed the limitations, risks, security and privacy concerns of performing an evaluation and management service by telephone/video and the availability of in person appointments. I also discussed with the patient that there may be a patient responsible charge related to this service. The patient expressed understanding and agreed to proceed.   HPI:   [name] is a 63 y.o. female who presents for virtual visit for follow up. H/o GERD, chronic constipation, chronic abdominal pain.  Since her last office visit she completed hemorrhoid banding with [name], NP.  She has completed banding for internal hemorrhoids.  Overall doing much better.  Sometimes if she has to strain she notes that hemorrhoid comes out.  No further bleeding.  She was seen by general surgery, Dr. Lovell Sheehan, regarding external hemorrhoids, however he did not feel like she had any external hemorrhoid disease that warranted surgical excision.  She continues to wait to see endocrinology r possible bone source of elevated alkaline phosphatase, scheduled for next month.  Complaining of abdominal pain most days.  She feels like  her nerves aggravate her pain somewhat.  She has had increased stress with recent loss of her mother-in-law.  Pain is in the upper abdomen.  Sometimes worse with food.  Going on for the past few months fairly consistently.  Sometimes wakes up due to abdominal pain.  Has not determined any alleviating factors.  Has a lot of belching.  No heartburn.  Some nausea but no vomiting.  Complains of dysphagia to solid foods and pills.  She moves her bowels most days.  No melena or rectal bleeding.  Continues to take Linzess daily.  Labs back in August revealed elevated alkaline phosphatase.  Liver was unremarkable on CT scan with abdomen.  We updated labs in October, GGT was normal, AMA negative, alk phos isoenzymes within normal limits.  See below.  She had labs done couple of weeks ago, PBC profile is pending.  Her alk phos remains elevated at 185.  Chronically on Celebrex for arthritis.  She states she has pain involving most of her joints, hands and fingers with some deformity, involvement in the knees, hips, back.  Never seen by rheumatology.   Labs 10/2022: Total bilirubin 0.4, alkaline phosphatase 177, AST 15, ALT 11, GGT 33, AMA less than 20, alkaline phosphatase isoenzymes with 57% liver, 42% bone, 1% intestinal.  Labs 02/2023: ANA positive (1-160 titer homogenous pattern), antimitochondrial antibody less than 1.20, antimitochondrial M2 antibody less than 20, anti-SP 100 less than 20, anti-smooth muscle antibody 1-40 elevated, anti-GP 210 pending, total bilirubin 0.3, alk phos 185, AST 8, ALT 10,  CT A/P with contrast 08/2022: IMPRESSION: 1. Gas  fluid levels in the proximal colon, nonspecific but can be seen in the setting of diarrheal illness or recent laxative use. 2. Prominent gastrohepatic ligament lymph nodes measure up to 7 mm in short axis, nonspecific but likely reactive.  Colonoscopy August 2019: -Grade 3 internal hemorrhoids -External hemorrhoids -Repeat colonoscopy in 10 years   Last  EGD 02/27/2021 -mild Schatzki's ring s/p dilation and biopsy, gastritis s/p biopsy, normal duodenum.  Pathology revealing reactive gastropathy. Prior EGD Dec 2021 also with Schatzki ring and dilation.   EGD 04/2022: -normal esophagus. Dilated and biopsy. Reflux esophagitis. -query dilation of a cervical web occurred with passed of maloney dilator.    Wt Readings from Last 3 Encounters:  03/02/23 160 lb (72.6 kg)  11/30/22 164 lb 9.6 oz (74.7 kg)  11/16/22 161 lb (73 kg)     Current Outpatient Medications  Medication Sig Dispense Refill   acyclovir (ZOVIRAX) 400 MG tablet Take 1 tablet by mouth twice daily 180 tablet 0   amitriptyline (ELAVIL) 150 MG tablet Take 1 tablet by mouth once daily 90 tablet 0   amLODipine (NORVASC) 10 MG tablet TAKE 1 TABLET BY MOUTH ONCE DAILY . APPOINTMENT REQUIRED FOR FUTURE REFILLS STOP  DILTIAZEM 14 tablet 0   atorvastatin (LIPITOR) 40 MG tablet TAKE 1 TABLET BY MOUTH ONCE DAILY. NEEDS APPOINTMENT WITH PCP TO HAVE CHOLESTEROL CHECKED. 90 tablet 0   budesonide-formoterol (SYMBICORT) 160-4.5 MCG/ACT inhaler Inhale 2 puffs into the lungs 2 (two) times daily. 1 each 3   celecoxib (CELEBREX) 200 MG capsule TAKE 1 CAPSULE BY MOUTH ONCE DAILY AS NEEDED (Patient taking differently: Take 200 mg by mouth daily.) 60 capsule 0   DULoxetine (CYMBALTA) 60 MG capsule Take 1 capsule by mouth twice daily 180 capsule 0   esomeprazole (NEXIUM) 40 MG capsule Take 1 capsule (40 mg total) by mouth daily after breakfast. 90 capsule 3   furosemide (LASIX) 20 MG tablet Take 1 tablet (20 mg total) by mouth daily as needed for edema. 90 tablet 3   hydrocortisone (ANUSOL-HC) 2.5 % rectal cream Place 1 Application rectally 2 (two) times daily. Up to 14 days at a time. 30 g 0   linaclotide (LINZESS) 290 MCG CAPS capsule TAKE 1 CAPSULE BY MOUTH ONCE DAILY BEFORE BREAKFAST 30 capsule 11   pregabalin (LYRICA) 100 MG capsule Take 1 capsule by mouth twice daily 60 capsule 0   No current  facility-administered medications for this visit.    Past Medical History:  Diagnosis Date   Anxiety    Aortic insufficiency    a. 10/2017 Echo: Mod AI; b. 04/2020 Echo: Mod AI.   Asthma    every now and then.  Wheezing and coughing   Cancer (HCC) 1997   left lung   Chronic abdominal pain    Chronic back pain    Chronic joint pain    Depression    Diastolic dysfunction    a. 10/2017 Echo: EF 60-65%, GrII DD, mod AI; b. 04/2020 Echo: EF 60-65%, no rwma, GrI DD, nl RV fxn, mod AI.   Essential hypertension    GERD (gastroesophageal reflux disease)    HLD (hyperlipidemia)    Palpitations 01/2013   a. 11/2017 Holter: Occas PACs, rare PVCs. Rare nonsustained atrial tachycardia; b. 01/2018 Event monitor: Predominantly sinus rhythm @ 90 (64-135). No significant arrhythmias.   Shortness of breath dyspnea    with exertion    Past Surgical History:  Procedure Laterality Date   ABDOMINAL HYSTERECTOMY     ABDOMINAL  SURGERY     ovarian cyst removal   BALLOON DILATION N/A 02/27/2021   Procedure: BALLOON DILATION;  Surgeon: [name], DO;  [facility];  Service: Endoscopy;  Laterality: N/A;   BIOPSY N/A 11/22/2013   Procedure: GASTRIC BIOPSY;  Surgeon: [name], MD;  Location: [facility];  Service: Endoscopy;  Laterality: N/A;   BIOPSY  02/27/2021   Procedure: BIOPSY;  Surgeon: [name], DO;  [facility];  Service: Endoscopy;;   BIOPSY  05/12/2022   Procedure: BIOPSY;  Surgeon: [name], MD;  [facility];  Service: Endoscopy;;   BLADDER SURGERY  suspension x4   x 4   CARPAL TUNNEL RELEASE Right 07/11/2015   Procedure: RIGHT CARPAL TUNNEL RELEASE;  Surgeon: Kerrin Champagne, MD;  Location: [facility];  Service: Orthopedics;  Laterality: Right;   CARPAL TUNNEL RELEASE Left 05/19/2022   Procedure: CARPAL TUNNEL RELEASE;  Surgeon: Marlyne Beards, MD;  Location: [facility];  Service: Orthopedics;  Laterality:  Left;  or MAC with regional 60   CHOLECYSTECTOMY N/A 04/05/2014   Procedure: LAPAROSCOPIC CHOLECYSTECTOMY;  Surgeon: Franky Macho Md, MD;  Location: [facility];  Service: General;  Laterality: N/A;   COCCYGECTOMY N/A 04/11/2015   Procedure: COCCYGECTOMY WITH OSTEOTOMY THROUGH AREA OF DEFORMITY;  Surgeon: Kerrin Champagne, MD;  Location: [facility];  Service: Orthopedics;  Laterality: N/A;   COLONOSCOPY     COLONOSCOPY WITH PROPOFOL N/A 11/22/2013   Dr. Jena Gauss: Grade 3 and 4/internal hemorrhoids?"likely source of hematochezia Normal colonoscopy otherwise.    COLONOSCOPY WITH PROPOFOL N/A 09/15/2017   Dr. Lovena Neighbours: Hemorrhoids, next colonoscopy 10 years   ESOPHAGOGASTRODUODENOSCOPY (EGD) WITH PROPOFOL N/A 11/22/2013   Dr. Jena Gauss: Incomplete Schatzki's ring dilated and disrupted as described above.  Small hiatal hernia. Focally abnormal gastric mucosa of uncertain significance status post biopsy, reactive gastropathy, negative H.pylori   ESOPHAGOGASTRODUODENOSCOPY (EGD) WITH PROPOFOL N/A 08/04/2015   Dr. Jena Gauss: mild Schatzki's ring s/p dilation, small hiatal hernia    ESOPHAGOGASTRODUODENOSCOPY (EGD) WITH PROPOFOL N/A 03/17/2017   Mild Schatki's ring s/p dilation, small hiatal hernia, otherwise normal   ESOPHAGOGASTRODUODENOSCOPY (EGD) WITH PROPOFOL N/A 09/18/2018   Dr. Jena Gauss: Schatzki ring status post dilation and disruption.  Hiatal hernia.   ESOPHAGOGASTRODUODENOSCOPY (EGD) WITH PROPOFOL N/A 12/20/2019   non-obstructing Schatzki's ring s/p dilation with 56, 58, and 60 F.   ESOPHAGOGASTRODUODENOSCOPY (EGD) WITH PROPOFOL N/A 02/27/2021   Procedure: ESOPHAGOGASTRODUODENOSCOPY (EGD) WITH PROPOFOL;  Surgeon: [name], DO;  [facility];  Service: Endoscopy;  Laterality: N/A;  11:30am   ESOPHAGOGASTRODUODENOSCOPY (EGD) WITH PROPOFOL N/A 05/12/2022   Procedure: ESOPHAGOGASTRODUODENOSCOPY (EGD) WITH PROPOFOL;  Surgeon: [name], MD;  [facility];  Service: Endoscopy;   Laterality: N/A;  8:30 am   EXCISION VAGINAL CYST Left 06/20/2012   Procedure: EXCISION LEFT LABIAL CYST;  Surgeon: [name], MD;  Location: [facility];  Service: Gynecology;  Laterality: Left;   GANGLION CYST EXCISION Left 05/19/2022   Procedure: left volar carpal ganglion cyst excision;  Surgeon: Marlyne Beards, MD;  Location: [facility];  Service: Orthopedics;  Laterality: Left;  or MAC with regional 60   LOBECTOMY Left    LUNG CANCER SURGERY  1997   age 4, resection only   MALONEY DILATION N/A 11/22/2013   Procedure: MALONEY DILATION;  Surgeon: [name], MD;  Location: [facility];  Service: Endoscopy;  Laterality: N/A;  56  MALONEY DILATION N/A 08/04/2015   Procedure: Elease Hashimoto DILATION;  Surgeon: [name], MD;  [facility];  Service: Endoscopy;  Laterality: N/A;   MALONEY DILATION N/A 03/17/2017   Procedure: Elease Hashimoto DILATION;  Surgeon: [name], MD;  [facility];  Service: Endoscopy;  Laterality: N/A;   MALONEY DILATION N/A 09/18/2018   Procedure: Elease Hashimoto DILATION;  Surgeon: [name], MD;  [facility];  Service: Endoscopy;  Laterality: N/A;   MALONEY DILATION N/A 12/20/2019   Procedure: Elease Hashimoto DILATION;  Surgeon: [name], MD;  [facility];  Service: Endoscopy;  Laterality: N/A;   MALONEY DILATION N/A 05/12/2022   Procedure: Elease Hashimoto DILATION;  Surgeon: [name], MD;  [facility];  Service: Endoscopy;  Laterality: N/A;   MASS EXCISION N/A 07/23/2014   Procedure: EXCISIONAL BIOPSY NODULE LEFT PARACOCCYGEAL AREA;  Surgeon: Kerrin Champagne, MD;  Location: [facility];  Service: Orthopedics;  Laterality: N/A;   MASS EXCISION Left 10/10/2015   Procedure: EXCISIONAL BIOPSY OF LEFT DORSORADIAL FOREARM MASS LIPOMA;  Surgeon: Kerrin Champagne, MD;  Location: [facility];  Service: Orthopedics;  Laterality: Left;   TUBAL LIGATION      Family History  Problem Relation Age of  Onset   Diabetes Other    Heart disease Other        has pace maker   Hypertension Mother    Kidney disease Mother    Hypertension Father    Kidney disease Father    Heart disease Father    Cancer Father        leukemia   Hypertension Sister    Hypertension Sister    Colon cancer Neg Hx     Social History   Socioeconomic History   Marital status: Married    Spouse name: Carlo   Number of children: 3   Years of education: Not on file   Highest education level: Not on file  Occupational History   Occupation: Disability  Tobacco Use   Smoking status: Former    Current packs/day: 0.00    Average packs/day: 1.5 packs/day for 25.0 years (37.5 ttl pk-yrs)    Types: Cigarettes    Start date: 04/07/1970    Quit date: 04/07/1995    Years since quitting: 27.9   Smokeless tobacco: Never  Vaping Use   Vaping status: Never Used  Substance and Sexual Activity   Alcohol use: No   Drug use: No   Sexual activity: Not Currently    Birth control/protection: Surgical    Comment: hyst  Other Topics Concern   Not on file  Social History Narrative   Not on file   Social Drivers of Health   Financial Resource Strain: Low Risk  (04/13/2022)   Overall Financial Resource Strain (CARDIA)    Difficulty of Paying Living Expenses: Not hard at all  Food Insecurity: No Food Insecurity (04/13/2022)   Hunger Vital Sign    Worried About Running Out of Food in the Last Year: Never true    Ran Out of Food in the Last Year: Never true  Transportation Needs: No Transportation Needs (04/13/2022)   PRAPARE - Administrator, Civil Service (Medical): No    Lack of Transportation (Non-Medical): No  Physical Activity: Insufficiently Active (04/21/2022)   Exercise Vital Sign    Days of Exercise per Week: 3 days    Minutes of Exercise per Session: 10 min  Stress: Stress Concern  Present (04/21/2022)   [employer] of Occupational Health - Occupational Stress Questionnaire    Feeling of Stress  : To some extent  Social Connections: Moderately Isolated (04/13/2022)   Social Connection and Isolation Panel [NHANES]    Frequency of Communication with Friends and Family: More than three times a week    Frequency of Social Gatherings with Friends and Family: Three times a week    Attends Religious Services: Never    Active Member of Clubs or Organizations: No    Attends Banker Meetings: Never    Marital Status: Married  Catering manager Violence: Not At Risk (04/13/2022)   Humiliation, Afraid, Rape, and Kick questionnaire    Fear of Current or Ex-Partner: No    Emotionally Abused: No    Physically Abused: No    Sexually Abused: No      ROS:  General: Negative for anorexia, weight loss, fever, chills, fatigue, weakness. Eyes: Negative for vision changes.  ENT: Negative for hoarseness,  nasal congestion. See hpi CV: Negative for chest pain, angina, palpitations, dyspnea on exertion, peripheral edema.  Respiratory: Negative for dyspnea at rest, dyspnea on exertion, cough, sputum, wheezing.  GI: See history of present illness. GU:  Negative for dysuria, hematuria, urinary incontinence, urinary frequency, nocturnal urination.  ON:GEXBMWUX for joint pain, low back pain.  Derm: Negative for rash or itching.  Neuro: Negative for weakness, abnormal sensation, seizure, frequent headaches, memory loss, confusion.  Psych: Negative for anxiety, depression, suicidal ideation, hallucinations.  Endo: Negative for unusual weight change.  Heme: Negative for bruising or bleeding. Allergy: Negative for rash or hives.   Observations/Objective:  Pleasant, well nourished, well developed female in NAD.   Assessment and Plan:  GERD/Dysphagia: -EGD/ED with Dr. Jena Gauss. ASA 3.  I have discussed the risks, alternatives, benefits with regards to but not limited to the risk of reaction to medication, bleeding, infection, perforation and the patient is agreeable to proceed. Written consent  to be obtained. -reinforced anti-reflux measures  Upper abdominal pain: -CT 08/2022 without explanation (similar symptoms at that time) -in setting of Celebrex, need to rule out PUD -increase esomeprazole to 40mg  before breakfast and evening meal -EGD/ED  Constipation -doing well -continue Linzess  Elevated alkaline phosphatase -await lab results -await endocrinology evaluation for bone source -positive ANA, will consider rheumatology referral pending labs    Follow Up Instructions:    I discussed the assessment and treatment plan with the patient. The patient was provided an opportunity to ask questions and all were answered. The patient agreed with the plan and demonstrated an understanding of the instructions. AVS mailed to patient's home address.   The patient was advised to call back or seek an in-person evaluation if the symptoms worsen or if the condition fails to improve as anticipated.  I provided 15 minutes of virtual face-to-face time during this encounter.   Tana Coast, PA-C

## 2023-03-03 ENCOUNTER — Other Ambulatory Visit: Payer: Self-pay | Admitting: Family Medicine

## 2023-03-03 DIAGNOSIS — E7800 Pure hypercholesterolemia, unspecified: Secondary | ICD-10-CM

## 2023-03-03 LAB — ANA TITER AND PATTERN: Homogeneous Pattern: 1:160 {titer} — ABNORMAL HIGH

## 2023-03-03 LAB — PRIMARY BILIARY CHOLANGITIS PR: Anti-Nuclear Ab by IFA (RDL): POSITIVE — AB

## 2023-03-03 LAB — HEPATIC FUNCTION PANEL
ALT: 10 [IU]/L (ref 0–32)
AST: 8 [IU]/L (ref 0–40)
Albumin: 4.2 g/dL (ref 3.8–4.9)
Alkaline Phosphatase: 185 [IU]/L — ABNORMAL HIGH (ref 44–121)
Bilirubin Total: 0.3 mg/dL (ref 0.0–1.2)
Bilirubin, Direct: 0.12 mg/dL (ref 0.00–0.40)
Total Protein: 7.1 g/dL (ref 6.0–8.5)

## 2023-03-08 ENCOUNTER — Telehealth: Payer: Self-pay | Admitting: Cardiology

## 2023-03-08 MED ORDER — AMLODIPINE BESYLATE 10 MG PO TABS: 10.0000 mg | ORAL_TABLET | Freq: Every day | ORAL | 2 refills | Status: DC

## 2023-03-08 NOTE — Telephone Encounter (Signed)
*  STAT* If patient is at the pharmacy, call can be transferred to refill team.   1. Which medications need to be refilled? (please list name of each medication and dose if known) amLODipine (NORVASC) 10 MG tablet   2. Which pharmacy/location (including street and city if local pharmacy) is medication to be sent to? [facility] [address] [place] - [address]   3. Do they need a 30 day or 90 day supply? 30

## 2023-03-08 NOTE — Telephone Encounter (Signed)
 Refill sent.

## 2023-03-11 ENCOUNTER — Other Ambulatory Visit: Payer: Self-pay | Admitting: Family Medicine

## 2023-03-16 ENCOUNTER — Other Ambulatory Visit: Payer: Self-pay | Admitting: Family Medicine

## 2023-03-16 DIAGNOSIS — A609 Anogenital herpesviral infection, unspecified: Secondary | ICD-10-CM

## 2023-03-18 ENCOUNTER — Encounter (HOSPITAL_COMMUNITY): Payer: Self-pay

## 2023-03-18 ENCOUNTER — Emergency Department (HOSPITAL_COMMUNITY): Payer: No Typology Code available for payment source

## 2023-03-18 ENCOUNTER — Other Ambulatory Visit: Payer: Self-pay

## 2023-03-18 ENCOUNTER — Emergency Department (HOSPITAL_COMMUNITY)
Admission: EM | Admit: 2023-03-18 | Discharge: 2023-03-18 | Disposition: A | Discharge status: Home / Self Care | Payer: No Typology Code available for payment source | Attending: Emergency Medicine | Admitting: Emergency Medicine

## 2023-03-18 DIAGNOSIS — R059 Cough, unspecified: Secondary | ICD-10-CM | POA: Diagnosis not present

## 2023-03-18 DIAGNOSIS — R0602 Shortness of breath: Secondary | ICD-10-CM | POA: Insufficient documentation

## 2023-03-18 DIAGNOSIS — J45909 Unspecified asthma, uncomplicated: Secondary | ICD-10-CM | POA: Insufficient documentation

## 2023-03-18 DIAGNOSIS — Z79899 Other long term (current) drug therapy: Secondary | ICD-10-CM | POA: Diagnosis not present

## 2023-03-18 DIAGNOSIS — R079 Chest pain, unspecified: Secondary | ICD-10-CM | POA: Diagnosis not present

## 2023-03-18 DIAGNOSIS — R0789 Other chest pain: Secondary | ICD-10-CM | POA: Diagnosis not present

## 2023-03-18 DIAGNOSIS — I10 Essential (primary) hypertension: Secondary | ICD-10-CM | POA: Insufficient documentation

## 2023-03-18 DIAGNOSIS — R002 Palpitations: Secondary | ICD-10-CM | POA: Insufficient documentation

## 2023-03-18 LAB — TROPONIN I (HIGH SENSITIVITY)
Troponin I (High Sensitivity): <2 ng/L (ref <18)
Troponin I (High Sensitivity): <2 ng/L (ref <18)

## 2023-03-18 LAB — CBC
HCT: 42.8 % (ref 36.0–46.0)
Hemoglobin: 14.3 g/dL (ref 12.0–15.0)
MCH: 31.8 pg (ref 26.0–34.0)
MCHC: 33.4 g/dL (ref 30.0–36.0)
MCV: 95.1 fL (ref 80.0–100.0)
Platelets: 289 10*3/uL (ref 150–400)
RBC: 4.5 MIL/uL (ref 3.87–5.11)
RDW: 12.6 % (ref 11.5–15.5)
WBC: 4.8 10*3/uL (ref 4.0–10.5)
nRBC: 0 % (ref 0.0–0.2)

## 2023-03-18 LAB — BASIC METABOLIC PANEL
Anion gap: 9 (ref 5–15)
BUN: 8 mg/dL (ref 6–20)
CO2: 28 mmol/L (ref 22–32)
Calcium: 9 mg/dL (ref 8.9–10.3)
Chloride: 100 mmol/L (ref 98–111)
Creatinine, Ser: 0.79 mg/dL (ref 0.44–1.00)
GFR, Estimated: >60 mL/min (ref >60)
Glucose, Bld: 92 mg/dL (ref 70–99)
Potassium: 3.2 mmol/L — ABNORMAL LOW (ref 3.5–5.1)
Sodium: 137 mmol/L (ref 135–145)

## 2023-03-18 LAB — RESP PANEL BY RT-PCR (RSV, FLU A&B, COVID)  RVPGX2
Influenza A by PCR: NEGATIVE
Influenza B by PCR: NEGATIVE
Resp Syncytial Virus by PCR: NEGATIVE
SARS Coronavirus 2 by RT PCR: NEGATIVE

## 2023-03-18 MED ORDER — POTASSIUM CHLORIDE CRYS ER 20 MEQ PO TBCR
40.0000 meq | EXTENDED_RELEASE_TABLET | Freq: Once | ORAL | Status: AC
  Administered 2023-03-18: 40 meq via ORAL
  Filled 2023-03-18: qty 2

## 2023-03-18 NOTE — ED Provider Notes (Signed)
 [place] EMERGENCY DEPARTMENT AT [facility] Provider Note   CSN: [id number] Arrival date & time: 03/18/23  1478     History {Add pertinent medical, surgical, social history, OB history to HPI:1} Chief Complaint  Patient presents with  . Chest Pain    [name] is a 63 y.o. female with history significant for hypertension, asthma, aortic insufficiency with diastolic dysfunction, hyperlipidemia and history of palpitations including occasional PVCs and rare nonsustained atrial tachycardia presenting with a 1 week history of viral URI type symptoms including congestion and nonproductive cough, generalized bodyaches and increasing shortness of breath.  She took a home COVID test and it was negative.  She is here today after having an approximate 10-minute episode last night of what she feels was tachycardia, stating she felt her heart beating very fast and heard it in her ears, she became panicky, but then it resolved within approximately 10 minutes.  She continues to have midsternal chest pressure along with some shortness of breath.  She feels like she has congestion in her chest but has been unable to cough it up.  She has not had any OTC cough or cold medications including pseudoephedrine.  No documented fevers, denies wheezing.  No back pain, no nausea or vomiting or abdominal pain.  Denies lower extremity pain or swelling.  The history is provided by the patient.       Home Medications Prior to Admission medications   Medication Sig Start Date End Date Taking? Authorizing Provider  acyclovir (ZOVIRAX) 400 MG tablet Take 1 tablet by mouth twice daily 03/17/23   Lincoln Brigham, MD  amitriptyline (ELAVIL) 150 MG tablet Take 1 tablet by mouth once daily 02/24/23   Lincoln Brigham, MD  amLODipine (NORVASC) 10 MG tablet Take 1 tablet (10 mg total) by mouth daily. 03/08/23   Antoine Poche, MD  atorvastatin (LIPITOR) 40 MG tablet TAKE 1 TABLET BY MOUTH ONCE DAILY. NEED APPOINTMENT  WITH PCP TO HAVE CHOLESTEROL CHECKED 03/04/23   Lincoln Brigham, MD  budesonide-formoterol [facility]) 160-4.5 MCG/ACT inhaler Inhale 2 puffs into the lungs 2 (two) times daily. 10/04/22   Lincoln Brigham, MD  celecoxib (CELEBREX) 200 MG capsule TAKE 1 CAPSULE BY MOUTH ONCE DAILY AS NEEDED 01/24/23   Lincoln Brigham, MD  DULoxetine (CYMBALTA) 60 MG capsule Take 1 capsule by mouth twice daily 01/24/23   Lincoln Brigham, MD  esomeprazole (NEXIUM) 40 MG capsule Take 1 capsule (40 mg total) by mouth daily after breakfast. 08/26/22   Tiffany Kocher, PA-C  furosemide (LASIX) 20 MG tablet Take 1 tablet (20 mg total) by mouth daily as needed for edema. 03/25/22   Creig Hines, NP  hydrocortisone (ANUSOL-HC) 2.5 % rectal cream Place 1 Application rectally 2 (two) times daily. Up to 14 days at a time. 08/24/22   Tiffany Kocher, PA-C  linaclotide (LINZESS) 290 MCG CAPS capsule TAKE 1 CAPSULE BY MOUTH ONCE DAILY BEFORE BREAKFAST 04/05/22   Aida Raider, NP  pregabalin (LYRICA) 100 MG capsule Take 1 capsule by mouth twice daily 03/14/23   Lincoln Brigham, MD      Allergies    Promethazine hcl    Review of Systems   Review of Systems  Constitutional:  Negative for chills and fever.  HENT:  Positive for congestion. Negative for sore throat.   Eyes: Negative.   Respiratory:  Positive for cough, chest tightness and shortness of breath.   Cardiovascular:  Positive for chest pain and palpitations. Negative for leg  swelling.  Gastrointestinal:  Negative for abdominal pain, nausea and vomiting.  Genitourinary: Negative.   Musculoskeletal:  Positive for myalgias. Negative for arthralgias, joint swelling and neck pain.  Skin: Negative.  Negative for rash and wound.  Neurological:  Negative for dizziness, weakness, light-headedness, numbness and headaches.  Psychiatric/Behavioral: Negative.      Physical Exam Updated Vital Signs BP (!) 131/59   Pulse 84   Temp 98.5 F (36.9 C) (Oral)   Resp 19   Ht 5\' 4"  (1.626  m)   Wt 72.6 kg   SpO2 95%   BMI 27.46 kg/m  Physical Exam Vitals and nursing note reviewed.  Constitutional:      Appearance: She is well-developed.  HENT:     Head: Normocephalic and atraumatic.  Eyes:     Conjunctiva/sclera: Conjunctivae normal.  Cardiovascular:     Rate and Rhythm: Normal rate and regular rhythm.     Heart sounds: Normal heart sounds.  Pulmonary:     Effort: Pulmonary effort is normal.     Breath sounds: Normal breath sounds. No decreased breath sounds or wheezing.  Abdominal:     General: Bowel sounds are normal.     Palpations: Abdomen is soft.     Tenderness: There is no abdominal tenderness.  Musculoskeletal:        General: Normal range of motion.     Cervical back: Normal range of motion.  Skin:    General: Skin is warm and dry.  Neurological:     Mental Status: She is alert.    ED Results / Procedures / Treatments   Labs (all labs ordered are listed, but only abnormal results are displayed) Labs Reviewed  BASIC METABOLIC PANEL - Abnormal; Notable for the following components:      Result Value   Potassium 3.2 (*)    All other components within normal limits  RESP PANEL BY RT-PCR (RSV, FLU A&B, COVID)  RVPGX2  CBC  TROPONIN I (HIGH SENSITIVITY)  TROPONIN I (HIGH SENSITIVITY)    EKG EKG Interpretation Date/Time:  Friday March 18 2023 11:11:58 EST Ventricular Rate:  78 PR Interval:  152 QRS Duration:  78 QT Interval:  372 QTC Calculation: 424 R Axis:   4  Text Interpretation: Sinus rhythm Borderline repolarization abnormality no significant change since 2020 Confirmed by Pricilla Loveless [id number]) on 03/18/2023 12:31:21 PM  Radiology DG Chest 2 View Result Date: 03/18/2023 CLINICAL DATA:  Chest pain. EXAM: CHEST - 2 VIEW COMPARISON:  Chest radiograph dated 02/19/2020. FINDINGS: The heart size is within normal limits. Similar postoperative changes of the left hilum related to history of left-sided lobectomy. No edema, focal  consolidation, pleural effusion, or pneumothorax. No acute osseous abnormality. IMPRESSION: No acute cardiopulmonary findings. Electronically Signed   By: Hart Robinsons M.D.   On: 03/18/2023 12:05    Procedures Procedures  {Document cardiac monitor, telemetry assessment procedure when appropriate:1}  Medications Ordered in ED Medications  potassium chloride SA (KLOR-CON M) CR tablet 40 mEq (40 mEq Oral Given 03/18/23 1117)    ED Course/ Medical Decision Making/ A&P   {   Click here for ABCD2, HEART and other calculatorsREFRESH Note before signing :1}                              Medical Decision Making Amount and/or Complexity of Data Reviewed Labs: ordered. Radiology: ordered.  Risk Prescription drug management.     {Document critical care time  when appropriate:1} {Document review of labs and clinical decision tools ie heart score, Chads2Vasc2 etc:1}  {Document your independent review of radiology images, and any outside records:1} {Document your discussion with family members, caretakers, and with consultants:1} {Document social determinants of health affecting pt's care:1} {Document your decision making why or why not admission, treatments were needed:1} Final Clinical Impression(s) / ED Diagnoses Final diagnoses:  Atypical chest pain  Palpitation    Rx / DC Orders ED Discharge Orders     None

## 2023-03-18 NOTE — ED Notes (Signed)
 Pt did not want to wait for her discharge paper and left per secretary.

## 2023-03-18 NOTE — ED Triage Notes (Signed)
 Pt c/o of central chest pain. Pt sick for a week. Upper respiratory symptoms. Pt states chest pain started last night after laying down "felt heart pounding and could hear pounding in ears". No noticed distress during triage.

## 2023-03-18 NOTE — Discharge Instructions (Signed)
 Your lab test, EKG and chest x-ray today are reassuring, given your symptoms are resolved we feel it is safe for you to go home, however you may benefit from follow-up appointment with Dr. Verna Czech office.  Please call for an appointment.

## 2023-03-21 ENCOUNTER — Other Ambulatory Visit: Payer: Self-pay | Admitting: Gastroenterology

## 2023-03-21 NOTE — Telephone Encounter (Signed)
Phoned and advised the pt of her Rx being sent to her pharmacy 

## 2023-03-22 ENCOUNTER — Other Ambulatory Visit: Payer: Self-pay

## 2023-03-22 DIAGNOSIS — R748 Abnormal levels of other serum enzymes: Secondary | ICD-10-CM

## 2023-03-23 ENCOUNTER — Other Ambulatory Visit: Payer: Self-pay | Admitting: *Deleted

## 2023-03-23 DIAGNOSIS — R768 Other specified abnormal immunological findings in serum: Secondary | ICD-10-CM

## 2023-03-25 ENCOUNTER — Other Ambulatory Visit: Payer: Self-pay | Admitting: Family Medicine

## 2023-03-31 ENCOUNTER — Other Ambulatory Visit: Payer: Self-pay

## 2023-03-31 ENCOUNTER — Ambulatory Visit: Payer: No Typology Code available for payment source | Admitting: Nurse Practitioner

## 2023-03-31 ENCOUNTER — Ambulatory Visit (HOSPITAL_COMMUNITY): Admitting: Anesthesiology

## 2023-03-31 ENCOUNTER — Encounter (HOSPITAL_COMMUNITY)
Admission: RE | Disposition: A | Discharge status: Home / Self Care | Payer: Self-pay | Source: Home / Self Care | Attending: Internal Medicine

## 2023-03-31 ENCOUNTER — Ambulatory Visit (HOSPITAL_COMMUNITY)
Admission: RE | Admit: 2023-03-31 | Discharge: 2023-03-31 | Disposition: A | Discharge status: Home / Self Care | Payer: No Typology Code available for payment source | Attending: Internal Medicine | Admitting: Internal Medicine

## 2023-03-31 ENCOUNTER — Encounter (HOSPITAL_COMMUNITY): Payer: Self-pay | Admitting: Internal Medicine

## 2023-03-31 DIAGNOSIS — R1310 Dysphagia, unspecified: Secondary | ICD-10-CM | POA: Insufficient documentation

## 2023-03-31 DIAGNOSIS — Z8249 Family history of ischemic heart disease and other diseases of the circulatory system: Secondary | ICD-10-CM | POA: Insufficient documentation

## 2023-03-31 DIAGNOSIS — I10 Essential (primary) hypertension: Secondary | ICD-10-CM | POA: Diagnosis not present

## 2023-03-31 DIAGNOSIS — F32A Depression, unspecified: Secondary | ICD-10-CM | POA: Insufficient documentation

## 2023-03-31 DIAGNOSIS — F419 Anxiety disorder, unspecified: Secondary | ICD-10-CM | POA: Insufficient documentation

## 2023-03-31 DIAGNOSIS — G4730 Sleep apnea, unspecified: Secondary | ICD-10-CM | POA: Diagnosis not present

## 2023-03-31 DIAGNOSIS — K219 Gastro-esophageal reflux disease without esophagitis: Secondary | ICD-10-CM | POA: Diagnosis not present

## 2023-03-31 DIAGNOSIS — Z8719 Personal history of other diseases of the digestive system: Secondary | ICD-10-CM | POA: Diagnosis not present

## 2023-03-31 DIAGNOSIS — J449 Chronic obstructive pulmonary disease, unspecified: Secondary | ICD-10-CM | POA: Insufficient documentation

## 2023-03-31 DIAGNOSIS — Z87891 Personal history of nicotine dependence: Secondary | ICD-10-CM | POA: Diagnosis not present

## 2023-03-31 HISTORY — PX: MALONEY DILATION: SHX5535

## 2023-03-31 HISTORY — PX: ESOPHAGOGASTRODUODENOSCOPY (EGD) WITH PROPOFOL: SHX5813

## 2023-03-31 SURGERY — ESOPHAGOGASTRODUODENOSCOPY (EGD) WITH PROPOFOL
Anesthesia: General

## 2023-03-31 MED ORDER — LACTATED RINGERS IV SOLN: INTRAVENOUS | Status: DC

## 2023-03-31 MED ORDER — GLYCOPYRROLATE PF 0.2 MG/ML IJ SOSY
PREFILLED_SYRINGE | INTRAMUSCULAR | Status: DC | PRN
  Administered 2023-03-31: .1 mg via INTRAVENOUS

## 2023-03-31 MED ORDER — LIDOCAINE HCL (PF) 2 % IJ SOLN
INTRAMUSCULAR | Status: DC | PRN
  Administered 2023-03-31: 60 mg via INTRADERMAL

## 2023-03-31 MED ORDER — PROPOFOL 10 MG/ML IV BOLUS
INTRAVENOUS | Status: DC | PRN
  Administered 2023-03-31: 30 mg via INTRAVENOUS
  Administered 2023-03-31: 80 mg via INTRAVENOUS
  Administered 2023-03-31: 30 mg via INTRAVENOUS

## 2023-03-31 MED ORDER — GLYCOPYRROLATE PF 0.2 MG/ML IJ SOSY
PREFILLED_SYRINGE | INTRAMUSCULAR | Status: AC
  Filled 2023-03-31: qty 1

## 2023-03-31 MED ORDER — LIDOCAINE HCL (PF) 2 % IJ SOLN
INTRAMUSCULAR | Status: AC
  Filled 2023-03-31: qty 5

## 2023-03-31 MED ORDER — PROPOFOL 500 MG/50ML IV EMUL
INTRAVENOUS | Status: DC | PRN
  Administered 2023-03-31: 150 ug/kg/min via INTRAVENOUS

## 2023-03-31 NOTE — Op Note (Signed)
 [facility] Patient Name: [name] Procedure Date: 03/31/2023 10:57 AM MRN: [id number] Date of Birth: 1960/06/29 Attending MD: Gennette Pac , MD, [id number] CSN: [id number] Age: 63 Admit Type: Outpatient Procedure:                Upper GI endoscopy Indications:              Dysphagia Providers:                Gennette Pac, MD, Crystal Page, Dyann Ruddle Referring MD:              Medicines:                Propofol per Anesthesia Complications:            No immediate complications. Estimated Blood Loss:     Estimated blood loss was minimal. Procedure:                Pre-Anesthesia Assessment:                           - Prior to the procedure, a History and Physical                            was performed, and patient medications and                            allergies were reviewed. The patient's tolerance of                            previous anesthesia was also reviewed. The risks                            and benefits of the procedure and the sedation                            options and risks were discussed with the patient.                            All questions were answered, and informed consent                            was obtained. Prior Anticoagulants: The patient has                            taken no anticoagulant or antiplatelet agents. ASA                            Grade Assessment: II - A patient with mild systemic                            disease. After reviewing the risks and benefits,                            the patient was deemed in satisfactory condition to  undergo the procedure.                           After obtaining informed consent, the endoscope was                            passed under direct vision. Throughout the                            procedure, the patient's blood pressure, pulse, and                            oxygen saturations were monitored continuously. The                             [id number]-[id number] ([id number]) scope was introduced through the                            mouth, and advanced to the second part of duodenum.                            The upper GI endoscopy was accomplished without                            difficulty. The patient tolerated the procedure                            well. Scope In: 11:18:34 AM Scope Out: 11:23:09 AM Total Procedure Duration: 0 hours 4 minutes 35 seconds  Findings:      The examined esophagus was normal.      The entire examined stomach was normal.      The duodenal bulb and second portion of the duodenum were normal. The       scope was withdrawn. Dilation was performed with a Maloney dilator with       no resistance at 56 Fr. Dilation was performed with a Maloney dilator       with mild resistance at 58 Fr. The dilation site was examined following       endoscope reinsertion and showed no change. Estimated blood loss was       minimal. Impression:               - Normal esophagus. Dilated.                           - Normal stomach.                           - Normal duodenal bulb and second portion of the                            duodenum.                           - No specimens collected. Moderate Sedation:      Moderate (conscious) sedation was personally administered by an       anesthesia professional. The  following parameters were monitored: oxygen       saturation, heart rate, blood pressure, respiratory rate, EKG, adequacy       of pulmonary ventilation, and response to care. Recommendation:           - Patient has a contact number available for                            emergencies. The signs and symptoms of potential                            delayed complications were discussed with the                            patient. Return to normal activities tomorrow.                            Written discharge instructions were provided to the                            patient.                           -  Advance diet as tolerated.                           - Continue present medications. Please take Nexium                            40 mg daily 30 minutes before breakfast rather than                            30 minutes after breakfast as patient has been doing                           - Return to my office in 3 months. Procedure Code(s):        --- Professional ---                           [phone], Esophagogastroduodenoscopy, flexible,                            transoral; diagnostic, including collection of                            specimen(s) by brushing or washing, when performed                            (separate procedure)                           43450, Dilation of esophagus, by unguided sound or                            bougie, single or multiple passes Diagnosis Code(s):        --- Professional ---  R13.10, Dysphagia, unspecified CPT copyright 2022 [employer]. All rights reserved. The codes documented in this report are preliminary and upon coder review may  be revised to meet current compliance requirements. Gerrit Friends. Tyreshia Ingman, MD Gennette Pac, MD 03/31/2023 11:30:05 AM This report has been signed electronically. Number of Addenda: 0

## 2023-03-31 NOTE — H&P (Signed)
 @LOGO @   Primary Care Physician:  Lincoln Brigham, MD Primary Gastroenterologist:  Dr. Jena Gauss  Pre-Procedure History & Physical: HPI:  [name] is a 63 y.o. female here for   Further evaluation management of esophageal dysphagia.  History of Schatzki's ring dilated previously.  Also epigastric pain.  Takes Celebrex.  Takes Nexium after breakfast.  Past Medical History:  Diagnosis Date   Anxiety    Aortic insufficiency    a. 10/2017 Echo: Mod AI; b. 04/2020 Echo: Mod AI.   Asthma    every now and then.  Wheezing and coughing   Cancer (HCC) 1997   left lung   Chronic abdominal pain    Chronic back pain    Chronic joint pain    Depression    Diastolic dysfunction    a. 10/2017 Echo: EF 60-65%, GrII DD, mod AI; b. 04/2020 Echo: EF 60-65%, no rwma, GrI DD, nl RV fxn, mod AI.   Essential hypertension    GERD (gastroesophageal reflux disease)    HLD (hyperlipidemia)    Palpitations 01/2013   a. 11/2017 Holter: Occas PACs, rare PVCs. Rare nonsustained atrial tachycardia; b. 01/2018 Event monitor: Predominantly sinus rhythm @ 90 (64-135). No significant arrhythmias.   Shortness of breath dyspnea    with exertion    Past Surgical History:  Procedure Laterality Date   ABDOMINAL HYSTERECTOMY     ABDOMINAL SURGERY     ovarian cyst removal   BALLOON DILATION N/A 02/27/2021   Procedure: BALLOON DILATION;  Surgeon: [name], DO;  [facility];  Service: Endoscopy;  Laterality: N/A;   BIOPSY N/A 11/22/2013   Procedure: GASTRIC BIOPSY;  Surgeon: [name], MD;  Location: [facility];  Service: Endoscopy;  Laterality: N/A;   BIOPSY  02/27/2021   Procedure: BIOPSY;  Surgeon: [name], DO;  [facility];  Service: Endoscopy;;   BIOPSY  05/12/2022   Procedure: BIOPSY;  Surgeon: [name], MD;  [facility];  Service: Endoscopy;;   BLADDER SURGERY  suspension x4   x 4   CARPAL TUNNEL RELEASE Right 07/11/2015   Procedure: RIGHT CARPAL TUNNEL  RELEASE;  Surgeon: Kerrin Champagne, MD;  Location: [facility];  Service: Orthopedics;  Laterality: Right;   CARPAL TUNNEL RELEASE Left 05/19/2022   Procedure: CARPAL TUNNEL RELEASE;  Surgeon: Marlyne Beards, MD;  Location: [facility];  Service: Orthopedics;  Laterality: Left;  or MAC with regional 60   CHOLECYSTECTOMY N/A 04/05/2014   Procedure: LAPAROSCOPIC CHOLECYSTECTOMY;  Surgeon: Franky Macho Md, MD;  Location: [facility];  Service: General;  Laterality: N/A;   COCCYGECTOMY N/A 04/11/2015   Procedure: COCCYGECTOMY WITH OSTEOTOMY THROUGH AREA OF DEFORMITY;  Surgeon: Kerrin Champagne, MD;  Location: [facility];  Service: Orthopedics;  Laterality: N/A;   COLONOSCOPY     COLONOSCOPY WITH PROPOFOL N/A 11/22/2013   Dr. Jena Gauss: Grade 3 and 4/internal hemorrhoids?"likely source of hematochezia Normal colonoscopy otherwise.    COLONOSCOPY WITH PROPOFOL N/A 09/15/2017   Dr. Lovena Neighbours: Hemorrhoids, next colonoscopy 10 years   ESOPHAGOGASTRODUODENOSCOPY (EGD) WITH PROPOFOL N/A 11/22/2013   Dr. Jena Gauss: Incomplete Schatzki's ring dilated and disrupted as described above.  Small hiatal hernia. Focally abnormal gastric mucosa of uncertain significance status post biopsy, reactive gastropathy, negative H.pylori   ESOPHAGOGASTRODUODENOSCOPY (EGD) WITH PROPOFOL N/A 08/04/2015   Dr. Jena Gauss: mild Schatzki's ring s/p dilation, small hiatal hernia    ESOPHAGOGASTRODUODENOSCOPY (EGD) WITH PROPOFOL N/A 03/17/2017   Mild Schatki's  ring s/p dilation, small hiatal hernia, otherwise normal   ESOPHAGOGASTRODUODENOSCOPY (EGD) WITH PROPOFOL N/A 09/18/2018   Dr. Jena Gauss: Schatzki ring status post dilation and disruption.  Hiatal hernia.   ESOPHAGOGASTRODUODENOSCOPY (EGD) WITH PROPOFOL N/A 12/20/2019   non-obstructing Schatzki's ring s/p dilation with 56, 58, and 60 F.   ESOPHAGOGASTRODUODENOSCOPY (EGD) WITH PROPOFOL N/A 02/27/2021   Procedure: ESOPHAGOGASTRODUODENOSCOPY (EGD) WITH PROPOFOL;  Surgeon: [name], DO;  [facility];  Service: Endoscopy;  Laterality: N/A;  11:30am   ESOPHAGOGASTRODUODENOSCOPY (EGD) WITH PROPOFOL N/A 05/12/2022   Procedure: ESOPHAGOGASTRODUODENOSCOPY (EGD) WITH PROPOFOL;  Surgeon: [name], MD;  [facility];  Service: Endoscopy;  Laterality: N/A;  8:30 am   EXCISION VAGINAL CYST Left 06/20/2012   Procedure: EXCISION LEFT LABIAL CYST;  Surgeon: [name], MD;  Location: [facility];  Service: Gynecology;  Laterality: Left;   GANGLION CYST EXCISION Left 05/19/2022   Procedure: left volar carpal ganglion cyst excision;  Surgeon: Marlyne Beards, MD;  Location: [facility];  Service: Orthopedics;  Laterality: Left;  or MAC with regional 60   LOBECTOMY Left    LUNG CANCER SURGERY  1997   age 63, resection only   MALONEY DILATION N/A 11/22/2013   Procedure: MALONEY DILATION;  Surgeon: [name], MD;  Location: [facility];  Service: Endoscopy;  Laterality: N/A;  56   MALONEY DILATION N/A 08/04/2015   Procedure: Elease Hashimoto DILATION;  Surgeon: [name], MD;  [facility];  Service: Endoscopy;  Laterality: N/A;   MALONEY DILATION N/A 03/17/2017   Procedure: Elease Hashimoto DILATION;  Surgeon: [name], MD;  [facility];  Service: Endoscopy;  Laterality: N/A;   MALONEY DILATION N/A 09/18/2018   Procedure: Elease Hashimoto DILATION;  Surgeon: [name], MD;  [facility];  Service: Endoscopy;  Laterality: N/A;   MALONEY DILATION N/A 12/20/2019   Procedure: Elease Hashimoto DILATION;  Surgeon: [name], MD;  [facility];  Service: Endoscopy;  Laterality: N/A;   MALONEY DILATION N/A 05/12/2022   Procedure: Elease Hashimoto DILATION;  Surgeon: [name], MD;  [facility];  Service: Endoscopy;  Laterality: N/A;   MASS EXCISION N/A 07/23/2014   Procedure: EXCISIONAL BIOPSY NODULE LEFT PARACOCCYGEAL AREA;  Surgeon: Kerrin Champagne, MD;  Location: [facility];  Service: Orthopedics;   Laterality: N/A;   MASS EXCISION Left 10/10/2015   Procedure: EXCISIONAL BIOPSY OF LEFT DORSORADIAL FOREARM MASS LIPOMA;  Surgeon: Kerrin Champagne, MD;  Location: [facility];  Service: Orthopedics;  Laterality: Left;   TUBAL LIGATION      Prior to Admission medications   Medication Sig Start Date End Date Taking? Authorizing Provider  acyclovir (ZOVIRAX) 400 MG tablet Take 1 tablet by mouth twice daily 03/17/23  Yes Lincoln Brigham, MD  amitriptyline (ELAVIL) 150 MG tablet Take 1 tablet by mouth once daily 02/24/23  Yes Lincoln Brigham, MD  amLODipine (NORVASC) 10 MG tablet Take 1 tablet (10 mg total) by mouth daily. 03/08/23  Yes Branch, Dorothe Pea, MD  atorvastatin (LIPITOR) 40 MG tablet TAKE 1 TABLET BY MOUTH ONCE DAILY. NEED APPOINTMENT WITH PCP TO HAVE CHOLESTEROL CHECKED 03/04/23  Yes Lincoln Brigham, MD  budesonide-formoterol [facility]) 160-4.5 MCG/ACT inhaler Inhale 2 puffs into the lungs 2 (two) times daily. 10/04/22  Yes Lincoln Brigham, MD  celecoxib (CELEBREX) 200 MG capsule TAKE 1 CAPSULE BY MOUTH ONCE DAILY AS NEEDED 03/25/23  Yes Lincoln Brigham, MD  DULoxetine (  CYMBALTA) 60 MG capsule Take 1 capsule by mouth twice daily 01/24/23  Yes Lincoln Brigham, MD  esomeprazole (NEXIUM) 40 MG capsule Take 1 capsule (40 mg total) by mouth daily after breakfast. 08/26/22  Yes Tiffany Kocher, PA-C  furosemide (LASIX) 20 MG tablet Take 1 tablet (20 mg total) by mouth daily as needed for edema. 03/25/22  Yes Creig Hines, NP  LINZESS 290 MCG CAPS capsule TAKE 1 CAPSULE BY MOUTH ONCE DAILY BEFORE BREAKFAST 03/21/23  Yes Aida Raider, NP  pregabalin (LYRICA) 100 MG capsule Take 1 capsule by mouth twice daily 03/14/23  Yes Lincoln Brigham, MD  hydrocortisone (ANUSOL-HC) 2.5 % rectal cream Place 1 Application rectally 2 (two) times daily. Up to 14 days at a time. 08/24/22   Tiffany Kocher, PA-C    Allergies as of 03/02/2023 - Review Complete 03/02/2023  Allergen Reaction Noted   Promethazine hcl Nausea  And Vomiting and Other (See Comments) 07/26/2008    Family History  Problem Relation Age of Onset   Diabetes Other    Heart disease Other        has pace maker   Hypertension Mother    Kidney disease Mother    Hypertension Father    Kidney disease Father    Heart disease Father    Cancer Father        leukemia   Hypertension Sister    Hypertension Sister    Colon cancer Neg Hx     Social History   Socioeconomic History   Marital status: Married    Spouse name: Carlo   Number of children: 3   Years of education: Not on file   Highest education level: Not on file  Occupational History   Occupation: Disability  Tobacco Use   Smoking status: Former    Current packs/day: 0.00    Average packs/day: 1.5 packs/day for 25.0 years (37.5 ttl pk-yrs)    Types: Cigarettes    Start date: 04/07/1970    Quit date: 04/07/1995    Years since quitting: 28.0   Smokeless tobacco: Never  Vaping Use   Vaping status: Never Used  Substance and Sexual Activity   Alcohol use: No   Drug use: No   Sexual activity: Not Currently    Birth control/protection: Surgical    Comment: hyst  Other Topics Concern   Not on file  Social History Narrative   Not on file   Social Drivers of Health   Financial Resource Strain: Low Risk  (04/13/2022)   Overall Financial Resource Strain (CARDIA)    Difficulty of Paying Living Expenses: Not hard at all  Food Insecurity: No Food Insecurity (04/13/2022)   Hunger Vital Sign    Worried About Running Out of Food in the Last Year: Never true    Ran Out of Food in the Last Year: Never true  Transportation Needs: No Transportation Needs (04/13/2022)   PRAPARE - Administrator, Civil Service (Medical): No    Lack of Transportation (Non-Medical): No  Physical Activity: Insufficiently Active (04/21/2022)   Exercise Vital Sign    Days of Exercise per Week: 3 days    Minutes of Exercise per Session: 10 min  Stress: Stress Concern Present (04/21/2022)    [employer] of Occupational Health - Occupational Stress Questionnaire    Feeling of Stress : To some extent  Social Connections: Moderately Isolated (04/13/2022)   Social Connection and Isolation Panel [NHANES]    Frequency of Communication with Friends  and Family: More than three times a week    Frequency of Social Gatherings with Friends and Family: Three times a week    Attends Religious Services: Never    Active Member of Clubs or Organizations: No    Attends Banker Meetings: Never    Marital Status: Married  Catering manager Violence: Not At Risk (04/13/2022)   Humiliation, Afraid, Rape, and Kick questionnaire    Fear of Current or Ex-Partner: No    Emotionally Abused: No    Physically Abused: No    Sexually Abused: No    Review of Systems: See HPI, otherwise negative ROS  Physical Exam: BP (!) 135/59   Pulse 67   Temp 97.9 F (36.6 C) (Oral)   Resp 14   Wt 72.6 kg   SpO2 97%   BMI 27.46 kg/m  General:   Alert,  Well-developed, well-nourished, pleasant and cooperative in NAD Neck:  Supple; no masses or thyromegaly. No significant cervical adenopathy. Lungs:  Clear throughout to auscultation.   No wheezes, crackles, or rhonchi. No acute distress. Heart:  Regular rate and rhythm; no murmurs, clicks, rubs,  or gallops. Abdomen: Non-distended, normal bowel sounds.  Soft and nontender without appreciable mass or hepatosplenomegaly.   Impression/Plan:    63 year old lady with recurrent esophageal dysphagia history of Schatzki's ring.  Takes PPI inappropriately after a meal.  Epigastric brain PPI use.  I will offer the patient a EGD with esophageal dilation is feasible/appropriate per plan. The risks, benefits, limitations, alternatives and imponderables have been reviewed with the patient. Potential for esophageal dilation, biopsy, etc. have also been reviewed.  Questions have been answered. All parties agreeable.      Notice: This dictation was prepared  with Dragon dictation along with smaller phrase technology. Any transcriptional errors that result from this process are unintentional and may not be corrected upon review.

## 2023-03-31 NOTE — Transfer of Care (Addendum)
 Immediate Anesthesia Transfer of Care Note  Patient: [name]  Procedure(s) Performed: ESOPHAGOGASTRODUODENOSCOPY (EGD) WITH PROPOFOL DILATION, ESOPHAGUS, USING MALONEY DILATOR  Patient Location: Endoscopy Unit  Anesthesia Type:General  Level of Consciousness: drowsy and patient cooperative  Airway & Oxygen Therapy: Patient Spontanous Breathing and Patient connected to face mask oxygen  Post-op Assessment: Report given to RN and Post -op Vital signs reviewed and stable  Post vital signs: Reviewed and stable  Last Vitals:  Vitals Value Taken Time  BP 142/64 03/31/23   1128  Temp 36.3 03/31/23   1128  Pulse 92 03/31/23   1128  Resp 14 03/31/23   1128  SpO2 97% 03/31/23   1133    Last Pain:  Vitals:   03/31/23 1114  TempSrc:   PainSc: 0-No pain      Patients Stated Pain Goal: 10 (03/31/23 1030)  Complications: No notable events documented.

## 2023-03-31 NOTE — Discharge Instructions (Signed)
 EGD Discharge instructions Please read the instructions outlined below and refer to this sheet in the next few weeks. These discharge instructions provide you with general information on caring for yourself after you leave the hospital. Your doctor may also give you specific instructions. While your treatment has been planned according to the most current medical practices available, unavoidable complications occasionally occur. If you have any problems or questions after discharge, please call your doctor. ACTIVITY You may resume your regular activity but move at a slower pace for the next 24 hours.  Take frequent rest periods for the next 24 hours.  Walking will help expel (get rid of) the air and reduce the bloated feeling in your abdomen.  No driving for 24 hours (because of the anesthesia (medicine) used during the test).  You may shower.  Do not sign any important legal documents or operate any machinery for 24 hours (because of the anesthesia used during the test).  NUTRITION Drink plenty of fluids.  You may resume your normal diet.  Begin with a light meal and progress to your normal diet.  Avoid alcoholic beverages for 24 hours or as instructed by your caregiver.  MEDICATIONS You may resume your normal medications unless your caregiver tells you otherwise.  WHAT YOU CAN EXPECT TODAY You may experience abdominal discomfort such as a feeling of fullness or "gas" pains.  FOLLOW-UP Your doctor will discuss the results of your test with you.  SEEK IMMEDIATE MEDICAL ATTENTION IF ANY OF THE FOLLOWING OCCUR: Excessive nausea (feeling sick to your stomach) and/or vomiting.  Severe abdominal pain and distention (swelling).  Trouble swallowing.  Temperature over 101 F (37.8 C).  Rectal bleeding or vomiting of blood.      your upper GI tract appeared normal today.  I gave your esophagus and nice dilation.  Performed a larger dilation than last time   Please take Nexium every day 30  minutes before breakfast  Office visit with Tana Coast in 3 months

## 2023-03-31 NOTE — Anesthesia Preprocedure Evaluation (Signed)
 Anesthesia Evaluation  Patient identified by MRN, date of birth, ID band Patient awake    Reviewed: Allergy & Precautions, H&P , NPO status , Patient's Chart, lab work & pertinent test results, reviewed documented beta blocker date and time   Airway Mallampati: II  TM Distance: >3 FB Neck ROM: full    Dental no notable dental hx.    Pulmonary neg pulmonary ROS, shortness of breath, asthma , sleep apnea , COPD, former smoker   Pulmonary exam normal breath sounds clear to auscultation       Cardiovascular Exercise Tolerance: Good hypertension, negative cardio ROS  Rhythm:regular Rate:Normal     Neuro/Psych  PSYCHIATRIC DISORDERS Anxiety Depression     Neuromuscular disease negative neurological ROS  negative psych ROS   GI/Hepatic negative GI ROS, Neg liver ROS,GERD  ,,  Endo/Other  negative endocrine ROS    Renal/GU Renal diseasenegative Renal ROS  negative genitourinary   Musculoskeletal   Abdominal   Peds  Hematology negative hematology ROS (+) Blood dyscrasia, anemia   Anesthesia Other Findings   Reproductive/Obstetrics negative OB ROS                             Anesthesia Physical Anesthesia Plan  ASA: 2  Anesthesia Plan: General   Post-op Pain Management:    Induction:   PONV Risk Score and Plan: Propofol infusion  Airway Management Planned:   Additional Equipment:   Intra-op Plan:   Post-operative Plan:   Informed Consent: I have reviewed the patients History and Physical, chart, labs and discussed the procedure including the risks, benefits and alternatives for the proposed anesthesia with the patient or authorized representative who has indicated his/her understanding and acceptance.     Dental Advisory Given  Plan Discussed with: CRNA  Anesthesia Plan Comments:        Anesthesia Quick Evaluation

## 2023-04-01 ENCOUNTER — Encounter (HOSPITAL_COMMUNITY): Payer: Self-pay | Admitting: Internal Medicine

## 2023-04-01 NOTE — Anesthesia Postprocedure Evaluation (Signed)
 Anesthesia Post Note  Patient: [name]  Procedure(s) Performed: ESOPHAGOGASTRODUODENOSCOPY (EGD) WITH PROPOFOL DILATION, ESOPHAGUS, USING MALONEY DILATOR  Patient location during evaluation: Phase II Anesthesia Type: General Level of consciousness: awake Pain management: pain level controlled Vital Signs Assessment: post-procedure vital signs reviewed and stable Respiratory status: spontaneous breathing and respiratory function stable Cardiovascular status: blood pressure returned to baseline and stable Postop Assessment: no headache and no apparent nausea or vomiting Anesthetic complications: no Comments: Late entry   No notable events documented.   Last Vitals:  Vitals:   03/31/23 1128 03/31/23 1133  BP: (!) 142/64 (!) 111/52  Pulse: 92 91  Resp: 14 19  Temp: (!) 36.3 C   SpO2: (!) 14% 97%    Last Pain:  Vitals:   03/31/23 1133  TempSrc:   PainSc: 0-No pain                 Windell Norfolk

## 2023-04-06 ENCOUNTER — Ambulatory Visit: Payer: No Typology Code available for payment source | Admitting: "Endocrinology

## 2023-04-11 DIAGNOSIS — H524 Presbyopia: Secondary | ICD-10-CM | POA: Diagnosis not present

## 2023-04-13 NOTE — Telephone Encounter (Signed)
error 

## 2023-04-14 ENCOUNTER — Other Ambulatory Visit: Payer: Self-pay | Admitting: Family Medicine

## 2023-04-18 ENCOUNTER — Other Ambulatory Visit: Payer: Self-pay | Admitting: Gastroenterology

## 2023-04-18 ENCOUNTER — Telehealth: Payer: Self-pay

## 2023-04-18 NOTE — Telephone Encounter (Signed)
 Pt called stating that her sister was told the pt was to start taking her esomeprazole twice daily after her procedure. Pt states she is out due to needing an updated Rx sent in. The procedure note states once daily. Pt states that the twice daily is helping her. Do you want the pt to continue twice daily or once daily. Please advise.

## 2023-04-18 NOTE — Telephone Encounter (Signed)
 Pt was made aware and verbalized understanding.

## 2023-04-24 ENCOUNTER — Other Ambulatory Visit: Payer: Self-pay | Admitting: Family Medicine

## 2023-04-24 DIAGNOSIS — F339 Major depressive disorder, recurrent, unspecified: Secondary | ICD-10-CM

## 2023-04-25 ENCOUNTER — Ambulatory Visit (INDEPENDENT_AMBULATORY_CARE_PROVIDER_SITE_OTHER): Admitting: "Endocrinology

## 2023-04-25 ENCOUNTER — Encounter: Payer: Self-pay | Admitting: "Endocrinology

## 2023-04-25 VITALS — BP 112/52 | HR 64 | Ht 64.0 in | Wt 156.8 lb

## 2023-04-25 DIAGNOSIS — Z9189 Other specified personal risk factors, not elsewhere classified: Secondary | ICD-10-CM | POA: Insufficient documentation

## 2023-04-25 DIAGNOSIS — R748 Abnormal levels of other serum enzymes: Secondary | ICD-10-CM | POA: Diagnosis not present

## 2023-04-25 NOTE — Progress Notes (Signed)
 Endocrinology Consult Note                                            04/25/2023, 5:01 PM   Subjective:    Patient ID: [name], female    DOB: [birth year], PCP Lincoln Brigham, MD   Past Medical History:  Diagnosis Date   Anxiety    Aortic insufficiency    a. 10/2017 Echo: Mod AI; b. 04/2020 Echo: Mod AI.   Asthma    every now and then.  Wheezing and coughing   Cancer (HCC) 1997   left lung   Chronic abdominal pain    Chronic back pain    Chronic joint pain    Depression    Diastolic dysfunction    a. 10/2017 Echo: EF 60-65%, GrII DD, mod AI; b. 04/2020 Echo: EF 60-65%, no rwma, GrI DD, nl RV fxn, mod AI.   Essential hypertension    GERD (gastroesophageal reflux disease)    HLD (hyperlipidemia)    Palpitations 01/2013   a. 11/2017 Holter: Occas PACs, rare PVCs. Rare nonsustained atrial tachycardia; b. 01/2018 Event monitor: Predominantly sinus rhythm @ 90 (64-135). No significant arrhythmias.   Shortness of breath dyspnea    with exertion   Past Surgical History:  Procedure Laterality Date   ABDOMINAL HYSTERECTOMY     ABDOMINAL SURGERY     ovarian cyst removal   BALLOON DILATION N/A 02/27/2021   Procedure: BALLOON DILATION;  Surgeon: [name], DO;  [facility];  Service: Endoscopy;  Laterality: N/A;   BIOPSY N/A 11/22/2013   Procedure: GASTRIC BIOPSY;  Surgeon: [name], MD;  Location: [facility];  Service: Endoscopy;  Laterality: N/A;   BIOPSY  02/27/2021   Procedure: BIOPSY;  Surgeon: [name], DO;  [facility];  Service: Endoscopy;;   BIOPSY  05/12/2022   Procedure: BIOPSY;  Surgeon: [name], MD;  [facility];  Service: Endoscopy;;   BLADDER SURGERY  suspension x4   x 4   CARPAL TUNNEL RELEASE Right 07/11/2015   Procedure: RIGHT CARPAL TUNNEL RELEASE;  Surgeon: Kerrin Champagne, MD;  Location: [facility];  Service: Orthopedics;  Laterality: Right;   CARPAL TUNNEL RELEASE Left 05/19/2022    Procedure: CARPAL TUNNEL RELEASE;  Surgeon: Marlyne Beards, MD;  Location: [facility];  Service: Orthopedics;  Laterality: Left;  or MAC with regional 60   CHOLECYSTECTOMY N/A 04/05/2014   Procedure: LAPAROSCOPIC CHOLECYSTECTOMY;  Surgeon: Franky Macho Md, MD;  Location: [facility];  Service: General;  Laterality: N/A;   COCCYGECTOMY N/A 04/11/2015   Procedure: COCCYGECTOMY WITH OSTEOTOMY THROUGH AREA OF DEFORMITY;  Surgeon: Kerrin Champagne, MD;  [facility];  Service: Orthopedics;  Laterality: N/A;   COLONOSCOPY     COLONOSCOPY WITH PROPOFOL N/A 11/22/2013   Dr. Jena Gauss: Grade 3 and 4/internal hemorrhoids?"likely source of hematochezia Normal colonoscopy otherwise.    COLONOSCOPY WITH PROPOFOL N/A 09/15/2017   Dr. Lovena Neighbours: Hemorrhoids, next colonoscopy 10 years   ESOPHAGOGASTRODUODENOSCOPY (EGD) WITH PROPOFOL N/A 11/22/2013   Dr. Jena Gauss: Incomplete Schatzki's ring dilated and disrupted as described above.  Small hiatal hernia. Focally abnormal gastric mucosa of uncertain significance status post biopsy, reactive gastropathy, negative H.pylori   ESOPHAGOGASTRODUODENOSCOPY (EGD) WITH PROPOFOL N/A 08/04/2015   Dr. Jena Gauss: mild Schatzki's ring s/p dilation,  small hiatal hernia    ESOPHAGOGASTRODUODENOSCOPY (EGD) WITH PROPOFOL N/A 03/17/2017   Mild Schatki's ring s/p dilation, small hiatal hernia, otherwise normal   ESOPHAGOGASTRODUODENOSCOPY (EGD) WITH PROPOFOL N/A 09/18/2018   Dr. Jena Gauss: Schatzki ring status post dilation and disruption.  Hiatal hernia.   ESOPHAGOGASTRODUODENOSCOPY (EGD) WITH PROPOFOL N/A 12/20/2019   non-obstructing Schatzki's ring s/p dilation with 56, 58, and 60 F.   ESOPHAGOGASTRODUODENOSCOPY (EGD) WITH PROPOFOL N/A 02/27/2021   Procedure: ESOPHAGOGASTRODUODENOSCOPY (EGD) WITH PROPOFOL;  Surgeon: [name], DO;  [facility];  Service: Endoscopy;  Laterality: N/A;  11:30am   ESOPHAGOGASTRODUODENOSCOPY (EGD) WITH PROPOFOL N/A 05/12/2022    Procedure: ESOPHAGOGASTRODUODENOSCOPY (EGD) WITH PROPOFOL;  Surgeon: [name], MD;  [facility];  Service: Endoscopy;  Laterality: N/A;  8:30 am   ESOPHAGOGASTRODUODENOSCOPY (EGD) WITH PROPOFOL N/A 03/31/2023   Procedure: ESOPHAGOGASTRODUODENOSCOPY (EGD) WITH PROPOFOL;  Surgeon: [name], MD;  [facility];  Service: Endoscopy;  Laterality: N/A;  900am, asa 1/2   EXCISION VAGINAL CYST Left 06/20/2012   Procedure: EXCISION LEFT LABIAL CYST;  Surgeon: [name], MD;  Location: [facility];  Service: Gynecology;  Laterality: Left;   GANGLION CYST EXCISION Left 05/19/2022   Procedure: left volar carpal ganglion cyst excision;  Surgeon: Marlyne Beards, MD;  Location: [facility];  Service: Orthopedics;  Laterality: Left;  or MAC with regional 60   LOBECTOMY Left    LUNG CANCER SURGERY  1997   age 30, resection only   MALONEY DILATION N/A 11/22/2013   Procedure: MALONEY DILATION;  Surgeon: [name], MD;  [facility]: [facility];  Service: Endoscopy;  Laterality: N/A;  56   MALONEY DILATION N/A 08/04/2015   Procedure: Elease Hashimoto DILATION;  Surgeon: [name], MD;  [facility];  Service: Endoscopy;  Laterality: N/A;   MALONEY DILATION N/A 03/17/2017   Procedure: Elease Hashimoto DILATION;  Surgeon: [name], MD;  [facility];  Service: Endoscopy;  Laterality: N/A;   MALONEY DILATION N/A 09/18/2018   Procedure: Elease Hashimoto DILATION;  Surgeon: [name], MD;  [facility];  Service: Endoscopy;  Laterality: N/A;   MALONEY DILATION N/A 12/20/2019   Procedure: Elease Hashimoto DILATION;  Surgeon: [name], MD;  [facility];  Service: Endoscopy;  Laterality: N/A;   MALONEY DILATION N/A 05/12/2022   Procedure: Elease Hashimoto DILATION;  Surgeon: [name], MD;  [facility];  Service: Endoscopy;  Laterality: N/A;   MALONEY DILATION N/A 03/31/2023   Procedure: DILATION, ESOPHAGUS, USING MALONEY DILATOR;   Surgeon: [name], MD;  [facility];  Service: Endoscopy;  Laterality: N/A;   MASS EXCISION N/A 07/23/2014   Procedure: EXCISIONAL BIOPSY NODULE LEFT PARACOCCYGEAL AREA;  Surgeon: Kerrin Champagne, MD;  [facility];  Service: Orthopedics;  Laterality: N/A;   MASS EXCISION Left 10/10/2015   Procedure: EXCISIONAL BIOPSY OF LEFT DORSORADIAL FOREARM MASS LIPOMA;  Surgeon: Kerrin Champagne, MD;  Location: [facility];  Service: Orthopedics;  Laterality: Left;   TUBAL LIGATION     Social History   Socioeconomic History   Marital status: Married    Spouse name: Carlo   Number of children: 3   Years of education: Not on file   Highest education level: Not on file  Occupational History   Occupation: Disability  Tobacco Use   Smoking status: Former    Current packs/day: 0.00    Average packs/day:  1.5 packs/day for 25.0 years (37.5 ttl pk-yrs)    Types: Cigarettes    Start date: 04/07/1970    Quit date: 04/07/1995    Years since quitting: 28.0   Smokeless tobacco: Never  Vaping Use   Vaping status: Never Used  Substance and Sexual Activity   Alcohol use: No   Drug use: No   Sexual activity: Not Currently    Birth control/protection: Surgical    Comment: hyst  Other Topics Concern   Not on file  Social History Narrative   Not on file   Social Drivers of Health   Financial Resource Strain: Low Risk  (04/13/2022)   Overall Financial Resource Strain (CARDIA)    Difficulty of Paying Living Expenses: Not hard at all  Food Insecurity: No Food Insecurity (04/13/2022)   Hunger Vital Sign    Worried About Running Out of Food in the Last Year: Never true    Ran Out of Food in the Last Year: Never true  Transportation Needs: No Transportation Needs (04/13/2022)   PRAPARE - Administrator, Civil Service (Medical): No    Lack of Transportation (Non-Medical): No  Physical Activity: Insufficiently Active (04/21/2022)   Exercise Vital Sign    Days of  Exercise per Week: 3 days    Minutes of Exercise per Session: 10 min  Stress: Stress Concern Present (04/21/2022)   [employer] of Occupational Health - Occupational Stress Questionnaire    Feeling of Stress : To some extent  Social Connections: Moderately Isolated (04/13/2022)   Social Connection and Isolation Panel [NHANES]    Frequency of Communication with Friends and Family: More than three times a week    Frequency of Social Gatherings with Friends and Family: Three times a week    Attends Religious Services: Never    Active Member of Clubs or Organizations: No    Attends Engineer, structural: Never    Marital Status: Married   Family History  Problem Relation Age of Onset   Diabetes Other    Heart disease Other        has pace maker   Hypertension Mother    Kidney disease Mother    Hypertension Father    Kidney disease Father    Heart disease Father    Cancer Father        leukemia   Hypertension Sister    Hypertension Sister    Colon cancer Neg Hx    Outpatient Encounter Medications as of 04/25/2023  Medication Sig   DICLOFENAC PO Take by mouth 2 (two) times daily.   amitriptyline (ELAVIL) 150 MG tablet Take 1 tablet by mouth once daily   amLODipine (NORVASC) 10 MG tablet Take 1 tablet (10 mg total) by mouth daily.   atorvastatin (LIPITOR) 40 MG tablet TAKE 1 TABLET BY MOUTH ONCE DAILY. NEED APPOINTMENT WITH PCP TO HAVE CHOLESTEROL CHECKED   budesonide-formoterol (SYMBICORT) 160-4.5 MCG/ACT inhaler Inhale 2 puffs into the lungs 2 (two) times daily.   celecoxib (CELEBREX) 200 MG capsule TAKE 1 CAPSULE BY MOUTH ONCE DAILY AS NEEDED   esomeprazole (NEXIUM) 40 MG capsule Take 1 capsule (40 mg total) by mouth daily after breakfast.   furosemide (LASIX) 20 MG tablet Take 1 tablet (20 mg total) by mouth daily as needed for edema.   hydrocortisone (ANUSOL-HC) 2.5 % rectal cream Place 1 Application rectally 2 (two) times daily. Up to 14 days at a time.   linaclotide  (LINZESS) 290 MCG CAPS capsule TAKE 1  CAPSULE BY MOUTH ONCE DAILY BEFORE BREAKFAST   pregabalin (LYRICA) 100 MG capsule Take 1 capsule (100 mg total) by mouth 2 (two) times daily. Needs appointment with PCP before next refill   [DISCONTINUED] acyclovir (ZOVIRAX) 400 MG tablet Take 1 tablet by mouth twice daily   [DISCONTINUED] DULoxetine (CYMBALTA) 60 MG capsule Take 1 capsule by mouth twice daily   No facility-administered encounter medications on file as of 04/25/2023.   ALLERGIES: Allergies  Allergen Reactions   Promethazine Hcl Nausea And Vomiting and Other (See Comments)    "Makes my head hurt really bad too"    VACCINATION STATUS: Immunization History  Administered Date(s) Administered   Influenza Whole 10/24/2007   Td 09/19/2002    HPI Fable A Lingo is 63 y.o. female who presents today with a medical history as above. she is being seen in consultation for elevated alkaline phosphatase requested by Lincoln Brigham, MD.  Patient has multiple medical problems including lung cancer status postsurgical treatment in the remote past.   She also has medical history of resection of benign bone tumor from her coccygeal region. She is a former smoker with COPD/asthma. For the last 2 years she did have mild to moderate elevation of alkaline phosphatase with some rare interval improvement.  She was worked up by GI pointing towards unlikely hepatic or origin.  She did have normal GGT. She does not have any prior history of Paget's disease. She is documented to have steatohepatitis.  She is on polypharmacy including atorvastatin, duloxetine, amitriptyline,  amlodipine, celecoxib diclofenac,  Linzess, esomeprazole,  furosemide, budesonide. Patient does not have acute complaints today.  Review of Systems  Constitutional: +mildly fluctuating body weight ,  no fatigue, no subjective hyperthermia, no subjective hypothermia Eyes: no blurry vision, no xerophthalmia ENT: no sore throat, no nodules  palpated in throat, no dysphagia/odynophagia, no hoarseness Cardiovascular: no Chest Pain, no Shortness of Breath, no palpitations, no leg swelling Respiratory: + cough, + wheezes, no shortness of breath Gastrointestinal: no Nausea/Vomiting/Diarhhea Musculoskeletal: no muscle/joint aches Skin: no rashes Neurological: no tremors, no numbness, no tingling, no dizziness Psychiatric: no depression, no anxiety  Objective:       04/25/2023    8:16 AM 03/31/2023   11:33 AM 03/31/2023   11:28 AM  Vitals with BMI  Height 5\' 4"     Weight 156 lbs 13 oz    BMI 26.9    Systolic 112 111 696  Diastolic 52 52 64  Pulse 64 91 92    BP (!) 112/52   Pulse 64   Ht 5\' 4"  (1.626 m)   Wt 156 lb 12.8 oz (71.1 kg)   BMI 26.91 kg/m   Wt Readings from Last 3 Encounters:  04/25/23 156 lb 12.8 oz (71.1 kg)  03/31/23 160 lb (72.6 kg)  03/18/23 160 lb (72.6 kg)    Physical Exam  Constitutional:  Body mass index is 26.91 kg/m.,  not in acute distress, normal state of mind Eyes: PERRLA, EOMI, no exophthalmos ENT: moist mucous membranes, no gross thyromegaly, no gross cervical lymphadenopathy Cardiovascular: normal precordial activity, Regular Rate and Rhythm, no Murmur/Rubs/Gallops Respiratory:  adequate breathing efforts, + bilateral wheezes on bilateral lung fields.  Gastrointestinal: abdomen soft, Non -tender, No distension, Bowel Sounds present, no gross organomegaly Musculoskeletal: no gross deformities, strength intact in all four extremities, no peripheral edema Skin: moist, warm, no rashes Neurological: no tremor with outstretched hands, Deep tendon reflexes normal in bilateral lower extremities.  CMP ( most recent) CMP  Component Value Date/Time   NA 137 03/18/2023 1005   NA 142 09/13/2022 0818   K 3.2 (L) 03/18/2023 1005   CL 100 03/18/2023 1005   CO2 28 03/18/2023 1005   GLUCOSE 92 03/18/2023 1005   BUN 8 03/18/2023 1005   BUN 7 09/13/2022 0818   CREATININE 0.79 03/18/2023 1005    CREATININE 0.85 03/16/2021 0747   CALCIUM 9.0 03/18/2023 1005   PROT 7.1 02/21/2023 1204   ALBUMIN 4.2 02/21/2023 1204   AST 8 02/21/2023 1204   ALT 10 02/21/2023 1204   ALKPHOS 185 (H) 02/21/2023 1204   BILITOT 0.3 02/21/2023 1204   EGFR 92 09/13/2022 0818   GFRNONAA >60 03/18/2023 1005   GFRNONAA 97 07/09/2020 0947     Diabetic Labs (most recent): Lab Results  Component Value Date   HGBA1C 5.9 05/18/2021   HGBA1C 5.8 (A) 11/20/2020   HGBA1C 5.5 12/10/2017     Lipid Panel ( most recent) Lipid Panel     Component Value Date/Time   CHOL 226 (H) 02/15/2013 0926   TRIG 189 (H) 02/15/2013 0926   TRIG 86 06/14/2003 0000   HDL 34 (L) 02/15/2013 0926   CHOLHDL 6.6 02/15/2013 0926   VLDL 38 02/15/2013 0926   LDLCALC 154 (H) 02/15/2013 0926      Lab Results  Component Value Date   TSH 0.531 09/13/2022   TSH 0.83 07/09/2020   TSH 0.744 04/14/2016   TSH 0.572 02/15/2013   TSH 0.465 10/09/2008   FREET4 1.13 09/13/2022           Assessment & Plan:   1. Elevated alkaline phosphatase level (Primary)  - Kalla A Conchas  is being seen at a kind request of Lincoln Brigham, MD. - I have reviewed her available  records and clinically evaluated the patient. - Based on these reviews, she has persistently elevated alkaline phosphatase,  however,  there is not sufficient information to proceed with definitive treatment plan. -She did have workup to rule out GI causes. Etiology likely multifactorial, suspect medication induced versus rare bone pathology such as Paget's disease.  She is advised to discontinue diclofenac for observation.  Out of her medications, this 1 is a major suspect. She will have repeat labs before her next visit. This will include repeat CMP, vitamin D. Her risk for osteomalacia/vitamin D deficiency has to be entertained at these conditions are more common and usually associated with elevated alkaline phosphatase.    -She will also be considered for  whole-body bone scan given her history of malignancy as well as in the interest of ruling out Paget's disease. She will also benefit from bone density as she is high fracture risk. -Alkaline phosphatase elevation is also infrequently observed with fatty liver disease which the patient has.  She is encouraged to stay on her atorvastatin currently 40 mg p.o. nightly.  If stopping the diclofenac does not reveal any improvement, she will be considered for a lower dose of atorvastatin or switch to a different statin. - I did not initiate any new prescriptions today. - she is advised to maintain close follow up with Lincoln Brigham, MD for primary care needs.   -Thank you for involving me in the care of this pleasant patient.  Time spent with the patient: 46  minutes spent in  counseling her about elevated alkaline phosphatase and the rest in obtaining information about her symptoms, reviewing her previous labs/studies (including abstractions from other facilities),  evaluations, and treatments,  and developing a  plan to confirm diagnosis and long term treatment based on the latest standards of care/guidelines; and documenting her care.  [name] participated in the discussions, expressed understanding, and voiced agreement with the above plans.  All questions were answered to her satisfaction. she is encouraged to contact clinic should she have any questions or concerns prior to her return visit.  Follow up plan: Return in about 9 weeks (around 06/27/2023), or Bone scan, DXA, for F/U with Pre-visit Labs.   Marquis Lunch, MD [facility] [address] [place], [place] [address] Phone: [phone]  Fax: [phone]     04/25/2023, 5:01 PM  This note was partially dictated with voice recognition software. Similar sounding words can be transcribed inadequately or may not  be corrected upon review.

## 2023-04-26 ENCOUNTER — Telehealth: Payer: Self-pay

## 2023-04-26 NOTE — Telephone Encounter (Signed)
 Patient calls nurse line regarding visit with endocrinologist yesterday.   She reports that she was told that she should talk to PCP about stopping Cymbalta.   Advised patient that we should schedule her an appointment to further discuss this with Dr. Sherrilee Gilles.   Patient states that she does not have transportation to come in for an appointment.   We went ahead and scheduled her for a mychart visit tomorrow morning with PCP.   Advised patient that I would call her back if there was an issue with virtual visit.   Veronda Prude, RN

## 2023-04-27 ENCOUNTER — Telehealth: Admitting: Family Medicine

## 2023-04-27 ENCOUNTER — Other Ambulatory Visit: Payer: Self-pay | Admitting: Gastroenterology

## 2023-04-27 DIAGNOSIS — I10 Essential (primary) hypertension: Secondary | ICD-10-CM

## 2023-04-27 DIAGNOSIS — R4586 Emotional lability: Secondary | ICD-10-CM

## 2023-04-27 DIAGNOSIS — K219 Gastro-esophageal reflux disease without esophagitis: Secondary | ICD-10-CM

## 2023-04-27 NOTE — Progress Notes (Unsigned)
 [facility] Telemedicine Visit  Patient consented to have virtual visit and was identified by name and date of birth. Method of visit: Telephone  Encounter participants: Patient: [name] - located at home Provider: Lincoln Brigham - located at [facility]   Chief Complaint: Duloxetine change  HPI:  DL is a 18XW F w/ hx of elevated alk phos, mood symptoms, HTN that p/f management of duloxetine.  - Was told by endocrinologist to stop duloxetine prior to her bone scan to workup elevated alk phos - Pt wanted to see if she needs to taper it to avoid withdrawal symptoms - Feels like HTN is well controlled.  - Denies hypotension, dizzieness, changes with standing.    Meds Amitryptriline for sleep Celecoxib unsure what it is for, prescribed by me Amlodipine 10 Atorvastatin 40 Duloxetine  Symbicort prn Lasix prn Lyrica prn  ROS: per HPI  Exam:  There were no vitals taken for this visit.  Respiratory: Normal WOB and speaking comfortably on RA.   Assessment & Plan Mood change Plan to taper duloxetine slowly.  - Take duloxetine 60 daily for 1 week, then 30 daily for 1 week, then 30 every other day for 1 week - Counseled to reach out if she feels withdrawal symptoms such as worsening mood symptoms Essential hypertension, benign     Time spent during visit with patient: 20 minutes

## 2023-04-28 ENCOUNTER — Other Ambulatory Visit: Payer: Self-pay | Admitting: Cardiology

## 2023-04-29 ENCOUNTER — Ambulatory Visit: Payer: No Typology Code available for payment source | Attending: Nurse Practitioner | Admitting: Nurse Practitioner

## 2023-04-29 ENCOUNTER — Encounter: Payer: Self-pay | Admitting: Nurse Practitioner

## 2023-04-29 VITALS — BP 130/72 | HR 68 | Ht 64.0 in | Wt 157.0 lb

## 2023-04-29 DIAGNOSIS — I10 Essential (primary) hypertension: Secondary | ICD-10-CM | POA: Diagnosis not present

## 2023-04-29 DIAGNOSIS — Z87898 Personal history of other specified conditions: Secondary | ICD-10-CM | POA: Diagnosis not present

## 2023-04-29 DIAGNOSIS — R002 Palpitations: Secondary | ICD-10-CM | POA: Diagnosis not present

## 2023-04-29 DIAGNOSIS — E876 Hypokalemia: Secondary | ICD-10-CM | POA: Diagnosis not present

## 2023-04-29 DIAGNOSIS — N179 Acute kidney failure, unspecified: Secondary | ICD-10-CM

## 2023-04-29 DIAGNOSIS — E7800 Pure hypercholesterolemia, unspecified: Secondary | ICD-10-CM

## 2023-04-29 DIAGNOSIS — E785 Hyperlipidemia, unspecified: Secondary | ICD-10-CM

## 2023-04-29 DIAGNOSIS — E782 Mixed hyperlipidemia: Secondary | ICD-10-CM

## 2023-04-29 DIAGNOSIS — I351 Nonrheumatic aortic (valve) insufficiency: Secondary | ICD-10-CM

## 2023-04-29 MED ORDER — DULOXETINE HCL 30 MG PO CPEP: 30.0000 mg | ORAL_CAPSULE | Freq: Every day | ORAL | 0 refills | Status: DC

## 2023-04-29 MED ORDER — ATORVASTATIN CALCIUM 40 MG PO TABS: 40.0000 mg | ORAL_TABLET | Freq: Every day | ORAL | 1 refills | Status: DC

## 2023-04-29 MED ORDER — AMLODIPINE BESYLATE 10 MG PO TABS: 10.0000 mg | ORAL_TABLET | Freq: Every day | ORAL | 1 refills | Status: AC

## 2023-04-29 MED ORDER — FUROSEMIDE 20 MG PO TABS: 20.0000 mg | ORAL_TABLET | Freq: Every day | ORAL | 1 refills | Status: DC | PRN

## 2023-04-29 MED ORDER — AMLODIPINE BESYLATE 10 MG PO TABS: 10.0000 mg | ORAL_TABLET | Freq: Every day | ORAL | 3 refills | Status: DC

## 2023-04-29 MED ORDER — CELECOXIB 200 MG PO CAPS: 200.0000 mg | ORAL_CAPSULE | Freq: Every day | ORAL | 3 refills | Status: DC | PRN

## 2023-04-29 NOTE — Progress Notes (Addendum)
 Cardiology Office Note:  .   Date:  04/29/2023 ID:  [name], DOB 1960/11/02, MRN [id number] PCP: Shawna Huh, MD  [facility] Providers Cardiologist:  Shawna Lander, MD    History of Present Illness: .   [name] is a 63 y.o. female with a PMH of history of chest pain, palpitations, hypertension, history of syncope, COPD, chronic DOE, valvular insufficiency, who presents today for overdue follow-up.  Last seen by Shawna Bene, NP on March 25, 2022.  Patient noted significant life stress at that time.  She noted palpitations occurring every other month, causing anxiety.  Palpitations occurred randomly, brief in duration, resolving spontaneously.  Patient noted intermittent chest pressure with exertion and at rest, about once per month, possibly associated with shortness of breath, lasting briefly, alleviating factors including resting/taking deep breaths.  Self discontinued amlodipine.  Was started on diltiazem 120 mg daily.  Lasix changed to taking this daily as needed.  Echo was arranged.  TTE arranged revealed normal LVEF, small pericardial effusion, trivial MR, moderate AR with mean gradient measuring 6.0 mmHg.  Was recommended to follow-up in the office to evaluate her symptoms in 3 months.  She has not been seen in the office since for follow-up.  ED visit on 03/18/2023 for atypical chest pain.  She presented with 1 week history of viral URI symptoms, tested negative for COVID at home.  Noted palpitations with sensation of fast heart rate and symptoms resolved in about 10 minutes.  Noted midsternal chest pressure along with some shortness of breath.  She was ruled out for ACS, workup was overall unremarkable.  Did have some low potassium, was given potassium supplement.  Today she presents for overdue follow-up.  She states she is doing well.  Sadly, her mother-in-law passed away in 03-03-23.  She says March is a hard month for her as she remembers several deaths of family  members.  Denies any acute cardiac complaints or concerns.  She has noticed recent asthma flareup due to the pollen. Denies any chest pain, shortness of breath, palpitations, syncope, presyncope, dizziness, orthopnea, PND, swelling or significant weight changes, acute bleeding, or claudication.  ROS: Negative. See HPI.  SH: She has 3 adult daughters.  1 daughter works at [employer] with [employer].  2 daughters work for [employer].  Loves going to [facility].  Loves lighthouses. She enjoys doing crafts in her free time. Planning to garden this summer and loves listening to [place] music.  Studies Reviewed: [name]    EKG: EKG is not ordered today.  Echo 06/2022:  1. Left ventricular ejection fraction, by estimation, is 60 to 65%. The  left ventricle has normal function. The left ventricle has no regional  wall motion abnormalities. Left ventricular diastolic parameters are  indeterminate.   2. Right ventricular systolic function is normal. The right ventricular  size is normal. Tricuspid regurgitation signal is inadequate for assessing  PA pressure.   3. A small pericardial effusion is present. The pericardial effusion is  circumferential.   4. The mitral valve is grossly normal. Trivial mitral valve  regurgitation.   5. The aortic valve is tricuspid. Aortic valve regurgitation is moderate.  Aortic regurgitation PHT measures 302 msec. Aortic valve mean gradient measures 6.0 mmHg.   6. The inferior vena cava is normal in size with greater than 50%  respiratory variability, suggesting right atrial pressure of 3 mmHg.   Comparison(s): Prior images reviewed side by side. LVEF remains normal range at 60-65%.  Aortic regurgitation moderate and stable. Pericardial effusion present previously.   Cardiac monitor 03/2018:  30 day event monitor. Data only available from 64% of planned monitored time Min HR 64, Max HR 135, Avg HR 90 No symptoms reported Tracings show sinus rhythm, no significant  arrhythmias  Physical Exam:   VS:  BP 130/72   Pulse 68   Ht 5\' 4"  (1.626 m)   Wt 157 lb (71.2 kg)   SpO2 96%   BMI 26.95 kg/m    Wt Readings from Last 3 Encounters:  04/29/23 157 lb (71.2 kg)  04/25/23 156 lb 12.8 oz (71.1 kg)  03/31/23 160 lb (72.6 kg)    GEN: Well nourished, well developed in no acute distress NECK: No JVD; No carotid bruits CARDIAC: S1/S2, RRR, no murmurs, rubs, gallops RESPIRATORY:  Clear to auscultation without rales, wheezing or rhonchi  ABDOMEN: Soft, non-tender, non-distended EXTREMITIES:  No edema; No deformity   ASSESSMENT AND PLAN: .    Palpitations Denies any palpitations.  Continue current medication regimen. Heart healthy diet and regular cardiovascular exercise encouraged.   HTN SBP mildly elevated. BP normally well controlled. Discussed to monitor BP at home at least 2 hours after medications and sitting for 5-10 minutes.  She will contact us  if her SBP is consistently greater than 130.  No medication changes at this time.  Heart healthy diet and regular cardiovascular exercise encouraged.   Hx of syncope Denies any syncopal episodes. Continue to follow with PCP.  4. Hypokalemia Most recent potassium level in February was 3.2.  Will recheck BMET.  Continue current medication regimen. Continue to follow with PCP.  5. HLD No recent lipid panel on file.  Will obtain FLP in addition to labwork as mentioned above.  Continue atorvastatin. Continue to follow with PCP.  Dispo: Will provide refills per her request.  Follow-up with Dr. Armida Trevino or APP in 6 months or sooner if any changes.  Signed, Shawna Pointer, NP

## 2023-04-29 NOTE — Patient Instructions (Addendum)
 Medication Instructions:  Your physician recommends that you continue on your current medications as directed. Please refer to the Current Medication list given to you today.  Labwork: In 2-3 weeks at [facility]   Testing/Procedures: None   Follow-Up: Your physician recommends that you schedule a follow-up appointment in: 6 months   Any Other Special Instructions Will Be Listed Below (If Applicable).  If you need a refill on your cardiac medications before your next appointment, please call your pharmacy.

## 2023-04-30 ENCOUNTER — Other Ambulatory Visit: Payer: Self-pay | Admitting: Family Medicine

## 2023-05-03 MED ORDER — PREGABALIN 100 MG PO CAPS: 100.0000 mg | ORAL_CAPSULE | Freq: Two times a day (BID) | ORAL | 0 refills | Status: DC

## 2023-05-03 NOTE — Addendum Note (Signed)
 Addended by: Balen Woolum C on: 05/03/2023 04:27 PM   Modules accepted: Orders

## 2023-05-03 NOTE — Telephone Encounter (Signed)
 Received call from patient. She reports that there is an issue with the pharmacy filling prescription.   Called pharmacy. They have tried several times to run DEA number for resident, however, each time it is saying that DEA number is not authorized to prescribe this medication.   Canceled rx and pended new prescription to precepting attending to resend.   Elsie Halo, RN

## 2023-05-09 ENCOUNTER — Telehealth: Payer: Self-pay

## 2023-05-09 DIAGNOSIS — F329 Major depressive disorder, single episode, unspecified: Secondary | ICD-10-CM

## 2023-05-09 MED ORDER — DULOXETINE HCL 60 MG PO CPEP: 60.0000 mg | ORAL_CAPSULE | Freq: Two times a day (BID) | ORAL | 3 refills | Status: DC

## 2023-05-09 NOTE — Telephone Encounter (Signed)
 Patient calls nurse line in regards to Cymbalta .   She reports weaned herself down to 30mg , she reports she has been doing this for ~ 1 week. She reports this is not working well for her. She reports she has been very anxious and crying and reports insomnia.   She reports her family members have made note to her mood change.   She reports she needs to be on "something" if she is not able to continue Cymbalta  due to lab work.   She denies any thoughts of self harm or harm to others.   She is also requesting a refill on on Valtrex . She reports she has an active outbreak. She reports she has not had one in years and contributes this to mood and med changes.

## 2023-05-09 NOTE — Telephone Encounter (Signed)
 Notified that pt mood symptoms worsening since weaning duloxetine . Will re-start 60 BID dosing. I have reached out to Dr. Monte Antonio to see if it is necessary to wean this medication.

## 2023-05-10 ENCOUNTER — Ambulatory Visit (HOSPITAL_COMMUNITY)
Admission: RE | Admit: 2023-05-10 | Discharge: 2023-05-10 | Disposition: A | Discharge status: Home / Self Care | Source: Ambulatory Visit | Attending: "Endocrinology | Admitting: "Endocrinology

## 2023-05-10 DIAGNOSIS — C3490 Malignant neoplasm of unspecified part of unspecified bronchus or lung: Secondary | ICD-10-CM | POA: Diagnosis not present

## 2023-05-10 DIAGNOSIS — R748 Abnormal levels of other serum enzymes: Secondary | ICD-10-CM | POA: Diagnosis not present

## 2023-05-10 DIAGNOSIS — Z9189 Other specified personal risk factors, not elsewhere classified: Secondary | ICD-10-CM | POA: Diagnosis not present

## 2023-05-10 MED ORDER — TECHNETIUM TC 99M MEDRONATE IV KIT
20.0000 | PACK | Freq: Once | INTRAVENOUS | Status: AC | PRN
  Administered 2023-05-10: 21 via INTRAVENOUS

## 2023-05-10 NOTE — Telephone Encounter (Signed)
 Attempted to call patient to discuss, however no answer.   Will be happy to discuss medication with her when she calls back.

## 2023-05-12 ENCOUNTER — Ambulatory Visit: Payer: Medicare PPO

## 2023-05-12 VITALS — Ht 64.0 in | Wt 158.0 lb

## 2023-05-12 DIAGNOSIS — Z0000 Encounter for general adult medical examination without abnormal findings: Secondary | ICD-10-CM

## 2023-05-12 NOTE — Progress Notes (Signed)
 Because this visit was a virtual/telehealth visit,  certain criteria was not obtained, such a blood pressure, CBG if applicable, and timed get up and go. Any medications not marked as "taking" were not mentioned during the medication reconciliation part of the visit. Any vitals not documented were not able to be obtained due to this being a telehealth visit or patient was unable to self-report a recent blood pressure reading due to a lack of equipment at home via telehealth. Vitals that have been documented are verbally provided by the patient.   Subjective:   [name] is a 63 y.o. who presents for a Medicare Wellness preventive visit.  Visit Complete: Virtual I connected with  Woodward Head on 05/12/23 by a audio enabled telemedicine application and verified that I am speaking with the correct person using two identifiers.  Patient Location: Home  Provider Location: Office/Clinic  I discussed the limitations of evaluation and management by telemedicine. The patient expressed understanding and agreed to proceed.  Vital Signs: Because this visit was a virtual/telehealth visit, some criteria may be missing or patient reported. Any vitals not documented were not able to be obtained and vitals that have been documented are patient reported.  VideoDeclined- This patient declined Librarian, academic. Therefore the visit was completed with audio only.  Persons Participating in Visit: Patient.  AWV Questionnaire: No: Patient Medicare AWV questionnaire was not completed prior to this visit.  Cardiac Risk Factors include: advanced age (>79men, >13 women);dyslipidemia;family history of premature cardiovascular disease;hypertension     Objective:    Today's Vitals   05/12/23 0832  Weight: 158 lb (71.7 kg)  Height: 5\' 4"  (1.626 m)  PainSc: 0-No pain   Body mass index is 27.12 kg/m.     05/12/2023    8:35 AM 03/31/2023   10:17 AM 03/18/2023    9:54 AM 05/19/2022     8:35 AM 05/12/2022   12:53 PM 05/10/2022   11:28 AM 04/13/2022    8:55 AM  Advanced Directives  Does Patient Have a Medical Advance Directive? No No No No No No No  Would patient like information on creating a medical advance directive? No - Patient declined No - Patient declined No - Patient declined No - Patient declined  No - Guardian declined No - Patient declined    Current Medications (verified) Outpatient Encounter Medications as of 05/12/2023  Medication Sig   amitriptyline  (ELAVIL ) 150 MG tablet Take 1 tablet by mouth once daily   amLODipine  (NORVASC ) 10 MG tablet Take 1 tablet (10 mg total) by mouth daily.   atorvastatin  (LIPITOR) 40 MG tablet Take 1 tablet (40 mg total) by mouth daily.   budesonide -formoterol  (SYMBICORT ) 160-4.5 MCG/ACT inhaler Inhale 2 puffs into the lungs 2 (two) times daily.   celecoxib  (CELEBREX ) 200 MG capsule Take 1 capsule (200 mg total) by mouth daily as needed.   DICLOFENAC  PO Take by mouth 2 (two) times daily.   DULoxetine  (CYMBALTA ) 60 MG capsule Take 1 capsule (60 mg total) by mouth 2 (two) times daily.   esomeprazole  (NEXIUM ) 40 MG capsule TAKE 1 CAPSULE BY MOUTH TWICE DAILY BEFORE A MEAL   furosemide  (LASIX ) 20 MG tablet Take 1 tablet (20 mg total) by mouth daily as needed for edema.   hydrocortisone  (ANUSOL -HC) 2.5 % rectal cream Place 1 Application rectally 2 (two) times daily. Up to 14 days at a time.   linaclotide  (LINZESS ) 290 MCG CAPS capsule TAKE 1 CAPSULE BY MOUTH ONCE DAILY  BEFORE BREAKFAST   pregabalin  (LYRICA ) 100 MG capsule Take 1 capsule (100 mg total) by mouth 2 (two) times daily.   No facility-administered encounter medications on file as of 05/12/2023.    Allergies (verified) Promethazine  hcl   History: Past Medical History:  Diagnosis Date   Anxiety    Aortic insufficiency    a. 10/2017 Echo: Mod AI; b. 04/2020 Echo: Mod AI.   Asthma    every now and then.  Wheezing and coughing   Cancer (HCC) 1997   left lung   Chronic  abdominal pain    Chronic back pain    Chronic joint pain    Depression    Diastolic dysfunction    a. 10/2017 Echo: EF 60-65%, GrII DD, mod AI; b. 04/2020 Echo: EF 60-65%, no rwma, GrI DD, nl RV fxn, mod AI.   Essential hypertension    GERD (gastroesophageal reflux disease)    HLD (hyperlipidemia)    Palpitations 01/2013   a. 11/2017 Holter: Occas PACs, rare PVCs. Rare nonsustained atrial tachycardia; b. 01/2018 Event monitor: Predominantly sinus rhythm @ 90 (64-135). No significant arrhythmias.   Shortness of breath dyspnea    with exertion   Past Surgical History:  Procedure Laterality Date   ABDOMINAL HYSTERECTOMY     ABDOMINAL SURGERY     ovarian cyst removal   BALLOON DILATION N/A 02/27/2021   Procedure: BALLOON DILATION;  Surgeon: Vinetta Greening, DO;  [facility]: [facility];  Service: Endoscopy;  Laterality: N/A;   BIOPSY N/A 11/22/2013   Procedure: GASTRIC BIOPSY;  Surgeon: Suzette Espy, MD;  Location: [facility];  Service: Endoscopy;  Laterality: N/A;   BIOPSY  02/27/2021   Procedure: BIOPSY;  Surgeon: Vinetta Greening, DO;  [facility];  Service: Endoscopy;;   BIOPSY  05/12/2022   Procedure: BIOPSY;  Surgeon: Suzette Espy, MD;  [facility];  Service: Endoscopy;;   BLADDER SURGERY  suspension x4   x 4   CARPAL TUNNEL RELEASE Right 07/11/2015   Procedure: RIGHT CARPAL TUNNEL RELEASE;  Surgeon: Alphonso Jean, MD;  Location: [facility];  Service: Orthopedics;  Laterality: Right;   CARPAL TUNNEL RELEASE Left 05/19/2022   Procedure: CARPAL TUNNEL RELEASE;  Surgeon: Marilyn Shropshire, MD;  Location: [facility];  Service: Orthopedics;  Laterality: Left;  or MAC with regional 60   CHOLECYSTECTOMY N/A 04/05/2014   Procedure: LAPAROSCOPIC CHOLECYSTECTOMY;  Surgeon: Alanda Allegra Md, MD;  Location: [facility];  Service: General;  Laterality: N/A;   COCCYGECTOMY N/A 04/11/2015   Procedure: COCCYGECTOMY WITH OSTEOTOMY THROUGH AREA OF  DEFORMITY;  Surgeon: Alphonso Jean, MD;  Location: [facility];  Service: Orthopedics;  Laterality: N/A;   COLONOSCOPY     COLONOSCOPY WITH PROPOFOL  N/A 11/22/2013   Dr. Riley Cheadle: Grade 3 and 4/internal hemorrhoids?"likely source of hematochezia Normal colonoscopy otherwise.    COLONOSCOPY WITH PROPOFOL  N/A 09/15/2017   Dr. Narvis Bad: Hemorrhoids, next colonoscopy 10 years   ESOPHAGOGASTRODUODENOSCOPY (EGD) WITH PROPOFOL  N/A 11/22/2013   Dr. Riley Cheadle: Incomplete Schatzki's ring dilated and disrupted as described above.  Small hiatal hernia. Focally abnormal gastric mucosa of uncertain significance status post biopsy, reactive gastropathy, negative H.pylori   ESOPHAGOGASTRODUODENOSCOPY (EGD) WITH PROPOFOL  N/A 08/04/2015   Dr. Riley Cheadle: mild Schatzki's ring s/p dilation, small hiatal hernia    ESOPHAGOGASTRODUODENOSCOPY (EGD) WITH PROPOFOL  N/A 03/17/2017   Mild Schatki's ring s/p dilation, small hiatal hernia, otherwise normal   ESOPHAGOGASTRODUODENOSCOPY (EGD) WITH PROPOFOL  N/A 09/18/2018  Dr. Riley Cheadle: Schatzki ring status post dilation and disruption.  Hiatal hernia.   ESOPHAGOGASTRODUODENOSCOPY (EGD) WITH PROPOFOL  N/A 12/20/2019   non-obstructing Schatzki's ring s/p dilation with 56, 58, and 60 F.   ESOPHAGOGASTRODUODENOSCOPY (EGD) WITH PROPOFOL  N/A 02/27/2021   Procedure: ESOPHAGOGASTRODUODENOSCOPY (EGD) WITH PROPOFOL ;  Surgeon: Vinetta Greening, DO;  [facility];  Service: Endoscopy;  Laterality: N/A;  11:30am   ESOPHAGOGASTRODUODENOSCOPY (EGD) WITH PROPOFOL  N/A 05/12/2022   Procedure: ESOPHAGOGASTRODUODENOSCOPY (EGD) WITH PROPOFOL ;  Surgeon: Suzette Espy, MD;  [facility];  Service: Endoscopy;  Laterality: N/A;  8:30 am   ESOPHAGOGASTRODUODENOSCOPY (EGD) WITH PROPOFOL  N/A 03/31/2023   Procedure: ESOPHAGOGASTRODUODENOSCOPY (EGD) WITH PROPOFOL ;  Surgeon: Suzette Espy, MD;  [facility];  Service: Endoscopy;  Laterality: N/A;  900am, asa 1/2   EXCISION VAGINAL CYST Left  06/20/2012   Procedure: EXCISION LEFT LABIAL CYST;  Surgeon: Albino Hum, MD;  Location: [facility];  Service: Gynecology;  Laterality: Left;   GANGLION CYST EXCISION Left 05/19/2022   Procedure: left volar carpal ganglion cyst excision;  Surgeon: Marilyn Shropshire, MD;  Location: [facility];  Service: Orthopedics;  Laterality: Left;  or MAC with regional 60   LOBECTOMY Left    LUNG CANCER SURGERY  1997   age 44, resection only   MALONEY DILATION N/A 11/22/2013   Procedure: MALONEY DILATION;  Surgeon: Suzette Espy, MD;  [facility]: [facility];  Service: Endoscopy;  Laterality: N/A;  56   MALONEY DILATION N/A 08/04/2015   Procedure: Londa Rival DILATION;  Surgeon: Suzette Espy, MD;  [facility];  Service: Endoscopy;  Laterality: N/A;   MALONEY DILATION N/A 03/17/2017   Procedure: Londa Rival DILATION;  Surgeon: Suzette Espy, MD;  [facility];  Service: Endoscopy;  Laterality: N/A;   MALONEY DILATION N/A 09/18/2018   Procedure: Londa Rival DILATION;  Surgeon: Suzette Espy, MD;  [facility];  Service: Endoscopy;  Laterality: N/A;   MALONEY DILATION N/A 12/20/2019   Procedure: Londa Rival DILATION;  Surgeon: Suzette Espy, MD;  [facility];  Service: Endoscopy;  Laterality: N/A;   MALONEY DILATION N/A 05/12/2022   Procedure: Londa Rival DILATION;  Surgeon: Suzette Espy, MD;  [facility];  Service: Endoscopy;  Laterality: N/A;   MALONEY DILATION N/A 03/31/2023   Procedure: DILATION, ESOPHAGUS, USING MALONEY DILATOR;  Surgeon: Suzette Espy, MD;  [facility];  Service: Endoscopy;  Laterality: N/A;   MASS EXCISION N/A 07/23/2014   Procedure: EXCISIONAL BIOPSY NODULE LEFT PARACOCCYGEAL AREA;  Surgeon: Alphonso Jean, MD;  [facility];  Service: Orthopedics;  Laterality: N/A;   MASS EXCISION Left 10/10/2015   Procedure: EXCISIONAL BIOPSY OF LEFT DORSORADIAL FOREARM MASS LIPOMA;  Surgeon: Alphonso Jean, MD;  Location: [facility];  Service: Orthopedics;  Laterality: Left;   TUBAL LIGATION     Family History  Problem Relation Age of Onset   Diabetes Other    Heart disease Other        has pace maker   Hypertension Mother    Kidney disease Mother    Hypertension Father    Kidney disease Father    Heart disease Father    Cancer Father        leukemia   Hypertension Sister    Hypertension Sister    Colon cancer Neg Hx    Social History   Socioeconomic History   Marital status: Married  Spouse name: Carlo   Number of children: 3   Years of education: Not on file   Highest education level: Not on file  Occupational History   Occupation: Disability  Tobacco Use   Smoking status: Former    Current packs/day: 0.00    Average packs/day: 1.5 packs/day for 25.0 years (37.5 ttl pk-yrs)    Types: Cigarettes    Start date: 04/07/1970    Quit date: 04/07/1995    Years since quitting: 28.1   Smokeless tobacco: Never  Vaping Use   Vaping status: Never Used  Substance and Sexual Activity   Alcohol use: No   Drug use: No   Sexual activity: Not Currently    Birth control/protection: Surgical    Comment: hyst  Other Topics Concern   Not on file  Social History Narrative   Not on file   Social Drivers of Health   Financial Resource Strain: Low Risk  (05/12/2023)   Overall Financial Resource Strain (CARDIA)    Difficulty of Paying Living Expenses: Not hard at all  Food Insecurity: No Food Insecurity (05/12/2023)   Hunger Vital Sign    Worried About Running Out of Food in the Last Year: Never true    Ran Out of Food in the Last Year: Never true  Transportation Needs: No Transportation Needs (05/12/2023)   PRAPARE - Administrator, Civil Service (Medical): No    Lack of Transportation (Non-Medical): No  Physical Activity: Sufficiently Active (05/12/2023)   Exercise Vital Sign    Days of Exercise per Week: 5 days    Minutes of Exercise per Session: 30 min  Stress: No Stress  Concern Present (05/12/2023)   [employer] of Occupational Health - Occupational Stress Questionnaire    Feeling of Stress : Only a little  Social Connections: Moderately Isolated (05/12/2023)   Social Connection and Isolation Panel [NHANES]    Frequency of Communication with Friends and Family: More than three times a week    Frequency of Social Gatherings with Friends and Family: Three times a week    Attends Religious Services: Never    Active Member of Clubs or Organizations: No    Attends Banker Meetings: Never    Marital Status: Married    Tobacco Counseling Counseling given: Not Answered    Clinical Intake:  Pre-visit preparation completed: Yes  Pain : No/denies pain Pain Score: 0-No pain     BMI - recorded: 27.12 Nutritional Status: BMI of 19-24  Normal Nutritional Risks: None Diabetes: No  Lab Results  Component Value Date   HGBA1C 5.9 05/18/2021   HGBA1C 5.8 (A) 11/20/2020   HGBA1C 5.5 12/10/2017     How often do you need to have someone help you when you read instructions, pamphlets, or other written materials from your doctor or pharmacy?: 1 - Never  Interpreter Needed?: No  Information entered by :: Melisse Caetano N. Kweli Grassel, LPN.   Activities of Daily Living     05/12/2023    8:35 AM 05/19/2022    8:38 AM  In your present state of health, do you have any difficulty performing the following activities:  Hearing? 0 0  Vision? 0 0  Difficulty concentrating or making decisions? 0 0  Walking or climbing stairs? 0 0  Dressing or bathing? 0 0  Doing errands, shopping? 0   Preparing Food and eating ? N   Using the Toilet? N   In the past six months, have you accidently leaked urine? N  Do you have problems with loss of bowel control? N   Managing your Medications? N   Managing your Finances? N   Housekeeping or managing your Housekeeping? N     Patient Care Team: Albin Huh, MD as PCP - General (Family Medicine) Amanda Jungling Joyceann No, MD as PCP - Cardiology (Cardiology) Riley Cheadle Windsor Hatcher, MD as Consulting Physician (Gastroenterology) [facility] as Consulting Physician (Optometry)  Indicate any recent Medical Services you may have received from other than [facility] providers in the past year (date may be approximate).     Assessment:   This is a routine wellness examination for Jen[place]ieve.  Hearing/Vision screen Hearing Screening - Comments:: Denies hearing difficulties.   Vision Screening - Comments:: Wears rx glasses - up to date with routine eye exams with  [facility]   Goals Addressed   None    Depression Screen     05/12/2023    8:36 AM 04/21/2022    3:27 PM 04/13/2022    8:38 AM 03/05/2022    8:34 AM 01/21/2022    8:27 AM 05/18/2021    9:08 AM 02/09/2021    8:37 AM  PHQ 2/9 Scores  PHQ - 2 Score 0 2 6 6 6 2  0  PHQ- 9 Score 0 11  18 18 2 1     Fall Risk     05/12/2023    8:33 AM 11/16/2022   10:40 AM 04/13/2022    8:39 AM 01/21/2022    8:27 AM 05/18/2021    9:08 AM  Fall Risk   Falls in the past year? 0 0 0 0 0  Number falls in past yr: 0 0 0 0 0  Injury with Fall? 0 1 0 0 0  Risk for fall due to : No Fall Risks No Fall Risks No Fall Risks    Follow up Falls prevention discussed;Falls evaluation completed Falls evaluation completed Falls evaluation completed      MEDICARE RISK AT HOME:  Medicare Risk at Home Any stairs in or around the home?: No If so, are there any without handrails?: No Home free of loose throw rugs in walkways, pet beds, electrical cords, etc?: Yes Adequate lighting in your home to reduce risk of falls?: Yes Life alert?: No Use of a cane, walker or w/c?: No Grab bars in the bathroom?: No Shower chair or bench in shower?: No Elevated toilet seat or a handicapped toilet?: No  TIMED UP AND GO:  Was the test performed?  No  Cognitive Function: 6CIT completed    05/12/2023    8:41 AM  MMSE - Mini Mental State Exam  Not completed: Unable to complete         05/12/2023    8:33 AM 04/13/2022    8:44 AM  6CIT Screen  What Year? 0 points 0 points  What month? 0 points 0 points  What time? 0 points 0 points  Count back from 20 0 points 0 points  Months in reverse 0 points 0 points  Repeat phrase 0 points 2 points  Total Score 0 points 2 points    Immunizations Immunization History  Administered Date(s) Administered   Influenza Whole 10/24/2007   Td 09/19/2002    Screening Tests Health Maintenance  Topic Date Due   COVID-19 Vaccine (1) Never done   Pneumococcal Vaccine 46-27 Years old (1 of 2 - PCV) Never done   Zoster Vaccines- Shingrix (1 of 2) Never done   DTaP/Tdap/Td (2 - Tdap)  09/18/2012   INFLUENZA VACCINE  08/19/2023   Medicare Annual Wellness (AWV)  05/11/2024   MAMMOGRAM  11/11/2024   Colonoscopy  09/16/2027   Hepatitis C Screening  Completed   HIV Screening  Completed   HPV VACCINES  Aged Out   Meningococcal B Vaccine  Aged Out    Health Maintenance  Health Maintenance Due  Topic Date Due   COVID-19 Vaccine (1) Never done   Pneumococcal Vaccine 16-52 Years old (1 of 2 - PCV) Never done   Zoster Vaccines- Shingrix (1 of 2) Never done   DTaP/Tdap/Td (2 - Tdap) 09/18/2012   Health Maintenance Items Addressed: Yes; Patient declined vaccines. Patient is current with mammogram and colonoscopy.  Additional Screening:  Vision Screening: Recommended annual ophthalmology exams for early detection of glaucoma and other disorders of the eye.  Dental Screening: Recommended annual dental exams for proper oral hygiene  Community Resource Referral / Chronic Care Management: CRR required this visit?  No   CCM required this visit?  No     Plan:     I have personally reviewed and noted the following in the patient's chart:   Medical and social history Use of alcohol, tobacco or illicit drugs  Current medications and supplements including opioid prescriptions. Patient is not currently taking opioid  prescriptions. Functional ability and status Nutritional status Physical activity Advanced directives List of other physicians Hospitalizations, surgeries, and ER visits in previous 12 months Vitals Screenings to include cognitive, depression, and falls Referrals and appointments  In addition, I have reviewed and discussed with patient certain preventive protocols, quality metrics, and best practice recommendations. A written personalized care plan for preventive services as well as general preventive health recommendations were provided to patient.     Margette Sheldon, LPN   1/61/0960   After Visit Summary: (MyChart) Due to this being a telephonic visit, the after visit summary with patients personalized plan was offered to patient via MyChart   Notes: Nothing significant to report at this time.

## 2023-05-12 NOTE — Patient Instructions (Addendum)
 Ms. Schlottman , Thank you for taking time to come for your Medicare Wellness Visit. I appreciate your ongoing commitment to your health goals. Please review the following plan we discussed and let me know if I can assist you in the future.   Referrals/Orders/Follow-Ups/Clinician Recommendations: Yes, keep maintaining your health by keeping your appointments with Dr.  Albin Huh and any specialists that you may see.  Call us  if you need anything.  Have a great year!!!!  This is a list of the screening recommended for you and due dates:  Health Maintenance  Topic Date Due   COVID-19 Vaccine (1) Never done   Pneumococcal Vaccination (1 of 2 - PCV) Never done   Zoster (Shingles) Vaccine (1 of 2) Never done   DTaP/Tdap/Td vaccine (2 - Tdap) 09/18/2012   Flu Shot  08/19/2023   Medicare Annual Wellness Visit  05/11/2024   Mammogram  11/11/2024   Colon Cancer Screening  09/16/2027   Hepatitis C Screening  Completed   HIV Screening  Completed   HPV Vaccine  Aged Out   Meningitis B Vaccine  Aged Out    Advanced directives: (Declined) Advance directive discussed with you today. Even though you declined this today, please call our office should you change your mind, and we can give you the proper paperwork for you to fill out.  Next Medicare Annual Wellness Visit scheduled for next year: Yes, It was nice speaking with you today! Your next Annual Wellness Visit is scheduled for 05/14/2024 at 8:30 a.m. via PHONE VISIT. If you need to reschedule or cancel, please call [phone]. [facility])

## 2023-05-26 ENCOUNTER — Encounter: Payer: Self-pay | Admitting: Internal Medicine

## 2023-05-26 ENCOUNTER — Telehealth: Payer: Self-pay | Admitting: Family Medicine

## 2023-05-26 NOTE — Telephone Encounter (Signed)
 Notified by Dr. Nida, Gebreselassie that pt is ok to take Cymbalta . He actually wants her to hold her diclofenac .   Called pt and updated her. She is agreeable to holding her diclofenac .

## 2023-05-30 ENCOUNTER — Other Ambulatory Visit: Payer: Self-pay | Admitting: Family Medicine

## 2023-05-31 ENCOUNTER — Other Ambulatory Visit: Payer: Self-pay | Admitting: *Deleted

## 2023-05-31 DIAGNOSIS — E785 Hyperlipidemia, unspecified: Secondary | ICD-10-CM

## 2023-06-01 MED ORDER — ATORVASTATIN CALCIUM 40 MG PO TABS: 40.0000 mg | ORAL_TABLET | Freq: Every day | ORAL | 3 refills | Status: AC

## 2023-06-06 DIAGNOSIS — E559 Vitamin D deficiency, unspecified: Secondary | ICD-10-CM | POA: Diagnosis not present

## 2023-06-06 DIAGNOSIS — R748 Abnormal levels of other serum enzymes: Secondary | ICD-10-CM | POA: Diagnosis not present

## 2023-06-06 DIAGNOSIS — Z9189 Other specified personal risk factors, not elsewhere classified: Secondary | ICD-10-CM | POA: Diagnosis not present

## 2023-06-07 ENCOUNTER — Other Ambulatory Visit: Payer: Self-pay

## 2023-06-07 ENCOUNTER — Telehealth: Payer: Self-pay | Admitting: "Endocrinology

## 2023-06-07 DIAGNOSIS — R7989 Other specified abnormal findings of blood chemistry: Secondary | ICD-10-CM

## 2023-06-07 DIAGNOSIS — E785 Hyperlipidemia, unspecified: Secondary | ICD-10-CM

## 2023-06-07 DIAGNOSIS — R748 Abnormal levels of other serum enzymes: Secondary | ICD-10-CM

## 2023-06-07 DIAGNOSIS — E876 Hypokalemia: Secondary | ICD-10-CM

## 2023-06-07 DIAGNOSIS — R768 Other specified abnormal immunological findings in serum: Secondary | ICD-10-CM

## 2023-06-07 LAB — COMPREHENSIVE METABOLIC PANEL WITH GFR
ALT: 10 IU/L (ref 0–32)
AST: 12 IU/L (ref 0–40)
Albumin: 4.1 g/dL (ref 3.8–4.9)
Alkaline Phosphatase: 194 IU/L — ABNORMAL HIGH (ref 44–121)
BUN/Creatinine Ratio: 7 — ABNORMAL LOW (ref 12–28)
BUN: 5 mg/dL — ABNORMAL LOW (ref 8–27)
Bilirubin Total: 0.3 mg/dL (ref 0.0–1.2)
CO2: 24 mmol/L (ref 20–29)
Calcium: 9.5 mg/dL (ref 8.7–10.3)
Chloride: 101 mmol/L (ref 96–106)
Creatinine, Ser: 0.71 mg/dL (ref 0.57–1.00)
Globulin, Total: 3.3 g/dL (ref 1.5–4.5)
Glucose: 99 mg/dL (ref 70–99)
Potassium: 4.2 mmol/L (ref 3.5–5.2)
Sodium: 140 mmol/L (ref 134–144)
Total Protein: 7.4 g/dL (ref 6.0–8.5)
eGFR: 97 mL/min/{1.73_m2} (ref >59)

## 2023-06-07 LAB — VITAMIN D 25 HYDROXY (VIT D DEFICIENCY, FRACTURES): Vit D, 25-Hydroxy: 8.5 ng/mL — ABNORMAL LOW (ref 30.0–100.0)

## 2023-06-07 NOTE — Telephone Encounter (Signed)
 Pt needs a DEXA. Can you fax order to [facility]

## 2023-06-07 NOTE — Telephone Encounter (Signed)
 Order for DEXA scan faxed to [facility].

## 2023-06-09 ENCOUNTER — Other Ambulatory Visit (HOSPITAL_COMMUNITY): Payer: Self-pay | Admitting: "Endocrinology

## 2023-06-09 DIAGNOSIS — R748 Abnormal levels of other serum enzymes: Secondary | ICD-10-CM

## 2023-06-14 ENCOUNTER — Ambulatory Visit (HOSPITAL_COMMUNITY)
Admission: RE | Admit: 2023-06-14 | Discharge: 2023-06-14 | Disposition: A | Discharge status: Home / Self Care | Source: Ambulatory Visit | Attending: "Endocrinology | Admitting: "Endocrinology

## 2023-06-14 DIAGNOSIS — Z780 Asymptomatic menopausal state: Secondary | ICD-10-CM | POA: Diagnosis not present

## 2023-06-14 DIAGNOSIS — M8589 Other specified disorders of bone density and structure, multiple sites: Secondary | ICD-10-CM | POA: Insufficient documentation

## 2023-06-14 DIAGNOSIS — M85852 Other specified disorders of bone density and structure, left thigh: Secondary | ICD-10-CM | POA: Diagnosis not present

## 2023-06-14 DIAGNOSIS — R748 Abnormal levels of other serum enzymes: Secondary | ICD-10-CM | POA: Diagnosis not present

## 2023-06-14 DIAGNOSIS — M85851 Other specified disorders of bone density and structure, right thigh: Secondary | ICD-10-CM | POA: Diagnosis not present

## 2023-06-14 DIAGNOSIS — Z13820 Encounter for screening for osteoporosis: Secondary | ICD-10-CM | POA: Insufficient documentation

## 2023-06-22 ENCOUNTER — Telehealth: Payer: Self-pay | Admitting: "Endocrinology

## 2023-06-22 NOTE — Telephone Encounter (Signed)
 Patient is asking if her visit on 6/11 can be mychart? She has no transportation

## 2023-06-22 NOTE — Telephone Encounter (Signed)
 Patient said she will try to find a ride if not she will call back for mychart visit

## 2023-06-29 ENCOUNTER — Ambulatory Visit (INDEPENDENT_AMBULATORY_CARE_PROVIDER_SITE_OTHER): Admitting: "Endocrinology

## 2023-06-29 ENCOUNTER — Encounter: Payer: Self-pay | Admitting: "Endocrinology

## 2023-06-29 VITALS — BP 132/56 | HR 76 | Ht 64.0 in | Wt 155.2 lb

## 2023-06-29 DIAGNOSIS — E559 Vitamin D deficiency, unspecified: Secondary | ICD-10-CM | POA: Diagnosis not present

## 2023-06-29 DIAGNOSIS — R748 Abnormal levels of other serum enzymes: Secondary | ICD-10-CM

## 2023-06-29 MED ORDER — VITAMIN D (ERGOCALCIFEROL) 1.25 MG (50000 UNIT) PO CAPS: 50000.0000 [IU] | ORAL_CAPSULE | ORAL | 0 refills | Status: DC

## 2023-06-29 MED ORDER — VITAMIN D3 125 MCG (5000 UT) PO CAPS: 5000.0000 [IU] | ORAL_CAPSULE | Freq: Every day | ORAL | 1 refills | Status: DC

## 2023-06-29 NOTE — Progress Notes (Signed)
 06/29/2023, 8:16 AM   Endocrinology follow-up note  Subjective:    Patient ID: [name], female    DOB: 1960-02-07, PCP Albin Huh, MD   Past Medical History:  Diagnosis Date   Anxiety    Aortic insufficiency    a. 10/2017 Echo: Mod AI; b. 04/2020 Echo: Mod AI.   Asthma    every now and then.  Wheezing and coughing   Cancer (HCC) 1997   left lung   Chronic abdominal pain    Chronic back pain    Chronic joint pain    Depression    Diastolic dysfunction    a. 10/2017 Echo: EF 60-65%, GrII DD, mod AI; b. 04/2020 Echo: EF 60-65%, no rwma, GrI DD, nl RV fxn, mod AI.   Essential hypertension    GERD (gastroesophageal reflux disease)    HLD (hyperlipidemia)    Palpitations 01/2013   a. 11/2017 Holter: Occas PACs, rare PVCs. Rare nonsustained atrial tachycardia; b. 01/2018 Event monitor: Predominantly sinus rhythm @ 90 (64-135). No significant arrhythmias.   Shortness of breath dyspnea    with exertion   Past Surgical History:  Procedure Laterality Date   ABDOMINAL HYSTERECTOMY     ABDOMINAL SURGERY     ovarian cyst removal   BALLOON DILATION N/A 02/27/2021   Procedure: BALLOON DILATION;  Surgeon: Vinetta Greening, DO;  [facility];  Service: Endoscopy;  Laterality: N/A;   BIOPSY N/A 11/22/2013   Procedure: GASTRIC BIOPSY;  Surgeon: Suzette Espy, MD;  Location: [facility];  Service: Endoscopy;  Laterality: N/A;   BIOPSY  02/27/2021   Procedure: BIOPSY;  Surgeon: Vinetta Greening, DO;  [facility];  Service: Endoscopy;;   BIOPSY  05/12/2022   Procedure: BIOPSY;  Surgeon: Suzette Espy, MD;  [facility];  Service: Endoscopy;;   BLADDER SURGERY  suspension x4   x 4   CARPAL TUNNEL RELEASE Right 07/11/2015   Procedure: RIGHT CARPAL TUNNEL RELEASE;  Surgeon: Alphonso Jean, MD;  Location: [facility];  Service: Orthopedics;  Laterality: Right;   CARPAL TUNNEL RELEASE Left  05/19/2022   Procedure: CARPAL TUNNEL RELEASE;  Surgeon: Marilyn Shropshire, MD;  Location:  [facility]ERY [facility];  Service: Orthopedics;  Laterality: Left;  or MAC with regional 60   CHOLECYSTECTOMY N/A 04/05/2014   Procedure: LAPAROSCOPIC CHOLECYSTECTOMY;  Surgeon: Alanda Allegra Md, MD;  Location: [facility];  Service: General;  Laterality: N/A;   COCCYGECTOMY N/A 04/11/2015   Procedure: COCCYGECTOMY WITH OSTEOTOMY THROUGH AREA OF DEFORMITY;  Surgeon: Alphonso Jean, MD;  [facility];  Service: Orthopedics;  Laterality: N/A;   COLONOSCOPY     COLONOSCOPY WITH PROPOFOL  N/A 11/22/2013   Dr. Riley Cheadle: Grade 3 and 4/internal hemorrhoids?"likely source of hematochezia Normal colonoscopy otherwise.    COLONOSCOPY WITH PROPOFOL  N/A 09/15/2017   Dr. Narvis Bad: Hemorrhoids, next colonoscopy 10 years   ESOPHAGOGASTRODUODENOSCOPY (EGD) WITH PROPOFOL  N/A 11/22/2013   Dr. Riley Cheadle: Incomplete Schatzki's ring dilated and disrupted as described above.  Small hiatal hernia. Focally abnormal gastric mucosa of uncertain significance status post biopsy, reactive gastropathy, negative H.pylori   ESOPHAGOGASTRODUODENOSCOPY (EGD) WITH PROPOFOL  N/A 08/04/2015   Dr. Riley Cheadle: mild Schatzki's ring  s/p dilation, small hiatal hernia    ESOPHAGOGASTRODUODENOSCOPY (EGD) WITH PROPOFOL  N/A 03/17/2017   Mild Schatki's ring s/p dilation, small hiatal hernia, otherwise normal   ESOPHAGOGASTRODUODENOSCOPY (EGD) WITH PROPOFOL  N/A 09/18/2018   Dr. Riley Cheadle: Schatzki ring status post dilation and disruption.  Hiatal hernia.   ESOPHAGOGASTRODUODENOSCOPY (EGD) WITH PROPOFOL  N/A 12/20/2019   non-obstructing Schatzki's ring s/p dilation with 56, 58, and 60 F.   ESOPHAGOGASTRODUODENOSCOPY (EGD) WITH PROPOFOL  N/A 02/27/2021   Procedure: ESOPHAGOGASTRODUODENOSCOPY (EGD) WITH PROPOFOL ;  Surgeon: Vinetta Greening, DO;  [facility];  Service: Endoscopy;  Laterality: N/A;  11:30am   ESOPHAGOGASTRODUODENOSCOPY (EGD) WITH PROPOFOL  N/A  05/12/2022   Procedure: ESOPHAGOGASTRODUODENOSCOPY (EGD) WITH PROPOFOL ;  Surgeon: Suzette Espy, MD;  [facility];  Service: Endoscopy;  Laterality: N/A;  8:30 am   ESOPHAGOGASTRODUODENOSCOPY (EGD) WITH PROPOFOL  N/A 03/31/2023   Procedure: ESOPHAGOGASTRODUODENOSCOPY (EGD) WITH PROPOFOL ;  Surgeon: Suzette Espy, MD;  [facility];  Service: Endoscopy;  Laterality: N/A;  900am, asa 1/2   EXCISION VAGINAL CYST Left 06/20/2012   Procedure: EXCISION LEFT LABIAL CYST;  Surgeon: Albino Hum, MD;  Location: [facility];  Service: Gynecology;  Laterality: Left;   GANGLION CYST EXCISION Left 05/19/2022   Procedure: left volar carpal ganglion cyst excision;  Surgeon: Marilyn Shropshire, MD;  Location: [facility];  Service: Orthopedics;  Laterality: Left;  or MAC with regional 60   LOBECTOMY Left    LUNG CANCER SURGERY  1997   age 4, resection only   MALONEY DILATION N/A 11/22/2013   Procedure: MALONEY DILATION;  Surgeon: Suzette Espy, MD;  [facility];  Service: Endoscopy;  Laterality: N/A;  56   MALONEY DILATION N/A 08/04/2015   Procedure: Londa Rival DILATION;  Surgeon: Suzette Espy, MD;  [facility];  Service: Endoscopy;  Laterality: N/A;   MALONEY DILATION N/A 03/17/2017   Procedure: Londa Rival DILATION;  Surgeon: Suzette Espy, MD;  [facility];  Service: Endoscopy;  Laterality: N/A;   MALONEY DILATION N/A 09/18/2018   Procedure: Londa Rival DILATION;  Surgeon: Suzette Espy, MD;  [facility];  Service: Endoscopy;  Laterality: N/A;   MALONEY DILATION N/A 12/20/2019   Procedure: Londa Rival DILATION;  Surgeon: Suzette Espy, MD;  [facility];  Service: Endoscopy;  Laterality: N/A;   MALONEY DILATION N/A 05/12/2022   Procedure: Londa Rival DILATION;  Surgeon: Suzette Espy, MD;  [facility];  Service: Endoscopy;  Laterality: N/A;   MALONEY DILATION N/A 03/31/2023   Procedure: DILATION, ESOPHAGUS, USING  MALONEY DILATOR;  Surgeon: Suzette Espy, MD;  [facility];  Service: Endoscopy;  Laterality: N/A;   MASS EXCISION N/A 07/23/2014   Procedure: EXCISIONAL BIOPSY NODULE LEFT PARACOCCYGEAL AREA;  Surgeon: Alphonso Jean, MD;  [facility];  Service: Orthopedics;  Laterality: N/A;   MASS EXCISION Left 10/10/2015   Procedure: EXCISIONAL BIOPSY OF LEFT DORSORADIAL FOREARM MASS LIPOMA;  Surgeon: Alphonso Jean, MD;  Location: [facility];  Service: Orthopedics;  Laterality: Left;   TUBAL LIGATION     Social History   Socioeconomic History   Marital status: Married    Spouse name: Carlo   Number of children: 3   Years of education: Not on file   Highest education level: Not on file  Occupational History   Occupation: Disability  Tobacco Use   Smoking status: Former    Current packs/day: 0.00  Average packs/day: 1.5 packs/day for 25.0 years (37.5 ttl pk-yrs)    Types: Cigarettes    Start date: 04/07/1970    Quit date: 04/07/1995    Years since quitting: 28.2   Smokeless tobacco: Never  Vaping Use   Vaping status: Never Used  Substance and Sexual Activity   Alcohol use: No   Drug use: No   Sexual activity: Not Currently    Birth control/protection: Surgical    Comment: hyst  Other Topics Concern   Not on file  Social History Narrative   Not on file   Social Drivers of Health   Financial Resource Strain: Low Risk  (05/12/2023)   Overall Financial Resource Strain (CARDIA)    Difficulty of Paying Living Expenses: Not hard at all  Food Insecurity: No Food Insecurity (05/12/2023)   Hunger Vital Sign    Worried About Running Out of Food in the Last Year: Never true    Ran Out of Food in the Last Year: Never true  Transportation Needs: No Transportation Needs (05/12/2023)   PRAPARE - Administrator, Civil Service (Medical): No    Lack of Transportation (Non-Medical): No  Physical Activity: Sufficiently Active (05/12/2023)   Exercise Vital Sign     Days of Exercise per Week: 5 days    Minutes of Exercise per Session: 30 min  Stress: No Stress Concern Present (05/12/2023)   [employer] of Occupational Health - Occupational Stress Questionnaire    Feeling of Stress : Only a little  Social Connections: Moderately Isolated (05/12/2023)   Social Connection and Isolation Panel [NHANES]    Frequency of Communication with Friends and Family: More than three times a week    Frequency of Social Gatherings with Friends and Family: Three times a week    Attends Religious Services: Never    Active Member of Clubs or Organizations: No    Attends Engineer, structural: Never    Marital Status: Married   Family History  Problem Relation Age of Onset   Diabetes Other    Heart disease Other        has pace maker   Hypertension Mother    Kidney disease Mother    Hypertension Father    Kidney disease Father    Heart disease Father    Cancer Father        leukemia   Hypertension Sister    Hypertension Sister    Colon cancer Neg Hx    Outpatient Encounter Medications as of 06/29/2023  Medication Sig   Cholecalciferol  (VITAMIN D3) 125 MCG (5000 UT) CAPS Take 1 capsule (5,000 Units total) by mouth daily.   Vitamin D , Ergocalciferol , (DRISDOL) 1.25 MG (50000 UNIT) CAPS capsule Take 1 capsule (50,000 Units total) by mouth every 7 (seven) days.   amitriptyline  (ELAVIL ) 150 MG tablet Take 1 tablet by mouth once daily   amLODipine  (NORVASC ) 10 MG tablet Take 1 tablet (10 mg total) by mouth daily.   atorvastatin  (LIPITOR) 40 MG tablet Take 1 tablet (40 mg total) by mouth daily.   budesonide -formoterol  (SYMBICORT ) 160-4.5 MCG/ACT inhaler Inhale 2 puffs into the lungs 2 (two) times daily.   celecoxib  (CELEBREX ) 200 MG capsule Take 1 capsule (200 mg total) by mouth daily as needed.   DICLOFENAC  PO Take by mouth 2 (two) times daily.   DULoxetine  (CYMBALTA ) 60 MG capsule Take 1 capsule (60 mg total) by mouth 2 (two) times daily.    esomeprazole  (NEXIUM ) 40 MG capsule TAKE 1 CAPSULE  BY MOUTH TWICE DAILY BEFORE A MEAL   furosemide  (LASIX ) 20 MG tablet Take 1 tablet (20 mg total) by mouth daily as needed for edema.   hydrocortisone  (ANUSOL -HC) 2.5 % rectal cream Place 1 Application rectally 2 (two) times daily. Up to 14 days at a time.   linaclotide  (LINZESS ) 290 MCG CAPS capsule TAKE 1 CAPSULE BY MOUTH ONCE DAILY BEFORE BREAKFAST   pregabalin  (LYRICA ) 100 MG capsule Take 1 capsule by mouth twice daily   No facility-administered encounter medications on file as of 06/29/2023.   ALLERGIES: Allergies  Allergen Reactions   Promethazine  Hcl Nausea And Vomiting and Other (See Comments)    Makes my Trevino hurt really bad too    VACCINATION STATUS: Immunization History  Administered Date(s) Administered   Influenza Whole 10/24/2007   Td 09/19/2002    HPI Margorie A Rathod is 63 y.o. female who presents today with a medical history as above. she is being seen in follow-up after she was seen consultation for elevated alkaline phosphatase requested by Albin Huh, MD.  Patient has multiple medical problems including lung cancer status postsurgical treatment in the remote past.   She also has medical history of resection of benign bone tumor from her coccygeal region. She is a former smoker with COPD/asthma. For the last 2 years she did have mild to moderate elevation of alkaline phosphatase with some rare interval improvement.  She was worked up by GI pointing towards unlikely hepatic or origin.  She did have normal GGT. She does not have any prior history of Paget's disease. She is documented to have steatohepatitis.  She is on polypharmacy including atorvastatin , duloxetine , amitriptyline ,  amlodipine , celecoxib  diclofenac ,  Linzess , esomeprazole ,  furosemide , budesonide . - She presents with previsit repeat labs showing persistent elevation of alk phos at 194, profound vitamin D  deficiency of 8.  She previously had  hypophosphatemia and hypomagnesemia. She does not have interval acute developments.   Review of Systems  Constitutional: +mildly fluctuating body weight ,  no fatigue, no subjective hyperthermia, no subjective hypothermia Eyes: no blurry vision, no xerophthalmia ENT: no sore throat, no nodules palpated in throat, no dysphagia/odynophagia, no hoarseness Cardiovascular: no Chest Pain, no Shortness of Breath, no palpitations, no leg swelling Respiratory: + cough, + wheezes, no shortness of breath Gastrointestinal: no Nausea/Vomiting/Diarhhea Musculoskeletal: + muscle/ +joint aches Skin: no rashes Neurological: no tremors, no numbness, no tingling, no dizziness Psychiatric: no depression, no anxiety  Objective:       06/29/2023    8:01 AM 05/12/2023    8:32 AM 04/29/2023   10:48 AM  Vitals with BMI  Height 5' 4 5' 4 5' 4  Weight 155 lbs 3 oz 158 lbs 157 lbs  BMI 26.63 27.11 26.94  Systolic 132  130  Diastolic 56  72  Pulse 76  68    BP (!) 132/56   Pulse 76   Ht 5' 4 (1.626 m)   Wt 155 lb 3.2 oz (70.4 kg)   BMI 26.64 kg/m   Wt Readings from Last 3 Encounters:  06/29/23 155 lb 3.2 oz (70.4 kg)  05/12/23 158 lb (71.7 kg)  04/29/23 157 lb (71.2 kg)    Physical Exam  Constitutional:  Body mass index is 26.64 kg/m.,  not in acute distress, normal state of mind Eyes: PERRLA, EOMI, no exophthalmos   CMP ( most recent) CMP     Component Value Date/Time   NA 140 06/06/2023 0809   K 4.2 06/06/2023 0809   CL  101 06/06/2023 0809   CO2 24 06/06/2023 0809   GLUCOSE 99 06/06/2023 0809   GLUCOSE 92 03/18/2023 1005   BUN 5 (L) 06/06/2023 0809   CREATININE 0.71 06/06/2023 0809   CREATININE 0.85 03/16/2021 0747   CALCIUM  9.5 06/06/2023 0809   PROT 7.4 06/06/2023 0809   ALBUMIN 4.1 06/06/2023 0809   AST 12 06/06/2023 0809   ALT 10 06/06/2023 0809   ALKPHOS 194 (H) 06/06/2023 0809   BILITOT 0.3 06/06/2023 0809   EGFR 97 06/06/2023 0809   GFRNONAA >60 03/18/2023 1005    GFRNONAA 97 07/09/2020 0947     Diabetic Labs (most recent): Lab Results  Component Value Date   HGBA1C 5.9 05/18/2021   HGBA1C 5.8 (A) 11/20/2020   HGBA1C 5.5 12/10/2017     Lipid Panel ( most recent) Lipid Panel     Component Value Date/Time   CHOL 226 (H) 02/15/2013 0926   TRIG 189 (H) 02/15/2013 0926   TRIG 86 06/14/2003 0000   HDL 34 (L) 02/15/2013 0926   CHOLHDL 6.6 02/15/2013 0926   VLDL 38 02/15/2013 0926   LDLCALC 154 (H) 02/15/2013 0926     Recent Results (from the past 2160 hours)  Comprehensive metabolic panel with GFR     Status: Abnormal   Collection Time: 06/06/23  8:09 AM  Result Value Ref Range   Glucose 99 70 - 99 mg/dL   BUN 5 (L) 8 - 27 mg/dL   Creatinine, Ser 1.61 0.57 - 1.00 mg/dL   eGFR 97 >09 UE/AVW/0.98   BUN/Creatinine Ratio 7 (L) 12 - 28   Sodium 140 134 - 144 mmol/L   Potassium 4.2 3.5 - 5.2 mmol/L   Chloride 101 96 - 106 mmol/L   CO2 24 20 - 29 mmol/L   Calcium  9.5 8.7 - 10.3 mg/dL   Total Protein 7.4 6.0 - 8.5 g/dL   Albumin 4.1 3.8 - 4.9 g/dL   Globulin, Total 3.3 1.5 - 4.5 g/dL   Bilirubin Total 0.3 0.0 - 1.2 mg/dL   Alkaline Phosphatase 194 (H) 44 - 121 IU/L   AST 12 0 - 40 IU/L   ALT 10 0 - 32 IU/L  VITAMIN D  25 Hydroxy (Vit-D Deficiency, Fractures)     Status: Abnormal   Collection Time: 06/06/23  8:09 AM  Result Value Ref Range   Vit D, 25-Hydroxy 8.5 (L) 30.0 - 100.0 ng/mL    Comment: Vitamin D  deficiency has been defined by the Institute of Medicine and an Endocrine Society practice guideline as a level of serum 25-OH vitamin D  less than 20 ng/mL (1,2). The Endocrine Society went on to further define vitamin D  insufficiency as a level between 21 and 29 ng/mL (2). 1. IOM (Institute of Medicine). 2010. Dietary reference    intakes for calcium  and D. [place]: The    [employer]. 2. Holick MF, [place], Bischoff-Ferrari HA, et al.    Evaluation, treatment, and prevention of vitamin D     deficiency: an  Endocrine Society clinical practice    guideline. JCEM. 2011 Jul; 96(7):1911-30.      Lab Results  Component Value Date   TSH 0.531 09/13/2022   TSH 0.83 07/09/2020   TSH 0.744 04/14/2016   TSH 0.572 02/15/2013   TSH 0.465 10/09/2008   FREET4 1.13 09/13/2022  December 11, 2017 abdominal ultrasound IMPRESSION: Mildly heterogeneous fatty parenchyma which could reflect areas of mild geographic fatty infiltration.  No discrete hepatic mass or nodularity seen.   Post  cholecystectomy without biliary dilatation.  Assessment & Plan:   1. Elevated alkaline phosphatase level   2.  Vitamin D  deficiency  3.  Hypophosphatemia  4.  Hypomagnesemia  - [name]  is being seen at a kind request of Zheng, Jacky, MD. - I have reviewed her new and available  records and clinically evaluated the patient. - Based on these reviews, she has persistently elevated alkaline phosphatase of likely multifactorial etiology, currently at 194.   -She did have workup to rule out GI causes.  She did have normal GGT.  Her presentation is significant for profound vitamin D  deficiency with high risk of osteomalacia, historical hypomagnesemia and hypophosphatemia. Her bone scan is not indicative of lytic lesions nor evidence of Paget's disease. Her DEXA scan is showing osteopenia with FRAX showing major osteoporotic fracture risk of 7.7% and hip fracture risk of 0.4% over the next 10 years.  She could not discontinue her diclofenac  even for observation. She will be aggressively replaced with vitamin D  utilizing both vitamin D2 and D3. This potentially explains the elevated alk phos and related electrolyte abnormalities.  I discussed and prescribed vitamin D2 50,000 units weekly along with vitamin D3 5000 units daily.   -Alkaline phosphatase elevation is also infrequently observed with fatty liver disease which the patient has.  She is encouraged to continue her atorvastatin  40 mg p.o. daily.  Whole Food ,  plant-based diet was discussed and recommended to her.  She is not given magnesium  and phosphate supplements at this time.  She will have repeat labs involving magnesium , phosphorus, vitamin D , CMP, PTH before her next visit.  - she is advised to maintain close follow up with Albin Huh, MD for primary care needs.   I spent  26  minutes in the care of the patient today including review of labs from Thyroid  Function, CMP, and other relevant labs ; imaging/biopsy records (current and previous including abstractions from other facilities); face-to-face time discussing  her lab results and symptoms, medications doses, her options of short and long term treatment based on the latest standards of care / guidelines;   and documenting the encounter.  [name]  participated in the discussions, expressed understanding, and voiced agreement with the above plans.  All questions were answered to her satisfaction. she is encouraged to contact clinic should she have any questions or concerns prior to her return visit.   Follow up plan: Return in about 9 weeks (around 08/31/2023) for Fasting Labs  in AM B4 8.   Kalvin Orf, MD [facility] [address] [place], [place] [address] Phone: [phone]  Fax: [phone]     06/29/2023, 8:16 AM  This note was partially dictated with voice recognition software. Similar sounding words can be transcribed inadequately or may not  be corrected upon review.

## 2023-07-19 ENCOUNTER — Other Ambulatory Visit: Payer: Self-pay | Admitting: Gastroenterology

## 2023-07-19 DIAGNOSIS — K219 Gastro-esophageal reflux disease without esophagitis: Secondary | ICD-10-CM

## 2023-08-01 DIAGNOSIS — R748 Abnormal levels of other serum enzymes: Secondary | ICD-10-CM | POA: Diagnosis not present

## 2023-08-01 DIAGNOSIS — E559 Vitamin D deficiency, unspecified: Secondary | ICD-10-CM | POA: Diagnosis not present

## 2023-08-03 LAB — COMPREHENSIVE METABOLIC PANEL WITH GFR
ALT: 13 IU/L (ref 0–32)
AST: 12 IU/L (ref 0–40)
Albumin: 4.3 g/dL (ref 3.8–4.9)
Alkaline Phosphatase: 191 IU/L — ABNORMAL HIGH (ref 44–121)
BUN/Creatinine Ratio: 7 — ABNORMAL LOW (ref 12–28)
BUN: 6 mg/dL — ABNORMAL LOW (ref 8–27)
Bilirubin Total: 0.3 mg/dL (ref 0.0–1.2)
CO2: 26 mmol/L (ref 20–29)
Calcium: 9.5 mg/dL (ref 8.7–10.3)
Chloride: 100 mmol/L (ref 96–106)
Creatinine, Ser: 0.82 mg/dL (ref 0.57–1.00)
Globulin, Total: 3.2 g/dL (ref 1.5–4.5)
Glucose: 90 mg/dL (ref 70–99)
Potassium: 4.8 mmol/L (ref 3.5–5.2)
Sodium: 141 mmol/L (ref 134–144)
Total Protein: 7.5 g/dL (ref 6.0–8.5)
eGFR: 82 mL/min/1.73 (ref >59)

## 2023-08-03 LAB — PTH, INTACT AND CALCIUM: PTH: 27 pg/mL (ref 15–65)

## 2023-08-03 LAB — MAGNESIUM: Magnesium: 2.2 mg/dL (ref 1.6–2.3)

## 2023-08-03 LAB — LIPID PANEL
Chol/HDL Ratio: 3.8 ratio (ref 0.0–4.4)
Cholesterol, Total: 169 mg/dL (ref 100–199)
HDL: 45 mg/dL (ref >39)
LDL Chol Calc (NIH): 106 mg/dL — ABNORMAL HIGH (ref 0–99)
Triglycerides: 97 mg/dL (ref 0–149)
VLDL Cholesterol Cal: 18 mg/dL (ref 5–40)

## 2023-08-03 LAB — ALKALINE PHOSPHATASE, BONE SPECIFIC: Tandem-R Ostase: 26.9 ug/L

## 2023-08-03 LAB — VITAMIN D 25 HYDROXY (VIT D DEFICIENCY, FRACTURES): Vit D, 25-Hydroxy: 60.3 ng/mL (ref 30.0–100.0)

## 2023-08-03 LAB — PHOSPHORUS: Phosphorus: 3.6 mg/dL (ref 3.0–4.3)

## 2023-08-15 ENCOUNTER — Other Ambulatory Visit: Payer: Self-pay | Admitting: Family Medicine

## 2023-08-15 DIAGNOSIS — M48062 Spinal stenosis, lumbar region with neurogenic claudication: Secondary | ICD-10-CM

## 2023-08-23 ENCOUNTER — Encounter: Admitting: Internal Medicine

## 2023-08-23 ENCOUNTER — Other Ambulatory Visit: Payer: Self-pay | Admitting: Family Medicine

## 2023-08-23 ENCOUNTER — Telehealth: Payer: Self-pay | Admitting: Internal Medicine

## 2023-08-23 DIAGNOSIS — A609 Anogenital herpesviral infection, unspecified: Secondary | ICD-10-CM

## 2023-08-23 NOTE — Progress Notes (Deleted)
 Office Visit Note  Patient: [name]             Date of Birth: 01/22/1960           MRN: [id number]             PCP: Elicia Hamlet, MD Referring: Ezzard Sonny GORMAN DEVONNA Visit Date: 08/23/2023 Occupation: @GUAROCC @  Subjective:  No chief complaint on file.   History of Present Illness: [name] is a 63 y.o. female ***     Activities of Daily Living:  Patient reports morning stiffness for *** {minute/hour:19697}.   Patient {ACTIONS;DENIES/REPORTS:21021675::Denies} nocturnal pain.  Difficulty dressing/grooming: {ACTIONS;DENIES/REPORTS:21021675::Denies} Difficulty climbing stairs: {ACTIONS;DENIES/REPORTS:21021675::Denies} Difficulty getting out of chair: {ACTIONS;DENIES/REPORTS:21021675::Denies} Difficulty using hands for taps, buttons, cutlery, and/or writing: {ACTIONS;DENIES/REPORTS:21021675::Denies}  No Rheumatology ROS completed.   PMFS History:  Patient Active Problem List   Diagnosis Date Noted   Vitamin D  deficiency 06/29/2023   Hypomagnesemia 06/29/2023   At high risk for fracture 04/25/2023   Abdominal pain 08/24/2022   GERD (gastroesophageal reflux disease) 08/24/2022   Lower abdominal pain 08/24/2022   Loss of weight 08/24/2022   Cheilosis 05/21/2021   Burning sensation of toe and foot 05/21/2021   Abdominal pain, epigastric 02/24/2021   Lichen planus atrophicus 02/09/2021   Acute conjunctivitis of both eyes 03/25/2020   Radial tunnel syndrome 02/06/2019   Sacroiliac inflammation (HCC) 05/23/2018   Poor sleep hygiene 05/23/2018   Chronic obstructive pulmonary disease with (acute) exacerbation (HCC) 05/23/2018   Orthostatic hypotension 05/23/2018   Syncope and collapse 05/23/2018   Elevated fasting glucose 05/23/2018   Nocturia more than twice per night 05/23/2018   Snoring 05/23/2018   Hemorrhoid 02/15/2018   Abdominal distention 02/15/2018   Anemia 01/09/2018   Pain of upper abdomen 01/09/2018   Healthcare maintenance 12/30/2017    Antibiotic-associated diarrhea 12/22/2017   Vagina itching 12/22/2017   Sepsis (HCC)    Respiratory distress    Partial small bowel obstruction (HCC) 12/11/2017   COPD with acute exacerbation (HCC) 12/11/2017   Hypophosphatemia 12/11/2017   Sepsis due to undetermined organism (HCC) 12/10/2017   AKI (acute kidney injury) (HCC) 12/10/2017   Hyperbilirubinemia 12/10/2017   Aortic regurgitation 10/25/2017   Diastolic dysfunction 10/25/2017   Dysphagia 01/20/2017   Left hip pain 01/19/2017   Right hand pain 01/19/2017   Diarrhea 10/11/2016   Right hip pain 06/07/2016   Dysuria 04/14/2016   Malaise and fatigue 04/14/2016   Breast pain, left 11/06/2015   Mass of left forearm 10/10/2015    Class: Chronic   Dysphagia, oropharyngeal    Gastroesophageal reflux disease without esophagitis    Carpal tunnel syndrome, right 07/11/2015    Class: Chronic   Prediabetes 07/09/2015   Lumbar stenosis with neurogenic claudication 01/05/2015   Spondylolisthesis of lumbar region 01/03/2015    Class: Chronic   Soft tissue mass 07/23/2014    Class: Acute   Coccygodynia 07/23/2014    Class: Acute   Constipation 02/15/2014   Dysphagia, pharyngoesophageal phase    Elevated alkaline phosphatase level 11/06/2013   Rectal bleeding 11/06/2013   Obesity, unspecified 02/15/2013   Genital herpes 10/12/2012   HSV (herpes simplex virus) anogenital infection 09/29/2012   UNSPECIFIED SLEEP APNEA 12/04/2008   ADENOCARCINOMA, LUNG 10/09/2008   Osteoarthritis of multiple joints 07/13/2006   HYPERCHOLESTEROLEMIA 03/17/2006   Major depressive disorder, recurrent episode (HCC) 03/17/2006   HYPERTENSION, BENIGN SYSTEMIC 03/17/2006   GASTROESOPHAGEAL REFLUX, NO ESOPHAGITIS 03/17/2006   Insomnia due to medical condition 03/17/2006  Past Medical History:  Diagnosis Date   Anxiety    Aortic insufficiency    a. 10/2017 Echo: Mod AI; b. 04/2020 Echo: Mod AI.   Asthma    every now and then.  Wheezing and coughing    Cancer (HCC) 1997   left lung   Chronic abdominal pain    Chronic back pain    Chronic joint pain    Depression    Diastolic dysfunction    a. 10/2017 Echo: EF 60-65%, GrII DD, mod AI; b. 04/2020 Echo: EF 60-65%, no rwma, GrI DD, nl RV fxn, mod AI.   Essential hypertension    GERD (gastroesophageal reflux disease)    HLD (hyperlipidemia)    Palpitations 01/2013   a. 11/2017 Holter: Occas PACs, rare PVCs. Rare nonsustained atrial tachycardia; b. 01/2018 Event monitor: Predominantly sinus rhythm @ 90 (64-135). No significant arrhythmias.   Shortness of breath dyspnea    with exertion    Family History  Problem Relation Age of Onset   Diabetes Other    Heart disease Other        has pace maker   Hypertension Mother    Kidney disease Mother    Hypertension Father    Kidney disease Father    Heart disease Father    Cancer Father        leukemia   Hypertension Sister    Hypertension Sister    Colon cancer Neg Hx    Past Surgical History:  Procedure Laterality Date   ABDOMINAL HYSTERECTOMY     ABDOMINAL SURGERY     ovarian cyst removal   BALLOON DILATION N/A 02/27/2021   Procedure: BALLOON DILATION;  Surgeon: Cindie Carlin POUR, DO;  [facility]: [facility];  Service: Endoscopy;  Laterality: N/A;   BIOPSY N/A 11/22/2013   Procedure: GASTRIC BIOPSY;  Surgeon: Lamar CHRISTELLA Hollingshead, MD;  Location: [facility];  Service: Endoscopy;  Laterality: N/A;   BIOPSY  02/27/2021   Procedure: BIOPSY;  Surgeon: Cindie Carlin POUR, DO;  [facility]: [facility];  Service: Endoscopy;;   BIOPSY  05/12/2022   Procedure: BIOPSY;  Surgeon: Hollingshead Lamar CHRISTELLA, MD;  [facility]: [facility];  Service: Endoscopy;;   BLADDER SURGERY  suspension x4   x 4   CARPAL TUNNEL RELEASE Right 07/11/2015   Procedure: RIGHT CARPAL TUNNEL RELEASE;  Surgeon: Lynwood FORBES Better, MD;  Location: [facility];  Service: Orthopedics;  Laterality: Right;   CARPAL TUNNEL RELEASE Left 05/19/2022   Procedure: CARPAL TUNNEL RELEASE;   Surgeon: Romona Harari, MD;  Location: [facility];  Service: Orthopedics;  Laterality: Left;  or MAC with regional 60   CHOLECYSTECTOMY N/A 04/05/2014   Procedure: LAPAROSCOPIC CHOLECYSTECTOMY;  Surgeon: Oneil Budge Md, MD;  Location: [facility];  Service: General;  Laterality: N/A;   COCCYGECTOMY N/A 04/11/2015   Procedure: COCCYGECTOMY WITH OSTEOTOMY THROUGH AREA OF DEFORMITY;  Surgeon: Lynwood FORBES Better, MD;  Location: [facility];  Service: Orthopedics;  Laterality: N/A;   COLONOSCOPY     COLONOSCOPY WITH PROPOFOL  N/A 11/22/2013   Dr. Hollingshead: Grade 3 and 4/internal hemorrhoids?"likely source of hematochezia Normal colonoscopy otherwise.    COLONOSCOPY WITH PROPOFOL  N/A 09/15/2017   Dr. Thersia: Hemorrhoids, next colonoscopy 10 years   ESOPHAGOGASTRODUODENOSCOPY (EGD) WITH PROPOFOL  N/A 11/22/2013   Dr. Hollingshead: Incomplete Schatzki's ring dilated and disrupted as described above.  Small hiatal hernia. Focally abnormal gastric mucosa of uncertain significance status post biopsy, reactive gastropathy, negative H.pylori   ESOPHAGOGASTRODUODENOSCOPY (EGD) WITH  PROPOFOL  N/A 08/04/2015   Dr. Shaaron: mild Schatzki's ring s/p dilation, small hiatal hernia    ESOPHAGOGASTRODUODENOSCOPY (EGD) WITH PROPOFOL  N/A 03/17/2017   Mild Schatki's ring s/p dilation, small hiatal hernia, otherwise normal   ESOPHAGOGASTRODUODENOSCOPY (EGD) WITH PROPOFOL  N/A 09/18/2018   Dr. Shaaron: Schatzki ring status post dilation and disruption.  Hiatal hernia.   ESOPHAGOGASTRODUODENOSCOPY (EGD) WITH PROPOFOL  N/A 12/20/2019   non-obstructing Schatzki's ring s/p dilation with 56, 58, and 60 F.   ESOPHAGOGASTRODUODENOSCOPY (EGD) WITH PROPOFOL  N/A 02/27/2021   Procedure: ESOPHAGOGASTRODUODENOSCOPY (EGD) WITH PROPOFOL ;  Surgeon: Cindie Carlin POUR, DO;  [facility];  Service: Endoscopy;  Laterality: N/A;  11:30am   ESOPHAGOGASTRODUODENOSCOPY (EGD) WITH PROPOFOL  N/A 05/12/2022   Procedure: ESOPHAGOGASTRODUODENOSCOPY  (EGD) WITH PROPOFOL ;  Surgeon: Shaaron Lamar HERO, MD;  [facility];  Service: Endoscopy;  Laterality: N/A;  8:30 am   ESOPHAGOGASTRODUODENOSCOPY (EGD) WITH PROPOFOL  N/A 03/31/2023   Procedure: ESOPHAGOGASTRODUODENOSCOPY (EGD) WITH PROPOFOL ;  Surgeon: Shaaron Lamar HERO, MD;  [facility];  Service: Endoscopy;  Laterality: N/A;  900am, asa 1/2   EXCISION VAGINAL CYST Left 06/20/2012   Procedure: EXCISION LEFT LABIAL CYST;  Surgeon: Norleen LULLA Server, MD;  Location: [facility];  Service: Gynecology;  Laterality: Left;   GANGLION CYST EXCISION Left 05/19/2022   Procedure: left volar carpal ganglion cyst excision;  Surgeon: Romona Harari, MD;  Location: [facility];  Service: Orthopedics;  Laterality: Left;  or MAC with regional 60   LOBECTOMY Left    LUNG CANCER SURGERY  1997   age 76, resection only   MALONEY DILATION N/A 11/22/2013   Procedure: MALONEY DILATION;  Surgeon: Lamar HERO Shaaron, MD;  Location: [facility];  Service: Endoscopy;  Laterality: N/A;  56   MALONEY DILATION N/A 08/04/2015   Procedure: AGAPITO DILATION;  Surgeon: Lamar HERO Shaaron, MD;  [facility];  Service: Endoscopy;  Laterality: N/A;   MALONEY DILATION N/A 03/17/2017   Procedure: AGAPITO DILATION;  Surgeon: Shaaron Lamar HERO, MD;  [facility];  Service: Endoscopy;  Laterality: N/A;   MALONEY DILATION N/A 09/18/2018   Procedure: AGAPITO DILATION;  Surgeon: Shaaron Lamar HERO, MD;  [facility];  Service: Endoscopy;  Laterality: N/A;   MALONEY DILATION N/A 12/20/2019   Procedure: AGAPITO DILATION;  Surgeon: Shaaron Lamar HERO, MD;  [facility];  Service: Endoscopy;  Laterality: N/A;   MALONEY DILATION N/A 05/12/2022   Procedure: AGAPITO DILATION;  Surgeon: Shaaron Lamar HERO, MD;  [facility];  Service: Endoscopy;  Laterality: N/A;   MALONEY DILATION N/A 03/31/2023   Procedure: DILATION, ESOPHAGUS, USING MALONEY DILATOR;  Surgeon: Shaaron Lamar HERO, MD;   [facility];  Service: Endoscopy;  Laterality: N/A;   MASS EXCISION N/A 07/23/2014   Procedure: EXCISIONAL BIOPSY NODULE LEFT PARACOCCYGEAL AREA;  Surgeon: Lynwood FORBES Better, MD;  Location: [facility];  Service: Orthopedics;  Laterality: N/A;   MASS EXCISION Left 10/10/2015   Procedure: EXCISIONAL BIOPSY OF LEFT DORSORADIAL FOREARM MASS LIPOMA;  Surgeon: Lynwood FORBES Better, MD;  Location: [facility];  Service: Orthopedics;  Laterality: Left;   TUBAL LIGATION     Social History   Social History Narrative   Not on file   Immunization History  Administered Date(s) Administered   Influenza Whole 10/24/2007   Td 09/19/2002     Objective: Vital Signs: There were no vitals taken for this visit.   Physical Exam   Musculoskeletal Exam: ***  CDAI Exam: CDAI Score: -- Patient Global: --; Provider Global: -- Swollen: --; Tender: -- Joint Exam 08/23/2023   No joint exam has been documented for this visit   There is currently no information documented on the homunculus. Go to the Rheumatology activity and complete the homunculus joint exam.  Investigation: No additional findings.  Imaging: No results found.  Recent Labs: Lab Results  Component Value Date   WBC 4.8 03/18/2023   HGB 14.3 03/18/2023   PLT 289 03/18/2023   NA 141 08/01/2023   K 4.8 08/01/2023   CL 100 08/01/2023   CO2 26 08/01/2023   GLUCOSE 90 08/01/2023   BUN 6 (L) 08/01/2023   CREATININE 0.82 08/01/2023   BILITOT 0.3 08/01/2023   ALKPHOS 191 (H) 08/01/2023   AST 12 08/01/2023   ALT 13 08/01/2023   PROT 7.5 08/01/2023   ALBUMIN 4.3 08/01/2023   CALCIUM  9.5 08/01/2023   GFRAA 113 07/09/2020    Speciality Comments: No specialty comments available.  Procedures:  No procedures performed Allergies: Promethazine  hcl   Assessment / Plan:     Visit Diagnoses: No diagnosis found.  Orders: No orders of the defined types were placed in this encounter.  No orders of the defined types were  placed in this encounter.   Face-to-face time spent with patient was *** minutes. Greater than 50% of time was spent in counseling and coordination of care.  Follow-Up Instructions: No follow-ups on file.   Lonni LELON Ester, MD  Note - This record has been created using [employer].  Chart creation errors have been sought, but may not always  have been located. Such creation errors do not reflect on  the standard of medical care.

## 2023-08-23 NOTE — Telephone Encounter (Signed)
 Pt stated she woke up with car troubles and would like to reschedule. I stated that we do not reschedule NPT appts and would have to ask the doctor for approval. Pt understood.

## 2023-08-26 ENCOUNTER — Telehealth: Payer: Self-pay

## 2023-08-26 ENCOUNTER — Ambulatory Visit: Admitting: Internal Medicine

## 2023-08-26 NOTE — Telephone Encounter (Signed)
 Pt came in to the office stating that she has been having diarrhea and abd cramping for awhile and requested to speak to a nurse. I asked the pt if she was still taking the Linzess  290 mcg and she stated that she was. I instructed the pt to stop taking the Linzess  and to call me on Monday and let me know how she is doing. I informed her that the Linzess  was for constipation and that if she was having loose stools that she was not to take the medication. Pt verbalized understanding and stated that she would call me Monday with an update.

## 2023-08-30 ENCOUNTER — Telehealth: Payer: Self-pay

## 2023-08-30 NOTE — Telephone Encounter (Signed)
 Pt called stating that she is still having diarrhea despite stopping Linzess . Pt states that she is going aprox. 3 times a day and that it is watery.

## 2023-08-31 ENCOUNTER — Ambulatory Visit: Admitting: "Endocrinology

## 2023-08-31 ENCOUNTER — Encounter: Payer: Self-pay | Admitting: "Endocrinology

## 2023-08-31 VITALS — BP 134/56 | HR 84 | Ht 64.0 in | Wt 154.2 lb

## 2023-08-31 DIAGNOSIS — E782 Mixed hyperlipidemia: Secondary | ICD-10-CM | POA: Diagnosis not present

## 2023-08-31 DIAGNOSIS — R748 Abnormal levels of other serum enzymes: Secondary | ICD-10-CM | POA: Diagnosis not present

## 2023-08-31 DIAGNOSIS — Z9189 Other specified personal risk factors, not elsewhere classified: Secondary | ICD-10-CM | POA: Diagnosis not present

## 2023-08-31 DIAGNOSIS — E559 Vitamin D deficiency, unspecified: Secondary | ICD-10-CM

## 2023-08-31 NOTE — Progress Notes (Signed)
 08/31/2023, 9:40 AM   Endocrinology follow-up note  Subjective:    Patient ID: [name], female    DOB: 01-31-1960, PCP Elicia Hamlet, MD   Past Medical History:  Diagnosis Date   Anxiety    Aortic insufficiency    a. 10/2017 Echo: Mod AI; b. 04/2020 Echo: Mod AI.   Asthma    every now and then.  Wheezing and coughing   Cancer (HCC) 1997   left lung   Chronic abdominal pain    Chronic back pain    Chronic joint pain    Depression    Diastolic dysfunction    a. 10/2017 Echo: EF 60-65%, GrII DD, mod AI; b. 04/2020 Echo: EF 60-65%, no rwma, GrI DD, nl RV fxn, mod AI.   Essential hypertension    GERD (gastroesophageal reflux disease)    HLD (hyperlipidemia)    Palpitations 01/2013   a. 11/2017 Holter: Occas PACs, rare PVCs. Rare nonsustained atrial tachycardia; b. 01/2018 Event monitor: Predominantly sinus rhythm @ 90 (64-135). No significant arrhythmias.   Shortness of breath dyspnea    with exertion   Past Surgical History:  Procedure Laterality Date   ABDOMINAL HYSTERECTOMY     ABDOMINAL SURGERY     ovarian cyst removal   BALLOON DILATION N/A 02/27/2021   Procedure: BALLOON DILATION;  Surgeon: Cindie Carlin POUR, DO;  [facility];  Service: Endoscopy;  Laterality: N/A;   BIOPSY N/A 11/22/2013   Procedure: GASTRIC BIOPSY;  Surgeon: Lamar CHRISTELLA Hollingshead, MD;  Location: [facility];  Service: Endoscopy;  Laterality: N/A;   BIOPSY  02/27/2021   Procedure: BIOPSY;  Surgeon: Cindie Carlin POUR, DO;  [facility];  Service: Endoscopy;;   BIOPSY  05/12/2022   Procedure: BIOPSY;  Surgeon: Hollingshead Lamar CHRISTELLA, MD;  [facility];  Service: Endoscopy;;   BLADDER SURGERY  suspension x4   x 4   CARPAL TUNNEL RELEASE Right 07/11/2015   Procedure: RIGHT CARPAL TUNNEL RELEASE;  Surgeon: Lynwood FORBES Better, MD;  Location: [facility];  Service: Orthopedics;  Laterality: Right;   CARPAL TUNNEL RELEASE Left  05/19/2022   Procedure: CARPAL TUNNEL RELEASE;  Surgeon: Romona Harari, MD;  Location: [facility];  Service: Orthopedics;  Laterality: Left;  or MAC with regional 60   CHOLECYSTECTOMY N/A 04/05/2014   Procedure: LAPAROSCOPIC CHOLECYSTECTOMY;  Surgeon: Oneil Budge Md, MD;  Location: [facility];  Service: General;  Laterality: N/A;   COCCYGECTOMY N/A 04/11/2015   Procedure: COCCYGECTOMY WITH OSTEOTOMY THROUGH AREA OF DEFORMITY;  Surgeon: Lynwood FORBES Better, MD;  [facility];  Service: Orthopedics;  Laterality: N/A;   COLONOSCOPY     COLONOSCOPY WITH PROPOFOL  N/A 11/22/2013   Dr. Hollingshead: Grade 3 and 4/internal hemorrhoids?"likely source of hematochezia Normal colonoscopy otherwise.    COLONOSCOPY WITH PROPOFOL  N/A 09/15/2017   Dr. Thersia: Hemorrhoids, next colonoscopy 10 years   ESOPHAGOGASTRODUODENOSCOPY (EGD) WITH PROPOFOL  N/A 11/22/2013   Dr. Hollingshead: Incomplete Schatzki's ring dilated and disrupted as described above.  Small hiatal hernia. Focally abnormal gastric mucosa of uncertain significance status post biopsy, reactive gastropathy, negative H.pylori   ESOPHAGOGASTRODUODENOSCOPY (EGD) WITH PROPOFOL  N/A 08/04/2015   Dr. Hollingshead: mild Schatzki's ring  s/p dilation, small hiatal hernia    ESOPHAGOGASTRODUODENOSCOPY (EGD) WITH PROPOFOL  N/A 03/17/2017   Mild Schatki's ring s/p dilation, small hiatal hernia, otherwise normal   ESOPHAGOGASTRODUODENOSCOPY (EGD) WITH PROPOFOL  N/A 09/18/2018   Dr. Shaaron: Schatzki ring status post dilation and disruption.  Hiatal hernia.   ESOPHAGOGASTRODUODENOSCOPY (EGD) WITH PROPOFOL  N/A 12/20/2019   non-obstructing Schatzki's ring s/p dilation with 56, 58, and 60 F.   ESOPHAGOGASTRODUODENOSCOPY (EGD) WITH PROPOFOL  N/A 02/27/2021   Procedure: ESOPHAGOGASTRODUODENOSCOPY (EGD) WITH PROPOFOL ;  Surgeon: Cindie Carlin POUR, DO;  [facility];  Service: Endoscopy;  Laterality: N/A;  11:30am   ESOPHAGOGASTRODUODENOSCOPY (EGD) WITH PROPOFOL  N/A  05/12/2022   Procedure: ESOPHAGOGASTRODUODENOSCOPY (EGD) WITH PROPOFOL ;  Surgeon: Shaaron Lamar HERO, MD;  [facility];  Service: Endoscopy;  Laterality: N/A;  8:30 am   ESOPHAGOGASTRODUODENOSCOPY (EGD) WITH PROPOFOL  N/A 03/31/2023   Procedure: ESOPHAGOGASTRODUODENOSCOPY (EGD) WITH PROPOFOL ;  Surgeon: Shaaron Lamar HERO, MD;  [facility];  Service: Endoscopy;  Laterality: N/A;  900am, asa 1/2   EXCISION VAGINAL CYST Left 06/20/2012   Procedure: EXCISION LEFT LABIAL CYST;  Surgeon: Norleen LULLA Server, MD;  Location: [facility];  Service: Gynecology;  Laterality: Left;   GANGLION CYST EXCISION Left 05/19/2022   Procedure: left volar carpal ganglion cyst excision;  Surgeon: Romona Harari, MD;  Location: [facility];  Service: Orthopedics;  Laterality: Left;  or MAC with regional 60   LOBECTOMY Left    LUNG CANCER SURGERY  1997   age 63, resection only   MALONEY DILATION N/A 11/22/2013   Procedure: MALONEY DILATION;  Surgeon: Lamar HERO Shaaron, MD;  [facility];  Service: Endoscopy;  Laterality: N/A;  56   MALONEY DILATION N/A 08/04/2015   Procedure: AGAPITO DILATION;  Surgeon: Lamar HERO Shaaron, MD;  [facility];  Service: Endoscopy;  Laterality: N/A;   MALONEY DILATION N/A 03/17/2017   Procedure: AGAPITO DILATION;  Surgeon: Shaaron Lamar HERO, MD;  [facility];  Service: Endoscopy;  Laterality: N/A;   MALONEY DILATION N/A 09/18/2018   Procedure: AGAPITO DILATION;  Surgeon: Shaaron Lamar HERO, MD;  [facility];  Service: Endoscopy;  Laterality: N/A;   MALONEY DILATION N/A 12/20/2019   Procedure: AGAPITO DILATION;  Surgeon: Shaaron Lamar HERO, MD;  [facility];  Service: Endoscopy;  Laterality: N/A;   MALONEY DILATION N/A 05/12/2022   Procedure: AGAPITO DILATION;  Surgeon: Shaaron Lamar HERO, MD;  [facility];  Service: Endoscopy;  Laterality: N/A;   MALONEY DILATION N/A 03/31/2023   Procedure: DILATION, ESOPHAGUS, USING  MALONEY DILATOR;  Surgeon: Shaaron Lamar HERO, MD;  [facility];  Service: Endoscopy;  Laterality: N/A;   MASS EXCISION N/A 07/23/2014   Procedure: EXCISIONAL BIOPSY NODULE LEFT PARACOCCYGEAL AREA;  Surgeon: Lynwood FORBES Better, MD;  [facility];  Service: Orthopedics;  Laterality: N/A;   MASS EXCISION Left 10/10/2015   Procedure: EXCISIONAL BIOPSY OF LEFT DORSORADIAL FOREARM MASS LIPOMA;  Surgeon: Lynwood FORBES Better, MD;  Location: [facility];  Service: Orthopedics;  Laterality: Left;   TUBAL LIGATION     Social History   Socioeconomic History   Marital status: Married    Spouse name: Carlo   Number of children: 3   Years of education: Not on file   Highest education level: Not on file  Occupational History   Occupation: Disability  Tobacco Use   Smoking status: Former    Current packs/day: 0.00  Average packs/day: 1.5 packs/day for 25.0 years (37.5 ttl pk-yrs)    Types: Cigarettes    Start date: 04/07/1970    Quit date: 04/07/1995    Years since quitting: 28.4   Smokeless tobacco: Never  Vaping Use   Vaping status: Never Used  Substance and Sexual Activity   Alcohol use: No   Drug use: No   Sexual activity: Not Currently    Birth control/protection: Surgical    Comment: hyst  Other Topics Concern   Not on file  Social History Narrative   Not on file   Social Drivers of Health   Financial Resource Strain: Low Risk  (05/12/2023)   Overall Financial Resource Strain (CARDIA)    Difficulty of Paying Living Expenses: Not hard at all  Food Insecurity: No Food Insecurity (05/12/2023)   Hunger Vital Sign    Worried About Running Out of Food in the Last Year: Never true    Ran Out of Food in the Last Year: Never true  Transportation Needs: No Transportation Needs (05/12/2023)   PRAPARE - Administrator, Civil Service (Medical): No    Lack of Transportation (Non-Medical): No  Physical Activity: Sufficiently Active (05/12/2023)   Exercise Vital Sign     Days of Exercise per Week: 5 days    Minutes of Exercise per Session: 30 min  Stress: No Stress Concern Present (05/12/2023)   [employer] of Occupational Health - Occupational Stress Questionnaire    Feeling of Stress : Only a little  Social Connections: Moderately Isolated (05/12/2023)   Social Connection and Isolation Panel    Frequency of Communication with Friends and Family: More than three times a week    Frequency of Social Gatherings with Friends and Family: Three times a week    Attends Religious Services: Never    Active Member of Clubs or Organizations: No    Attends Engineer, structural: Never    Marital Status: Married   Family History  Problem Relation Age of Onset   Diabetes Other    Heart disease Other        has pace maker   Hypertension Mother    Kidney disease Mother    Hypertension Father    Kidney disease Father    Heart disease Father    Cancer Father        leukemia   Hypertension Sister    Hypertension Sister    Colon cancer Neg Hx    Outpatient Encounter Medications as of 08/31/2023  Medication Sig   acyclovir  (ZOVIRAX ) 400 MG tablet Take 1 tablet by mouth twice daily   amitriptyline  (ELAVIL ) 150 MG tablet Take 1 tablet by mouth once daily   amLODipine  (NORVASC ) 10 MG tablet Take 1 tablet (10 mg total) by mouth daily.   atorvastatin  (LIPITOR) 40 MG tablet Take 1 tablet (40 mg total) by mouth daily.   budesonide -formoterol  (SYMBICORT ) 160-4.5 MCG/ACT inhaler Inhale 2 puffs into the lungs 2 (two) times daily.   celecoxib  (CELEBREX ) 200 MG capsule Take 1 capsule (200 mg total) by mouth daily as needed.   Cholecalciferol  (VITAMIN D3) 125 MCG (5000 UT) CAPS Take 1 capsule (5,000 Units total) by mouth daily.   DICLOFENAC  PO Take by mouth 2 (two) times daily.   DULoxetine  (CYMBALTA ) 60 MG capsule Take 1 capsule (60 mg total) by mouth 2 (two) times daily.   esomeprazole  (NEXIUM ) 40 MG capsule Take 1 capsule (40 mg total) by mouth daily before  breakfast.   furosemide  (  LASIX ) 20 MG tablet Take 1 tablet (20 mg total) by mouth daily as needed for edema.   hydrocortisone  (ANUSOL -HC) 2.5 % rectal cream Place 1 Application rectally 2 (two) times daily. Up to 14 days at a time.   linaclotide  (LINZESS ) 290 MCG CAPS capsule TAKE 1 CAPSULE BY MOUTH ONCE DAILY BEFORE BREAKFAST   pregabalin  (LYRICA ) 100 MG capsule Take 1 capsule by mouth twice daily   [DISCONTINUED] Vitamin D , Ergocalciferol , (DRISDOL ) 1.25 MG (50000 UNIT) CAPS capsule Take 1 capsule (50,000 Units total) by mouth every 7 (seven) days.   No facility-administered encounter medications on file as of 08/31/2023.   ALLERGIES: Allergies  Allergen Reactions   Promethazine  Hcl Nausea And Vomiting and Other (See Comments)    Makes my head hurt really bad too    VACCINATION STATUS: Immunization History  Administered Date(s) Administered   Influenza Whole 10/24/2007   Td 09/19/2002    HPI Eudell A Blakney is 63 y.o. female who presents today with a medical history as above. she is being seen in follow-up after she was seen consultation for elevated alkaline phosphatase requested by Elicia Hamlet, MD.  Patient has multiple medical problems including lung cancer status postsurgical treatment in the remote past.   She also has medical history of resection of benign bone tumor from her coccygeal region. She is a former smoker with COPD/asthma. For the last 2 years she did have mild to moderate elevation of alkaline phosphatase with some rare interval improvement.  She was worked up by GI pointing towards unlikely hepatic or origin.  She did have normal GGT. She does not have any prior history of Paget's disease. She is documented to have steatohepatitis, profound vitamin D  deficiency.  She is on high-dose vitamin D  since replacement with clinical response.  She is still  on polypharmacy including atorvastatin , duloxetine , amitriptyline ,  amlodipine , celecoxib  diclofenac ,  Linzess ,  esomeprazole ,  furosemide , budesonide . - She presents with previsit repeat labs showing stable, but still high alk phos at 190, hyperlipidemia.   Her labs show correction of her previously documented hypomagnesemia, hypophosphatemia.  She does not have interval acute developments.   Review of Systems  Constitutional: +mildly fluctuating body weight ,  no fatigue, no subjective hyperthermia, no subjective hypothermia  Respiratory: + cough, + wheezes, no shortness of breath   Objective:       08/31/2023    8:36 AM 06/29/2023    8:01 AM 05/12/2023    8:32 AM  Vitals with BMI  Height 5' 4 5' 4 5' 4  Weight 154 lbs 3 oz 155 lbs 3 oz 158 lbs  BMI 26.46 26.63 27.11  Systolic 134 132   Diastolic 56 56   Pulse 84 76     BP (!) 134/56   Pulse 84   Ht 5' 4 (1.626 m)   Wt 154 lb 3.2 oz (69.9 kg)   BMI 26.47 kg/m   Wt Readings from Last 3 Encounters:  08/31/23 154 lb 3.2 oz (69.9 kg)  06/29/23 155 lb 3.2 oz (70.4 kg)  05/12/23 158 lb (71.7 kg)    Physical Exam  Constitutional:  Body mass index is 26.47 kg/m.,  not in acute distress, normal state of mind Eyes: PERRLA, EOMI, no exophthalmos   CMP ( most recent) CMP     Component Value Date/Time   NA 141 08/01/2023 0829   K 4.8 08/01/2023 0829   CL 100 08/01/2023 0829   CO2 26 08/01/2023 0829   GLUCOSE 90 08/01/2023 0829  GLUCOSE 92 03/18/2023 1005   BUN 6 (L) 08/01/2023 0829   CREATININE 0.82 08/01/2023 0829   CREATININE 0.85 03/16/2021 0747   CALCIUM  9.5 08/01/2023 0829   PROT 7.5 08/01/2023 0829   ALBUMIN 4.3 08/01/2023 0829   AST 12 08/01/2023 0829   ALT 13 08/01/2023 0829   ALKPHOS 191 (H) 08/01/2023 0829   BILITOT 0.3 08/01/2023 0829   EGFR 82 08/01/2023 0829   GFRNONAA >60 03/18/2023 1005   GFRNONAA 97 07/09/2020 0947     Diabetic Labs (most recent): Lab Results  Component Value Date   HGBA1C 5.9 05/18/2021   HGBA1C 5.8 (A) 11/20/2020   HGBA1C 5.5 12/10/2017     Lipid Panel ( most  recent) Lipid Panel     Component Value Date/Time   CHOL 169 08/01/2023 0829   TRIG 97 08/01/2023 0829   TRIG 86 06/14/2003 0000   HDL 45 08/01/2023 0829   CHOLHDL 3.8 08/01/2023 0829   CHOLHDL 6.6 02/15/2013 0926   VLDL 38 02/15/2013 0926   LDLCALC 106 (H) 08/01/2023 0829   LABVLDL 18 08/01/2023 0829     Recent Results (from the past 2160 hours)  Comprehensive metabolic panel with GFR     Status: Abnormal   Collection Time: 06/06/23  8:09 AM  Result Value Ref Range   Glucose 99 70 - 99 mg/dL   BUN 5 (L) 8 - 27 mg/dL   Creatinine, Ser 9.28 0.57 - 1.00 mg/dL   eGFR 97 >40 fO/fpw/8.26   BUN/Creatinine Ratio 7 (L) 12 - 28   Sodium 140 134 - 144 mmol/L   Potassium 4.2 3.5 - 5.2 mmol/L   Chloride 101 96 - 106 mmol/L   CO2 24 20 - 29 mmol/L   Calcium  9.5 8.7 - 10.3 mg/dL   Total Protein 7.4 6.0 - 8.5 g/dL   Albumin 4.1 3.8 - 4.9 g/dL   Globulin, Total 3.3 1.5 - 4.5 g/dL   Bilirubin Total 0.3 0.0 - 1.2 mg/dL   Alkaline Phosphatase 194 (H) 44 - 121 IU/L   AST 12 0 - 40 IU/L   ALT 10 0 - 32 IU/L  VITAMIN D  25 Hydroxy (Vit-D Deficiency, Fractures)     Status: Abnormal   Collection Time: 06/06/23  8:09 AM  Result Value Ref Range   Vit D, 25-Hydroxy 8.5 (L) 30.0 - 100.0 ng/mL    Comment: Vitamin D  deficiency has been defined by the Institute of Medicine and an Endocrine Society practice guideline as a level of serum 25-OH vitamin D  less than 20 ng/mL (1,2). The Endocrine Society went on to further define vitamin D  insufficiency as a level between 21 and 29 ng/mL (2). 1. IOM (Institute of Medicine). 2010. Dietary reference    intakes for calcium  and D. [place]: The    [employer]. 2. Holick MF, Binkley [place], Bischoff-Ferrari HA, et al.    Evaluation, treatment, and prevention of vitamin D     deficiency: an Endocrine Society clinical practice    guideline. JCEM. 2011 Jul; 96(7):1911-30.   Alkaline phosphatase, bone specific     Status: None   Collection Time:  08/01/23  8:29 AM  Result Value Ref Range   Tandem-R Ostase 26.9 ug/L    Comment:          Premenopausal Women:               6.0 - 22.7          Postmenopausal Women:  8.1 - 31.6   Comprehensive metabolic panel with GFR     Status: Abnormal   Collection Time: 08/01/23  8:29 AM  Result Value Ref Range   Glucose 90 70 - 99 mg/dL   BUN 6 (L) 8 - 27 mg/dL   Creatinine, Ser 9.17 0.57 - 1.00 mg/dL   eGFR 82 >40 fO/fpw/8.26   BUN/Creatinine Ratio 7 (L) 12 - 28   Sodium 141 134 - 144 mmol/L   Potassium 4.8 3.5 - 5.2 mmol/L   Chloride 100 96 - 106 mmol/L   CO2 26 20 - 29 mmol/L   Calcium  9.5 8.7 - 10.3 mg/dL   Total Protein 7.5 6.0 - 8.5 g/dL   Albumin 4.3 3.8 - 4.9 g/dL   Globulin, Total 3.2 1.5 - 4.5 g/dL   Bilirubin Total 0.3 0.0 - 1.2 mg/dL   Alkaline Phosphatase 191 (H) 44 - 121 IU/L   AST 12 0 - 40 IU/L   ALT 13 0 - 32 IU/L  Lipid panel     Status: Abnormal   Collection Time: 08/01/23  8:29 AM  Result Value Ref Range   Cholesterol, Total 169 100 - 199 mg/dL   Triglycerides 97 0 - 149 mg/dL   HDL 45 >60 mg/dL   VLDL Cholesterol Cal 18 5 - 40 mg/dL   LDL Chol Calc (NIH) 893 (H) 0 - 99 mg/dL   Chol/HDL Ratio 3.8 0.0 - 4.4 ratio    Comment:                                   T. Chol/HDL Ratio                                             Men  Women                               1/2 Avg.Risk  3.4    3.3                                   Avg.Risk  5.0    4.4                                2X Avg.Risk  9.6    7.1                                3X Avg.Risk 23.4   11.0   VITAMIN D  25 Hydroxy (Vit-D Deficiency, Fractures)     Status: None   Collection Time: 08/01/23  8:29 AM  Result Value Ref Range   Vit D, 25-Hydroxy 60.3 30.0 - 100.0 ng/mL    Comment: Vitamin D  deficiency has been defined by the Institute of Medicine and an Endocrine Society practice guideline as a level of serum 25-OH vitamin D  less than 20 ng/mL (1,2). The Endocrine Society went on to further define  vitamin D  insufficiency as a level between 21 and 29 ng/mL (2). 1. IOM (Institute of Medicine). 2010. Dietary reference    intakes for calcium  and D. [place]: The    [employer]. 2. Holick MF, [place], Bischoff-Ferrari HA, et al.    Evaluation, treatment, and prevention of vitamin D     deficiency: an Endocrine Society clinical practice    guideline. JCEM. 2011 Jul; 96(7):1911-30.   PTH, intact and calcium      Status: None   Collection Time: 08/01/23  8:29 AM  Result Value Ref Range   PTH 27 15 - 65 pg/mL   PTH Interp Comment     Comment: Interpretation                 Intact PTH    Calcium                                  (pg/mL)      (mg/dL) Normal                          15 - 65     8.6 - 10.2 Primary Hyperparathyroidism         >65          >10.2 Secondary Hyperparathyroidism       >65          <10.2 Non-Parathyroid Hypercalcemia       <65          >10.2 Hypoparathyroidism                  <15          < 8.6 Non-Parathyroid Hypocalcemia    15 - 65          < 8.6   Magnesium      Status: None   Collection Time: 08/01/23  8:29 AM  Result Value Ref Range   Magnesium  2.2 1.6 - 2.3 mg/dL  Phosphorus     Status: None   Collection Time: 08/01/23  8:29 AM  Result Value Ref Range   Phosphorus 3.6 3.0 - 4.3 mg/dL     Lab Results  Component Value Date   TSH 0.531 09/13/2022   TSH 0.83 07/09/2020   TSH 0.744 04/14/2016   TSH 0.572 02/15/2013   TSH 0.465 10/09/2008   FREET4 1.13 09/13/2022  December 11, 2017 abdominal ultrasound IMPRESSION: Mildly heterogeneous fatty parenchyma which could reflect areas of mild geographic fatty infiltration.  No discrete hepatic mass or nodularity seen.   Post cholecystectomy without biliary dilatation.  Assessment & Plan:   1. Elevated alkaline phosphatase level   2.  Vitamin D  deficiency  3.  Hypophosphatemia  4.  Hypomagnesemia  - [name]  is being seen at a kind request of Zheng, Jacky, MD. - I have reviewed  her new and available  records and clinically evaluated the patient. - Based on these reviews, she has persistently elevated alkaline phosphatase of likely multifactorial etiology, currently at 191.   -She did have workup to rule out GI causes.  She did have normal GGT.  Her presentation has been significant for profound vitamin D  deficiency, responding to supplements.  She is advised to initiate discontinue ergocalciferol  50,000 units weekly.  She will continue with colecalciferol 5000 units daily.   Her magnesium  and phosphorus seems to be replete now.  Her bone scan did not show any lytic lesions nor evidence of Paget's disease.   Her DEXA scan is showing osteopenia with FRAX showing major osteoporotic fracture risk of 7.7% and hip  fracture risk of 0.4% over the next 10 years.  She could not discontinue her diclofenac  even for observation. She will be continued on expectant management utilizing vitamin D3.    This potentially explains the elevated alk phos and related electrolyte abnormalities.  I advised her to be consistent with her Lipitor as she is high risk for MASLD.  -Alkaline phosphatase elevation is also infrequently observed with fatty liver disease which the patient has.   Whole Food , plant-based diet was discussed and recommended to her.  She is not given magnesium  and phosphate supplements at this time.  She will have repeat labs involving magnesium , phosphorus, vitamin D , CMP. Her previsit labs show normal PTH of 27 associated with normocalcemia.    - she is advised to maintain close follow up with Elicia Hamlet, MD for primary care needs.   I spent  25  minutes in the care of the patient today including review of labs from Thyroid  Function, CMP, and other relevant labs ; imaging/biopsy records (current and previous including abstractions from other facilities); face-to-face time discussing  her lab results and symptoms, medications doses, her options of short and long term  treatment based on the latest standards of care / guidelines;   and documenting the encounter.  [name]  participated in the discussions, expressed understanding, and voiced agreement with the above plans.  All questions were answered to her satisfaction. she is encouraged to contact clinic should she have any questions or concerns prior to her return visit.   Follow up plan: Return in about 6 months (around 03/02/2024) for Fasting Labs  in AM B4 8.   Ranny Earl, MD [facility] [address] [place], [place] [address] Phone: [phone]  Fax: [phone]     08/31/2023, 9:40 AM  This note was partially dictated with voice recognition software. Similar sounding words can be transcribed inadequately or may not  be corrected upon review.

## 2023-08-31 NOTE — Telephone Encounter (Signed)
 Will address at upcoming ov

## 2023-08-31 NOTE — Telephone Encounter (Signed)
 See other note

## 2023-09-02 ENCOUNTER — Ambulatory Visit: Admitting: Gastroenterology
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
## 2023-09-17 ENCOUNTER — Other Ambulatory Visit: Payer: Self-pay | Admitting: "Endocrinology

## 2023-09-18 ENCOUNTER — Other Ambulatory Visit: Payer: Self-pay | Admitting: Family Medicine

## 2023-10-07 ENCOUNTER — Ambulatory Visit: Admitting: Family Medicine

## 2023-10-10 ENCOUNTER — Other Ambulatory Visit: Payer: Self-pay | Admitting: Family Medicine

## 2023-10-10 ENCOUNTER — Ambulatory Visit: Admitting: Gastroenterology

## 2023-10-14 ENCOUNTER — Ambulatory Visit: Admitting: Family Medicine

## 2023-10-25 ENCOUNTER — Encounter: Admitting: Internal Medicine

## 2023-10-25 NOTE — Progress Notes (Deleted)
 Office Visit Note  Patient: [name]             Date of Birth: 09/09/1960           MRN: [id number]             PCP: Elicia Hamlet, MD Referring: Ezzard Sonny GORMAN DEVONNA Visit Date: 10/25/2023 Occupation: Data Unavailable  Subjective:  No chief complaint on file.   History of Present Illness: [name] is a 63 y.o. female ***     Activities of Daily Living:  Patient reports morning stiffness for *** {minute/hour:19697}.   Patient {ACTIONS;DENIES/REPORTS:21021675::Denies} nocturnal pain.  Difficulty dressing/grooming: {ACTIONS;DENIES/REPORTS:21021675::Denies} Difficulty climbing stairs: {ACTIONS;DENIES/REPORTS:21021675::Denies} Difficulty getting out of chair: {ACTIONS;DENIES/REPORTS:21021675::Denies} Difficulty using hands for taps, buttons, cutlery, and/or writing: {ACTIONS;DENIES/REPORTS:21021675::Denies}  No Rheumatology ROS completed.   PMFS History:  Patient Active Problem List   Diagnosis Date Noted   Mixed hyperlipidemia 08/31/2023   Vitamin D  deficiency 06/29/2023   Hypomagnesemia 06/29/2023   At high risk for fracture 04/25/2023   Abdominal pain 08/24/2022   GERD (gastroesophageal reflux disease) 08/24/2022   Lower abdominal pain 08/24/2022   Loss of weight 08/24/2022   Cheilosis 05/21/2021   Burning sensation of toe and foot 05/21/2021   Abdominal pain, epigastric 02/24/2021   Lichen planus atrophicus 02/09/2021   Acute conjunctivitis of both eyes 03/25/2020   Radial tunnel syndrome 02/06/2019   Sacroiliac inflammation 05/23/2018   Poor sleep hygiene 05/23/2018   Chronic obstructive pulmonary disease with (acute) exacerbation (HCC) 05/23/2018   Orthostatic hypotension 05/23/2018   Syncope and collapse 05/23/2018   Elevated fasting glucose 05/23/2018   Nocturia more than twice per night 05/23/2018   Snoring 05/23/2018   Hemorrhoid 02/15/2018   Abdominal distention 02/15/2018   Anemia 01/09/2018   Pain of upper abdomen 01/09/2018    Healthcare maintenance 12/30/2017   Antibiotic-associated diarrhea 12/22/2017   Vagina itching 12/22/2017   Sepsis (HCC)    Respiratory distress    Partial small bowel obstruction (HCC) 12/11/2017   COPD with acute exacerbation (HCC) 12/11/2017   Hypophosphatemia 12/11/2017   Sepsis due to undetermined organism (HCC) 12/10/2017   AKI (acute kidney injury) 12/10/2017   Hyperbilirubinemia 12/10/2017   Aortic regurgitation 10/25/2017   Diastolic dysfunction 10/25/2017   Dysphagia 01/20/2017   Left hip pain 01/19/2017   Right hand pain 01/19/2017   Diarrhea 10/11/2016   Right hip pain 06/07/2016   Dysuria 04/14/2016   Malaise and fatigue 04/14/2016   Breast pain, left 11/06/2015   Mass of left forearm 10/10/2015    Class: Chronic   Dysphagia, oropharyngeal    Gastroesophageal reflux disease without esophagitis    Carpal tunnel syndrome, right 07/11/2015    Class: Chronic   Prediabetes 07/09/2015   Lumbar stenosis with neurogenic claudication 01/05/2015   Spondylolisthesis of lumbar region 01/03/2015    Class: Chronic   Soft tissue mass 07/23/2014    Class: Acute   Coccygodynia 07/23/2014    Class: Acute   Constipation 02/15/2014   Dysphagia, pharyngoesophageal phase    Elevated alkaline phosphatase level 11/06/2013   Rectal bleeding 11/06/2013   Obesity, unspecified 02/15/2013   Genital herpes 10/12/2012   HSV (herpes simplex virus) anogenital infection 09/29/2012   UNSPECIFIED SLEEP APNEA 12/04/2008   ADENOCARCINOMA, LUNG 10/09/2008   Osteoarthritis of multiple joints 07/13/2006   HYPERCHOLESTEROLEMIA 03/17/2006   Major depressive disorder, recurrent episode 03/17/2006   HYPERTENSION, BENIGN SYSTEMIC 03/17/2006   GASTROESOPHAGEAL REFLUX, NO ESOPHAGITIS 03/17/2006   Insomnia due to medical condition  03/17/2006    Past Medical History:  Diagnosis Date   Anxiety    Aortic insufficiency    a. 10/2017 Echo: Mod AI; b. 04/2020 Echo: Mod AI.   Asthma    every now and  then.  Wheezing and coughing   Cancer (HCC) 1997   left lung   Chronic abdominal pain    Chronic back pain    Chronic joint pain    Depression    Diastolic dysfunction    a. 10/2017 Echo: EF 60-65%, GrII DD, mod AI; b. 04/2020 Echo: EF 60-65%, no rwma, GrI DD, nl RV fxn, mod AI.   Essential hypertension    GERD (gastroesophageal reflux disease)    HLD (hyperlipidemia)    Palpitations 01/2013   a. 11/2017 Holter: Occas PACs, rare PVCs. Rare nonsustained atrial tachycardia; b. 01/2018 Event monitor: Predominantly sinus rhythm @ 90 (64-135). No significant arrhythmias.   Shortness of breath dyspnea    with exertion    Family History  Problem Relation Age of Onset   Diabetes Other    Heart disease Other        has pace maker   Hypertension Mother    Kidney disease Mother    Hypertension Father    Kidney disease Father    Heart disease Father    Cancer Father        leukemia   Hypertension Sister    Hypertension Sister    Colon cancer Neg Hx    Past Surgical History:  Procedure Laterality Date   ABDOMINAL HYSTERECTOMY     ABDOMINAL SURGERY     ovarian cyst removal   BALLOON DILATION N/A 02/27/2021   Procedure: BALLOON DILATION;  Surgeon: Cindie Carlin POUR, DO;  [facility]: [facility];  Service: Endoscopy;  Laterality: N/A;   BIOPSY N/A 11/22/2013   Procedure: GASTRIC BIOPSY;  Surgeon: Lamar CHRISTELLA Hollingshead, MD;  Location: [facility];  Service: Endoscopy;  Laterality: N/A;   BIOPSY  02/27/2021   Procedure: BIOPSY;  Surgeon: Cindie Carlin POUR, DO;  [facility];  Service: Endoscopy;;   BIOPSY  05/12/2022   Procedure: BIOPSY;  Surgeon: Hollingshead Lamar CHRISTELLA, MD;  [facility]: [facility];  Service: Endoscopy;;   BLADDER SURGERY  suspension x4   x 4   CARPAL TUNNEL RELEASE Right 07/11/2015   Procedure: RIGHT CARPAL TUNNEL RELEASE;  Surgeon: Lynwood FORBES Better, MD;  Location: [facility];  Service: Orthopedics;  Laterality: Right;   CARPAL TUNNEL RELEASE Left 05/19/2022    Procedure: CARPAL TUNNEL RELEASE;  Surgeon: Romona Harari, MD;  Location:  S[facility]ERY [facility];  Service: Orthopedics;  Laterality: Left;  or MAC with regional 60   CHOLECYSTECTOMY N/A 04/05/2014   Procedure: LAPAROSCOPIC CHOLECYSTECTOMY;  Surgeon: Oneil Budge Md, MD;  Location: [facility];  Service: General;  Laterality: N/A;   COCCYGECTOMY N/A 04/11/2015   Procedure: COCCYGECTOMY WITH OSTEOTOMY THROUGH AREA OF DEFORMITY;  Surgeon: Lynwood FORBES Better, MD;  Location: [facility];  Service: Orthopedics;  Laterality: N/A;   COLONOSCOPY     COLONOSCOPY WITH PROPOFOL  N/A 11/22/2013   Dr. Hollingshead: Grade 3 and 4/internal hemorrhoids?"likely source of hematochezia Normal colonoscopy otherwise.    COLONOSCOPY WITH PROPOFOL  N/A 09/15/2017   Dr. Thersia: Hemorrhoids, next colonoscopy 10 years   ESOPHAGOGASTRODUODENOSCOPY (EGD) WITH PROPOFOL  N/A 11/22/2013   Dr. Hollingshead: Incomplete Schatzki's ring dilated and disrupted as described above.  Small hiatal hernia. Focally abnormal gastric mucosa of uncertain significance status post biopsy, reactive gastropathy, negative H.pylori  ESOPHAGOGASTRODUODENOSCOPY (EGD) WITH PROPOFOL  N/A 08/04/2015   Dr. Shaaron: mild Schatzki's ring s/p dilation, small hiatal hernia    ESOPHAGOGASTRODUODENOSCOPY (EGD) WITH PROPOFOL  N/A 03/17/2017   Mild Schatki's ring s/p dilation, small hiatal hernia, otherwise normal   ESOPHAGOGASTRODUODENOSCOPY (EGD) WITH PROPOFOL  N/A 09/18/2018   Dr. Shaaron: Schatzki ring status post dilation and disruption.  Hiatal hernia.   ESOPHAGOGASTRODUODENOSCOPY (EGD) WITH PROPOFOL  N/A 12/20/2019   non-obstructing Schatzki's ring s/p dilation with 56, 58, and 60 F.   ESOPHAGOGASTRODUODENOSCOPY (EGD) WITH PROPOFOL  N/A 02/27/2021   Procedure: ESOPHAGOGASTRODUODENOSCOPY (EGD) WITH PROPOFOL ;  Surgeon: Cindie Carlin POUR, DO;  [facility];  Service: Endoscopy;  Laterality: N/A;  11:30am   ESOPHAGOGASTRODUODENOSCOPY (EGD) WITH PROPOFOL  N/A 05/12/2022    Procedure: ESOPHAGOGASTRODUODENOSCOPY (EGD) WITH PROPOFOL ;  Surgeon: Shaaron Lamar HERO, MD;  [facility];  Service: Endoscopy;  Laterality: N/A;  8:30 am   ESOPHAGOGASTRODUODENOSCOPY (EGD) WITH PROPOFOL  N/A 03/31/2023   Procedure: ESOPHAGOGASTRODUODENOSCOPY (EGD) WITH PROPOFOL ;  Surgeon: Shaaron Lamar HERO, MD;  [facility];  Service: Endoscopy;  Laterality: N/A;  900am, asa 1/2   EXCISION VAGINAL CYST Left 06/20/2012   Procedure: EXCISION LEFT LABIAL CYST;  Surgeon: Norleen LULLA Server, MD;  Location: [facility];  Service: Gynecology;  Laterality: Left;   GANGLION CYST EXCISION Left 05/19/2022   Procedure: left volar carpal ganglion cyst excision;  Surgeon: Romona Harari, MD;  Location: [facility];  Service: Orthopedics;  Laterality: Left;  or MAC with regional 60   LOBECTOMY Left    LUNG CANCER SURGERY  1997   age 36, resection only   MALONEY DILATION N/A 11/22/2013   Procedure: MALONEY DILATION;  Surgeon: Lamar HERO Shaaron, MD;  Location: [facility];  Service: Endoscopy;  Laterality: N/A;  56   MALONEY DILATION N/A 08/04/2015   Procedure: AGAPITO DILATION;  Surgeon: Lamar HERO Shaaron, MD;  [facility];  Service: Endoscopy;  Laterality: N/A;   MALONEY DILATION N/A 03/17/2017   Procedure: AGAPITO DILATION;  Surgeon: Shaaron Lamar HERO, MD;  [facility];  Service: Endoscopy;  Laterality: N/A;   MALONEY DILATION N/A 09/18/2018   Procedure: AGAPITO DILATION;  Surgeon: Shaaron Lamar HERO, MD;  [facility];  Service: Endoscopy;  Laterality: N/A;   MALONEY DILATION N/A 12/20/2019   Procedure: AGAPITO DILATION;  Surgeon: Shaaron Lamar HERO, MD;  [facility];  Service: Endoscopy;  Laterality: N/A;   MALONEY DILATION N/A 05/12/2022   Procedure: AGAPITO DILATION;  Surgeon: Shaaron Lamar HERO, MD;  [facility];  Service: Endoscopy;  Laterality: N/A;   MALONEY DILATION N/A 03/31/2023   Procedure: DILATION, ESOPHAGUS, USING MALONEY DILATOR;   Surgeon: Shaaron Lamar HERO, MD;  [facility];  Service: Endoscopy;  Laterality: N/A;   MASS EXCISION N/A 07/23/2014   Procedure: EXCISIONAL BIOPSY NODULE LEFT PARACOCCYGEAL AREA;  Surgeon: Lynwood FORBES Better, MD;  Location[facility];  Service: Orthopedics;  Laterality: N/A;   MASS EXCISION Left 10/10/2015   Procedure: EXCISIONAL BIOPSY OF LEFT DORSORADIAL FOREARM MASS LIPOMA;  Surgeon: Lynwood FORBES Better, MD;  Location: [facility];  Service: Orthopedics;  Laterality: Left;   TUBAL LIGATION     Social History   Tobacco Use   Smoking status: Former    Current packs/day: 0.00    Average packs/day: 1.5 packs/day for 25.0 years (37.5 ttl pk-yrs)    Types: Cigarettes    Start date: 04/07/1970    Quit date: 04/07/1995  Years since quitting: 28.5   Smokeless tobacco: Never  Vaping Use   Vaping status: Never Used  Substance Use Topics   Alcohol use: No   Drug use: No   Social History   Social History Narrative   Not on file     Immunization History  Administered Date(s) Administered   Influenza Whole 10/24/2007   Td 09/19/2002     Objective: Vital Signs: There were no vitals taken for this visit.   Physical Exam   Musculoskeletal Exam: ***  CDAI Exam: CDAI Score: -- Patient Global: --; Provider Global: -- Swollen: --; Tender: -- Joint Exam 10/25/2023   No joint exam has been documented for this visit   There is currently no information documented on the homunculus. Go to the Rheumatology activity and complete the homunculus joint exam.  Investigation: No additional findings.  Imaging: No results found.  Recent Labs: Lab Results  Component Value Date   WBC 4.8 03/18/2023   HGB 14.3 03/18/2023   PLT 289 03/18/2023   NA 141 08/01/2023   K 4.8 08/01/2023   CL 100 08/01/2023   CO2 26 08/01/2023   GLUCOSE 90 08/01/2023   BUN 6 (L) 08/01/2023   CREATININE 0.82 08/01/2023   BILITOT 0.3 08/01/2023   ALKPHOS 191 (H) 08/01/2023   AST 12 08/01/2023    ALT 13 08/01/2023   PROT 7.5 08/01/2023   ALBUMIN 4.3 08/01/2023   CALCIUM  9.5 08/01/2023   GFRAA 113 07/09/2020    Speciality Comments: No specialty comments available.  Procedures:  No procedures performed Allergies: Promethazine  hcl   Assessment / Plan:     Visit Diagnoses: No diagnosis found.  Orders: No orders of the defined types were placed in this encounter.  No orders of the defined types were placed in this encounter.   Face-to-face time spent with patient was *** minutes. Greater than 50% of time was spent in counseling and coordination of care.  Follow-Up Instructions: No follow-ups on file.   Lonni LELON Ester, MD  Note - This record has been created using [employer].  Chart creation errors have been sought, but may not always  have been located. Such creation errors do not reflect on  the standard of medical care.

## 2023-11-07 ENCOUNTER — Other Ambulatory Visit: Payer: Self-pay | Admitting: Family Medicine

## 2023-11-13 ENCOUNTER — Other Ambulatory Visit: Payer: Self-pay | Admitting: Family Medicine

## 2023-11-14 ENCOUNTER — Ambulatory Visit: Admitting: Gastroenterology

## 2023-11-15 NOTE — Telephone Encounter (Signed)
 Patient calls nurse line regarding rx refill.   Patient is now completely out of medication. She is requesting refill as soon as possible.   Chiquita JAYSON English, RN

## 2023-12-05 ENCOUNTER — Other Ambulatory Visit: Payer: Self-pay | Admitting: Family Medicine

## 2023-12-05 DIAGNOSIS — A609 Anogenital herpesviral infection, unspecified: Secondary | ICD-10-CM

## 2023-12-10 ENCOUNTER — Other Ambulatory Visit: Payer: Self-pay | Admitting: Family Medicine

## 2023-12-10 DIAGNOSIS — F329 Major depressive disorder, single episode, unspecified: Secondary | ICD-10-CM

## 2023-12-13 ENCOUNTER — Other Ambulatory Visit: Payer: Self-pay | Admitting: Family Medicine

## 2023-12-27 ENCOUNTER — Ambulatory Visit: Admitting: Gastroenterology

## 2023-12-28 ENCOUNTER — Encounter: Payer: Self-pay | Admitting: Gastroenterology

## 2024-01-03 ENCOUNTER — Other Ambulatory Visit: Payer: Self-pay | Admitting: Family Medicine

## 2024-01-13 ENCOUNTER — Other Ambulatory Visit: Payer: Self-pay | Admitting: "Endocrinology

## 2024-01-16 ENCOUNTER — Other Ambulatory Visit: Payer: Self-pay | Admitting: Family Medicine

## 2024-01-27 ENCOUNTER — Ambulatory Visit: Admitting: Internal Medicine

## 2024-01-30 ENCOUNTER — Telehealth: Payer: Self-pay | Admitting: Gastroenterology

## 2024-01-30 ENCOUNTER — Encounter: Payer: Self-pay | Admitting: *Deleted

## 2024-01-30 ENCOUNTER — Encounter: Payer: Self-pay | Admitting: Gastroenterology

## 2024-01-30 ENCOUNTER — Ambulatory Visit: Admitting: Gastroenterology

## 2024-01-30 VITALS — BP 110/64 | HR 76 | Temp 97.9°F | Ht 64.0 in | Wt 159.1 lb

## 2024-01-30 DIAGNOSIS — R748 Abnormal levels of other serum enzymes: Secondary | ICD-10-CM | POA: Diagnosis not present

## 2024-01-30 DIAGNOSIS — R1310 Dysphagia, unspecified: Secondary | ICD-10-CM | POA: Diagnosis not present

## 2024-01-30 DIAGNOSIS — K219 Gastro-esophageal reflux disease without esophagitis: Secondary | ICD-10-CM | POA: Diagnosis not present

## 2024-01-30 DIAGNOSIS — K5909 Other constipation: Secondary | ICD-10-CM | POA: Diagnosis not present

## 2024-01-30 DIAGNOSIS — K2100 Gastro-esophageal reflux disease with esophagitis, without bleeding: Secondary | ICD-10-CM

## 2024-01-30 DIAGNOSIS — R1010 Upper abdominal pain, unspecified: Secondary | ICD-10-CM

## 2024-01-30 DIAGNOSIS — R1319 Other dysphagia: Secondary | ICD-10-CM

## 2024-01-30 NOTE — H&P (View-Only) (Signed)
 "    GI Office Note    Referring Provider: Elicia Hamlet, MD Primary Care Physician:  Elicia Hamlet, MD  Primary Gastroenterologist: Ozell Hollingshead, MD   Chief Complaint   Chief Complaint  Patient presents with   Abdominal Pain    Having issues with abdominal pain and food feeling like it is staying in her chest.    History of Present Illness   [name] is a 64 y.o. female presenting today for same-day visit.  She was last seen February 2025 (virtual visit) for the same concerns.  She has a history of chronic constipation, chronic abdominal pain, GERD.  She completed hemorrhoid banding approximately a year ago.  She has a history of elevated alkaline phosphatase with unremarkable liver, normal GGT, negative AMA, normal alkaline phosphatase isoenzymes.  We had encouraged her to see endocrinology for possible bone source.   Discussed the use of AI scribe software for clinical note transcription with the patient, who gave verbal consent to proceed.  History of Present Illness   She has schatzki's rings requiring esophageal dilation several times. Most ecent EGD with normal appearing esophagus, reflux esophagitis noted on biopsy. Esophageal dilation with question of cervical web. She notes marked improvement of dysphagia with each dilation.   Over the past several months, dysphagia has progressively worsened for solids and intermittently for liquids, described as a throat closing up and food and liquids sitting or trying to go back up. She has frequent choking, gagging, and coughing episodes and often feels food stuck in her chest that does not clear with fluids. She has switched to softer foods due to concern for food impaction. She denies difficulty swallowing medications.  She has daily upper abdominal pain with abdominal swelling. Pain is worse after eating, with no clear food triggers, and sometimes radiates lower. She recalls being told she has a hernia in this area. She denies  dysuria.  She has new increased belching and flatulence. She denies heartburn. Bowel movements are regular. She uses Linzess  intermittently as directed and denies hematochezia or melena.   She has chronic hand and knee pain that has become more pronounced in recent months, without visible joint swelling.   She reports significant psychosocial stress related to caregiving for her ill husband.    Prior Data     Results       Latest Ref Rng & Units 08/01/2023    8:29 AM 06/06/2023    8:09 AM 02/21/2023   12:04 PM  Hepatic Function  Total Protein 6.0 - 8.5 g/dL 7.5  7.4  7.1   Albumin 3.8 - 4.9 g/dL 4.3  4.1  4.2   AST 0 - 40 IU/L 12  12  8    ALT 0 - 32 IU/L 13  10  10    Alk Phosphatase 44 - 121 IU/L 191  194  185   Total Bilirubin 0.0 - 1.2 mg/dL 0.3  0.3  0.3   Bilirubin, Direct 0.00 - 0.40 mg/dL   9.87    August 01, 2023: Bone specific alkaline phosphatase tandem-R oxidase 26.9 normal Alkaline phosphatase 191, AST 12, ALT 13, total bilirubin 0.3, creatinine 0.82, LDL 106, vitamin D  25-hydroxy 60.3  February 21, 2023: ASMA positive with 1:40 titer, ANA positive, homogenous pattern 1:160 titer, referred to rheumatology (patient did not keep appt), advised appointment with Dr. Hollingshead for elevated alk phos, positive ANA and ASMA (numerous rescheduled appointments and never saw Dr. Hollingshead), AMA negative, anti-GP 210 negative, anti-S P100 negative, antimitochondrial  M2 negative  NM bone scan April 2025 negative for metastasis. Bone density scan May 2025 showing osteopenia, managed by endocrinology  Labs 10/2022: Total bilirubin 0.4, alkaline phosphatase 177, AST 15, ALT 11, GGT 33, AMA less than 20, alkaline phosphatase isoenzymes with 57% liver, 42% bone, 1% intestinal.      CT A/P with contrast 08/2022: IMPRESSION: 1. Gas fluid levels in the proximal colon, nonspecific but can be seen in the setting of diarrheal illness or recent laxative use. 2. Prominent gastrohepatic ligament lymph  nodes measure up to 7 mm in short axis, nonspecific but likely reactive.  Colonoscopy August 2019: -Grade 3 internal hemorrhoids -External hemorrhoids -Repeat colonoscopy in 10 years   Last EGD 02/27/2021 -mild Schatzki's ring s/p dilation and biopsy, gastritis s/p biopsy, normal duodenum.  Pathology revealing reactive gastropathy. Prior EGD Dec 2021 also with Schatzki ring and dilation.   EGD 04/2022: -normal esophagus. Dilated and biopsy. Reflux esophagitis. -query dilation of a cervical web occurred with passed of maloney dilator.     Medications   Current Outpatient Medications  Medication Sig Dispense Refill   acyclovir  (ZOVIRAX ) 400 MG tablet Take 1 tablet by mouth twice daily 180 tablet 0   amitriptyline  (ELAVIL ) 150 MG tablet Take 1 tablet by mouth once daily 90 tablet 3   amLODipine  (NORVASC ) 10 MG tablet Take 1 tablet (10 mg total) by mouth daily. 90 tablet 1   atorvastatin  (LIPITOR) 40 MG tablet Take 1 tablet (40 mg total) by mouth daily. 90 tablet 3   budesonide -formoterol  (BREYNA ) 160-4.5 MCG/ACT inhaler Inhale 2 puffs by mouth twice daily 11 g 3   celecoxib  (CELEBREX ) 200 MG capsule TAKE 1 CAPSULE BY MOUTH ONCE DAILY AS NEEDED (Patient taking differently: Take 200 mg by mouth daily.) 30 capsule 1   D3 MAXIMUM STRENGTH 125 MCG (5000 UT) capsule Take 1 capsule by mouth once daily 90 capsule 0   DULoxetine  (CYMBALTA ) 60 MG capsule Take 1 capsule by mouth twice daily 180 capsule 3   esomeprazole  (NEXIUM ) 40 MG capsule Take 1 capsule (40 mg total) by mouth daily before breakfast. 90 capsule 3   linaclotide  (LINZESS ) 290 MCG CAPS capsule TAKE 1 CAPSULE BY MOUTH ONCE DAILY BEFORE BREAKFAST 30 capsule 11   pregabalin  (LYRICA ) 100 MG capsule Take 1 capsule by mouth twice daily 60 capsule 0   No current facility-administered medications for this visit.    Allergies   Allergies as of 01/30/2024 - Review Complete 01/30/2024  Allergen Reaction Noted   Promethazine  hcl Nausea And  Vomiting and Other (See Comments) 07/26/2008     Past Medical History   Past Medical History:  Diagnosis Date   Anxiety    Aortic insufficiency    a. 10/2017 Echo: Mod AI; b. 04/2020 Echo: Mod AI.   Asthma    every now and then.  Wheezing and coughing   Cancer (HCC) 1997   left lung   Chronic abdominal pain    Chronic back pain    Chronic joint pain    Depression    Diastolic dysfunction    a. 10/2017 Echo: EF 60-65%, GrII DD, mod AI; b. 04/2020 Echo: EF 60-65%, no rwma, GrI DD, nl RV fxn, mod AI.   Essential hypertension    GERD (gastroesophageal reflux disease)    HLD (hyperlipidemia)    Palpitations 01/2013   a. 11/2017 Holter: Occas PACs, rare PVCs. Rare nonsustained atrial tachycardia; b. 01/2018 Event monitor: Predominantly sinus rhythm @ 90 (64-135). No significant arrhythmias.  Shortness of breath dyspnea    with exertion    Past Surgical History   Past Surgical History:  Procedure Laterality Date   ABDOMINAL HYSTERECTOMY     ABDOMINAL SURGERY     ovarian cyst removal   BALLOON DILATION N/A 02/27/2021   Procedure: BALLOON DILATION;  Surgeon: Cindie Carlin POUR, DO;  [facility];  Service: Endoscopy;  Laterality: N/A;   BIOPSY N/A 11/22/2013   Procedure: GASTRIC BIOPSY;  Surgeon: Lamar CHRISTELLA Hollingshead, MD;  Location: [facility];  Service: Endoscopy;  Laterality: N/A;   BIOPSY  02/27/2021   Procedure: BIOPSY;  Surgeon: Cindie Carlin POUR, DO;  [facility];  Service: Endoscopy;;   BIOPSY  05/12/2022   Procedure: BIOPSY;  Surgeon: Hollingshead Lamar CHRISTELLA, MD;  [facility];  Service: Endoscopy;;   BLADDER SURGERY  suspension x4   x 4   CARPAL TUNNEL RELEASE Right 07/11/2015   Procedure: RIGHT CARPAL TUNNEL RELEASE;  Surgeon: Lynwood FORBES Better, MD;  Location: [facility];  Service: Orthopedics;  Laterality: Right;   CARPAL TUNNEL RELEASE Left 05/19/2022   Procedure: CARPAL TUNNEL RELEASE;  Surgeon: Romona Harari, MD;  Location: [facility];  Service: Orthopedics;  Laterality: Left;  or MAC with regional 60   CHOLECYSTECTOMY N/A 04/05/2014   Procedure: LAPAROSCOPIC CHOLECYSTECTOMY;  Surgeon: Oneil Budge Md, MD;  Location: [facility];  Service: General;  Laterality: N/A;   COCCYGECTOMY N/A 04/11/2015   Procedure: COCCYGECTOMY WITH OSTEOTOMY THROUGH AREA OF DEFORMITY;  Surgeon: Lynwood FORBES Better, MD;  Location: [facility];  Service: Orthopedics;  Laterality: N/A;   COLONOSCOPY     COLONOSCOPY WITH PROPOFOL  N/A 11/22/2013   Dr. Hollingshead: Grade 3 and 4/internal hemorrhoidslikely source of hematochezia Normal colonoscopy otherwise.    COLONOSCOPY WITH PROPOFOL  N/A 09/15/2017   Dr. Thersia: Hemorrhoids, next colonoscopy 10 years   ESOPHAGOGASTRODUODENOSCOPY (EGD) WITH PROPOFOL  N/A 11/22/2013   Dr. Hollingshead: Incomplete Schatzki's ring dilated and disrupted as described above.  Small hiatal hernia. Focally abnormal gastric mucosa of uncertain significance status post biopsy, reactive gastropathy, negative H.pylori   ESOPHAGOGASTRODUODENOSCOPY (EGD) WITH PROPOFOL  N/A 08/04/2015   Dr. Hollingshead: mild Schatzki's ring s/p dilation, small hiatal hernia    ESOPHAGOGASTRODUODENOSCOPY (EGD) WITH PROPOFOL  N/A 03/17/2017   Mild Schatki's ring s/p dilation, small hiatal hernia, otherwise normal   ESOPHAGOGASTRODUODENOSCOPY (EGD) WITH PROPOFOL  N/A 09/18/2018   Dr. Hollingshead: Schatzki ring status post dilation and disruption.  Hiatal hernia.   ESOPHAGOGASTRODUODENOSCOPY (EGD) WITH PROPOFOL  N/A 12/20/2019   non-obstructing Schatzki's ring s/p dilation with 56, 58, and 60 F.   ESOPHAGOGASTRODUODENOSCOPY (EGD) WITH PROPOFOL  N/A 02/27/2021   Procedure: ESOPHAGOGASTRODUODENOSCOPY (EGD) WITH PROPOFOL ;  Surgeon: Cindie Carlin POUR, DO;  [facility];  Service: Endoscopy;  Laterality: N/A;  11:30am   ESOPHAGOGASTRODUODENOSCOPY (EGD) WITH PROPOFOL  N/A 05/12/2022   Procedure: ESOPHAGOGASTRODUODENOSCOPY (EGD) WITH PROPOFOL ;  Surgeon: Hollingshead Lamar CHRISTELLA, MD;   [facility];  Service: Endoscopy;  Laterality: N/A;  8:30 am   ESOPHAGOGASTRODUODENOSCOPY (EGD) WITH PROPOFOL  N/A 03/31/2023   Procedure: ESOPHAGOGASTRODUODENOSCOPY (EGD) WITH PROPOFOL ;  Surgeon: Hollingshead Lamar CHRISTELLA, MD;  [facility];  Service: Endoscopy;  Laterality: N/A;  900am, asa 1/2   EXCISION VAGINAL CYST Left 06/20/2012   Procedure: EXCISION LEFT LABIAL CYST;  Surgeon: Norleen LULLA Server, MD;  Location: [facility];  Service: Gynecology;  Laterality: Left;   GANGLION CYST EXCISION Left 05/19/2022   Procedure: left volar carpal ganglion cyst excision;  Surgeon: Romona,  Bebe, MD;  Location: [facility];  Service: Orthopedics;  Laterality: Left;  or MAC with regional 60   LOBECTOMY Left    LUNG CANCER SURGERY  1997   age 9, resection only   MALONEY DILATION N/A 11/22/2013   Procedure: MALONEY DILATION;  Surgeon: Lamar CHRISTELLA Hollingshead, MD;  Location: [facility];  Service: Endoscopy;  Laterality: N/A;  56   MALONEY DILATION N/A 08/04/2015   Procedure: AGAPITO DILATION;  Surgeon: Lamar CHRISTELLA Hollingshead, MD;  [facility];  Service: Endoscopy;  Laterality: N/A;   MALONEY DILATION N/A 03/17/2017   Procedure: AGAPITO DILATION;  Surgeon: Hollingshead Lamar CHRISTELLA, MD;  [facility];  Service: Endoscopy;  Laterality: N/A;   MALONEY DILATION N/A 09/18/2018   Procedure: AGAPITO DILATION;  Surgeon: Hollingshead Lamar CHRISTELLA, MD;  [facility];  Service: Endoscopy;  Laterality: N/A;   MALONEY DILATION N/A 12/20/2019   Procedure: AGAPITO DILATION;  Surgeon: Hollingshead Lamar CHRISTELLA, MD;  [facility];  Service: Endoscopy;  Laterality: N/A;   MALONEY DILATION N/A 05/12/2022   Procedure: AGAPITO DILATION;  Surgeon: Hollingshead Lamar CHRISTELLA, MD;  [facility];  Service: Endoscopy;  Laterality: N/A;   MALONEY DILATION N/A 03/31/2023   Procedure: DILATION, ESOPHAGUS, USING MALONEY DILATOR;  Surgeon: Hollingshead Lamar CHRISTELLA, MD;  [facility];  Service: Endoscopy;  Laterality:  N/A;   MASS EXCISION N/A 07/23/2014   Procedure: EXCISIONAL BIOPSY NODULE LEFT PARACOCCYGEAL AREA;  Surgeon: Lynwood FORBES Better, MD;  Location: [facility];  Service: Orthopedics;  Laterality: N/A;   MASS EXCISION Left 10/10/2015   Procedure: EXCISIONAL BIOPSY OF LEFT DORSORADIAL FOREARM MASS LIPOMA;  Surgeon: Lynwood FORBES Better, MD;  Location: [facility];  Service: Orthopedics;  Laterality: Left;   TUBAL LIGATION      Past Family History   Family History  Problem Relation Age of Onset   Diabetes Other    Heart disease Other        has pace maker   Hypertension Mother    Kidney disease Mother    Hypertension Father    Kidney disease Father    Heart disease Father    Cancer Father        leukemia   Hypertension Sister    Hypertension Sister    Colon cancer Neg Hx     Past Social History   Social History   Socioeconomic History   Marital status: Married    Spouse name: Carlo   Number of children: 3   Years of education: Not on file   Highest education level: Not on file  Occupational History   Occupation: Disability  Tobacco Use   Smoking status: Former    Current packs/day: 0.00    Average packs/day: 1.5 packs/day for 25.0 years (37.5 ttl pk-yrs)    Types: Cigarettes    Start date: 04/07/1970    Quit date: 04/07/1995    Years since quitting: 28.8   Smokeless tobacco: Never  Vaping Use   Vaping status: Never Used  Substance and Sexual Activity   Alcohol use: No   Drug use: No   Sexual activity: Not Currently    Birth control/protection: Surgical    Comment: hyst  Other Topics Concern   Not on file  Social History Narrative   Not on file   Social Drivers of Health   Tobacco Use: Medium Risk (01/30/2024)   Patient History    Smoking Tobacco Use: Former  Smokeless Tobacco Use: Never    Passive Exposure: Not on file  Financial Resource Strain: Low Risk (05/12/2023)   Overall Financial Resource Strain (CARDIA)    Difficulty of Paying Living Expenses: Not  hard at all  Food Insecurity: No Food Insecurity (05/12/2023)   Hunger Vital Sign    Worried About Running Out of Food in the Last Year: Never true    Ran Out of Food in the Last Year: Never true  Transportation Needs: No Transportation Needs (05/12/2023)   PRAPARE - Administrator, Civil Service (Medical): No    Lack of Transportation (Non-Medical): No  Physical Activity: Sufficiently Active (05/12/2023)   Exercise Vital Sign    Days of Exercise per Week: 5 days    Minutes of Exercise per Session: 30 min  Stress: No Stress Concern Present (05/12/2023)   [employer] of Occupational Health - Occupational Stress Questionnaire    Feeling of Stress : Only a little  Social Connections: Moderately Isolated (05/12/2023)   Social Connection and Isolation Panel    Frequency of Communication with Friends and Family: More than three times a week    Frequency of Social Gatherings with Friends and Family: Three times a week    Attends Religious Services: Never    Active Member of Clubs or Organizations: No    Attends Banker Meetings: Never    Marital Status: Married  Catering Manager Violence: Not At Risk (05/12/2023)   Humiliation, Afraid, Rape, and Kick questionnaire    Fear of Current or Ex-Partner: No    Emotionally Abused: No    Physically Abused: No    Sexually Abused: No  Depression (PHQ2-9): Low Risk (05/12/2023)   Depression (PHQ2-9)    PHQ-2 Score: 0  Alcohol Screen: Low Risk (05/12/2023)   Alcohol Screen    Last Alcohol Screening Score (AUDIT): 0  Housing: Low Risk (05/12/2023)   Housing Stability Vital Sign    Unable to Pay for Housing in the Last Year: No    Number of Times Moved in the Last Year: 0    Homeless in the Last Year: No  Utilities: Not At Risk (05/12/2023)   [facility] Utilities    Threatened with loss of utilities: No  Health Literacy: Adequate Health Literacy (05/12/2023)   B1300 Health Literacy    Frequency of need for help with medical  instructions: Rarely    Review of Systems   General: Negative for anorexia, weight loss, fever, chills, fatigue, weakness. ENT: Negative for hoarseness, difficulty swallowing , nasal congestion. CV: Negative for chest pain, angina, palpitations, dyspnea on exertion, peripheral edema.  Respiratory: Negative for dyspnea at rest, dyspnea on exertion,+ cough, sputum, +wheezing.  GI: See history of present illness. GU:  Negative for dysuria, hematuria, urinary incontinence, urinary frequency, nocturnal urination.  Endo: Negative for unusual weight change.     Physical Exam   BP 110/64   Pulse 76   Temp 97.9 F (36.6 C) (Oral)   Ht 5' 4 (1.626 m)   Wt 159 lb 1.9 oz (72.2 kg)   SpO2 95%   BMI 27.31 kg/m    General: Well-nourished, well-developed in no acute distress.  Eyes: No icterus. Mouth: Oropharyngeal mucosa moist and pink   Lungs: bilateral wheezing Heart: Regular rate and rhythm, no murmurs rubs or gallops.  Abdomen: Bowel sounds are normal, nontender, nondistended, no hepatosplenomegaly or masses,  no abdominal bruits or hernia , no rebound or guarding.  Rectal: not performed Extremities: No lower extremity  edema. No clubbing or deformities. Neuro: Alert and oriented x 4   Skin: Warm and dry, no jaundice.   Psych: Alert and cooperative, normal mood and affect.  Labs   See above Imaging Studies   No results found.  Assessment/Plan:    Assessment & Plan Dysphagia with history of recurrent Schatzki ring and ?cervical esophageal web. Chronic dysphagia worsening, previously responsive to dilation - Scheduled upper endoscopy with esophageal dilation. ASA 3.  I have discussed the risks, alternatives, benefits with regards to but not limited to the risk of reaction to medication, bleeding, infection, perforation and the patient is agreeable to proceed. Written consent to be obtained.   Chronic abdominal pain Persistent upper abdominal pain with distension and  postprandial exacerbation  - Evaluate at time of EGD.  - chronically on celecoxib , cannot rule out gastritis/pud.  Gastroesophageal reflux disease (GERD) Well-controlled on esomeprazole ; no heartburn   - Continued daily esomeprazole .  Chronic constipation Doing well with Linzess  daily prn.  Chronically elevated alkaline phosphatase Chronic elevation; GGT previously negative. Alk phos liver fraction 57%. AMA, M2 Ab, Anti GP 210, Anti SP 100 all negative. Cannot rule out AMA negative PBC. ANA, ASMA positive. She did not see rheumatology to rule out cause for autoimmune markers, has bilateral joint pain but no swelling.  - update LFTs, IgG/IgM/IgA -last liver imaging via CT in 2024.  -keep upcoming ov with Dr. Shaaron.               Sonny RAMAN. Ezzard, MHS, PA-C [facility]  "

## 2024-01-30 NOTE — Telephone Encounter (Signed)
 [name], please arrange for patient to have updated LFTs, IgG/IgM/IgA. Dx: elevated alk phos

## 2024-01-30 NOTE — Progress Notes (Signed)
 "    GI Office Note    Referring Provider: Elicia Hamlet, MD Primary Care Physician:  Elicia Hamlet, MD  Primary Gastroenterologist: Ozell Hollingshead, MD   Chief Complaint   Chief Complaint  Patient presents with   Abdominal Pain    Having issues with abdominal pain and food feeling like it is staying in her chest.    History of Present Illness   [name] is a 64 y.o. female presenting today for same-day visit.  She was last seen February 2025 (virtual visit) for the same concerns.  She has a history of chronic constipation, chronic abdominal pain, GERD.  She completed hemorrhoid banding approximately a year ago.  She has a history of elevated alkaline phosphatase with unremarkable liver, normal GGT, negative AMA, normal alkaline phosphatase isoenzymes.  We had encouraged her to see endocrinology for possible bone source.   Discussed the use of AI scribe software for clinical note transcription with the patient, who gave verbal consent to proceed.  History of Present Illness   She has schatzki's rings requiring esophageal dilation several times. Most ecent EGD with normal appearing esophagus, reflux esophagitis noted on biopsy. Esophageal dilation with question of cervical web. She notes marked improvement of dysphagia with each dilation.   Over the past several months, dysphagia has progressively worsened for solids and intermittently for liquids, described as a throat closing up and food and liquids sitting or trying to go back up. She has frequent choking, gagging, and coughing episodes and often feels food stuck in her chest that does not clear with fluids. She has switched to softer foods due to concern for food impaction. She denies difficulty swallowing medications.  She has daily upper abdominal pain with abdominal swelling. Pain is worse after eating, with no clear food triggers, and sometimes radiates lower. She recalls being told she has a hernia in this area. She denies  dysuria.  She has new increased belching and flatulence. She denies heartburn. Bowel movements are regular. She uses Linzess  intermittently as directed and denies hematochezia or melena.   She has chronic hand and knee pain that has become more pronounced in recent months, without visible joint swelling.   She reports significant psychosocial stress related to caregiving for her ill husband.    Prior Data     Results       Latest Ref Rng & Units 08/01/2023    8:29 AM 06/06/2023    8:09 AM 02/21/2023   12:04 PM  Hepatic Function  Total Protein 6.0 - 8.5 g/dL 7.5  7.4  7.1   Albumin 3.8 - 4.9 g/dL 4.3  4.1  4.2   AST 0 - 40 IU/L 12  12  8    ALT 0 - 32 IU/L 13  10  10    Alk Phosphatase 44 - 121 IU/L 191  194  185   Total Bilirubin 0.0 - 1.2 mg/dL 0.3  0.3  0.3   Bilirubin, Direct 0.00 - 0.40 mg/dL   9.87    August 01, 2023: Bone specific alkaline phosphatase tandem-R oxidase 26.9 normal Alkaline phosphatase 191, AST 12, ALT 13, total bilirubin 0.3, creatinine 0.82, LDL 106, vitamin D  25-hydroxy 60.3  February 21, 2023: ASMA positive with 1:40 titer, ANA positive, homogenous pattern 1:160 titer, referred to rheumatology (patient did not keep appt), advised appointment with Dr. Hollingshead for elevated alk phos, positive ANA and ASMA (numerous rescheduled appointments and never saw Dr. Hollingshead), AMA negative, anti-GP 210 negative, anti-S P100 negative, antimitochondrial  M2 negative  NM bone scan April 2025 negative for metastasis. Bone density scan May 2025 showing osteopenia, managed by endocrinology  Labs 10/2022: Total bilirubin 0.4, alkaline phosphatase 177, AST 15, ALT 11, GGT 33, AMA less than 20, alkaline phosphatase isoenzymes with 57% liver, 42% bone, 1% intestinal.      CT A/P with contrast 08/2022: IMPRESSION: 1. Gas fluid levels in the proximal colon, nonspecific but can be seen in the setting of diarrheal illness or recent laxative use. 2. Prominent gastrohepatic ligament lymph  nodes measure up to 7 mm in short axis, nonspecific but likely reactive.  Colonoscopy August 2019: -Grade 3 internal hemorrhoids -External hemorrhoids -Repeat colonoscopy in 10 years   Last EGD 02/27/2021 -mild Schatzki's ring s/p dilation and biopsy, gastritis s/p biopsy, normal duodenum.  Pathology revealing reactive gastropathy. Prior EGD Dec 2021 also with Schatzki ring and dilation.   EGD 04/2022: -normal esophagus. Dilated and biopsy. Reflux esophagitis. -query dilation of a cervical web occurred with passed of maloney dilator.     Medications   Current Outpatient Medications  Medication Sig Dispense Refill   acyclovir  (ZOVIRAX ) 400 MG tablet Take 1 tablet by mouth twice daily 180 tablet 0   amitriptyline  (ELAVIL ) 150 MG tablet Take 1 tablet by mouth once daily 90 tablet 3   amLODipine  (NORVASC ) 10 MG tablet Take 1 tablet (10 mg total) by mouth daily. 90 tablet 1   atorvastatin  (LIPITOR) 40 MG tablet Take 1 tablet (40 mg total) by mouth daily. 90 tablet 3   budesonide -formoterol  (BREYNA ) 160-4.5 MCG/ACT inhaler Inhale 2 puffs by mouth twice daily 11 g 3   celecoxib  (CELEBREX ) 200 MG capsule TAKE 1 CAPSULE BY MOUTH ONCE DAILY AS NEEDED (Patient taking differently: Take 200 mg by mouth daily.) 30 capsule 1   D3 MAXIMUM STRENGTH 125 MCG (5000 UT) capsule Take 1 capsule by mouth once daily 90 capsule 0   DULoxetine  (CYMBALTA ) 60 MG capsule Take 1 capsule by mouth twice daily 180 capsule 3   esomeprazole  (NEXIUM ) 40 MG capsule Take 1 capsule (40 mg total) by mouth daily before breakfast. 90 capsule 3   linaclotide  (LINZESS ) 290 MCG CAPS capsule TAKE 1 CAPSULE BY MOUTH ONCE DAILY BEFORE BREAKFAST 30 capsule 11   pregabalin  (LYRICA ) 100 MG capsule Take 1 capsule by mouth twice daily 60 capsule 0   No current facility-administered medications for this visit.    Allergies   Allergies as of 01/30/2024 - Review Complete 01/30/2024  Allergen Reaction Noted   Promethazine  hcl Nausea And  Vomiting and Other (See Comments) 07/26/2008     Past Medical History   Past Medical History:  Diagnosis Date   Anxiety    Aortic insufficiency    a. 10/2017 Echo: Mod AI; b. 04/2020 Echo: Mod AI.   Asthma    every now and then.  Wheezing and coughing   Cancer (HCC) 1997   left lung   Chronic abdominal pain    Chronic back pain    Chronic joint pain    Depression    Diastolic dysfunction    a. 10/2017 Echo: EF 60-65%, GrII DD, mod AI; b. 04/2020 Echo: EF 60-65%, no rwma, GrI DD, nl RV fxn, mod AI.   Essential hypertension    GERD (gastroesophageal reflux disease)    HLD (hyperlipidemia)    Palpitations 01/2013   a. 11/2017 Holter: Occas PACs, rare PVCs. Rare nonsustained atrial tachycardia; b. 01/2018 Event monitor: Predominantly sinus rhythm @ 90 (64-135). No significant arrhythmias.  Shortness of breath dyspnea    with exertion    Past Surgical History   Past Surgical History:  Procedure Laterality Date   ABDOMINAL HYSTERECTOMY     ABDOMINAL SURGERY     ovarian cyst removal   BALLOON DILATION N/A 02/27/2021   Procedure: BALLOON DILATION;  Surgeon: Cindie Carlin POUR, DO;  [facility];  Service: Endoscopy;  Laterality: N/A;   BIOPSY N/A 11/22/2013   Procedure: GASTRIC BIOPSY;  Surgeon: Lamar CHRISTELLA Hollingshead, MD;  Location: [facility];  Service: Endoscopy;  Laterality: N/A;   BIOPSY  02/27/2021   Procedure: BIOPSY;  Surgeon: Cindie Carlin POUR, DO;  [facility];  Service: Endoscopy;;   BIOPSY  05/12/2022   Procedure: BIOPSY;  Surgeon: Hollingshead Lamar CHRISTELLA, MD;  [facility];  Service: Endoscopy;;   BLADDER SURGERY  suspension x4   x 4   CARPAL TUNNEL RELEASE Right 07/11/2015   Procedure: RIGHT CARPAL TUNNEL RELEASE;  Surgeon: Lynwood FORBES Better, MD;  Location: [facility];  Service: Orthopedics;  Laterality: Right;   CARPAL TUNNEL RELEASE Left 05/19/2022   Procedure: CARPAL TUNNEL RELEASE;  Surgeon: Romona Harari, MD;  Location: [facility];  Service: Orthopedics;  Laterality: Left;  or MAC with regional 60   CHOLECYSTECTOMY N/A 04/05/2014   Procedure: LAPAROSCOPIC CHOLECYSTECTOMY;  Surgeon: Oneil Budge Md, MD;  Location: [facility];  Service: General;  Laterality: N/A;   COCCYGECTOMY N/A 04/11/2015   Procedure: COCCYGECTOMY WITH OSTEOTOMY THROUGH AREA OF DEFORMITY;  Surgeon: Lynwood FORBES Better, MD;  Location: [facility];  Service: Orthopedics;  Laterality: N/A;   COLONOSCOPY     COLONOSCOPY WITH PROPOFOL  N/A 11/22/2013   Dr. Hollingshead: Grade 3 and 4/internal hemorrhoidslikely source of hematochezia Normal colonoscopy otherwise.    COLONOSCOPY WITH PROPOFOL  N/A 09/15/2017   Dr. Thersia: Hemorrhoids, next colonoscopy 10 years   ESOPHAGOGASTRODUODENOSCOPY (EGD) WITH PROPOFOL  N/A 11/22/2013   Dr. Hollingshead: Incomplete Schatzki's ring dilated and disrupted as described above.  Small hiatal hernia. Focally abnormal gastric mucosa of uncertain significance status post biopsy, reactive gastropathy, negative H.pylori   ESOPHAGOGASTRODUODENOSCOPY (EGD) WITH PROPOFOL  N/A 08/04/2015   Dr. Hollingshead: mild Schatzki's ring s/p dilation, small hiatal hernia    ESOPHAGOGASTRODUODENOSCOPY (EGD) WITH PROPOFOL  N/A 03/17/2017   Mild Schatki's ring s/p dilation, small hiatal hernia, otherwise normal   ESOPHAGOGASTRODUODENOSCOPY (EGD) WITH PROPOFOL  N/A 09/18/2018   Dr. Hollingshead: Schatzki ring status post dilation and disruption.  Hiatal hernia.   ESOPHAGOGASTRODUODENOSCOPY (EGD) WITH PROPOFOL  N/A 12/20/2019   non-obstructing Schatzki's ring s/p dilation with 56, 58, and 60 F.   ESOPHAGOGASTRODUODENOSCOPY (EGD) WITH PROPOFOL  N/A 02/27/2021   Procedure: ESOPHAGOGASTRODUODENOSCOPY (EGD) WITH PROPOFOL ;  Surgeon: Cindie Carlin POUR, DO;  [facility];  Service: Endoscopy;  Laterality: N/A;  11:30am   ESOPHAGOGASTRODUODENOSCOPY (EGD) WITH PROPOFOL  N/A 05/12/2022   Procedure: ESOPHAGOGASTRODUODENOSCOPY (EGD) WITH PROPOFOL ;  Surgeon: Hollingshead Lamar CHRISTELLA, MD;   [facility];  Service: Endoscopy;  Laterality: N/A;  8:30 am   ESOPHAGOGASTRODUODENOSCOPY (EGD) WITH PROPOFOL  N/A 03/31/2023   Procedure: ESOPHAGOGASTRODUODENOSCOPY (EGD) WITH PROPOFOL ;  Surgeon: Hollingshead Lamar CHRISTELLA, MD;  [facility];  Service: Endoscopy;  Laterality: N/A;  900am, asa 1/2   EXCISION VAGINAL CYST Left 06/20/2012   Procedure: EXCISION LEFT LABIAL CYST;  Surgeon: Norleen LULLA Server, MD;  Location: [facility];  Service: Gynecology;  Laterality: Left;   GANGLION CYST EXCISION Left 05/19/2022   Procedure: left volar carpal ganglion cyst excision;  Surgeon: Romona,  Bebe, MD;  Location: [facility];  Service: Orthopedics;  Laterality: Left;  or MAC with regional 60   LOBECTOMY Left    LUNG CANCER SURGERY  1997   age 9, resection only   MALONEY DILATION N/A 11/22/2013   Procedure: MALONEY DILATION;  Surgeon: Lamar CHRISTELLA Hollingshead, MD;  Location: [facility];  Service: Endoscopy;  Laterality: N/A;  56   MALONEY DILATION N/A 08/04/2015   Procedure: AGAPITO DILATION;  Surgeon: Lamar CHRISTELLA Hollingshead, MD;  [facility];  Service: Endoscopy;  Laterality: N/A;   MALONEY DILATION N/A 03/17/2017   Procedure: AGAPITO DILATION;  Surgeon: Hollingshead Lamar CHRISTELLA, MD;  [facility];  Service: Endoscopy;  Laterality: N/A;   MALONEY DILATION N/A 09/18/2018   Procedure: AGAPITO DILATION;  Surgeon: Hollingshead Lamar CHRISTELLA, MD;  [facility];  Service: Endoscopy;  Laterality: N/A;   MALONEY DILATION N/A 12/20/2019   Procedure: AGAPITO DILATION;  Surgeon: Hollingshead Lamar CHRISTELLA, MD;  [facility];  Service: Endoscopy;  Laterality: N/A;   MALONEY DILATION N/A 05/12/2022   Procedure: AGAPITO DILATION;  Surgeon: Hollingshead Lamar CHRISTELLA, MD;  [facility];  Service: Endoscopy;  Laterality: N/A;   MALONEY DILATION N/A 03/31/2023   Procedure: DILATION, ESOPHAGUS, USING MALONEY DILATOR;  Surgeon: Hollingshead Lamar CHRISTELLA, MD;  [facility];  Service: Endoscopy;  Laterality:  N/A;   MASS EXCISION N/A 07/23/2014   Procedure: EXCISIONAL BIOPSY NODULE LEFT PARACOCCYGEAL AREA;  Surgeon: Lynwood FORBES Better, MD;  Location[facility];  Service: Orthopedics;  Laterality: N/A;   MASS EXCISION Left 10/10/2015   Procedure: EXCISIONAL BIOPSY OF LEFT DORSORADIAL FOREARM MASS LIPOMA;  Surgeon: Lynwood FORBES Better, MD;  Location: [facility];  Service: Orthopedics;  Laterality: Left;   TUBAL LIGATION      Past Family History   Family History  Problem Relation Age of Onset   Diabetes Other    Heart disease Other        has pace maker   Hypertension Mother    Kidney disease Mother    Hypertension Father    Kidney disease Father    Heart disease Father    Cancer Father        leukemia   Hypertension Sister    Hypertension Sister    Colon cancer Neg Hx     Past Social History   Social History   Socioeconomic History   Marital status: Married    Spouse name: Carlo   Number of children: 3   Years of education: Not on file   Highest education level: Not on file  Occupational History   Occupation: Disability  Tobacco Use   Smoking status: Former    Current packs/day: 0.00    Average packs/day: 1.5 packs/day for 25.0 years (37.5 ttl pk-yrs)    Types: Cigarettes    Start date: 04/07/1970    Quit date: 04/07/1995    Years since quitting: 28.8   Smokeless tobacco: Never  Vaping Use   Vaping status: Never Used  Substance and Sexual Activity   Alcohol use: No   Drug use: No   Sexual activity: Not Currently    Birth control/protection: Surgical    Comment: hyst  Other Topics Concern   Not on file  Social History Narrative   Not on file   Social Drivers of Health   Tobacco Use: Medium Risk (01/30/2024)   Patient History    Smoking Tobacco Use: Former  Smokeless Tobacco Use: Never    Passive Exposure: Not on file  Financial Resource Strain: Low Risk (05/12/2023)   Overall Financial Resource Strain (CARDIA)    Difficulty of Paying Living Expenses: Not  hard at all  Food Insecurity: No Food Insecurity (05/12/2023)   Hunger Vital Sign    Worried About Running Out of Food in the Last Year: Never true    Ran Out of Food in the Last Year: Never true  Transportation Needs: No Transportation Needs (05/12/2023)   PRAPARE - Administrator, Civil Service (Medical): No    Lack of Transportation (Non-Medical): No  Physical Activity: Sufficiently Active (05/12/2023)   Exercise Vital Sign    Days of Exercise per Week: 5 days    Minutes of Exercise per Session: 30 min  Stress: No Stress Concern Present (05/12/2023)   [employer] of Occupational Health - Occupational Stress Questionnaire    Feeling of Stress : Only a little  Social Connections: Moderately Isolated (05/12/2023)   Social Connection and Isolation Panel    Frequency of Communication with Friends and Family: More than three times a week    Frequency of Social Gatherings with Friends and Family: Three times a week    Attends Religious Services: Never    Active Member of Clubs or Organizations: No    Attends Banker Meetings: Never    Marital Status: Married  Catering Manager Violence: Not At Risk (05/12/2023)   Humiliation, Afraid, Rape, and Kick questionnaire    Fear of Current or Ex-Partner: No    Emotionally Abused: No    Physically Abused: No    Sexually Abused: No  Depression (PHQ2-9): Low Risk (05/12/2023)   Depression (PHQ2-9)    PHQ-2 Score: 0  Alcohol Screen: Low Risk (05/12/2023)   Alcohol Screen    Last Alcohol Screening Score (AUDIT): 0  Housing: Low Risk (05/12/2023)   Housing Stability Vital Sign    Unable to Pay for Housing in the Last Year: No    Number of Times Moved in the Last Year: 0    Homeless in the Last Year: No  Utilities: Not At Risk (05/12/2023)   [facility] Utilities    Threatened with loss of utilities: No  Health Literacy: Adequate Health Literacy (05/12/2023)   B1300 Health Literacy    Frequency of need for help with medical  instructions: Rarely    Review of Systems   General: Negative for anorexia, weight loss, fever, chills, fatigue, weakness. ENT: Negative for hoarseness, difficulty swallowing , nasal congestion. CV: Negative for chest pain, angina, palpitations, dyspnea on exertion, peripheral edema.  Respiratory: Negative for dyspnea at rest, dyspnea on exertion,+ cough, sputum, +wheezing.  GI: See history of present illness. GU:  Negative for dysuria, hematuria, urinary incontinence, urinary frequency, nocturnal urination.  Endo: Negative for unusual weight change.     Physical Exam   BP 110/64   Pulse 76   Temp 97.9 F (36.6 C) (Oral)   Ht 5' 4 (1.626 m)   Wt 159 lb 1.9 oz (72.2 kg)   SpO2 95%   BMI 27.31 kg/m    General: Well-nourished, well-developed in no acute distress.  Eyes: No icterus. Mouth: Oropharyngeal mucosa moist and pink   Lungs: bilateral wheezing Heart: Regular rate and rhythm, no murmurs rubs or gallops.  Abdomen: Bowel sounds are normal, nontender, nondistended, no hepatosplenomegaly or masses,  no abdominal bruits or hernia , no rebound or guarding.  Rectal: not performed Extremities: No lower extremity  edema. No clubbing or deformities. Neuro: Alert and oriented x 4   Skin: Warm and dry, no jaundice.   Psych: Alert and cooperative, normal mood and affect.  Labs   See above Imaging Studies   No results found.  Assessment/Plan:    Assessment & Plan Dysphagia with history of recurrent Schatzki ring and ?cervical esophageal web. Chronic dysphagia worsening, previously responsive to dilation - Scheduled upper endoscopy with esophageal dilation. ASA 3.  I have discussed the risks, alternatives, benefits with regards to but not limited to the risk of reaction to medication, bleeding, infection, perforation and the patient is agreeable to proceed. Written consent to be obtained.   Chronic abdominal pain Persistent upper abdominal pain with distension and  postprandial exacerbation  - Evaluate at time of EGD.  - chronically on celecoxib , cannot rule out gastritis/pud.  Gastroesophageal reflux disease (GERD) Well-controlled on esomeprazole ; no heartburn   - Continued daily esomeprazole .  Chronic constipation Doing well with Linzess  daily prn.  Chronically elevated alkaline phosphatase Chronic elevation; GGT previously negative. Alk phos liver fraction 57%. AMA, M2 Ab, Anti GP 210, Anti SP 100 all negative. Cannot rule out AMA negative PBC. ANA, ASMA positive. She did not see rheumatology to rule out cause for autoimmune markers, has bilateral joint pain but no swelling.  - update LFTs, IgG/IgM/IgA -last liver imaging via CT in 2024.  -keep upcoming ov with Dr. Shaaron.               Sonny RAMAN. Ezzard, MHS, PA-C [facility]  "

## 2024-01-30 NOTE — Patient Instructions (Addendum)
 Continue esomeprazole  40mg  daily before breakfast for acid reflux. Continue Linzess  290mcg daily as needed for constipation. Upper endoscopy to be scheduled.  Keep appointment with Dr. Shaaron on 03/05/2024 to follow up on liver lab concerns as previously planned.

## 2024-01-31 ENCOUNTER — Other Ambulatory Visit: Payer: Self-pay

## 2024-01-31 DIAGNOSIS — R748 Abnormal levels of other serum enzymes: Secondary | ICD-10-CM

## 2024-01-31 NOTE — Telephone Encounter (Signed)
 Pt was made aware and verbalized understanding. Labs were ordered and pt was instructed to complete as soon as possible.

## 2024-02-03 ENCOUNTER — Encounter (HOSPITAL_COMMUNITY): Payer: Self-pay

## 2024-02-03 ENCOUNTER — Other Ambulatory Visit: Payer: Self-pay

## 2024-02-03 ENCOUNTER — Encounter (HOSPITAL_COMMUNITY)
Admission: RE | Admit: 2024-02-03 | Discharge: 2024-02-03 | Disposition: A | Discharge status: Home / Self Care | Source: Ambulatory Visit | Attending: Internal Medicine | Admitting: Internal Medicine

## 2024-02-04 LAB — HEPATIC FUNCTION PANEL
ALT: 9 IU/L (ref 0–32)
AST: 9 IU/L (ref 0–40)
Albumin: 4.3 g/dL (ref 3.9–4.9)
Alkaline Phosphatase: 144 IU/L — ABNORMAL HIGH (ref 49–135)
Bilirubin Total: 0.5 mg/dL (ref 0.0–1.2)
Bilirubin, Direct: 0.18 mg/dL (ref 0.00–0.40)
Total Protein: 7.5 g/dL (ref 6.0–8.5)

## 2024-02-04 LAB — IGG, IGA, IGM
IgG (Immunoglobin G), Serum: 1219 mg/dL (ref 586–1602)
IgM (Immunoglobulin M), Srm: 57 mg/dL (ref 26–217)
Immunoglobulin A, (IgA) QN, Serum: 751 mg/dL — ABNORMAL HIGH (ref 87–352)

## 2024-02-08 ENCOUNTER — Encounter (HOSPITAL_COMMUNITY)
Admission: RE | Disposition: A | Discharge status: Home / Self Care | Payer: Self-pay | Source: Home / Self Care | Attending: Internal Medicine

## 2024-02-08 ENCOUNTER — Other Ambulatory Visit: Payer: Self-pay

## 2024-02-08 ENCOUNTER — Encounter (HOSPITAL_COMMUNITY): Payer: Self-pay | Admitting: Internal Medicine

## 2024-02-08 ENCOUNTER — Ambulatory Visit (HOSPITAL_COMMUNITY): Admitting: Anesthesiology

## 2024-02-08 ENCOUNTER — Ambulatory Visit (HOSPITAL_COMMUNITY)
Admission: RE | Admit: 2024-02-08 | Discharge: 2024-02-08 | Disposition: A | Discharge status: Home / Self Care | Attending: Internal Medicine | Admitting: Internal Medicine

## 2024-02-08 DIAGNOSIS — K3189 Other diseases of stomach and duodenum: Secondary | ICD-10-CM | POA: Diagnosis not present

## 2024-02-08 DIAGNOSIS — K2950 Unspecified chronic gastritis without bleeding: Secondary | ICD-10-CM | POA: Diagnosis not present

## 2024-02-08 DIAGNOSIS — I10 Essential (primary) hypertension: Secondary | ICD-10-CM | POA: Insufficient documentation

## 2024-02-08 DIAGNOSIS — K219 Gastro-esophageal reflux disease without esophagitis: Secondary | ICD-10-CM | POA: Diagnosis not present

## 2024-02-08 DIAGNOSIS — G8929 Other chronic pain: Secondary | ICD-10-CM | POA: Insufficient documentation

## 2024-02-08 DIAGNOSIS — R1310 Dysphagia, unspecified: Secondary | ICD-10-CM | POA: Diagnosis present

## 2024-02-08 HISTORY — PX: ESOPHAGOGASTRODUODENOSCOPY: SHX5428

## 2024-02-08 HISTORY — PX: ESOPHAGEAL DILATION: SHX303

## 2024-02-08 MED ORDER — LACTATED RINGERS IV SOLN: INTRAVENOUS | Status: DC

## 2024-02-08 MED ORDER — LIDOCAINE 2% (20 MG/ML) 5 ML SYRINGE
INTRAMUSCULAR | Status: DC | PRN
  Administered 2024-02-08: 60 mg via INTRAVENOUS

## 2024-02-08 MED ORDER — IPRATROPIUM-ALBUTEROL 0.5-2.5 (3) MG/3ML IN SOLN
RESPIRATORY_TRACT | Status: AC
  Filled 2024-02-08: qty 3

## 2024-02-08 MED ORDER — IPRATROPIUM-ALBUTEROL 0.5-2.5 (3) MG/3ML IN SOLN
3.0000 mL | Freq: Once | RESPIRATORY_TRACT | Status: AC
  Administered 2024-02-08: 3 mL via RESPIRATORY_TRACT

## 2024-02-08 MED ORDER — PROPOFOL 500 MG/50ML IV EMUL
INTRAVENOUS | Status: DC | PRN
  Administered 2024-02-08: 200 ug/kg/min via INTRAVENOUS

## 2024-02-08 MED ORDER — PROPOFOL 10 MG/ML IV BOLUS
INTRAVENOUS | Status: DC | PRN
  Administered 2024-02-08: 100 mg via INTRAVENOUS

## 2024-02-08 NOTE — Discharge Instructions (Signed)
 EGD Discharge instructions Please read the instructions outlined below and refer to this sheet in the next few weeks. These discharge instructions provide you with general information on caring for yourself after you leave the hospital. Your doctor may also give you specific instructions. While your treatment has been planned according to the most current medical practices available, unavoidable complications occasionally occur. If you have any problems or questions after discharge, please call your doctor. ACTIVITY You may resume your regular activity but move at a slower pace for the next 24 hours.  Take frequent rest periods for the next 24 hours.  Walking will help expel (get rid of) the air and reduce the bloated feeling in your abdomen.  No driving for 24 hours (because of the anesthesia (medicine) used during the test).  You may shower.  Do not sign any important legal documents or operate any machinery for 24 hours (because of the anesthesia used during the test).  NUTRITION Drink plenty of fluids.  You may resume your normal diet.  Begin with a light meal and progress to your normal diet.  Avoid alcoholic beverages for 24 hours or as instructed by your caregiver.  MEDICATIONS You may resume your normal medications unless your caregiver tells you otherwise.  WHAT YOU CAN EXPECT TODAY You may experience abdominal discomfort such as a feeling of fullness or gas pains.  FOLLOW-UP Your doctor will discuss the results of your test with you.  SEEK IMMEDIATE MEDICAL ATTENTION IF ANY OF THE FOLLOWING OCCUR: Excessive nausea (feeling sick to your stomach) and/or vomiting.  Severe abdominal pain and distention (swelling).  Trouble swallowing.  Temperature over 101 F (37.8 C).  Rectal bleeding or vomiting of blood.      your esophagus was dilated and biopsied today  The stomach was a little bit inflamed and biopsied   continue Nexium  daily.  Office visit with Sonny Kerns in 3  months   at patient request, called Dickey Lesches at [phone] -  reviewed findings and recommendations

## 2024-02-08 NOTE — Op Note (Signed)
 [facility] Patient Name: [name] Procedure Date: 02/08/2024 8:19 AM MRN: [id number] Date of Birth: 10/24/1960 Attending MD: Lamar Ozell Hollingshead , MD, [id number] CSN: [id number] Age: 64 Admit Type: Outpatient Procedure:                Upper GI endoscopy Indications:              Dysphagia Providers:                Lamar Ozell Hollingshead, MD, Madelin Hunter, RN, Dorcas Lenis, Technician Referring MD:              Medicines:                Propofol  per Anesthesia Complications:            No immediate complications. Estimated Blood Loss:     Estimated blood loss was minimal. Procedure:                Pre-Anesthesia Assessment:                           - Prior to the procedure, a History and Physical                            was performed, and patient medications and                            allergies were reviewed. The patient's tolerance of                            previous anesthesia was also reviewed. The risks                            and benefits of the procedure and the sedation                            options and risks were discussed with the patient.                            All questions were answered, and informed consent                            was obtained. Prior Anticoagulants: The patient has                            taken no anticoagulant or antiplatelet agents. ASA                            Grade Assessment: III - A patient with severe                            systemic disease. After reviewing the risks and  benefits, the patient was deemed in satisfactory                            condition to undergo the procedure.                           After obtaining informed consent, the endoscope was                            passed under direct vision. Throughout the                            procedure, the patient's blood pressure, pulse, and                            oxygen  saturations were  monitored continuously. The                            [id number]-[id number] ([id number]) scope was introduced through                            the mouth, and advanced to the second part of                            duodenum. The upper GI endoscopy was accomplished                            without difficulty. The patient tolerated the                            procedure well. Scope In: 9:03:03 AM Scope Out: 9:13:06 AM Total Procedure Duration: 0 hours 10 minutes 3 seconds  Findings:      The examined esophagus was normal. Stomach empty. Antral erosions. No       ulcer or infiltrating process. Patent pylorus.      The duodenal bulb and second portion of the duodenum were normal. The       scope was withdrawn. Dilation was performed with a Maloney dilator with       mild resistance at 56 Fr. The dilation site was examined following       endoscope reinsertion and showed no change. Estimated blood loss: none.       Finally, biopsies of the mid and distal esophagus were taken. Biopsies       of the antral mucosa were taken. Impression:               - Normal esophagus. Dilated and biopsied. Antral                            erosionsstatus post gastric biopsy.                           - Normal duodenal bulb and second portion of the                            duodenum. Moderate Sedation:  Moderate (conscious) sedation was personally administered by an       anesthesia professional. The following parameters were monitored: oxygen        saturation, heart rate, blood pressure, respiratory rate, EKG, adequacy       of pulmonary ventilation, and response to care. Recommendation:           - Patient has a contact number available for                            emergencies. The signs and symptoms of potential                            delayed complications were discussed with the                            patient. Return to normal activities tomorrow.                            Written discharge  instructions were provided to the                            patient.                           - Advance diet as tolerated.                           - Continue present medications. Office visit with                            us  in 3 months. Follow-up on pathology. If patient                            has recurrent dysphagia in the future, consider                            barium pill esophagram. Procedure Code(s):        --- Professional ---                           [phone], Esophagogastroduodenoscopy, flexible,                            transoral; diagnostic, including collection of                            specimen(s) by brushing or washing, when performed                            (separate procedure)                           43450, Dilation of esophagus, by unguided sound or                            bougie, single or multiple passes Diagnosis Code(s):        ---  Professional ---                           R13.10, Dysphagia, unspecified CPT copyright 2022 [employer]. All rights reserved. The codes documented in this report are preliminary and upon coder review may  be revised to meet current compliance requirements. Lamar HERO. Elleigh Cassetta, MD Lamar Ozell Hollingshead, MD 02/08/2024 9:23:25 AM This report has been signed electronically. Number of Addenda: 0

## 2024-02-08 NOTE — Interval H&P Note (Signed)
 History and Physical Interval Note:  02/08/2024 8:46 AM  [name]  has presented today for surgery, with the diagnosis of DYSPHAGIA, GERD, ABD PAIN.  The various methods of treatment have been discussed with the patient and family. After consideration of risks, benefits and other options for treatment, the patient has consented to  Procedures with comments: EGD (ESOPHAGOGASTRODUODENOSCOPY) (N/A) - 10:30am, asa 3 (can't do ealier bc of transportation) DILATION, ESOPHAGUS (N/A) as a surgical intervention.  The patient's history has been reviewed, patient examined, no change in status, stable for surgery.  I have reviewed the patient's chart and labs.  Questions were answered to the patient's satisfaction.     [name]   no change.    Patient here for EGD with possible esophageal dilation as feasible/appropriate for planned.  The risks, benefits, limitations, alternatives and imponderables have been reviewed with the patient. Potential for esophageal dilation, biopsy, etc. have also been reviewed.  Questions have been answered. All parties agreeable.

## 2024-02-08 NOTE — Anesthesia Preprocedure Evaluation (Addendum)
"                                    Anesthesia Evaluation  Patient identified by MRN, date of birth, ID band Patient awake    Reviewed: Allergy & Precautions, H&P , NPO status , Patient's Chart, lab work & pertinent test results  Airway Mallampati: II  TM Distance: >3 FB Neck ROM: Full    Dental  (+) Edentulous Lower, Edentulous Upper   Pulmonary shortness of breath, asthma , sleep apnea , COPD, former smoker    + wheezing      Cardiovascular hypertension, Normal cardiovascular exam+ Valvular Problems/Murmurs AI  Rhythm:Regular Rate:Normal  Moderate aortic regurgitation Nl EF   Neuro/Psych  PSYCHIATRIC DISORDERS Anxiety Depression     Neuromuscular disease    GI/Hepatic Neg liver ROS,GERD  ,,  Endo/Other  negative endocrine ROS    Renal/GU Renal disease  negative genitourinary   Musculoskeletal  (+) Arthritis ,    Abdominal   Peds negative pediatric ROS (+)  Hematology  (+) Blood dyscrasia, anemia   Anesthesia Other Findings   Reproductive/Obstetrics negative OB ROS                              Anesthesia Physical Anesthesia Plan  ASA: 3  Anesthesia Plan: General   Post-op Pain Management:    Induction: Intravenous  PONV Risk Score and Plan:   Airway Management Planned: Nasal Cannula and Natural Airway  Additional Equipment:   Intra-op Plan:   Post-operative Plan:   Informed Consent: I have reviewed the patients History and Physical, chart, labs and discussed the procedure including the risks, benefits and alternatives for the proposed anesthesia with the patient or authorized representative who has indicated his/her understanding and acceptance.     Dental advisory given  Plan Discussed with: CRNA  Anesthesia Plan Comments:          Anesthesia Quick Evaluation  "

## 2024-02-08 NOTE — Anesthesia Postprocedure Evaluation (Signed)
"   Anesthesia Post Note  Patient: [name]  Procedure(s) Performed: EGD (ESOPHAGOGASTRODUODENOSCOPY) DILATION, ESOPHAGUS  Patient location during evaluation: PACU Anesthesia Type: General Level of consciousness: awake and alert Pain management: pain level controlled Vital Signs Assessment: post-procedure vital signs reviewed and stable Respiratory status: spontaneous breathing, nonlabored ventilation, respiratory function stable and patient connected to nasal cannula oxygen  Cardiovascular status: blood pressure returned to baseline and stable Postop Assessment: no apparent nausea or vomiting Anesthetic complications: no   No notable events documented.   Last Vitals:  Vitals:   02/08/24 0919 02/08/24 0929  BP: (!) 103/40 (!) 109/51  Pulse: 78   Resp: 15   Temp: 36.6 C   SpO2: 95%     Last Pain:  Vitals:   02/08/24 0919  TempSrc: Oral  PainSc: 0-No pain                 Andrea Limes      "

## 2024-02-08 NOTE — Transfer of Care (Signed)
 Immediate Anesthesia Transfer of Care Note  Patient: [name]  Procedure(s) Performed: EGD (ESOPHAGOGASTRODUODENOSCOPY) DILATION, ESOPHAGUS  Patient Location: Short Stay  Anesthesia Type:General  Level of Consciousness: awake, alert , oriented, and patient cooperative  Airway & Oxygen  Therapy: Patient Spontanous Breathing  Post-op Assessment: Report given to RN, Post -op Vital signs reviewed and stable, and Patient moving all extremities X 4  Post vital signs: Reviewed and stable  Last Vitals:  Vitals Value Taken Time  BP 103/40 02/08/24 09:19  Temp 36.6 C 02/08/24 09:19  Pulse 78 02/08/24 09:19  Resp 15 02/08/24 09:19  SpO2 95 % 02/08/24 09:19    Last Pain:  Vitals:   02/08/24 0919  TempSrc: Oral  PainSc: 0-No pain      Patients Stated Pain Goal: 9 (02/08/24 0808)  Complications: No notable events documented.

## 2024-02-09 ENCOUNTER — Encounter (HOSPITAL_COMMUNITY): Payer: Self-pay | Admitting: Internal Medicine

## 2024-02-09 ENCOUNTER — Ambulatory Visit: Payer: Self-pay | Admitting: Internal Medicine

## 2024-02-09 LAB — SURGICAL PATHOLOGY

## 2024-02-11 ENCOUNTER — Other Ambulatory Visit: Payer: Self-pay | Admitting: Family Medicine

## 2024-02-13 ENCOUNTER — Ambulatory Visit: Payer: Self-pay | Admitting: Gastroenterology

## 2024-02-13 DIAGNOSIS — R748 Abnormal levels of other serum enzymes: Secondary | ICD-10-CM

## 2024-02-14 ENCOUNTER — Telehealth: Payer: Self-pay

## 2024-02-14 ENCOUNTER — Other Ambulatory Visit: Payer: Self-pay | Admitting: *Deleted

## 2024-02-14 DIAGNOSIS — R1319 Other dysphagia: Secondary | ICD-10-CM

## 2024-02-14 NOTE — Telephone Encounter (Signed)
 Patient calls nurse line requesting prescription for pink eye. She reports that for the last three days she has been experiencing left eye redness, irritation, draining and itchiness.   She denies flu like symptoms, rash or travel outside of [facility].   Recommended that patient schedule visit for further evaluation. She states that she does not have transportation, however, would be agreeable to virtual visit if needed.   Will forward to PCP.   Chiquita JAYSON English, RN

## 2024-02-14 NOTE — Telephone Encounter (Signed)
 See result note.

## 2024-02-14 NOTE — Telephone Encounter (Signed)
 Pt called stating that she is still having issues with food feeling like it is sticking in her chest and states that the back of her throat gets so dry until she wets it.

## 2024-02-16 ENCOUNTER — Telehealth: Admitting: Family Medicine

## 2024-02-16 ENCOUNTER — Other Ambulatory Visit: Payer: Self-pay | Admitting: Family Medicine

## 2024-02-16 DIAGNOSIS — H109 Unspecified conjunctivitis: Secondary | ICD-10-CM

## 2024-02-16 MED ORDER — OFLOXACIN 0.3 % OP SOLN: 2.0000 [drp] | Freq: Four times a day (QID) | OPHTHALMIC | 0 refills | Status: AC

## 2024-02-16 NOTE — Telephone Encounter (Signed)
 Called patient and advised of message per Dr. Elicia.   Scheduled patient mychart video visit this PM with Dr. Toma.   Chiquita JAYSON English, RN

## 2024-02-16 NOTE — Progress Notes (Signed)
 [facility] Telemedicine Visit  Patient consented to have virtual visit and was identified by name and date of birth. Method of visit: Video  Encounter participants: Patient: [name] - located at home Provider: Jaleeyah Munce - located at [facility] Others (if applicable): None  Chief Complaint: Left eye pain and discharge  HPI:  Patient has had left eye redness, mild blurriness, itching, painful, swollen for 3-4 days. Has been progressively worsening. Has had eye drainage persistently, yellow to pink in color. Eye is crusting significantly. No limitation to eye movement. Denies history of eye injury or foreign body recently. Denies fevers, nasal discharge, ear pain, no other.  ROS: per HPI  Pertinent PMHx: No T2DM or immunocompromised status  Exam:  There were no vitals taken for this visit.  Respiratory: Breathing comfortably Eyes: Left eye with significant conjunctival injection and erythema, minimal edema, but full EOMI in all directions without limitation.  Crusting of yellowish discharge visible on eyelashes.  Assessment & Plan Bacterial conjunctivitis Suspect bacterial conjunctivitis of left eye given constellation of symptoms.  No history of eye trama.  No systemic infection signs.  Low concern for preseptal cellulitis given minimal edema, no concern for orbital cellulitis given normal extraocular movement range. - Ofloxacin  ophthalmic soln 2 drops both eyes QID - Warm compresses frequently throughout the day - Strict ED precautions if worsening vision or pain, limitation in eye movement, or other concerning change - Education on possible risks and complications of eye infection provided - Close follow up, patient stated she could not come in tomorrow and appointment scheduled for 8:30 am this upcoming Monday Feb 2nd  Time spent during visit with patient: 15 minutes

## 2024-02-20 ENCOUNTER — Ambulatory Visit: Payer: Self-pay | Admitting: Student

## 2024-02-22 ENCOUNTER — Ambulatory Visit (HOSPITAL_COMMUNITY)
Admission: RE | Admit: 2024-02-22 | Discharge: 2024-02-22 | Disposition: A | Source: Ambulatory Visit | Attending: Gastroenterology | Admitting: Gastroenterology

## 2024-02-22 DIAGNOSIS — R1319 Other dysphagia: Secondary | ICD-10-CM

## 2024-02-23 LAB — MULTIPLE MYELOMA PANEL, SERUM

## 2024-02-27 ENCOUNTER — Ambulatory Visit: Admitting: Student

## 2024-03-02 ENCOUNTER — Ambulatory Visit: Admitting: "Endocrinology

## 2024-03-05 ENCOUNTER — Ambulatory Visit: Admitting: Internal Medicine

## 2024-05-02 ENCOUNTER — Ambulatory Visit: Admitting: Gastroenterology

## 2024-05-14 ENCOUNTER — Encounter
